# Patient Record
Sex: Female | Born: 1935 | Race: White | Hispanic: No | Marital: Married | Smoking: Former smoker
Health system: Southern US, Community
[De-identification: ages and names within clinical notes are randomized; demographics above are authoritative.]

## PROBLEM LIST (undated history)

## (undated) DIAGNOSIS — Z9989 Dependence on other enabling machines and devices: Secondary | ICD-10-CM

## (undated) DIAGNOSIS — G4733 Obstructive sleep apnea (adult) (pediatric): Secondary | ICD-10-CM

## (undated) DIAGNOSIS — F32A Depression, unspecified: Secondary | ICD-10-CM

## (undated) DIAGNOSIS — E039 Hypothyroidism, unspecified: Secondary | ICD-10-CM

## (undated) DIAGNOSIS — E871 Hypo-osmolality and hyponatremia: Secondary | ICD-10-CM

## (undated) DIAGNOSIS — S0230XD Fracture of orbital floor, unspecified side, subsequent encounter for fracture with routine healing: Secondary | ICD-10-CM

## (undated) DIAGNOSIS — M1990 Unspecified osteoarthritis, unspecified site: Secondary | ICD-10-CM

## (undated) DIAGNOSIS — K219 Gastro-esophageal reflux disease without esophagitis: Secondary | ICD-10-CM

## (undated) DIAGNOSIS — M75100 Unspecified rotator cuff tear or rupture of unspecified shoulder, not specified as traumatic: Secondary | ICD-10-CM

## (undated) DIAGNOSIS — I739 Peripheral vascular disease, unspecified: Secondary | ICD-10-CM

## (undated) DIAGNOSIS — H3530 Unspecified macular degeneration: Secondary | ICD-10-CM

## (undated) DIAGNOSIS — I499 Cardiac arrhythmia, unspecified: Secondary | ICD-10-CM

## (undated) DIAGNOSIS — I495 Sick sinus syndrome: Secondary | ICD-10-CM

## (undated) DIAGNOSIS — Z950 Presence of cardiac pacemaker: Secondary | ICD-10-CM

## (undated) DIAGNOSIS — I639 Cerebral infarction, unspecified: Secondary | ICD-10-CM

## (undated) DIAGNOSIS — E349 Endocrine disorder, unspecified: Secondary | ICD-10-CM

## (undated) DIAGNOSIS — R32 Unspecified urinary incontinence: Secondary | ICD-10-CM

## (undated) DIAGNOSIS — M810 Age-related osteoporosis without current pathological fracture: Secondary | ICD-10-CM

## (undated) DIAGNOSIS — Z860100 Personal history of colon polyps, unspecified: Secondary | ICD-10-CM

## (undated) DIAGNOSIS — F329 Major depressive disorder, single episode, unspecified: Secondary | ICD-10-CM

## (undated) DIAGNOSIS — I471 Supraventricular tachycardia, unspecified: Secondary | ICD-10-CM

## (undated) DIAGNOSIS — I4891 Unspecified atrial fibrillation: Secondary | ICD-10-CM

## (undated) DIAGNOSIS — S82899A Other fracture of unspecified lower leg, initial encounter for closed fracture: Secondary | ICD-10-CM

## (undated) DIAGNOSIS — R7989 Other specified abnormal findings of blood chemistry: Secondary | ICD-10-CM

## (undated) DIAGNOSIS — I10 Essential (primary) hypertension: Secondary | ICD-10-CM

## (undated) DIAGNOSIS — S62109A Fracture of unspecified carpal bone, unspecified wrist, initial encounter for closed fracture: Secondary | ICD-10-CM

## (undated) HISTORY — DX: Other fracture of unspecified lower leg, initial encounter for closed fracture: S82.899A

## (undated) HISTORY — DX: Depression, unspecified: F32.A

## (undated) HISTORY — DX: Essential (primary) hypertension: I10

## (undated) HISTORY — DX: Cerebral infarction, unspecified: I63.9

## (undated) HISTORY — DX: Sick sinus syndrome: I49.5

## (undated) HISTORY — PX: CATARACT EXTRACTION: SUR2

## (undated) HISTORY — DX: Supraventricular tachycardia, unspecified: I47.10

## (undated) HISTORY — DX: Other specified abnormal findings of blood chemistry: R79.89

## (undated) HISTORY — PX: BACK SURGERY: SHX140

## (undated) HISTORY — DX: Unspecified urinary incontinence: R32

## (undated) HISTORY — DX: Hypothyroidism, unspecified: E03.9

## (undated) HISTORY — PX: CATARACT EXTRACTION W/ INTRAOCULAR LENS  IMPLANT, BILATERAL: SHX1307

## (undated) HISTORY — DX: Endocrine disorder, unspecified: E34.9

## (undated) HISTORY — DX: Unspecified atrial fibrillation: I48.91

## (undated) HISTORY — DX: Supraventricular tachycardia: I47.1

## (undated) HISTORY — DX: Fracture of orbital floor, unspecified side, subsequent encounter for fracture with routine healing: S02.30XD

## (undated) HISTORY — DX: Unspecified osteoarthritis, unspecified site: M19.90

## (undated) HISTORY — DX: Gastro-esophageal reflux disease without esophagitis: K21.9

## (undated) HISTORY — PX: WRIST FRACTURE SURGERY: SHX121

## (undated) HISTORY — DX: Hypercalcemia: E83.52

## (undated) HISTORY — DX: Fracture of unspecified carpal bone, unspecified wrist, initial encounter for closed fracture: S62.109A

## (undated) HISTORY — PX: ESOPHAGOGASTRODUODENOSCOPY: SHX1529

## (undated) HISTORY — DX: Age-related osteoporosis without current pathological fracture: M81.0

## (undated) HISTORY — PX: COLONOSCOPY: SHX174

## (undated) HISTORY — DX: Unspecified rotator cuff tear or rupture of unspecified shoulder, not specified as traumatic: M75.100

## (undated) HISTORY — DX: Hypo-osmolality and hyponatremia: E87.1

## (undated) HISTORY — DX: Major depressive disorder, single episode, unspecified: F32.9

---

## 1968-07-22 HISTORY — PX: VAGINAL HYSTERECTOMY: SUR661

## 1999-08-11 HISTORY — PX: LAPAROSCOPIC CHOLECYSTECTOMY: SUR755

## 2010-05-23 LAB — HM MAMMOGRAPHY

## 2010-07-22 HISTORY — PX: PACEMAKER PLACEMENT: SHX43

## 2011-05-20 LAB — HM PAP SMEAR

## 2011-07-23 LAB — HM MAMMOGRAPHY

## 2011-07-23 LAB — HM PAP SMEAR: HM Pap smear: NORMAL

## 2011-07-23 LAB — HM COLONOSCOPY

## 2011-07-30 DIAGNOSIS — Z950 Presence of cardiac pacemaker: Secondary | ICD-10-CM | POA: Diagnosis not present

## 2011-07-30 DIAGNOSIS — I10 Essential (primary) hypertension: Secondary | ICD-10-CM | POA: Diagnosis not present

## 2011-07-30 DIAGNOSIS — I495 Sick sinus syndrome: Secondary | ICD-10-CM | POA: Diagnosis not present

## 2011-08-06 DIAGNOSIS — D1801 Hemangioma of skin and subcutaneous tissue: Secondary | ICD-10-CM | POA: Diagnosis not present

## 2011-08-06 DIAGNOSIS — D485 Neoplasm of uncertain behavior of skin: Secondary | ICD-10-CM | POA: Diagnosis not present

## 2011-08-06 DIAGNOSIS — L820 Inflamed seborrheic keratosis: Secondary | ICD-10-CM | POA: Diagnosis not present

## 2011-08-06 DIAGNOSIS — C44621 Squamous cell carcinoma of skin of unspecified upper limb, including shoulder: Secondary | ICD-10-CM | POA: Diagnosis not present

## 2011-08-08 DIAGNOSIS — I4891 Unspecified atrial fibrillation: Secondary | ICD-10-CM | POA: Diagnosis not present

## 2011-08-08 DIAGNOSIS — Z950 Presence of cardiac pacemaker: Secondary | ICD-10-CM | POA: Diagnosis not present

## 2011-08-13 DIAGNOSIS — C44621 Squamous cell carcinoma of skin of unspecified upper limb, including shoulder: Secondary | ICD-10-CM | POA: Diagnosis not present

## 2011-08-13 DIAGNOSIS — C4492 Squamous cell carcinoma of skin, unspecified: Secondary | ICD-10-CM | POA: Diagnosis not present

## 2011-08-15 DIAGNOSIS — N3941 Urge incontinence: Secondary | ICD-10-CM | POA: Diagnosis not present

## 2011-08-19 DIAGNOSIS — T8189XA Other complications of procedures, not elsewhere classified, initial encounter: Secondary | ICD-10-CM | POA: Diagnosis not present

## 2011-08-22 DIAGNOSIS — H35369 Drusen (degenerative) of macula, unspecified eye: Secondary | ICD-10-CM | POA: Diagnosis not present

## 2011-08-22 DIAGNOSIS — H359 Unspecified retinal disorder: Secondary | ICD-10-CM | POA: Diagnosis not present

## 2011-08-22 DIAGNOSIS — H353190 Nonexudative age-related macular degeneration, unspecified eye, stage unspecified: Secondary | ICD-10-CM | POA: Diagnosis not present

## 2011-08-27 DIAGNOSIS — I10 Essential (primary) hypertension: Secondary | ICD-10-CM | POA: Diagnosis not present

## 2011-08-27 DIAGNOSIS — I495 Sick sinus syndrome: Secondary | ICD-10-CM | POA: Diagnosis not present

## 2011-08-27 DIAGNOSIS — I4891 Unspecified atrial fibrillation: Secondary | ICD-10-CM | POA: Diagnosis not present

## 2011-09-12 DIAGNOSIS — H353290 Exudative age-related macular degeneration, unspecified eye, stage unspecified: Secondary | ICD-10-CM | POA: Diagnosis not present

## 2011-09-23 DIAGNOSIS — H353290 Exudative age-related macular degeneration, unspecified eye, stage unspecified: Secondary | ICD-10-CM | POA: Diagnosis not present

## 2011-10-24 DIAGNOSIS — H353290 Exudative age-related macular degeneration, unspecified eye, stage unspecified: Secondary | ICD-10-CM | POA: Diagnosis not present

## 2011-10-31 DIAGNOSIS — E785 Hyperlipidemia, unspecified: Secondary | ICD-10-CM | POA: Diagnosis not present

## 2011-10-31 DIAGNOSIS — Z0000 Encounter for general adult medical examination without abnormal findings: Secondary | ICD-10-CM | POA: Diagnosis not present

## 2011-10-31 DIAGNOSIS — E039 Hypothyroidism, unspecified: Secondary | ICD-10-CM | POA: Diagnosis not present

## 2011-10-31 DIAGNOSIS — I479 Paroxysmal tachycardia, unspecified: Secondary | ICD-10-CM | POA: Diagnosis not present

## 2011-10-31 DIAGNOSIS — IMO0002 Reserved for concepts with insufficient information to code with codable children: Secondary | ICD-10-CM | POA: Diagnosis not present

## 2011-10-31 DIAGNOSIS — I10 Essential (primary) hypertension: Secondary | ICD-10-CM | POA: Diagnosis not present

## 2011-10-31 DIAGNOSIS — Z79899 Other long term (current) drug therapy: Secondary | ICD-10-CM | POA: Diagnosis not present

## 2011-10-31 DIAGNOSIS — F411 Generalized anxiety disorder: Secondary | ICD-10-CM | POA: Diagnosis not present

## 2011-10-31 LAB — BASIC METABOLIC PANEL
BUN, Bld: 16
CO2, blood: 29.4
Calcium: 10.4 mg/dL
Chloride: 100 mmol/L
Creat: 0.52
GFR: 115
Glucose: 99
Potassium: 4.4 mmol/L
Sodium: 135 mmol/L — AB (ref 137–147)

## 2011-10-31 LAB — TSH: TSH: 1.72

## 2011-11-05 DIAGNOSIS — D1801 Hemangioma of skin and subcutaneous tissue: Secondary | ICD-10-CM | POA: Diagnosis not present

## 2011-11-05 DIAGNOSIS — L820 Inflamed seborrheic keratosis: Secondary | ICD-10-CM | POA: Diagnosis not present

## 2011-11-05 DIAGNOSIS — L570 Actinic keratosis: Secondary | ICD-10-CM | POA: Diagnosis not present

## 2011-11-07 DIAGNOSIS — Z950 Presence of cardiac pacemaker: Secondary | ICD-10-CM | POA: Diagnosis not present

## 2011-11-07 DIAGNOSIS — I4891 Unspecified atrial fibrillation: Secondary | ICD-10-CM | POA: Diagnosis not present

## 2011-11-08 DIAGNOSIS — I4891 Unspecified atrial fibrillation: Secondary | ICD-10-CM | POA: Diagnosis not present

## 2011-11-08 DIAGNOSIS — Z950 Presence of cardiac pacemaker: Secondary | ICD-10-CM | POA: Diagnosis not present

## 2011-12-03 DIAGNOSIS — H353290 Exudative age-related macular degeneration, unspecified eye, stage unspecified: Secondary | ICD-10-CM | POA: Diagnosis not present

## 2012-01-06 DIAGNOSIS — I4891 Unspecified atrial fibrillation: Secondary | ICD-10-CM | POA: Diagnosis not present

## 2012-01-06 DIAGNOSIS — Z950 Presence of cardiac pacemaker: Secondary | ICD-10-CM | POA: Diagnosis not present

## 2012-01-07 DIAGNOSIS — H353290 Exudative age-related macular degeneration, unspecified eye, stage unspecified: Secondary | ICD-10-CM | POA: Diagnosis not present

## 2012-01-07 DIAGNOSIS — I4891 Unspecified atrial fibrillation: Secondary | ICD-10-CM | POA: Diagnosis not present

## 2012-01-07 DIAGNOSIS — Z950 Presence of cardiac pacemaker: Secondary | ICD-10-CM | POA: Diagnosis not present

## 2012-03-09 DIAGNOSIS — M5137 Other intervertebral disc degeneration, lumbosacral region: Secondary | ICD-10-CM | POA: Diagnosis not present

## 2012-03-09 DIAGNOSIS — M999 Biomechanical lesion, unspecified: Secondary | ICD-10-CM | POA: Diagnosis not present

## 2012-03-09 DIAGNOSIS — M9981 Other biomechanical lesions of cervical region: Secondary | ICD-10-CM | POA: Diagnosis not present

## 2012-03-11 DIAGNOSIS — M5137 Other intervertebral disc degeneration, lumbosacral region: Secondary | ICD-10-CM | POA: Diagnosis not present

## 2012-03-11 DIAGNOSIS — M9981 Other biomechanical lesions of cervical region: Secondary | ICD-10-CM | POA: Diagnosis not present

## 2012-03-11 DIAGNOSIS — M999 Biomechanical lesion, unspecified: Secondary | ICD-10-CM | POA: Diagnosis not present

## 2012-03-13 DIAGNOSIS — M999 Biomechanical lesion, unspecified: Secondary | ICD-10-CM | POA: Diagnosis not present

## 2012-03-13 DIAGNOSIS — M9981 Other biomechanical lesions of cervical region: Secondary | ICD-10-CM | POA: Diagnosis not present

## 2012-03-13 DIAGNOSIS — M5137 Other intervertebral disc degeneration, lumbosacral region: Secondary | ICD-10-CM | POA: Diagnosis not present

## 2012-03-16 DIAGNOSIS — IMO0002 Reserved for concepts with insufficient information to code with codable children: Secondary | ICD-10-CM | POA: Diagnosis not present

## 2012-03-16 DIAGNOSIS — R1310 Dysphagia, unspecified: Secondary | ICD-10-CM | POA: Diagnosis not present

## 2012-03-16 DIAGNOSIS — E039 Hypothyroidism, unspecified: Secondary | ICD-10-CM | POA: Diagnosis not present

## 2012-03-16 DIAGNOSIS — M9981 Other biomechanical lesions of cervical region: Secondary | ICD-10-CM | POA: Diagnosis not present

## 2012-03-16 DIAGNOSIS — M999 Biomechanical lesion, unspecified: Secondary | ICD-10-CM | POA: Diagnosis not present

## 2012-03-16 DIAGNOSIS — M5137 Other intervertebral disc degeneration, lumbosacral region: Secondary | ICD-10-CM | POA: Diagnosis not present

## 2012-03-18 DIAGNOSIS — M999 Biomechanical lesion, unspecified: Secondary | ICD-10-CM | POA: Diagnosis not present

## 2012-03-18 DIAGNOSIS — M9981 Other biomechanical lesions of cervical region: Secondary | ICD-10-CM | POA: Diagnosis not present

## 2012-03-18 DIAGNOSIS — M5137 Other intervertebral disc degeneration, lumbosacral region: Secondary | ICD-10-CM | POA: Diagnosis not present

## 2012-03-19 DIAGNOSIS — R1310 Dysphagia, unspecified: Secondary | ICD-10-CM | POA: Diagnosis not present

## 2012-03-20 DIAGNOSIS — M9981 Other biomechanical lesions of cervical region: Secondary | ICD-10-CM | POA: Diagnosis not present

## 2012-03-20 DIAGNOSIS — M5137 Other intervertebral disc degeneration, lumbosacral region: Secondary | ICD-10-CM | POA: Diagnosis not present

## 2012-03-20 DIAGNOSIS — M999 Biomechanical lesion, unspecified: Secondary | ICD-10-CM | POA: Diagnosis not present

## 2012-03-24 DIAGNOSIS — I498 Other specified cardiac arrhythmias: Secondary | ICD-10-CM | POA: Diagnosis not present

## 2012-03-24 DIAGNOSIS — I4891 Unspecified atrial fibrillation: Secondary | ICD-10-CM | POA: Diagnosis not present

## 2012-03-24 DIAGNOSIS — I10 Essential (primary) hypertension: Secondary | ICD-10-CM | POA: Diagnosis not present

## 2012-03-26 DIAGNOSIS — M5137 Other intervertebral disc degeneration, lumbosacral region: Secondary | ICD-10-CM | POA: Diagnosis not present

## 2012-03-26 DIAGNOSIS — M48061 Spinal stenosis, lumbar region without neurogenic claudication: Secondary | ICD-10-CM | POA: Diagnosis not present

## 2012-03-26 DIAGNOSIS — M549 Dorsalgia, unspecified: Secondary | ICD-10-CM | POA: Diagnosis not present

## 2012-03-26 DIAGNOSIS — M5380 Other specified dorsopathies, site unspecified: Secondary | ICD-10-CM | POA: Diagnosis not present

## 2012-03-27 DIAGNOSIS — M9981 Other biomechanical lesions of cervical region: Secondary | ICD-10-CM | POA: Diagnosis not present

## 2012-03-27 DIAGNOSIS — M999 Biomechanical lesion, unspecified: Secondary | ICD-10-CM | POA: Diagnosis not present

## 2012-03-27 DIAGNOSIS — M5137 Other intervertebral disc degeneration, lumbosacral region: Secondary | ICD-10-CM | POA: Diagnosis not present

## 2012-03-28 DIAGNOSIS — Z23 Encounter for immunization: Secondary | ICD-10-CM | POA: Diagnosis not present

## 2012-03-31 DIAGNOSIS — H353290 Exudative age-related macular degeneration, unspecified eye, stage unspecified: Secondary | ICD-10-CM | POA: Diagnosis not present

## 2012-04-01 DIAGNOSIS — M47817 Spondylosis without myelopathy or radiculopathy, lumbosacral region: Secondary | ICD-10-CM | POA: Diagnosis not present

## 2012-04-02 DIAGNOSIS — Z86010 Personal history of colonic polyps: Secondary | ICD-10-CM | POA: Diagnosis not present

## 2012-04-02 DIAGNOSIS — R1310 Dysphagia, unspecified: Secondary | ICD-10-CM | POA: Diagnosis not present

## 2012-04-09 DIAGNOSIS — I495 Sick sinus syndrome: Secondary | ICD-10-CM | POA: Diagnosis not present

## 2012-04-09 DIAGNOSIS — Z950 Presence of cardiac pacemaker: Secondary | ICD-10-CM | POA: Diagnosis not present

## 2012-04-16 DIAGNOSIS — Z86010 Personal history of colonic polyps: Secondary | ICD-10-CM | POA: Diagnosis not present

## 2012-04-16 DIAGNOSIS — K5730 Diverticulosis of large intestine without perforation or abscess without bleeding: Secondary | ICD-10-CM | POA: Diagnosis not present

## 2012-04-16 DIAGNOSIS — I10 Essential (primary) hypertension: Secondary | ICD-10-CM | POA: Diagnosis not present

## 2012-04-16 DIAGNOSIS — D126 Benign neoplasm of colon, unspecified: Secondary | ICD-10-CM | POA: Diagnosis not present

## 2012-04-16 DIAGNOSIS — Z800 Family history of malignant neoplasm of digestive organs: Secondary | ICD-10-CM | POA: Diagnosis not present

## 2012-05-29 DIAGNOSIS — Z1231 Encounter for screening mammogram for malignant neoplasm of breast: Secondary | ICD-10-CM | POA: Diagnosis not present

## 2012-06-02 DIAGNOSIS — H353290 Exudative age-related macular degeneration, unspecified eye, stage unspecified: Secondary | ICD-10-CM | POA: Diagnosis not present

## 2012-06-09 DIAGNOSIS — I4891 Unspecified atrial fibrillation: Secondary | ICD-10-CM | POA: Diagnosis not present

## 2012-06-09 DIAGNOSIS — I10 Essential (primary) hypertension: Secondary | ICD-10-CM | POA: Diagnosis not present

## 2012-06-09 DIAGNOSIS — Z950 Presence of cardiac pacemaker: Secondary | ICD-10-CM | POA: Diagnosis not present

## 2012-06-23 DIAGNOSIS — R1310 Dysphagia, unspecified: Secondary | ICD-10-CM | POA: Diagnosis not present

## 2012-06-23 DIAGNOSIS — K222 Esophageal obstruction: Secondary | ICD-10-CM | POA: Diagnosis not present

## 2012-07-01 DIAGNOSIS — D239 Other benign neoplasm of skin, unspecified: Secondary | ICD-10-CM | POA: Diagnosis not present

## 2012-07-01 DIAGNOSIS — L570 Actinic keratosis: Secondary | ICD-10-CM | POA: Diagnosis not present

## 2012-07-01 DIAGNOSIS — D692 Other nonthrombocytopenic purpura: Secondary | ICD-10-CM | POA: Diagnosis not present

## 2012-07-01 DIAGNOSIS — L821 Other seborrheic keratosis: Secondary | ICD-10-CM | POA: Diagnosis not present

## 2012-07-01 DIAGNOSIS — D1801 Hemangioma of skin and subcutaneous tissue: Secondary | ICD-10-CM | POA: Diagnosis not present

## 2012-07-21 DIAGNOSIS — K222 Esophageal obstruction: Secondary | ICD-10-CM | POA: Diagnosis not present

## 2012-07-23 DIAGNOSIS — R5381 Other malaise: Secondary | ICD-10-CM | POA: Diagnosis not present

## 2012-07-23 DIAGNOSIS — K222 Esophageal obstruction: Secondary | ICD-10-CM | POA: Diagnosis not present

## 2012-07-23 DIAGNOSIS — Z950 Presence of cardiac pacemaker: Secondary | ICD-10-CM | POA: Diagnosis not present

## 2012-07-23 DIAGNOSIS — E039 Hypothyroidism, unspecified: Secondary | ICD-10-CM | POA: Diagnosis not present

## 2012-07-23 DIAGNOSIS — I4891 Unspecified atrial fibrillation: Secondary | ICD-10-CM | POA: Diagnosis not present

## 2012-07-23 DIAGNOSIS — R0609 Other forms of dyspnea: Secondary | ICD-10-CM | POA: Diagnosis not present

## 2012-07-23 DIAGNOSIS — Z7901 Long term (current) use of anticoagulants: Secondary | ICD-10-CM | POA: Diagnosis not present

## 2012-07-23 DIAGNOSIS — R0989 Other specified symptoms and signs involving the circulatory and respiratory systems: Secondary | ICD-10-CM | POA: Diagnosis not present

## 2012-07-23 DIAGNOSIS — IMO0002 Reserved for concepts with insufficient information to code with codable children: Secondary | ICD-10-CM | POA: Diagnosis not present

## 2012-07-23 DIAGNOSIS — I10 Essential (primary) hypertension: Secondary | ICD-10-CM | POA: Diagnosis not present

## 2012-07-23 DIAGNOSIS — I479 Paroxysmal tachycardia, unspecified: Secondary | ICD-10-CM | POA: Diagnosis not present

## 2012-07-24 DIAGNOSIS — Z13220 Encounter for screening for lipoid disorders: Secondary | ICD-10-CM | POA: Diagnosis not present

## 2012-07-24 DIAGNOSIS — Z79899 Other long term (current) drug therapy: Secondary | ICD-10-CM | POA: Diagnosis not present

## 2012-07-24 DIAGNOSIS — E039 Hypothyroidism, unspecified: Secondary | ICD-10-CM | POA: Diagnosis not present

## 2012-07-24 DIAGNOSIS — R5381 Other malaise: Secondary | ICD-10-CM | POA: Diagnosis not present

## 2012-07-24 DIAGNOSIS — I10 Essential (primary) hypertension: Secondary | ICD-10-CM | POA: Diagnosis not present

## 2012-07-24 DIAGNOSIS — Z0000 Encounter for general adult medical examination without abnormal findings: Secondary | ICD-10-CM | POA: Diagnosis not present

## 2012-07-24 LAB — CHG T CELL ABSOLUTE COUNT
Basophils Absolute: 0 /uL
Eosinophils Absolute: 1 /uL
Lymphocytes absolute: 1.1 10*3/uL (ref 0.1–1.8)
Monocyes absolute: 0.6 10*3/uL (ref 0.1–1)
Neutrophils absolute (GR#): 1.7 10*3/uL (ref 1.7–7.8)

## 2012-07-24 LAB — CBC
Hematocrit: 39.5 % (ref 34.8–46)
Hemoglobin POC: 13.4 g/dL (ref 12–15)
MCH: 33.1
MCHC: 33.9
MCV: 97.5 fL (ref 78–100)
MPV: 7.6 fL — AB (ref 7.8–11)
Platelets: 273 10*3/uL
RBC: 4.05
RDW: 12.8
WBC: 3.8

## 2012-07-24 LAB — COMPREHENSIVE METABOLIC PANEL
ALT: 16 U/L (ref 7–35)
AST: 19 U/L
Albumin: 3.8
Alkaline Phosphatase: 63 U/L
BUN, Bld: 13
Bilirubin, Total: 0.8
CO2, blood: 31.7
Calcium: 10.2 mg/dL
Chloride: 102 mmol/L
Creat: 0.47
GFR: 129
Glucose: 95
Potassium: 4.6 mmol/L
Protein S Total: 6.2
Sodium: 138 mmol/L (ref 137–147)

## 2012-07-24 LAB — CHG COMPLETE CBC & AUTO DIFF WBC
Basos: 1.3
Eos: 8.2
Lymphs: 28.8
Monocytes: 15.8
Neutrophils Relative %: 46 % (ref 46–78)
nRBC: 0 /100 (ref 0–2)

## 2012-07-24 LAB — LIPID PANEL
Chol/HDL Ratio: 3
Cholesterol: 242 mg/dL — AB (ref 0–200)
HDL: 96 mg/dL — AB (ref 35–70)
LDL Cholesterol: 135 mg/dL
Triglycerides: 59
VLDL: 12 mg/dL

## 2012-07-24 LAB — TSH: TSH: 2.5

## 2012-07-28 DIAGNOSIS — I495 Sick sinus syndrome: Secondary | ICD-10-CM | POA: Diagnosis not present

## 2012-07-28 DIAGNOSIS — Z950 Presence of cardiac pacemaker: Secondary | ICD-10-CM | POA: Diagnosis not present

## 2012-07-28 DIAGNOSIS — I10 Essential (primary) hypertension: Secondary | ICD-10-CM | POA: Diagnosis not present

## 2012-07-28 DIAGNOSIS — I4891 Unspecified atrial fibrillation: Secondary | ICD-10-CM | POA: Diagnosis not present

## 2012-07-30 DIAGNOSIS — R0609 Other forms of dyspnea: Secondary | ICD-10-CM | POA: Diagnosis not present

## 2012-07-30 DIAGNOSIS — R0989 Other specified symptoms and signs involving the circulatory and respiratory systems: Secondary | ICD-10-CM | POA: Diagnosis not present

## 2012-07-30 DIAGNOSIS — G4733 Obstructive sleep apnea (adult) (pediatric): Secondary | ICD-10-CM | POA: Diagnosis not present

## 2012-07-30 DIAGNOSIS — G4763 Sleep related bruxism: Secondary | ICD-10-CM | POA: Diagnosis not present

## 2012-07-30 DIAGNOSIS — G4730 Sleep apnea, unspecified: Secondary | ICD-10-CM | POA: Diagnosis not present

## 2012-07-30 DIAGNOSIS — R350 Frequency of micturition: Secondary | ICD-10-CM | POA: Diagnosis not present

## 2012-08-13 DIAGNOSIS — R0989 Other specified symptoms and signs involving the circulatory and respiratory systems: Secondary | ICD-10-CM | POA: Diagnosis not present

## 2012-08-13 DIAGNOSIS — G4763 Sleep related bruxism: Secondary | ICD-10-CM | POA: Diagnosis not present

## 2012-08-13 DIAGNOSIS — R0609 Other forms of dyspnea: Secondary | ICD-10-CM | POA: Diagnosis not present

## 2012-08-13 DIAGNOSIS — G4733 Obstructive sleep apnea (adult) (pediatric): Secondary | ICD-10-CM | POA: Diagnosis not present

## 2012-08-13 DIAGNOSIS — G4730 Sleep apnea, unspecified: Secondary | ICD-10-CM | POA: Diagnosis not present

## 2012-08-17 DIAGNOSIS — H353290 Exudative age-related macular degeneration, unspecified eye, stage unspecified: Secondary | ICD-10-CM | POA: Diagnosis not present

## 2012-08-19 DIAGNOSIS — G4733 Obstructive sleep apnea (adult) (pediatric): Secondary | ICD-10-CM | POA: Diagnosis not present

## 2012-10-01 DIAGNOSIS — H353290 Exudative age-related macular degeneration, unspecified eye, stage unspecified: Secondary | ICD-10-CM | POA: Diagnosis not present

## 2012-10-22 DIAGNOSIS — Z950 Presence of cardiac pacemaker: Secondary | ICD-10-CM | POA: Diagnosis not present

## 2012-10-22 DIAGNOSIS — I4891 Unspecified atrial fibrillation: Secondary | ICD-10-CM | POA: Diagnosis not present

## 2012-10-26 DIAGNOSIS — Z950 Presence of cardiac pacemaker: Secondary | ICD-10-CM | POA: Diagnosis not present

## 2012-10-26 DIAGNOSIS — I4891 Unspecified atrial fibrillation: Secondary | ICD-10-CM | POA: Diagnosis not present

## 2012-10-28 DIAGNOSIS — Z950 Presence of cardiac pacemaker: Secondary | ICD-10-CM | POA: Diagnosis not present

## 2012-10-28 DIAGNOSIS — G4730 Sleep apnea, unspecified: Secondary | ICD-10-CM | POA: Diagnosis not present

## 2012-10-28 DIAGNOSIS — I4891 Unspecified atrial fibrillation: Secondary | ICD-10-CM | POA: Diagnosis not present

## 2012-10-28 DIAGNOSIS — Z79899 Other long term (current) drug therapy: Secondary | ICD-10-CM | POA: Diagnosis not present

## 2012-10-28 DIAGNOSIS — E039 Hypothyroidism, unspecified: Secondary | ICD-10-CM | POA: Diagnosis not present

## 2012-10-28 DIAGNOSIS — IMO0002 Reserved for concepts with insufficient information to code with codable children: Secondary | ICD-10-CM | POA: Diagnosis not present

## 2012-10-28 DIAGNOSIS — E785 Hyperlipidemia, unspecified: Secondary | ICD-10-CM | POA: Diagnosis not present

## 2012-10-28 DIAGNOSIS — I10 Essential (primary) hypertension: Secondary | ICD-10-CM | POA: Diagnosis not present

## 2012-10-28 DIAGNOSIS — Z0000 Encounter for general adult medical examination without abnormal findings: Secondary | ICD-10-CM | POA: Diagnosis not present

## 2012-10-28 LAB — LIPID PANEL
Chol/HDL Ratio: 2
Chol/HDL Ratio: 2
Cholesterol: 241 mg/dL — AB (ref 0–200)
Cholesterol: 241 mg/dL — AB (ref 0–200)
HDL: 103 mg/dL — AB (ref 35–70)
HDL: 103 mg/dL — AB (ref 35–70)
LDL Cholesterol: 124 mg/dL
LDL Cholesterol: 124 mg/dL
Triglycerides: 69
Triglycerides: 69
VLDL: 14 mg/dL
VLDL: 14 mg/dL

## 2012-10-28 LAB — CHG T CELL ABSOLUTE COUNT
Basophils Absolute: 0 /uL
Basophils Absolute: 0 /uL
Eosinophils Absolute: 0 /uL
Eosinophils Absolute: 0 /uL
Lymphocytes Absolute: 1 /uL
Lymphocytes absolute: 1 10*3/uL (ref 0.1–1.8)
Monocyes absolute: 0.8 10*3/uL (ref 0.1–1)
Monocytes Absolute: 1 /uL
Neutrophils Absolute: 3 /uL
Neutrophils absolute (GR#): 2.6 10*3/uL (ref 1.7–7.8)

## 2012-10-28 LAB — COMPREHENSIVE METABOLIC PANEL
ALT: 18 U/L (ref 7–35)
ALT: 18 U/L (ref 7–35)
AST: 20 U/L
AST: 20 U/L
Albumin: 4.2
Albumin: 4.2
Alkaline Phosphatase: 79 U/L
Alkaline Phosphatase: 79 U/L
BUN, Bld: 9
BUN: 9 mg/dL (ref 4–21)
CO2, blood: 31.6
CO2, blood: 31.6
Calcium: 10.4 mg/dL
Calcium: 10.4 mg/dL
Chloride: 100 mmol/L
Chloride: 100 mmol/L (ref 99–108)
Creat: 0.46
Creat: 0.46
GFR: 132
Glucose: 105
Glucose: 105
Potassium: 3.9 mmol/L
Potassium: 3.9 mmol/L
Sodium: 137 mmol/L (ref 137–147)
Sodium: 137 mmol/L (ref 137–147)
Total Bilirubin: 1.1 mg/dL
Total Bilirubin: 1.1 mg/dL
Total Protein: 6.9 g/dL
Total Protein: 6.9 g/dL

## 2012-10-28 LAB — CBC
HCT, POC: 43.3 % (ref 37.7–47.9)
HGB: 14.5 g/dL
Hematocrit: 43.3 % (ref 34.8–46)
Hemoglobin: 14.5 g/dL (ref 11.8–15.5)
MCH: 31.7
MCH: 31.7
MCHC: 33.5
MCHC: 33.5
MCV: 94.7 fL (ref 78–100)
MCV: 94.7 fL (ref 78–100)
MPV: 7.3 fL (ref 0–99.8)
MPV: 7.3 fL — AB (ref 7.8–11)
Platelet count: 263 10*3/uL (ref 140–400)
Platelets: 263
RBC: 4.57
RBC: 4.57
RDW: 13.5
RDW: 13.5
WBC: 4.5
WBC: 4.5

## 2012-10-28 LAB — CHG GENERAL HEALTH PANEL
Basophils Absolute: 1 /uL
Eos: 3.7
GFR: 132
Lymphocytes, automated: 22
Monocytes: 16.8
Neutrophils Relative %: 57 % (ref 46–78)
nRBC: 0 /100 (ref 0–2)

## 2012-10-28 LAB — AMB REFERRAL TO INFERTILITY: TSH: 1.3

## 2012-10-28 LAB — TSH: TSH: 1.3

## 2012-11-02 DIAGNOSIS — R32 Unspecified urinary incontinence: Secondary | ICD-10-CM | POA: Diagnosis not present

## 2012-11-02 DIAGNOSIS — N952 Postmenopausal atrophic vaginitis: Secondary | ICD-10-CM | POA: Diagnosis not present

## 2012-11-03 DIAGNOSIS — M48061 Spinal stenosis, lumbar region without neurogenic claudication: Secondary | ICD-10-CM | POA: Diagnosis not present

## 2012-11-03 DIAGNOSIS — I10 Essential (primary) hypertension: Secondary | ICD-10-CM | POA: Diagnosis not present

## 2012-11-03 DIAGNOSIS — M549 Dorsalgia, unspecified: Secondary | ICD-10-CM | POA: Diagnosis not present

## 2012-11-03 DIAGNOSIS — R229 Localized swelling, mass and lump, unspecified: Secondary | ICD-10-CM | POA: Diagnosis not present

## 2012-11-03 DIAGNOSIS — M5380 Other specified dorsopathies, site unspecified: Secondary | ICD-10-CM | POA: Diagnosis not present

## 2012-11-03 DIAGNOSIS — D2120 Benign neoplasm of connective and other soft tissue of unspecified lower limb, including hip: Secondary | ICD-10-CM | POA: Diagnosis not present

## 2012-11-03 DIAGNOSIS — M799 Soft tissue disorder, unspecified: Secondary | ICD-10-CM | POA: Diagnosis not present

## 2012-11-03 DIAGNOSIS — I2789 Other specified pulmonary heart diseases: Secondary | ICD-10-CM | POA: Diagnosis not present

## 2012-11-03 DIAGNOSIS — Z01811 Encounter for preprocedural respiratory examination: Secondary | ICD-10-CM | POA: Diagnosis not present

## 2012-11-03 DIAGNOSIS — I4891 Unspecified atrial fibrillation: Secondary | ICD-10-CM | POA: Diagnosis not present

## 2012-11-03 DIAGNOSIS — M5137 Other intervertebral disc degeneration, lumbosacral region: Secondary | ICD-10-CM | POA: Diagnosis not present

## 2012-11-03 DIAGNOSIS — I2510 Atherosclerotic heart disease of native coronary artery without angina pectoris: Secondary | ICD-10-CM | POA: Diagnosis not present

## 2012-11-04 DIAGNOSIS — D2120 Benign neoplasm of connective and other soft tissue of unspecified lower limb, including hip: Secondary | ICD-10-CM | POA: Diagnosis not present

## 2012-11-04 DIAGNOSIS — I4891 Unspecified atrial fibrillation: Secondary | ICD-10-CM | POA: Diagnosis not present

## 2012-11-04 DIAGNOSIS — R229 Localized swelling, mass and lump, unspecified: Secondary | ICD-10-CM | POA: Diagnosis not present

## 2012-11-04 DIAGNOSIS — M799 Soft tissue disorder, unspecified: Secondary | ICD-10-CM | POA: Diagnosis not present

## 2012-11-04 DIAGNOSIS — I10 Essential (primary) hypertension: Secondary | ICD-10-CM | POA: Diagnosis not present

## 2012-11-10 DIAGNOSIS — Z950 Presence of cardiac pacemaker: Secondary | ICD-10-CM | POA: Diagnosis not present

## 2012-11-10 DIAGNOSIS — I495 Sick sinus syndrome: Secondary | ICD-10-CM | POA: Diagnosis not present

## 2012-11-10 DIAGNOSIS — I10 Essential (primary) hypertension: Secondary | ICD-10-CM | POA: Diagnosis not present

## 2012-11-25 DIAGNOSIS — M47817 Spondylosis without myelopathy or radiculopathy, lumbosacral region: Secondary | ICD-10-CM | POA: Diagnosis not present

## 2012-11-26 DIAGNOSIS — H353290 Exudative age-related macular degeneration, unspecified eye, stage unspecified: Secondary | ICD-10-CM | POA: Diagnosis not present

## 2012-11-27 DIAGNOSIS — K222 Esophageal obstruction: Secondary | ICD-10-CM | POA: Diagnosis not present

## 2012-11-27 DIAGNOSIS — I10 Essential (primary) hypertension: Secondary | ICD-10-CM | POA: Diagnosis not present

## 2012-11-27 DIAGNOSIS — K2940 Chronic atrophic gastritis without bleeding: Secondary | ICD-10-CM | POA: Diagnosis not present

## 2012-11-27 DIAGNOSIS — K319 Disease of stomach and duodenum, unspecified: Secondary | ICD-10-CM | POA: Diagnosis not present

## 2012-11-27 DIAGNOSIS — K209 Esophagitis, unspecified without bleeding: Secondary | ICD-10-CM | POA: Diagnosis not present

## 2012-11-27 DIAGNOSIS — R1310 Dysphagia, unspecified: Secondary | ICD-10-CM | POA: Diagnosis not present

## 2012-11-27 DIAGNOSIS — K228 Other specified diseases of esophagus: Secondary | ICD-10-CM | POA: Diagnosis not present

## 2012-11-27 DIAGNOSIS — K2289 Other specified disease of esophagus: Secondary | ICD-10-CM | POA: Diagnosis not present

## 2012-11-30 DIAGNOSIS — Z111 Encounter for screening for respiratory tuberculosis: Secondary | ICD-10-CM | POA: Diagnosis not present

## 2013-01-01 DIAGNOSIS — Y92009 Unspecified place in unspecified non-institutional (private) residence as the place of occurrence of the external cause: Secondary | ICD-10-CM | POA: Diagnosis not present

## 2013-01-01 DIAGNOSIS — W19XXXA Unspecified fall, initial encounter: Secondary | ICD-10-CM | POA: Diagnosis not present

## 2013-01-01 DIAGNOSIS — S8000XA Contusion of unspecified knee, initial encounter: Secondary | ICD-10-CM | POA: Diagnosis not present

## 2013-01-01 DIAGNOSIS — IMO0002 Reserved for concepts with insufficient information to code with codable children: Secondary | ICD-10-CM | POA: Diagnosis not present

## 2013-01-13 DIAGNOSIS — M654 Radial styloid tenosynovitis [de Quervain]: Secondary | ICD-10-CM | POA: Diagnosis not present

## 2013-01-21 DIAGNOSIS — S59909A Unspecified injury of unspecified elbow, initial encounter: Secondary | ICD-10-CM | POA: Diagnosis not present

## 2013-01-21 DIAGNOSIS — S6990XA Unspecified injury of unspecified wrist, hand and finger(s), initial encounter: Secondary | ICD-10-CM | POA: Diagnosis not present

## 2013-01-21 DIAGNOSIS — S63509A Unspecified sprain of unspecified wrist, initial encounter: Secondary | ICD-10-CM | POA: Diagnosis not present

## 2013-02-23 ENCOUNTER — Encounter: Payer: Self-pay | Admitting: Nurse Practitioner

## 2013-02-23 ENCOUNTER — Other Ambulatory Visit: Payer: Self-pay | Admitting: Nurse Practitioner

## 2013-02-23 ENCOUNTER — Ambulatory Visit (INDEPENDENT_AMBULATORY_CARE_PROVIDER_SITE_OTHER): Payer: Medicare Other | Admitting: Nurse Practitioner

## 2013-02-23 VITALS — BP 112/60 | HR 79 | Temp 98.0°F | Ht 66.17 in | Wt 157.8 lb

## 2013-02-23 DIAGNOSIS — E2839 Other primary ovarian failure: Secondary | ICD-10-CM

## 2013-02-23 DIAGNOSIS — I10 Essential (primary) hypertension: Secondary | ICD-10-CM

## 2013-02-23 DIAGNOSIS — F32A Depression, unspecified: Secondary | ICD-10-CM | POA: Insufficient documentation

## 2013-02-23 DIAGNOSIS — K219 Gastro-esophageal reflux disease without esophagitis: Secondary | ICD-10-CM

## 2013-02-23 DIAGNOSIS — F329 Major depressive disorder, single episode, unspecified: Secondary | ICD-10-CM

## 2013-02-23 DIAGNOSIS — F419 Anxiety disorder, unspecified: Secondary | ICD-10-CM | POA: Insufficient documentation

## 2013-02-23 DIAGNOSIS — Z9989 Dependence on other enabling machines and devices: Secondary | ICD-10-CM | POA: Insufficient documentation

## 2013-02-23 DIAGNOSIS — Z780 Asymptomatic menopausal state: Secondary | ICD-10-CM

## 2013-02-23 DIAGNOSIS — E039 Hypothyroidism, unspecified: Secondary | ICD-10-CM | POA: Insufficient documentation

## 2013-02-23 DIAGNOSIS — Z0000 Encounter for general adult medical examination without abnormal findings: Secondary | ICD-10-CM

## 2013-02-23 DIAGNOSIS — K2950 Unspecified chronic gastritis without bleeding: Secondary | ICD-10-CM | POA: Insufficient documentation

## 2013-02-23 DIAGNOSIS — R32 Unspecified urinary incontinence: Secondary | ICD-10-CM

## 2013-02-23 DIAGNOSIS — G4733 Obstructive sleep apnea (adult) (pediatric): Secondary | ICD-10-CM

## 2013-02-23 LAB — HEMOGLOBIN A1C: Hgb A1c MFr Bld: 5.1 % (ref 4.6–6.5)

## 2013-02-23 NOTE — Progress Notes (Signed)
Subjective:     Brittany [name] is a 79 y.o. female and is here for a comprehensive physical exam. She and her husband recently relocated to [facility] from [place]. They are retired and live at [facility] [name]. Currently she is treated for GERD, HTN, Depressive disorder, urinary incontinence, OSA, macular degenerative disease, and hypothyroidism. She had a pacemaker implanted for treatment of atrial fibrillation and plans to establish with Dr. Clide [name] . She is up to date on vaccines; she had a colon cancer screening in 2013 and no f/u was recommended. She had a mammogram 11/13 and bone density scan about 6 years ago, per pt report. She has no complaints today, but requests prescription for CPAP-will request records of last sleep study.  History   Social History  . Marital Status: Married    Spouse Name: N/A    Number of Children: 4  . Years of Education: 12   Occupational History  . Personell Manager      retired   Social History Main Topics  . Smoking status: Former Smoker    Quit date: 02/23/1993  . Smokeless tobacco: Never Used  . Alcohol Use: Yes  . Drug Use: No  . Sexually Active: Not on file   Other Topics Concern  . Not on file   Social History Narrative   [name] lives with her husband at [facility] [name]. They are retired and recently relocated to [facility] from [place]. She has 4 grown children.   No health maintenance topics applied.  The following portions of the patient's history were reviewed and updated as appropriate: allergies, current medications, past family history, past medical history, past social history, past surgical history and problem list.  Review of Systems Constitutional: negative for anorexia, chills, fatigue, fevers and night sweats Eyes: current treatment for macular degenerative disease-last eye exam 1 week ago  Ears, nose, mouth, throat, and face: negative for earaches, hearing loss, nasal congestion, sore throat and voice change Respiratory: negative for asthma,  cough, pleurisy/chest pain, pneumonia, sputum and wheezing Cardiovascular: negative for chest pain, fatigue, lower extremity edema, syncope and pt has pacemaker for atrial fibrillation Gastrointestinal: positive for constipation and takes senekot daily Genitourinary:positive for urinary incontinence Integument/breast: positive for multiple skin tears and bruising Musculoskeletal:negative for arthralgias, muscle weakness, myalgias and stiff joints Behavioral/Psych: positive for anxiety and states sometimes there is family stress, and citalopram helps her stay calm. Endocrine: negative for diabetic symptoms including polydipsia, polyphagia and polyuria and temperature intolerance Allergic/Immunologic: negative for hay fever and urticaria   Objective:    BP 112/60  Pulse 79  Temp(Src) 98 F (36.7 C) (Oral)  Ht 5' 6.17" (1.681 m)  Wt 157 lb 12 oz (71.555 kg)  BMI 25.32 kg/m2  SpO2 95%  LMP 07/22/1968 General appearance: alert, cooperative, appears stated age and no distress Head: Normocephalic, without obvious abnormality, atraumatic Eyes: negative findings: lids and lashes normal, conjunctivae and sclerae normal, corneas clear and pupils equal, round, reactive to light and accomodation Ears: normal TM's and external ear canals both ears Nose: Nares normal. Septum midline. Mucosa normal. No drainage or sinus tenderness. Throat: lips, mucosa, and tongue normal; teeth and gums normal Lungs: clear to auscultation bilaterally Heart: regular rate and rhythm, S1, S2 normal, no murmur, click, rub or gallop Abdomen: soft, non-tender; bowel sounds normal; no masses,  no organomegaly Extremities: extremities normal, atraumatic, no cyanosis or edema and she has several skin tears and ecchymosis consistent with thinned epidermis Pulses: 2+ and symmetric Skin: atrophy - generalized, ecchymoses -  scattered, arm(s) bilateral, lower leg(s) bilateral and skin tears. no signs of infection. Lymph nodes:  Cervical, supraclavicular, and axillary nodes normal. and thickened area R sternoclavicular notch, soft, puffy. Neurologic: Grossly normal    Assessment:   1 preventive care-vaccines UTD, MMG UTD, coloscopy-few polyps no f/u recommended, will schedule bone density scan, exercises regularly 2 Sleep apnea-historical Dx, uses CPAP 3 HTN well controlled. Meds:benazepril HCTZ 4 hypothyroid taking synthroid, no current symptoms. Last TSH 4/14 1.30 5 GERD taking omeprazole. No current symptoms 6 mood disorder taking citalopram. Enjoys hobbies, sleeping well. 7 atrial fib-pacemaker & sotalol, plans to establish w.Dr. Clide [name] this month. 8 urinary incontinence- taking desmopressin & solifenacin   Plan:  1 vit D, UA, HgA1C today. CBC, renal & liver in 9 mos. 2 ordered CPAP, requested last sleep study 3 Continue current meds 4 TSH today 5 Continue meds 6 Continue Meds 7 Monitor for Dr. Wilmon [name] notes 8 Continue meds    See After Visit Summary for Counseling Recommendations

## 2013-02-23 NOTE — Patient Instructions (Signed)
We will have flu shots in September. You look great! Pleasure to meet you!  Preventive Care for Adults, Female A healthy lifestyle and preventive care can promote health and wellness. Preventive health guidelines for women include the following key practices.  A routine yearly physical is a good way to check with your caregiver about your health and preventive screening. It is a chance to share any concerns and updates on your health, and to receive a thorough exam.  Visit your dentist for a routine exam and preventive care every 6 months. Brush your teeth twice a day and floss once a day. Good oral hygiene prevents tooth decay and gum disease.  The frequency of eye exams is based on your age, health, family medical history, use of contact lenses, and other factors. Follow your caregiver's recommendations for frequency of eye exams.  Eat a healthy diet. Foods like vegetables, fruits, whole grains, low-fat dairy products, and lean protein foods contain the nutrients you need without too many calories. Decrease your intake of foods high in solid fats, added sugars, and salt. Eat the right amount of calories for you.Get information about a proper diet from your caregiver, if necessary.  Regular physical exercise is one of the most important things you can do for your health. Most adults should get at least 150 minutes of moderate-intensity exercise (any activity that increases your heart rate and causes you to sweat) each week. In addition, most adults need muscle-strengthening exercises on 2 or more days a week.  Maintain a healthy weight. The body mass index (BMI) is a screening tool to identify possible weight problems. It provides an estimate of body fat based on height and weight. Your caregiver can help determine your BMI, and can help you achieve or maintain a healthy weight.For adults 20 years and older:  A BMI below 18.5 is considered underweight.  A BMI of 18.5 to 24.9 is normal.  A BMI  of 25 to 29.9 is considered overweight.  A BMI of 30 and above is considered obese.  Maintain normal blood lipids and cholesterol levels by exercising and minimizing your intake of saturated fat. Eat a balanced diet with plenty of fruit and vegetables. Blood tests for lipids and cholesterol should begin at age 68 and be repeated every 5 years. If your lipid or cholesterol levels are high, you are over 50, or you are at high risk for heart disease, you may need your cholesterol levels checked more frequently.Ongoing high lipid and cholesterol levels should be treated with medicines if diet and exercise are not effective.  If you smoke, find out from your caregiver how to quit. If you do not use tobacco, do not start.  If you are pregnant, do not drink alcohol. If you are breastfeeding, be very cautious about drinking alcohol. If you are not pregnant and choose to drink alcohol, do not exceed 1 drink per day. One drink is considered to be 12 ounces (355 mL) of beer, 5 ounces (148 mL) of wine, or 1.5 ounces (44 mL) of liquor.  Avoid use of street drugs. Do not share needles with anyone. Ask for help if you need support or instructions about stopping the use of drugs.  High blood pressure causes heart disease and increases the risk of stroke. Your blood pressure should be checked at least every 1 to 2 years. Ongoing high blood pressure should be treated with medicines if weight loss and exercise are not effective.  If you are 55 to 77  years old, ask your caregiver if you should take aspirin to prevent strokes.  Diabetes screening involves taking a blood sample to check your fasting blood sugar level. This should be done once every 3 years, after age 55, if you are within normal weight and without risk factors for diabetes. Testing should be considered at a younger age or be carried out more frequently if you are overweight and have at least 1 risk factor for diabetes.  Breast cancer screening is  essential preventive care for women. You should practice "breast self-awareness." This means understanding the normal appearance and feel of your breasts and may include breast self-examination. Any changes detected, no matter how small, should be reported to a caregiver. Women in their 76s and 30s should have a clinical breast exam (CBE) by a caregiver as part of a regular health exam every 1 to 3 years. After age 17, women should have a CBE every year. Starting at age 41, women should consider having a mammography (breast X-ray test) every year. Women who have a family history of breast cancer should talk to their caregiver about genetic screening. Women at a high risk of breast cancer should talk to their caregivers about having magnetic resonance imaging (MRI) and a mammography every year.  The Pap test is a screening test for cervical cancer. A Pap test can show cell changes on the cervix that might become cervical cancer if left untreated. A Pap test is a procedure in which cells are obtained and examined from the lower end of the uterus (cervix).  Women should have a Pap test starting at age 74.  Between ages 53 and 57, Pap tests should be repeated every 2 years.  Beginning at age 67, you should have a Pap test every 3 years as long as the past 3 Pap tests have been normal.  Some women have medical problems that increase the chance of getting cervical cancer. Talk to your caregiver about these problems. It is especially important to talk to your caregiver if a new problem develops soon after your last Pap test. In these cases, your caregiver may recommend more frequent screening and Pap tests.  The above recommendations are the same for women who have or have not gotten the vaccine for human papillomavirus (HPV).  If you had a hysterectomy for a problem that was not cancer or a condition that could lead to cancer, then you no longer need Pap tests. Even if you no longer need a Pap test, a  regular exam is a good idea to make sure no other problems are starting.  If you are between ages 107 and 38, and you have had normal Pap tests going back 10 years, you no longer need Pap tests. Even if you no longer need a Pap test, a regular exam is a good idea to make sure no other problems are starting.  If you have had past treatment for cervical cancer or a condition that could lead to cancer, you need Pap tests and screening for cancer for at least 20 years after your treatment.  If Pap tests have been discontinued, risk factors (such as a new sexual partner) need to be reassessed to determine if screening should be resumed.  The HPV test is an additional test that may be used for cervical cancer screening. The HPV test looks for the virus that can cause the cell changes on the cervix. The cells collected during the Pap test can be tested for HPV. The  HPV test could be used to screen women aged 51 years and older, and should be used in women of any age who have unclear Pap test results. After the age of 30, women should have HPV testing at the same frequency as a Pap test.  Colorectal cancer can be detected and often prevented. Most routine colorectal cancer screening begins at the age of 33 and continues through age 50. However, your caregiver may recommend screening at an earlier age if you have risk factors for colon cancer. On a yearly basis, your caregiver may provide home test kits to check for hidden blood in the stool. Use of a small camera at the end of a tube, to directly examine the colon (sigmoidoscopy or colonoscopy), can detect the earliest forms of colorectal cancer. Talk to your caregiver about this at age 55, when routine screening begins. Direct examination of the colon should be repeated every 5 to 10 years through age 30, unless early forms of pre-cancerous polyps or small growths are found.  Hepatitis C blood testing is recommended for all people born from [birth year] through 1965  and any individual with known risks for hepatitis C.  Practice safe sex. Use condoms and avoid high-risk sexual practices to reduce the spread of sexually transmitted infections (STIs). STIs include gonorrhea, chlamydia, syphilis, trichomonas, herpes, HPV, and human immunodeficiency virus (HIV). Herpes, HIV, and HPV are viral illnesses that have no cure. They can result in disability, cancer, and death. Sexually active women aged 9 and younger should be checked for chlamydia. Older women with new or multiple partners should also be tested for chlamydia. Testing for other STIs is recommended if you are sexually active and at increased risk.  Osteoporosis is a disease in which the bones lose minerals and strength with aging. This can result in serious bone fractures. The risk of osteoporosis can be identified using a bone density scan. Women ages 48 and over and women at risk for fractures or osteoporosis should discuss screening with their caregivers. Ask your caregiver whether you should take a calcium supplement or vitamin D to reduce the rate of osteoporosis.  Menopause can be associated with physical symptoms and risks. Hormone replacement therapy is available to decrease symptoms and risks. You should talk to your caregiver about whether hormone replacement therapy is right for you.  Use sunscreen with sun protection factor (SPF) of 30 or more. Apply sunscreen liberally and repeatedly throughout the day. You should seek shade when your shadow is shorter than you. Protect yourself by wearing long sleeves, pants, a wide-brimmed hat, and sunglasses year round, whenever you are outdoors.  Once a month, do a whole body skin exam, using a mirror to look at the skin on your back. Notify your caregiver of new moles, moles that have irregular borders, moles that are larger than a pencil eraser, or moles that have changed in shape or color.  Stay current with required immunizations.  Influenza. You need a  dose every fall (or winter). The composition of the flu vaccine changes each year, so being vaccinated once is not enough.  Pneumococcal polysaccharide. You need 1 to 2 doses if you smoke cigarettes or if you have certain chronic medical conditions. You need 1 dose at age 37 (or older) if you have never been vaccinated.  Tetanus, diphtheria, pertussis (Tdap, Td). Get 1 dose of Tdap vaccine if you are younger than age 43, are over 35 and have contact with an infant, are a Research scientist (physical sciences), are pregnant,  or simply want to be protected from whooping cough. After that, you need a Td booster dose every 10 years. Consult your caregiver if you have not had at least 3 tetanus and diphtheria-containing shots sometime in your life or have a deep or dirty wound.  HPV. You need this vaccine if you are a woman age 104 or younger. The vaccine is given in 3 doses over 6 months.  Measles, mumps, rubella (MMR). You need at least 1 dose of MMR if you were born in 1957 or later. You may also need a second dose.  Meningococcal. If you are age 85 to 50 and a first-year college student living in a residence hall, or have one of several medical conditions, you need to get vaccinated against meningococcal disease. You may also need additional booster doses.  Zoster (shingles). If you are age 16 or older, you should get this vaccine.  Varicella (chickenpox). If you have never had chickenpox or you were vaccinated but received only 1 dose, talk to your caregiver to find out if you need this vaccine.  Hepatitis A. You need this vaccine if you have a specific risk factor for hepatitis A virus infection or you simply wish to be protected from this disease. The vaccine is usually given as 2 doses, 6 to 18 months apart.  Hepatitis B. You need this vaccine if you have a specific risk factor for hepatitis B virus infection or you simply wish to be protected from this disease. The vaccine is given in 3 doses, usually over 6  months. Preventive Services / Frequency Ages 59 to 9  Blood pressure check.** / Every 1 to 2 years.  Lipid and cholesterol check.** / Every 5 years beginning at age 85.  Clinical breast exam.** / Every 3 years for women in their 62s and 72s.  Pap test.** / Every 2 years from ages 66 through 21. Every 3 years starting at age 1 through age 29 or 47 with a history of 3 consecutive normal Pap tests.  HPV screening.** / Every 3 years from ages 86 through ages 56 to 40 with a history of 3 consecutive normal Pap tests.  Hepatitis C blood test.** / For any individual with known risks for hepatitis C.  Skin self-exam. / Monthly.  Influenza immunization.** / Every year.  Pneumococcal polysaccharide immunization.** / 1 to 2 doses if you smoke cigarettes or if you have certain chronic medical conditions.  Tetanus, diphtheria, pertussis (Tdap, Td) immunization. / A one-time dose of Tdap vaccine. After that, you need a Td booster dose every 10 years.  HPV immunization. / 3 doses over 6 months, if you are 59 and younger.  Measles, mumps, rubella (MMR) immunization. / You need at least 1 dose of MMR if you were born in 1957 or later. You may also need a second dose.  Meningococcal immunization. / 1 dose if you are age 40 to 62 and a first-year college student living in a residence hall, or have one of several medical conditions, you need to get vaccinated against meningococcal disease. You may also need additional booster doses.  Varicella immunization.** / Consult your caregiver.  Hepatitis A immunization.** / Consult your caregiver. 2 doses, 6 to 18 months apart.  Hepatitis B immunization.** / Consult your caregiver. 3 doses usually over 6 months. Ages 51 to 57  Blood pressure check.** / Every 1 to 2 years.  Lipid and cholesterol check.** / Every 5 years beginning at age 55.  Clinical breast exam.** /  Every year after age 41.  Mammogram.** / Every year beginning at age 66 and  continuing for as long as you are in good health. Consult with your caregiver.  Pap test.** / Every 3 years starting at age 22 through age 57 or 24 with a history of 3 consecutive normal Pap tests.  HPV screening.** / Every 3 years from ages 32 through ages 52 to 31 with a history of 3 consecutive normal Pap tests.  Fecal occult blood test (FOBT) of stool. / Every year beginning at age 9 and continuing until age 12. You may not need to do this test if you get a colonoscopy every 10 years.  Flexible sigmoidoscopy or colonoscopy.** / Every 5 years for a flexible sigmoidoscopy or every 10 years for a colonoscopy beginning at age 1 and continuing until age 86.  Hepatitis C blood test.** / For all people born from [birth year] through 1963 and any individual with known risks for hepatitis C.  Skin self-exam. / Monthly.  Influenza immunization.** / Every year.  Pneumococcal polysaccharide immunization.** / 1 to 2 doses if you smoke cigarettes or if you have certain chronic medical conditions.  Tetanus, diphtheria, pertussis (Tdap, Td) immunization.** / A one-time dose of Tdap vaccine. After that, you need a Td booster dose every 10 years.  Measles, mumps, rubella (MMR) immunization. / You need at least 1 dose of MMR if you were born in 1957 or later. You may also need a second dose.  Varicella immunization.** / Consult your caregiver.  Meningococcal immunization.** / Consult your caregiver.  Hepatitis A immunization.** / Consult your caregiver. 2 doses, 6 to 18 months apart.  Hepatitis B immunization.** / Consult your caregiver. 3 doses, usually over 6 months. Ages 63 and over  Blood pressure check.** / Every 1 to 2 years.  Lipid and cholesterol check.** / Every 5 years beginning at age 29.  Clinical breast exam.** / Every year after age 1.  Mammogram.** / Every year beginning at age 64 and continuing for as long as you are in good health. Consult with your caregiver.  Pap test.** / Every  3 years starting at age 1 through age 38 or 25 with a 3 consecutive normal Pap tests. Testing can be stopped between 65 and 70 with 3 consecutive normal Pap tests and no abnormal Pap or HPV tests in the past 10 years.  HPV screening.** / Every 3 years from ages 34 through ages 20 or 61 with a history of 3 consecutive normal Pap tests. Testing can be stopped between 65 and 70 with 3 consecutive normal Pap tests and no abnormal Pap or HPV tests in the past 10 years.  Fecal occult blood test (FOBT) of stool. / Every year beginning at age 37 and continuing until age 28. You may not need to do this test if you get a colonoscopy every 10 years.  Flexible sigmoidoscopy or colonoscopy.** / Every 5 years for a flexible sigmoidoscopy or every 10 years for a colonoscopy beginning at age 1 and continuing until age 79.  Hepatitis C blood test.** / For all people born from [birth year] through 1963 and any individual with known risks for hepatitis C.  Osteoporosis screening.** / A one-time screening for women ages 89 and over and women at risk for fractures or osteoporosis.  Skin self-exam. / Monthly.  Influenza immunization.** / Every year.  Pneumococcal polysaccharide immunization.** / 1 dose at age 77 (or older) if you have never been vaccinated.  Tetanus, diphtheria, pertussis (  Tdap, Td) immunization. / A one-time dose of Tdap vaccine if you are over 65 and have contact with an infant, are a Dietitian, or simply want to be protected from whooping cough. After that, you need a Td booster dose every 10 years.  Varicella immunization.** / Consult your caregiver.  Meningococcal immunization.** / Consult your caregiver.  Hepatitis A immunization.** / Consult your caregiver. 2 doses, 6 to 18 months apart.  Hepatitis B immunization.** / Check with your caregiver. 3 doses, usually over 6 months. ** Family history and personal history of risk and conditions may change your caregiver's  recommendations. Document Released: 09/03/2001 Document Revised: 09/30/2011 Document Reviewed: 12/03/2010 [facility] Patient Information 2014 [place], [place].

## 2013-02-23 NOTE — Addendum Note (Signed)
Addended by: Alben Spittle, Konrad Felix COX on: 02/23/2013 05:06 PM   Modules accepted: Orders

## 2013-02-23 NOTE — Addendum Note (Signed)
Addended by: Baldemar Lenis R on: 02/23/2013 05:02 PM   Modules accepted: Orders

## 2013-02-24 ENCOUNTER — Encounter: Payer: Self-pay | Admitting: *Deleted

## 2013-02-24 ENCOUNTER — Telehealth: Payer: Self-pay | Admitting: Nurse Practitioner

## 2013-02-24 LAB — URINALYSIS, ROUTINE W REFLEX MICROSCOPIC
Bilirubin Urine: NEGATIVE
Glucose, UA: NEGATIVE mg/dL
Hgb urine dipstick: NEGATIVE
Ketones, ur: NEGATIVE mg/dL
Nitrite: NEGATIVE
Protein, ur: NEGATIVE mg/dL
Specific Gravity, Urine: 1.017 (ref 1.005–1.030)
Urobilinogen, UA: 0.2 mg/dL (ref 0.0–1.0)
pH: 6 (ref 5.0–8.0)

## 2013-02-24 LAB — VITAMIN D 25 HYDROXY (VIT D DEFICIENCY, FRACTURES): Vit D, 25-Hydroxy: 37 ng/mL (ref 30–89)

## 2013-02-24 LAB — TSH: TSH: 1.73 u[IU]/mL (ref 0.35–5.50)

## 2013-02-24 LAB — URINALYSIS, MICROSCOPIC ONLY
Casts: NONE SEEN
Squamous Epithelial / LPF: NONE SEEN

## 2013-02-24 NOTE — Telephone Encounter (Signed)
Per labs, no diabetes, TSH normal range-no change in synthroid. Urine shows calcium oxalate crystals-no Hx of kidney stones. Vit D level low, recommend 5000iu capsule daily. Discussed w/pt. We need records from historical provider for sleep study. (Her insurance carrier changed when she moved to [facility] & respiratory company does not take new ins.)

## 2013-02-25 ENCOUNTER — Telehealth: Payer: Self-pay | Admitting: *Deleted

## 2013-02-25 NOTE — Telephone Encounter (Signed)
Called patient to notify her that a referral for cpap supplies has been placed.

## 2013-03-02 ENCOUNTER — Telehealth: Payer: Self-pay | Admitting: *Deleted

## 2013-03-02 ENCOUNTER — Encounter: Payer: Self-pay | Admitting: *Deleted

## 2013-03-02 ENCOUNTER — Ambulatory Visit
Admission: RE | Admit: 2013-03-02 | Discharge: 2013-03-02 | Disposition: A | Discharge status: Home / Self Care | Payer: Medicare Other | Source: Ambulatory Visit | Attending: Nurse Practitioner | Admitting: Nurse Practitioner

## 2013-03-02 DIAGNOSIS — E2839 Other primary ovarian failure: Secondary | ICD-10-CM

## 2013-03-02 NOTE — Telephone Encounter (Signed)
Bone density scan shows T score -1.2, moderate risk for fracture using Frax. Attempted to call pt to discuss, no answer, no voice mail. Will try again. Will advise to limit alcohol to 1-2 drinks daily, limit caffeine to 1-2 cups. Continue calcium & vit D supplement.

## 2013-03-02 NOTE — Telephone Encounter (Signed)
Patient left vm concerning CPAP order. Returned patient's call, LMOVM.

## 2013-03-02 NOTE — Telephone Encounter (Signed)
Patient called to say she has not heard from [facility] about her CPAP machine.  I called [facility] and they stated that they called with no answer or answering machine.  I gave the patient [employer]'s telephone number so that she can call and set up a time for them to come out.-Nikki Attaway

## 2013-03-03 ENCOUNTER — Telehealth: Payer: Self-pay | Admitting: Nurse Practitioner

## 2013-03-03 NOTE — Telephone Encounter (Signed)
Patient notified of results.

## 2013-03-04 ENCOUNTER — Ambulatory Visit (INDEPENDENT_AMBULATORY_CARE_PROVIDER_SITE_OTHER): Payer: Medicare Other | Admitting: Internal Medicine

## 2013-03-04 ENCOUNTER — Encounter: Payer: Self-pay | Admitting: Internal Medicine

## 2013-03-04 VITALS — BP 129/74 | HR 65 | Ht 68.0 in | Wt 158.4 lb

## 2013-03-04 DIAGNOSIS — I495 Sick sinus syndrome: Secondary | ICD-10-CM

## 2013-03-04 DIAGNOSIS — Z950 Presence of cardiac pacemaker: Secondary | ICD-10-CM

## 2013-03-04 DIAGNOSIS — I4891 Unspecified atrial fibrillation: Secondary | ICD-10-CM

## 2013-03-04 DIAGNOSIS — I10 Essential (primary) hypertension: Secondary | ICD-10-CM

## 2013-03-04 LAB — BASIC METABOLIC PANEL
BUN: 15 mg/dL (ref 6–23)
CO2: 30 mEq/L (ref 19–32)
Calcium: 10.4 mg/dL (ref 8.4–10.5)
Chloride: 97 mEq/L (ref 96–112)
Creatinine, Ser: 0.5 mg/dL (ref 0.4–1.2)
GFR: 116.4 mL/min (ref >60.00)
Glucose, Bld: 99 mg/dL (ref 70–99)
Potassium: 4.8 mEq/L (ref 3.5–5.1)
Sodium: 131 mEq/L — ABNORMAL LOW (ref 135–145)

## 2013-03-04 LAB — PACEMAKER DEVICE OBSERVATION
AL IMPEDENCE PM: 483 Ohm
AL THRESHOLD: 1 V
ATRIAL PACING PM: 94
BAMS-0001: 175 {beats}/min
BATTERY VOLTAGE: 2.79 V
RV LEAD AMPLITUDE: 11.2 mv
RV LEAD IMPEDENCE PM: 488 Ohm
RV LEAD THRESHOLD: 0.75 V
VENTRICULAR PACING PM: 0

## 2013-03-04 LAB — MAGNESIUM: Magnesium: 1.7 mg/dL (ref 1.5–2.5)

## 2013-03-04 NOTE — Assessment & Plan Note (Signed)
100% atrially paced chronotropic response

## 2013-03-04 NOTE — Assessment & Plan Note (Signed)
The patient's device was interrogated.  The information was reviewed. No changes were made in the programming.    

## 2013-03-04 NOTE — Assessment & Plan Note (Signed)
Patient has paroxysmal atrial fibrillation for which he takes sotalol. Her dosing is appropriate with normal renal function. Her last potassium level was 3.9. We will reassess it today. We'll also check a magnesium level. We'll plan to see her in 6 months to reassess the above parameters. Her QTC was also normal.  We also did discuss anticoagulation. She is currently on aspirin. We discussed the risk of stroke. I have been in touch with her cardiologist in [place] who will try to get back to you next Tuesday. I will be back in touch  with the patient at that time.

## 2013-03-04 NOTE — Patient Instructions (Signed)
Remote monitoring is used to monitor your Pacemaker of ICD from home. This monitoring reduces the number of office visits required to check your device to one time per year. It allows [place] to keep an eye on the functioning of your device to ensure it is working properly. You are scheduled for a device check from home on 06/07/13. You may send your transmission at any time that day. If you have a wireless device, the transmission will be sent automatically. After your physician reviews your transmission, you will receive a postcard with your next transmission date.  Your physician wants you to follow-up in: 6 months with Rick Duff, PAC. You will receive a reminder letter in the mail two months in advance. If you don't receive a letter, please call our office to schedule the follow-up appointment.  Your physician recommends that you have lab work today: BMP, Mg  Your physician recommends that you continue on your current medications as directed. Please refer to the Current Medication list given to you today.

## 2013-03-04 NOTE — Progress Notes (Signed)
ELECTROPHYSIOLOGY CONSULT NOTE  Patient ID: Brittany [name], MRN: [id number], DOB/AGE: 03-26-1934 79 y.o. Admit date: (Not on file) Date of Consult: 03/04/2013  Primary Physician: Kelle Darting, NP Primary Cardiologist: ne   Chief Complaint:  New to establish    HPI Brittany [name] is a 79 y.o. female   With pacer implanted 12/12 for sinus node dysfunction.  She has atrial fibrillation for which she takes sotalol currently of 120 mg twice daily. Renal function and potassium levels are in the last few months are surprisingly consistent with that dosing  Cardiac evaluation has included a Myoview 8/12 which demonstrated no ischemia and normal left ventricular function  The patient denies chest pain, shortness of breath, nocturnal dyspnea, orthopnea or peripheral edema.  There have been no palpitations, lightheadedness or syncope.   Thromboembolic risk factors include age-78 hypertension-1 for a CHADS-  score of 2 and a CHADS-VASc score of 4  She takes ASA there has been no discussions apparently regarding anticoagulation      Past Medical History  Diagnosis Date  . Chicken pox   . GERD (gastroesophageal reflux disease)   . Hypertension   . Colon polyps   . Urine incontinence   . Depression   . Thyroid disease   . Urinary incontinence       Surgical History:  Past Surgical History  Procedure Laterality Date  . Abdominal hysterectomy  1970  . Cholecystectomy  08/11/1999  . Tonsillectomy and adenoidectomy    . Pacemaker placement Right   . Cataract extraction       Home Meds: Prior to Admission medications   Medication Sig Start Date End Date Taking? Authorizing Provider  amLODipine (NORVASC) 5 MG tablet Take 5 mg by mouth daily.   Yes Historical Provider, MD  aspirin 81 MG tablet Take 81 mg by mouth daily.   Yes Historical Provider, MD  benazepril-hydrochlorthiazide (LOTENSIN HCT) 20-12.5 MG per tablet Take 1 tablet by mouth daily.   Yes Historical Provider, MD    citalopram (CELEXA) 20 MG tablet Take 20 mg by mouth daily.   Yes Historical Provider, MD  Cyanocobalamin (B-12 PO) Take by mouth daily.   Yes Historical Provider, MD  desmopressin (DDAVP) 0.2 MG tablet Take 0.2 mg by mouth daily.   Yes Historical Provider, MD  levothyroxine (SYNTHROID, LEVOTHROID) 25 MCG tablet Take 25 mcg by mouth daily before breakfast.   Yes Historical Provider, MD  Lutein-Zeaxanthin 25-5 MG CAPS Take by mouth daily.   Yes Historical Provider, MD  Misc Natural Products (OSTEO BI-FLEX ADV DOUBLE ST PO) Take by mouth 2 (two) times daily.   Yes Historical Provider, MD  omeprazole (PRILOSEC) 40 MG capsule Take 40 mg by mouth daily.   Yes Historical Provider, MD  solifenacin (VESICARE) 10 MG tablet Take 10 mg by mouth daily.   Yes Historical Provider, MD  sotalol (BETAPACE) 80 MG tablet Take 1 and 1/2 tablet po 2 times daily   Yes Historical Provider, MD  VITAMIN D, CHOLECALCIFEROL, PO Take 5,000 Units by mouth daily.   Yes Historical Provider, MD      Allergies:  Allergies  Allergen Reactions  . Adhesive [Tape]   . Neosporin [Neomycin-Polymyxin-Gramicidin]     History   Social History  . Marital Status: Married    Spouse Name: N/A    Number of Children: 4  . Years of Education: 12   Occupational History  . Personell Manager      retired   Social History Main Topics  .  Smoking status: Former Smoker    Quit date: 02/23/1993  . Smokeless tobacco: Never Used  . Alcohol Use: Yes  . Drug Use: No  . Sexual Activity: Not on file   Other Topics Concern  . Not on file   Social History Narrative   Brittany [name] lives with her husband at [place]. They are retired and recently relocated to [facility] from [place]. She has 4 grown children.     Family History  Problem Relation Age of Onset  . Cancer Mother     colon  . Cancer Father   . Stroke Sister   . Cancer Brother   . Cancer Paternal Aunt     breast  . Heart disease Brother      ROS:  Please see the  history of present illness.     All other systems reviewed and negative.    Physical Exam:   Blood pressure 129/74, pulse 65, height 5\' 8"  (1.727 m), weight 158 lb 6.4 oz (71.85 kg), last menstrual period 07/22/1968. General: Well developed, well nourished female in no acute distress. Head: Normocephalic, atraumatic, sclera non-icteric, no xanthomas, nares are without discharge. EENT: normal Lymph Nodes:  none Back: without scoliosis/kyphosis , no CVA tendersness Neck: Negative for carotid bruits. JVD not elevated. Lungs: Clear bilaterally to auscultation without wheezes, rales, or rhonchi. Breathing is unlabored. Heart: RRR with S1 S2. No  murmur , rubs, or gallops appreciated. Abdomen: Soft, non-tender, non-distended with normoactive bowel sounds. No hepatomegaly. No rebound/guarding. No obvious abdominal masses. Msk:  Strength and tone appear normal for age. Extremities: No clubbing or cyanosis. No resistance further followup for question I raises morninga call about that  edema.  Distal pedal pulses are 2+ and equal bilaterally. Skin: Warm and Dry Neuro: Alert and oriented X 3. CN III-XII intact Grossly normal sensory and motor function . Psych:  Responds to questions appropriately with a normal affect.      Labs: Cardiac Enzymes No results found for this basename: CKTOTAL, CKMB, TROPONINI,  in the last 72 hours CBC Lab Results  Component Value Date   WBC 4.5 10/28/2012   HGB 14.5 10/28/2012   HCT 43.3 10/28/2012   PLT 263 10/28/2012   PROTIME: No results found for this basename: LABPROT, INR,  in the last 72 hours Chemistry No results found for this basename: NA, K, CL, CO2, BUN, CREATININE, CALCIUM, LABALBU, PROT, BILITOT, ALKPHOS, ALT, AST, GLUCOSE,  in the last 168 hours Lipids Lab Results  Component Value Date   CHOL 241* 10/28/2012   HDL 103* 10/28/2012   LDLCALC 124 10/28/2012   TRIG 69 10/28/2012   BNP No results found for this basename: probnp   Miscellaneous No results  found for this basename: DDIMER    Radiology/Studies:  Dg Bone Density  03/02/2013   *RADIOLOGY REPORT*  Clinical Data: 79 year old postmenopausal female with history of high blood pressure and atrial fibrillation.  The patient has taken omeprazole for 4 months.  She drinks 3 or more alcoholic beverages per day. She takes vitamin D.  She stopped taking hormones 15 years ago.  DUAL X-RAY ABSORPTIOMETRY (DXA) FOR BONE MINERAL DENSITY  AP LUMBAR SPINE (L1 - L4)  Bone Mineral Density (BMD):            1.146 g/cm2 Young Adult T Score:                          0.9 Z Score:  3.4  LEFT FEMUR (TOTAL)  Bone Mineral Density (BMD):             0.795 g/cm2 Young Adult T Score:                           -1.2 Z Score:                                                 0.7  ASSESSMENT:  Patient's diagnostic category is LOW BONE MASS by WHO Criteria.  FRACTURE RISK: MODERATE  FRAX: Based on the World Health Organization FRAX model, the 10 year probability of a major osteoporotic fracture is 12%.  The 10 year probability of a hip fracture is 2.3%.  Comparison: None.  RECOMMENDATIONS:  All patients should ensure an adequate intake of dietary calcium (1200mg  daily) and vitamin D (800 IU daily) unless contraindicated. The National Osteoporosis Foundation recommends that FDA-approved medical therapies be considered in postmenopausal women and mean age 105 or older with a:  1)    Hip or vertebral (clinical or morphometric) fracture.  2)   T-score of -2.5 or lower at the spine or hip.  3)   Ten-year fracture probability by FRAX of 3% or greater for hip fracture or 20% or greater for major osteoporotic fracture.  FOLLOW-UP:  People with diagnosed cases of osteoporosis or at high risk for fracture should have regular bone mineral density tests.  For patients eligible for Medicare, routine testing is allowed once every 2 years.  The testing frequency can be increased to one year for patients who  have rapidly progressing disease, those who are receiving or discontinuing medical therapy to restore bone mass, or have additional risk factors.   Original Report Authenticated By: Cain Saupe, M.D.    EKG: atrial pacing at 65 Intervals 16/10/44  Creatinine 4/14 was 0.46; potassium 3.9  Assessment and Plan:   Brittany [name]

## 2013-03-05 ENCOUNTER — Encounter: Payer: Self-pay | Admitting: Internal Medicine

## 2013-03-09 ENCOUNTER — Telehealth: Payer: Self-pay | Admitting: *Deleted

## 2013-03-09 NOTE — Telephone Encounter (Signed)
Rx for cpap supplies was faxed to [facility]. Called patient back to let know.

## 2013-03-09 NOTE — Telephone Encounter (Signed)
Patient called office requeting that we fax over rx for cpap supplies to [facility].

## 2013-03-17 ENCOUNTER — Telehealth: Payer: Self-pay | Admitting: Nurse Practitioner

## 2013-03-17 NOTE — Telephone Encounter (Signed)
Patient is still having trouble getting CPAP supplies. She states that [place] needs a Medical Necessity Form & her sleep study results. Patient is going to contact the office that did the sleep study to get them to send a copy of the sleep study to [facility]. Please follow up with patient.

## 2013-03-18 MED ORDER — AMLODIPINE BESYLATE 5 MG PO TABS: 5.0000 mg | ORAL_TABLET | Freq: Every day | ORAL | Status: DC

## 2013-03-18 NOTE — Telephone Encounter (Signed)
I spoke with [facility] and they delivered 6 cushions for CPAP machine on 03/11/13.  I then called the patient to see why she needed a medical necessity form and she states the company that she got the CPAP from originally is calling for her to return it because the insurance she has now will not pay for it.  I spoke with a [employer] representative and they need a prior approval for a new cpap.  There are two ways this can be done 1) Type a medical necessity form and fax to [facility] or 2) Call [employer] and get a prior approval over the phone [phone].

## 2013-03-18 NOTE — Telephone Encounter (Signed)
Called [employer] to get prior approval over the phone for CPAP. Patient ID # is [id number]. [employer] prior authorization direct # [phone].

## 2013-03-18 NOTE — Telephone Encounter (Signed)
Called patient to ask her about a supply shipment that was sent to her 03/12/13 from [facility].

## 2013-03-18 NOTE — Telephone Encounter (Signed)
Called [facility] to ask them what they need from [place] to resolve this issue. [facility] asked for new rx, encounter info with original ordering PCP, sleep study, and encounter info from patient's visit with Layne. Faxed new rx with requested information to [facility] for CPAP machine.

## 2013-04-26 ENCOUNTER — Encounter: Payer: Self-pay | Admitting: Nurse Practitioner

## 2013-04-26 DIAGNOSIS — R2241 Localized swelling, mass and lump, right lower limb: Secondary | ICD-10-CM | POA: Insufficient documentation

## 2013-04-26 DIAGNOSIS — Z8639 Personal history of other endocrine, nutritional and metabolic disease: Secondary | ICD-10-CM | POA: Insufficient documentation

## 2013-04-26 DIAGNOSIS — Z8679 Personal history of other diseases of the circulatory system: Secondary | ICD-10-CM | POA: Insufficient documentation

## 2013-04-27 ENCOUNTER — Encounter: Payer: Self-pay | Admitting: *Deleted

## 2013-04-27 LAB — CHG COMPLETE CBC & AUTO DIFF WBC
Basophils Absolute: 1 /uL
Eosinophils Absolute: 4 /uL
Lymphocytes Absolute: 22 /uL
Monocytes: 16.8
Neutrophils Manual: 58
nRBC: 0 /100 (ref 0–2)

## 2013-05-13 ENCOUNTER — Other Ambulatory Visit: Payer: Self-pay

## 2013-05-27 ENCOUNTER — Other Ambulatory Visit: Payer: Self-pay | Admitting: Nurse Practitioner

## 2013-05-27 ENCOUNTER — Other Ambulatory Visit: Payer: Self-pay

## 2013-05-27 MED ORDER — LEVOTHYROXINE SODIUM 25 MCG PO TABS: 25.0000 ug | ORAL_TABLET | Freq: Every day | ORAL | Status: DC

## 2013-05-27 NOTE — Telephone Encounter (Signed)
Is it ok to fill this? Please advise?

## 2013-05-27 NOTE — Telephone Encounter (Signed)
Patient ran out of her synthroid that was prescribed previously by her last physician, she is requesting 3 month supply be sent to [facility] on [address]

## 2013-06-07 ENCOUNTER — Ambulatory Visit (INDEPENDENT_AMBULATORY_CARE_PROVIDER_SITE_OTHER): Payer: Medicare Other | Admitting: *Deleted

## 2013-06-07 DIAGNOSIS — I495 Sick sinus syndrome: Secondary | ICD-10-CM

## 2013-06-07 DIAGNOSIS — Z950 Presence of cardiac pacemaker: Secondary | ICD-10-CM

## 2013-06-07 LAB — MDC_IDC_ENUM_SESS_TYPE_REMOTE
Battery Impedance: 177 Ohm
Battery Remaining Longevity: 122 mo
Battery Voltage: 2.79 V
Brady Statistic AP VP Percent: 0 %
Brady Statistic AP VS Percent: 100 %
Brady Statistic AS VP Percent: 0 %
Brady Statistic AS VS Percent: 0 %
Date Time Interrogation Session: 20141117143257
Lead Channel Impedance Value: 482 Ohm
Lead Channel Impedance Value: 524 Ohm
Lead Channel Pacing Threshold Amplitude: 0.625 V
Lead Channel Pacing Threshold Amplitude: 0.875 V
Lead Channel Pacing Threshold Pulse Width: 0.4 ms
Lead Channel Pacing Threshold Pulse Width: 0.4 ms
Lead Channel Sensing Intrinsic Amplitude: 22.4 mV
Lead Channel Setting Pacing Amplitude: 1.75 V
Lead Channel Setting Pacing Amplitude: 2 V
Lead Channel Setting Pacing Pulse Width: 0.4 ms
Lead Channel Setting Sensing Sensitivity: 5.6 mV

## 2013-06-29 ENCOUNTER — Encounter: Payer: Self-pay | Admitting: *Deleted

## 2013-07-01 ENCOUNTER — Ambulatory Visit (INDEPENDENT_AMBULATORY_CARE_PROVIDER_SITE_OTHER): Payer: Medicare Other | Admitting: Nurse Practitioner

## 2013-07-01 ENCOUNTER — Telehealth: Payer: Self-pay | Admitting: *Deleted

## 2013-07-01 ENCOUNTER — Encounter: Payer: Self-pay | Admitting: Nurse Practitioner

## 2013-07-01 VITALS — BP 140/70 | HR 78 | Temp 98.3°F | Ht 66.17 in | Wt 166.0 lb

## 2013-07-01 DIAGNOSIS — F329 Major depressive disorder, single episode, unspecified: Secondary | ICD-10-CM

## 2013-07-01 DIAGNOSIS — G4733 Obstructive sleep apnea (adult) (pediatric): Secondary | ICD-10-CM | POA: Diagnosis not present

## 2013-07-01 DIAGNOSIS — I10 Essential (primary) hypertension: Secondary | ICD-10-CM

## 2013-07-01 DIAGNOSIS — I499 Cardiac arrhythmia, unspecified: Secondary | ICD-10-CM

## 2013-07-01 DIAGNOSIS — F3289 Other specified depressive episodes: Secondary | ICD-10-CM

## 2013-07-01 DIAGNOSIS — K219 Gastro-esophageal reflux disease without esophagitis: Secondary | ICD-10-CM

## 2013-07-01 DIAGNOSIS — Z9989 Dependence on other enabling machines and devices: Secondary | ICD-10-CM

## 2013-07-01 MED ORDER — OMEPRAZOLE 40 MG PO CPDR: 40.0000 mg | DELAYED_RELEASE_CAPSULE | Freq: Every day | ORAL | Status: DC

## 2013-07-01 MED ORDER — SOLIFENACIN SUCCINATE 10 MG PO TABS: 10.0000 mg | ORAL_TABLET | Freq: Every day | ORAL | Status: DC

## 2013-07-01 MED ORDER — BENAZEPRIL-HYDROCHLOROTHIAZIDE 20-12.5 MG PO TABS
1.0000 | ORAL_TABLET | Freq: Every day | ORAL | Status: DC
Start: 2013-07-01 — End: 2013-12-22

## 2013-07-01 MED ORDER — DESMOPRESSIN ACETATE 0.2 MG PO TABS: 0.2000 mg | ORAL_TABLET | Freq: Every day | ORAL | Status: DC

## 2013-07-01 MED ORDER — AMLODIPINE BESYLATE 5 MG PO TABS: 5.0000 mg | ORAL_TABLET | Freq: Every day | ORAL | Status: DC

## 2013-07-01 MED ORDER — CITALOPRAM HYDROBROMIDE 20 MG PO TABS: 20.0000 mg | ORAL_TABLET | Freq: Every day | ORAL | Status: DC

## 2013-07-01 MED ORDER — SOTALOL HCL 80 MG PO TABS: 120.0000 mg | ORAL_TABLET | Freq: Two times a day (BID) | ORAL | Status: DC

## 2013-07-01 MED ORDER — OMEPRAZOLE 20 MG PO CPDR: 20.0000 mg | DELAYED_RELEASE_CAPSULE | Freq: Every day | ORAL | Status: DC

## 2013-07-01 NOTE — Patient Instructions (Signed)
Please continue to use cpap. We will fax the necessary reports to your supplier. If you need a referral I will provide it. See you in 2 months to review chronic conditions. Merry Christmas!  Sleep Apnea  Sleep apnea is a sleep disorder characterized by abnormal pauses in breathing while you sleep. When your breathing pauses, the level of oxygen in your blood decreases. This causes you to move out of deep sleep and into light sleep. As a result, your quality of sleep is poor, and the system that carries your blood throughout your body (cardiovascular system) experiences stress. If sleep apnea remains untreated, the following conditions can develop:  High blood pressure (hypertension).  Coronary artery disease.  Inability to achieve or maintain an erection (impotence).  Impairment of your thought process (cognitive dysfunction). There are three types of sleep apnea: 1. Obstructive sleep apnea Pauses in breathing during sleep because of a blocked airway. 2. Central sleep apnea Pauses in breathing during sleep because the area of the brain that controls your breathing does not send the correct signals to the muscles that control breathing. 3. Mixed sleep apnea A combination of both obstructive and central sleep apnea. RISK FACTORS The following risk factors can increase your risk of developing sleep apnea:  Being overweight.  Smoking.  Having narrow passages in your nose and throat.  Being of older age.  Being female.  Alcohol use.  Sedative and tranquilizer use.  Ethnicity. Among individuals younger than 35 years, African Americans are at increased risk of sleep apnea. SYMPTOMS   Difficulty staying asleep.  Daytime sleepiness and fatigue.  Loss of energy.  Irritability.  Loud, heavy snoring.  Morning headaches.  Trouble concentrating.  Forgetfulness.  Decreased interest in sex. DIAGNOSIS  In order to diagnose sleep apnea, your caregiver will perform a physical  examination. Your caregiver may suggest that you take a home sleep test. Your caregiver may also recommend that you spend the night in a sleep lab. In the sleep lab, several monitors record information about your heart, lungs, and brain while you sleep. Your leg and arm movements and blood oxygen level are also recorded. TREATMENT The following actions may help to resolve mild sleep apnea:  Sleeping on your side.   Using a decongestant if you have nasal congestion.   Avoiding the use of depressants, including alcohol, sedatives, and narcotics.   Losing weight and modifying your diet if you are overweight. There also are devices and treatments to help open your airway:  Oral appliances. These are custom-made mouthpieces that shift your lower jaw forward and slightly open your bite. This opens your airway.  Devices that create positive airway pressure. This positive pressure "splints" your airway open to help you breathe better during sleep. The following devices create positive airway pressure:  Continuous positive airway pressure (CPAP) device. The CPAP device creates a continuous level of air pressure with an air pump. The air is delivered to your airway through a mask while you sleep. This continuous pressure keeps your airway open.  Nasal expiratory positive airway pressure (EPAP) device. The EPAP device creates positive air pressure as you exhale. The device consists of single-use valves, which are inserted into each nostril and held in place by adhesive. The valves create very little resistance when you inhale but create much more resistance when you exhale. That increased resistance creates the positive airway pressure. This positive pressure while you exhale keeps your airway open, making it easier to breath when you inhale again.  Bilevel positive airway pressure (BPAP) device. The BPAP device is used mainly in patients with central sleep apnea. This device is similar to the CPAP  device because it also uses an air pump to deliver continuous air pressure through a mask. However, with the BPAP machine, the pressure is set at two different levels. The pressure when you exhale is lower than the pressure when you inhale.  Surgery. Typically, surgery is only done if you cannot comply with less invasive treatments or if the less invasive treatments do not improve your condition. Surgery involves removing excess tissue in your airway to create a wider passage way. Document Released: 06/28/2002 Document Revised: 11/02/2012 Document Reviewed: 11/14/2011 [facility] Patient Information 2014 [place], [place].

## 2013-07-01 NOTE — Progress Notes (Signed)
Subjective:     Brittany [name] is a 79 y.o. female here to discuss sleep apnea. She was seen by sleep specialist in [place] Prior to relocating. Sleep studies indicated she has OSA. She was prescribed CPAP. She is sleeping well, about 10 hours/night. She has no wakeful interruptions. Reports feeling rested during the day. She has been using CPAP for nearly 1 year. Prior to using Cpap she woke every 2 hours, thinking periods of apnea were waking her.  Previous Report(s) Reviewed: Sleep Study Report performed by [employer], Dr Sanjuana Letters.   The following portions of the patient's history were reviewed and updated as appropriate: allergies, current medications, past family history, past medical history, past social history, past surgical history and problem list.  Review of Systems Constitutional: negative for fatigue and weight loss Respiratory: negative for cough, sputum and wheezing Genitourinary:negative, taking DDAVP & vesicare for nocturia and incontinence Neurological: negative for dizziness, gait problems, headaches and memory problems Behavioral/Psych: negative for anxiety, excessive alcohol consumption, sleep disturbance and tobacco use    Objective:    BP 140/70  Pulse 78  Temp(Src) 98.3 F (36.8 C) (Oral)  Ht 5' 6.17" (1.681 m)  Wt 166 lb (75.297 kg)  BMI 26.65 kg/m2  SpO2 97%  LMP 07/22/1968 General appearance: alert, cooperative, appears stated age and no distress Head: Normocephalic, without obvious abnormality, atraumatic Eyes: negative findings: lids and lashes normal and conjunctivae and sclerae normal Lungs: clear to auscultation bilaterally Heart: regular rate and rhythm, S1, S2 normal, no murmur, click, rub or gallop    Assessment:    obstructive sleep apnea    Refilled meds: decreased omeprazole due to potential interaction with citalopram. Would like for cardiology to prescribe sotalol-will refill today, pt has f/u appt. In Feb w/cardio. I  will see pt in Feb to review chronic conditions. Plan:     Continue CPAP parameters as prescribed by historical sleep specialist.

## 2013-07-01 NOTE — Telephone Encounter (Signed)
Called patient to let her know that her Omeprazole was being changed by Layne from 40 mg to 20 MG once daily. Mailed new rx to patient's home address, per patient's request.

## 2013-07-01 NOTE — Progress Notes (Signed)
Pre-visit discussion using our clinic review tool. No additional management support is needed unless otherwise documented below in the visit note.  

## 2013-07-05 DIAGNOSIS — H353190 Nonexudative age-related macular degeneration, unspecified eye, stage unspecified: Secondary | ICD-10-CM | POA: Diagnosis not present

## 2013-07-05 DIAGNOSIS — H353290 Exudative age-related macular degeneration, unspecified eye, stage unspecified: Secondary | ICD-10-CM | POA: Diagnosis not present

## 2013-07-05 DIAGNOSIS — H26499 Other secondary cataract, unspecified eye: Secondary | ICD-10-CM | POA: Diagnosis not present

## 2013-07-05 DIAGNOSIS — H2640 Unspecified secondary cataract: Secondary | ICD-10-CM | POA: Diagnosis not present

## 2013-07-12 DIAGNOSIS — L905 Scar conditions and fibrosis of skin: Secondary | ICD-10-CM | POA: Diagnosis not present

## 2013-07-12 DIAGNOSIS — L820 Inflamed seborrheic keratosis: Secondary | ICD-10-CM | POA: Diagnosis not present

## 2013-07-12 DIAGNOSIS — D235 Other benign neoplasm of skin of trunk: Secondary | ICD-10-CM | POA: Diagnosis not present

## 2013-07-12 DIAGNOSIS — L570 Actinic keratosis: Secondary | ICD-10-CM | POA: Diagnosis not present

## 2013-07-19 ENCOUNTER — Encounter: Payer: Self-pay | Admitting: Internal Medicine

## 2013-08-17 ENCOUNTER — Emergency Department (HOSPITAL_BASED_OUTPATIENT_CLINIC_OR_DEPARTMENT_OTHER): Payer: Medicare Other

## 2013-08-17 ENCOUNTER — Encounter (HOSPITAL_BASED_OUTPATIENT_CLINIC_OR_DEPARTMENT_OTHER): Payer: Self-pay | Admitting: Emergency Medicine

## 2013-08-17 ENCOUNTER — Emergency Department (HOSPITAL_BASED_OUTPATIENT_CLINIC_OR_DEPARTMENT_OTHER)
Admission: EM | Admit: 2013-08-17 | Discharge: 2013-08-18 | Disposition: A | Discharge status: Home / Self Care | Payer: Medicare Other | Attending: Emergency Medicine | Admitting: Emergency Medicine

## 2013-08-17 DIAGNOSIS — S02400A Malar fracture unspecified, initial encounter for closed fracture: Secondary | ICD-10-CM | POA: Insufficient documentation

## 2013-08-17 DIAGNOSIS — K219 Gastro-esophageal reflux disease without esophagitis: Secondary | ICD-10-CM | POA: Insufficient documentation

## 2013-08-17 DIAGNOSIS — Y929 Unspecified place or not applicable: Secondary | ICD-10-CM | POA: Insufficient documentation

## 2013-08-17 DIAGNOSIS — F3289 Other specified depressive episodes: Secondary | ICD-10-CM | POA: Diagnosis not present

## 2013-08-17 DIAGNOSIS — H0589 Other disorders of orbit: Secondary | ICD-10-CM | POA: Diagnosis not present

## 2013-08-17 DIAGNOSIS — I10 Essential (primary) hypertension: Secondary | ICD-10-CM | POA: Insufficient documentation

## 2013-08-17 DIAGNOSIS — S0993XA Unspecified injury of face, initial encounter: Secondary | ICD-10-CM | POA: Diagnosis not present

## 2013-08-17 DIAGNOSIS — S0990XA Unspecified injury of head, initial encounter: Secondary | ICD-10-CM | POA: Diagnosis not present

## 2013-08-17 DIAGNOSIS — Y9389 Activity, other specified: Secondary | ICD-10-CM | POA: Insufficient documentation

## 2013-08-17 DIAGNOSIS — W19XXXA Unspecified fall, initial encounter: Secondary | ICD-10-CM

## 2013-08-17 DIAGNOSIS — S02401A Maxillary fracture, unspecified, initial encounter for closed fracture: Secondary | ICD-10-CM | POA: Insufficient documentation

## 2013-08-17 DIAGNOSIS — I495 Sick sinus syndrome: Secondary | ICD-10-CM | POA: Insufficient documentation

## 2013-08-17 DIAGNOSIS — E079 Disorder of thyroid, unspecified: Secondary | ICD-10-CM | POA: Insufficient documentation

## 2013-08-17 DIAGNOSIS — W010XXA Fall on same level from slipping, tripping and stumbling without subsequent striking against object, initial encounter: Secondary | ICD-10-CM | POA: Insufficient documentation

## 2013-08-17 DIAGNOSIS — F329 Major depressive disorder, single episode, unspecified: Secondary | ICD-10-CM | POA: Diagnosis not present

## 2013-08-17 DIAGNOSIS — S0230XA Fracture of orbital floor, unspecified side, initial encounter for closed fracture: Secondary | ICD-10-CM | POA: Diagnosis not present

## 2013-08-17 DIAGNOSIS — D126 Benign neoplasm of colon, unspecified: Secondary | ICD-10-CM | POA: Diagnosis not present

## 2013-08-17 DIAGNOSIS — H0520 Unspecified exophthalmos: Secondary | ICD-10-CM

## 2013-08-17 DIAGNOSIS — Z950 Presence of cardiac pacemaker: Secondary | ICD-10-CM | POA: Diagnosis not present

## 2013-08-17 DIAGNOSIS — I4891 Unspecified atrial fibrillation: Secondary | ICD-10-CM | POA: Diagnosis not present

## 2013-08-17 DIAGNOSIS — R32 Unspecified urinary incontinence: Secondary | ICD-10-CM | POA: Insufficient documentation

## 2013-08-17 DIAGNOSIS — S199XXA Unspecified injury of neck, initial encounter: Secondary | ICD-10-CM | POA: Diagnosis not present

## 2013-08-17 DIAGNOSIS — H02849 Edema of unspecified eye, unspecified eyelid: Secondary | ICD-10-CM | POA: Diagnosis not present

## 2013-08-17 LAB — BASIC METABOLIC PANEL
BUN: 8 mg/dL (ref 6–23)
CO2: 24 mEq/L (ref 19–32)
Calcium: 10.4 mg/dL (ref 8.4–10.5)
Chloride: 90 mEq/L — ABNORMAL LOW (ref 96–112)
Creatinine, Ser: 0.5 mg/dL (ref 0.50–1.10)
GFR calc Af Amer: >90 mL/min (ref >90)
GFR calc non Af Amer: >90 mL/min (ref >90)
Glucose, Bld: 109 mg/dL — ABNORMAL HIGH (ref 70–99)
Potassium: 4.8 mEq/L (ref 3.7–5.3)
Sodium: 130 mEq/L — ABNORMAL LOW (ref 137–147)

## 2013-08-17 LAB — URINALYSIS, ROUTINE W REFLEX MICROSCOPIC
Bilirubin Urine: NEGATIVE
Glucose, UA: NEGATIVE mg/dL
Hgb urine dipstick: NEGATIVE
Ketones, ur: NEGATIVE mg/dL
Nitrite: NEGATIVE
Protein, ur: NEGATIVE mg/dL
Specific Gravity, Urine: 1.008 (ref 1.005–1.030)
Urobilinogen, UA: 0.2 mg/dL (ref 0.0–1.0)
pH: 7 (ref 5.0–8.0)

## 2013-08-17 LAB — CBC WITH DIFFERENTIAL/PLATELET
Basophils Absolute: 0 10*3/uL (ref 0.0–0.1)
Basophils Relative: 0 % (ref 0–1)
Eosinophils Absolute: 0.3 10*3/uL (ref 0.0–0.7)
Eosinophils Relative: 4 % (ref 0–5)
HCT: 38 % (ref 36.0–46.0)
Hemoglobin: 13.4 g/dL (ref 12.0–15.0)
Lymphocytes Relative: 21 % (ref 12–46)
Lymphs Abs: 1.5 10*3/uL (ref 0.7–4.0)
MCH: 32.7 pg (ref 26.0–34.0)
MCHC: 35.3 g/dL (ref 30.0–36.0)
MCV: 92.7 fL (ref 78.0–100.0)
Monocytes Absolute: 1.1 10*3/uL — ABNORMAL HIGH (ref 0.1–1.0)
Monocytes Relative: 15 % — ABNORMAL HIGH (ref 3–12)
Neutro Abs: 4.4 10*3/uL (ref 1.7–7.7)
Neutrophils Relative %: 60 % (ref 43–77)
Platelets: 267 10*3/uL (ref 150–400)
RBC: 4.1 MIL/uL (ref 3.87–5.11)
RDW: 12.5 % (ref 11.5–15.5)
WBC: 7.3 10*3/uL (ref 4.0–10.5)

## 2013-08-17 LAB — URINE MICROSCOPIC-ADD ON

## 2013-08-17 MED ORDER — BRIMONIDINE TARTRATE 0.2 % OP SOLN
1.0000 [drp] | Freq: Once | OPHTHALMIC | Status: AC
Start: 2013-08-17 — End: 2013-08-18
  Administered 2013-08-18: 1 [drp] via OPHTHALMIC

## 2013-08-17 MED ORDER — BRIMONIDINE TARTRATE 0.2 % OP SOLN
1.0000 [drp] | Freq: Once | OPHTHALMIC | Status: DC
  Filled 2013-08-17: qty 5

## 2013-08-17 MED ORDER — ONDANSETRON HCL 4 MG/2ML IJ SOLN
INTRAMUSCULAR | Status: AC
  Filled 2013-08-17: qty 2

## 2013-08-17 MED ORDER — LIDOCAINE-EPINEPHRINE 2 %-1:100000 IJ SOLN
INTRAMUSCULAR | Status: AC
  Administered 2013-08-18
  Filled 2013-08-17: qty 1

## 2013-08-17 MED ORDER — TIMOLOL MALEATE 0.5 % OP SOLN
1.0000 [drp] | Freq: Once | OPHTHALMIC | Status: AC
  Administered 2013-08-18: 1 [drp] via OPHTHALMIC
  Filled 2013-08-17: qty 5

## 2013-08-17 MED ORDER — ERYTHROMYCIN 5 MG/GM OP OINT
TOPICAL_OINTMENT | Freq: Once | OPHTHALMIC | Status: AC
  Administered 2013-08-18: via OPHTHALMIC
  Filled 2013-08-17: qty 1

## 2013-08-17 MED ORDER — MORPHINE SULFATE 2 MG/ML IJ SOLN
2.0000 mg | Freq: Once | INTRAMUSCULAR | Status: AC
  Administered 2013-08-17: 2 mg via INTRAVENOUS
  Filled 2013-08-17: qty 1

## 2013-08-17 MED ORDER — ACETAZOLAMIDE 250 MG PO TABS
500.0000 mg | ORAL_TABLET | Freq: Once | ORAL | Status: AC
  Administered 2013-08-17: 500 mg via ORAL
  Filled 2013-08-17 (×2): qty 2

## 2013-08-17 MED ORDER — MORPHINE SULFATE 4 MG/ML IJ SOLN
4.0000 mg | Freq: Once | INTRAMUSCULAR | Status: DC
Start: 2013-08-17 — End: 2013-08-17

## 2013-08-17 MED ORDER — ONDANSETRON HCL 4 MG/2ML IJ SOLN
4.0000 mg | Freq: Once | INTRAMUSCULAR | Status: AC
  Administered 2013-08-17: 4 mg via INTRAVENOUS

## 2013-08-17 NOTE — ED Provider Notes (Addendum)
CSN: [id number]     Arrival date & time 08/17/13  1926 History   First MD Initiated Contact with Patient 08/17/13 1942     Chief Complaint  Patient presents with  . Head Injury   (Consider location/radiation/quality/duration/timing/severity/associated sxs/prior Treatment) HPI Comments: 80 yo female with gerd, htn, pacemaker, a fib, SVT hx presents from [facility] to see opthalmology emergently for lateral canthotomy.  Pt was beding over to get shoes and fell and hit right side of her face on the shoe rack.  Decreased vision right eye.  Pt had CTs done at [facility] ER, pt had lateral canthotomy attempted however IOP 80s and sent to [facility].  Pt has macular degeneration hx, no local optho.  Pain and swelling right eye, nothing improves.  Lacs to forehead repaired in ED.      Patient is a 80 y.o. female presenting with head injury. The history is provided by the patient and medical records.  Head Injury Associated symptoms: headache   Associated symptoms: no neck pain and no vomiting     Past Medical History  Diagnosis Date  . Chicken pox   . GERD (gastroesophageal reflux disease)   . Hypertension   . Colon polyps   . Urine incontinence   . Depression   . Thyroid disease   . Urinary incontinence   . Atrial fibrillation 03/04/2013  . Sinus node dysfunction 03/04/2013  . Pacemaker-[employer] 03/04/2013   Past Surgical History  Procedure Laterality Date  . Abdominal hysterectomy  1970  . Cholecystectomy  08/11/1999  . Tonsillectomy and adenoidectomy    . Pacemaker placement Right   . Cataract extraction     Family History  Problem Relation Age of Onset  . Cancer Mother     colon  . Cancer Father   . Stroke Sister   . Cancer Brother   . Cancer Paternal Aunt     breast  . Heart disease Brother    History  Substance Use Topics  . Smoking status: Former Smoker    Quit date: 02/23/1993  . Smokeless tobacco: Never Used  . Alcohol Use: Yes   OB History   Grav Para Term Preterm  Abortions TAB SAB Ect Mult Living                 Review of Systems  Constitutional: Negative for fever and chills.  HENT: Negative for congestion.   Eyes: Positive for pain and visual disturbance.  Respiratory: Negative for shortness of breath.   Cardiovascular: Negative for chest pain.  Gastrointestinal: Negative for vomiting and abdominal pain.  Genitourinary: Negative for flank pain.  Musculoskeletal: Negative for back pain, neck pain and neck stiffness.  Skin: Negative for rash.  Neurological: Positive for headaches. Negative for syncope and light-headedness.    Allergies  Adhesive and Neosporin  Home Medications   Current Outpatient Rx  Name  Route  Sig  Dispense  Refill  . amLODipine (NORVASC) 5 MG tablet   Oral   Take 1 tablet (5 mg total) by mouth daily.   90 tablet   1   . aspirin 81 MG tablet   Oral   Take 81 mg by mouth daily.         . benazepril-hydrochlorthiazide (LOTENSIN HCT) 20-12.5 MG per tablet   Oral   Take 1 tablet by mouth daily.   90 tablet   1   . citalopram (CELEXA) 20 MG tablet   Oral   Take 1 tablet (20 mg total)  by mouth daily.   90 tablet   1   . Cyanocobalamin (B-12 PO)   Oral   Take by mouth daily.         Marland Kitchen desmopressin (DDAVP) 0.2 MG tablet   Oral   Take 1 tablet (0.2 mg total) by mouth daily.   90 tablet   1   . levothyroxine (SYNTHROID, LEVOTHROID) 25 MCG tablet   Oral   Take 1 tablet (25 mcg total) by mouth daily before breakfast.   90 tablet   2   . Lutein-Zeaxanthin 25-5 MG CAPS   Oral   Take by mouth daily.         . Misc Natural Products (OSTEO BI-FLEX ADV DOUBLE ST PO)   Oral   Take by mouth 2 (two) times daily.         Marland Kitchen omeprazole (PRILOSEC) 20 MG capsule   Oral   Take 1 capsule (20 mg total) by mouth daily.   90 capsule   1   . solifenacin (VESICARE) 10 MG tablet   Oral   Take 1 tablet (10 mg total) by mouth daily.   90 tablet   0   . sotalol (BETAPACE) 80 MG tablet   Oral   Take  1.5 tablets (120 mg total) by mouth 2 (two) times daily. Take 1 and 1/2 tablet po 2 times daily   135 tablet   1   . VITAMIN D, CHOLECALCIFEROL, PO   Oral   Take 5,000 Units by mouth daily.          BP 121/64  Pulse 62  Temp(Src) 97.5 F (36.4 C) (Oral)  Resp 16  Ht 5\' 8"  (1.727 m)  Wt 166 lb (75.297 kg)  BMI 25.25 kg/m2  SpO2 99%  LMP 07/22/1968 Physical Exam  Nursing note and vitals reviewed. Constitutional: She is oriented to person, place, and time. She appears well-developed and well-nourished.  HENT:  Head: Normocephalic.  Right periorbital edema, difficult exam due to swelling, pt had blurry vision to fingers right eye, mild ecchymosis, small laceration right lower eyelid .5 cm.    Eyes:  Mild hazy right pupil, minimal reactive Left pupil reactive, horiz eye movements intact  Neck: Normal range of motion. Neck supple. No tracheal deviation present.  Cardiovascular: Normal rate and regular rhythm.   Pulmonary/Chest: Effort normal and breath sounds normal.  Abdominal: Soft. She exhibits no distension. There is no tenderness. There is no guarding.  Musculoskeletal: She exhibits edema and tenderness.  Neurological: She is alert and oriented to person, place, and time.  Skin: Skin is warm.    ED Course  Procedures (including critical care time) Labs Review Labs Reviewed  CBC WITH DIFFERENTIAL - Abnormal; Notable for the following:    Monocytes Relative 15 (*)    Monocytes Absolute 1.1 (*)    All other components within normal limits  BASIC METABOLIC PANEL - Abnormal; Notable for the following:    Sodium 130 (*)    Chloride 90 (*)    Glucose, Bld 109 (*)    All other components within normal limits  URINALYSIS, ROUTINE W REFLEX MICROSCOPIC - Abnormal; Notable for the following:    Leukocytes, UA TRACE (*)    All other components within normal limits  URINE MICROSCOPIC-ADD ON   Imaging Review Ct Head Wo Contrast  08/17/2013   CLINICAL DATA:  Fall onto shoe  rack  EXAM: CT HEAD WITHOUT CONTRAST  CT MAXILLOFACIAL WITHOUT CONTRAST  CT CERVICAL SPINE WITHOUT  CONTRAST  TECHNIQUE: Multidetector CT imaging of the head, cervical spine, and maxillofacial structures were performed using the standard protocol without intravenous contrast. Multiplanar CT image reconstructions of the cervical spine and maxillofacial structures were also generated.  COMPARISON:  None.  FINDINGS: CT HEAD FINDINGS  Negative for acute intracranial hemorrhage, acute infarction, mass, mass effect, hydrocephalus or midline shift. Gray-white differentiation is preserved throughout. Central and cerebral atrophy with compensatory ex vacuo dilatation of the lateral ventricles. Advanced confluent periventricular, subcortical and deep white matter hypoattenuation which is nonspecific but most consistent with the sequelae of longstanding microvascular ischemia. Right periorbital preseptal soft tissue swelling consistent with hematoma / contusion. Atherosclerotic calcifications in the bilateral carotid siphons.  CT MAXILLOFACIAL FINDINGS  Right orbital blowout fracture. There is a displaced segmental fracture of the inferior orbital wall extending into the medial orbital wall. Fracture then anterior and medial walls of the maxillary sinus. There is associated right maxillary and right paranasal hemo sinus. Hematoma extends into the inferior aspect of the right orbit. There is marked proptosis of the right global by approximately 7 mm compared to the left. There is some concern for tension on the optic nerve.  CT CERVICAL SPINE FINDINGS  No acute fracture, malalignment or prevertebral soft tissue swelling. Extensive multilevel cervical spondylosis and facet arthropathy. Bulky degenerative change around the atlantodental intervals suggests underlying CPPD or rheumatoid arthritis. 1 cm low-attenuation nodule on the right thyroid gland. Bilateral carotid bifurcation atherosclerotic calcifications. No acute soft  tissue abnormality. The lung apices are unremarkable.  IMPRESSION: CT HEAD:  1. No acute intracranial abnormality. 2. Central and cortical atrophy with compensatory ex vacuo dilatation of the lateral ventricles. 3. A advanced chronic microvascular ischemic white matter changes. 4. Intracranial atherosclerosis. CT MAXILLOFACIAL:  1. Right orbital blowout fracture with a displaced segmental fracture of the inferior orbital wall and fracture extending to the medial orbital wall. 2. 7 mm proptosis of the right globe relative to the left. The orbital nerve appears taut raising concern for tension on the nerve. This is at least partially due to hematoma in the inferior aspect of the orbit secondary to the underlying fracture site. 3. Nondisplaced fractures through the anterior and medial walls of the right maxillary sinus. 4. Associated right maxillary and nasal hemo sinus. CT C-SPINE:  1. No acute fracture or malalignment. 2. Extensive multilevel degenerative spondylosis. 3. Bulky degenerative change at the atlantodental interval suggests underlying CPPD or rheumatoid arthritis. 4. Nonspecific 1 cm right thyroid nodule. 5. Bilateral carotid bifurcation atherosclerotic calcifications.  Critical Value/emergent results were called by telephone at the time of interpretation on 08/17/2013 at 9:14 PM to Dr. Pryor Curia , who verbally acknowledged these results.   Electronically Signed   By: Jacqulynn Cadet M.D.   On: 08/17/2013 21:15   Ct Cervical Spine Wo Contrast  08/17/2013   CLINICAL DATA:  Fall onto shoe rack  EXAM: CT HEAD WITHOUT CONTRAST  CT MAXILLOFACIAL WITHOUT CONTRAST  CT CERVICAL SPINE WITHOUT CONTRAST  TECHNIQUE: Multidetector CT imaging of the head, cervical spine, and maxillofacial structures were performed using the standard protocol without intravenous contrast. Multiplanar CT image reconstructions of the cervical spine and maxillofacial structures were also generated.  COMPARISON:  None.  FINDINGS: CT  HEAD FINDINGS  Negative for acute intracranial hemorrhage, acute infarction, mass, mass effect, hydrocephalus or midline shift. Gray-white differentiation is preserved throughout. Central and cerebral atrophy with compensatory ex vacuo dilatation of the lateral ventricles. Advanced confluent periventricular, subcortical and deep white matter hypoattenuation which is  nonspecific but most consistent with the sequelae of longstanding microvascular ischemia. Right periorbital preseptal soft tissue swelling consistent with hematoma / contusion. Atherosclerotic calcifications in the bilateral carotid siphons.  CT MAXILLOFACIAL FINDINGS  Right orbital blowout fracture. There is a displaced segmental fracture of the inferior orbital wall extending into the medial orbital wall. Fracture then anterior and medial walls of the maxillary sinus. There is associated right maxillary and right paranasal hemo sinus. Hematoma extends into the inferior aspect of the right orbit. There is marked proptosis of the right global by approximately 7 mm compared to the left. There is some concern for tension on the optic nerve.  CT CERVICAL SPINE FINDINGS  No acute fracture, malalignment or prevertebral soft tissue swelling. Extensive multilevel cervical spondylosis and facet arthropathy. Bulky degenerative change around the atlantodental intervals suggests underlying CPPD or rheumatoid arthritis. 1 cm low-attenuation nodule on the right thyroid gland. Bilateral carotid bifurcation atherosclerotic calcifications. No acute soft tissue abnormality. The lung apices are unremarkable.  IMPRESSION: CT HEAD:  1. No acute intracranial abnormality. 2. Central and cortical atrophy with compensatory ex vacuo dilatation of the lateral ventricles. 3. A advanced chronic microvascular ischemic white matter changes. 4. Intracranial atherosclerosis. CT MAXILLOFACIAL:  1. Right orbital blowout fracture with a displaced segmental fracture of the inferior orbital  wall and fracture extending to the medial orbital wall. 2. 7 mm proptosis of the right globe relative to the left. The orbital nerve appears taut raising concern for tension on the nerve. This is at least partially due to hematoma in the inferior aspect of the orbit secondary to the underlying fracture site. 3. Nondisplaced fractures through the anterior and medial walls of the right maxillary sinus. 4. Associated right maxillary and nasal hemo sinus. CT C-SPINE:  1. No acute fracture or malalignment. 2. Extensive multilevel degenerative spondylosis. 3. Bulky degenerative change at the atlantodental interval suggests underlying CPPD or rheumatoid arthritis. 4. Nonspecific 1 cm right thyroid nodule. 5. Bilateral carotid bifurcation atherosclerotic calcifications.  Critical Value/emergent results were called by telephone at the time of interpretation on 08/17/2013 at 9:14 PM to Dr. Pryor Curia , who verbally acknowledged these results.   Electronically Signed   By: Jacqulynn Cadet M.D.   On: 08/17/2013 21:15   Ct Maxillofacial Wo Cm  08/17/2013   CLINICAL DATA:  Fall onto shoe rack  EXAM: CT HEAD WITHOUT CONTRAST  CT MAXILLOFACIAL WITHOUT CONTRAST  CT CERVICAL SPINE WITHOUT CONTRAST  TECHNIQUE: Multidetector CT imaging of the head, cervical spine, and maxillofacial structures were performed using the standard protocol without intravenous contrast. Multiplanar CT image reconstructions of the cervical spine and maxillofacial structures were also generated.  COMPARISON:  None.  FINDINGS: CT HEAD FINDINGS  Negative for acute intracranial hemorrhage, acute infarction, mass, mass effect, hydrocephalus or midline shift. Gray-white differentiation is preserved throughout. Central and cerebral atrophy with compensatory ex vacuo dilatation of the lateral ventricles. Advanced confluent periventricular, subcortical and deep white matter hypoattenuation which is nonspecific but most consistent with the sequelae of  longstanding microvascular ischemia. Right periorbital preseptal soft tissue swelling consistent with hematoma / contusion. Atherosclerotic calcifications in the bilateral carotid siphons.  CT MAXILLOFACIAL FINDINGS  Right orbital blowout fracture. There is a displaced segmental fracture of the inferior orbital wall extending into the medial orbital wall. Fracture then anterior and medial walls of the maxillary sinus. There is associated right maxillary and right paranasal hemo sinus. Hematoma extends into the inferior aspect of the right orbit. There is marked proptosis of the  right global by approximately 7 mm compared to the left. There is some concern for tension on the optic nerve.  CT CERVICAL SPINE FINDINGS  No acute fracture, malalignment or prevertebral soft tissue swelling. Extensive multilevel cervical spondylosis and facet arthropathy. Bulky degenerative change around the atlantodental intervals suggests underlying CPPD or rheumatoid arthritis. 1 cm low-attenuation nodule on the right thyroid gland. Bilateral carotid bifurcation atherosclerotic calcifications. No acute soft tissue abnormality. The lung apices are unremarkable.  IMPRESSION: CT HEAD:  1. No acute intracranial abnormality. 2. Central and cortical atrophy with compensatory ex vacuo dilatation of the lateral ventricles. 3. A advanced chronic microvascular ischemic white matter changes. 4. Intracranial atherosclerosis. CT MAXILLOFACIAL:  1. Right orbital blowout fracture with a displaced segmental fracture of the inferior orbital wall and fracture extending to the medial orbital wall. 2. 7 mm proptosis of the right globe relative to the left. The orbital nerve appears taut raising concern for tension on the nerve. This is at least partially due to hematoma in the inferior aspect of the orbit secondary to the underlying fracture site. 3. Nondisplaced fractures through the anterior and medial walls of the right maxillary sinus. 4. Associated  right maxillary and nasal hemo sinus. CT C-SPINE:  1. No acute fracture or malalignment. 2. Extensive multilevel degenerative spondylosis. 3. Bulky degenerative change at the atlantodental interval suggests underlying CPPD or rheumatoid arthritis. 4. Nonspecific 1 cm right thyroid nodule. 5. Bilateral carotid bifurcation atherosclerotic calcifications.  Critical Value/emergent results were called by telephone at the time of interpretation on 08/17/2013 at 9:14 PM to Dr. Pryor Curia , who verbally acknowledged these results.   Electronically Signed   By: Jacqulynn Cadet M.D.   On: 08/17/2013 21:15    EKG Interpretation   None       MDM   1. Head injury   2. Fall   3. Proptosis   4. Orbital floor (blow-out), closed fracture   5. Maxillary fracture   Orbital Hematoma  Ophtho arrived shortly after pt arrival. Lateral canthotomy done in trauma bay, vision improved.  CT results reviewed from [facility].   Pain controlled.  IOP improved per ophtho.   Strict fup discussed at bedside, pt understand. Abx ointment in ED. Eye drop and diamox scripts given by ophtho.  Results and differential diagnosis were discussed with the patient. Close follow up outpatient was discussed, patient comfortable with the plan.        Mariea Clonts, MD 08/18/13 [id number]  Mariea Clonts, MD 08/18/13 [phone]

## 2013-08-17 NOTE — ED Notes (Signed)
Pt fell while reaching into closet-struck head on shoe rack-lac above right eye-swelling to right eye-no LOC

## 2013-08-17 NOTE — ED Notes (Signed)
opthalmology at bedside.

## 2013-08-17 NOTE — ED Notes (Signed)
Patient transported to CT via stretcher per tech. 

## 2013-08-17 NOTE — ED Notes (Signed)
Suture cart at bedside 

## 2013-08-17 NOTE — ED Notes (Signed)
Pt received from [place]. No changes en route. Pt states she was bending over to get shoes when she fell and hit the right side of her face on the shoe rack. Pt is unable to see out of the right eye at this time. Large amount of swelling noted. Sutures noted [facility] right [facility]. Husband at bedside. Vitals wnl. IV intake. Dr Reather Converse at bedside. Pt denies any blurred vision to left eye.

## 2013-08-17 NOTE — ED Notes (Signed)
Opthalmology MD at bedside.

## 2013-08-17 NOTE — ED Provider Notes (Addendum)
TIME SEEN: 8:21 PM  CHIEF COMPLAINT: Fall, head injury  HPI: Patient is a 80 year old female with a history of atrial fibrillation status post pacemaker who is not on anticoagulation who presents the emergency department after she had a fall at approximately 7 PM tonight. Patient reports that she was going into her closet to change her shoes and she thinks she tripped and fell into a shoe rack hitting her right eye. There was no loss of consciousness. She did not fall to the ground. She denies any numbness, tingling or focal weakness. She denies any chest pain, shortness of breath, palpitations or dizziness that led to her fall. No recent fever, cough, vomiting or diarrhea. She has a history of macular degeneration in her right eye and wears glasses at baseline but is normally able to see out of that right eye. She was wearing her glasses when this happened. Her lens did not shatter but her glasses did bend.  Her ophthalmologist is Dr. Epimenio Foot in [place].  ROS: See HPI Constitutional: no fever  Eyes: no drainage  ENT: no runny nose   Cardiovascular:  no chest pain  Resp: no SOB  GI: no vomiting GU: no dysuria Integumentary: no rash  Allergy: no hives  Musculoskeletal: no leg swelling  Neurological: no slurred speech ROS otherwise negative  PAST MEDICAL HISTORY/PAST SURGICAL HISTORY:  Past Medical History  Diagnosis Date  . Chicken pox   . GERD (gastroesophageal reflux disease)   . Hypertension   . Colon polyps   . Urine incontinence   . Depression   . Thyroid disease   . Urinary incontinence   . Atrial fibrillation 03/04/2013  . Sinus node dysfunction 03/04/2013  . Pacemaker-[employer]tr[employer] 03/04/2013    MEDICATIONS:  Prior to Admission medications   Medication Sig Start Date End Date Taking? Authorizing Provider  amLODipine (NORVASC) 5 MG tablet Take 1 tablet (5 mg total) by mouth daily. 07/01/13   Irene Pap, NP  aspirin 81 MG tablet Take 81 mg by mouth daily.     Historical Provider, MD  benazepril-hydrochlorthiazide (LOTENSIN HCT) 20-12.5 MG per tablet Take 1 tablet by mouth daily. 07/01/13   Irene Pap, NP  citalopram (CELEXA) 20 MG tablet Take 1 tablet (20 mg total) by mouth daily. 07/01/13   Irene Pap, NP  Cyanocobalamin (B-12 PO) Take by mouth daily.    Historical Provider, MD  desmopressin (DDAVP) 0.2 MG tablet Take 1 tablet (0.2 mg total) by mouth daily. 07/01/13   Irene Pap, NP  levothyroxine (SYNTHROID, LEVOTHROID) 25 MCG tablet Take 1 tablet (25 mcg total) by mouth daily before breakfast. 05/27/13   Irene Pap, NP  Lutein-Zeaxanthin 25-5 MG CAPS Take by mouth daily.    Historical Provider, MD  Misc Natural Products (OSTEO BI-FLEX ADV DOUBLE ST PO) Take by mouth 2 (two) times daily.    Historical Provider, MD  omeprazole (PRILOSEC) 20 MG capsule Take 1 capsule (20 mg total) by mouth daily. 07/01/13   Irene Pap, NP  solifenacin (VESICARE) 10 MG tablet Take 1 tablet (10 mg total) by mouth daily. 07/01/13   Irene Pap, NP  sotalol (BETAPACE) 80 MG tablet Take 1.5 tablets (120 mg total) by mouth 2 (two) times daily. Take 1 and 1/2 tablet po 2 times daily 07/01/13   Irene Pap, NP  VITAMIN D, CHOLECALCIFEROL, PO Take 5,000 Units by mouth daily.    Historical Provider, MD    ALLERGIES:  Allergies  Allergen  Reactions  . Adhesive [Tape]   . Neosporin [Neomycin-Polymyxin-Gramicidin]     SOCIAL HISTORY:  History  Substance Use Topics  . Smoking status: Former Smoker    Quit date: 02/23/1993  . Smokeless tobacco: Never Used  . Alcohol Use: Yes    FAMILY HISTORY: Family History  Problem Relation Age of Onset  . Cancer Mother     colon  . Cancer Father   . Stroke Sister   . Cancer Brother   . Cancer Paternal Aunt     breast  . Heart disease Brother     EXAM: BP 152/79  Pulse 68  Temp(Src) 98.1 F (36.7 C) (Oral)  Resp 20  Ht 5\' 8"  (1.727 m)  Wt 166 lb (75.297 kg)  BMI 25.25 kg/m2  SpO2 98%  LMP  07/22/1968 CONSTITUTIONAL: Alert and oriented and responds appropriately to questions. Well-appearing; well-nourished; GCS 15 HEAD: Normocephalic; atraumatic EYES: Patient's left eye has normal extraocular movements, conjunctiva is not injected; patient has significant amount of periorbital ecchymosis and swelling to the right eye. There is also stenosis when the eyelids are opened. Her pupils are equal reactive bilaterally. Patient reports that she is only has light perception. Her extraocular movements of the right eye appear intact. There is no hyphema. No deformity of the pupil or obvious globe injury. ENT: normal nose; no rhinorrhea; moist mucous membranes; pharynx without lesions noted; no dental injury; no hemotypanum; no septal hematoma; minimum amount of epistaxis; midface is stable and nontender; patient is a 10 cm laceration above the right eyebrow that is oozing dark red blood; patient also has a 3 cm superficial laceration under the lower right eyelid that does not involve the lid margin NECK: Supple, no meningismus, no LAD; no midline spinal tenderness, step-off or deformity CARD: RRR; S1 and S2 appreciated; no murmurs, no clicks, no rubs, no gallops RESP: Normal chest excursion without splinting or tachypnea; breath sounds clear and equal bilaterally; no wheezes, no rhonchi, no rales; chest wall stable, nontender to palpation ABD/GI: Normal bowel sounds; non-distended; soft, non-tender, no rebound, no guarding PELVIS:  stable, nontender to palpation BACK:  The back appears normal and is non-tender to palpation, there is no CVA tenderness; no midline spinal tenderness, step-off or deformity EXT: Normal ROM in all joints; non-tender to palpation; no edema; normal capillary refill; no cyanosis    SKIN: Normal color for age and race; warm NEURO: Moves all extremities equally PSYCH: The patient's mood and manner are appropriate. Grooming and personal hygiene are appropriate.  MEDICAL  DECISION MAKING: Patient here with mechanical fall with head injury. She has significant periorbital ecchymosis and swelling, ecchymosis and has only light perception. I was called to bedside immediately for repair for patient's laceration above her right eyebrow given he was using a lot of blood.  When was repaired with good hemostasis. Will obtain labs, urine an emergent head CT, a CT and neck CT. No other sign of injury on exam.  ED PROGRESS: Chest labs are reassuring. Urine shows trace leukocytes but no other sign of infection. Head CT and cervical spine CT show no acute abnormality. Her CT of her face shows blowout fracture with displaced segmental fracture of the inferior orbital wall fracture extending into the medial orbital wall. There is also 7 mm of proptosis of the right globe compared to the left. The orbital nerve appears to be taught. There is nondisplaced fractures of the anterior medial walls of the right maxillary sinus.  Will emergently consult ophthalmology and  ENT.  Patient updated on plan, patient will likely need a lateral canthotomy. On reevaluation, patient's now has no vision in her left eye.   9:29 PM  Spoke with Dr. Janace Hoard with ENT. He does not feel there is any emergent condition that needs to be addressed from an ENT standpoint. We'll have her followup as an outpatient for her orbital blowout fracture. Have also discussed with Dr. Ellie Lunch with ophthalmology. She recommends attempting a lateral canthotomy in the emergency department. Have discussed the risks with the patient and she verbally agrees. Patient's eye pressure is between 85 and 89 mmHg in the left eye. Patient is complaining of mild pain now and nausea but no vomiting.  9:45 PM  Attempted lateral canthotomy that given the significant amount of swelling of patient's periorbital region, I am unable to access the lateral canthus without concerns for globe injury. Have discussed this with ophthalmology and they recommend  emergent transfer to [facility] emergency department. Pt given Diamox 500mg  po per ophtho recs prior to transfer.  Patient and husband are updated on this plan. Spoke with Dr. Reather Converse in the emergency department who agrees to accept the patient.  [facility] at bedside to transport pt emergently.   CRITICAL CARE Performed by: Nyra Jabs   Total critical care time: 30 minutes  Critical care time was exclusive of separately billable procedures and treating other patients.  Critical care was necessary to treat or prevent imminent or life-threatening deterioration.  Critical care was time spent personally by me on the following activities: development of treatment plan with patient and/or surrogate as well as nursing, discussions with consultants, evaluation of patient's response to treatment, examination of patient, obtaining history from patient or surrogate, ordering and performing treatments and interventions, ordering and review of laboratory studies, ordering and review of radiographic studies, pulse oximetry and re-evaluation of patient's condition.   LACERATION REPAIR Performed by: Nyra Jabs Authorized by: Nyra Jabs Consent: Verbal consent obtained. Risks and benefits: risks, benefits and alternatives were discussed Consent given by: patient Patient identity confirmed: provided demographic data Prepped and Draped in normal sterile fashion Wound explored  Laceration Location: right forehead  Laceration Length: 10 cm  No Foreign Bodies seen or palpated  Anesthesia: local infiltration  Local anesthetic: lidocaine 2 % with epinephrine  Anesthetic total: 10 ml  Irrigation method: syringe Amount of cleaning: standard  Skin closure: simple interrupted, superficial   Number of sutures: 11  Technique: simple interrupted, 4.0 prolene  Patient tolerance: Patient tolerated the procedure well with no immediate complications.  [place], DO 08/18/13  [place], DO 08/18/13 [place], DO 08/18/13 [place], DO 08/18/13 [id number]

## 2013-08-17 NOTE — Consult Note (Signed)
Reason for consult: traumatic retro-bulbar hemorrhage  HPI: Brittany [name] is an 80 y.o. female.  She fell tonight at 8:30 and hit right face on a shoe rack.  Presented to [facility] center [facility] ER.  She hit the right brow area and received sutures.  Her right eye was swollen shut with severe ecchymosis.  Per the ER doctor in [facility], her eye pressure was 56mm Hg and her vision was no light perception OD.  CT scan showed retro--bulbar hemorrhage and orbital fracture.  The ER doctor attempted canthotomy, but the swelling was so severe that the ER staff was afraid that the globe would be injured.  Thus, canthotomy was abandoned after opening the skin, and patient was emergently transferred to [facility] ER.  Prior to transfer, she received 500 mg of oral Diamox.     Past Ocular history: cataract surgery OU, she has macular degeneration and has been getting shots with Dr. Merlene [name] OD.  Past Medical History  Diagnosis Date  . Chicken pox   . GERD (gastroesophageal reflux disease)   . Hypertension   . Colon polyps   . Urine incontinence   . Depression   . Thyroid disease   . Urinary incontinence   . Atrial fibrillation 03/04/2013  . Sinus node dysfunction 03/04/2013  . Pacemaker-Medtronic 03/04/2013   Past Surgical History  Procedure Laterality Date  . Abdominal hysterectomy  1970  . Cholecystectomy  08/11/1999  . Tonsillectomy and adenoidectomy    . Pacemaker placement Right   . Cataract extraction     Family History  Problem Relation Age of Onset  . Cancer Mother     colon  . Cancer Father   . Stroke Sister   . Cancer Brother   . Cancer Paternal Aunt     breast  . Heart disease Brother    Current Facility-Administered Medications  Medication Dose Route Frequency Provider Last Rate Last Dose  . acetaZOLAMIDE (DIAMOX) tablet 500 mg  500 mg Oral Once Brittany N Ward, DO      . lidocaine-EPINEPHrine (XYLOCAINE W/EPI) 2 %-1:100000 (with pres) injection            Current  Outpatient Prescriptions  Medication Sig Dispense Refill  . amLODipine (NORVASC) 5 MG tablet Take 1 tablet (5 mg total) by mouth daily.  90 tablet  1  . aspirin 81 MG tablet Take 81 mg by mouth daily.      . benazepril-hydrochlorthiazide (LOTENSIN HCT) 20-12.5 MG per tablet Take 1 tablet by mouth daily.  90 tablet  1  . citalopram (CELEXA) 20 MG tablet Take 1 tablet (20 mg total) by mouth daily.  90 tablet  1  . Cyanocobalamin (B-12 PO) Take by mouth daily.      Marland Kitchen desmopressin (DDAVP) 0.2 MG tablet Take 1 tablet (0.2 mg total) by mouth daily.  90 tablet  1  . levothyroxine (SYNTHROID, LEVOTHROID) 25 MCG tablet Take 1 tablet (25 mcg total) by mouth daily before breakfast.  90 tablet  2  . Lutein-Zeaxanthin 25-5 MG CAPS Take by mouth daily.      . Misc Natural Products (OSTEO BI-FLEX ADV DOUBLE ST PO) Take by mouth 2 (two) times daily.      Marland Kitchen omeprazole (PRILOSEC) 20 MG capsule Take 1 capsule (20 mg total) by mouth daily.  90 capsule  1  . solifenacin (VESICARE) 10 MG tablet Take 1 tablet (10 mg total) by mouth daily.  90 tablet  0  . sotalol (BETAPACE) 80  MG tablet Take 1.5 tablets (120 mg total) by mouth 2 (two) times daily. Take 1 and 1/2 tablet po 2 times daily  135 tablet  1  . VITAMIN D, CHOLECALCIFEROL, PO Take 5,000 Units by mouth daily.       Allergies  Allergen Reactions  . Adhesive [Tape]   . Neosporin [Neomycin-Polymyxin-Gramicidin]    History   Social History  . Marital Status: Married    Spouse Name: N/A    Number of Children: 4  . Years of Education: 12   Occupational History  . Personell Manager      retired   Social History Main Topics  . Smoking status: Former Smoker    Quit date: 02/23/1993  . Smokeless tobacco: Never Used  . Alcohol Use: Yes  . Drug Use: No  . Sexual Activity: Not on file   Other Topics Concern  . Not on file   Social History Narrative   Brittany [name] lives with her husband at [employer]. They are retired and recently relocated to [facility] [place] [place] [facility]  from [place]. She has 4 grown children.    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    Review of systems: ROS:  General:  No fever or chills, GI:  + nausea.  (received Zofran in [facility]), CV: no chest pain, Pulm: no SOB, MS: no other parts of body hurt except face and eye.  Neuro:  +Headache,  Derm: no rash, Endo: no Diabetes, +hypothyroid  Physical Exam:   General:  Patient is lying on stretcher.  She appears mildly distressed, which she attributes to nausea Blood pressure 121/64, pulse 62, temperature 97.5 F (36.4 C), temperature source Oral, resp. rate 16, height $RemoveBe'5\' 8"'LVMZrPvKV$  (1.727 m), weight 75.297 kg (166 lb), last menstrual period 07/22/1968, SpO2 99.00%.   VA OD: No light perception    Pupils:   OD round, no reaction to light, + APD            OS round, reactive to light, no APD  IOP (T pen)  OD too high to read ("error" reading on tonopen)    CVF: unable OD.  NLP  Motility:  OD almost no movement.  She can only slightly adduct the right eye.  No upgaze or down gaze or abduction;  OS full ductions  Balance/alignment:  XT by Hirshberg   Bed side examination:                                 OD                                       External/adnexa: +Proptosis OD with tight, ecchymotic lids.  +sutured laceration above brow Lids/lashes:        Small superficial lower lid laceration not affecting lid margin, severe edema and ecchymosis of lids, linear opening in skin at lateral canthus where canthotomy was attempted Conjunctiva        Marked chemosis, temporal subconj hemorrhage Cornea:              Clear                  AC:                     Deep, no hyphema  Iris:                     Normal        Lens:                  PCIOL                                 OS                                       External/adnexa: Normal                                      Lids/lashes:        Normal                                       Conjunctiva        White, quiet        Cornea:              Clear                  AC:                     Deep,                                Iris:                     Normal        Lens:                  PCIOL   Emergent canthotomy and cantholysis were performed at the bedside with scissors under local anesthesia (2% lidocaine with epi) after betadine prep.  Blood did drain out of the canthotomy site.  The proptosis was dramatically reduced within minutes.  Lids much less tight.  Patient's nausea resolved.  She regained a small amount of ocular motility.  Vision rechecked OD:  Count Fingers at 1 foot.  IOP 43.  Lumigan OD x1, and Combigan OD x 2 given.    Erythromycin ophthalmic ointment applied to canthotomy and lower lid laceration  Dilated fundus exam: (Neo 2.5; Myd 1%)      OD Vitreous            Clear, quiet                                Optic Disc:       0.8 c:d, perfused            Macula:             Severe drusen and RPE atrophy.  No hemorrhage Vessels:           Normal caliber,distribution         Periphery:         Flat, attached  OS Vitreous            Clear, quiet                                Optic Disc:       Normal, perfused                      Macula:             Flat                                            Vessels:           Normal caliber,distribution         Periphery:         Flat, attached        Labs/studies: Results for orders placed during the hospital encounter of 08/17/13 (from the past 48 hour(s))  CBC WITH DIFFERENTIAL     Status: Abnormal   Collection Time    08/17/13  8:26 PM      Result Value Range   WBC 7.3  4.0 - 10.5 K/uL   RBC 4.10  3.87 - 5.11 MIL/uL   Hemoglobin 13.4  12.0 - 15.0 g/dL   HCT 38.0  36.0 - 46.0 %   MCV 92.7  78.0 - 100.0 fL   MCH 32.7  26.0 - 34.0 pg   MCHC 35.3  30.0 - 36.0 g/dL   RDW 12.5  11.5 - 15.5 %   Platelets 267  150 - 400 K/uL   Neutrophils Relative % 60  43 -  77 %   Neutro Abs 4.4  1.7 - 7.7 K/uL   Lymphocytes Relative 21  12 - 46 %   Lymphs Abs 1.5  0.7 - 4.0 K/uL   Monocytes Relative 15 (*) 3 - 12 %   Monocytes Absolute 1.1 (*) 0.1 - 1.0 K/uL   Eosinophils Relative 4  0 - 5 %   Eosinophils Absolute 0.3  0.0 - 0.7 K/uL   Basophils Relative 0  0 - 1 %   Basophils Absolute 0.0  0.0 - 0.1 K/uL  BASIC METABOLIC PANEL     Status: Abnormal   Collection Time    08/17/13  8:26 PM      Result Value Range   Sodium 130 (*) 137 - 147 mEq/L   Potassium 4.8  3.7 - 5.3 mEq/L   Chloride 90 (*) 96 - 112 mEq/L   CO2 24  19 - 32 mEq/L   Glucose, Bld 109 (*) 70 - 99 mg/dL   BUN 8  6 - 23 mg/dL   Creatinine, Ser 0.50  0.50 - 1.10 mg/dL   Calcium 10.4  8.4 - 10.5 mg/dL   GFR calc non Af Amer >90  >90 mL/min   GFR calc Af Amer >90  >90 mL/min   Comment: (NOTE)     The eGFR has been calculated using the CKD EPI equation.     This calculation has not been validated in all clinical situations.     eGFR's persistently <90 mL/min signify possible Chronic Kidney     Disease.  URINALYSIS, ROUTINE W REFLEX MICROSCOPIC     Status: Abnormal   Collection Time    08/17/13  9:16 PM  Result Value Range   Color, Urine YELLOW  YELLOW   APPearance CLEAR  CLEAR   Specific Gravity, Urine 1.008  1.005 - 1.030   pH 7.0  5.0 - 8.0   Glucose, UA NEGATIVE  NEGATIVE mg/dL   Hgb urine dipstick NEGATIVE  NEGATIVE   Bilirubin Urine NEGATIVE  NEGATIVE   Ketones, ur NEGATIVE  NEGATIVE mg/dL   Protein, ur NEGATIVE  NEGATIVE mg/dL   Urobilinogen, UA 0.2  0.0 - 1.0 mg/dL   Nitrite NEGATIVE  NEGATIVE   Leukocytes, UA TRACE (*) NEGATIVE  URINE MICROSCOPIC-ADD ON     Status: None   Collection Time    08/17/13  9:16 PM      Result Value Range   Squamous Epithelial / LPF RARE  RARE   WBC, UA 0-2  <3 WBC/hpf   RBC / HPF 0-2  <3 RBC/hpf   Ct Head Wo Contrast  08/17/2013   CLINICAL DATA:  Fall onto shoe rack  EXAM: CT HEAD WITHOUT CONTRAST  CT MAXILLOFACIAL WITHOUT CONTRAST   CT CERVICAL SPINE WITHOUT CONTRAST  TECHNIQUE: Multidetector CT imaging of the head, cervical spine, and maxillofacial structures were performed using the standard protocol without intravenous contrast. Multiplanar CT image reconstructions of the cervical spine and maxillofacial structures were also generated.  COMPARISON:  None.  FINDINGS: CT HEAD FINDINGS  Negative for acute intracranial hemorrhage, acute infarction, mass, mass effect, hydrocephalus or midline shift. Gray-white differentiation is preserved throughout. Central and cerebral atrophy with compensatory ex vacuo dilatation of the lateral ventricles. Advanced confluent periventricular, subcortical and deep white matter hypoattenuation which is nonspecific but most consistent with the sequelae of longstanding microvascular ischemia. Right periorbital preseptal soft tissue swelling consistent with hematoma / contusion. Atherosclerotic calcifications in the bilateral carotid siphons.  CT MAXILLOFACIAL FINDINGS  Right orbital blowout fracture. There is a displaced segmental fracture of the inferior orbital wall extending into the medial orbital wall. Fracture then anterior and medial walls of the maxillary sinus. There is associated right maxillary and right paranasal hemo sinus. Hematoma extends into the inferior aspect of the right orbit. There is marked proptosis of the right global by approximately 7 mm compared to the left. There is some concern for tension on the optic nerve.  CT CERVICAL SPINE FINDINGS  No acute fracture, malalignment or prevertebral soft tissue swelling. Extensive multilevel cervical spondylosis and facet arthropathy. Bulky degenerative change around the atlantodental intervals suggests underlying CPPD or rheumatoid arthritis. 1 cm low-attenuation nodule on the right thyroid gland. Bilateral carotid bifurcation atherosclerotic calcifications. No acute soft tissue abnormality. The lung apices are unremarkable.  IMPRESSION: CT HEAD:   1. No acute intracranial abnormality. 2. Central and cortical atrophy with compensatory ex vacuo dilatation of the lateral ventricles. 3. A advanced chronic microvascular ischemic white matter changes. 4. Intracranial atherosclerosis. CT MAXILLOFACIAL:  1. Right orbital blowout fracture with a displaced segmental fracture of the inferior orbital wall and fracture extending to the medial orbital wall. 2. 7 mm proptosis of the right globe relative to the left. The orbital nerve appears taut raising concern for tension on the nerve. This is at least partially due to hematoma in the inferior aspect of the orbit secondary to the underlying fracture site. 3. Nondisplaced fractures through the anterior and medial walls of the right maxillary sinus. 4. Associated right maxillary and nasal hemo sinus. CT C-SPINE:  1. No acute fracture or malalignment. 2. Extensive multilevel degenerative spondylosis. 3. Bulky degenerative change at the atlantodental interval suggests underlying CPPD  or rheumatoid arthritis. 4. Nonspecific 1 cm right thyroid nodule. 5. Bilateral carotid bifurcation atherosclerotic calcifications.  Critical Value/emergent results were called by telephone at the time of interpretation on 08/17/2013 at 9:14 PM to Dr. Pryor Curia , who verbally acknowledged these results.   Electronically Signed   By: Jacqulynn Cadet M.D.   On: 08/17/2013 21:15   Ct Cervical Spine Wo Contrast  08/17/2013   CLINICAL DATA:  Fall onto shoe rack  EXAM: CT HEAD WITHOUT CONTRAST  CT MAXILLOFACIAL WITHOUT CONTRAST  CT CERVICAL SPINE WITHOUT CONTRAST  TECHNIQUE: Multidetector CT imaging of the head, cervical spine, and maxillofacial structures were performed using the standard protocol without intravenous contrast. Multiplanar CT image reconstructions of the cervical spine and maxillofacial structures were also generated.  COMPARISON:  None.  FINDINGS: CT HEAD FINDINGS  Negative for acute intracranial hemorrhage, acute infarction,  mass, mass effect, hydrocephalus or midline shift. Gray-white differentiation is preserved throughout. Central and cerebral atrophy with compensatory ex vacuo dilatation of the lateral ventricles. Advanced confluent periventricular, subcortical and deep white matter hypoattenuation which is nonspecific but most consistent with the sequelae of longstanding microvascular ischemia. Right periorbital preseptal soft tissue swelling consistent with hematoma / contusion. Atherosclerotic calcifications in the bilateral carotid siphons.  CT MAXILLOFACIAL FINDINGS  Right orbital blowout fracture. There is a displaced segmental fracture of the inferior orbital wall extending into the medial orbital wall. Fracture then anterior and medial walls of the maxillary sinus. There is associated right maxillary and right paranasal hemo sinus. Hematoma extends into the inferior aspect of the right orbit. There is marked proptosis of the right global by approximately 7 mm compared to the left. There is some concern for tension on the optic nerve.  CT CERVICAL SPINE FINDINGS  No acute fracture, malalignment or prevertebral soft tissue swelling. Extensive multilevel cervical spondylosis and facet arthropathy. Bulky degenerative change around the atlantodental intervals suggests underlying CPPD or rheumatoid arthritis. 1 cm low-attenuation nodule on the right thyroid gland. Bilateral carotid bifurcation atherosclerotic calcifications. No acute soft tissue abnormality. The lung apices are unremarkable.  IMPRESSION: CT HEAD:  1. No acute intracranial abnormality. 2. Central and cortical atrophy with compensatory ex vacuo dilatation of the lateral ventricles. 3. A advanced chronic microvascular ischemic white matter changes. 4. Intracranial atherosclerosis. CT MAXILLOFACIAL:  1. Right orbital blowout fracture with a displaced segmental fracture of the inferior orbital wall and fracture extending to the medial orbital wall. 2. 7 mm proptosis of  the right globe relative to the left. The orbital nerve appears taut raising concern for tension on the nerve. This is at least partially due to hematoma in the inferior aspect of the orbit secondary to the underlying fracture site. 3. Nondisplaced fractures through the anterior and medial walls of the right maxillary sinus. 4. Associated right maxillary and nasal hemo sinus. CT C-SPINE:  1. No acute fracture or malalignment. 2. Extensive multilevel degenerative spondylosis. 3. Bulky degenerative change at the atlantodental interval suggests underlying CPPD or rheumatoid arthritis. 4. Nonspecific 1 cm right thyroid nodule. 5. Bilateral carotid bifurcation atherosclerotic calcifications.  Critical Value/emergent results were called by telephone at the time of interpretation on 08/17/2013 at 9:14 PM to Dr. Pryor Curia , who verbally acknowledged these results.   Electronically Signed   By: Jacqulynn Cadet M.D.   On: 08/17/2013 21:15   Ct Maxillofacial Wo Cm  08/17/2013   CLINICAL DATA:  Fall onto shoe rack  EXAM: CT HEAD WITHOUT CONTRAST  CT MAXILLOFACIAL WITHOUT CONTRAST  CT  CERVICAL SPINE WITHOUT CONTRAST  TECHNIQUE: Multidetector CT imaging of the head, cervical spine, and maxillofacial structures were performed using the standard protocol without intravenous contrast. Multiplanar CT image reconstructions of the cervical spine and maxillofacial structures were also generated.  COMPARISON:  None.  FINDINGS: CT HEAD FINDINGS  Negative for acute intracranial hemorrhage, acute infarction, mass, mass effect, hydrocephalus or midline shift. Gray-white differentiation is preserved throughout. Central and cerebral atrophy with compensatory ex vacuo dilatation of the lateral ventricles. Advanced confluent periventricular, subcortical and deep white matter hypoattenuation which is nonspecific but most consistent with the sequelae of longstanding microvascular ischemia. Right periorbital preseptal soft tissue swelling  consistent with hematoma / contusion. Atherosclerotic calcifications in the bilateral carotid siphons.  CT MAXILLOFACIAL FINDINGS  Right orbital blowout fracture. There is a displaced segmental fracture of the inferior orbital wall extending into the medial orbital wall. Fracture then anterior and medial walls of the maxillary sinus. There is associated right maxillary and right paranasal hemo sinus. Hematoma extends into the inferior aspect of the right orbit. There is marked proptosis of the right global by approximately 7 mm compared to the left. There is some concern for tension on the optic nerve.  CT CERVICAL SPINE FINDINGS  No acute fracture, malalignment or prevertebral soft tissue swelling. Extensive multilevel cervical spondylosis and facet arthropathy. Bulky degenerative change around the atlantodental intervals suggests underlying CPPD or rheumatoid arthritis. 1 cm low-attenuation nodule on the right thyroid gland. Bilateral carotid bifurcation atherosclerotic calcifications. No acute soft tissue abnormality. The lung apices are unremarkable.  IMPRESSION: CT HEAD:  1. No acute intracranial abnormality. 2. Central and cortical atrophy with compensatory ex vacuo dilatation of the lateral ventricles. 3. A advanced chronic microvascular ischemic white matter changes. 4. Intracranial atherosclerosis. CT MAXILLOFACIAL:  1. Right orbital blowout fracture with a displaced segmental fracture of the inferior orbital wall and fracture extending to the medial orbital wall. 2. 7 mm proptosis of the right globe relative to the left. The orbital nerve appears taut raising concern for tension on the nerve. This is at least partially due to hematoma in the inferior aspect of the orbit secondary to the underlying fracture site. 3. Nondisplaced fractures through the anterior and medial walls of the right maxillary sinus. 4. Associated right maxillary and nasal hemo sinus. CT C-SPINE:  1. No acute fracture or malalignment.  2. Extensive multilevel degenerative spondylosis. 3. Bulky degenerative change at the atlantodental interval suggests underlying CPPD or rheumatoid arthritis. 4. Nonspecific 1 cm right thyroid nodule. 5. Bilateral carotid bifurcation atherosclerotic calcifications.  Critical Value/emergent results were called by telephone at the time of interpretation on 08/17/2013 at 9:14 PM to Dr. Pryor Curia , who verbally acknowledged these results.   Electronically Signed   By: Jacqulynn Cadet M.D.   On: 08/17/2013 21:15                             Assessment: 1.  retrobulbar hemorrhage and orbital fracture OD.  See the above note for the description of the emergent canthotomy and cantholysis I performed.  Her eye pressure is improved, but still needs to be followed.  2. Macular degeneration  Plan: 1.  Erythromycin ophthalmic ointment to lacerations and canthotomy qid 2.  Brimonidine tid OD 3.  Dorzolamide tid OD 4.  Timolol 0.5% bid OD 5.  Latanoprost qd OD 6.  Diamox 500 mg sequel q 12 hours.  Start in am. 7.  Follow up with Dr  Emara Lichter tomorrow at her office at 10:00    All of the above information was relayed to the patient and/or patient family.  Ophthalmic warning signs and symptoms were reviewed, and clear instructions for immediate phone contact and/or immediate return to the ED or clinic were provided should any of these signs or symptoms occur.  Follow up contact information was provided.  All questions were answered.   Evelynn Hench L 08/17/2013, 11:13 PM  [facility] [phone]

## 2013-08-17 NOTE — ED Notes (Signed)
MD at bedside. 

## 2013-08-18 DIAGNOSIS — S0230XA Fracture of orbital floor, unspecified side, initial encounter for closed fracture: Secondary | ICD-10-CM | POA: Diagnosis not present

## 2013-08-18 DIAGNOSIS — H0589 Other disorders of orbit: Secondary | ICD-10-CM | POA: Diagnosis not present

## 2013-08-18 MED ORDER — ONDANSETRON 4 MG PO TBDP: ORAL_TABLET | ORAL | Status: DC

## 2013-08-18 NOTE — Discharge Instructions (Signed)
Follow up with the eye doctor tomorrow at 10 am and ENT doctor as directed. Use eye drops as directed. Apply antibiotics to wounds as directed.  If you were given medicines take as directed.  If you are on coumadin or contraceptives realize their levels and effectiveness is altered by many different medicines.  If you have any reaction (rash, tongues swelling, other) to the medicines stop taking and see a physician.   Please follow up as directed and return to the ER or see a physician for new or worsening symptoms.  Thank you.

## 2013-08-20 ENCOUNTER — Telehealth: Payer: Self-pay | Admitting: Nurse Practitioner

## 2013-08-20 DIAGNOSIS — H0589 Other disorders of orbit: Secondary | ICD-10-CM | POA: Diagnosis not present

## 2013-08-20 DIAGNOSIS — S0230XA Fracture of orbital floor, unspecified side, initial encounter for closed fracture: Secondary | ICD-10-CM | POA: Diagnosis not present

## 2013-08-20 DIAGNOSIS — H02849 Edema of unspecified eye, unspecified eyelid: Secondary | ICD-10-CM | POA: Diagnosis not present

## 2013-08-20 NOTE — Telephone Encounter (Signed)
If she is referring to stitches on forehead, she should have them removed within 5-7 days. She may come here for evaluation.

## 2013-08-20 NOTE — Telephone Encounter (Signed)
Patient returned call. Pt stated that she is having her stiches removed elsewhere next week.

## 2013-08-20 NOTE — Telephone Encounter (Signed)
Please advise 

## 2013-08-20 NOTE — Telephone Encounter (Signed)
LMOVM for patient to return call.

## 2013-08-20 NOTE — Telephone Encounter (Signed)
Patient fell Tues 08/17/13 & got stiches. She will need to get them removed. The hospital did not give her a date that they need to be removed. 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 Can she get the stiches out before her performance? She has a follow-up appt already scheduled for 09/02/13.

## 2013-08-24 DIAGNOSIS — S0230XA Fracture of orbital floor, unspecified side, initial encounter for closed fracture: Secondary | ICD-10-CM | POA: Diagnosis not present

## 2013-08-27 DIAGNOSIS — S0230XA Fracture of orbital floor, unspecified side, initial encounter for closed fracture: Secondary | ICD-10-CM | POA: Diagnosis not present

## 2013-08-31 DIAGNOSIS — H532 Diplopia: Secondary | ICD-10-CM | POA: Diagnosis not present

## 2013-08-31 DIAGNOSIS — S0230XB Fracture of orbital floor, unspecified side, initial encounter for open fracture: Secondary | ICD-10-CM | POA: Diagnosis not present

## 2013-08-31 DIAGNOSIS — H3530 Unspecified macular degeneration: Secondary | ICD-10-CM | POA: Diagnosis not present

## 2013-08-31 DIAGNOSIS — S0230XA Fracture of orbital floor, unspecified side, initial encounter for closed fracture: Secondary | ICD-10-CM | POA: Diagnosis not present

## 2013-09-02 ENCOUNTER — Ambulatory Visit (INDEPENDENT_AMBULATORY_CARE_PROVIDER_SITE_OTHER): Payer: Medicare Other | Admitting: Nurse Practitioner

## 2013-09-02 ENCOUNTER — Encounter: Payer: Self-pay | Admitting: Nurse Practitioner

## 2013-09-02 VITALS — BP 120/76 | HR 63 | Temp 98.6°F | Ht 66.17 in | Wt 169.2 lb

## 2013-09-02 DIAGNOSIS — I10 Essential (primary) hypertension: Secondary | ICD-10-CM

## 2013-09-02 DIAGNOSIS — K219 Gastro-esophageal reflux disease without esophagitis: Secondary | ICD-10-CM

## 2013-09-02 DIAGNOSIS — E871 Hypo-osmolality and hyponatremia: Secondary | ICD-10-CM | POA: Diagnosis not present

## 2013-09-02 DIAGNOSIS — E039 Hypothyroidism, unspecified: Secondary | ICD-10-CM | POA: Diagnosis not present

## 2013-09-02 DIAGNOSIS — R32 Unspecified urinary incontinence: Secondary | ICD-10-CM

## 2013-09-02 NOTE — Patient Instructions (Signed)
  I would like for you to stop DDAVP because it has made sodium too low, which can cause confusion and possibly falls. Do kegel exercises every day 10 times daily. For bladder training: go to bathroom every 1 1/2 hours to every 2 hours even if you do not feel you need to go. That will prevent over distension, which will help incontinence.  For blood pressure, take 1/2 tablet benazepril daily. Monitor blood pressure. Let us know if pressure gets higher than 150/90 on at least 2 readings. Continue amlodopine.  For sensation of food stuck in throat, please let me know if it gets worse or becomes more frequent & I will send you to GI doc.  Our office will call with lab results and any changes in thyroid medication needed. Kegel Exercises The goal of Kegel exercises is to isolate and exercise your pelvic floor muscles. These muscles act as a hammock that supports the rectum, vagina, small intestine, and uterus. As the muscles weaken, the hammock sags and these organs are displaced from their normal positions. Kegel exercises can strengthen your pelvic floor muscles and help you to improve bladder and bowel control, improve sexual response, and help reduce many problems and some discomfort during pregnancy. Kegel exercises can be done anywhere and at any time. HOW TO PERFORM KEGEL EXERCISES 1. Locate your pelvic floor muscles. To do this, squeeze (contract) the muscles that you use when you try to stop the flow of urine. You will feel a tightness in the vaginal area (women) and a tight lift in the rectal area (men and women). 2. When you begin, contract your pelvic muscles tight for 2 5 seconds, then relax them for 2 5 seconds. This is one set. Do 4 5 sets with a short pause in between. 3. Contract your pelvic muscles for 8 10 seconds, then relax them for 8 10 seconds. Do 4 5 sets. If you cannot contract your pelvic muscles for 8 10 seconds, try 5 7 seconds and work your way up to 8 10 seconds. Your goal is 4 5  sets of 10 contractions each day. Keep your stomach, buttocks, and legs relaxed during the exercises. Perform sets of both short and long contractions. Vary your positions. Perform these contractions 3 4 times per day. Perform sets while you are:   Lying in bed in the morning.  Standing at lunch.  Sitting in the late afternoon.  Lying in bed at night. You should do 40 50 contractions per day. Do not perform more Kegel exercises per day than recommended. Overexercising can cause muscle fatigue. Continue these exercises for for at least 15 20 weeks or as directed by your caregiver. Document Released: 06/24/2012 Document Reviewed: 06/24/2012 [facility] Patient Information 2014 [place], [place].

## 2013-09-02 NOTE — Progress Notes (Signed)
Pre-visit discussion using our clinic review tool. No additional management support is needed unless otherwise documented below in the visit note.  

## 2013-09-03 ENCOUNTER — Encounter: Payer: Self-pay | Admitting: Nurse Practitioner

## 2013-09-03 LAB — TSH: TSH: 2.01 u[IU]/mL (ref 0.35–5.50)

## 2013-09-03 LAB — BASIC METABOLIC PANEL
BUN: 9 mg/dL (ref 6–23)
CO2: 26 mEq/L (ref 19–32)
Calcium: 10.7 mg/dL — ABNORMAL HIGH (ref 8.4–10.5)
Chloride: 93 mEq/L — ABNORMAL LOW (ref 96–112)
Creatinine, Ser: 0.5 mg/dL (ref 0.4–1.2)
GFR: 133.17 mL/min (ref >60.00)
Glucose, Bld: 102 mg/dL — ABNORMAL HIGH (ref 70–99)
Potassium: 4.5 mEq/L (ref 3.5–5.1)
Sodium: 128 mEq/L — ABNORMAL LOW (ref 135–145)

## 2013-09-03 NOTE — Assessment & Plan Note (Signed)
Sodium 130 at hospital encounter for fall 3 weeks ago. Likely due to DDAVP.  Recommend bladder training: toilet every 1 1/2h to 2h. Kegels 10 times daily. Stop DDAVP except for trips, events. May continue vesicare, but prefer bladder training.

## 2013-09-03 NOTE — Assessment & Plan Note (Signed)
Current meds: 25 mcg synthroid. No s&S of hyper or hypo thyroid. TSH today.

## 2013-09-03 NOTE — Assessment & Plan Note (Signed)
Excellent control, but recent fall-pt states was not dizzy. Current meds: norvasc 5 mg qd, lotensin 20/12.5. Will decrease lotensin to 10/6 (1/2 T qd).  Goal: <150/90

## 2013-09-03 NOTE — Assessment & Plan Note (Signed)
Continue omeprazole 20 mg. Pt to let me know if food/pills are getting stuck in throat. I will refer to GI.

## 2013-09-03 NOTE — Progress Notes (Signed)
Subjective:     Brittany [name] is a 80 y.o. female. She returns for f/u of chronic conditions: HTN, GERD, hypothyroid, Incontinence. She fell at home 3 weeks ago, suffering orbital fracture & lac to forehead. She states she was leaning over to get a pair of shoes & lost balance, falling into shoe rack. See overview for these DX in prob list for HPI details.    The following portions of the patient's history were reviewed and updated as appropriate: allergies, current medications, past medical history, past social history, past surgical history and problem list.  Review of Systems Constitutional: negative for chills, fatigue, fevers and night sweats Eyes: f/u w/opthalmologist for orbital fracture Ears, nose, mouth, throat, and face: bruised face, healing forehead lac Respiratory: negative for cough and sputum Cardiovascular: negative for chest pain, chest pressure/discomfort, irregular heart beat, lower extremity edema and near-syncope Gastrointestinal: positive for feeling like food gets stuck in throat, negative for abdominal pain and change in bowel habits Genitourinary:taking vesicare & DDAVP for frequency & incontinence for several years. Endocrine: negative for temperature intolerance    Objective:    BP 120/76  Pulse 63  Temp(Src) 98.6 F (37 C) (Temporal)  Ht 5' 6.17" (1.681 m)  Wt 169 lb 4 oz (76.771 kg)  BMI 27.17 kg/m2  SpO2 98%  LMP 07/22/1968 BP 120/76  Pulse 63  Temp(Src) 98.6 F (37 C) (Temporal)  Ht 5' 6.17" (1.681 m)  Wt 169 lb 4 oz (76.771 kg)  BMI 27.17 kg/m2  SpO2 98%  LMP 07/22/1968 General appearance: alert, cooperative, appears stated age and no distress Head: Normocephalic, without obvious abnormality, Battle's sign, resolving bruises bilat cheeks, covered w/heavy make-up. Eyes: R conjunctive slightly injected, eye lid slight droop due to lateral lid release at time of injury Lungs: clear to auscultation bilaterally Heart: regular rate and rhythm, S1, S2  normal, no murmur, click, rub or gallop Extremities: extremities normal, atraumatic, no cyanosis or edema Pulses: 2+ and symmetric Skin: ecchymoses bilat cheeks. well approximated pink scar R forehead, healing stage    Assessment & plan:    see prob list for a&p  Stop DDAVP, recheck sodium.

## 2013-09-03 NOTE — Telephone Encounter (Signed)
error 

## 2013-09-06 ENCOUNTER — Telehealth: Payer: Self-pay | Admitting: Nurse Practitioner

## 2013-09-06 DIAGNOSIS — E871 Hypo-osmolality and hyponatremia: Secondary | ICD-10-CM

## 2013-09-06 NOTE — Telephone Encounter (Signed)
Spoke with pt advised to have labs rechecked in two weeks. Pt agreed and was sent to [place] for lab appointment.

## 2013-09-07 DIAGNOSIS — IMO0001 Reserved for inherently not codable concepts without codable children: Secondary | ICD-10-CM | POA: Diagnosis not present

## 2013-09-07 DIAGNOSIS — H3530 Unspecified macular degeneration: Secondary | ICD-10-CM | POA: Diagnosis not present

## 2013-09-09 DIAGNOSIS — H353290 Exudative age-related macular degeneration, unspecified eye, stage unspecified: Secondary | ICD-10-CM | POA: Diagnosis not present

## 2013-09-09 DIAGNOSIS — H26499 Other secondary cataract, unspecified eye: Secondary | ICD-10-CM | POA: Diagnosis not present

## 2013-09-09 DIAGNOSIS — H2640 Unspecified secondary cataract: Secondary | ICD-10-CM | POA: Diagnosis not present

## 2013-09-10 ENCOUNTER — Ambulatory Visit (INDEPENDENT_AMBULATORY_CARE_PROVIDER_SITE_OTHER): Payer: Medicare Other | Admitting: *Deleted

## 2013-09-10 ENCOUNTER — Encounter: Payer: Self-pay | Admitting: Nurse Practitioner

## 2013-09-10 ENCOUNTER — Encounter: Payer: Self-pay | Admitting: Internal Medicine

## 2013-09-10 DIAGNOSIS — Z8679 Personal history of other diseases of the circulatory system: Secondary | ICD-10-CM | POA: Diagnosis not present

## 2013-09-10 DIAGNOSIS — I4891 Unspecified atrial fibrillation: Secondary | ICD-10-CM | POA: Diagnosis not present

## 2013-09-10 DIAGNOSIS — I495 Sick sinus syndrome: Secondary | ICD-10-CM

## 2013-09-10 DIAGNOSIS — S0230XA Fracture of orbital floor, unspecified side, initial encounter for closed fracture: Secondary | ICD-10-CM | POA: Insufficient documentation

## 2013-09-10 LAB — MDC_IDC_ENUM_SESS_TYPE_REMOTE
Battery Impedance: 202 Ohm
Battery Remaining Longevity: 118 mo
Battery Voltage: 2.79 V
Brady Statistic AP VP Percent: 0 %
Brady Statistic AP VS Percent: 100 %
Brady Statistic AS VP Percent: 0 %
Brady Statistic AS VS Percent: 0 %
Date Time Interrogation Session: 20150220113610
Lead Channel Impedance Value: 463 Ohm
Lead Channel Impedance Value: 494 Ohm
Lead Channel Pacing Threshold Amplitude: 0.625 V
Lead Channel Pacing Threshold Amplitude: 0.875 V
Lead Channel Pacing Threshold Pulse Width: 0.4 ms
Lead Channel Pacing Threshold Pulse Width: 0.4 ms
Lead Channel Sensing Intrinsic Amplitude: 11.2 mV
Lead Channel Setting Pacing Amplitude: 1.75 V
Lead Channel Setting Pacing Amplitude: 2 V
Lead Channel Setting Pacing Pulse Width: 0.4 ms
Lead Channel Setting Sensing Sensitivity: 5.6 mV

## 2013-09-15 ENCOUNTER — Telehealth: Payer: Self-pay | Admitting: Nurse Practitioner

## 2013-09-15 NOTE — Telephone Encounter (Signed)
Please advise 

## 2013-09-15 NOTE — Telephone Encounter (Signed)
Patient switched insurance and needs to have a new script and OV notes (an OV that discusses her need for the CPap) faxed to [place], call patient with questions

## 2013-09-17 ENCOUNTER — Telehealth: Payer: Self-pay | Admitting: Internal Medicine

## 2013-09-17 NOTE — Telephone Encounter (Signed)
Looks like we need to do this process again? What do you need from me?

## 2013-09-17 NOTE — Telephone Encounter (Signed)
Need a written prescription.

## 2013-09-17 NOTE — Telephone Encounter (Signed)
Informed patient that her [facility] was received and that she will get a letter once [facility] has reviewed the transmission.

## 2013-09-17 NOTE — Telephone Encounter (Signed)
Follow up    Pt called to say she got an emil say the office did not get her remote check.  Pt says she did the transmition please give her a call back.

## 2013-09-17 NOTE — Telephone Encounter (Signed)
Follow Up  Pt received a letter// No-Show remote check// states she sent it in// requesting a call back to discuss why it wasn't received// please assist

## 2013-09-20 ENCOUNTER — Other Ambulatory Visit (INDEPENDENT_AMBULATORY_CARE_PROVIDER_SITE_OTHER): Payer: Medicare Other

## 2013-09-20 DIAGNOSIS — E871 Hypo-osmolality and hyponatremia: Secondary | ICD-10-CM

## 2013-09-20 DIAGNOSIS — Z0000 Encounter for general adult medical examination without abnormal findings: Secondary | ICD-10-CM

## 2013-09-20 LAB — BASIC METABOLIC PANEL
BUN: 12 mg/dL (ref 6–23)
CO2: 30 mEq/L (ref 19–32)
Calcium: 10.4 mg/dL (ref 8.4–10.5)
Chloride: 100 mEq/L (ref 96–112)
Creatinine, Ser: 0.7 mg/dL (ref 0.4–1.2)
GFR: 87.59 mL/min (ref >60.00)
Glucose, Bld: 94 mg/dL (ref 70–99)
Potassium: 4.6 mEq/L (ref 3.5–5.1)
Sodium: 135 mEq/L (ref 135–145)

## 2013-09-20 NOTE — Telephone Encounter (Signed)
Faxed script and ov notes to [place].

## 2013-09-20 NOTE — Telephone Encounter (Signed)
Script written

## 2013-09-21 LAB — URINALYSIS, ROUTINE W REFLEX MICROSCOPIC

## 2013-10-05 ENCOUNTER — Telehealth: Payer: Self-pay

## 2013-10-05 NOTE — Telephone Encounter (Signed)
Pt called and left message on voicemail wanting to see if Brittany [name] can recommend an orthopedic spinal surgeon for her back pain. She states she used to get back injections in her back for this in the city she lived in previously. Did she need to come in for eval/back pain or can we just refer her/ Please advise.

## 2013-10-05 NOTE — Telephone Encounter (Signed)
She needs appt. Pls adv pt.

## 2013-10-06 NOTE — Telephone Encounter (Signed)
Spoke with pt advised she would need an office visit first before we can refer her to orthopedic. I made her an appt for Friday 3/20 at 10:00 am

## 2013-10-08 ENCOUNTER — Encounter: Payer: Self-pay | Admitting: Nurse Practitioner

## 2013-10-08 ENCOUNTER — Ambulatory Visit (INDEPENDENT_AMBULATORY_CARE_PROVIDER_SITE_OTHER): Payer: Medicare Other | Admitting: Nurse Practitioner

## 2013-10-08 ENCOUNTER — Encounter: Payer: Self-pay | Admitting: Gastroenterology

## 2013-10-08 VITALS — BP 125/79 | HR 70 | Temp 98.4°F | Ht 66.17 in | Wt 172.0 lb

## 2013-10-08 DIAGNOSIS — K219 Gastro-esophageal reflux disease without esophagitis: Secondary | ICD-10-CM | POA: Diagnosis not present

## 2013-10-08 DIAGNOSIS — M545 Low back pain, unspecified: Secondary | ICD-10-CM | POA: Diagnosis not present

## 2013-10-08 DIAGNOSIS — M47819 Spondylosis without myelopathy or radiculopathy, site unspecified: Secondary | ICD-10-CM | POA: Insufficient documentation

## 2013-10-08 DIAGNOSIS — R0989 Other specified symptoms and signs involving the circulatory and respiratory systems: Secondary | ICD-10-CM

## 2013-10-08 DIAGNOSIS — R198 Other specified symptoms and signs involving the digestive system and abdomen: Secondary | ICD-10-CM

## 2013-10-08 DIAGNOSIS — F458 Other somatoform disorders: Secondary | ICD-10-CM

## 2013-10-08 DIAGNOSIS — R09A2 Foreign body sensation, throat: Secondary | ICD-10-CM

## 2013-10-08 DIAGNOSIS — L905 Scar conditions and fibrosis of skin: Secondary | ICD-10-CM | POA: Diagnosis not present

## 2013-10-08 NOTE — Progress Notes (Signed)
Pre visit review using our clinic review tool, if applicable. No additional management support is needed unless otherwise documented below in the visit note. 

## 2013-10-08 NOTE — Assessment & Plan Note (Signed)
L sided lumbar pain. No radiation. Neg straight leg raise. Recommend daily back stretches. Avoid torsion.  Ref to [facility] [place].

## 2013-10-08 NOTE — Assessment & Plan Note (Signed)
Pt reports feeling like food is getting stuck in throat again. She has had to purge recently because she could not swallow. She has had 2 esophageal stretchings in past in [place].  Will refer to GI. Continue omeprazole qd. Chew foods thoroughly. Do not rush when eating. Sip fluids. Avoid dry meats.

## 2013-10-08 NOTE — Progress Notes (Signed)
Subjective:     Patient ID: Brittany [name] is a 80 y.o. female. She presents w/recurrent back pain, & recurrent globus sensation. Additionally she inquires about scar care. Re back pain: she describes worsened pain with prolonged sitting & torsion. She has had chiropractor treatments in past as well as spinal injections X 3 with good results. She thinks last inj. was about 1 year ago. She is not stretching on daily basis. Re globus sensation: she describes food getting "stuck" in throat She had to purge recently because could not get food down. Has had 2 esophageal stretchings in past. She takes omeprazole daily. She has scar on R forehead from fall about 8 weeks ago & asks how she can protect it.   The following portions of the patient's history were reviewed and updated as appropriate: allergies, current medications, past medical history, past surgical history and problem list.  Review of Systems Pertinent items are noted in HPI.    Objective:    BP 125/79  Pulse 70  Temp(Src) 98.4 F (36.9 C) (Temporal)  Ht 5' 6.17" (1.681 m)  Wt 172 lb (78.019 kg)  BMI 27.61 kg/m2  SpO2 98%  LMP 07/22/1968 BP 125/79  Pulse 70  Temp(Src) 98.4 F (36.9 C) (Temporal)  Ht 5' 6.17" (1.681 m)  Wt 172 lb (78.019 kg)  BMI 27.61 kg/m2  SpO2 98%  LMP 07/22/1968 General appearance: alert, cooperative, appears stated age and no distress Head: Normocephalic, without obvious abnormality, atraumatic Back: no scoliosis present, range of motion normal, neg straight & crossed leg raise. tenderness at palpation to mid lumbar region. No L hip tenderness to palpation at bursa. Skin: R forehead scar. pink. well approximated. Still in healing stage.    Assessment:     1. Left lumbar pain   2. Globus sensation   3. Scar of face   4. GERD (gastroesophageal reflux disease)       Plan:     See prob list for complete A&P Next appt. In June for f/u of chronic disease.

## 2013-10-08 NOTE — Patient Instructions (Signed)
Please chew food thoroughly & drink plenty of fluids. Do not rush when eating. Continue omeprazole.  Perform back stretches daily. Avoid twisting torso-toe should lead you. Use golfer's lift when bending.  Keep scar on forehead covered w/suncreen SPF 30. You may use mederma also or break open vitamin E capsule & apply daily to scar. See you in June!  Back Exercises Back exercises help treat and prevent back injuries. The goal of back exercises is to increase the strength of your abdominal and back muscles and the flexibility of your back. These exercises should be started when you no longer have back pain. Back exercises include:  Pelvic Tilt. Lie on your back with your knees bent. Tilt your pelvis until the lower part of your back is against the floor. Hold this position 5 to 10 sec and repeat 5 to 10 times.  Knee to Chest. Pull first 1 knee up against your chest and hold for 20 to 30 seconds, repeat this with the other knee, and then both knees. This may be done with the other leg straight or bent, whichever feels better.  Sit-Ups or Curl-Ups. Bend your knees 90 degrees. Start with tilting your pelvis, and do a partial, slow sit-up, lifting your trunk only 30 to 45 degrees off the floor. Take at least 2 to 3 seconds for each sit-up. Do not do sit-ups with your knees out straight. If partial sit-ups are difficult, simply do the above but with only tightening your abdominal muscles and holding it as directed.  Hip-Lift. Lie on your back with your knees flexed 90 degrees. Push down with your feet and shoulders as you raise your hips a couple inches off the floor; hold for 10 seconds, repeat 5 to 10 times.  Shoulder-Lifts. Lie face down with arms beside your body. Keep hips and torso pressed to floor as you slowly lift your head and shoulders off the floor. Do not overdo your exercises, especially in the beginning. Exercises may cause you some mild back discomfort which lasts for a few minutes;  however, if the pain is more severe, or lasts for more than 15 minutes, do not continue exercises until you see your caregiver. Improvement with exercise therapy for back problems is slow.  See your caregivers for assistance with developing a proper back exercise program. Document Released: 08/15/2004 Document Revised: 09/30/2011 Document Reviewed: 05/09/2011 [facility] Patient Information 2014 [place].

## 2013-10-11 ENCOUNTER — Encounter: Payer: Self-pay | Admitting: *Deleted

## 2013-10-12 DIAGNOSIS — IMO0001 Reserved for inherently not codable concepts without codable children: Secondary | ICD-10-CM | POA: Diagnosis not present

## 2013-10-12 DIAGNOSIS — H3530 Unspecified macular degeneration: Secondary | ICD-10-CM | POA: Diagnosis not present

## 2013-10-15 DIAGNOSIS — M545 Low back pain, unspecified: Secondary | ICD-10-CM | POA: Diagnosis not present

## 2013-10-15 DIAGNOSIS — M999 Biomechanical lesion, unspecified: Secondary | ICD-10-CM | POA: Diagnosis not present

## 2013-10-15 DIAGNOSIS — M5430 Sciatica, unspecified side: Secondary | ICD-10-CM | POA: Diagnosis not present

## 2013-10-18 DIAGNOSIS — M999 Biomechanical lesion, unspecified: Secondary | ICD-10-CM | POA: Diagnosis not present

## 2013-10-18 DIAGNOSIS — M545 Low back pain, unspecified: Secondary | ICD-10-CM | POA: Diagnosis not present

## 2013-10-18 DIAGNOSIS — M5430 Sciatica, unspecified side: Secondary | ICD-10-CM | POA: Diagnosis not present

## 2013-10-25 DIAGNOSIS — M999 Biomechanical lesion, unspecified: Secondary | ICD-10-CM | POA: Diagnosis not present

## 2013-10-25 DIAGNOSIS — M5430 Sciatica, unspecified side: Secondary | ICD-10-CM | POA: Diagnosis not present

## 2013-10-25 DIAGNOSIS — M545 Low back pain, unspecified: Secondary | ICD-10-CM | POA: Diagnosis not present

## 2013-10-27 ENCOUNTER — Other Ambulatory Visit: Payer: Self-pay

## 2013-10-27 MED ORDER — SOTALOL HCL 80 MG PO TABS: 120.0000 mg | ORAL_TABLET | Freq: Three times a day (TID) | ORAL | Status: DC

## 2013-10-28 DIAGNOSIS — M545 Low back pain, unspecified: Secondary | ICD-10-CM | POA: Diagnosis not present

## 2013-10-28 DIAGNOSIS — M5430 Sciatica, unspecified side: Secondary | ICD-10-CM | POA: Diagnosis not present

## 2013-10-28 DIAGNOSIS — M999 Biomechanical lesion, unspecified: Secondary | ICD-10-CM | POA: Diagnosis not present

## 2013-10-28 DIAGNOSIS — M47817 Spondylosis without myelopathy or radiculopathy, lumbosacral region: Secondary | ICD-10-CM | POA: Diagnosis not present

## 2013-10-28 DIAGNOSIS — M5137 Other intervertebral disc degeneration, lumbosacral region: Secondary | ICD-10-CM | POA: Diagnosis not present

## 2013-10-29 ENCOUNTER — Encounter: Payer: Self-pay | Admitting: Nurse Practitioner

## 2013-11-02 DIAGNOSIS — M5430 Sciatica, unspecified side: Secondary | ICD-10-CM | POA: Diagnosis not present

## 2013-11-02 DIAGNOSIS — M999 Biomechanical lesion, unspecified: Secondary | ICD-10-CM | POA: Diagnosis not present

## 2013-11-02 DIAGNOSIS — M545 Low back pain, unspecified: Secondary | ICD-10-CM | POA: Diagnosis not present

## 2013-11-05 DIAGNOSIS — M999 Biomechanical lesion, unspecified: Secondary | ICD-10-CM | POA: Diagnosis not present

## 2013-11-05 DIAGNOSIS — M545 Low back pain, unspecified: Secondary | ICD-10-CM | POA: Diagnosis not present

## 2013-11-05 DIAGNOSIS — M5430 Sciatica, unspecified side: Secondary | ICD-10-CM | POA: Diagnosis not present

## 2013-11-08 DIAGNOSIS — M5430 Sciatica, unspecified side: Secondary | ICD-10-CM | POA: Diagnosis not present

## 2013-11-08 DIAGNOSIS — M545 Low back pain, unspecified: Secondary | ICD-10-CM | POA: Diagnosis not present

## 2013-11-08 DIAGNOSIS — M999 Biomechanical lesion, unspecified: Secondary | ICD-10-CM | POA: Diagnosis not present

## 2013-11-12 DIAGNOSIS — M545 Low back pain, unspecified: Secondary | ICD-10-CM | POA: Diagnosis not present

## 2013-11-12 DIAGNOSIS — M999 Biomechanical lesion, unspecified: Secondary | ICD-10-CM | POA: Diagnosis not present

## 2013-11-12 DIAGNOSIS — M5430 Sciatica, unspecified side: Secondary | ICD-10-CM | POA: Diagnosis not present

## 2013-11-15 DIAGNOSIS — M545 Low back pain, unspecified: Secondary | ICD-10-CM | POA: Diagnosis not present

## 2013-11-15 DIAGNOSIS — M47817 Spondylosis without myelopathy or radiculopathy, lumbosacral region: Secondary | ICD-10-CM | POA: Diagnosis not present

## 2013-11-15 DIAGNOSIS — M5137 Other intervertebral disc degeneration, lumbosacral region: Secondary | ICD-10-CM | POA: Diagnosis not present

## 2013-11-16 DIAGNOSIS — M999 Biomechanical lesion, unspecified: Secondary | ICD-10-CM | POA: Diagnosis not present

## 2013-11-16 DIAGNOSIS — M545 Low back pain, unspecified: Secondary | ICD-10-CM | POA: Diagnosis not present

## 2013-11-16 DIAGNOSIS — M5430 Sciatica, unspecified side: Secondary | ICD-10-CM | POA: Diagnosis not present

## 2013-11-17 DIAGNOSIS — L570 Actinic keratosis: Secondary | ICD-10-CM | POA: Diagnosis not present

## 2013-11-18 ENCOUNTER — Encounter: Payer: Self-pay | Admitting: Nurse Practitioner

## 2013-11-18 DIAGNOSIS — M5430 Sciatica, unspecified side: Secondary | ICD-10-CM | POA: Diagnosis not present

## 2013-11-18 DIAGNOSIS — M545 Low back pain, unspecified: Secondary | ICD-10-CM | POA: Diagnosis not present

## 2013-11-18 DIAGNOSIS — M999 Biomechanical lesion, unspecified: Secondary | ICD-10-CM | POA: Diagnosis not present

## 2013-11-23 ENCOUNTER — Encounter: Payer: Self-pay | Admitting: Gastroenterology

## 2013-11-23 ENCOUNTER — Ambulatory Visit (INDEPENDENT_AMBULATORY_CARE_PROVIDER_SITE_OTHER): Payer: Medicare Other | Admitting: Gastroenterology

## 2013-11-23 VITALS — BP 120/62 | HR 72 | Ht 66.0 in | Wt 174.6 lb

## 2013-11-23 DIAGNOSIS — R1314 Dysphagia, pharyngoesophageal phase: Secondary | ICD-10-CM

## 2013-11-23 DIAGNOSIS — M999 Biomechanical lesion, unspecified: Secondary | ICD-10-CM | POA: Diagnosis not present

## 2013-11-23 DIAGNOSIS — M545 Low back pain, unspecified: Secondary | ICD-10-CM | POA: Diagnosis not present

## 2013-11-23 DIAGNOSIS — M5430 Sciatica, unspecified side: Secondary | ICD-10-CM | POA: Diagnosis not present

## 2013-11-23 NOTE — Progress Notes (Signed)
HPI: This is a  very pleasant 80 year old woman whom I am meeting for the first time today. She is here with her husband. They both recently moved from [place] a year or 2 ago.   Was ahving swalllwing issues, dysphagia for 2 months, solids only.  No weight loss.  EGD three times in [place] (reports very incomplete), she tells me her esophagus was dilated. The dilations helped her dysphagia.  Dysphagia returned about 2 weeks ago.  Intermittent dysphagia (apples, dry food, some pills).  Not daily.  Will usually drink some sprite and it will help.  Not as bad a prior to stretchings (would have to regurge)  There was an upper endoscopy report from an outside provider dated May 2014. This showed to pictures of her upper GI tract and there was no associated text. It was a very incomplete report. Biopsies from I believe that same procedures were documented to have been "normal" by handwritten report.  She is on omeprazole, has been for 2 weeks, was on previously.  Review of systems: Pertinent positive and negative review of systems were noted in the above HPI section. Complete review of systems was performed and was otherwise normal.    Past Medical History  Diagnosis Date  . Chicken pox   . GERD (gastroesophageal reflux disease)   . Hypertension   . Colon polyps   . Urine incontinence   . Depression   . Thyroid disease   . Urinary incontinence   . Atrial fibrillation 03/04/2013  . Sinus node dysfunction 03/04/2013  . Pacemaker-Medtronic 03/04/2013    Past Surgical History  Procedure Laterality Date  . Abdominal hysterectomy  1970  . Cholecystectomy  08/11/1999  . Tonsillectomy and adenoidectomy    . Pacemaker placement Right   . Cataract extraction      Current Outpatient Prescriptions  Medication Sig Dispense Refill  . amLODipine (NORVASC) 5 MG tablet Take 1 tablet (5 mg total) by mouth daily.  90 tablet  1  . aspirin 81 MG tablet Take 81 mg by mouth daily.      . B Complex-C  (B-COMPLEX WITH VITAMIN C) tablet Take 1 tablet by mouth daily.      . benazepril-hydrochlorthiazide (LOTENSIN HCT) 20-12.5 MG per tablet Take 1 tablet by mouth daily.  90 tablet  1  . citalopram (CELEXA) 20 MG tablet Take 1 tablet (20 mg total) by mouth daily.  90 tablet  1  . desmopressin (DDAVP) 0.2 MG tablet Take 1 tablet (0.2 mg total) by mouth daily.  90 tablet  1  . levothyroxine (SYNTHROID, LEVOTHROID) 25 MCG tablet Take 1 tablet (25 mcg total) by mouth daily before breakfast.  90 tablet  2  . Lutein-Zeaxanthin 25-5 MG CAPS Take by mouth daily.      . Multiple Vitamin (MULTIVITAMIN WITH MINERALS) TABS tablet Take 1 tablet by mouth daily.      . Omega-3 Fatty Acids (FISH OIL) 1200 MG CAPS Take 1 capsule by mouth 2 (two) times daily.      Marland Kitchen omeprazole (PRILOSEC) 20 MG capsule Take 1 capsule (20 mg total) by mouth daily.  90 capsule  1  . solifenacin (VESICARE) 10 MG tablet Take 1 tablet (10 mg total) by mouth daily.  90 tablet  0  . sotalol (BETAPACE) 80 MG tablet Take 1.5 tablets (120 mg total) by mouth 3 (three) times daily.  120 tablet  0  . Misc Natural Products (OSTEO BI-FLEX ADV DOUBLE ST PO) Take by mouth 2 (two)  times daily.       No current facility-administered medications for this visit.    Allergies as of 11/23/2013 - Review Complete 11/23/2013  Allergen Reaction Noted  . Adhesive [tape] Swelling and Rash 02/23/2013  . Neosporin [neomycin-polymyxin-gramicidin] Swelling and Rash 02/23/2013    Family History  Problem Relation Age of Onset  . Cancer Mother     colon  . Cancer Father   . Stroke Sister   . Cancer Brother   . Cancer Paternal Aunt     breast  . Heart disease Brother     History   Social History  . Marital Status: Married    Spouse Name: N/A    Number of Children: 4  . Years of Education: 12   Occupational History  . [employer] Manager      retired   Social History Main Topics  . Smoking status: Former Smoker    Quit date: 02/23/1993  .  Smokeless tobacco: Never Used  . Alcohol Use: Yes  . Drug Use: No  . Sexual Activity: Not on file   Other Topics Concern  . Not on file   Social History Narrative   Ms. Balestrieri lives with her husband at [employer]. They are retired and recently relocated to [facility] from [place]. She has 4 grown children.    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       Physical Exam: BP 120/62  Pulse 72  Ht 5\' 6"  (1.676 m)  Wt 174 lb 9.6 oz (79.198 kg)  BMI 28.19 kg/m2  LMP 07/22/1968 Constitutional: generally well-appearing Psychiatric: alert and oriented x3 Eyes: extraocular movements intact Mouth: oral pharynx moist, no lesions Neck: supple no lymphadenopathy Cardiovascular: heart regular rate and rhythm Lungs: clear to auscultation bilaterally Abdomen: soft, nontender, nondistended, no obvious ascites, no peritoneal signs, normal bowel sounds Extremities: no lower extremity edema bilaterally Skin: no lesions on visible extremities    Assessment and plan: 80 y.o. female with  recurrent dysphagia that has responded to esophageal dilations in the past  We'll try to get more complete records from her [place] gastroenterologist for review. He performed 3 EGDs over about 5 months time span, the records are received are very incomplete. She is a good historian however she tells me her esophagus was dilated and she had a very good response to that unfortunately she started having dysphasia issues again. I will also plan to proceed with repeat EGD and balloon dilation at her soonest convenience. I see no reason for any further blood tests or imaging studies.

## 2013-11-23 NOTE — Patient Instructions (Signed)
We will get records sent from your previous gastroenterologist for review Dr. Erich Montane at [facility] [place].  This will include any endoscopic (colonoscopy or upper endoscopy) procedures and any associated pathology reports.   You will be set up for an upper endoscopy for dysphagia. Stay on omeprazole once daily.

## 2013-12-08 ENCOUNTER — Other Ambulatory Visit: Payer: Self-pay | Admitting: *Deleted

## 2013-12-08 MED ORDER — SOTALOL HCL 80 MG PO TABS: 120.0000 mg | ORAL_TABLET | Freq: Two times a day (BID) | ORAL | Status: DC

## 2013-12-14 ENCOUNTER — Ambulatory Visit (INDEPENDENT_AMBULATORY_CARE_PROVIDER_SITE_OTHER): Payer: Medicare Other | Admitting: *Deleted

## 2013-12-14 DIAGNOSIS — I495 Sick sinus syndrome: Secondary | ICD-10-CM

## 2013-12-14 DIAGNOSIS — I4891 Unspecified atrial fibrillation: Secondary | ICD-10-CM

## 2013-12-14 NOTE — Progress Notes (Signed)
Remote pacemaker transmission.   

## 2013-12-16 DIAGNOSIS — M5430 Sciatica, unspecified side: Secondary | ICD-10-CM | POA: Diagnosis not present

## 2013-12-16 DIAGNOSIS — M545 Low back pain, unspecified: Secondary | ICD-10-CM | POA: Diagnosis not present

## 2013-12-16 DIAGNOSIS — M999 Biomechanical lesion, unspecified: Secondary | ICD-10-CM | POA: Diagnosis not present

## 2013-12-17 DIAGNOSIS — M47817 Spondylosis without myelopathy or radiculopathy, lumbosacral region: Secondary | ICD-10-CM | POA: Diagnosis not present

## 2013-12-17 DIAGNOSIS — M5137 Other intervertebral disc degeneration, lumbosacral region: Secondary | ICD-10-CM | POA: Diagnosis not present

## 2013-12-17 DIAGNOSIS — M545 Low back pain, unspecified: Secondary | ICD-10-CM | POA: Diagnosis not present

## 2013-12-20 ENCOUNTER — Other Ambulatory Visit (INDEPENDENT_AMBULATORY_CARE_PROVIDER_SITE_OTHER): Payer: Medicare Other

## 2013-12-20 ENCOUNTER — Ambulatory Visit (AMBULATORY_SURGERY_CENTER): Payer: Medicare Other | Admitting: Gastroenterology

## 2013-12-20 ENCOUNTER — Encounter: Payer: Self-pay | Admitting: Gastroenterology

## 2013-12-20 ENCOUNTER — Other Ambulatory Visit: Payer: Self-pay | Admitting: Gastroenterology

## 2013-12-20 VITALS — BP 149/72 | HR 62 | Temp 96.6°F | Resp 24 | Ht 66.0 in | Wt 174.0 lb

## 2013-12-20 DIAGNOSIS — R1314 Dysphagia, pharyngoesophageal phase: Secondary | ICD-10-CM | POA: Diagnosis not present

## 2013-12-20 DIAGNOSIS — R1319 Other dysphagia: Secondary | ICD-10-CM

## 2013-12-20 DIAGNOSIS — R1310 Dysphagia, unspecified: Secondary | ICD-10-CM | POA: Diagnosis not present

## 2013-12-20 DIAGNOSIS — R933 Abnormal findings on diagnostic imaging of other parts of digestive tract: Secondary | ICD-10-CM

## 2013-12-20 DIAGNOSIS — E039 Hypothyroidism, unspecified: Secondary | ICD-10-CM | POA: Diagnosis not present

## 2013-12-20 LAB — CREATININE, SERUM: Creatinine, Ser: 0.5 mg/dL (ref 0.4–1.2)

## 2013-12-20 LAB — BUN: BUN: 9 mg/dL (ref 6–23)

## 2013-12-20 MED ORDER — SODIUM CHLORIDE 0.9 % IV SOLN: 500.0000 mL | INTRAVENOUS | Status: DC

## 2013-12-20 NOTE — Patient Instructions (Signed)
Our office will schedule CT scan of chest, contrast given to you today and instruction sheet. Patty, Dr.Jacobs assistance will call you with appointment date.  Blood work today after discharge for the CT scan. Resume current medications today. Gastritis seen today, handout given.  Call us with any questions or concerns. Thank you!!  YOU HAD AN ENDOSCOPIC PROCEDURE TODAY AT [facility]: Refer to the procedure report that was given to you for any specific questions about what was found during the examination.  If the procedure report does not answer your questions, please call your gastroenterologist to clarify.  If you requested that your care partner not be given the details of your procedure findings, then the procedure report has been included in a sealed envelope for you to review at your convenience later.  YOU SHOULD EXPECT: Some feelings of bloating in the abdomen. Passage of more gas than usual.  Walking can help get rid of the air that was put into your GI tract during the procedure and reduce the bloating. If you had a lower endoscopy (such as a colonoscopy or flexible sigmoidoscopy) you may notice spotting of blood in your stool or on the toilet paper. If you underwent a bowel prep for your procedure, then you may not have a normal bowel movement for a few days.  DIET: Your first meal following the procedure should be a light meal and then it is ok to progress to your normal diet.  A half-sandwich or bowl of soup is an example of a good first meal.  Heavy or fried foods are harder to digest and may make you feel nauseous or bloated.  Likewise meals heavy in dairy and vegetables can cause extra gas to form and this can also increase the bloating.  Drink plenty of fluids but you should avoid alcoholic beverages for 24 hours.  ACTIVITY: Your care partner should take you home directly after the procedure.  You should plan to take it easy, moving slowly for the rest of the day.  You  can resume normal activity the day after the procedure however you should NOT DRIVE or use heavy machinery for 24 hours (because of the sedation medicines used during the test).    SYMPTOMS TO REPORT IMMEDIATELY: A gastroenterologist can be reached at any hour.  During normal business hours, 8:30 AM to 5:00 PM Monday through Friday, call [phone].  After hours and on weekends, please call the GI answering service at [phone] who will take a message and have the physician on call contact you.   Following lower endoscopy (colonoscopy or flexible sigmoidoscopy):  Excessive amounts of blood in the stool  Significant tenderness or worsening of abdominal pains  Swelling of the abdomen that is new, acute  Fever of 100F or higher  Following upper endoscopy (EGD)  Vomiting of blood or coffee ground material  New chest pain or pain under the shoulder blades  Painful or persistently difficult swallowing  New shortness of breath  Fever of 100F or higher  Black, tarry-looking stools  FOLLOW UP: If any biopsies were taken you will be contacted by phone or by letter within the next 1-3 weeks.  Call your gastroenterologist if you have not heard about the biopsies in 3 weeks.  Our staff will call the home number listed on your records the next business day following your procedure to check on you and address any questions or concerns that you may have at that time regarding the information  given to you following your procedure. This is a courtesy call and so if there is no answer at the home number and we have not heard from you through the emergency physician on call, we will assume that you have returned to your regular daily activities without incident.  SIGNATURES/CONFIDENTIALITY: You and/or your care partner have signed paperwork which will be entered into your electronic medical record.  These signatures attest to the fact that that the information above on your After Visit Summary has  been reviewed and is understood.  Full responsibility of the confidentiality of this discharge information lies with you and/or your care-partner.

## 2013-12-20 NOTE — Op Note (Signed)
[place]  [address] [place], [id number]   ENDOSCOPY PROCEDURE REPORT  PATIENT: Brittany [name], Brittany [name]  MR#: [id number] BIRTHDATE: [birth year] , 39  yrs. old GENDER: Female ENDOSCOPIST: Milus Banister, MD REFERRED BY:  Nicky Pugh, MD PROCEDURE DATE:  12/20/2013 PROCEDURE:  EGD, diagnostic ASA CLASS:     Class III INDICATIONS:  recurrent dysphagia; 2-3 EGDs in [place], endoscopy reports incomplete but patient clear she benefited from dilation. MEDICATIONS: propofol (Diprivan) 200mg  IV and MAC sedation, administered by CRNA TOPICAL ANESTHETIC: none  DESCRIPTION OF PROCEDURE: After the risks benefits and alternatives of the procedure were thoroughly explained, informed consent was obtained.  The LB [id number] endoscope was introduced through the mouth and advanced to the second portion of the duodenum. Without limitations.  The instrument was slowly withdrawn as the mucosa was fully examined.      In the mid esophagus (30cm from incisors) there was a signficant bulging inward of the mucosa over about 2-3cm.  The GE junction was normal appearing.  There was moderate, non-specific gastritis.  The examination was otherwise normal.  Retroflexed views revealed no abnormalities.     The scope was then withdrawn from the patient and the procedure completed. COMPLICATIONS: There were no complications.  ENDOSCOPIC IMPRESSION: In the mid esophagus there was a signficant bulging inward of the mucosa over about 2-3cm.  The GE junction was normal appearing. There was moderate, non-specific gastritis.  The examination was otherwise normal.  RECOMMENDATIONS: There is a narrowing in your esophagus that appears to be coming from outside the esophagus.  My office will arrange for chest CT scan (with IV contrast) to evaluated this area better.   eSigned:  Milus Banister, MD 12/20/2013 2:18 PM

## 2013-12-20 NOTE — Progress Notes (Signed)
Procedure ends, to recovery, report given and VSS. 

## 2013-12-21 ENCOUNTER — Telehealth: Payer: Self-pay

## 2013-12-21 DIAGNOSIS — K222 Esophageal obstruction: Secondary | ICD-10-CM

## 2013-12-21 NOTE — Telephone Encounter (Signed)
  Follow up Call-  Call back number 12/20/2013  Post procedure Call Back phone  # [phone]  Permission to leave phone message Yes     Patient questions:  Do you have a fever, pain , or abdominal swelling? no Pain Score  0 *  Have you tolerated food without any problems? yes  Have you been able to return to your normal activities? yes  Do you have any questions about your discharge instructions: Diet   no Medications  no Follow up visit  no  Do you have questions or concerns about your Care? no  Actions: * If pain score is 4 or above: No action needed, pain <4.  No problems per the pt. Maw

## 2013-12-21 NOTE — Telephone Encounter (Signed)
My office will arrange for chest CT scan (with IV contrast) to evaluated this area better.

## 2013-12-22 ENCOUNTER — Encounter: Payer: Self-pay | Admitting: Nurse Practitioner

## 2013-12-22 ENCOUNTER — Ambulatory Visit (INDEPENDENT_AMBULATORY_CARE_PROVIDER_SITE_OTHER): Payer: Medicare Other | Admitting: Nurse Practitioner

## 2013-12-22 VITALS — BP 185/82 | HR 66 | Temp 98.2°F | Resp 18 | Ht 66.0 in | Wt 174.0 lb

## 2013-12-22 DIAGNOSIS — E559 Vitamin D deficiency, unspecified: Secondary | ICD-10-CM

## 2013-12-22 DIAGNOSIS — F329 Major depressive disorder, single episode, unspecified: Secondary | ICD-10-CM

## 2013-12-22 DIAGNOSIS — Z13228 Encounter for screening for other metabolic disorders: Secondary | ICD-10-CM

## 2013-12-22 DIAGNOSIS — E039 Hypothyroidism, unspecified: Secondary | ICD-10-CM

## 2013-12-22 DIAGNOSIS — Z1321 Encounter for screening for nutritional disorder: Secondary | ICD-10-CM

## 2013-12-22 DIAGNOSIS — Z130 Encounter for screening for diseases of the blood and blood-forming organs and certain disorders involving the immune mechanism: Secondary | ICD-10-CM

## 2013-12-22 DIAGNOSIS — Z1329 Encounter for screening for other suspected endocrine disorder: Secondary | ICD-10-CM

## 2013-12-22 DIAGNOSIS — K219 Gastro-esophageal reflux disease without esophagitis: Secondary | ICD-10-CM

## 2013-12-22 DIAGNOSIS — R32 Unspecified urinary incontinence: Secondary | ICD-10-CM

## 2013-12-22 DIAGNOSIS — I10 Essential (primary) hypertension: Secondary | ICD-10-CM

## 2013-12-22 DIAGNOSIS — M79609 Pain in unspecified limb: Secondary | ICD-10-CM

## 2013-12-22 DIAGNOSIS — M79605 Pain in left leg: Secondary | ICD-10-CM

## 2013-12-22 DIAGNOSIS — F3289 Other specified depressive episodes: Secondary | ICD-10-CM

## 2013-12-22 DIAGNOSIS — M48061 Spinal stenosis, lumbar region without neurogenic claudication: Secondary | ICD-10-CM | POA: Insufficient documentation

## 2013-12-22 LAB — MICROALBUMIN / CREATININE URINE RATIO
Creatinine,U: 35.2 mg/dL
Microalb Creat Ratio: 3.1 mg/g (ref 0.0–30.0)
Microalb, Ur: 1.1 mg/dL (ref 0.0–1.9)

## 2013-12-22 LAB — MDC_IDC_ENUM_SESS_TYPE_REMOTE
Battery Impedance: 202 Ohm
Battery Remaining Longevity: 118 mo
Battery Voltage: 2.79 V
Brady Statistic AP VP Percent: 0 %
Brady Statistic AP VS Percent: 100 %
Brady Statistic AS VP Percent: 0 %
Brady Statistic AS VS Percent: 0 %
Date Time Interrogation Session: 20150526120143
Lead Channel Impedance Value: 469 Ohm
Lead Channel Impedance Value: 518 Ohm
Lead Channel Pacing Threshold Amplitude: 0.625 V
Lead Channel Pacing Threshold Amplitude: 0.875 V
Lead Channel Pacing Threshold Pulse Width: 0.4 ms
Lead Channel Pacing Threshold Pulse Width: 0.4 ms
Lead Channel Sensing Intrinsic Amplitude: 22.4 mV
Lead Channel Setting Pacing Amplitude: 1.75 V
Lead Channel Setting Pacing Amplitude: 2 V
Lead Channel Setting Pacing Pulse Width: 0.4 ms
Lead Channel Setting Sensing Sensitivity: 5.6 mV

## 2013-12-22 LAB — TSH: TSH: 1.93 u[IU]/mL (ref 0.35–4.50)

## 2013-12-22 LAB — VITAMIN D 25 HYDROXY (VIT D DEFICIENCY, FRACTURES): VITD: 42.09 ng/mL

## 2013-12-22 MED ORDER — BENAZEPRIL-HYDROCHLOROTHIAZIDE 20-12.5 MG PO TABS: 0.5000 | ORAL_TABLET | Freq: Every day | ORAL | Status: DC

## 2013-12-22 MED ORDER — CITALOPRAM HYDROBROMIDE 20 MG PO TABS: 20.0000 mg | ORAL_TABLET | Freq: Every day | ORAL | Status: DC

## 2013-12-22 MED ORDER — SOLIFENACIN SUCCINATE 10 MG PO TABS: 10.0000 mg | ORAL_TABLET | Freq: Every day | ORAL | Status: DC

## 2013-12-22 MED ORDER — LEVOTHYROXINE SODIUM 25 MCG PO TABS: 25.0000 ug | ORAL_TABLET | Freq: Every day | ORAL | Status: DC

## 2013-12-22 MED ORDER — BENAZEPRIL-HYDROCHLOROTHIAZIDE 20-12.5 MG PO TABS
1.0000 | ORAL_TABLET | Freq: Every day | ORAL | Status: DC
Start: 2013-12-22 — End: 2013-12-22

## 2013-12-22 MED ORDER — DESMOPRESSIN ACETATE 0.2 MG PO TABS: 0.2000 mg | ORAL_TABLET | Freq: Every day | ORAL | Status: DC

## 2013-12-22 MED ORDER — OMEPRAZOLE 20 MG PO CPDR: 20.0000 mg | DELAYED_RELEASE_CAPSULE | Freq: Every day | ORAL | Status: DC

## 2013-12-22 NOTE — Telephone Encounter (Signed)
Chest CT for 12/27/13 [place] 3 pm NPO 2 Pt aware

## 2013-12-22 NOTE — Assessment & Plan Note (Signed)
No s&s of hyper or hypothyroid. Current med 25 mcg levothyroxine. TSH today.

## 2013-12-22 NOTE — Patient Instructions (Signed)
You have 3 meds that can affect heart rhythm : sotalol, celexa, & vesicare. Wean off celexa: take 1T every other day for 1 week, then stop. Go to PT for leg pain. I think it is pulled muscle or ligament in the groin. If meloxicam is not helping leg pain, stop it as it can upset your stomach. You may use tylenol 1000 mg 3 times daily, heat several times daily.  No long walks. It may take several weeks to heal. If pain gets worse with PT or is not improving over next few weeks, we will get some images. I think blood pressure is elevated due to leg pain.   My office will call with lab results.

## 2013-12-22 NOTE — Assessment & Plan Note (Signed)
Would like for pt to stop vesicare as it can cause QT prolongation. She is not willing to stop medicine. Currently taking ddavp & vesicare. 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

## 2013-12-22 NOTE — Assessment & Plan Note (Addendum)
Not well controlled today, possibly due to leg pain. Current meds: lotensin hct 20/12.5 1/2T qd, amlodopine 5 mg, sotalol 120 mg BMET, BUN Creat nml Urine micro today F/u 3 weeks

## 2013-12-22 NOTE — Assessment & Plan Note (Addendum)
Celexa interacts w/sotalol & omprazole to cause prolonged QT. Discussed weaning off. Pt thinks she will feel fine: she states when she started med, she was having family situation that is now resolved. Pt instructed to take every other day for 1 week, then stop.  F/u 3 weeks.

## 2013-12-22 NOTE — Progress Notes (Signed)
Pre visit review using our clinic review tool, if applicable. No additional management support is needed unless otherwise documented below in the visit note. 

## 2013-12-22 NOTE — Progress Notes (Signed)
Subjective:     Brittany [name] is a 80 y.o. female presents for follow up of chronic conditions and new complaint of L anterior/lateral hip/upper thigh pain. She began to experience pain after brisk walking. She recalls having fallen a few days prior to onset of pain. Leg hurts with weight bearing & rates pain at 10/10. When at rest pain is 2/10. She mentioned pain to a provider at [facility], who recommended meloxicam and CT of L-s if no improvement. Low risk for stress fracture based on DEXA 6/14.  Chief Complaint: Hypothyroidism Brittany [name] is a 80 y.o. female who presents for follow up of hypothyroidism. Current symptoms: none . Patient denies change in energy level. Symptoms have been well-controlled. Additional Complaints: GERD She has been on omeprazole for many years with good control of heartburn. This has been associated with choking on food. She was evaluated by Dr Ardis Hughs who performed upper endo & found gastritis and narrowing of esophagus as if something pressing in from outside [place]. Chest CT is scheduled for further follow up. Urinary Incontinence Patient complains of incontinence. She has had symptoms for many years. She has been using DDAVP & vesicare for many years. 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   The following portions of the patient's history were reviewed and updated as appropriate: allergies, current medications, past medical history, past social history, past surgical history and problem list.  Review of Systems Pertinent items are noted in HPI.    Objective:    BP 104/67  Pulse 66  Temp(Src) 98.2 F (36.8 C) (Oral)  Resp 18  Ht 5\' 6"  (1.676 m)  Wt 174 lb (78.926 kg)  BMI 28.10 kg/m2  SpO2 99%  LMP 07/22/1968 BP 185/82  Pulse 66  Temp(Src) 98.2 F (36.8 C) (Oral)  Resp 18  Ht 5\' 6"  (1.676 m)  Wt 174 lb (78.926 kg)  BMI 28.10 kg/m2  SpO2 99%  LMP 07/22/1968 General appearance: alert, cooperative, appears stated age  and no distress Head: Normocephalic, without obvious abnormality, atraumatic Eyes: negative findings: lids and lashes normal and conjunctivae and sclerae normal Back: no tenderness to percussion or palpation Lungs: clear to auscultation bilaterally Heart: regular rate and rhythm, S1, S2 normal, no murmur, click, rub or gallop Extremities: no tenderness over L greater trocanter, FROM in L hip. Pain w/ posterior flexion of hip.    Assessment & Plan:  1. Unspecified hypothyroidism - TSH - levothyroxine (SYNTHROID, LEVOTHROID) 25 MCG tablet; Take 1 tablet (25 mcg total) by mouth daily before breakfast.  Dispense: 90 tablet; Refill: 2  2. Essential hypertension, benign - Urine Microalbumin w/creat. ratio - benazepril-hydrochlorthiazide (LOTENSIN HCT) 20-12.5 MG per tablet; Take 0.5 tablets by mouth daily.  Dispense: 90 tablet; Refill: 1  3. Unspecified vitamin D deficiency - Vit D  25 hydroxy (rtn osteoporosis monitoring)  4. Encounter for vitamin deficiency screening B12  5. GERD (gastroesophageal reflux disease) - omeprazole (PRILOSEC) 20 MG capsule; Take 1 capsule (20 mg total) by mouth daily.  Dispense: 90 capsule; Refill: 1 CT of chest pending-ordered by Dr Ardis Hughs  6. Depressive disorder, not elsewhere classified Wean off celexa due to potential for prolongation of QT  7. Urinary incontinence - desmopressin (DDAVP) 0.2 MG tablet; Take 1 tablet (0.2 mg total) by mouth daily.  Dispense: 90 tablet; Refill: 1 - solifenacin (VESICARE) 10 MG tablet; Take 1 tablet (10 mg total) by mouth daily.  Dispense: 90 tablet; Refill: 0  8. Left leg pain Tylenol,  heat - Ambulatory referral to Physical Therapy F/u 3 weeks See problem list for complete A&P

## 2013-12-22 NOTE — Assessment & Plan Note (Signed)
New onset. Had fall few days before pain started. Pain stared after vigorous walking. Per exam, appears she may have tenosynovitis or pulled muscle at anterior superior iliac spine-perhaps pulled sartarius tensor fasciae lat, or rectus femoris, possibly iliopsoas.  Meloxicam not helping pain. Tyelenol. Heat. PT referal.  Will consider imaging if no improvement.

## 2013-12-23 ENCOUNTER — Telehealth: Payer: Self-pay | Admitting: Nurse Practitioner

## 2013-12-23 ENCOUNTER — Ambulatory Visit: Payer: Medicare Other | Admitting: Nurse Practitioner

## 2013-12-23 NOTE — Telephone Encounter (Signed)
LMOM to CB. 

## 2013-12-23 NOTE — Telephone Encounter (Signed)
Relevant patient education assigned to patient using Emmi. ° °

## 2013-12-23 NOTE — Telephone Encounter (Signed)
Spoke with pt advised lab results. Pt understood.

## 2013-12-23 NOTE — Telephone Encounter (Signed)
pls call pt: Advise All labs are good. Check again in 1 year.

## 2013-12-24 ENCOUNTER — Telehealth: Payer: Self-pay | Admitting: Nurse Practitioner

## 2013-12-24 MED ORDER — AMLODIPINE BESYLATE 5 MG PO TABS: 5.0000 mg | ORAL_TABLET | Freq: Every day | ORAL | Status: DC

## 2013-12-24 NOTE — Telephone Encounter (Signed)
Rx sent to pharmacy   

## 2013-12-27 ENCOUNTER — Ambulatory Visit (INDEPENDENT_AMBULATORY_CARE_PROVIDER_SITE_OTHER)
Admission: RE | Admit: 2013-12-27 | Discharge: 2013-12-27 | Disposition: A | Discharge status: Home / Self Care | Payer: Medicare Other | Source: Ambulatory Visit | Attending: Gastroenterology | Admitting: Gastroenterology

## 2013-12-27 DIAGNOSIS — M6281 Muscle weakness (generalized): Secondary | ICD-10-CM | POA: Diagnosis not present

## 2013-12-27 DIAGNOSIS — K222 Esophageal obstruction: Secondary | ICD-10-CM

## 2013-12-27 DIAGNOSIS — R262 Difficulty in walking, not elsewhere classified: Secondary | ICD-10-CM | POA: Diagnosis not present

## 2013-12-27 DIAGNOSIS — J984 Other disorders of lung: Secondary | ICD-10-CM | POA: Diagnosis not present

## 2013-12-27 DIAGNOSIS — M25569 Pain in unspecified knee: Secondary | ICD-10-CM | POA: Diagnosis not present

## 2013-12-27 MED ORDER — IOHEXOL 300 MG/ML  SOLN: 80.0000 mL | Freq: Once | INTRAMUSCULAR | Status: AC | PRN

## 2013-12-28 DIAGNOSIS — R262 Difficulty in walking, not elsewhere classified: Secondary | ICD-10-CM | POA: Diagnosis not present

## 2013-12-28 DIAGNOSIS — M6281 Muscle weakness (generalized): Secondary | ICD-10-CM | POA: Diagnosis not present

## 2013-12-28 DIAGNOSIS — M25569 Pain in unspecified knee: Secondary | ICD-10-CM | POA: Diagnosis not present

## 2013-12-29 ENCOUNTER — Telehealth: Payer: Self-pay | Admitting: Internal Medicine

## 2013-12-29 ENCOUNTER — Encounter: Payer: Self-pay | Admitting: Internal Medicine

## 2013-12-29 ENCOUNTER — Institutional Professional Consult (permissible substitution): Payer: Medicare Other | Admitting: Internal Medicine

## 2013-12-29 ENCOUNTER — Other Ambulatory Visit: Payer: Self-pay

## 2013-12-29 ENCOUNTER — Ambulatory Visit (INDEPENDENT_AMBULATORY_CARE_PROVIDER_SITE_OTHER)
Admission: RE | Admit: 2013-12-29 | Discharge: 2013-12-29 | Disposition: A | Discharge status: Home / Self Care | Payer: Medicare Other | Source: Ambulatory Visit | Attending: Internal Medicine | Admitting: Internal Medicine

## 2013-12-29 ENCOUNTER — Ambulatory Visit (INDEPENDENT_AMBULATORY_CARE_PROVIDER_SITE_OTHER): Payer: Medicare Other | Admitting: Internal Medicine

## 2013-12-29 VITALS — BP 124/80 | HR 61 | Temp 98.3°F | Ht 68.0 in | Wt 174.0 lb

## 2013-12-29 DIAGNOSIS — I10 Essential (primary) hypertension: Secondary | ICD-10-CM

## 2013-12-29 DIAGNOSIS — R918 Other nonspecific abnormal finding of lung field: Secondary | ICD-10-CM | POA: Diagnosis not present

## 2013-12-29 DIAGNOSIS — K219 Gastro-esophageal reflux disease without esophagitis: Secondary | ICD-10-CM | POA: Diagnosis not present

## 2013-12-29 DIAGNOSIS — R911 Solitary pulmonary nodule: Secondary | ICD-10-CM

## 2013-12-29 DIAGNOSIS — J984 Other disorders of lung: Secondary | ICD-10-CM | POA: Diagnosis not present

## 2013-12-29 NOTE — Progress Notes (Signed)
Quick Note:  LMTCB ______ 

## 2013-12-29 NOTE — Telephone Encounter (Signed)
Called and spoke with pts husband and he is aware of cxr results per MW.  He is aware of this and will pass this along to the pt and pt can call back for any questions or concerns.

## 2013-12-29 NOTE — Patient Instructions (Addendum)
Please remember to go to the  x-ray department downstairs for your tests - we will call you with the results when they are available.  If you start having more problems swallowing or with your throat of any kind you will need a trial off lotensin (ACE inhibitors = benazaril)   We will place you in recall file for CT chest in one year for comparison purposes - call sooner if new cough or breathing problems develop

## 2013-12-29 NOTE — Progress Notes (Signed)
Subjective:    Patient ID: Brittany [name], female    DOB: 1934-05-14   MRN: [id number]  HPI  79 yowf quit smoking 1992 no resp symptoms referred by Dr Ardis Hughs to pulmonary clinic 12/29/2013 for eval of abn CT c/w mpns after told her last cxr was nl in [place] around one year prior to first pulmonary ov.   12/29/2013 1st [facility] office visit/ Brittany [name]  Chief Complaint  Patient presents with  . Pulmonary Consult    Referred per Dr. Owens Loffler for eval of abnormal CT Chest.    new dysphagia onset x 09/2013 while on ACEi rx by GI with EGD 12/20/13 with ?  extrinsic compression with dysphagia relieved with ppi and ct chest 12/27/13 >>neg for med abn but mpns so referred for that.  Pt had nl cxr per report in 2014 in  [place] = wnl  No h/o ca/ RA  No obvious   assoc chronic cough or sob or cp or chest tightness, subjective wheeze overt sinus or hb symptoms. No unusual exp hx or h/o childhood pna/ asthma or knowledge of premature birth.  Sleeping ok without nocturnal  or early am exacerbation  of respiratory  c/o's or need for noct saba. Also denies any obvious fluctuation of symptoms with weather or environmental changes or other aggravating or alleviating factors except as outlined above   Current Medications, Allergies, Complete Past Medical History, Past Surgical History, Family History, and Social History were reviewed in [employer] record.           Review of Systems  Constitutional: Negative for fever, chills and unexpected weight change.  HENT: Negative for congestion, dental problem, ear pain, nosebleeds, postnasal drip, rhinorrhea, sinus pressure, sneezing, sore throat, trouble swallowing and voice change.   Eyes: Negative for visual disturbance.  Respiratory: Negative for cough, choking and shortness of breath.   Cardiovascular: Negative for chest pain and leg swelling.  Gastrointestinal: Negative for vomiting, abdominal pain and diarrhea.    Genitourinary: Negative for difficulty urinating.  Musculoskeletal: Negative for arthralgias.  Skin: Negative for rash.  Neurological: Negative for tremors, syncope and headaches.  Hematological: Does not bruise/bleed easily.       Objective:   Physical Exam  Wt Readings from Last 3 Encounters:  12/29/13 174 lb (78.926 kg)  12/22/13 174 lb (78.926 kg)  12/20/13 174 lb (78.926 kg)      HEENT: nl dentition, turbinates, and orophanx. Nl external ear canals without cough reflex   NECK :  without JVD/Nodes/TM/ nl carotid upstrokes bilaterally   LUNGS: no acc muscle use, clear to A and P bilaterally without cough on insp or exp maneuvers   CV:  RRR  no s3 or murmur or increase in P2, no edema   ABD:  soft and nontender with nl excursion in the supine position. No bruits or organomegaly, bowel sounds nl  MS:  warm without deformities, calf tenderness, cyanosis or clubbing  SKIN: warm and dry without lesions    NEURO:  alert, approp, no deficits    CT chest 12/27/13 1. The esophagus is grossly unremarkable in appearance on today's  examination. Specifically, no definite esophageal mass is noted.  2. Multiple pulmonary nodules, as above. The cluster of nodules in  the lateral segment of the right middle lobe presumably reflects  areas of mucoid impaction. The other nodule in the inferior aspect  of the right upper lobe is nonspecific and measures only 5 mm. If  the  patient is at high risk for bronchogenic carcinoma, follow-up  chest CT at 6-12 months is recommended.     CXR  12/29/2013 :   Dual lead pacemaker in good position. Negative for heart failure small left pleural effusion noted.  Right middle lobe nodules and right upper lobe nodule not seen on the current study due to small size. If followup of these is desired, chest CT would be necessary.  Negative for pneumonia. No mass or adenopathy.      Assessment & Plan:

## 2013-12-30 ENCOUNTER — Telehealth: Payer: Self-pay | Admitting: Nurse Practitioner

## 2013-12-30 DIAGNOSIS — M6281 Muscle weakness (generalized): Secondary | ICD-10-CM | POA: Diagnosis not present

## 2013-12-30 DIAGNOSIS — M25569 Pain in unspecified knee: Secondary | ICD-10-CM | POA: Diagnosis not present

## 2013-12-30 DIAGNOSIS — R262 Difficulty in walking, not elsewhere classified: Secondary | ICD-10-CM | POA: Diagnosis not present

## 2013-12-30 DIAGNOSIS — R918 Other nonspecific abnormal finding of lung field: Secondary | ICD-10-CM | POA: Insufficient documentation

## 2013-12-30 NOTE — Assessment & Plan Note (Signed)
-   see CT chest 12/27/13 with nl cxr 12/29/13 so old cxr's will do [place] no good.   Her risk of ca is low but not 0 so Discussed in detail all the  indications, usual  risks and alternatives  relative to the benefits with patient who agrees to proceed with conservative f/u with CT chest in one year, placed in tickle file for recall

## 2013-12-30 NOTE — Telephone Encounter (Signed)
Pt c/o same leg pain. No change in treatment. Continue w/PT. [facility] f/[facility] w/[facility]. Let them know if pain gets worse. They started mobic.

## 2013-12-30 NOTE — Assessment & Plan Note (Signed)
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

## 2013-12-30 NOTE — Assessment & Plan Note (Signed)
ACE inhibitors are problematic in  pts with upper airway complaints because  even experienced pulmonologists can't always distinguish ace effects from copd/asthma/pnds/ allergies etc.  By themselves they don't actually cause a problem, much like oxygen can't by itself start a fire, but they certainly serve as a powerful catalyst or enhancer for any "fire"  or inflammatory process in the upper airway, be it caused by an ET  tube or more commonly reflux (especially in the obese or pts with known GERD or who are on biphoshonates) or URI's, due to interference with bradykinin clearance.  The effects of acei on bradykinin levels occurs in 100% of pt's on acei (unless they surreptitiously stop the med!) but the classic cough is only reported in 5%.  This leaves 95% of pts on acei's  with a variety of syndromes including no identifiable symptom in most  vs non-specific symptoms that wax and wane depending on what other insult is occuring at the level of the upper airway like LPR in her case  For now ok to continue since it appears she's better back on PPI despite neg EGD findings to suggest much in the way of chronic gerd or stricture related to GERD

## 2014-01-03 DIAGNOSIS — M25569 Pain in unspecified knee: Secondary | ICD-10-CM | POA: Diagnosis not present

## 2014-01-03 DIAGNOSIS — M6281 Muscle weakness (generalized): Secondary | ICD-10-CM | POA: Diagnosis not present

## 2014-01-03 DIAGNOSIS — R262 Difficulty in walking, not elsewhere classified: Secondary | ICD-10-CM | POA: Diagnosis not present

## 2014-01-05 DIAGNOSIS — M25569 Pain in unspecified knee: Secondary | ICD-10-CM | POA: Diagnosis not present

## 2014-01-05 DIAGNOSIS — R262 Difficulty in walking, not elsewhere classified: Secondary | ICD-10-CM | POA: Diagnosis not present

## 2014-01-05 DIAGNOSIS — M6281 Muscle weakness (generalized): Secondary | ICD-10-CM | POA: Diagnosis not present

## 2014-01-06 ENCOUNTER — Other Ambulatory Visit: Payer: Self-pay | Admitting: Rehabilitation

## 2014-01-06 DIAGNOSIS — M5442 Lumbago with sciatica, left side: Secondary | ICD-10-CM

## 2014-01-07 ENCOUNTER — Ambulatory Visit: Payer: Medicare Other | Admitting: Nurse Practitioner

## 2014-01-07 DIAGNOSIS — M6281 Muscle weakness (generalized): Secondary | ICD-10-CM | POA: Diagnosis not present

## 2014-01-07 DIAGNOSIS — M25569 Pain in unspecified knee: Secondary | ICD-10-CM | POA: Diagnosis not present

## 2014-01-07 DIAGNOSIS — R262 Difficulty in walking, not elsewhere classified: Secondary | ICD-10-CM | POA: Diagnosis not present

## 2014-01-10 ENCOUNTER — Ambulatory Visit
Admission: RE | Admit: 2014-01-10 | Discharge: 2014-01-10 | Disposition: A | Discharge status: Home / Self Care | Payer: Medicare Other | Source: Ambulatory Visit | Attending: Rehabilitation | Admitting: Rehabilitation

## 2014-01-10 VITALS — BP 120/66 | HR 84

## 2014-01-10 DIAGNOSIS — M47817 Spondylosis without myelopathy or radiculopathy, lumbosacral region: Secondary | ICD-10-CM | POA: Diagnosis not present

## 2014-01-10 DIAGNOSIS — M5442 Lumbago with sciatica, left side: Secondary | ICD-10-CM

## 2014-01-10 DIAGNOSIS — M48061 Spinal stenosis, lumbar region without neurogenic claudication: Secondary | ICD-10-CM | POA: Diagnosis not present

## 2014-01-10 MED ORDER — DIAZEPAM 5 MG PO TABS
5.0000 mg | ORAL_TABLET | Freq: Once | ORAL | Status: AC
  Administered 2014-01-10: 5 mg via ORAL

## 2014-01-10 MED ORDER — ONDANSETRON HCL 4 MG/2ML IJ SOLN
4.0000 mg | Freq: Once | INTRAMUSCULAR | Status: AC
  Administered 2014-01-10: 4 mg via INTRAMUSCULAR

## 2014-01-10 MED ORDER — MEPERIDINE HCL 100 MG/ML IJ SOLN
75.0000 mg | Freq: Once | INTRAMUSCULAR | Status: AC
  Administered 2014-01-10: 75 mg via INTRAMUSCULAR

## 2014-01-10 MED ORDER — IOHEXOL 180 MG/ML  SOLN
20.0000 mL | Freq: Once | INTRAMUSCULAR | Status: AC | PRN
  Administered 2014-01-10: 20 mL via INTRATHECAL

## 2014-01-10 NOTE — Discharge Instructions (Signed)

## 2014-01-10 NOTE — Progress Notes (Signed)
Husband at bedside in nursing station after patient completed myelogram.  Brita Romp, RN

## 2014-01-12 DIAGNOSIS — M545 Low back pain, unspecified: Secondary | ICD-10-CM | POA: Diagnosis not present

## 2014-01-12 DIAGNOSIS — M47817 Spondylosis without myelopathy or radiculopathy, lumbosacral region: Secondary | ICD-10-CM | POA: Diagnosis not present

## 2014-01-12 DIAGNOSIS — M48061 Spinal stenosis, lumbar region without neurogenic claudication: Secondary | ICD-10-CM | POA: Diagnosis not present

## 2014-01-12 DIAGNOSIS — IMO0002 Reserved for concepts with insufficient information to code with codable children: Secondary | ICD-10-CM | POA: Diagnosis not present

## 2014-01-12 DIAGNOSIS — M5137 Other intervertebral disc degeneration, lumbosacral region: Secondary | ICD-10-CM | POA: Diagnosis not present

## 2014-01-13 DIAGNOSIS — R262 Difficulty in walking, not elsewhere classified: Secondary | ICD-10-CM | POA: Diagnosis not present

## 2014-01-13 DIAGNOSIS — M25569 Pain in unspecified knee: Secondary | ICD-10-CM | POA: Diagnosis not present

## 2014-01-13 DIAGNOSIS — M6281 Muscle weakness (generalized): Secondary | ICD-10-CM | POA: Diagnosis not present

## 2014-01-14 ENCOUNTER — Telehealth: Payer: Self-pay | Admitting: Internal Medicine

## 2014-01-14 DIAGNOSIS — M25569 Pain in unspecified knee: Secondary | ICD-10-CM | POA: Diagnosis not present

## 2014-01-14 DIAGNOSIS — R262 Difficulty in walking, not elsewhere classified: Secondary | ICD-10-CM | POA: Diagnosis not present

## 2014-01-14 DIAGNOSIS — M6281 Muscle weakness (generalized): Secondary | ICD-10-CM | POA: Diagnosis not present

## 2014-01-14 NOTE — Telephone Encounter (Signed)
Pt uses over the counter transcutaneous nerve stimulator for back pain/waist/hips. Pt is atrial dependent. Pt states she uses the machine at/above her waist line. I explained that depending on the intensity of the delivered impulses from her machine, her pacemaker could misinterpret the signals as heartbeats. If so, intended pacing would be inhibited and potentially cause syncope. I said I am okay if she uses the stimulator on her legs but nowhere on her torso. Pt expressed understanding.

## 2014-01-14 NOTE — Telephone Encounter (Signed)
Patients hips is hurting and she has a pacemaker. She wants to know if its okay for her to use a tens unit? Please call and advise.

## 2014-01-17 ENCOUNTER — Encounter: Payer: Self-pay | Admitting: Nurse Practitioner

## 2014-01-18 ENCOUNTER — Encounter: Payer: Self-pay | Admitting: Cardiology

## 2014-01-19 ENCOUNTER — Encounter: Payer: Self-pay | Admitting: Internal Medicine

## 2014-01-24 DIAGNOSIS — M25569 Pain in unspecified knee: Secondary | ICD-10-CM | POA: Diagnosis not present

## 2014-01-24 DIAGNOSIS — M6281 Muscle weakness (generalized): Secondary | ICD-10-CM | POA: Diagnosis not present

## 2014-01-24 DIAGNOSIS — R262 Difficulty in walking, not elsewhere classified: Secondary | ICD-10-CM | POA: Diagnosis not present

## 2014-01-26 DIAGNOSIS — M545 Low back pain, unspecified: Secondary | ICD-10-CM | POA: Diagnosis not present

## 2014-01-26 DIAGNOSIS — M6281 Muscle weakness (generalized): Secondary | ICD-10-CM | POA: Diagnosis not present

## 2014-01-26 DIAGNOSIS — M999 Biomechanical lesion, unspecified: Secondary | ICD-10-CM | POA: Diagnosis not present

## 2014-01-26 DIAGNOSIS — R262 Difficulty in walking, not elsewhere classified: Secondary | ICD-10-CM | POA: Diagnosis not present

## 2014-01-26 DIAGNOSIS — M25569 Pain in unspecified knee: Secondary | ICD-10-CM | POA: Diagnosis not present

## 2014-01-26 DIAGNOSIS — M48061 Spinal stenosis, lumbar region without neurogenic claudication: Secondary | ICD-10-CM | POA: Diagnosis not present

## 2014-01-28 DIAGNOSIS — M6281 Muscle weakness (generalized): Secondary | ICD-10-CM | POA: Diagnosis not present

## 2014-01-28 DIAGNOSIS — M25569 Pain in unspecified knee: Secondary | ICD-10-CM | POA: Diagnosis not present

## 2014-01-28 DIAGNOSIS — R262 Difficulty in walking, not elsewhere classified: Secondary | ICD-10-CM | POA: Diagnosis not present

## 2014-01-28 DIAGNOSIS — M999 Biomechanical lesion, unspecified: Secondary | ICD-10-CM | POA: Diagnosis not present

## 2014-01-28 DIAGNOSIS — M545 Low back pain, unspecified: Secondary | ICD-10-CM | POA: Diagnosis not present

## 2014-01-28 DIAGNOSIS — M48061 Spinal stenosis, lumbar region without neurogenic claudication: Secondary | ICD-10-CM | POA: Diagnosis not present

## 2014-01-31 DIAGNOSIS — M25569 Pain in unspecified knee: Secondary | ICD-10-CM | POA: Diagnosis not present

## 2014-01-31 DIAGNOSIS — M6281 Muscle weakness (generalized): Secondary | ICD-10-CM | POA: Diagnosis not present

## 2014-01-31 DIAGNOSIS — R262 Difficulty in walking, not elsewhere classified: Secondary | ICD-10-CM | POA: Diagnosis not present

## 2014-01-31 DIAGNOSIS — IMO0002 Reserved for concepts with insufficient information to code with codable children: Secondary | ICD-10-CM | POA: Diagnosis not present

## 2014-01-31 DIAGNOSIS — M48061 Spinal stenosis, lumbar region without neurogenic claudication: Secondary | ICD-10-CM | POA: Diagnosis not present

## 2014-01-31 DIAGNOSIS — M47817 Spondylosis without myelopathy or radiculopathy, lumbosacral region: Secondary | ICD-10-CM | POA: Diagnosis not present

## 2014-02-01 ENCOUNTER — Encounter: Payer: Self-pay | Admitting: Nurse Practitioner

## 2014-02-09 ENCOUNTER — Encounter: Payer: Self-pay | Admitting: Nurse Practitioner

## 2014-02-09 ENCOUNTER — Ambulatory Visit (INDEPENDENT_AMBULATORY_CARE_PROVIDER_SITE_OTHER): Payer: Medicare Other | Admitting: Nurse Practitioner

## 2014-02-09 VITALS — BP 174/82 | HR 64 | Temp 97.8°F | Ht 66.0 in | Wt 174.0 lb

## 2014-02-09 DIAGNOSIS — G4733 Obstructive sleep apnea (adult) (pediatric): Secondary | ICD-10-CM

## 2014-02-09 DIAGNOSIS — Z9989 Dependence on other enabling machines and devices: Secondary | ICD-10-CM

## 2014-02-09 NOTE — Progress Notes (Signed)
Pre visit review using our clinic review tool, if applicable. No additional management support is needed unless otherwise documented below in the visit note. 

## 2014-02-09 NOTE — Assessment & Plan Note (Signed)
Using CPAP nightly from 9:30pm to 6am.  Pt states CPAP prevents her from snoring. She feels rested when wakes. Current settings: 8 cm H2O Sleep study dated 08/13/2012 shows improvement with CPAP including fewer arousals & decreased periodic limb movements.

## 2014-02-09 NOTE — Progress Notes (Signed)
Subjective:     Brittany [name] is a 80 y.o. female who presents for evaluation of CPAP for OSA with periodic limb movement. She had sleep study eval in [place] January 2014. Records reviewed: Moderate OSA with AHI of 24.0/hr, REM AHI 61.7/hr, supine AHI 67.4/hr, LSAT 72%; Optimal CPAP titration of 8 cm H2O; PLMI 55.8/hr & PLMS Arousal Index 4.1/hr. Pt continues to use CPAP nightly for about 9 hours. She feels rested upon waking, states machine prevents snoring.   Previous Report(s) Reviewed: Somnogram report from [facility] in [place], [place].   The following portions of the patient's history were reviewed and updated as appropriate: allergies, current medications, past family history, past medical history, past social history, past surgical history and problem list.  Review of Systems Pertinent items are noted in HPI.    Objective:    BP 174/82  Pulse 64  Temp(Src) 97.8 F (36.6 C) (Temporal)  Ht 5\' 6"  (1.676 m)  Wt 174 lb (78.926 kg)  BMI 28.10 kg/m2  SpO2 99%  LMP 07/22/1968 General appearance: alert, cooperative, appears stated age and no distress Head: Normocephalic, without obvious abnormality, atraumatic Eyes: negative findings: lids and lashes normal and conjunctivae and sclerae normal    Assessment:    obstructive sleep apnea     Plan:     Continue Cpap nightly at current setting: 8 cm H2O.

## 2014-02-11 DIAGNOSIS — M545 Low back pain, unspecified: Secondary | ICD-10-CM | POA: Diagnosis not present

## 2014-02-11 DIAGNOSIS — M48061 Spinal stenosis, lumbar region without neurogenic claudication: Secondary | ICD-10-CM | POA: Diagnosis not present

## 2014-02-11 DIAGNOSIS — IMO0002 Reserved for concepts with insufficient information to code with codable children: Secondary | ICD-10-CM | POA: Diagnosis not present

## 2014-02-15 DIAGNOSIS — M48062 Spinal stenosis, lumbar region with neurogenic claudication: Secondary | ICD-10-CM | POA: Diagnosis not present

## 2014-02-15 DIAGNOSIS — M5430 Sciatica, unspecified side: Secondary | ICD-10-CM | POA: Diagnosis not present

## 2014-02-15 DIAGNOSIS — M4716 Other spondylosis with myelopathy, lumbar region: Secondary | ICD-10-CM | POA: Diagnosis not present

## 2014-02-18 ENCOUNTER — Encounter: Payer: Self-pay | Admitting: Nurse Practitioner

## 2014-02-19 HISTORY — PX: LUMBAR DISC SURGERY: SHX700

## 2014-02-21 DIAGNOSIS — N3946 Mixed incontinence: Secondary | ICD-10-CM | POA: Diagnosis not present

## 2014-02-21 DIAGNOSIS — R1310 Dysphagia, unspecified: Secondary | ICD-10-CM | POA: Diagnosis not present

## 2014-02-21 DIAGNOSIS — I4891 Unspecified atrial fibrillation: Secondary | ICD-10-CM | POA: Diagnosis not present

## 2014-02-21 DIAGNOSIS — R269 Unspecified abnormalities of gait and mobility: Secondary | ICD-10-CM | POA: Diagnosis not present

## 2014-02-21 DIAGNOSIS — Z950 Presence of cardiac pacemaker: Secondary | ICD-10-CM | POA: Diagnosis not present

## 2014-02-21 DIAGNOSIS — H3530 Unspecified macular degeneration: Secondary | ICD-10-CM | POA: Diagnosis not present

## 2014-02-21 DIAGNOSIS — M4716 Other spondylosis with myelopathy, lumbar region: Secondary | ICD-10-CM | POA: Diagnosis not present

## 2014-02-21 DIAGNOSIS — M545 Low back pain, unspecified: Secondary | ICD-10-CM | POA: Diagnosis not present

## 2014-02-21 DIAGNOSIS — M5430 Sciatica, unspecified side: Secondary | ICD-10-CM | POA: Diagnosis not present

## 2014-02-21 DIAGNOSIS — K219 Gastro-esophageal reflux disease without esophagitis: Secondary | ICD-10-CM | POA: Diagnosis not present

## 2014-02-21 DIAGNOSIS — M48062 Spinal stenosis, lumbar region with neurogenic claudication: Secondary | ICD-10-CM | POA: Diagnosis not present

## 2014-02-21 DIAGNOSIS — G4733 Obstructive sleep apnea (adult) (pediatric): Secondary | ICD-10-CM | POA: Diagnosis not present

## 2014-02-21 DIAGNOSIS — I10 Essential (primary) hypertension: Secondary | ICD-10-CM | POA: Diagnosis not present

## 2014-02-23 DIAGNOSIS — R269 Unspecified abnormalities of gait and mobility: Secondary | ICD-10-CM | POA: Diagnosis present

## 2014-02-23 DIAGNOSIS — H3530 Unspecified macular degeneration: Secondary | ICD-10-CM | POA: Diagnosis present

## 2014-02-23 DIAGNOSIS — I4891 Unspecified atrial fibrillation: Secondary | ICD-10-CM | POA: Diagnosis present

## 2014-02-23 DIAGNOSIS — M5430 Sciatica, unspecified side: Secondary | ICD-10-CM | POA: Diagnosis not present

## 2014-02-23 DIAGNOSIS — N3946 Mixed incontinence: Secondary | ICD-10-CM | POA: Diagnosis present

## 2014-02-23 DIAGNOSIS — M545 Low back pain, unspecified: Secondary | ICD-10-CM | POA: Diagnosis present

## 2014-02-23 DIAGNOSIS — I10 Essential (primary) hypertension: Secondary | ICD-10-CM | POA: Diagnosis present

## 2014-02-23 DIAGNOSIS — M4716 Other spondylosis with myelopathy, lumbar region: Secondary | ICD-10-CM | POA: Diagnosis not present

## 2014-02-23 DIAGNOSIS — G4733 Obstructive sleep apnea (adult) (pediatric): Secondary | ICD-10-CM | POA: Diagnosis present

## 2014-02-23 DIAGNOSIS — R1310 Dysphagia, unspecified: Secondary | ICD-10-CM | POA: Diagnosis present

## 2014-02-23 DIAGNOSIS — M48062 Spinal stenosis, lumbar region with neurogenic claudication: Secondary | ICD-10-CM | POA: Diagnosis not present

## 2014-02-23 DIAGNOSIS — Z950 Presence of cardiac pacemaker: Secondary | ICD-10-CM | POA: Diagnosis not present

## 2014-02-23 DIAGNOSIS — Z4789 Encounter for other orthopedic aftercare: Secondary | ICD-10-CM | POA: Diagnosis not present

## 2014-02-23 DIAGNOSIS — K219 Gastro-esophageal reflux disease without esophagitis: Secondary | ICD-10-CM | POA: Diagnosis present

## 2014-03-10 ENCOUNTER — Encounter: Payer: Self-pay | Admitting: Internal Medicine

## 2014-03-10 ENCOUNTER — Ambulatory Visit (INDEPENDENT_AMBULATORY_CARE_PROVIDER_SITE_OTHER): Payer: Medicare Other | Admitting: Internal Medicine

## 2014-03-10 ENCOUNTER — Other Ambulatory Visit: Payer: Self-pay | Admitting: Nurse Practitioner

## 2014-03-10 VITALS — BP 144/70 | HR 65 | Ht 68.0 in | Wt 174.0 lb

## 2014-03-10 DIAGNOSIS — G4733 Obstructive sleep apnea (adult) (pediatric): Secondary | ICD-10-CM

## 2014-03-10 DIAGNOSIS — Z950 Presence of cardiac pacemaker: Secondary | ICD-10-CM | POA: Diagnosis not present

## 2014-03-10 DIAGNOSIS — I480 Paroxysmal atrial fibrillation: Secondary | ICD-10-CM

## 2014-03-10 DIAGNOSIS — I495 Sick sinus syndrome: Secondary | ICD-10-CM

## 2014-03-10 DIAGNOSIS — I4891 Unspecified atrial fibrillation: Secondary | ICD-10-CM | POA: Diagnosis not present

## 2014-03-10 DIAGNOSIS — Z9989 Dependence on other enabling machines and devices: Secondary | ICD-10-CM

## 2014-03-10 LAB — MDC_IDC_ENUM_SESS_TYPE_INCLINIC
Battery Impedance: 227 Ohm
Battery Remaining Longevity: 116 mo
Battery Voltage: 2.79 V
Brady Statistic AP VP Percent: 0 %
Brady Statistic AP VS Percent: 99 %
Brady Statistic AS VP Percent: 0 %
Brady Statistic AS VS Percent: 0 %
Date Time Interrogation Session: 20150820114247
Lead Channel Impedance Value: 455 Ohm
Lead Channel Impedance Value: 496 Ohm
Lead Channel Pacing Threshold Amplitude: 0.5 V
Lead Channel Pacing Threshold Amplitude: 0.75 V
Lead Channel Pacing Threshold Pulse Width: 0.4 ms
Lead Channel Pacing Threshold Pulse Width: 0.4 ms
Lead Channel Sensing Intrinsic Amplitude: 15.67 mV
Lead Channel Sensing Intrinsic Amplitude: 2.8 mV
Lead Channel Setting Pacing Amplitude: 2 V
Lead Channel Setting Pacing Amplitude: 2.5 V
Lead Channel Setting Pacing Pulse Width: 0.4 ms
Lead Channel Setting Sensing Sensitivity: 5.6 mV

## 2014-03-10 LAB — BASIC METABOLIC PANEL
BUN: 10 mg/dL (ref 6–23)
CO2: 30 mEq/L (ref 19–32)
Calcium: 10.7 mg/dL — ABNORMAL HIGH (ref 8.4–10.5)
Chloride: 98 mEq/L (ref 96–112)
Creatinine, Ser: 0.6 mg/dL (ref 0.4–1.2)
GFR: 111.32 mL/min (ref >60.00)
Glucose, Bld: 82 mg/dL (ref 70–99)
Potassium: 4.2 mEq/L (ref 3.5–5.1)
Sodium: 135 mEq/L (ref 135–145)

## 2014-03-10 LAB — MAGNESIUM: Magnesium: 1.7 mg/dL (ref 1.5–2.5)

## 2014-03-10 MED ORDER — SOTALOL HCL 80 MG PO TABS: 120.0000 mg | ORAL_TABLET | Freq: Two times a day (BID) | ORAL | Status: DC

## 2014-03-10 NOTE — Patient Instructions (Addendum)
Your physician recommends that you continue on your current medications as directed. Please refer to the Current Medication list given to you today.  Labs today: BMET, Magnesium  Remote monitoring is used to monitor your pacemaker from home. This monitoring reduces the number of office visits required to check your device to one time per year. It allows [place] to keep an eye on the functioning of your device to ensure it is working properly. You are scheduled for a device check from home on 06-13-2014. You may send your transmission at any time that day. If you have a wireless device, the transmission will be sent automatically. After your physician reviews your transmission, you will receive a postcard with your next transmission date.  Your physician recommends that you schedule a follow-up appointment in: 6 months with Dr.Klein

## 2014-03-10 NOTE — Progress Notes (Signed)
Patient Care Team: Irene Pap, NP as PCP - General (Nurse Practitioner)   HPI  Brittany [name] is a 80 y.o. female Seen in followup for  pacer implanted 12/12 for sinus node dysfunction.  She has atrial fibrillation for which she takes sotalol currently of 120 mg twice daily.    Cardiac evaluation has included a Myoview 8/12 which demonstrated no ischemia and normal left ventricular function  The patient denies chest pain, shortness of breath, nocturnal dyspnea, orthopnea or peripheral edema.  There have been no palpitations, lightheadedness or syncope.   Thromboembolic risk factors include age-88 hypertension-1 for a CHADS-  score of 2 and a CHADS-VASc score of 4 we had discussions re anticoagulation;  she has been averse    Past Medical History  Diagnosis Date  . Chicken pox   . GERD (gastroesophageal reflux disease)   . Hypertension   . Colon polyps   . Urine incontinence   . Depression   . Thyroid disease   . Urinary incontinence   . Atrial fibrillation 03/04/2013  . Sinus node dysfunction 03/04/2013  . Pacemaker-Medtronic 03/04/2013  . Arthritis   . Cataract     removed  . Osteoporosis     Past Surgical History  Procedure Laterality Date  . Abdominal hysterectomy  1970  . Cholecystectomy  08/11/1999  . Tonsillectomy and adenoidectomy    . Pacemaker placement Right   . Cataract extraction    . Colonoscopy      Current Outpatient Prescriptions  Medication Sig Dispense Refill  . amLODipine (NORVASC) 5 MG tablet Take 1 tablet (5 mg total) by mouth daily.  90 tablet  1  . aspirin 81 MG tablet Take 81 mg by mouth daily.      . B Complex-C (B-COMPLEX WITH VITAMIN C) tablet Take 1 tablet by mouth daily.      . benazepril-hydrochlorthiazide (LOTENSIN HCT) 20-12.5 MG per tablet Take 0.5 tablets by mouth daily.  90 tablet  1  . desmopressin (DDAVP) 0.2 MG tablet Take 1 tablet (0.2 mg total) by mouth daily.  90 tablet  1  . levothyroxine (SYNTHROID, LEVOTHROID) 25  MCG tablet Take 1 tablet (25 mcg total) by mouth daily before breakfast.  90 tablet  2  . Lutein-Zeaxanthin 25-5 MG CAPS Take by mouth daily.      . Misc Natural Products (OSTEO BI-FLEX ADV DOUBLE ST PO) Take by mouth 2 (two) times daily.      . Multiple Vitamin (MULTIVITAMIN WITH MINERALS) TABS tablet Take 1 tablet by mouth daily.      . Omega-3 Fatty Acids (FISH OIL) 1200 MG CAPS Take 1 capsule by mouth 2 (two) times daily.      Marland Kitchen omeprazole (PRILOSEC) 20 MG capsule Take 1 capsule (20 mg total) by mouth daily.  90 capsule  1  . solifenacin (VESICARE) 10 MG tablet Take 1 tablet (10 mg total) by mouth daily.  90 tablet  0  . sotalol (BETAPACE) 80 MG tablet Take 1.5 tablets (120 mg total) by mouth 2 (two) times daily.  90 tablet  2   No current facility-administered medications for this visit.    Allergies  Allergen Reactions  . Adhesive [Tape] Swelling and Rash  . Neosporin [Neomycin-Polymyxin-Gramicidin] Swelling and Rash    Review of Systems negative except from HPI and PMH  Physical Exam BP 144/70  Pulse 65  Ht 5\' 8"  (1.727 m)  Wt 174 lb (78.926 kg)  BMI 26.46 kg/m2  LMP  07/22/1968 Well developed and well nourished in no acute distress HENT normal E scleral and icterus clear Neck Supple JVP flat; carotids brisk and full Clear to ausculation  Regular rate and rhythm, no murmurs gallops or rub Soft with active bowel sounds No clubbing cyanosis  Edema Alert and oriented, grossly normal motor and sensory function Skin Warm and Dry  ECG demonstrated atrial pacing at 65 Intervals 18/10/44  Assessment and  Plan  Atrial fibrillation-paroxysmal  Sinus bradycardia  Pacemaker-Medtronic  The patient's device was interrogated.  The information was reviewed. No changes were made in the programming.    Hypertension  Pressure is well-controlled  No intercurrent atrial fibrillation;   We  had a lengthy discussion regarding the relative risks and benefits of aspirin versus  anticoagulation. She is a averse anticoagulation  but but will think about it  100% atrial paced with good heart rate excursion  We will check potassium and magnesium levels on sotalol. QT interval is within range

## 2014-03-11 ENCOUNTER — Telehealth: Payer: Self-pay | Admitting: *Deleted

## 2014-03-11 NOTE — Telephone Encounter (Signed)
-----   Message from Irene Pap, NP sent at 03/10/2014 5:04 PM ----- pls call pt: Advise I have referred her to pulmonology for follow up of Obstructive sleep apnea, per her ins company requests.

## 2014-03-11 NOTE — Progress Notes (Signed)
Patient notified

## 2014-03-11 NOTE — Telephone Encounter (Signed)
Patient notified

## 2014-03-14 ENCOUNTER — Encounter: Payer: Self-pay | Admitting: Internal Medicine

## 2014-03-15 ENCOUNTER — Encounter: Payer: Self-pay | Admitting: Internal Medicine

## 2014-03-15 DIAGNOSIS — H353290 Exudative age-related macular degeneration, unspecified eye, stage unspecified: Secondary | ICD-10-CM | POA: Diagnosis not present

## 2014-03-15 DIAGNOSIS — H353190 Nonexudative age-related macular degeneration, unspecified eye, stage unspecified: Secondary | ICD-10-CM | POA: Diagnosis not present

## 2014-03-15 NOTE — Telephone Encounter (Signed)
This encounter was created in error - please disregard.

## 2014-03-15 NOTE — Telephone Encounter (Signed)
New message ° ° ° ° °Want lab results °

## 2014-03-16 ENCOUNTER — Telehealth: Payer: Self-pay | Admitting: Nurse Practitioner

## 2014-03-16 NOTE — Telephone Encounter (Signed)
Patient is requesting CB about [employer] request for CPAP paperwork.

## 2014-03-21 ENCOUNTER — Telehealth: Payer: Self-pay | Admitting: Internal Medicine

## 2014-03-21 MED ORDER — MAGNESIUM OXIDE 400 MG PO TABS: 400.0000 mg | ORAL_TABLET | Freq: Every day | ORAL | Status: DC

## 2014-03-21 NOTE — Telephone Encounter (Signed)
Reviewed lab results and Dr. Olin Pia recommendations to start OTC magnesium oxide 400 mg daily with pt.

## 2014-03-21 NOTE — Telephone Encounter (Signed)
New message ° ° ° ° °Want lab results °

## 2014-03-22 NOTE — Telephone Encounter (Signed)
pls call pt: Advise I have referred her to pulmonology for treatment of OSA. [facility] is not accepting the "face-toface" encounter she had with me, they want more documentation like sleep studies before & after CPAP. We faxed the studies she had in [place], but they are still requesting same info.  Sharyn Lull spoke with her on 8/21 about referral.  Please make sure her referral is being processed-she should have gotten call for appt. Already. Thanks.

## 2014-03-29 ENCOUNTER — Ambulatory Visit: Payer: Medicare Other | Admitting: Cardiology

## 2014-03-30 ENCOUNTER — Ambulatory Visit: Payer: Medicare Other | Admitting: Cardiology

## 2014-03-31 ENCOUNTER — Ambulatory Visit: Payer: Medicare Other | Admitting: Cardiology

## 2014-03-31 ENCOUNTER — Institutional Professional Consult (permissible substitution): Payer: Medicare Other | Admitting: Pulmonary Disease

## 2014-04-01 ENCOUNTER — Emergency Department (HOSPITAL_BASED_OUTPATIENT_CLINIC_OR_DEPARTMENT_OTHER)
Admission: EM | Admit: 2014-04-01 | Discharge: 2014-04-01 | Disposition: A | Discharge status: Home / Self Care | Payer: Medicare Other | Attending: Emergency Medicine | Admitting: Emergency Medicine

## 2014-04-01 ENCOUNTER — Encounter (HOSPITAL_BASED_OUTPATIENT_CLINIC_OR_DEPARTMENT_OTHER): Payer: Self-pay | Admitting: Emergency Medicine

## 2014-04-01 DIAGNOSIS — Z87891 Personal history of nicotine dependence: Secondary | ICD-10-CM | POA: Insufficient documentation

## 2014-04-01 DIAGNOSIS — Z8619 Personal history of other infectious and parasitic diseases: Secondary | ICD-10-CM | POA: Insufficient documentation

## 2014-04-01 DIAGNOSIS — Z860100 Personal history of colon polyps, unspecified: Secondary | ICD-10-CM | POA: Insufficient documentation

## 2014-04-01 DIAGNOSIS — IMO0002 Reserved for concepts with insufficient information to code with codable children: Secondary | ICD-10-CM | POA: Insufficient documentation

## 2014-04-01 DIAGNOSIS — T4995XA Adverse effect of unspecified topical agent, initial encounter: Secondary | ICD-10-CM | POA: Insufficient documentation

## 2014-04-01 DIAGNOSIS — E079 Disorder of thyroid, unspecified: Secondary | ICD-10-CM | POA: Insufficient documentation

## 2014-04-01 DIAGNOSIS — Z8669 Personal history of other diseases of the nervous system and sense organs: Secondary | ICD-10-CM | POA: Insufficient documentation

## 2014-04-01 DIAGNOSIS — F3289 Other specified depressive episodes: Secondary | ICD-10-CM | POA: Diagnosis not present

## 2014-04-01 DIAGNOSIS — Z79899 Other long term (current) drug therapy: Secondary | ICD-10-CM | POA: Diagnosis not present

## 2014-04-01 DIAGNOSIS — Z7982 Long term (current) use of aspirin: Secondary | ICD-10-CM | POA: Diagnosis not present

## 2014-04-01 DIAGNOSIS — M129 Arthropathy, unspecified: Secondary | ICD-10-CM | POA: Insufficient documentation

## 2014-04-01 DIAGNOSIS — M48062 Spinal stenosis, lumbar region with neurogenic claudication: Secondary | ICD-10-CM | POA: Diagnosis not present

## 2014-04-01 DIAGNOSIS — F329 Major depressive disorder, single episode, unspecified: Secondary | ICD-10-CM | POA: Diagnosis not present

## 2014-04-01 DIAGNOSIS — T782XXA Anaphylactic shock, unspecified, initial encounter: Secondary | ICD-10-CM | POA: Diagnosis not present

## 2014-04-01 DIAGNOSIS — R21 Rash and other nonspecific skin eruption: Secondary | ICD-10-CM | POA: Insufficient documentation

## 2014-04-01 DIAGNOSIS — K219 Gastro-esophageal reflux disease without esophagitis: Secondary | ICD-10-CM | POA: Diagnosis not present

## 2014-04-01 DIAGNOSIS — I10 Essential (primary) hypertension: Secondary | ICD-10-CM | POA: Diagnosis not present

## 2014-04-01 MED ORDER — DIPHENHYDRAMINE HCL 50 MG/ML IJ SOLN
25.0000 mg | Freq: Once | INTRAMUSCULAR | Status: AC
  Administered 2014-04-01: 25 mg via INTRAVENOUS

## 2014-04-01 MED ORDER — PREDNISONE 10 MG PO TABS: 20.0000 mg | ORAL_TABLET | Freq: Every day | ORAL | Status: DC

## 2014-04-01 MED ORDER — EPINEPHRINE 0.3 MG/0.3ML IJ SOAJ: 0.3000 mg | Freq: Once | INTRAMUSCULAR | Status: DC

## 2014-04-01 MED ORDER — EPINEPHRINE HCL 1 MG/ML IJ SOLN
0.1500 mg | Freq: Once | INTRAMUSCULAR | Status: AC
  Administered 2014-04-01: 0.15 mg via INTRAMUSCULAR

## 2014-04-01 MED ORDER — FAMOTIDINE 20 MG PO TABS: 20.0000 mg | ORAL_TABLET | Freq: Two times a day (BID) | ORAL | Status: DC

## 2014-04-01 MED ORDER — FAMOTIDINE IN NACL 20-0.9 MG/50ML-% IV SOLN
20.0000 mg | Freq: Once | INTRAVENOUS | Status: AC
  Administered 2014-04-01: 20 mg via INTRAVENOUS

## 2014-04-01 MED ORDER — METHYLPREDNISOLONE SODIUM SUCC 125 MG IJ SOLR
125.0000 mg | Freq: Once | INTRAMUSCULAR | Status: AC
  Administered 2014-04-01: 125 mg via INTRAVENOUS

## 2014-04-01 MED ORDER — DIPHENHYDRAMINE HCL 25 MG PO TABS: 25.0000 mg | ORAL_TABLET | Freq: Four times a day (QID) | ORAL | Status: DC

## 2014-04-01 NOTE — ED Provider Notes (Signed)
CSN: [id number]     Arrival date & time 04/01/14  1342 History   First MD Initiated Contact with Patient 04/01/14 1402     Chief Complaint  Patient presents with  . Allergic Reaction     (Consider location/radiation/quality/duration/timing/severity/associated sxs/prior Treatment) Patient is a 80 y.o. female presenting with allergic reaction. The history is provided by the patient and the spouse.  Allergic Reaction Presenting symptoms: difficulty breathing, itching, rash and swelling   Difficulty breathing:    Severity:  Moderate   Onset quality:  Sudden   Timing:  Constant   Progression:  Unchanged Itching:    Location:  Full body Rash:    Location:  Full body   Quality: itchiness and redness     Onset quality:  Sudden   Duration:  1 hour   Timing:  Constant   Progression:  Unchanged Severity:  Moderate Prior allergic episodes:  No prior episodes Context: food (protein bar sample from [employer])     Past Medical History  Diagnosis Date  . Chicken pox   . GERD (gastroesophageal reflux disease)   . Hypertension   . Colon polyps   . Urine incontinence   . Depression   . Thyroid disease   . Urinary incontinence   . Atrial fibrillation 03/04/2013  . Sinus node dysfunction 03/04/2013  . Pacemaker-Medtronic 03/04/2013  . Arthritis   . Cataract     removed  . Osteoporosis    Past Surgical History  Procedure Laterality Date  . Abdominal hysterectomy  1970  . Cholecystectomy  08/11/1999  . Tonsillectomy and adenoidectomy    . Pacemaker placement Right   . Cataract extraction    . Colonoscopy     Family History  Problem Relation Age of Onset  . Cancer Mother     colon  . Cancer Father     unknown type  . Stroke Sister   . Skin cancer Brother   . Cancer Paternal Aunt     breast  . Heart disease Brother   . Asthma Brother    History  Substance Use Topics  . Smoking status: Former Smoker -- 1.00 packs/day for 45 years    Types: Cigarettes    Quit date:  02/24/1991  . Smokeless tobacco: Never Used  . Alcohol Use: Yes     Comment: Wine nightly   OB History   Grav Para Term Preterm Abortions TAB SAB Ect Mult Living                 Review of Systems  Constitutional: Negative for fever, chills, diaphoresis, activity change, appetite change and fatigue.  HENT: Positive for facial swelling. Negative for congestion, rhinorrhea and sore throat.   Eyes: Negative for photophobia and discharge.  Respiratory: Positive for shortness of breath. Negative for cough and chest tightness.   Cardiovascular: Negative for chest pain, palpitations and leg swelling.  Gastrointestinal: Positive for nausea. Negative for vomiting, abdominal pain and diarrhea.  Endocrine: Negative for polydipsia and polyuria.  Genitourinary: Negative for dysuria, frequency, difficulty urinating and pelvic pain.  Musculoskeletal: Negative for arthralgias, back pain, neck pain and neck stiffness.  Skin: Positive for itching and rash. Negative for color change and wound.  Allergic/Immunologic: Negative for immunocompromised state.  Neurological: Negative for facial asymmetry, weakness, numbness and headaches.  Hematological: Does not bruise/bleed easily.  Psychiatric/Behavioral: Negative for confusion and agitation.      Allergies  Adhesive and Neosporin  Home Medications   Prior to Admission medications  Medication Sig Start Date End Date Taking? Authorizing Provider  amLODipine (NORVASC) 5 MG tablet Take 1 tablet (5 mg total) by mouth daily. 12/24/13   Irene Pap, NP  aspirin 81 MG tablet Take 81 mg by mouth daily.    Historical Provider, MD  B Complex-C (B-COMPLEX WITH VITAMIN C) tablet Take 1 tablet by mouth daily.    Historical Provider, MD  benazepril-hydrochlorthiazide (LOTENSIN HCT) 20-12.5 MG per tablet Take 0.5 tablets by mouth daily. 12/22/13   Irene Pap, NP  desmopressin (DDAVP) 0.2 MG tablet Take 1 tablet (0.2 mg total) by mouth daily. 12/22/13   Irene Pap, NP  diphenhydrAMINE (BENADRYL) 25 MG tablet Take 1 tablet (25 mg total) by mouth every 6 (six) hours. 04/01/14   Ernestina Patches, MD  famotidine (PEPCID) 20 MG tablet Take 1 tablet (20 mg total) by mouth 2 (two) times daily. 04/01/14   Ernestina Patches, MD  levothyroxine (SYNTHROID, LEVOTHROID) 25 MCG tablet Take 1 tablet (25 mcg total) by mouth daily before breakfast. 12/22/13   Irene Pap, NP  Lutein-Zeaxanthin 25-5 MG CAPS Take by mouth daily.    Historical Provider, MD  magnesium oxide (MAG-OX) 400 MG tablet Take 1 tablet (400 mg total) by mouth daily. 03/21/14   Deboraha Sprang, MD  Misc Natural Products (OSTEO BI-FLEX ADV DOUBLE ST PO) Take by mouth 2 (two) times daily.    Historical Provider, MD  Multiple Vitamin (MULTIVITAMIN WITH MINERALS) TABS tablet Take 1 tablet by mouth daily.    Historical Provider, MD  Omega-3 Fatty Acids (FISH OIL) 1200 MG CAPS Take 1 capsule by mouth 2 (two) times daily.    Historical Provider, MD  omeprazole (PRILOSEC) 20 MG capsule Take 1 capsule (20 mg total) by mouth daily. 12/22/13   Irene Pap, NP  predniSONE (DELTASONE) 10 MG tablet Take 2 tablets (20 mg total) by mouth daily. 04/01/14   Ernestina Patches, MD  solifenacin (VESICARE) 10 MG tablet Take 1 tablet (10 mg total) by mouth daily. 12/22/13   Irene Pap, NP  sotalol (BETAPACE) 80 MG tablet Take 1.5 tablets (120 mg total) by mouth 2 (two) times daily. 03/10/14   Deboraha Sprang, MD   BP 128/53  Pulse 60  Resp 21  Ht 5\' 9"  (1.753 m)  Wt 169 lb (76.658 kg)  BMI 24.95 kg/m2  SpO2 99%  LMP 07/22/1968 Physical Exam  Constitutional: She is oriented to person, place, and time. She appears well-developed and well-nourished. No distress.  HENT:  Head: Normocephalic and atraumatic.  Mouth/Throat: No oropharyngeal exudate.    Eyes: Pupils are equal, round, and reactive to light.  Neck: Normal range of motion. Neck supple.  Cardiovascular: Normal rate, regular rhythm and normal heart sounds.  Exam  reveals no gallop and no friction rub.   No murmur heard. Pulmonary/Chest: Effort normal and breath sounds normal. No respiratory distress. She has no wheezes. She has no rales.  Abdominal: Soft. Bowel sounds are normal. She exhibits no distension and no mass. There is no tenderness. There is no rebound and no guarding.  Musculoskeletal: Normal range of motion. She exhibits no edema and no tenderness.  Neurological: She is alert and oriented to person, place, and time.  Skin: Skin is warm and dry. Rash noted. Rash is urticarial.  Psychiatric: She has a normal mood and affect.    ED Course  Procedures (including critical care time) Labs Review Labs Reviewed - No data to display  Imaging Review  No results found.   EKG Interpretation None      MDM   Final diagnoses:  Anaphylaxis, initial encounter    Pt is a 80 y.o. female with Pmhx as above who presents with anaphylaxis. Patient states she ate a small protein bar sample at [employer] and had sudden onset of burning in her mouth followed by generalized urticaria, swelling of her lips and tongue as well as shortness of breath, nausea and upset stomach. On arrival to the ED she is 90% on room air. Blood pressure and heart rate are stable. Lungs are clear. She has edema of both lips but posterior oropharynx is clear. Abdominal exam is benign. We'll treat for anaphylaxis with half dose of epi given age and cardiac history of A. fib and sinus node dysfunction, as well as Benadryl, Solu-Medrol, Pepcid. We'll observe in the ED for at least 4 hours (around 6pm)  4:27 PM Pt feeling improved. Will continue to observe. Assuming she does not worsen, will d/c home with 3 days of pepcid, prednisone, and benadryl.         Ernestina Patches, MD 04/01/14 [phone]

## 2014-04-01 NOTE — ED Notes (Signed)
Pt. Reports she ate something at [employer] today with peanut butter in it and has no history of allergies to peanut butter that she reports.

## 2014-04-01 NOTE — Discharge Instructions (Signed)
Anaphylactic Reaction °An anaphylactic reaction is a sudden, severe allergic reaction. It affects the whole body. It can be life threatening. You may need to stay in the hospital.  °HOME CARE °· Wear a medical bracelet or necklace that lists your allergy. °· Carry your allergy kit or medicine shot to treat severe allergic reactions with you. These can save your life. °· Do not drive until medicine from your shot has worn off, unless your doctor says it is okay. °· If you have hives or a rash: °¨ Take medicine as told by your doctor. °¨ You may take over-the-counter antihistamine medicine. °¨ Place cold cloths on your skin. Take baths in cool water. Avoid hot baths and hot showers. °GET HELP RIGHT AWAY IF:  °· Your mouth is puffy (swollen), or you have trouble breathing. °· You start making whistling sounds when you breathe (wheezing). °· You have a tight feeling in your chest or throat. °· You have a rash, hives, puffiness, or itching on your body. °· You throw up (vomit) or have watery poop (diarrhea). °· You feel dizzy or pass out (faint). °· You think you are having an allergic reaction. °· You have new symptoms. °This is an emergency. Use your medicine shot or allergy kit as told. Call your local emergency services (911 in [place] Even if you feel better after the shot, you need to go to the hospital emergency department. °MAKE SURE YOU:  °· Understand these instructions. °· Will watch your condition. °· Will get help right away if you are not doing well or get worse. °Document Released: 12/25/2007 Document Revised: 01/07/2012 Document Reviewed: 10/09/2011 °ExitCare® Patient Information ©2015 [employer]. This information is not intended to replace advice given to you by your health care provider. Make sure you discuss any questions you have with your health care provider. ° °

## 2014-04-01 NOTE — ED Provider Notes (Signed)
Pt is asymptomatic .  Ready to go home.  Will give her an epi pen rx.  She was given epi in the ED for her reaction.  Discussed the use of this with the patient and her husband.  Dorie Rank, MD 04/01/14 [phone]

## 2014-04-04 ENCOUNTER — Other Ambulatory Visit: Payer: Self-pay | Admitting: Nurse Practitioner

## 2014-04-04 ENCOUNTER — Telehealth: Payer: Self-pay

## 2014-04-04 DIAGNOSIS — T7800XA Anaphylactic reaction due to unspecified food, initial encounter: Secondary | ICD-10-CM

## 2014-04-04 NOTE — Telephone Encounter (Signed)
Spoke with pt, advised referral sent in for allergist.

## 2014-04-04 NOTE — Telephone Encounter (Signed)
Order placed for allergist. pls adv pt.

## 2014-04-04 NOTE — Progress Notes (Signed)
Per ED notes, pt had anaphylactic reaction to nutrition bar at [employer]. Treated & released in ER. Ref to allergist for food testing.

## 2014-04-04 NOTE — Telephone Encounter (Signed)
Pt was in the ED for an allergic reaction and the ED physician suggested she see an allergist. Does she need to see [facility]ne first for referral or can we just set this up for pt? Please advise.

## 2014-04-05 ENCOUNTER — Encounter: Payer: Self-pay | Admitting: Nurse Practitioner

## 2014-04-18 DIAGNOSIS — T783XXA Angioneurotic edema, initial encounter: Secondary | ICD-10-CM | POA: Diagnosis not present

## 2014-04-18 DIAGNOSIS — T7800XA Anaphylactic reaction due to unspecified food, initial encounter: Secondary | ICD-10-CM | POA: Diagnosis not present

## 2014-04-18 NOTE — Progress Notes (Signed)
LM for patient to CB °

## 2014-04-18 NOTE — Telephone Encounter (Signed)
Patient aware. She said she will definitely keep her appt with [facility].

## 2014-04-19 DIAGNOSIS — T783XXA Angioneurotic edema, initial encounter: Secondary | ICD-10-CM | POA: Diagnosis not present

## 2014-04-19 DIAGNOSIS — T7800XA Anaphylactic reaction due to unspecified food, initial encounter: Secondary | ICD-10-CM | POA: Diagnosis not present

## 2014-04-20 ENCOUNTER — Encounter: Payer: Self-pay | Admitting: Pulmonary Disease

## 2014-04-20 ENCOUNTER — Ambulatory Visit (INDEPENDENT_AMBULATORY_CARE_PROVIDER_SITE_OTHER): Payer: Medicare Other | Admitting: Pulmonary Disease

## 2014-04-20 VITALS — BP 120/70 | HR 61 | Temp 98.5°F | Ht 68.0 in | Wt 176.4 lb

## 2014-04-20 DIAGNOSIS — G4733 Obstructive sleep apnea (adult) (pediatric): Secondary | ICD-10-CM | POA: Diagnosis not present

## 2014-04-20 DIAGNOSIS — Z9989 Dependence on other enabling machines and devices: Secondary | ICD-10-CM

## 2014-04-20 NOTE — Patient Instructions (Signed)
Will arrange for sleep study Will call to arrange for follow up after sleep study reviewed 

## 2014-04-20 NOTE — Progress Notes (Deleted)
   Subjective:    Patient ID: Brittany [name], female    DOB: 1934-03-19, 80 y.o.   MRN: [id number]  HPI    Review of Systems  Constitutional: Negative for fever and unexpected weight change.  HENT: Negative for congestion, dental problem, ear pain, nosebleeds, postnasal drip, rhinorrhea, sinus pressure, sneezing, sore throat and trouble swallowing.   Eyes: Negative for redness and itching.  Respiratory: Negative for cough, chest tightness, shortness of breath and wheezing.   Cardiovascular: Negative for palpitations and leg swelling.       Patient has a pacemaker  Gastrointestinal: Negative for nausea and vomiting.  Genitourinary: Negative for dysuria.  Musculoskeletal: Negative for joint swelling.  Skin: Negative for rash.  Neurological: Negative for headaches.  Hematological: Does not bruise/bleed easily.  Psychiatric/Behavioral: Negative for dysphoric mood. The patient is nervous/anxious ( treated with medication).        Objective:   Physical Exam        Assessment & Plan:

## 2014-04-20 NOTE — Assessment & Plan Note (Signed)
She has snoring, sleep disruption, witnessed apnea, and daytime sleepiness.  She has hx of HTN on several medications and depression.  She has prior dx of OSA and had good response to CPAP therapy.  She changed insurance provider, and unfortunately now needs to requalify for CPAP therapy due to insurance purposes.  As a result she will need a repeat sleep study.  We discussed how sleep apnea can affect various health problems including risks for hypertension, cardiovascular disease, and diabetes.  We also discussed how sleep disruption can increase risks for accident, such as while driving.  Weight loss as a means of improving sleep apnea was also reviewed.  Additional treatment options discussed were CPAP therapy, oral appliance, and surgical intervention.  Will schedule follow up after review of her in lab sleep study.

## 2014-04-20 NOTE — Progress Notes (Signed)
Chief Complaint  Patient presents with  . SLEEP CONSULT    Referred by Dr Kathlen Mody. Sleep study 06/2012 in [place].  Current CPAP pressure 8. Epworth Score: 5    History of Present Illness: Brittany [name] is a 80 y.o. female for evaluation of sleep problems.  She had sleep study in 2014 while living in [place].  She was having trouble with snoring, and was told by her husband that she would stop breathing while asleep.  She would wake up with a snort, and was a restless sleeper. She would be tired and sleepy all the time.  She was found to have moderate sleep apnea and started on CPAP.  Her sleep pattern and daytime alertness improved with CPAP.  She moved to [facility], and changed DME's.  She also changed insurance providers.  Her DME has informed her that she does not have adequate documentation for medicare to qualify for continued CPAP supplies.  She was therefore referred to pulmonary/sleep medicine to further assess.  She goes to sleep at 10 pm.  She falls asleep after 20 minutes.  She wakes up 2 to 3 times to use the bathroom.  She gets out of bed at 730 am.  She feels okay in the morning if she can use her CPAP, but was miserable in the morning w/o CPAP.  She denies morning headache since using CPAP.  She does not use anything to help her fall sleep or stay awake.  She denies sleep walking, sleep talking, bruxism, or nightmares.  There is no history of restless legs.  She denies sleep hallucinations, sleep paralysis, or cataplexy.  The Epworth score is 5 out of 24 since using CPAP; prior to using CPAP her Epworth score is 13 out of 24.  Tests: PSG 08/13/12 ([place]) >> AHI 24, SaO2 low 72%, CPAP 8  Brittany [name]  has a past medical history of Chicken pox; GERD (gastroesophageal reflux disease); Hypertension; Colon polyps; Urine incontinence; Depression; Thyroid disease; Urinary incontinence; Atrial fibrillation (03/04/2013); Sinus node dysfunction (03/04/2013); Pacemaker-Medtronic  (03/04/2013); Arthritis; Cataract; and Osteoporosis.  Brittany [name]  has past surgical history that includes Abdominal hysterectomy (1970); Cholecystectomy (08/11/1999); Tonsillectomy and adenoidectomy; pacemaker placement (Right); Cataract extraction; and Colonoscopy.  Prior to Admission medications   Medication Sig Start Date End Date Taking? Authorizing Provider  amLODipine (NORVASC) 5 MG tablet Take 1 tablet (5 mg total) by mouth daily. 12/24/13  Yes Irene Pap, NP  aspirin 81 MG tablet Take 81 mg by mouth daily.   Yes Historical Provider, MD  B Complex-C (B-COMPLEX WITH VITAMIN C) tablet Take 1 tablet by mouth daily.   Yes Historical Provider, MD  benazepril-hydrochlorthiazide (LOTENSIN HCT) 20-12.5 MG per tablet Take 0.5 tablets by mouth daily. 12/22/13  Yes Irene Pap, NP  citalopram (CELEXA) 20 MG tablet Take 20 mg by mouth daily.   Yes Historical Provider, MD  desmopressin (DDAVP) 0.2 MG tablet Take 1 tablet (0.2 mg total) by mouth daily. 12/22/13  Yes Irene Pap, NP  EPINEPHrine 0.3 mg/0.3 mL IJ SOAJ injection Inject 0.3 mLs (0.3 mg total) into the muscle once. 04/01/14  Yes Dorie Rank, MD  levothyroxine (SYNTHROID, LEVOTHROID) 25 MCG tablet Take 1 tablet (25 mcg total) by mouth daily before breakfast. 12/22/13  Yes Irene Pap, NP  Lutein-Zeaxanthin 25-5 MG CAPS Take by mouth daily.   Yes Historical Provider, MD  magnesium oxide (MAG-OX) 400 MG tablet Take 1 tablet (400 mg total) by mouth daily. 03/21/14  Yes Remo Lipps  Peterson Lombard, MD  Misc Natural Products (GLUCOSAMINE CHONDROITIN MSM PO) Take 1,500 mg by mouth 2 (two) times daily.   Yes Historical Provider, MD  Multiple Vitamin (MULTIVITAMIN WITH MINERALS) TABS tablet Take 1 tablet by mouth daily.   Yes Historical Provider, MD  Omega-3 Fatty Acids (FISH OIL) 1200 MG CAPS Take 1 capsule by mouth 2 (two) times daily.   Yes Historical Provider, MD  omeprazole (PRILOSEC) 20 MG capsule Take 1 capsule (20 mg total) by mouth daily. 12/22/13  Yes  Irene Pap, NP  solifenacin (VESICARE) 10 MG tablet Take 1 tablet (10 mg total) by mouth daily. 12/22/13  Yes Irene Pap, NP  sotalol (BETAPACE) 80 MG tablet Take 1.5 tablets (120 mg total) by mouth 2 (two) times daily. 03/10/14  Yes Deboraha Sprang, MD    Allergies  Allergen Reactions  . Adhesive [Tape] Swelling and Rash  . Neosporin [Neomycin-Polymyxin-Gramicidin] Swelling and Rash    Her family history includes Asthma in her brother; Cancer in her father, mother, and paternal aunt; Heart disease in her brother; Skin cancer in her brother; Stroke in her sister.  She  reports that she quit smoking about 23 years ago. Her smoking use included Cigarettes. She has a 45 pack-year smoking history. She has never used smokeless tobacco. She reports that she drinks alcohol. She reports that she does not use illicit drugs.  Review of Systems  Constitutional: Negative for fever and unexpected weight change.  HENT: Negative for congestion, dental problem, ear pain, nosebleeds, postnasal drip, rhinorrhea, sinus pressure, sneezing, sore throat and trouble swallowing.   Eyes: Negative for redness and itching.  Respiratory: Negative for cough, chest tightness, shortness of breath and wheezing.   Cardiovascular: Negative for palpitations and leg swelling.       Patient has a pacemaker  Gastrointestinal: Negative for nausea and vomiting.  Genitourinary: Negative for dysuria.  Musculoskeletal: Negative for joint swelling.  Skin: Negative for rash.  Neurological: Negative for headaches.  Hematological: Does not bruise/bleed easily.  Psychiatric/Behavioral: Negative for dysphoric mood. The patient is nervous/anxious ( treated with medication).    Physical Exam:  General - No distress ENT - No sinus tenderness, no oral exudate, no LAN, no thyromegaly, TM clear, pupils equal/reactive, MP 3, scalloped tongue Cardiac - s1s2 regular, no murmur, pulses symmetric Chest - No wheeze/rales/dullness, good  air entry, normal respiratory excursion Back - No focal tenderness Abd - Soft, non-tender, no organomegaly, + bowel sounds Ext - No edema Neuro - Normal strength, cranial nerves intact Skin - No rashes Psych - Normal mood, and behavior  Assessment/plan:  Chesley Mires, M.D. Pager [phone]

## 2014-04-29 ENCOUNTER — Encounter: Payer: Self-pay | Admitting: Nurse Practitioner

## 2014-04-29 ENCOUNTER — Telehealth: Payer: Self-pay | Admitting: Nurse Practitioner

## 2014-04-29 ENCOUNTER — Ambulatory Visit (HOSPITAL_BASED_OUTPATIENT_CLINIC_OR_DEPARTMENT_OTHER)
Admission: RE | Admit: 2014-04-29 | Discharge: 2014-04-29 | Disposition: A | Discharge status: Home / Self Care | Payer: Medicare Other | Source: Ambulatory Visit | Attending: Nurse Practitioner | Admitting: Nurse Practitioner

## 2014-04-29 ENCOUNTER — Other Ambulatory Visit: Payer: Self-pay | Admitting: Nurse Practitioner

## 2014-04-29 ENCOUNTER — Ambulatory Visit (INDEPENDENT_AMBULATORY_CARE_PROVIDER_SITE_OTHER): Payer: Medicare Other | Admitting: Nurse Practitioner

## 2014-04-29 VITALS — BP 96/59 | HR 66 | Temp 98.6°F | Ht 66.0 in

## 2014-04-29 DIAGNOSIS — S50311A Abrasion of right elbow, initial encounter: Secondary | ICD-10-CM | POA: Diagnosis not present

## 2014-04-29 DIAGNOSIS — S6981XA Other specified injuries of right wrist, hand and finger(s), initial encounter: Secondary | ICD-10-CM | POA: Diagnosis not present

## 2014-04-29 DIAGNOSIS — W19XXXA Unspecified fall, initial encounter: Secondary | ICD-10-CM | POA: Insufficient documentation

## 2014-04-29 DIAGNOSIS — Z780 Asymptomatic menopausal state: Secondary | ICD-10-CM

## 2014-04-29 DIAGNOSIS — M25531 Pain in right wrist: Secondary | ICD-10-CM | POA: Diagnosis present

## 2014-04-29 DIAGNOSIS — S4991XA Unspecified injury of right shoulder and upper arm, initial encounter: Secondary | ICD-10-CM

## 2014-04-29 DIAGNOSIS — S80211A Abrasion, right knee, initial encounter: Secondary | ICD-10-CM | POA: Diagnosis not present

## 2014-04-29 DIAGNOSIS — Z1239 Encounter for other screening for malignant neoplasm of breast: Secondary | ICD-10-CM | POA: Diagnosis not present

## 2014-04-29 DIAGNOSIS — M25421 Effusion, right elbow: Secondary | ICD-10-CM | POA: Diagnosis not present

## 2014-04-29 DIAGNOSIS — M25511 Pain in right shoulder: Secondary | ICD-10-CM | POA: Diagnosis not present

## 2014-04-29 DIAGNOSIS — Z1231 Encounter for screening mammogram for malignant neoplasm of breast: Secondary | ICD-10-CM

## 2014-04-29 DIAGNOSIS — M25521 Pain in right elbow: Secondary | ICD-10-CM | POA: Insufficient documentation

## 2014-04-29 DIAGNOSIS — S59901A Unspecified injury of right elbow, initial encounter: Secondary | ICD-10-CM | POA: Diagnosis not present

## 2014-04-29 NOTE — Patient Instructions (Signed)
Please get xrays.  Daily wound care: wash twice daily with mild soap-Dove bar soap. Pat dry. Apply thin layer vaseline. Cover with non-adherent dressing.  I will call with xray results.  OK to take tylenol for pain. 1000 mg 3 times daily.  Apply ice to elbow for 10 minutes 3 times daily for few days.  Please get mammogram done.

## 2014-04-29 NOTE — Telephone Encounter (Signed)
Patient notified of results and scheduled appt for 05/18/2014 at 10:00 am.

## 2014-04-29 NOTE — Progress Notes (Signed)
Pre visit review using our clinic review tool, if applicable. No additional management support is needed unless otherwise documented below in the visit note. 

## 2014-04-29 NOTE — Telephone Encounter (Signed)
pls call pt: Advise No fractures, she does have some joint swelling in the elbow. She should continue with instructions given: ice & tylenol. Wear sling for comfort, but take arm out daily to move shoulder & elbow. She should schedule f/u appointment in 2 to 3 weeks.

## 2014-05-02 NOTE — Progress Notes (Signed)
History of Present Illness   Chief Complaint  Fall Brittany [name] is a 80 y.o. female WHO C/O r WRIST, ELBOW, & SHOULDER PAIN SINCE SHE FELL YESTERDAY IN THE [facility] PARKING LOT. She is accompanied by her husband today. She has a superficial abrasion on R elbow. She denies knee, hip, rib, ankle pain. States she has no other scrapes or bruises. She denies hitting head. Brittany [name] is not sure what caused her fall. She thinks she caught self on another car with R hand. She does not think she made contact with pavement. This is her second fall in 1 year. She is recovering from back surgery.   Past Medical History  Diagnosis Date  . Chicken pox   . GERD (gastroesophageal reflux disease)   . Hypertension   . Colon polyps   . Urine incontinence   . Depression   . Thyroid disease   . Urinary incontinence   . Atrial fibrillation 03/04/2013  . Sinus node dysfunction 03/04/2013  . Pacemaker-Medtronic 03/04/2013  . Arthritis   . Cataract     removed  . Osteoporosis    Family History  Problem Relation Age of Onset  . Cancer Mother     colon  . Cancer Father     unknown type  . Stroke Sister   . Skin cancer Brother   . Cancer Paternal Aunt     breast  . Heart disease Brother   . Asthma Brother    Current Outpatient Prescriptions  Medication Sig Dispense Refill  . amLODipine (NORVASC) 5 MG tablet Take 1 tablet (5 mg total) by mouth daily.  90 tablet  1  . aspirin 81 MG tablet Take 81 mg by mouth daily.      . B Complex-C (B-COMPLEX WITH VITAMIN C) tablet Take 1 tablet by mouth daily.      . benazepril-hydrochlorthiazide (LOTENSIN HCT) 20-12.5 MG per tablet Take 0.5 tablets by mouth daily.  90 tablet  1  . citalopram (CELEXA) 20 MG tablet Take 20 mg by mouth daily.      Marland Kitchen desmopressin (DDAVP) 0.2 MG tablet Take 1 tablet (0.2 mg total) by mouth daily.  90 tablet  1  . EPINEPHrine 0.3 mg/0.3 mL IJ SOAJ injection Inject 0.3 mLs (0.3 mg total) into the muscle once.  1 Device  1  .  levothyroxine (SYNTHROID, LEVOTHROID) 25 MCG tablet Take 1 tablet (25 mcg total) by mouth daily before breakfast.  90 tablet  2  . Lutein-Zeaxanthin 25-5 MG CAPS Take by mouth daily.      . magnesium oxide (MAG-OX) 400 MG tablet Take 1 tablet (400 mg total) by mouth daily.  30 tablet  0  . Misc Natural Products (GLUCOSAMINE CHONDROITIN MSM PO) Take 1,500 mg by mouth 2 (two) times daily.      . Multiple Vitamin (MULTIVITAMIN WITH MINERALS) TABS tablet Take 1 tablet by mouth daily.      . Omega-3 Fatty Acids (FISH OIL) 1200 MG CAPS Take 1 capsule by mouth 2 (two) times daily.      Marland Kitchen omeprazole (PRILOSEC) 20 MG capsule Take 1 capsule (20 mg total) by mouth daily.  90 capsule  1  . solifenacin (VESICARE) 10 MG tablet Take 1 tablet (10 mg total) by mouth daily.  90 tablet  0  . sotalol (BETAPACE) 80 MG tablet Take 1.5 tablets (120 mg total) by mouth 2 (two) times daily.  90 tablet  2   No current facility-administered medications for this visit.  Allergies  Allergen Reactions  . Adhesive [Tape] Swelling and Rash  . Neosporin [Neomycin-Polymyxin-Gramicidin] Swelling and Rash   History   Social History  . Marital Status: Married    Spouse Name: N/A    Number of Children: 4  . Years of Education: 12   Occupational History  . Personell Manager      retired   Social History Main Topics  . Smoking status: Former Smoker -- 1.00 packs/day for 45 years    Types: Cigarettes    Quit date: 02/24/1991  . Smokeless tobacco: Never Used  . Alcohol Use: Yes     Comment: Wine nightly 16oz  . Drug Use: No  . Sexual Activity: Not on file   Other Topics Concern  . Not on file   Social History Narrative   Brittany [name] lives with her husband at [employer]. They are retired and recently relocated to [place] from [place]. She has 4 grown children.    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   Review of Systems Pertinent items are noted in HPI.   Physical Exam    BP 96/59  Pulse 66  Temp(Src) 98.6 F (37 C) (Temporal)  Ht 5\' 6"  (1.676 m)  SpO2 100%  LMP 07/22/1968 BP 96/59  Pulse 66  Temp(Src) 98.6 F (37 C) (Temporal)  Ht 5\' 6"  (1.676 m)  SpO2 100%  LMP 07/22/1968 General appearance: alert, cooperative, appears stated age and mild distress Head: Normocephalic, without obvious abnormality, atraumatic Eyes: negative findings: lids and lashes normal and conjunctivae and sclerae normal Ears: normal TM's and external ear canals both ears Back: symmetric, no curvature. ROM normal. No CVA tenderness. Lungs: clear to auscultation bilaterally Heart: regular rate and rhythm, S1, S2 normal, no murmur, click, rub or gallop Abdomen: soft, non-tender; bowel sounds normal; no masses,  no organomegaly Extremities: superficial abrasion R knee size of quarter, no bruise. superficial abrasion R lateral elbow covers entire elbow-about 3cm across. Incomplete extension & flexion-lacks about 15 degress both ways., No malleolar tenderness. Not able to raise shoulder past 90 degrees without pain. FROM in wrist.radial pulse +2. fingers warm. Entire arm & hand slightly erythematous & mildly swollen.  Pulses: 2+ and symmetric Skin: see extremities  Neurologic: Grossly normal  1. Fall, initial encounter - DG Wrist Complete Right; Future - DG Elbow Complete Right; Future - DG Shoulder Right; Future R Elbow injury R Shoulder injury R wrist injury Abrasion R knee  2. Breast cancer screening  3. Postmenopausal  F/u 2 weeks.

## 2014-05-03 DIAGNOSIS — R220 Localized swelling, mass and lump, head: Secondary | ICD-10-CM | POA: Diagnosis not present

## 2014-05-09 ENCOUNTER — Ambulatory Visit (HOSPITAL_BASED_OUTPATIENT_CLINIC_OR_DEPARTMENT_OTHER)
Admission: RE | Admit: 2014-05-09 | Discharge: 2014-05-09 | Disposition: A | Discharge status: Home / Self Care | Payer: Medicare Other | Source: Ambulatory Visit | Attending: Nurse Practitioner | Admitting: Nurse Practitioner

## 2014-05-09 ENCOUNTER — Ambulatory Visit (HOSPITAL_BASED_OUTPATIENT_CLINIC_OR_DEPARTMENT_OTHER): Payer: Medicare Other

## 2014-05-09 DIAGNOSIS — Z1231 Encounter for screening mammogram for malignant neoplasm of breast: Secondary | ICD-10-CM | POA: Diagnosis not present

## 2014-05-18 ENCOUNTER — Telehealth: Payer: Self-pay | Admitting: *Deleted

## 2014-05-18 ENCOUNTER — Encounter: Payer: Self-pay | Admitting: Nurse Practitioner

## 2014-05-18 ENCOUNTER — Ambulatory Visit (INDEPENDENT_AMBULATORY_CARE_PROVIDER_SITE_OTHER): Payer: Medicare Other | Admitting: Nurse Practitioner

## 2014-05-18 VITALS — BP 110/60 | HR 66 | Temp 98.0°F | Ht 66.0 in | Wt 177.0 lb

## 2014-05-18 DIAGNOSIS — M25511 Pain in right shoulder: Secondary | ICD-10-CM

## 2014-05-18 DIAGNOSIS — Z23 Encounter for immunization: Secondary | ICD-10-CM | POA: Diagnosis not present

## 2014-05-18 DIAGNOSIS — M25519 Pain in unspecified shoulder: Secondary | ICD-10-CM | POA: Insufficient documentation

## 2014-05-18 NOTE — Progress Notes (Signed)
Subjective:     Brittany [name] is a 80 y.o. female presents for follow up of R arm injury secondary to fall. She had elbow effusion & w/superficial abrasion, Shoulder & wrist pain. Wrist, elbow, & shoulder xrays nml.  Today she c/o some pain in shoulder w/ROM. No pain if not ,moving arm. No elbow or wrist pain. Abrasion has healed nicely-pink post-inflammatory changes. I reviewed fall prevention, DEXA scan-nml bone density although radiologist commented on diffuse osteopenia of arm. Pt is wearing L ankle brace-she states she thinks ankle "gave out" contributing to fall. States ankle has been "weak" for a long time-she doesn't always wear brace-wasn't when she fell. Reviewed MMg results & discussed w/pt.-nml & vaccine status-needs pna 13. The following portions of the patient's history were reviewed and updated as appropriate: allergies, current medications, past medical history, past social history, past surgical history and problem list.  Review of Systems Pertinent items are noted in HPI.    Objective:    BP 110/60  Pulse 66  Temp(Src) 98 F (36.7 C) (Oral)  Ht 5\' 6"  (1.676 m)  Wt 177 lb (80.287 kg)  BMI 28.58 kg/m2  SpO2 96%  LMP 07/22/1968 BP 110/60  Pulse 66  Temp(Src) 98 F (36.7 C) (Oral)  Ht 5\' 6"  (1.676 m)  Wt 177 lb (80.287 kg)  BMI 28.58 kg/m2  SpO2 96%  LMP 07/22/1968 General appearance: alert, cooperative, appears stated age and no distress Head: Normocephalic, without obvious abnormality, atraumatic Eyes: negative findings: lids and lashes normal, conjunctivae and sclerae normal and wearing glasses Extremities: Decreased ROM R shoulder-about 100 degrees overhead, about 45 degrees adduction. Skin: healing pink scar at R elbow.    Assessment:   1. Pain in joint, shoulder region, right - Ambulatory referral to Physical Therapy  2. Need for pneumococcal vaccine - Pneumococcal conjugate vaccine 13-valent IM  See pt instructions.

## 2014-05-18 NOTE — Progress Notes (Signed)
Pre visit review using our clinic review tool, if applicable. No additional management support is needed unless otherwise documented below in the visit note. 

## 2014-05-18 NOTE — Telephone Encounter (Signed)
Ariel from Dr. Prudy Feeler office left vm concerning pt's Lisinopril dose. Dr. Clarene Essex advises pt's lisinopril dose be changed due pt's angioedema and based on lab results. Lab results given to Layne.

## 2014-05-18 NOTE — Patient Instructions (Signed)
Do shoulder exercises until you get to physical therapy.  See physical therapist.  Brittany [name] to see you!

## 2014-05-18 NOTE — Assessment & Plan Note (Signed)
Refer to PT

## 2014-05-19 DIAGNOSIS — M6281 Muscle weakness (generalized): Secondary | ICD-10-CM | POA: Diagnosis not present

## 2014-05-19 DIAGNOSIS — M25511 Pain in right shoulder: Secondary | ICD-10-CM | POA: Diagnosis not present

## 2014-05-22 DIAGNOSIS — Z23 Encounter for immunization: Secondary | ICD-10-CM | POA: Diagnosis not present

## 2014-05-23 ENCOUNTER — Telehealth: Payer: Self-pay | Admitting: Nurse Practitioner

## 2014-05-23 DIAGNOSIS — M6281 Muscle weakness (generalized): Secondary | ICD-10-CM | POA: Diagnosis not present

## 2014-05-23 DIAGNOSIS — M25511 Pain in right shoulder: Secondary | ICD-10-CM | POA: Diagnosis not present

## 2014-05-23 NOTE — Telephone Encounter (Signed)
Left message with pt's husband for pt to return call.

## 2014-05-23 NOTE — Telephone Encounter (Signed)
Patient notified and appt scheduled for 05/24/14 at 1:00.

## 2014-05-23 NOTE — Telephone Encounter (Signed)
pls call pt: Advise I would like to schedule appt to discuss allergy symptoms that she saw allergist for. Allergist thinks her bp med should be changed. Would like to speak w/pt before making changes.

## 2014-05-24 ENCOUNTER — Ambulatory Visit (INDEPENDENT_AMBULATORY_CARE_PROVIDER_SITE_OTHER): Payer: Medicare Other | Admitting: Nurse Practitioner

## 2014-05-24 VITALS — BP 125/73 | HR 70 | Temp 97.6°F | Resp 18 | Ht 66.0 in | Wt 179.0 lb

## 2014-05-24 DIAGNOSIS — Z91018 Allergy to other foods: Secondary | ICD-10-CM | POA: Insufficient documentation

## 2014-05-24 NOTE — Progress Notes (Signed)
Pre visit review using our clinic review tool, if applicable. No additional management support is needed unless otherwise documented below in the visit note. 

## 2014-05-24 NOTE — Progress Notes (Signed)
Subjective:     Brittany [name] is a 80 y.o. female presents to discuss allergic symptoms including swelling of lips, & strange sensation with swallowing. Event occurred 20 minutes after she a cliff bar sample at [facility]. Pt was evaluated at ER at time of episode & treated for anaphylactic shock. She was subsequently evaluated by allergist who performed multiple food allergy tests with no conclusive findings. The allergist recommended that lisinopril be stopped as possible source for symptoms. Pt states she has been taking lisinopril for years and has not had return of symptoms. She has epi-pen, but does not carry it in purse.   The following portions of the patient's history were reviewed and updated as appropriate: allergies, current medications, past family history, past medical history, past social history, past surgical history and problem list.  Review of Systems Pertinent items are noted in HPI.    Objective:    LMP 07/22/1968 LMP 07/22/1968 General appearance: alert, cooperative, appears stated age and no distress Head: Normocephalic, without obvious abnormality, atraumatic Eyes: negative findings: lids and lashes normal and conjunctivae and sclerae normal   Assessment:   1. History of food anaphylaxis Encouraged to keep epi-pen in purse. Only has 1 pen-didn't want to pay for script, but allergist gave sample from drug rep. Seems unlikely that 1 episode is r/t to daily med she has been taking for years. No changes. Will continue w/current meds.  Keep f/u appt scheduled in few mos -will check TSH, PTH.

## 2014-05-24 NOTE — Patient Instructions (Signed)
Continue with current meds.

## 2014-05-25 DIAGNOSIS — M25511 Pain in right shoulder: Secondary | ICD-10-CM | POA: Diagnosis not present

## 2014-05-25 DIAGNOSIS — M6281 Muscle weakness (generalized): Secondary | ICD-10-CM | POA: Diagnosis not present

## 2014-05-27 DIAGNOSIS — M6281 Muscle weakness (generalized): Secondary | ICD-10-CM | POA: Diagnosis not present

## 2014-05-27 DIAGNOSIS — M25511 Pain in right shoulder: Secondary | ICD-10-CM | POA: Diagnosis not present

## 2014-05-30 DIAGNOSIS — M25511 Pain in right shoulder: Secondary | ICD-10-CM | POA: Diagnosis not present

## 2014-05-30 DIAGNOSIS — M6281 Muscle weakness (generalized): Secondary | ICD-10-CM | POA: Diagnosis not present

## 2014-06-01 DIAGNOSIS — M6281 Muscle weakness (generalized): Secondary | ICD-10-CM | POA: Diagnosis not present

## 2014-06-01 DIAGNOSIS — M25511 Pain in right shoulder: Secondary | ICD-10-CM | POA: Diagnosis not present

## 2014-06-03 DIAGNOSIS — M6281 Muscle weakness (generalized): Secondary | ICD-10-CM | POA: Diagnosis not present

## 2014-06-03 DIAGNOSIS — M4716 Other spondylosis with myelopathy, lumbar region: Secondary | ICD-10-CM | POA: Diagnosis not present

## 2014-06-03 DIAGNOSIS — M5442 Lumbago with sciatica, left side: Secondary | ICD-10-CM | POA: Diagnosis not present

## 2014-06-03 DIAGNOSIS — M25511 Pain in right shoulder: Secondary | ICD-10-CM | POA: Diagnosis not present

## 2014-06-06 DIAGNOSIS — M25561 Pain in right knee: Secondary | ICD-10-CM | POA: Diagnosis not present

## 2014-06-06 DIAGNOSIS — M25511 Pain in right shoulder: Secondary | ICD-10-CM | POA: Diagnosis not present

## 2014-06-06 DIAGNOSIS — M6281 Muscle weakness (generalized): Secondary | ICD-10-CM | POA: Diagnosis not present

## 2014-06-07 DIAGNOSIS — M25511 Pain in right shoulder: Secondary | ICD-10-CM | POA: Diagnosis not present

## 2014-06-07 DIAGNOSIS — M6281 Muscle weakness (generalized): Secondary | ICD-10-CM | POA: Diagnosis not present

## 2014-06-10 ENCOUNTER — Other Ambulatory Visit: Payer: Self-pay | Admitting: Internal Medicine

## 2014-06-10 DIAGNOSIS — M25511 Pain in right shoulder: Secondary | ICD-10-CM | POA: Diagnosis not present

## 2014-06-10 DIAGNOSIS — M6281 Muscle weakness (generalized): Secondary | ICD-10-CM | POA: Diagnosis not present

## 2014-06-13 ENCOUNTER — Ambulatory Visit (INDEPENDENT_AMBULATORY_CARE_PROVIDER_SITE_OTHER): Payer: Medicare Other | Admitting: *Deleted

## 2014-06-13 DIAGNOSIS — M6281 Muscle weakness (generalized): Secondary | ICD-10-CM | POA: Diagnosis not present

## 2014-06-13 DIAGNOSIS — I495 Sick sinus syndrome: Secondary | ICD-10-CM | POA: Diagnosis not present

## 2014-06-13 DIAGNOSIS — M25511 Pain in right shoulder: Secondary | ICD-10-CM | POA: Diagnosis not present

## 2014-06-13 LAB — MDC_IDC_ENUM_SESS_TYPE_REMOTE
Battery Impedance: 277 Ohm
Battery Remaining Longevity: 100 mo
Battery Voltage: 2.79 V
Brady Statistic AP VP Percent: 0 %
Brady Statistic AP VS Percent: 98 %
Brady Statistic AS VP Percent: 0 %
Brady Statistic AS VS Percent: 2 %
Date Time Interrogation Session: 20151123140432
Lead Channel Impedance Value: 469 Ohm
Lead Channel Impedance Value: 530 Ohm
Lead Channel Pacing Threshold Amplitude: 0.625 V
Lead Channel Pacing Threshold Amplitude: 0.75 V
Lead Channel Pacing Threshold Pulse Width: 0.4 ms
Lead Channel Pacing Threshold Pulse Width: 0.4 ms
Lead Channel Sensing Intrinsic Amplitude: 11.2 mV
Lead Channel Setting Pacing Amplitude: 2 V
Lead Channel Setting Pacing Amplitude: 2.5 V
Lead Channel Setting Pacing Pulse Width: 0.4 ms
Lead Channel Setting Sensing Sensitivity: 5.6 mV

## 2014-06-13 NOTE — Progress Notes (Signed)
Remote pacemaker transmission.   

## 2014-06-15 DIAGNOSIS — M6281 Muscle weakness (generalized): Secondary | ICD-10-CM | POA: Diagnosis not present

## 2014-06-15 DIAGNOSIS — M25511 Pain in right shoulder: Secondary | ICD-10-CM | POA: Diagnosis not present

## 2014-06-22 DIAGNOSIS — M4716 Other spondylosis with myelopathy, lumbar region: Secondary | ICD-10-CM | POA: Diagnosis not present

## 2014-06-22 DIAGNOSIS — M545 Low back pain: Secondary | ICD-10-CM | POA: Diagnosis not present

## 2014-06-22 DIAGNOSIS — M6281 Muscle weakness (generalized): Secondary | ICD-10-CM | POA: Diagnosis not present

## 2014-06-27 ENCOUNTER — Encounter: Payer: Self-pay | Admitting: Cardiology

## 2014-06-30 DIAGNOSIS — H3531 Nonexudative age-related macular degeneration: Secondary | ICD-10-CM | POA: Diagnosis not present

## 2014-07-01 ENCOUNTER — Encounter (HOSPITAL_BASED_OUTPATIENT_CLINIC_OR_DEPARTMENT_OTHER): Payer: Medicare Other

## 2014-07-03 ENCOUNTER — Ambulatory Visit (HOSPITAL_BASED_OUTPATIENT_CLINIC_OR_DEPARTMENT_OTHER): Payer: Medicare Other | Attending: Pulmonary Disease | Admitting: Radiology

## 2014-07-03 VITALS — Ht 68.0 in | Wt 170.0 lb

## 2014-07-03 DIAGNOSIS — Z7982 Long term (current) use of aspirin: Secondary | ICD-10-CM | POA: Diagnosis not present

## 2014-07-03 DIAGNOSIS — G4710 Hypersomnia, unspecified: Secondary | ICD-10-CM | POA: Insufficient documentation

## 2014-07-03 DIAGNOSIS — Z6825 Body mass index (BMI) 25.0-25.9, adult: Secondary | ICD-10-CM | POA: Insufficient documentation

## 2014-07-03 DIAGNOSIS — Z79899 Other long term (current) drug therapy: Secondary | ICD-10-CM | POA: Insufficient documentation

## 2014-07-03 DIAGNOSIS — G4733 Obstructive sleep apnea (adult) (pediatric): Secondary | ICD-10-CM | POA: Insufficient documentation

## 2014-07-03 DIAGNOSIS — R0683 Snoring: Secondary | ICD-10-CM | POA: Insufficient documentation

## 2014-07-04 ENCOUNTER — Encounter: Payer: Self-pay | Admitting: Internal Medicine

## 2014-07-06 ENCOUNTER — Telehealth: Payer: Self-pay | Admitting: Pulmonary Disease

## 2014-07-06 DIAGNOSIS — G4733 Obstructive sleep apnea (adult) (pediatric): Secondary | ICD-10-CM

## 2014-07-06 NOTE — Sleep Study (Signed)
[place]  NAME: Brittany [name] DATE OF BIRTH:  March 21, 1934 MEDICAL RECORD NUMBER [id number]  LOCATION: [facility]  PHYSICIAN: Chesley Mires, M.D. DATE OF STUDY: 07/03/2014  SLEEP STUDY TYPE: Split night protocol               REFERRING PHYSICIAN: Chesley Mires, MD  INDICATION FOR STUDY:  Brittany [name] is a 80 y.o. female who presents to the sleep lab for evaluation of hypersomnia with obstructive sleep apnea.  She reports snoring, sleep disruption, apnea, and daytime sleepiness.  She has prior history of sleep apnea and had done well with CPAP therapy.  She is required to have repeat sleep study for insurance coverage of therapy for Obstructive Sleep Apnea.  EPWORTH SLEEPINESS SCORE: 1. HEIGHT: 5\' 8"  (172.7 cm)  WEIGHT: 170 lb (77.111 kg)    Body mass index is 25.85 kg/(m^2).  NECK SIZE: 15 in.  MEDICATIONS:  Current Outpatient Prescriptions on File Prior to Visit  Medication Sig Dispense Refill  . amLODipine (NORVASC) 5 MG tablet Take 1 tablet (5 mg total) by mouth daily. 90 tablet 1  . aspirin 81 MG tablet Take 81 mg by mouth daily.    . B Complex-C (B-COMPLEX WITH VITAMIN C) tablet Take 1 tablet by mouth daily.    . benazepril-hydrochlorthiazide (LOTENSIN HCT) 20-12.5 MG per tablet Take 0.5 tablets by mouth daily. 90 tablet 1  . citalopram (CELEXA) 20 MG tablet Take 20 mg by mouth daily.    Marland Kitchen desmopressin (DDAVP) 0.2 MG tablet Take 1 tablet (0.2 mg total) by mouth daily. 90 tablet 1  . EPINEPHrine 0.3 mg/0.3 mL IJ SOAJ injection Inject 0.3 mLs (0.3 mg total) into the muscle once. 1 Device 1  . levothyroxine (SYNTHROID, LEVOTHROID) 25 MCG tablet Take 1 tablet (25 mcg total) by mouth daily before breakfast. 90 tablet 2  . Lutein-Zeaxanthin 25-5 MG CAPS Take by mouth daily.    . magnesium oxide (MAG-OX) 400 MG tablet Take 1 tablet (400 mg total) by mouth daily. 30 tablet 0  . Misc Natural Products (GLUCOSAMINE CHONDROITIN MSM PO) Take 1,500 mg  by mouth 2 (two) times daily.    . Multiple Vitamin (MULTIVITAMIN WITH MINERALS) TABS tablet Take 1 tablet by mouth daily.    . Omega-3 Fatty Acids (FISH OIL) 1200 MG CAPS Take 1 capsule by mouth 2 (two) times daily.    Marland Kitchen omeprazole (PRILOSEC) 20 MG capsule Take 1 capsule (20 mg total) by mouth daily. 90 capsule 1  . solifenacin (VESICARE) 10 MG tablet Take 1 tablet (10 mg total) by mouth daily. 90 tablet 0  . sotalol (BETAPACE) 80 MG tablet TAKE ONE AND ONE-HALF TABLETS (120 MG TOTAL) BY MOUTH TWICE DAILY 90 tablet 6   No current facility-administered medications on file prior to visit.    SLEEP ARCHITECTURE:  Diagnostic portion: Total recording time: 161.5 minutes.  Total sleep time was: 125.5 minutes.  Sleep efficiency: 77.7%.  Sleep latency: 5.5 minutes.  REM latency: N/A minutes.  Stage N1: 9.6%.  Stage N2: 74.9%.  Stage N3: 15.5%.  Stage R:  N/A%.  Supine sleep: 31.5 minutes.  Non-supine sleep: 94 minutes.  Titration portion: Total recording time: 240.5 minutes.  Total sleep time was: 165.5 minutes.  Sleep efficiency: 68.8%.  Sleep latency: 0 minutes.  REM latency: N/A minutes.  Stage N1: 4.5%.  Stage N2: 53.2%.  Stage N3: 42.3%.  Stage R:  N/A%.  Supine sleep: 121.5 minutes.  Non-supine sleep: 44 minutes.  CARDIAC DATA:  Average heart rate: 64 beats per minute. Rhythm strip: paced rhythm.  RESPIRATORY DATA: Average respiratory rate: 18. Snoring: mild.  Diagnostic portion: Average AHI: 24.4.   Apnea index: 15.8.  Hypopnea index: 8.6. Obstructive apnea index: 15.8.  Central apnea index: 0.  Mixed apnea index: 0. REM AHI: N/A.  NREM AHI: 15.8. Supine AHI: 38.1. Non-supine AHI: 19.8.  Titration portion: She was started on CPAP 5 and increased to CPAP 8 cm H2O.  With CPAP at 8 cm H2O her AHI was reduced to 0, and she was observed in supine sleep at this pressure setting.  MOVEMENT/PARASOMNIA:  Diagnostic portion: Periodic limb movement: 81.8.  Period limb movements with  arousals: 8.1.  Titration portion: Periodic limb movement: 122.5.  Period limb movements with arousals: 14.1.  Restroom trips: 5.  OXYGEN DATA:  Baseline oxygenation: 94%. Lowest SaO2: 76%. Time spent below SaO2 90%: 11.7 minutes. Supplemental oxygen used: none.  IMPRESSION/ RECOMMENDATION:   This study shows moderate obstructive sleep apnea with an AHI of 24.4 and SaO2 low of 76%.  She did well with CPAP 8 cm H2O.  She was fitted with a large Respironics wisp mask.  She had an increase in her periodic limb movement index.  Correlate clinically whether additional therapy is needed for this.   Chesley Mires, M.D. Diplomate, Tax adviser of Sleep Medicine  ELECTRONICALLY SIGNED ON:  07/06/2014, 8:05 AM [place] PH: [phone]   FX: [phone] [place]

## 2014-07-06 NOTE — Telephone Encounter (Signed)
PSG 07/03/14 >> AHI 24.4, SaO2 low 76%, PLMI 81.8.  CPAP 8 cm H2O >> AHI 0, +S.   Will have my nurse inform pt that sleep study shows moderate sleep apnea.  She did well with CPAP 8 cm H2O.    Please arrange for pt to get CPAP 8 cm H2O with heated humidity and mask of choice.  Please have her get scheduled for ROV 2 months after CPAP set up.

## 2014-07-08 NOTE — Telephone Encounter (Signed)
Results have been explained to patient, pt expressed understanding. Pt states that she already has a CPAP and it is set on pressure 8. Nothing needs to be done.  Pt states that she needs documentation sent to her insurance company stating that she is tolerating CPAP and needs CPAP. Pt scheduled for OV with VS 09/12/13 @ 2pm. Nothing further needed.

## 2014-07-08 NOTE — Telephone Encounter (Signed)
LMTCB x 1 Pt needs results and 2 month appt with VS(f/u CPAP)

## 2014-07-08 NOTE — Telephone Encounter (Signed)
Pt returning call.Brittany [name] ° °

## 2014-07-11 ENCOUNTER — Encounter: Payer: Self-pay | Admitting: Nurse Practitioner

## 2014-07-11 ENCOUNTER — Ambulatory Visit (INDEPENDENT_AMBULATORY_CARE_PROVIDER_SITE_OTHER): Payer: Medicare Other | Admitting: Nurse Practitioner

## 2014-07-11 ENCOUNTER — Telehealth: Payer: Self-pay | Admitting: Nurse Practitioner

## 2014-07-11 ENCOUNTER — Other Ambulatory Visit: Payer: Self-pay | Admitting: *Deleted

## 2014-07-11 VITALS — BP 172/89 | HR 69 | Temp 97.8°F | Ht 66.0 in | Wt 179.0 lb

## 2014-07-11 DIAGNOSIS — K2970 Gastritis, unspecified, without bleeding: Secondary | ICD-10-CM

## 2014-07-11 DIAGNOSIS — M25511 Pain in right shoulder: Secondary | ICD-10-CM | POA: Diagnosis not present

## 2014-07-11 MED ORDER — OMEPRAZOLE 20 MG PO CPDR: 20.0000 mg | DELAYED_RELEASE_CAPSULE | Freq: Every day | ORAL | Status: DC

## 2014-07-11 MED ORDER — OMEPRAZOLE 40 MG PO CPDR: 40.0000 mg | DELAYED_RELEASE_CAPSULE | Freq: Every day | ORAL | Status: DC

## 2014-07-11 NOTE — Progress Notes (Signed)
Pre visit review using our clinic review tool, if applicable. No additional management support is needed unless otherwise documented below in the visit note. 

## 2014-07-11 NOTE — Patient Instructions (Signed)
Please see orthopedist regarding R shoulder pain.  Continue with omeprazole 20 mg daily. Take magnesium supplement daily: 250 mg.  See you in February.  Merry Christmas!

## 2014-07-11 NOTE — Telephone Encounter (Signed)
Please advise referral. Rx printed.

## 2014-07-11 NOTE — Telephone Encounter (Signed)
Please mail Rx to patient's house for Omeprazol 90 day supply with refills. Patient is also requesting an ortho referral for her right shoulder, the PT has not helped it.

## 2014-07-11 NOTE — Telephone Encounter (Signed)
pls call pt:Pls schedule OV for referral to ortho.

## 2014-07-11 NOTE — Telephone Encounter (Signed)
Patient returned call and scheduled ov.

## 2014-07-11 NOTE — Telephone Encounter (Signed)
Left message with pt's husband for patient to return call.

## 2014-07-12 NOTE — Progress Notes (Signed)
Subjective:    Brittany [name] is a 80 y.o. female who presents with right shoulder pain. The symptoms began 11 weeks ago. Pain started after she fell in parking lot. Shoulder, elbow, & wrist xrays were performed revealing no fracture or displacement. Pt suffered significant elbow contusion & abrasion that resolved w/conservative measures. She has completed several weeks of PT for persistent shoulder pain & decreased ROM. She has incomplete relief: still c/o occasional mild pain w/posterior extension & incomplete overhead ROM. Discomfort is described as aching.  The following portions of the patient's history were reviewed and updated as appropriate: allergies, current medications, past medical history, past social history, past surgical history and problem list. Pt wants to discuss med for reflux: saw a commercial that indicated she needs to be on high dose omeprazole, given her hx of esophageal strictures. She denies symptoms of reflux. Never has to take OTC meds for "breakthrough" symptoms. I reviewed upper endoscopy report: "mild nonspecific gastritis" and narrowed area in esoph-GI doc thought this was external to esoph although CT scan did not correlate endo findings. Advised pt to remain on low dose as she is asymptomatic. Discussed potential complications of high dose: compromised ability to extract nutrients from food leading to nutritional deficiiencies.   Review of Systems Constitutional: negative for weight loss Gastrointestinal: negative for abdominal pain, change in bowel habits, dysphagia, nausea, odynophagia, reflux symptoms and vomiting Musculoskeletal:negative for muscle weakness  Throat: denies sore throat, hoarseness, voice change Objective:    BP 172/89 mmHg  Pulse 69  Temp(Src) 97.8 F (36.6 C) (Temporal)  Ht 5\' 6"  (1.676 m)  Wt 179 lb (81.194 kg)  BMI 28.91 kg/m2  SpO2 98%  LMP 07/22/1968 Right shoulder: pain with palpation at posterior glenohumeral joint. pos scarf sign,  decreased overhead extension-only lacks about 30 degrees, painful active posterior extension   Left shoulder: normal active ROM, no tenderness, no impingement sign     Assessment:Plan    1. Pain in joint, shoulder region, right Chronic, persistent, incomplete resolve of symptoms - Ambulatory referral to Orthopedics  2. Gastritis Stable, chronic, continue current meds - omeprazole (PRILOSEC) 20 MG capsule; Take 1 capsule (20 mg total) by mouth daily.  Dispense: 90 capsule; Refill: 1  Keep Feb appt. To review chronic conditions

## 2014-07-12 NOTE — Assessment & Plan Note (Signed)
Incomplete resolve of R shoulder pain secondary to fall Several weeks PT Ref to ortho for further eval of ongoing pain Not candidate for MRI-pacemaker

## 2014-07-13 ENCOUNTER — Ambulatory Visit: Payer: Medicare Other | Admitting: Nurse Practitioner

## 2014-07-21 ENCOUNTER — Telehealth: Payer: Self-pay | Admitting: Nurse Practitioner

## 2014-07-21 NOTE — Telephone Encounter (Signed)
Patient wants to know if she can take a different bp med, the Benazepril has gone up to $200 for a 90 day supply. She has a week & half of her medication left. Patient uses [facility] in [place].

## 2014-07-21 NOTE — Telephone Encounter (Signed)
Please advise 

## 2014-07-22 DIAGNOSIS — S82899A Other fracture of unspecified lower leg, initial encounter for closed fracture: Secondary | ICD-10-CM

## 2014-07-22 HISTORY — DX: Other fracture of unspecified lower leg, initial encounter for closed fracture: S82.899A

## 2014-07-25 ENCOUNTER — Other Ambulatory Visit: Payer: Self-pay | Admitting: Nurse Practitioner

## 2014-07-25 DIAGNOSIS — I10 Essential (primary) hypertension: Secondary | ICD-10-CM

## 2014-07-25 MED ORDER — LISINOPRIL-HYDROCHLOROTHIAZIDE 20-12.5 MG PO TABS: 1.0000 | ORAL_TABLET | Freq: Every day | ORAL | Status: DC

## 2014-07-25 NOTE — Telephone Encounter (Signed)
Done. Patient notified.

## 2014-07-25 NOTE — Progress Notes (Signed)
Patient notified

## 2014-07-27 DIAGNOSIS — M75121 Complete rotator cuff tear or rupture of right shoulder, not specified as traumatic: Secondary | ICD-10-CM | POA: Diagnosis not present

## 2014-07-27 DIAGNOSIS — M25511 Pain in right shoulder: Secondary | ICD-10-CM | POA: Diagnosis not present

## 2014-07-28 ENCOUNTER — Other Ambulatory Visit: Payer: Self-pay

## 2014-07-28 ENCOUNTER — Other Ambulatory Visit: Payer: Self-pay | Admitting: Orthopedic Surgery

## 2014-07-28 DIAGNOSIS — M25511 Pain in right shoulder: Secondary | ICD-10-CM

## 2014-07-28 MED ORDER — CITALOPRAM HYDROBROMIDE 20 MG PO TABS: 20.0000 mg | ORAL_TABLET | Freq: Every day | ORAL | Status: DC

## 2014-07-28 NOTE — Telephone Encounter (Signed)
[facility] requesting refill on citalopram.

## 2014-08-03 ENCOUNTER — Ambulatory Visit
Admission: RE | Admit: 2014-08-03 | Discharge: 2014-08-03 | Disposition: A | Discharge status: Home / Self Care | Payer: Medicare Other | Source: Ambulatory Visit | Attending: Orthopedic Surgery | Admitting: Orthopedic Surgery

## 2014-08-03 DIAGNOSIS — M25511 Pain in right shoulder: Secondary | ICD-10-CM

## 2014-08-03 DIAGNOSIS — M75101 Unspecified rotator cuff tear or rupture of right shoulder, not specified as traumatic: Secondary | ICD-10-CM | POA: Diagnosis not present

## 2014-08-03 MED ORDER — IOHEXOL 180 MG/ML  SOLN
15.0000 mL | Freq: Once | INTRAMUSCULAR | Status: AC | PRN
  Administered 2014-08-03: 12 mL via INTRA_ARTICULAR

## 2014-08-04 ENCOUNTER — Telehealth: Payer: Self-pay | Admitting: Pulmonary Disease

## 2014-08-04 DIAGNOSIS — G4733 Obstructive sleep apnea (adult) (pediatric): Secondary | ICD-10-CM

## 2014-08-04 NOTE — Telephone Encounter (Signed)
Per 07/07/15 phone note: Brittany Mires, MD at 07/06/2014 8:13 AM     Status: Signed       Expand All Collapse All   PSG 07/03/14 >> AHI 24.4, SaO2 low 76%, PLMI 81.8. CPAP 8 cm H2O >> AHI 0, +S.   Will have my nurse inform pt that sleep study shows moderate sleep apnea.  She did well with CPAP 8 cm H2O.   Please arrange for pt to get CPAP 8 cm H2O with heated humidity and mask of choice. Please have her get scheduled for ROV 2 months after CPAP set up.    --  Called spoke with pt. She is wanting to switch DME's. She is currently with [facility] and says they are terrible. Pt is not sure who she should switch to.  She wants order sent to DME Dr. Halford Chessman recommends. Please advise Dr. Halford Chessman thanks

## 2014-08-05 NOTE — Telephone Encounter (Signed)
Brittany [name]

## 2014-08-05 NOTE — Telephone Encounter (Signed)
ATC pt x 3 line would ring then d/c. wcb

## 2014-08-08 NOTE — Telephone Encounter (Signed)
Spoke with pt this morning and she is agreeable to send the Order to [facility].  Pt states she will be out of town from 08/11/14 to 08/22/14 & I made note of this on the Order.  She is aware that it normally takes 3-5 business days for DME to get paperwork done.  Order has been faxed to [facility] with records.  Kara Mead

## 2014-08-08 NOTE — Telephone Encounter (Signed)
Pt states she currently uses CPAP through [facility] and wants to switch DME as she is not happy with services from [facility]. Pt is aware that I will send message and order to [facility] to work on this and seek another company for her based on her insurance is able.   [facility] please advise if patient can switch DME and which one she will go through now. Thanks.

## 2014-08-10 DIAGNOSIS — M75121 Complete rotator cuff tear or rupture of right shoulder, not specified as traumatic: Secondary | ICD-10-CM | POA: Diagnosis not present

## 2014-08-10 DIAGNOSIS — M25511 Pain in right shoulder: Secondary | ICD-10-CM | POA: Diagnosis not present

## 2014-08-16 ENCOUNTER — Telehealth: Payer: Self-pay | Admitting: Internal Medicine

## 2014-08-16 NOTE — Telephone Encounter (Signed)
New Msg      Request for surgical clearance:  1. What type of surgery is being performed? Outpatient rotator cuff tear repair  2. When is this surgery scheduled? 08/30/13  3. Are there any medications that need to be held prior to surgery and how long? The phys. Doesn't know but would like to know if any meds should be held.   4. Name of physician performing surgery? Dr. Meredith Pel  5. What is your office phone and fax number  Office [phone] fax [facility] calling, pt has upcoming surgery and fax hasn't been responded.  Kim requesting to be contacted today.

## 2014-08-16 NOTE — Telephone Encounter (Signed)
Informed Kim at [facility] that I received fax today and will have Dr. Caryl Comes review by end of the week. She verbalized understanding.

## 2014-08-19 ENCOUNTER — Other Ambulatory Visit (HOSPITAL_COMMUNITY): Payer: Self-pay | Admitting: Orthopedic Surgery

## 2014-08-19 ENCOUNTER — Ambulatory Visit: Payer: Medicare Other | Admitting: Nurse Practitioner

## 2014-08-19 NOTE — Telephone Encounter (Signed)
Faxed clearance.

## 2014-08-22 ENCOUNTER — Encounter (HOSPITAL_COMMUNITY): Payer: Self-pay

## 2014-08-22 ENCOUNTER — Encounter (HOSPITAL_COMMUNITY)
Admission: RE | Admit: 2014-08-22 | Discharge: 2014-08-22 | Disposition: A | Discharge status: Home / Self Care | Payer: Medicare Other | Source: Ambulatory Visit | Attending: Orthopedic Surgery | Admitting: Orthopedic Surgery

## 2014-08-22 DIAGNOSIS — I10 Essential (primary) hypertension: Secondary | ICD-10-CM | POA: Insufficient documentation

## 2014-08-22 DIAGNOSIS — G4733 Obstructive sleep apnea (adult) (pediatric): Secondary | ICD-10-CM | POA: Diagnosis not present

## 2014-08-22 DIAGNOSIS — Z01812 Encounter for preprocedural laboratory examination: Secondary | ICD-10-CM | POA: Diagnosis not present

## 2014-08-22 DIAGNOSIS — Z79899 Other long term (current) drug therapy: Secondary | ICD-10-CM | POA: Diagnosis not present

## 2014-08-22 DIAGNOSIS — Z950 Presence of cardiac pacemaker: Secondary | ICD-10-CM | POA: Diagnosis not present

## 2014-08-22 DIAGNOSIS — E039 Hypothyroidism, unspecified: Secondary | ICD-10-CM | POA: Diagnosis not present

## 2014-08-22 DIAGNOSIS — Z87891 Personal history of nicotine dependence: Secondary | ICD-10-CM | POA: Insufficient documentation

## 2014-08-22 DIAGNOSIS — M75101 Unspecified rotator cuff tear or rupture of right shoulder, not specified as traumatic: Secondary | ICD-10-CM | POA: Insufficient documentation

## 2014-08-22 DIAGNOSIS — I4891 Unspecified atrial fibrillation: Secondary | ICD-10-CM | POA: Diagnosis not present

## 2014-08-22 HISTORY — DX: Unspecified macular degeneration: H35.30

## 2014-08-22 HISTORY — DX: Personal history of colon polyps, unspecified: Z86.0100

## 2014-08-22 HISTORY — DX: Personal history of colonic polyps: Z86.010

## 2014-08-22 LAB — BASIC METABOLIC PANEL
Anion gap: 8 (ref 5–15)
BUN: 11 mg/dL (ref 6–23)
CO2: 29 mmol/L (ref 19–32)
Calcium: 10.5 mg/dL (ref 8.4–10.5)
Chloride: 99 mmol/L (ref 96–112)
Creatinine, Ser: 0.56 mg/dL (ref 0.50–1.10)
GFR calc Af Amer: >90 mL/min (ref >90)
GFR calc non Af Amer: 87 mL/min — ABNORMAL LOW (ref >90)
Glucose, Bld: 102 mg/dL — ABNORMAL HIGH (ref 70–99)
Potassium: 4 mmol/L (ref 3.5–5.1)
Sodium: 136 mmol/L (ref 135–145)

## 2014-08-22 LAB — CBC
HCT: 38.7 % (ref 36.0–46.0)
Hemoglobin: 13 g/dL (ref 12.0–15.0)
MCH: 30.7 pg (ref 26.0–34.0)
MCHC: 33.6 g/dL (ref 30.0–36.0)
MCV: 91.5 fL (ref 78.0–100.0)
Platelets: 300 10*3/uL (ref 150–400)
RBC: 4.23 MIL/uL (ref 3.87–5.11)
RDW: 14.6 % (ref 11.5–15.5)
WBC: 5.9 10*3/uL (ref 4.0–10.5)

## 2014-08-22 MED ORDER — CHLORHEXIDINE GLUCONATE 4 % EX LIQD: 60.0000 mL | Freq: Once | CUTANEOUS | Status: DC

## 2014-08-22 NOTE — Progress Notes (Addendum)
Stress test in epic from 2012  EKG in epic from 03-09-14  CXR in epic from 12-29-13  Sleep study in epic from 2014/2015  Cardiologist is Dr.Klein and last visit was about 45months ago  Medical Md is with   [facility] in [facility]               But sees.Nicky Pugh NP

## 2014-08-22 NOTE — Progress Notes (Signed)
Spoke with Tomi Bamberger and notified her of what the form came back saying from [facility]. Surgery scheduled for 08/30/14 @ 12.

## 2014-08-22 NOTE — Pre-Procedure Instructions (Signed)
Brittany [name]  08/22/2014   Your procedure is scheduled on:  Tues, Feb 9 @ 12:00 PM  Report to [facility]  at 10:00 AM.  Call this number if you have problems the morning of surgery: [phone]   Remember:   Do not eat food or drink liquids after midnight.   Take these medicines the morning of surgery with A SIP OF WATER: Amlodipine(Norvasc),Celexa(Citalopram),Synthroid(Levothyroxine),Vesicare(Solifenacin),Betapace(Sotalol),and Omeprazole(Prilosec)               Stop taking your Fish Oil,Aspirin,Vitamins,and Herbal Medications. No Goody's,BC's,Aleve,or Ibuprofen.   Do not wear jewelry, make-up or nail polish.  Do not wear lotions, powders, or perfumes.  Do not shave 48 hours prior to surgery.   Do not bring valuables to the hospital.  [facility] is not responsible                  for any belongings or valuables.               Contacts, dentures or bridgework may not be worn into surgery.  Leave suitcase in the car. After surgery it may be brought to your room.  For patients admitted to the hospital, discharge time is determined by your                treatment team.               Patients discharged the day of surgery will not be allowed to drive  home.    Special Instructions:  [place] - Preparing for Surgery  Before surgery, you can play an important role.  Because skin is not sterile, your skin needs to be as free of germs as possible.  You can reduce the number of germs on you skin by washing with CHG (chlorahexidine gluconate) soap before surgery.  CHG is an antiseptic cleaner which kills germs and bonds with the skin to continue killing germs even after washing.  Please DO NOT use if you have an allergy to CHG or antibacterial soaps.  If your skin becomes reddened/irritated stop using the CHG and inform your nurse when you arrive at Short Stay.  Do not shave (including legs and underarms) for at least 48 hours prior to the first CHG shower.  You may shave your  face.  Please follow these instructions carefully:   1.  Shower with CHG Soap the night before surgery and the                                morning of Surgery.  2.  If you choose to wash your hair, wash your hair first as usual with your       normal shampoo.  3.  After you shampoo, rinse your hair and body thoroughly to remove the                      Shampoo.  4.  Use CHG as you would any other liquid soap.  You can apply chg directly       to the skin and wash gently with scrungie or a clean washcloth.  5.  Apply the CHG Soap to your body ONLY FROM THE NECK DOWN.        Do not use on open wounds or open sores.  Avoid contact with your eyes,       ears, mouth and genitals (private parts).  Wash genitals (private parts)       with your normal soap.  6.  Wash thoroughly, paying special attention to the area where your surgery        will be performed.  7.  Thoroughly rinse your body with warm water from the neck down.  8.  DO NOT shower/wash with your normal soap after using and rinsing off       the CHG Soap.  9.  Pat yourself dry with a clean towel.            10.  Wear clean pajamas.            11.  Place clean sheets on your bed the night of your first shower and do not        sleep with pets.  Day of Surgery  Do not apply any lotions/deoderants the morning of surgery.  Please wear clean clothes to the hospital/surgery center.     Please read over the following fact sheets that you were given: Pain Booklet, Coughing and Deep Breathing and Surgical Site Infection Prevention

## 2014-08-23 NOTE — Progress Notes (Signed)
Anesthesia Chart Review:  Pt is 81 year old female scheduled for R shoulder arthroscopy with mini-open rotator cuff repair and subacromial decompression on 08/30/2014 with Dr. Marlou Sa.   Cardiologist is Dr. Caryl Comes, last visit 02/2014.   PMH includes: HTN, atrial fibrillation, sinus node dysfunction, pacemaker (Medtronic), OSA, hypothyroidism. Former smoker.   Medications include: ASA, DDAVP, lisinopril-hctz, amlodipine, sotalol  Preoperative labs reviewed.    EKG 03/09/2014: Electronic atrial pacemaker. Cannot rule out anterior infarct, age undetermined.   Nuclear stress test 03/22/2011: 1. No scintigraphic evidence of reversible ischemia.  2. Estimated EF is 74%  Perioperative pacemaker prescription received from Dr. Caryl Comes. Date of last device check 06/13/14. Procedure may interfere with device function. Magnet should be placed over device during procedure.   Pt has cardiac clearance from Dr. Caryl Comes for shoulder surgery.   If no changes, I anticipate pt can proceed with surgery as scheduled.   Willeen Cass, FNP-BC [facility] Short Stay Surgical Center/Anesthesiology Phone: [phone] 08/23/2014 1:32 PM

## 2014-08-25 DIAGNOSIS — M4716 Other spondylosis with myelopathy, lumbar region: Secondary | ICD-10-CM | POA: Diagnosis not present

## 2014-08-25 DIAGNOSIS — M5442 Lumbago with sciatica, left side: Secondary | ICD-10-CM | POA: Diagnosis not present

## 2014-08-26 ENCOUNTER — Encounter: Payer: Self-pay | Admitting: Nurse Practitioner

## 2014-08-26 ENCOUNTER — Ambulatory Visit (INDEPENDENT_AMBULATORY_CARE_PROVIDER_SITE_OTHER): Payer: Medicare Other | Admitting: Nurse Practitioner

## 2014-08-26 VITALS — BP 120/72 | HR 68 | Temp 98.0°F | Ht 66.0 in | Wt 184.0 lb

## 2014-08-26 DIAGNOSIS — F329 Major depressive disorder, single episode, unspecified: Secondary | ICD-10-CM

## 2014-08-26 DIAGNOSIS — E039 Hypothyroidism, unspecified: Secondary | ICD-10-CM | POA: Diagnosis not present

## 2014-08-26 DIAGNOSIS — I10 Essential (primary) hypertension: Secondary | ICD-10-CM | POA: Diagnosis not present

## 2014-08-26 DIAGNOSIS — M25511 Pain in right shoulder: Secondary | ICD-10-CM

## 2014-08-26 DIAGNOSIS — E559 Vitamin D deficiency, unspecified: Secondary | ICD-10-CM

## 2014-08-26 DIAGNOSIS — N3941 Urge incontinence: Secondary | ICD-10-CM | POA: Diagnosis not present

## 2014-08-26 DIAGNOSIS — F32A Depression, unspecified: Secondary | ICD-10-CM

## 2014-08-26 LAB — MAGNESIUM: Magnesium: 1.7 mg/dL (ref 1.5–2.5)

## 2014-08-26 LAB — TSH: TSH: 2.16 u[IU]/mL (ref 0.35–4.50)

## 2014-08-26 LAB — VITAMIN D 25 HYDROXY (VIT D DEFICIENCY, FRACTURES): VITD: 26.4 ng/mL — ABNORMAL LOW (ref 30.00–100.00)

## 2014-08-26 MED ORDER — SOLIFENACIN SUCCINATE 10 MG PO TABS: 10.0000 mg | ORAL_TABLET | Freq: Every day | ORAL | Status: DC

## 2014-08-26 MED ORDER — LISINOPRIL-HYDROCHLOROTHIAZIDE 20-12.5 MG PO TABS: 1.0000 | ORAL_TABLET | Freq: Every day | ORAL | Status: DC

## 2014-08-26 MED ORDER — DESMOPRESSIN ACETATE 0.2 MG PO TABS: 0.2000 mg | ORAL_TABLET | Freq: Every day | ORAL | Status: DC

## 2014-08-26 NOTE — Assessment & Plan Note (Signed)
Saw ortho. Rotator cuff tear. Surgery planned next week.

## 2014-08-26 NOTE — Patient Instructions (Signed)
You look great!  I will be thinking about you next week.  Continue all meds.  Consider getting shingles vaccine.  I will see you in 6 months unless you need [place] sooner.

## 2014-08-26 NOTE — Assessment & Plan Note (Signed)
Pt states meds well. She takes together. Uses when going on trip or has outing. Continue DDAVP & vesicare.

## 2014-08-26 NOTE — Progress Notes (Signed)
Pre visit review using our clinic review tool, if applicable. No additional management support is needed unless otherwise documented below in the visit note. 

## 2014-08-26 NOTE — Assessment & Plan Note (Signed)
No s&s of hyper or hypothyroid TSH labs stable in past  No recent dose changes TSH today

## 2014-08-27 ENCOUNTER — Telehealth: Payer: Self-pay | Admitting: Nurse Practitioner

## 2014-08-27 NOTE — Telephone Encounter (Signed)
Vit d def Mg low nml TSH stable

## 2014-08-27 NOTE — Telephone Encounter (Signed)
pls call pt: Advise Vit d low. Looks like she is forgetting to take it. Needs 5000 iu qd with meal Mg & TSH are fine-keep taking magnesium

## 2014-08-29 MED ORDER — CEFAZOLIN SODIUM-DEXTROSE 2-3 GM-% IV SOLR
2.0000 g | INTRAVENOUS | Status: AC
  Administered 2014-08-30: 2 g via INTRAVENOUS
  Filled 2014-08-29: qty 50

## 2014-08-29 NOTE — Telephone Encounter (Signed)
Phone busy, will try again later.

## 2014-08-29 NOTE — Telephone Encounter (Signed)
Patient notified of results. Patient expressed understanding.  

## 2014-08-29 NOTE — Progress Notes (Signed)
Pt. Aware of time change for surgery , agrees to arrive tomorrow a.m. At 0830.

## 2014-08-30 ENCOUNTER — Observation Stay (HOSPITAL_COMMUNITY)
Admission: RE | Admit: 2014-08-30 | Discharge: 2014-08-31 | Disposition: A | Discharge status: Home / Self Care | Payer: Medicare Other | Source: Ambulatory Visit | Attending: Orthopedic Surgery | Admitting: Orthopedic Surgery

## 2014-08-30 ENCOUNTER — Encounter (HOSPITAL_COMMUNITY)
Admission: RE | Disposition: A | Discharge status: Home / Self Care | Payer: Self-pay | Source: Ambulatory Visit | Attending: Orthopedic Surgery

## 2014-08-30 ENCOUNTER — Encounter (HOSPITAL_COMMUNITY): Payer: Self-pay | Admitting: Anesthesiology

## 2014-08-30 ENCOUNTER — Ambulatory Visit (HOSPITAL_COMMUNITY): Payer: Medicare Other | Admitting: Emergency Medicine

## 2014-08-30 ENCOUNTER — Ambulatory Visit (HOSPITAL_COMMUNITY): Payer: Medicare Other | Admitting: Anesthesiology

## 2014-08-30 DIAGNOSIS — G4730 Sleep apnea, unspecified: Secondary | ICD-10-CM | POA: Insufficient documentation

## 2014-08-30 DIAGNOSIS — F329 Major depressive disorder, single episode, unspecified: Secondary | ICD-10-CM | POA: Insufficient documentation

## 2014-08-30 DIAGNOSIS — E039 Hypothyroidism, unspecified: Secondary | ICD-10-CM | POA: Insufficient documentation

## 2014-08-30 DIAGNOSIS — Z7982 Long term (current) use of aspirin: Secondary | ICD-10-CM | POA: Diagnosis not present

## 2014-08-30 DIAGNOSIS — M75100 Unspecified rotator cuff tear or rupture of unspecified shoulder, not specified as traumatic: Secondary | ICD-10-CM | POA: Diagnosis present

## 2014-08-30 DIAGNOSIS — K219 Gastro-esophageal reflux disease without esophagitis: Secondary | ICD-10-CM | POA: Diagnosis not present

## 2014-08-30 DIAGNOSIS — I4891 Unspecified atrial fibrillation: Secondary | ICD-10-CM | POA: Diagnosis not present

## 2014-08-30 DIAGNOSIS — M75121 Complete rotator cuff tear or rupture of right shoulder, not specified as traumatic: Secondary | ICD-10-CM | POA: Diagnosis not present

## 2014-08-30 DIAGNOSIS — Z950 Presence of cardiac pacemaker: Secondary | ICD-10-CM | POA: Diagnosis not present

## 2014-08-30 DIAGNOSIS — I10 Essential (primary) hypertension: Secondary | ICD-10-CM | POA: Diagnosis not present

## 2014-08-30 DIAGNOSIS — M1990 Unspecified osteoarthritis, unspecified site: Secondary | ICD-10-CM | POA: Insufficient documentation

## 2014-08-30 DIAGNOSIS — Z87891 Personal history of nicotine dependence: Secondary | ICD-10-CM | POA: Insufficient documentation

## 2014-08-30 DIAGNOSIS — M19011 Primary osteoarthritis, right shoulder: Secondary | ICD-10-CM | POA: Diagnosis not present

## 2014-08-30 DIAGNOSIS — M810 Age-related osteoporosis without current pathological fracture: Secondary | ICD-10-CM | POA: Diagnosis not present

## 2014-08-30 DIAGNOSIS — M75101 Unspecified rotator cuff tear or rupture of right shoulder, not specified as traumatic: Secondary | ICD-10-CM | POA: Diagnosis not present

## 2014-08-30 DIAGNOSIS — H3530 Unspecified macular degeneration: Secondary | ICD-10-CM | POA: Insufficient documentation

## 2014-08-30 DIAGNOSIS — Z79899 Other long term (current) drug therapy: Secondary | ICD-10-CM | POA: Diagnosis not present

## 2014-08-30 DIAGNOSIS — G8918 Other acute postprocedural pain: Secondary | ICD-10-CM | POA: Diagnosis not present

## 2014-08-30 HISTORY — PX: SHOULDER ARTHROSCOPY WITH ROTATOR CUFF REPAIR AND SUBACROMIAL DECOMPRESSION: SHX5686

## 2014-08-30 HISTORY — PX: SHOULDER ARTHROSCOPY W/ ROTATOR CUFF REPAIR: SHX2400

## 2014-08-30 HISTORY — DX: Dependence on other enabling machines and devices: Z99.89

## 2014-08-30 HISTORY — DX: Obstructive sleep apnea (adult) (pediatric): G47.33

## 2014-08-30 SURGERY — SHOULDER ARTHROSCOPY WITH ROTATOR CUFF REPAIR AND SUBACROMIAL DECOMPRESSION
Anesthesia: General | Site: Shoulder | Laterality: Right

## 2014-08-30 MED ORDER — ASPIRIN 325 MG PO TABS
325.0000 mg | ORAL_TABLET | Freq: Every day | ORAL | Status: DC
  Administered 2014-08-30: 325 mg via ORAL
  Filled 2014-08-30 (×2): qty 1

## 2014-08-30 MED ORDER — NEOSTIGMINE METHYLSULFATE 10 MG/10ML IV SOLN
INTRAVENOUS | Status: DC | PRN
  Administered 2014-08-30: 3 mg via INTRAVENOUS

## 2014-08-30 MED ORDER — ACETAMINOPHEN 325 MG PO TABS: 650.0000 mg | ORAL_TABLET | Freq: Four times a day (QID) | ORAL | Status: DC | PRN

## 2014-08-30 MED ORDER — MAGNESIUM OXIDE 400 MG PO TABS: 400.0000 mg | ORAL_TABLET | Freq: Every day | ORAL | Status: DC

## 2014-08-30 MED ORDER — METOCLOPRAMIDE HCL 10 MG PO TABS: 5.0000 mg | ORAL_TABLET | Freq: Three times a day (TID) | ORAL | Status: DC | PRN

## 2014-08-30 MED ORDER — GLYCOPYRROLATE 0.2 MG/ML IJ SOLN
INTRAMUSCULAR | Status: AC
  Filled 2014-08-30: qty 2

## 2014-08-30 MED ORDER — DARIFENACIN HYDROBROMIDE ER 7.5 MG PO TB24
7.5000 mg | ORAL_TABLET | Freq: Every day | ORAL | Status: DC
  Filled 2014-08-30: qty 1

## 2014-08-30 MED ORDER — PROMETHAZINE HCL 25 MG/ML IJ SOLN
6.2500 mg | INTRAMUSCULAR | Status: DC | PRN
Start: 2014-08-30 — End: 2014-08-30

## 2014-08-30 MED ORDER — ROPIVACAINE HCL 5 MG/ML IJ SOLN
INTRAMUSCULAR | Status: DC | PRN
  Administered 2014-08-30: 20 mL via PERINEURAL

## 2014-08-30 MED ORDER — ACETAMINOPHEN 650 MG RE SUPP: 650.0000 mg | Freq: Four times a day (QID) | RECTAL | Status: DC | PRN

## 2014-08-30 MED ORDER — PROPOFOL 10 MG/ML IV BOLUS
INTRAVENOUS | Status: AC
  Filled 2014-08-30: qty 20

## 2014-08-30 MED ORDER — LEVOTHYROXINE SODIUM 25 MCG PO TABS
25.0000 ug | ORAL_TABLET | Freq: Every day | ORAL | Status: DC
  Administered 2014-08-31: 25 ug via ORAL
  Filled 2014-08-30 (×2): qty 1

## 2014-08-30 MED ORDER — EPINEPHRINE 0.3 MG/0.3ML IJ SOAJ: 0.3000 mg | Freq: Once | INTRAMUSCULAR | Status: AC | PRN

## 2014-08-30 MED ORDER — ONDANSETRON HCL 4 MG/2ML IJ SOLN
4.0000 mg | Freq: Four times a day (QID) | INTRAMUSCULAR | Status: DC | PRN
  Administered 2014-08-30: 4 mg via INTRAVENOUS
  Filled 2014-08-30: qty 2

## 2014-08-30 MED ORDER — ONDANSETRON HCL 4 MG PO TABS: 4.0000 mg | ORAL_TABLET | Freq: Four times a day (QID) | ORAL | Status: DC | PRN

## 2014-08-30 MED ORDER — OXYCODONE HCL 5 MG PO TABS
5.0000 mg | ORAL_TABLET | Freq: Once | ORAL | Status: AC | PRN
  Administered 2014-08-30: 5 mg via ORAL

## 2014-08-30 MED ORDER — LISINOPRIL 20 MG PO TABS
20.0000 mg | ORAL_TABLET | Freq: Every day | ORAL | Status: DC
  Filled 2014-08-30: qty 1

## 2014-08-30 MED ORDER — PHENOL 1.4 % MT LIQD
1.0000 | OROMUCOSAL | Status: DC | PRN
  Filled 2014-08-30: qty 177

## 2014-08-30 MED ORDER — FENTANYL CITRATE 0.05 MG/ML IJ SOLN
INTRAMUSCULAR | Status: AC
  Filled 2014-08-30: qty 5

## 2014-08-30 MED ORDER — LISINOPRIL-HYDROCHLOROTHIAZIDE 20-12.5 MG PO TABS: 1.0000 | ORAL_TABLET | Freq: Every day | ORAL | Status: DC

## 2014-08-30 MED ORDER — SODIUM CHLORIDE 0.9 % IJ SOLN
INTRAMUSCULAR | Status: DC | PRN
  Administered 2014-08-30: 30 mL via INTRAVENOUS

## 2014-08-30 MED ORDER — SODIUM CHLORIDE 0.9 % IR SOLN
Status: DC | PRN
  Administered 2014-08-30: 6000 mL

## 2014-08-30 MED ORDER — MENTHOL 3 MG MT LOZG
1.0000 | LOZENGE | OROMUCOSAL | Status: DC | PRN
  Filled 2014-08-30: qty 9

## 2014-08-30 MED ORDER — CEFAZOLIN SODIUM 1-5 GM-% IV SOLN
1.0000 g | Freq: Three times a day (TID) | INTRAVENOUS | Status: AC
  Administered 2014-08-30 – 2014-08-31 (×2): 1 g via INTRAVENOUS
  Filled 2014-08-30 (×2): qty 50

## 2014-08-30 MED ORDER — KETOROLAC TROMETHAMINE 30 MG/ML IJ SOLN
30.0000 mg | Freq: Once | INTRAMUSCULAR | Status: AC | PRN
  Administered 2014-08-30: 30 mg via INTRAVENOUS

## 2014-08-30 MED ORDER — ROCURONIUM BROMIDE 50 MG/5ML IV SOLN
INTRAVENOUS | Status: AC
Start: 2014-08-30 — End: 2014-08-30
  Filled 2014-08-30: qty 1

## 2014-08-30 MED ORDER — LACTATED RINGERS IV SOLN
INTRAVENOUS | Status: DC
  Administered 2014-08-30: 50 mL/h via INTRAVENOUS

## 2014-08-30 MED ORDER — METHOCARBAMOL 500 MG PO TABS
ORAL_TABLET | ORAL | Status: AC
  Filled 2014-08-30: qty 1

## 2014-08-30 MED ORDER — HYDROCHLOROTHIAZIDE 12.5 MG PO CAPS
12.5000 mg | ORAL_CAPSULE | Freq: Every day | ORAL | Status: DC
  Filled 2014-08-30: qty 1

## 2014-08-30 MED ORDER — PROPOFOL 10 MG/ML IV BOLUS
INTRAVENOUS | Status: DC | PRN
  Administered 2014-08-30: 150 mg via INTRAVENOUS
  Administered 2014-08-30: 20 mg via INTRAVENOUS

## 2014-08-30 MED ORDER — ADULT MULTIVITAMIN W/MINERALS CH
1.0000 | ORAL_TABLET | Freq: Every day | ORAL | Status: DC
  Administered 2014-08-30: 1 via ORAL
  Filled 2014-08-30 (×2): qty 1

## 2014-08-30 MED ORDER — PANTOPRAZOLE SODIUM 40 MG PO TBEC: 40.0000 mg | DELAYED_RELEASE_TABLET | Freq: Every day | ORAL | Status: DC

## 2014-08-30 MED ORDER — HYDROCODONE-ACETAMINOPHEN 5-325 MG PO TABS
1.0000 | ORAL_TABLET | ORAL | Status: DC | PRN
  Administered 2014-08-30 – 2014-08-31 (×4): 2 via ORAL
  Filled 2014-08-30 (×3): qty 2

## 2014-08-30 MED ORDER — ETOMIDATE 2 MG/ML IV SOLN
INTRAVENOUS | Status: AC
  Filled 2014-08-30: qty 10

## 2014-08-30 MED ORDER — LIDOCAINE HCL (CARDIAC) 20 MG/ML IV SOLN
INTRAVENOUS | Status: AC
  Filled 2014-08-30: qty 5

## 2014-08-30 MED ORDER — FENTANYL CITRATE 0.05 MG/ML IJ SOLN
INTRAMUSCULAR | Status: AC
  Administered 2014-08-30: 100 ug
  Filled 2014-08-30: qty 2

## 2014-08-30 MED ORDER — METOCLOPRAMIDE HCL 5 MG/ML IJ SOLN
5.0000 mg | Freq: Three times a day (TID) | INTRAMUSCULAR | Status: DC | PRN
  Filled 2014-08-30: qty 2

## 2014-08-30 MED ORDER — MEPERIDINE HCL 25 MG/ML IJ SOLN: 6.2500 mg | INTRAMUSCULAR | Status: DC | PRN

## 2014-08-30 MED ORDER — ROCURONIUM BROMIDE 100 MG/10ML IV SOLN
INTRAVENOUS | Status: DC | PRN
  Administered 2014-08-30: 40 mg via INTRAVENOUS

## 2014-08-30 MED ORDER — KETOROLAC TROMETHAMINE 30 MG/ML IJ SOLN
INTRAMUSCULAR | Status: AC
  Filled 2014-08-30: qty 1

## 2014-08-30 MED ORDER — ONDANSETRON HCL 4 MG/2ML IJ SOLN
INTRAMUSCULAR | Status: DC | PRN
  Administered 2014-08-30: 4 mg via INTRAVENOUS

## 2014-08-30 MED ORDER — METHOCARBAMOL 500 MG PO TABS
500.0000 mg | ORAL_TABLET | Freq: Four times a day (QID) | ORAL | Status: DC | PRN
  Administered 2014-08-30: 500 mg via ORAL
  Filled 2014-08-30: qty 1

## 2014-08-30 MED ORDER — CITALOPRAM HYDROBROMIDE 20 MG PO TABS
20.0000 mg | ORAL_TABLET | Freq: Every day | ORAL | Status: DC
  Filled 2014-08-30: qty 1

## 2014-08-30 MED ORDER — PROPOFOL 10 MG/ML IV BOLUS: INTRAVENOUS | Status: DC | PRN

## 2014-08-30 MED ORDER — DESMOPRESSIN ACETATE 0.2 MG PO TABS
0.2000 mg | ORAL_TABLET | Freq: Every day | ORAL | Status: DC
  Filled 2014-08-30: qty 1

## 2014-08-30 MED ORDER — LIDOCAINE HCL (CARDIAC) 20 MG/ML IV SOLN
INTRAVENOUS | Status: DC | PRN
  Administered 2014-08-30: 50 mg via INTRAVENOUS

## 2014-08-30 MED ORDER — ROCURONIUM BROMIDE 100 MG/10ML IV SOLN
INTRAVENOUS | Status: DC | PRN
Start: 2014-08-30 — End: 2014-08-30

## 2014-08-30 MED ORDER — EPINEPHRINE HCL 1 MG/ML IJ SOLN
INTRAMUSCULAR | Status: AC
  Filled 2014-08-30: qty 1

## 2014-08-30 MED ORDER — LACTATED RINGERS IV SOLN
INTRAVENOUS | Status: DC | PRN
  Administered 2014-08-30 (×2): via INTRAVENOUS

## 2014-08-30 MED ORDER — EPINEPHRINE HCL 1 MG/ML IJ SOLN
INTRAMUSCULAR | Status: DC | PRN
  Administered 2014-08-30: 1 mg

## 2014-08-30 MED ORDER — PHENYLEPHRINE HCL 10 MG/ML IJ SOLN
10.0000 mg | INTRAVENOUS | Status: DC | PRN
  Administered 2014-08-30: 20 ug/min via INTRAVENOUS

## 2014-08-30 MED ORDER — OXYCODONE HCL 5 MG PO TABS
ORAL_TABLET | ORAL | Status: AC
  Filled 2014-08-30: qty 1

## 2014-08-30 MED ORDER — GLYCOPYRROLATE 0.2 MG/ML IJ SOLN
INTRAMUSCULAR | Status: DC | PRN
  Administered 2014-08-30: 0.4 mg via INTRAVENOUS

## 2014-08-30 MED ORDER — LIDOCAINE HCL (CARDIAC) 20 MG/ML IV SOLN: INTRAVENOUS | Status: DC | PRN

## 2014-08-30 MED ORDER — SOTALOL HCL 80 MG PO TABS
80.0000 mg | ORAL_TABLET | Freq: Every day | ORAL | Status: DC
  Filled 2014-08-30: qty 1

## 2014-08-30 MED ORDER — LUTEIN-ZEAXANTHIN 25-5 MG PO CAPS: ORAL_CAPSULE | Freq: Every day | ORAL | Status: DC

## 2014-08-30 MED ORDER — OXYCODONE HCL 5 MG/5ML PO SOLN: 5.0000 mg | Freq: Once | ORAL | Status: AC | PRN

## 2014-08-30 MED ORDER — MAGNESIUM OXIDE 400 (241.3 MG) MG PO TABS
400.0000 mg | ORAL_TABLET | Freq: Every day | ORAL | Status: DC
  Filled 2014-08-30: qty 1

## 2014-08-30 MED ORDER — METHOCARBAMOL 1000 MG/10ML IJ SOLN
500.0000 mg | Freq: Four times a day (QID) | INTRAVENOUS | Status: DC | PRN
  Filled 2014-08-30: qty 5

## 2014-08-30 MED ORDER — HYDROMORPHONE HCL 1 MG/ML IJ SOLN: 0.2500 mg | INTRAMUSCULAR | Status: DC | PRN

## 2014-08-30 MED ORDER — ONDANSETRON HCL 4 MG/2ML IJ SOLN
INTRAMUSCULAR | Status: AC
  Filled 2014-08-30: qty 2

## 2014-08-30 MED ORDER — NEOSTIGMINE METHYLSULFATE 10 MG/10ML IV SOLN
INTRAVENOUS | Status: AC
  Filled 2014-08-30: qty 1

## 2014-08-30 SURGICAL SUPPLY — 75 items
ANCHOR CORKSCREW BIO 5.5 FT (Anchor) ×4 IMPLANT
ANCHOR CORKSCREW BIO 6.5 W/SUT (Anchor) ×2 IMPLANT
BENZOIN TINCTURE PRP APPL 2/3 (GAUZE/BANDAGES/DRESSINGS) ×2 IMPLANT
BIT DRILL TAK (DRILL) IMPLANT
BLADE CUDA 5.5 (BLADE) IMPLANT
BLADE CUTTER GATOR 3.5 (BLADE) ×2 IMPLANT
BLADE GREAT WHITE 4.2 (BLADE) ×2 IMPLANT
BLADE SURG 11 STRL SS (BLADE) ×2 IMPLANT
BUR GATOR 2.9 (BURR) IMPLANT
BUR OVAL 6.0 (BURR) ×2 IMPLANT
CANNULA SHOULDER 7CM (CANNULA) IMPLANT
CARTRIDGE CURVETEK MED (MISCELLANEOUS) IMPLANT
CARTRIDGE CURVETEK XLRG (MISCELLANEOUS) IMPLANT
CLSR STERI-STRIP ANTIMIC 1/2X4 (GAUZE/BANDAGES/DRESSINGS) ×2 IMPLANT
COVER SURGICAL LIGHT HANDLE (MISCELLANEOUS) ×2 IMPLANT
DRAPE INCISE IOBAN 66X45 STRL (DRAPES) ×4 IMPLANT
DRAPE STERI 35X30 U-POUCH (DRAPES) ×2 IMPLANT
DRAPE U-SHAPE 47X51 STRL (DRAPES) ×4 IMPLANT
DRILL TAK (DRILL)
DRSG PAD ABDOMINAL 8X10 ST (GAUZE/BANDAGES/DRESSINGS) ×2 IMPLANT
DURAPREP 26ML APPLICATOR (WOUND CARE) ×2 IMPLANT
ELECT MENISCUS 165MM 90D (ELECTRODE) IMPLANT
ELECT REM PT RETURN 9FT ADLT (ELECTROSURGICAL) ×2
ELECTRODE REM PT RTRN 9FT ADLT (ELECTROSURGICAL) ×1 IMPLANT
FILTER STRAW FLUID ASPIR (MISCELLANEOUS) ×2 IMPLANT
GAUZE SPONGE 4X4 12PLY STRL (GAUZE/BANDAGES/DRESSINGS) ×2 IMPLANT
GAUZE XEROFORM 1X8 LF (GAUZE/BANDAGES/DRESSINGS) ×2 IMPLANT
GLOVE BIOGEL PI IND STRL 7.5 (GLOVE) ×1 IMPLANT
GLOVE BIOGEL PI IND STRL 8 (GLOVE) ×1 IMPLANT
GLOVE BIOGEL PI INDICATOR 7.5 (GLOVE) ×1
GLOVE BIOGEL PI INDICATOR 8 (GLOVE) ×1
GLOVE ECLIPSE 7.0 STRL STRAW (GLOVE) ×2 IMPLANT
GLOVE SURG ORTHO 8.0 STRL STRW (GLOVE) ×2 IMPLANT
GOWN STRL REUS W/ TWL LRG LVL3 (GOWN DISPOSABLE) IMPLANT
GOWN STRL REUS W/ TWL XL LVL3 (GOWN DISPOSABLE) ×2 IMPLANT
GOWN STRL REUS W/TWL LRG LVL3 (GOWN DISPOSABLE)
GOWN STRL REUS W/TWL XL LVL3 (GOWN DISPOSABLE) ×2
KIT BASIN OR (CUSTOM PROCEDURE TRAY) ×2 IMPLANT
KIT ROOM TURNOVER OR (KITS) ×2 IMPLANT
MANIFOLD NEPTUNE II (INSTRUMENTS) ×2 IMPLANT
NDL SUT 6 .5 CRC .975X.05 MAYO (NEEDLE) ×1 IMPLANT
NEEDLE HYPO 25X1 1.5 SAFETY (NEEDLE) ×2 IMPLANT
NEEDLE MAYO TAPER (NEEDLE) ×1
NEEDLE SCORPION MULTI FIRE (NEEDLE) ×2 IMPLANT
NEEDLE SPNL 18GX3.5 QUINCKE PK (NEEDLE) ×2 IMPLANT
NS IRRIG 1000ML POUR BTL (IV SOLUTION) ×2 IMPLANT
PACK SHOULDER (CUSTOM PROCEDURE TRAY) ×2 IMPLANT
PAD ARMBOARD 7.5X6 YLW CONV (MISCELLANEOUS) ×4 IMPLANT
PUSHLOCK PEEK 4.5X24 (Orthopedic Implant) ×4 IMPLANT
SET ARTHROSCOPY TUBING (MISCELLANEOUS) ×1
SET ARTHROSCOPY TUBING LN (MISCELLANEOUS) ×1 IMPLANT
SLING ARM IMMOBILIZER MED (SOFTGOODS) IMPLANT
SPEAR FASTAKII (SLEEVE) IMPLANT
SPONGE LAP 4X18 X RAY DECT (DISPOSABLE) ×4 IMPLANT
STRIP CLOSURE SKIN 1/2X4 (GAUZE/BANDAGES/DRESSINGS) ×2 IMPLANT
SUCTION FRAZIER TIP 10 FR DISP (SUCTIONS) ×2 IMPLANT
SUT ETHILON 3 0 PS 1 (SUTURE) ×2 IMPLANT
SUT FIBERWIRE 2-0 18 17.9 3/8 (SUTURE) ×6
SUT PROLENE 3 0 PS 2 (SUTURE) ×2 IMPLANT
SUT VIC AB 0 CT1 27 (SUTURE) ×2
SUT VIC AB 0 CT1 27XBRD ANBCTR (SUTURE) ×2 IMPLANT
SUT VIC AB 1 CT1 27 (SUTURE) ×1
SUT VIC AB 1 CT1 27XBRD ANBCTR (SUTURE) ×1 IMPLANT
SUT VIC AB 2-0 CT1 27 (SUTURE) ×1
SUT VIC AB 2-0 CT1 TAPERPNT 27 (SUTURE) ×1 IMPLANT
SUT VICRYL 0 UR6 27IN ABS (SUTURE) ×2 IMPLANT
SUTURE FIBERWR 2-0 18 17.9 3/8 (SUTURE) ×3 IMPLANT
SYR 20CC LL (SYRINGE) ×4 IMPLANT
SYR 3ML LL SCALE MARK (SYRINGE) ×2 IMPLANT
SYR TB 1ML LUER SLIP (SYRINGE) ×2 IMPLANT
TAPE CLOTH SURG 4X10 WHT LF (GAUZE/BANDAGES/DRESSINGS) ×2 IMPLANT
TOWEL OR 17X24 6PK STRL BLUE (TOWEL DISPOSABLE) ×2 IMPLANT
TOWEL OR 17X26 10 PK STRL BLUE (TOWEL DISPOSABLE) ×2 IMPLANT
WAND HAND CNTRL MULTIVAC 90 (MISCELLANEOUS) ×2 IMPLANT
WATER STERILE IRR 1000ML POUR (IV SOLUTION) ×2 IMPLANT

## 2014-08-30 NOTE — Assessment & Plan Note (Signed)
D/c benazepril due to cost 1 mo ago Started lisinopril/Hctz Pt stopped norvasc Well controlled Continue light exercise No med changes today

## 2014-08-30 NOTE — Anesthesia Procedure Notes (Addendum)
Anesthesia Regional Block:  Interscalene brachial plexus block  Pre-Anesthetic Checklist: ,, timeout performed, Correct Patient, Correct Site, Correct Laterality, Correct Procedure, Correct Position, site marked, Risks and benefits discussed,  Surgical consent,  Pre-op evaluation,  At surgeon's request and post-op pain management  Laterality: Right  Prep: chloraprep       Needles:  Injection technique: Single-shot  Needle Type: Stimulator Needle - 40      Needle Gauge: 22 and 22 G    Additional Needles:  Procedures: ultrasound guided (picture in chart) Interscalene brachial plexus block Narrative:  Injection made incrementally with aspirations every 5 mL.  Performed by: Personally  Anesthesiologist: Nolon Nations R  Additional Notes: Good perineural spread   Procedure Name: Intubation Date/Time: 08/30/2014 10:11 AM Performed by: Jenne Campus Pre-anesthesia Checklist: Patient identified, Emergency Drugs available, Suction available, Patient being monitored and Timeout performed Patient Re-evaluated:Patient Re-evaluated prior to inductionOxygen Delivery Method: Circle system utilized Preoxygenation: Pre-oxygenation with 100% oxygen Intubation Type: IV induction Ventilation: Mask ventilation without difficulty and Oral airway inserted - appropriate to patient size Laryngoscope Size: Miller and 2 Grade View: Grade III Tube type: Oral Tube size: 7.0 mm Number of attempts: 1 Airway Equipment and Method: Stylet Placement Confirmation: ETT inserted through vocal cords under direct vision,  positive ETCO2,  CO2 detector and breath sounds checked- equal and bilateral Secured at: 20 cm Tube secured with: Tape Dental Injury: Teeth and Oropharynx as per pre-operative assessment

## 2014-08-30 NOTE — Brief Op Note (Signed)
08/30/2014  1:07 PM  PATIENT:  Brittany [name]  81 y.o. female  PRE-OPERATIVE DIAGNOSIS:  RIGHT SHOULDER ROTATOR CUFF TEAR  POST-OPERATIVE DIAGNOSIS:  RIGHT SHOULDER ROTATOR CUFF TEAR  PROCEDURE:  Procedure(s): SHOULDER ARTHROSCOPY WITH MINI-OPEN ROTATOR CUFF REPAIR AND SUBACROMIAL DECOMPRESSION  SURGEON:  Surgeon(s): Meredith Pel, MD  ASSISTANT: carla bethune rnfa  ANESTHESIA:   general  EBL: 100 ml    Total I/O In: 1000 [I.V.:1000] Out: 50 [Blood:50]  BLOOD ADMINISTERED: none  DRAINS: none   LOCAL MEDICATIONS USED:  none  SPECIMEN:  No Specimen  COUNTS:  YES  TOURNIQUET:  * No tourniquets in log *  DICTATION: .Other Dictation: Dictation Number [id number]  PLAN OF CARE: Admit for overnight observation  PATIENT DISPOSITION:  PACU - hemodynamically stable

## 2014-08-30 NOTE — Anesthesia Postprocedure Evaluation (Signed)
Anesthesia Post Note  Patient: Brittany [name]  Procedure(s) Performed: Procedure(s) (LRB): SHOULDER ARTHROSCOPY WITH MINI-OPEN ROTATOR CUFF REPAIR AND SUBACROMIAL DECOMPRESSION (Right)  Anesthesia type: General  Patient location: PACU  Post pain: Pain level controlled  Post assessment: Post-op Vital signs reviewed  Last Vitals: BP 162/85 mmHg  Pulse 60  Temp(Src) 36.7 C (Oral)  Resp 16  Wt 184 lb (83.462 kg)  SpO2 100%  LMP 07/22/1968  Post vital signs: Reviewed  Level of consciousness: sedated  Complications: No apparent anesthesia complications

## 2014-08-30 NOTE — Transfer of Care (Signed)
Immediate Anesthesia Transfer of Care Note  Patient: Brittany [name]  Procedure(s) Performed: Procedure(s) with comments: SHOULDER ARTHROSCOPY WITH MINI-OPEN ROTATOR CUFF REPAIR AND SUBACROMIAL DECOMPRESSION (Right) - RIGHT SHOULDER DIAGNOSTIC OPERATIVE ARTHROSCOPY, SUBACROMIAL DECOMPRESSION, MINI-OPEN ROTATOR CUFF REPAIR.  Patient Location: PACU  Anesthesia Type:General  Level of Consciousness: awake, alert , oriented and patient cooperative  Airway & Oxygen Therapy: Patient Spontanous Breathing and Patient connected to nasal cannula oxygen  Post-op Assessment: Report given to RN and Post -op Vital signs reviewed and stable  Post vital signs: Reviewed  Last Vitals:  Filed Vitals:   08/30/14 0950  BP:   Pulse: 60  Temp:   Resp: 17    Complications: No apparent anesthesia complications

## 2014-08-30 NOTE — Anesthesia Preprocedure Evaluation (Signed)
Anesthesia Evaluation  Patient identified by MRN, date of birth, ID band Patient awake    Reviewed: Allergy & Precautions, NPO status , Patient's Chart, lab work & pertinent test results  Airway Mallampati: II  TM Distance: >3 FB Neck ROM: Full    Dental no notable dental hx.    Pulmonary sleep apnea and Continuous Positive Airway Pressure Ventilation , former smoker,  breath sounds clear to auscultation  Pulmonary exam normal       Cardiovascular hypertension, negative cardio ROS  + pacemaker Rhythm:Regular Rate:Normal     Neuro/Psych PSYCHIATRIC DISORDERS Depression negative neurological ROS     GI/Hepatic Neg liver ROS, GERD-  ,  Endo/Other  Hypothyroidism   Renal/GU negative Renal ROS     Musculoskeletal  (+) Arthritis -,   Abdominal   Peds  Hematology negative hematology ROS (+)   Anesthesia Other Findings   Reproductive/Obstetrics negative OB ROS                             Anesthesia Physical Anesthesia Plan  ASA: III  Anesthesia Plan: General   Post-op Pain Management:    Induction: Intravenous  Airway Management Planned: Oral ETT  Additional Equipment: None  Intra-op Plan:   Post-operative Plan: Extubation in OR  Informed Consent: I have reviewed the patients History and Physical, chart, labs and discussed the procedure including the risks, benefits and alternatives for the proposed anesthesia with the patient or authorized representative who has indicated his/her understanding and acceptance.   Dental advisory given  Plan Discussed with: CRNA  Anesthesia Plan Comments:         Anesthesia Quick Evaluation

## 2014-08-30 NOTE — Progress Notes (Signed)
Subjective:     Brittany [name] is a 81 y.o. female presents for follow up hypothyroidism, HTN, depression, & urinary incontinence. Also, she was referred to ortho for R shoulder pain at last OV after conservative measures did not resolve pain. She will have rotator cuff surgery in 1 week. Thyroid: long standing. Takes low dose thyroid med-25 mcg. Stable, no dose changes in years. No s&s of thyroid disease: constipation, diarrhea, palpitations, jitteriness, fatigue, skin changes. HTN: well controlled. Current meds: lisinopril/HCTZ 20/12.5-this med was added 1 mo ago due to cost of benazepril (d/c'd). Also takes sotalol 80 mg for a-fib. She stopped norvasc about 1 mo ago. No intolerable SE meds. Light exercise. RF: age, over weight. Last (7/'15) microalb was nml. Depression: long-standing, started with family conflict. Current med: celexa 20 mg. She has tried to stop med, but cries easily when not taking. No intolerable SE. Urinary incontinence: long-standing. Urologist in [place] started her on DDAVP 0.2 mg am & vesicare 10 mg pm. She found the meds work best when she takes them together. She has been doing this for years. Uses meds when she is traveling or has outing. She denies anti-cholinergic SE: dry eyes, constipation, nausea. Note: vesicare can contribute to depression, arrythmia, & hyperkalemia. She is aware & wishes to continue using meds PRN.  The following portions of the patient's history were reviewed and updated as appropriate: allergies, current medications, past medical history, past social history, past surgical history and problem list.  Review of Systems Constitutional: negative for fevers and weight loss Eyes: negative for visual disturbance Respiratory: negative for cough Cardiovascular: negative for lower extremity edema    Objective:    BP 120/72 mmHg  Pulse 68  Temp(Src) 98 F (36.7 C) (Oral)  Ht 5\' 6"  (1.676 m)  Wt 184 lb (83.462 kg)  BMI 29.71 kg/m2  SpO2 96%  LMP  07/22/1968 BP 120/72 mmHg  Pulse 68  Temp(Src) 98 F (36.7 C) (Oral)  Ht 5\' 6"  (1.676 m)  Wt 184 lb (83.462 kg)  BMI 29.71 kg/m2  SpO2 96%  LMP 07/22/1968 General appearance: alert, cooperative, appears stated age and no distress Head: Normocephalic, without obvious abnormality, atraumatic Eyes: negative findings: lids and lashes normal, conjunctivae and sclerae normal and wearing glasses Neck: no adenopathy, no carotid bruit, supple, symmetrical, trachea midline and thyroid not enlarged, symmetric, no tenderness/mass/nodules Lungs: clear to auscultation bilaterally Heart: regular rate and rhythm, S1, S2 normal, no murmur, click, rub or gallop Extremities: extremities normal, atraumatic, no cyanosis or edema Pulses: 2+ and symmetric    Assessment:Plan     1. Essential hypertension, benign Chronic, stable - lisinopril-hydrochlorothiazide (ZESTORETIC) 20-12.5 MG per tablet; Take 1 tablet by mouth daily.  Dispense: 30 tablet; Refill: 1 - Magnesium  2. Urge incontinence of urine - desmopressin (DDAVP) 0.2 MG tablet; Take 1 tablet (0.2 mg total) by mouth daily.  Dispense: 90 tablet; Refill: 1 - solifenacin (VESICARE) 10 MG tablet; Take 1 tablet (10 mg total) by mouth daily.  Dispense: 90 tablet; Refill: 0 Potential SE: depression & arrhythmia. Pt aware. Wishes to continue w/meds as needed. 3. Hypothyroidism, unspecified hypothyroidism type Chronic, stable - TSH  4. Vitamin D deficiency Taking 5000 iu daily Check level to monitor for D toxicity. - Vit D  25 hydroxy (rtn osteoporosis monitoring)  5. Pain in joint, shoulder region, right Rotator cuff surgery pending  See problem list for complete A&P See pt instructions. F/u 6 mos

## 2014-08-30 NOTE — H&P (Signed)
Brittany [name] is an 81 y.o. female.   Chief Complaint: Right shoulder pain HPI: Brittany [name] is an active 81 year old female with right shoulder pain. She had an injury several months ago. MRI scanning showed retracted supraspinous tear but without muscle atrophy. She reports mild weakness with overhead activities and desires to have correction of the problem in her shoulder. This decision is made after significant amount of preoperative counseling regarding risk and benefits of surgical intervention versus of continuing with her functional level.  Past Medical History  Diagnosis Date  . Urine incontinence     takes Vesicare daily  . Urinary incontinence   . Atrial fibrillation 03/04/2013    takes Betapace daily  . Sinus node dysfunction 03/04/2013  . Pacemaker-Medtronic 03/04/2013  . Arthritis   . Osteoporosis   . Depression     takes Celexa daily  . Hypothyroidism     takes Synthroid daily  . Hypertension     takes Amlodipine and Lisinopril-HCTZ  daily  . GERD (gastroesophageal reflux disease)     takes OMeprazole daily  . Joint swelling   . History of colon polyps     benign  . Urinary frequency   . Urinary urgency   . Nocturia   . Macular degeneration     wet in the right and dry in the left  . Sleep apnea     uses CPAP and sleep study in epic    Past Surgical History  Procedure Laterality Date  . Cholecystectomy  08/11/1999  . Tonsillectomy and adenoidectomy    . Pacemaker placement Right 2012  . Cataract extraction    . Colonoscopy    . Abdominal hysterectomy  1970  . Back surgery  2015  . Esophagogastroduodenoscopy      Family History  Problem Relation Age of Onset  . Cancer Mother     colon  . Cancer Father     unknown type  . Stroke Sister   . Skin cancer Brother   . Cancer Paternal Aunt     breast  . Heart disease Brother   . Asthma Brother    Social History:  reports that she has quit smoking. Her smoking use included Cigarettes. She has a 45 pack-year  smoking history. She has never used smokeless tobacco. She reports that she drinks alcohol. She reports that she does not use illicit drugs.  Allergies:  Allergies  Allergen Reactions  . Adhesive [Tape] Swelling and Rash  . Neosporin [Neomycin-Polymyxin-Gramicidin] Swelling and Rash    Medications Prior to Admission  Medication Sig Dispense Refill  . aspirin 81 MG tablet Take 81 mg by mouth daily.    . B Complex-C (B-COMPLEX WITH VITAMIN C) tablet Take 1 tablet by mouth daily.    . Cholecalciferol (VITAMIN D3) 5000 UNITS CAPS Take 1 Can by mouth daily.    . citalopram (CELEXA) 20 MG tablet Take 1 tablet (20 mg total) by mouth daily. 90 tablet 3  . desmopressin (DDAVP) 0.2 MG tablet Take 1 tablet (0.2 mg total) by mouth daily. 90 tablet 1  . EPINEPHrine 0.3 mg/0.3 mL IJ SOAJ injection Inject 0.3 mLs (0.3 mg total) into the muscle once. 1 Device 1  . levothyroxine (SYNTHROID, LEVOTHROID) 25 MCG tablet Take 1 tablet (25 mcg total) by mouth daily before breakfast. 90 tablet 2  . lisinopril-hydrochlorothiazide (ZESTORETIC) 20-12.5 MG per tablet Take 1 tablet by mouth daily. 30 tablet 1  . Lutein-Zeaxanthin 25-5 MG CAPS Take by mouth daily.    Marland Kitchen  magnesium oxide (MAG-OX) 400 MG tablet Take 1 tablet (400 mg total) by mouth daily. 30 tablet 0  . Misc Natural Products (GLUCOSAMINE CHONDROITIN MSM PO) Take 1,500 mg by mouth 2 (two) times daily.    . Multiple Vitamin (MULTIVITAMIN WITH MINERALS) TABS tablet Take 1 tablet by mouth daily.    . Multiple Vitamins-Minerals (ICAPS AREDS FORMULA PO) Take 1 capsule by mouth 2 (two) times daily.    . Omega-3 Fatty Acids (FISH OIL) 1200 MG CAPS Take 1 capsule by mouth 2 (two) times daily.    Marland Kitchen omeprazole (PRILOSEC) 20 MG capsule Take 1 capsule (20 mg total) by mouth daily. 90 capsule 1  . solifenacin (VESICARE) 10 MG tablet Take 1 tablet (10 mg total) by mouth daily. 90 tablet 0  . sotalol (BETAPACE) 80 MG tablet TAKE ONE AND ONE-HALF TABLETS (120 MG TOTAL) BY  MOUTH TWICE DAILY 90 tablet 6    No results found for this or any previous visit (from the past 48 hour(s)). No results found.  Review of Systems  Constitutional: Negative.   HENT: Negative.   Eyes: Negative.   Respiratory: Negative.   Cardiovascular: Negative.   Gastrointestinal: Negative.   Genitourinary: Negative.   Musculoskeletal: Positive for joint pain.  Skin: Negative.   Neurological: Negative.   Endo/Heme/Allergies: Negative.   Psychiatric/Behavioral: Negative.     Blood pressure 178/79, pulse 74, temperature 97.7 F (36.5 C), temperature source Oral, resp. rate 18, weight 83.462 kg (184 lb), last menstrual period 07/22/1968, SpO2 97 %. Physical Exam  Constitutional: She appears well-developed.  HENT:  Head: Normocephalic.  Eyes: Pupils are equal, round, and reactive to light.  Neck: Normal range of motion.  Cardiovascular: Normal rate.   Respiratory: Effort normal.  Neurological: She is alert.  Skin: Skin is warm.  Psychiatric: She has a normal mood and affect.   examination of the right upper shoulder redemonstrate some coarse grinding and crepitus with active and passive range of motion of the shoulder by assessing his negative impingement signs positive Chlamydia weakness is interspace testing on the right compared to the left passive range of motion is maintained subscap infraspinatus strength is intact   Assessment/Plan Impression is right shoulder rotator cuff tear which is acute there is some retraction of the tendon but no muscle atrophy. Plan arthroscopy examination biceps tendon subacromial decompression and open rotator cuff repair risk and benefits discussed with the patient including but not limited to infection nerve vessel damage prolonged rehabilitative process potential for inability to repair the rotator cuff as well as more retraction of the tendon as well as bone quality which would potentially interfere with the ability to repair the tendon. Patient  extends risk and benefits all questions answered Safa Derner SCOTT 08/30/2014, 9:39 AM

## 2014-08-30 NOTE — Assessment & Plan Note (Signed)
Cries easily when tries to wean off celexa. Continue No dose change

## 2014-08-30 NOTE — Progress Notes (Signed)
Report given to maryann rn as caregiver 

## 2014-08-31 ENCOUNTER — Encounter (HOSPITAL_COMMUNITY): Payer: Self-pay | Admitting: Orthopedic Surgery

## 2014-08-31 DIAGNOSIS — K219 Gastro-esophageal reflux disease without esophagitis: Secondary | ICD-10-CM | POA: Diagnosis not present

## 2014-08-31 DIAGNOSIS — F329 Major depressive disorder, single episode, unspecified: Secondary | ICD-10-CM | POA: Diagnosis not present

## 2014-08-31 DIAGNOSIS — Z87891 Personal history of nicotine dependence: Secondary | ICD-10-CM | POA: Diagnosis not present

## 2014-08-31 DIAGNOSIS — E039 Hypothyroidism, unspecified: Secondary | ICD-10-CM | POA: Diagnosis not present

## 2014-08-31 DIAGNOSIS — M75101 Unspecified rotator cuff tear or rupture of right shoulder, not specified as traumatic: Secondary | ICD-10-CM | POA: Diagnosis not present

## 2014-08-31 DIAGNOSIS — I10 Essential (primary) hypertension: Secondary | ICD-10-CM | POA: Diagnosis not present

## 2014-08-31 MED ORDER — METHOCARBAMOL 500 MG PO TABS: 500.0000 mg | ORAL_TABLET | Freq: Four times a day (QID) | ORAL | Status: DC | PRN

## 2014-08-31 MED ORDER — HYDROCODONE-ACETAMINOPHEN 5-325 MG PO TABS
ORAL_TABLET | ORAL | Status: AC
  Administered 2014-08-31: 2 via ORAL
  Filled 2014-08-31: qty 2

## 2014-08-31 NOTE — Progress Notes (Signed)
Subjective: Pt stable - pain ok   Objective: Vital signs in last 24 hours: Temp:  [97.7 F (36.5 C)-98.9 F (37.2 C)] 98.4 F (36.9 C) (02/10 0400) Pulse Rate:  [60-105] 66 (02/10 0400) Resp:  [13-22] 15 (02/10 0400) BP: (142-179)/(59-85) 149/61 mmHg (02/10 0400) SpO2:  [97 %-100 %] 97 % (02/10 0400) Weight:  [83 kg (182 lb 15.7 oz)-83.462 kg (184 lb)] 83 kg (182 lb 15.7 oz) (02/09 2054)  Intake/Output from previous day: 02/09 0701 - 02/10 0700 In: [place] [P.O.:360; I.V.:1480] Out: 300 [Urine:250; Blood:50] Intake/Output this shift:    Exam:  Sensation intact distally Intact pulses distally  Labs: No results for input(s): HGB in the last 72 hours. No results for input(s): WBC, RBC, HCT, PLT in the last 72 hours. No results for input(s): NA, K, CL, CO2, BUN, CREATININE, GLUCOSE, CALCIUM in the last 72 hours. No results for input(s): LABPT, INR in the last 72 hours.  Assessment/Plan: Plan dc today - needs dressing change   DEAN,GREGORY SCOTT 08/31/2014, 8:10 AM

## 2014-08-31 NOTE — Op Note (Signed)
NAMEJACINTA, PENALVER               ACCOUNT NO.:  [id number]  MEDICAL RECORD NO.:  [id number]  LOCATION:  5W29C                        FACILITY:  [facility]  PHYSICIAN:  Anderson Malta, M.D.    DATE OF BIRTH:  Dec 03, 1933  DATE OF PROCEDURE: DATE OF DISCHARGE:                              OPERATIVE REPORT   PREOPERATIVE DIAGNOSIS:  Right shoulder rotator cuff tear.  POSTOPERATIVE DIAGNOSIS:  Right shoulder rotator cuff tear.  PROCEDURES:  Right shoulder arthroscopy with subacromial decompression, extensive debridement, and mini open rotator cuff repair of large rotator cuff tear.  SURGEON:  Anderson Malta, M.D.  ASSISTANT:  Laure Kidney, RNFA.  INDICATIONS:  Galileah is a patient with right shoulder rotator cuff tear following an injury several months ago.  She has a large-to-massive rotator cuff tear and desires an attempt at surgical correction.  DESCRIPTION OF PROCEDURE:  The patient was brought to the operating room where general endotracheal anesthesia was induced.  Preoperative antibiotics were administered.  Time-out was called.  The patient was placed in a beach-chair position with the head in neutral position, elevated to approximately 50 to 55 degrees of elevation.  Right arm was prescrubbed with alcohol and Betadine, allowed to air dry, prepped with DuraPrep solution and draped in sterile manner.  Charlie Pitter was used to cover the operative field.  Solution of saline with epinephrine was injected into the subacromial space.  Saline then injected into the shoulder joint.  Posterior portal created 2 cm medial and inferior to the posterolateral margin of the acromion.  Diagnostic arthroscopy was performed.  Anterior portal created under direct visualization.  The patient had significant synovitis within the glenohumeral joint.  This was debrided with an ArthroCare wand.  Rotator cuff tear was large and retracted in a V-shaped pattern, retracted back to the glenoid rim. Biceps tendon  was intact.  Anchor was intact.  At this time, scope was placed in subacromial space.  Lateral portal created.  Bursectomy performed.  Subacromial decompression performed with preservation of the CA ligament.  At this time, the instruments were removed.  Portals closed using 3-0 nylon.  Ioban used to cover the operative field. Incision made off the anterolateral margin of the acromion.  Deltoid split to a measured distance of 4 cm off the anterolateral margin of the acromion, marked with #1 Vicryl suture.  The patient had extensive bursitis and extensive bursa.  This was removed.  Following completion of the bursectomy, subacromial decompression was found to be adequate. The patient had significant rotator cuff tear.  Tissue quality was marginal.  Bone quality also marginal.  Using Vicryl sutures to mobilize the rotator cuff, the cuff was then mobilized and closed in a V shaped pattern.  This was done with the scorpion and 2-0 FiberWire x3.  At this time, 3 PushLocks were placed and the most medial one was at the junction of the articular surface and the footprint.  This allowed to achieve near watertight repair.  This did mobilize the rotator cuff a little bit more.  The remaining portion of the supraspinatus a little bit more posteriorly.  Rotator interval was maintained distally.  The sutures were then crossed and secured on  the lateral aspect of the humeral head using PushLock.  The bone quality on the anterior PushLock was marginal, but decent fixation was achieved.  Following repair, thorough irrigation was performed of the joint.  Deltoid split was closed using #1 Vicryl suture followed by interrupted inverted 2-0 Vicryl suture and 3-0 Prolene.  Incision was closed.  Dressing applied.  Shoulder immobilizer applied.  The patient tolerated the procedure well without immediate complications. Transferred to the recovery room in stable condition.     Anderson Malta,  M.D.     GSD/MEDQ  D:  08/30/2014  T:  08/31/2014  Job:  [id number]

## 2014-08-31 NOTE — Evaluation (Addendum)
Occupational Therapy Evaluation Patient Details Name: Brittany [name] MRN: [id number] DOB: 02-Apr-1934 Today's Date: 08/31/2014    History of Present Illness 81 y.o. s/p SHOULDER ARTHROSCOPY WITH MINI-OPEN ROTATOR CUFF REPAIR AND SUBACROMIAL DECOMPRESSION (Right).   Clinical Impression   Pt s/p above. Education provided to pt and spouse. Feel pt is safe to d/c home from OT standpoint.    Follow Up Recommendations  No OT follow up;Supervision/Assistance - 24 hour    Equipment Recommendations  None recommended by OT    Recommendations for Other Services       Precautions / Restrictions Precautions Precautions: Shoulder Type of Shoulder Precautions: no pushing, pulling, lifting with Rt UE (can use to hold light objects), no shoulder movement (MD said he is getting pt a CPM for PROM) Shoulder Interventions: Shoulder sling/immobilizer;Off for dressing/bathing/exercises Precaution Booklet Issued: Yes (comment) Precaution Comments: educated on precautions Required Braces or Orthoses: Sling Restrictions Weight Bearing Restrictions: Yes RUE Weight Bearing: Non weight bearing      Mobility Bed Mobility Overal bed mobility: Needs Assistance Bed Mobility: Supine to Sit;Sit to Supine     Supine to sit: Supervision Sit to supine: Supervision   General bed mobility comments: cues for easier technique. Recommended pt get in/out of bed on left side at home to avoid putting weight on Rt UE.  Transfers Overall transfer level: Needs assistance   Transfers: Sit to/from Stand Sit to Stand: Supervision              Balance                                            ADL Overall ADL's : Needs assistance/impaired                 Upper Body Dressing : Maximal assistance;Sitting       Toilet Transfer: Supervision/safety;Ambulation (bed)           Functional mobility during ADLs: Supervision/safety General ADL Comments: Educated on information on  shoulder handout. Educated on safety such as rugs/items on floor and recommended spouse be with her for shower transfer. Recommended sitting for LB ADLs.      Vision  Pt wears glasses all the time   Perception     Praxis      Pertinent Vitals/Pain Pain Assessment: 0-10 Pain Score:  (8-9) Pain Location: Rt shoulder Pain Intervention(s): Monitored during session     Hand Dominance Right   Extremity/Trunk Assessment Upper Extremity Assessment Upper Extremity Assessment: RUE deficits/detail RUE Deficits / Details: s/p SHOULDER ARTHROSCOPY WITH MINI-OPEN ROTATOR CUFF REPAIR AND SUBACROMIAL DECOMPRESSION (Right).   Lower Extremity Assessment Lower Extremity Assessment: Overall WFL for tasks assessed       Communication Communication Communication: No difficulties   Cognition Arousal/Alertness: Awake/alert Behavior During Therapy: WFL for tasks assessed/performed Overall Cognitive Status: Within Functional Limits for tasks assessed                     General Comments       Exercises Exercises: Other exercises;Shoulder Other Exercises Other Exercises: Pt performed right elbow flexion/extension, wrist flexion/extension and digit composite flexion/extension (OT assisted in trying to prevent shoulder from moving); pt also performed neck ROM   Shoulder Instructions Shoulder Instructions Donning/doffing shirt without moving shoulder: Maximal assistance (OT assisted pt; discussed shirts that would easier to manage) Method for sponge bathing under operated UE:  (  pt verbalized understanding) Donning/doffing sling/immobilizer:  (educated and demonstrated; spouse verbalized understanding) Correct positioning of sling/immobilizer:  (educated; verbalized understanding) ROM for elbow, wrist and digits of operated UE: Minimal assistance Sling wearing schedule (on at all times/off for ADL's): Supervision/safety (educated) Proper positioning of operated UE when showering:  (pt  able to verbalize) Positioning of UE while sleeping: Patient able to independently direct caregiver    Home Living Family/patient expects to be discharged to:: Private residence Living Arrangements: Spouse/significant other Available Help at Discharge: Family;Available 24 hours/day Type of Home: Apartment Home Access: Level entry     Home Layout: One level (elevator to 3rd floor apartment)     Bathroom Shower/Tub: Occupational psychologist: Standard     Home Equipment: Bedside commode;Walker - 4 wheels;Shower seat          Prior Functioning/Environment Level of Independence: Independent             OT Diagnosis: Acute pain   OT Problem List:     OT Treatment/Interventions:      OT Goals(Current goals can be found in the care plan section)    OT Frequency:     Barriers to D/C:            Co-evaluation              End of Session Equipment Utilized During Treatment: Gait belt;Other (comment) (sling)  Activity Tolerance: Patient tolerated treatment well Patient left: in bed;with call bell/phone within reach;with nursing/sitter in room;with family/visitor present   Time: 1029-1101 OT Time Calculation (min): 32 min Charges:  OT General Charges $OT Visit: 1 Procedure OT Evaluation $Initial OT Evaluation Tier I: 1 Procedure OT Treatments $Self Care/Home Management : 8-22 mins G-Codes: OT G-codes **NOT FOR INPATIENT CLASS** Functional Assessment Tool Used: clinical judgment Functional Limitation: Self care Self Care Current Status (Y6503): At least 40 percent but less than 60 percent impaired, limited or restricted Self Care Goal Status (T4656): At least 40 percent but less than 60 percent impaired, limited or restricted Self Care Discharge Status [phone]): At least 40 percent but less than 60 percent impaired, limited or restricted  Benito Mccreedy OTR/L [phone] 08/31/2014, 2:15 PM

## 2014-08-31 NOTE — Progress Notes (Signed)
Patient discharge teaching given, including activity, diet, follow-up appoints, and medications. Patient verbalized understanding of all discharge instructions. IV access was d/c'd. Vitals are stable. Skin is intact except as charted in most recent assessments. Pt to be escorted out by NT, to be driven home by family. 

## 2014-09-09 NOTE — Discharge Summary (Signed)
Physician Discharge Summary  Patient ID: Brittany [name] MRN: [id number] DOB/AGE: 79-Apr-1935 79 y.o.  Admit date: 08/30/2014 Discharge date: 08/31/2014 Admission Diagnoses:  Active Problems:   Rotator cuff tear   Discharge Diagnoses:  Same  Surgeries: Procedure(s): SHOULDER ARTHROSCOPY WITH MINI-OPEN ROTATOR CUFF REPAIR AND SUBACROMIAL DECOMPRESSION on 08/30/2014   Consultants:    Discharged Condition: Stable  Hospital Course: Brittany [name] is an 81 y.o. female who was admitted 08/30/2014 with a chief complaint of right shoulder pain, and found to have a diagnosis of massive right shoulder rotator cuff tear.  They were brought to the operating room on 08/30/2014 and underwent the above named procedures.  Patient tolerated the procedure well without immediate complications seen by occupational therapy the following day to begin pendulum range of motion exercises in general because of the size of rotator cuff tear we will be rehabilitating her slowly. She'll follow-up with me in 7 days for clinical recheck start CPM at that time  Antibiotics given:  Anti-infectives    Start     Dose/Rate Route Frequency Ordered Stop   08/30/14 1900  ceFAZolin (ANCEF) IVPB 1 g/50 mL premix     1 g 100 mL/hr over 30 Minutes Intravenous Every 8 hours 08/30/14 1835 08/31/14 0428   08/30/14 0600  ceFAZolin (ANCEF) IVPB 2 g/50 mL premix     2 g 100 mL/hr over 30 Minutes Intravenous On call to O.R. 08/29/14 1416 08/30/14 1012    .  Recent vital signs:  Filed Vitals:   08/31/14 1103  BP: 133/52  Pulse: 62  Temp: 97.3 F (36.3 C)  Resp: 16    Recent laboratory studies:  Results for orders placed or performed in visit on 08/26/14  TSH  Result Value Ref Range   TSH 2.16 0.35 - 4.50 uIU/mL  Vit D  25 hydroxy (rtn osteoporosis monitoring)  Result Value Ref Range   VITD 26.40 (L) 30.00 - 100.00 ng/mL  Magnesium  Result Value Ref Range   Magnesium 1.7 1.5 - 2.5 mg/dL    Discharge Medications:      Medication List    TAKE these medications        aspirin 81 MG tablet  Take 81 mg by mouth daily.     B-complex with vitamin C tablet  Take 1 tablet by mouth daily.     citalopram 20 MG tablet  Commonly known as:  CELEXA  Take 1 tablet (20 mg total) by mouth daily.     desmopressin 0.2 MG tablet  Commonly known as:  DDAVP  Take 1 tablet (0.2 mg total) by mouth daily.     EPINEPHrine 0.3 mg/0.3 mL Soaj injection  Commonly known as:  EPI-PEN  Inject 0.3 mLs (0.3 mg total) into the muscle once.     Fish Oil 1200 MG Caps  Take 1 capsule by mouth 2 (two) times daily.     GLUCOSAMINE CHONDROITIN MSM PO  Take 1,500 mg by mouth 2 (two) times daily.     ICAPS AREDS FORMULA PO  Take 1 capsule by mouth 2 (two) times daily.     levothyroxine 25 MCG tablet  Commonly known as:  SYNTHROID, LEVOTHROID  Take 1 tablet (25 mcg total) by mouth daily before breakfast.     lisinopril-hydrochlorothiazide 20-12.5 MG per tablet  Commonly known as:  ZESTORETIC  Take 1 tablet by mouth daily.     Lutein-Zeaxanthin 25-5 MG Caps  Take by mouth daily.     magnesium oxide 400 MG tablet  Commonly known as:  MAG-OX  Take 1 tablet (400 mg total) by mouth daily.     methocarbamol 500 MG tablet  Commonly known as:  ROBAXIN  Take 1 tablet (500 mg total) by mouth every 6 (six) hours as needed for muscle spasms.     multivitamin with minerals Tabs tablet  Take 1 tablet by mouth daily.     omeprazole 20 MG capsule  Commonly known as:  PRILOSEC  Take 1 capsule (20 mg total) by mouth daily.     solifenacin 10 MG tablet  Commonly known as:  VESICARE  Take 1 tablet (10 mg total) by mouth daily.     sotalol 80 MG tablet  Commonly known as:  BETAPACE  TAKE ONE AND ONE-HALF TABLETS (120 MG TOTAL) BY MOUTH TWICE DAILY     Vitamin D3 5000 UNITS Caps  Take 1 Can by mouth daily.        Diagnostic Studies: No results found.  Disposition: 01-Home or Self Care      Discharge Instructions     Call MD / Call 911    Complete by:  As directed   If you experience chest pain or shortness of breath, CALL 911 and be transported to the hospital emergency room.  If you develope a fever above 101 F, pus (white drainage) or increased drainage or redness at the wound, or calf pain, call your surgeon's office.     Change dressing    Complete by:  As directed      Constipation Prevention    Complete by:  As directed   Drink plenty of fluids.  Prune juice may be helpful.  You may use a stool softener, such as Colace (over the counter) 100 mg twice a day.  Use MiraLax (over the counter) for constipation as needed.     Diet - low sodium heart healthy    Complete by:  As directed      Discharge instructions    Complete by:  As directed   Keep incision dry CPM 1 hour 2 times a day Keep arm in sling     Increase activity slowly as tolerated    Complete by:  As directed               Signed: DEAN,GREGORY SCOTT 09/09/2014, 12:37 PM

## 2014-09-12 ENCOUNTER — Ambulatory Visit (INDEPENDENT_AMBULATORY_CARE_PROVIDER_SITE_OTHER): Payer: Medicare Other | Admitting: Pulmonary Disease

## 2014-09-12 ENCOUNTER — Encounter: Payer: Self-pay | Admitting: Pulmonary Disease

## 2014-09-12 VITALS — BP 142/82 | HR 68 | Temp 98.6°F | Ht 68.0 in | Wt 186.6 lb

## 2014-09-12 DIAGNOSIS — G4733 Obstructive sleep apnea (adult) (pediatric): Secondary | ICD-10-CM | POA: Diagnosis not present

## 2014-09-12 DIAGNOSIS — Z9989 Dependence on other enabling machines and devices: Secondary | ICD-10-CM

## 2014-09-12 NOTE — Patient Instructions (Signed)
Will get copy of CPAP report  Follow up in 1 year 

## 2014-09-12 NOTE — Progress Notes (Signed)
Chief Complaint  Patient presents with  . Follow-up    Using CPAP nightly x 8 hrs. Denies problems with mask or pressure.     History of Present Illness: Brittany [name] is a 81 y.o. female with OSA.  She has been using CPAP 8 hrs per night.  No issues with mask fit.  Her right arm is in a sling from rotator cuff injury.  This has not caused problem with placing CPAP mask.  TESTS: PSG 08/13/12 ([place]) >> AHI 24, SaO2 low 72%, CPAP 8 PSG 07/03/14 >> AHI 24.4, SaO2 low 76%, PLMI 81.8.  CPAP 8 cm H2O >> AHI 0, +S.   Past medical hx >> A fib, HTN, Depression, Hypothyroidism, GERD  Past surgical hx, Medications, Allergies, Family hx, Social hx all reviewed.   Physical Exam: Blood pressure 142/82, pulse 68, temperature 98.6 F (37 C), temperature source Oral, height 5\' 8"  (1.727 m), weight 186 lb 9.6 oz (84.641 kg), last menstrual period 07/22/1968, SpO2 98 %. Body mass index is 28.38 kg/(m^2).  General - No distress ENT - No sinus tenderness, no oral exudate, no LAN Cardiac - s1s2 regular, no murmur Chest - No wheeze/rales/dullness Back - No focal tenderness Abd - Soft, non-tender Ext - No edema, Rt arm in sling Neuro - Normal strength Skin - No rashes Psych - normal mood, and behavior   Assessment/Plan:  Obstructive sleep apnea. I have reviewed the recent sleep study results with the patient.  We discussed how sleep apnea can affect various health problems including risks for hypertension, cardiovascular disease, and diabetes.  We also discussed how sleep disruption can increase risks for accident, such as while driving.  Weight loss as a means of improving sleep apnea was also reviewed.  Additional treatment options discussed were CPAP therapy, oral appliance, and surgical intervention.  She is compliant with CPAP and reports benefit. Plan: - will continue CPAP 8 cm H2O - will get her download and call her with results   Chesley Mires, MD [facility] Pager:  [phone]

## 2014-09-14 ENCOUNTER — Ambulatory Visit (INDEPENDENT_AMBULATORY_CARE_PROVIDER_SITE_OTHER): Payer: Medicare Other | Admitting: *Deleted

## 2014-09-14 ENCOUNTER — Ambulatory Visit: Payer: Medicare Other | Admitting: Nurse Practitioner

## 2014-09-14 DIAGNOSIS — I495 Sick sinus syndrome: Secondary | ICD-10-CM | POA: Diagnosis not present

## 2014-09-14 LAB — MDC_IDC_ENUM_SESS_TYPE_REMOTE
Battery Impedance: 327 Ohm
Battery Remaining Longevity: 95 mo
Battery Voltage: 2.78 V
Brady Statistic AP VP Percent: 0 %
Brady Statistic AP VS Percent: 95 %
Brady Statistic AS VP Percent: 0 %
Brady Statistic AS VS Percent: 5 %
Date Time Interrogation Session: 20160224124705
Lead Channel Impedance Value: 425 Ohm
Lead Channel Impedance Value: 496 Ohm
Lead Channel Pacing Threshold Amplitude: 0.625 V
Lead Channel Pacing Threshold Amplitude: 0.75 V
Lead Channel Pacing Threshold Pulse Width: 0.4 ms
Lead Channel Pacing Threshold Pulse Width: 0.4 ms
Lead Channel Sensing Intrinsic Amplitude: 11.2 mV
Lead Channel Setting Pacing Amplitude: 2 V
Lead Channel Setting Pacing Amplitude: 2.5 V
Lead Channel Setting Pacing Pulse Width: 0.4 ms
Lead Channel Setting Sensing Sensitivity: 4 mV

## 2014-09-14 NOTE — Progress Notes (Signed)
Remote pacemaker transmission.   

## 2014-09-19 DIAGNOSIS — M6281 Muscle weakness (generalized): Secondary | ICD-10-CM | POA: Diagnosis not present

## 2014-09-19 DIAGNOSIS — M25511 Pain in right shoulder: Secondary | ICD-10-CM | POA: Diagnosis not present

## 2014-09-19 DIAGNOSIS — M75121 Complete rotator cuff tear or rupture of right shoulder, not specified as traumatic: Secondary | ICD-10-CM | POA: Diagnosis not present

## 2014-09-21 DIAGNOSIS — M6281 Muscle weakness (generalized): Secondary | ICD-10-CM | POA: Diagnosis not present

## 2014-09-21 DIAGNOSIS — M75121 Complete rotator cuff tear or rupture of right shoulder, not specified as traumatic: Secondary | ICD-10-CM | POA: Diagnosis not present

## 2014-09-21 DIAGNOSIS — M25511 Pain in right shoulder: Secondary | ICD-10-CM | POA: Diagnosis not present

## 2014-09-22 ENCOUNTER — Telehealth: Payer: Self-pay | Admitting: Pulmonary Disease

## 2014-09-22 NOTE — Telephone Encounter (Signed)
CPAP 08/13/14 to 09/11/14 >> used on 29 of 30 nights with average 8 hrs and 37 min.  Average AHI is 4.3 with CPAP 8 cm H2O.   Will have my nurse inform pt that CPAP report looks good.  No change to current set up.

## 2014-09-22 NOTE — Telephone Encounter (Signed)
Results have been explained to patient, pt expressed understanding. Nothing further needed.  

## 2014-09-23 DIAGNOSIS — M25511 Pain in right shoulder: Secondary | ICD-10-CM | POA: Diagnosis not present

## 2014-09-23 DIAGNOSIS — M75121 Complete rotator cuff tear or rupture of right shoulder, not specified as traumatic: Secondary | ICD-10-CM | POA: Diagnosis not present

## 2014-09-23 DIAGNOSIS — M6281 Muscle weakness (generalized): Secondary | ICD-10-CM | POA: Diagnosis not present

## 2014-09-26 DIAGNOSIS — M75121 Complete rotator cuff tear or rupture of right shoulder, not specified as traumatic: Secondary | ICD-10-CM | POA: Diagnosis not present

## 2014-09-26 DIAGNOSIS — M6281 Muscle weakness (generalized): Secondary | ICD-10-CM | POA: Diagnosis not present

## 2014-09-26 DIAGNOSIS — M25511 Pain in right shoulder: Secondary | ICD-10-CM | POA: Diagnosis not present

## 2014-09-27 DIAGNOSIS — M25511 Pain in right shoulder: Secondary | ICD-10-CM | POA: Diagnosis not present

## 2014-09-27 DIAGNOSIS — M75121 Complete rotator cuff tear or rupture of right shoulder, not specified as traumatic: Secondary | ICD-10-CM | POA: Diagnosis not present

## 2014-09-27 DIAGNOSIS — M6281 Muscle weakness (generalized): Secondary | ICD-10-CM | POA: Diagnosis not present

## 2014-09-28 ENCOUNTER — Encounter: Payer: Self-pay | Admitting: *Deleted

## 2014-09-30 DIAGNOSIS — M6281 Muscle weakness (generalized): Secondary | ICD-10-CM | POA: Diagnosis not present

## 2014-09-30 DIAGNOSIS — M25511 Pain in right shoulder: Secondary | ICD-10-CM | POA: Diagnosis not present

## 2014-09-30 DIAGNOSIS — M75121 Complete rotator cuff tear or rupture of right shoulder, not specified as traumatic: Secondary | ICD-10-CM | POA: Diagnosis not present

## 2014-10-03 DIAGNOSIS — M6281 Muscle weakness (generalized): Secondary | ICD-10-CM | POA: Diagnosis not present

## 2014-10-03 DIAGNOSIS — M25511 Pain in right shoulder: Secondary | ICD-10-CM | POA: Diagnosis not present

## 2014-10-03 DIAGNOSIS — M75121 Complete rotator cuff tear or rupture of right shoulder, not specified as traumatic: Secondary | ICD-10-CM | POA: Diagnosis not present

## 2014-10-05 DIAGNOSIS — M75121 Complete rotator cuff tear or rupture of right shoulder, not specified as traumatic: Secondary | ICD-10-CM | POA: Diagnosis not present

## 2014-10-05 DIAGNOSIS — M25511 Pain in right shoulder: Secondary | ICD-10-CM | POA: Diagnosis not present

## 2014-10-05 DIAGNOSIS — M6281 Muscle weakness (generalized): Secondary | ICD-10-CM | POA: Diagnosis not present

## 2014-10-06 ENCOUNTER — Encounter: Payer: Self-pay | Admitting: Internal Medicine

## 2014-10-07 DIAGNOSIS — M6281 Muscle weakness (generalized): Secondary | ICD-10-CM | POA: Diagnosis not present

## 2014-10-07 DIAGNOSIS — M75121 Complete rotator cuff tear or rupture of right shoulder, not specified as traumatic: Secondary | ICD-10-CM | POA: Diagnosis not present

## 2014-10-07 DIAGNOSIS — M25511 Pain in right shoulder: Secondary | ICD-10-CM | POA: Diagnosis not present

## 2014-10-10 DIAGNOSIS — M25511 Pain in right shoulder: Secondary | ICD-10-CM | POA: Diagnosis not present

## 2014-10-10 DIAGNOSIS — M75121 Complete rotator cuff tear or rupture of right shoulder, not specified as traumatic: Secondary | ICD-10-CM | POA: Diagnosis not present

## 2014-10-10 DIAGNOSIS — M6281 Muscle weakness (generalized): Secondary | ICD-10-CM | POA: Diagnosis not present

## 2014-10-12 DIAGNOSIS — M75121 Complete rotator cuff tear or rupture of right shoulder, not specified as traumatic: Secondary | ICD-10-CM | POA: Diagnosis not present

## 2014-10-12 DIAGNOSIS — M6281 Muscle weakness (generalized): Secondary | ICD-10-CM | POA: Diagnosis not present

## 2014-10-12 DIAGNOSIS — M25511 Pain in right shoulder: Secondary | ICD-10-CM | POA: Diagnosis not present

## 2014-10-14 DIAGNOSIS — M75121 Complete rotator cuff tear or rupture of right shoulder, not specified as traumatic: Secondary | ICD-10-CM | POA: Diagnosis not present

## 2014-10-14 DIAGNOSIS — M6281 Muscle weakness (generalized): Secondary | ICD-10-CM | POA: Diagnosis not present

## 2014-10-14 DIAGNOSIS — M25511 Pain in right shoulder: Secondary | ICD-10-CM | POA: Diagnosis not present

## 2014-10-17 DIAGNOSIS — M6281 Muscle weakness (generalized): Secondary | ICD-10-CM | POA: Diagnosis not present

## 2014-10-17 DIAGNOSIS — M75121 Complete rotator cuff tear or rupture of right shoulder, not specified as traumatic: Secondary | ICD-10-CM | POA: Diagnosis not present

## 2014-10-17 DIAGNOSIS — M25511 Pain in right shoulder: Secondary | ICD-10-CM | POA: Diagnosis not present

## 2014-10-19 DIAGNOSIS — M25511 Pain in right shoulder: Secondary | ICD-10-CM | POA: Diagnosis not present

## 2014-10-19 DIAGNOSIS — M6281 Muscle weakness (generalized): Secondary | ICD-10-CM | POA: Diagnosis not present

## 2014-10-19 DIAGNOSIS — M75121 Complete rotator cuff tear or rupture of right shoulder, not specified as traumatic: Secondary | ICD-10-CM | POA: Diagnosis not present

## 2014-10-21 DIAGNOSIS — M75121 Complete rotator cuff tear or rupture of right shoulder, not specified as traumatic: Secondary | ICD-10-CM | POA: Diagnosis not present

## 2014-10-21 DIAGNOSIS — M25511 Pain in right shoulder: Secondary | ICD-10-CM | POA: Diagnosis not present

## 2014-10-21 DIAGNOSIS — L570 Actinic keratosis: Secondary | ICD-10-CM | POA: Diagnosis not present

## 2014-10-21 DIAGNOSIS — M6281 Muscle weakness (generalized): Secondary | ICD-10-CM | POA: Diagnosis not present

## 2014-10-21 DIAGNOSIS — L814 Other melanin hyperpigmentation: Secondary | ICD-10-CM | POA: Diagnosis not present

## 2014-10-21 DIAGNOSIS — L578 Other skin changes due to chronic exposure to nonionizing radiation: Secondary | ICD-10-CM | POA: Diagnosis not present

## 2014-10-24 ENCOUNTER — Encounter: Payer: Self-pay | Admitting: Internal Medicine

## 2014-10-24 ENCOUNTER — Ambulatory Visit (INDEPENDENT_AMBULATORY_CARE_PROVIDER_SITE_OTHER): Payer: Medicare Other | Admitting: Internal Medicine

## 2014-10-24 VITALS — BP 152/64 | HR 66 | Ht 68.0 in | Wt 183.8 lb

## 2014-10-24 DIAGNOSIS — I480 Paroxysmal atrial fibrillation: Secondary | ICD-10-CM

## 2014-10-24 DIAGNOSIS — M6281 Muscle weakness (generalized): Secondary | ICD-10-CM | POA: Diagnosis not present

## 2014-10-24 DIAGNOSIS — M75121 Complete rotator cuff tear or rupture of right shoulder, not specified as traumatic: Secondary | ICD-10-CM | POA: Diagnosis not present

## 2014-10-24 DIAGNOSIS — Z45018 Encounter for adjustment and management of other part of cardiac pacemaker: Secondary | ICD-10-CM | POA: Diagnosis not present

## 2014-10-24 DIAGNOSIS — M25511 Pain in right shoulder: Secondary | ICD-10-CM | POA: Diagnosis not present

## 2014-10-24 LAB — MDC_IDC_ENUM_SESS_TYPE_INCLINIC
Battery Impedance: 353 Ohm
Battery Remaining Longevity: 93 mo
Battery Voltage: 2.78 V
Brady Statistic AP VP Percent: 0 %
Brady Statistic AP VS Percent: 99 %
Brady Statistic AS VP Percent: 0 %
Brady Statistic AS VS Percent: 1 %
Date Time Interrogation Session: 20160404135844
Lead Channel Impedance Value: 475 Ohm
Lead Channel Impedance Value: 522 Ohm
Lead Channel Pacing Threshold Amplitude: 0.75 V
Lead Channel Pacing Threshold Amplitude: 0.75 V
Lead Channel Pacing Threshold Pulse Width: 0.4 ms
Lead Channel Pacing Threshold Pulse Width: 0.4 ms
Lead Channel Sensing Intrinsic Amplitude: 15.67 mV
Lead Channel Setting Pacing Amplitude: 2 V
Lead Channel Setting Pacing Amplitude: 2.5 V
Lead Channel Setting Pacing Pulse Width: 0.4 ms
Lead Channel Setting Sensing Sensitivity: 5.6 mV

## 2014-10-24 MED ORDER — SOTALOL HCL 80 MG PO TABS: ORAL_TABLET | ORAL | Status: DC

## 2014-10-24 NOTE — Progress Notes (Signed)
Patient Care Team: Irene Pap, NP as PCP - General (Nurse Practitioner)   HPI  Brittany [name] is a 81 y.o. female Seen in followup for  pacer implanted 12/12 for sinus node dysfunction.  She has atrial fibrillation for which she takes sotalol currently of 120 mg twice daily.    Cardiac evaluation has included a Myoview 8/12 which demonstrated no ischemia and normal left ventricular function  The patient denies chest pain, shortness of breath, nocturnal dyspnea, orthopnea or peripheral edema.  There have been no palpitations, lightheadedness or syncope.   Thromboembolic risk factors include age-47 hypertension-1 for a CHADS-  score of 2 and a CHADS-VASc score of 4 we had discussions re anticoagulation;  she has been averse to  Anticoagulation     Past Medical History  Diagnosis Date  . Urine incontinence     takes Vesicare daily  . Urinary incontinence   . Atrial fibrillation 03/04/2013    takes Betapace daily  . Sinus node dysfunction 03/04/2013  . Pacemaker-Medtronic 03/04/2013  . Osteoporosis   . Depression     takes Celexa daily  . Hypothyroidism     takes Synthroid daily  . Hypertension     takes Amlodipine and Lisinopril-HCTZ  daily  . GERD (gastroesophageal reflux disease)     takes OMeprazole daily  . Joint swelling   . History of colon polyps     benign  . Urinary frequency   . Urinary urgency   . Nocturia   . Macular degeneration     wet in the right and dry in the left  . OSA on CPAP     sleep study in epic  . Arthritis     "back" (08/30/2014)    Past Surgical History  Procedure Laterality Date  . Tonsillectomy and adenoidectomy    . Pacemaker placement Right 2012  . Cataract extraction    . Colonoscopy    . Esophagogastroduodenoscopy    . Shoulder arthroscopy w/ rotator cuff repair Right 08/30/2014    WITH MINI-OPEN ROTATOR CUFF REPAIR AND SUBACROMIAL DECOMPRESSION  . Laparoscopic cholecystectomy  08/11/1999  . Insert / replace / remove  pacemaker    . Lumbar disc surgery  02/2014  . Back surgery    . Vaginal hysterectomy  1970  . Cataract extraction w/ intraocular lens  implant, bilateral Bilateral   . Shoulder arthroscopy with rotator cuff repair and subacromial decompression Right 08/30/2014    Procedure: SHOULDER ARTHROSCOPY WITH MINI-OPEN ROTATOR CUFF REPAIR AND SUBACROMIAL DECOMPRESSION;  Surgeon: Meredith Pel, MD;  Location: [facility];  Service: Orthopedics;  Laterality: Right;  RIGHT SHOULDER DIAGNOSTIC OPERATIVE ARTHROSCOPY, SUBACROMIAL DECOMPRESSION, MINI-OPEN ROTATOR CUFF REPAIR.    Current Outpatient Prescriptions  Medication Sig Dispense Refill  . aspirin 81 MG tablet Take 81 mg by mouth daily.    . B Complex-C (B-COMPLEX WITH VITAMIN C) tablet Take 1 tablet by mouth daily.    . Cholecalciferol (VITAMIN D3) 5000 UNITS CAPS Take 1 Can by mouth daily.    . citalopram (CELEXA) 20 MG tablet Take 1 tablet (20 mg total) by mouth daily. 90 tablet 3  . desmopressin (DDAVP) 0.2 MG tablet Take 1 tablet (0.2 mg total) by mouth daily. 90 tablet 1  . EPINEPHrine 0.3 mg/0.3 mL IJ SOAJ injection Inject 0.3 mLs (0.3 mg total) into the muscle once. 1 Device 1  . levothyroxine (SYNTHROID, LEVOTHROID) 25 MCG tablet Take 1 tablet (25 mcg total) by mouth daily before breakfast. 90  tablet 2  . lisinopril-hydrochlorothiazide (ZESTORETIC) 20-12.5 MG per tablet Take 1 tablet by mouth daily. 30 tablet 1  . Lutein-Zeaxanthin 25-5 MG CAPS Take by mouth daily.    . magnesium oxide (MAG-OX) 400 MG tablet Take 1 tablet (400 mg total) by mouth daily. 30 tablet 0  . methocarbamol (ROBAXIN) 500 MG tablet Take 1 tablet (500 mg total) by mouth every 6 (six) hours as needed for muscle spasms. 30 tablet 0  . Misc Natural Products (GLUCOSAMINE CHONDROITIN MSM PO) Take 1,500 mg by mouth 2 (two) times daily.    . Multiple Vitamin (MULTIVITAMIN WITH MINERALS) TABS tablet Take 1 tablet by mouth daily.    . Multiple Vitamins-Minerals (ICAPS AREDS FORMULA  PO) Take 1 capsule by mouth 2 (two) times daily.    . Omega-3 Fatty Acids (FISH OIL) 1200 MG CAPS Take 1 capsule by mouth 2 (two) times daily.    Marland Kitchen omeprazole (PRILOSEC) 20 MG capsule Take 1 capsule (20 mg total) by mouth daily. 90 capsule 1  . solifenacin (VESICARE) 10 MG tablet Take 1 tablet (10 mg total) by mouth daily. 90 tablet 0  . sotalol (BETAPACE) 80 MG tablet TAKE ONE AND ONE-HALF TABLETS (120 MG TOTAL) BY MOUTH TWICE DAILY 90 tablet 6   No current facility-administered medications for this visit.    Allergies  Allergen Reactions  . Adhesive [Tape] Swelling and Rash  . Neosporin [Neomycin-Polymyxin-Gramicidin] Swelling and Rash    Review of Systems negative except from HPI and PMH  Physical Exam BP 152/64 mmHg  Pulse 66  Ht 5\' 8"  (1.727 m)  Wt 183 lb 12.8 oz (83.371 kg)  BMI 27.95 kg/m2  LMP 07/22/1968 Well developed and well nourished in no acute distress HENT normal E scleral and icterus clear Neck Supple JVP flat; carotids brisk and full Clear to ausculation  Regular rate and rhythm, no murmurs gallops or rub Soft with active bowel sounds No clubbing cyanosis  Edema Alert and oriented, grossly normal motor and sensory function Skin Warm and Dry  ECG  Was ordered today demonstrated atrial paced rhythm at 66 Intervals 28/11/44 Otherwise normal   Assessment and  Plan  Atrial fibrillation-paroxysmal     Sinus bradycardia   Pacemaker-Medtronic  The patient's device was interrogated.  The information was reviewed. No changes were made in the programming.    Hypertension  Pressure is well-controlled  No intercurrent atrial fibrillation;   We  had a lengthy discussion regarding the relative risks and benefits of aspirin versus anticoagulation. She is a averse anticoagulation  but but will think about it  100% atrial paced with good heart rate excursion  Potassium and magnesium levels on sotalol. QT interval is within range  Blood pressure was mildly  elevated. Prior assessments over the preceding 2 months ago have been frequently in the normal range. We will not make any interventions at this time

## 2014-10-24 NOTE — Patient Instructions (Addendum)
Your physician recommends that you continue on your current medications as directed. Please refer to the Current Medication list given to you today.  Remote monitoring is used to monitor your Pacemaker of ICD from home. This monitoring reduces the number of office visits required to check your device to one time per year. It allows [place] to keep an eye on the functioning of your device to ensure it is working properly. You are scheduled for a device check from home on 01/31/15. You may send your transmission at any time that day. If you have a wireless device, the transmission will be sent automatically. After your physician reviews your transmission, you will receive a postcard with your next transmission date.  Your physician wants you to follow-up in: 1 year with Chanetta Marshall, NP.  You will receive a reminder letter in the mail two months in advance. If you don't receive a letter, please call our office to schedule the follow-up appointment.

## 2014-10-26 DIAGNOSIS — M6281 Muscle weakness (generalized): Secondary | ICD-10-CM | POA: Diagnosis not present

## 2014-10-26 DIAGNOSIS — M25511 Pain in right shoulder: Secondary | ICD-10-CM | POA: Diagnosis not present

## 2014-10-26 DIAGNOSIS — M75121 Complete rotator cuff tear or rupture of right shoulder, not specified as traumatic: Secondary | ICD-10-CM | POA: Diagnosis not present

## 2014-10-27 ENCOUNTER — Telehealth: Payer: Self-pay | Admitting: Cardiovascular Disease

## 2014-10-28 DIAGNOSIS — M75121 Complete rotator cuff tear or rupture of right shoulder, not specified as traumatic: Secondary | ICD-10-CM | POA: Diagnosis not present

## 2014-10-28 DIAGNOSIS — M6281 Muscle weakness (generalized): Secondary | ICD-10-CM | POA: Diagnosis not present

## 2014-10-28 DIAGNOSIS — M25511 Pain in right shoulder: Secondary | ICD-10-CM | POA: Diagnosis not present

## 2014-10-31 DIAGNOSIS — M75121 Complete rotator cuff tear or rupture of right shoulder, not specified as traumatic: Secondary | ICD-10-CM | POA: Diagnosis not present

## 2014-10-31 DIAGNOSIS — M25511 Pain in right shoulder: Secondary | ICD-10-CM | POA: Diagnosis not present

## 2014-10-31 DIAGNOSIS — M6281 Muscle weakness (generalized): Secondary | ICD-10-CM | POA: Diagnosis not present

## 2014-11-01 DIAGNOSIS — M25511 Pain in right shoulder: Secondary | ICD-10-CM | POA: Diagnosis not present

## 2014-11-01 DIAGNOSIS — M6281 Muscle weakness (generalized): Secondary | ICD-10-CM | POA: Diagnosis not present

## 2014-11-01 DIAGNOSIS — M75121 Complete rotator cuff tear or rupture of right shoulder, not specified as traumatic: Secondary | ICD-10-CM | POA: Diagnosis not present

## 2014-11-03 DIAGNOSIS — M6281 Muscle weakness (generalized): Secondary | ICD-10-CM | POA: Diagnosis not present

## 2014-11-03 DIAGNOSIS — M75121 Complete rotator cuff tear or rupture of right shoulder, not specified as traumatic: Secondary | ICD-10-CM | POA: Diagnosis not present

## 2014-11-03 DIAGNOSIS — M25511 Pain in right shoulder: Secondary | ICD-10-CM | POA: Diagnosis not present

## 2014-11-07 ENCOUNTER — Other Ambulatory Visit: Payer: Self-pay

## 2014-11-07 DIAGNOSIS — I10 Essential (primary) hypertension: Secondary | ICD-10-CM

## 2014-11-07 DIAGNOSIS — M6281 Muscle weakness (generalized): Secondary | ICD-10-CM | POA: Diagnosis not present

## 2014-11-07 DIAGNOSIS — M25511 Pain in right shoulder: Secondary | ICD-10-CM | POA: Diagnosis not present

## 2014-11-07 DIAGNOSIS — M75121 Complete rotator cuff tear or rupture of right shoulder, not specified as traumatic: Secondary | ICD-10-CM | POA: Diagnosis not present

## 2014-11-07 DIAGNOSIS — E039 Hypothyroidism, unspecified: Secondary | ICD-10-CM

## 2014-11-07 MED ORDER — LEVOTHYROXINE SODIUM 25 MCG PO TABS: 25.0000 ug | ORAL_TABLET | Freq: Every day | ORAL | Status: DC

## 2014-11-07 MED ORDER — LISINOPRIL-HYDROCHLOROTHIAZIDE 20-12.5 MG PO TABS: 1.0000 | ORAL_TABLET | Freq: Every day | ORAL | Status: DC

## 2014-11-07 NOTE — Telephone Encounter (Signed)
Please Advise Refill Request? Refill request for- Lisinopril/HCTZ 20-12.5mg  Last filled by MD on - 08/26/14 Last Appt - 08/26/14        Next Appt - 02/24/15 Pharmacy- [facility]

## 2014-11-07 NOTE — Telephone Encounter (Signed)
Please Advise Refill Request? Refill request for- Levothyroxine 25MCG Last filled by MD on - 12/22/13 Last Appt - 08/26/14        Next Appt - 02/24/15 [facility]- [facility]

## 2014-11-09 DIAGNOSIS — M6281 Muscle weakness (generalized): Secondary | ICD-10-CM | POA: Diagnosis not present

## 2014-11-09 DIAGNOSIS — M9903 Segmental and somatic dysfunction of lumbar region: Secondary | ICD-10-CM | POA: Diagnosis not present

## 2014-11-09 DIAGNOSIS — M545 Low back pain: Secondary | ICD-10-CM | POA: Diagnosis not present

## 2014-11-09 DIAGNOSIS — S339XXA Sprain of unspecified parts of lumbar spine and pelvis, initial encounter: Secondary | ICD-10-CM | POA: Diagnosis not present

## 2014-11-09 DIAGNOSIS — M75121 Complete rotator cuff tear or rupture of right shoulder, not specified as traumatic: Secondary | ICD-10-CM | POA: Diagnosis not present

## 2014-11-09 DIAGNOSIS — M25511 Pain in right shoulder: Secondary | ICD-10-CM | POA: Diagnosis not present

## 2014-11-11 DIAGNOSIS — M6281 Muscle weakness (generalized): Secondary | ICD-10-CM | POA: Diagnosis not present

## 2014-11-11 DIAGNOSIS — M25511 Pain in right shoulder: Secondary | ICD-10-CM | POA: Diagnosis not present

## 2014-11-11 DIAGNOSIS — M75121 Complete rotator cuff tear or rupture of right shoulder, not specified as traumatic: Secondary | ICD-10-CM | POA: Diagnosis not present

## 2014-11-11 DIAGNOSIS — M545 Low back pain: Secondary | ICD-10-CM | POA: Diagnosis not present

## 2014-11-11 DIAGNOSIS — S339XXA Sprain of unspecified parts of lumbar spine and pelvis, initial encounter: Secondary | ICD-10-CM | POA: Diagnosis not present

## 2014-11-11 DIAGNOSIS — M9903 Segmental and somatic dysfunction of lumbar region: Secondary | ICD-10-CM | POA: Diagnosis not present

## 2014-11-15 DIAGNOSIS — M545 Low back pain: Secondary | ICD-10-CM | POA: Diagnosis not present

## 2014-11-15 DIAGNOSIS — S339XXA Sprain of unspecified parts of lumbar spine and pelvis, initial encounter: Secondary | ICD-10-CM | POA: Diagnosis not present

## 2014-11-15 DIAGNOSIS — M9903 Segmental and somatic dysfunction of lumbar region: Secondary | ICD-10-CM | POA: Diagnosis not present

## 2014-11-15 DIAGNOSIS — H3531 Nonexudative age-related macular degeneration: Secondary | ICD-10-CM | POA: Diagnosis not present

## 2014-11-25 DIAGNOSIS — L570 Actinic keratosis: Secondary | ICD-10-CM | POA: Diagnosis not present

## 2014-11-25 DIAGNOSIS — L814 Other melanin hyperpigmentation: Secondary | ICD-10-CM | POA: Diagnosis not present

## 2014-11-25 DIAGNOSIS — L578 Other skin changes due to chronic exposure to nonionizing radiation: Secondary | ICD-10-CM | POA: Diagnosis not present

## 2014-12-06 DIAGNOSIS — H43813 Vitreous degeneration, bilateral: Secondary | ICD-10-CM | POA: Diagnosis not present

## 2014-12-06 DIAGNOSIS — H3532 Exudative age-related macular degeneration: Secondary | ICD-10-CM | POA: Diagnosis not present

## 2014-12-06 DIAGNOSIS — H3531 Nonexudative age-related macular degeneration: Secondary | ICD-10-CM | POA: Diagnosis not present

## 2014-12-12 ENCOUNTER — Other Ambulatory Visit: Payer: Self-pay | Admitting: Internal Medicine

## 2014-12-12 DIAGNOSIS — R918 Other nonspecific abnormal finding of lung field: Secondary | ICD-10-CM

## 2014-12-30 ENCOUNTER — Ambulatory Visit (INDEPENDENT_AMBULATORY_CARE_PROVIDER_SITE_OTHER)
Admission: RE | Admit: 2014-12-30 | Discharge: 2014-12-30 | Disposition: A | Discharge status: Home / Self Care | Payer: Medicare Other | Source: Ambulatory Visit | Attending: Internal Medicine | Admitting: Internal Medicine

## 2014-12-30 DIAGNOSIS — R918 Other nonspecific abnormal finding of lung field: Secondary | ICD-10-CM

## 2014-12-30 DIAGNOSIS — I700 Atherosclerosis of aorta: Secondary | ICD-10-CM | POA: Diagnosis not present

## 2014-12-30 NOTE — Progress Notes (Signed)
Quick Note:  Spoke with pt and notified of results per Dr. Wert. Pt verbalized understanding and denied any questions.  ______ 

## 2015-01-04 DIAGNOSIS — M75121 Complete rotator cuff tear or rupture of right shoulder, not specified as traumatic: Secondary | ICD-10-CM | POA: Diagnosis not present

## 2015-01-05 ENCOUNTER — Other Ambulatory Visit: Payer: Self-pay | Admitting: Nurse Practitioner

## 2015-01-05 DIAGNOSIS — N3941 Urge incontinence: Secondary | ICD-10-CM

## 2015-01-05 MED ORDER — DESMOPRESSIN ACETATE 0.2 MG PO TABS: 0.2000 mg | ORAL_TABLET | Freq: Every day | ORAL | Status: DC

## 2015-01-05 MED ORDER — SOLIFENACIN SUCCINATE 10 MG PO TABS: 10.0000 mg | ORAL_TABLET | Freq: Every day | ORAL | Status: DC

## 2015-01-05 NOTE — Telephone Encounter (Signed)
Rx put in mail

## 2015-01-05 NOTE — Telephone Encounter (Signed)
Pt is requesting that we mail her refill rx's for her DDvap 0.2 mg and Vesicare 10mg . She would like [place] to mail them to her as she needs to mail them to [place].

## 2015-01-05 NOTE — Telephone Encounter (Signed)
Please advise 

## 2015-01-31 ENCOUNTER — Ambulatory Visit (INDEPENDENT_AMBULATORY_CARE_PROVIDER_SITE_OTHER): Payer: Medicare Other | Admitting: *Deleted

## 2015-01-31 DIAGNOSIS — I495 Sick sinus syndrome: Secondary | ICD-10-CM

## 2015-01-31 NOTE — Progress Notes (Signed)
Remote pacemaker transmission.   

## 2015-02-02 LAB — CUP PACEART REMOTE DEVICE CHECK
Battery Impedance: 378 Ohm
Battery Remaining Longevity: 91 mo
Battery Voltage: 2.79 V
Brady Statistic AP VP Percent: 0 %
Brady Statistic AP VS Percent: 100 %
Brady Statistic AS VP Percent: 0 %
Brady Statistic AS VS Percent: 0 %
Date Time Interrogation Session: 20160712123619
Lead Channel Impedance Value: 483 Ohm
Lead Channel Impedance Value: 576 Ohm
Lead Channel Pacing Threshold Amplitude: 0.625 V
Lead Channel Pacing Threshold Amplitude: 0.75 V
Lead Channel Pacing Threshold Pulse Width: 0.4 ms
Lead Channel Pacing Threshold Pulse Width: 0.4 ms
Lead Channel Sensing Intrinsic Amplitude: 22.4 mV
Lead Channel Setting Pacing Amplitude: 2 V
Lead Channel Setting Pacing Amplitude: 2.5 V
Lead Channel Setting Pacing Pulse Width: 0.4 ms
Lead Channel Setting Sensing Sensitivity: 5.6 mV

## 2015-02-06 ENCOUNTER — Other Ambulatory Visit: Payer: Self-pay

## 2015-02-06 MED ORDER — SOTALOL HCL 80 MG PO TABS: ORAL_TABLET | ORAL | Status: DC

## 2015-02-23 DIAGNOSIS — M4716 Other spondylosis with myelopathy, lumbar region: Secondary | ICD-10-CM | POA: Diagnosis not present

## 2015-02-23 DIAGNOSIS — M5442 Lumbago with sciatica, left side: Secondary | ICD-10-CM | POA: Diagnosis not present

## 2015-02-24 ENCOUNTER — Encounter: Payer: Self-pay | Admitting: Cardiology

## 2015-02-24 ENCOUNTER — Ambulatory Visit (INDEPENDENT_AMBULATORY_CARE_PROVIDER_SITE_OTHER): Payer: Medicare Other | Admitting: Family Medicine

## 2015-02-24 ENCOUNTER — Encounter: Payer: Self-pay | Admitting: Family Medicine

## 2015-02-24 ENCOUNTER — Ambulatory Visit: Payer: Medicare Other | Admitting: Nurse Practitioner

## 2015-02-24 VITALS — BP 120/74 | HR 62 | Temp 98.0°F | Resp 16 | Ht 66.0 in | Wt 188.0 lb

## 2015-02-24 DIAGNOSIS — I10 Essential (primary) hypertension: Secondary | ICD-10-CM

## 2015-02-24 DIAGNOSIS — R0789 Other chest pain: Secondary | ICD-10-CM | POA: Diagnosis not present

## 2015-02-24 DIAGNOSIS — E039 Hypothyroidism, unspecified: Secondary | ICD-10-CM | POA: Diagnosis not present

## 2015-02-24 LAB — COMPLETE METABOLIC PANEL WITH GFR
ALT: 18 U/L (ref 6–29)
AST: 20 U/L (ref 10–35)
Albumin: 4 g/dL (ref 3.6–5.1)
Alkaline Phosphatase: 73 U/L (ref 33–130)
BUN: 11 mg/dL (ref 7–25)
CO2: 30 mmol/L (ref 20–31)
Calcium: 10.3 mg/dL (ref 8.6–10.4)
Chloride: 92 mmol/L — ABNORMAL LOW (ref 98–110)
Creat: 0.58 mg/dL — ABNORMAL LOW (ref 0.60–0.93)
GFR, Est African American: >89 mL/min (ref >=60)
GFR, Est Non African American: 89 mL/min (ref >=60)
Glucose, Bld: 101 mg/dL — ABNORMAL HIGH (ref 65–99)
Potassium: 4.6 mmol/L (ref 3.5–5.3)
Sodium: 130 mmol/L — ABNORMAL LOW (ref 135–146)
Total Bilirubin: 0.6 mg/dL (ref 0.2–1.2)
Total Protein: 6.8 g/dL (ref 6.1–8.1)

## 2015-02-24 NOTE — Progress Notes (Signed)
Pre visit review using our clinic review tool, if applicable. No additional management support is needed unless otherwise documented below in the visit note. 

## 2015-02-24 NOTE — Progress Notes (Signed)
Subjective:    Patient ID: Brittany [name], female    DOB: [birth year], 81 y.o.   MRN: [id number]  HPI  Hypertension: Vision presented for scheduled office visit for follow-up on her blood pressure. She reports that they recently had to change her medications on her last appointment secondary to a formulary change within her insurance. Patient does not take her blood pressures at home. She denies any chest pain, shortness of breath, visual changes or dizziness. She denies any lower extremity edema. He is tolerating the new medication without any side effects. He now takes lisinopril-HCTZ 20-12 0.5. Patient takes a baby aspirin, she does have A. fib but does not want to take a blood thinner. She follows up with Dr. Caryl Comes for her pacemaker.  Hypothyroid: Patient states she does not want to take her thyroid medication any longer. She currently is on 25 g of levothyroxine. She states she has been on for years, and she only took it to make her other doctor happy. Her last TSH February 2016 was 2.16. Patient states she's never felt like medication to do anything to help her.  Of note, patient recently underwent rotator cuff surgery, she is doing well has full range of motion back into her arm.  Former smoker Past Medical History  Diagnosis Date  . Urine incontinence     takes Vesicare daily  . Urinary incontinence   . Atrial fibrillation 03/04/2013    takes Betapace daily  . Sinus node dysfunction 03/04/2013  . Pacemaker-Medtronic 03/04/2013  . Osteoporosis   . Depression     takes Celexa daily  . Hypothyroidism     takes Synthroid daily  . Hypertension     takes Amlodipine and Lisinopril-HCTZ  daily  . GERD (gastroesophageal reflux disease)     takes OMeprazole daily  . Joint swelling   . History of colon polyps     benign  . Urinary frequency   . Urinary urgency   . Nocturia   . Macular degeneration     wet in the right and dry in the left  . OSA on CPAP     sleep study in epic  .  Arthritis     "back" (08/30/2014)   Allergies  Allergen Reactions  . Adhesive [Tape] Swelling and Rash  . Neosporin [Neomycin-Polymyxin-Gramicidin] Swelling and Rash   Past Surgical History  Procedure Laterality Date  . Tonsillectomy and adenoidectomy    . Pacemaker placement Right 2012  . Cataract extraction    . Colonoscopy    . Esophagogastroduodenoscopy    . Shoulder arthroscopy w/ rotator cuff repair Right 08/30/2014    WITH MINI-OPEN ROTATOR CUFF REPAIR AND SUBACROMIAL DECOMPRESSION  . Laparoscopic cholecystectomy  08/11/1999  . Insert / replace / remove pacemaker    . Lumbar disc surgery  02/2014  . Back surgery    . Vaginal hysterectomy  1970  . Cataract extraction w/ intraocular lens  implant, bilateral Bilateral   . Shoulder arthroscopy with rotator cuff repair and subacromial decompression Right 08/30/2014    Procedure: SHOULDER ARTHROSCOPY WITH MINI-OPEN ROTATOR CUFF REPAIR AND SUBACROMIAL DECOMPRESSION;  Surgeon: Meredith Pel, MD;  Location: [facility];  Service: Orthopedics;  Laterality: Right;  RIGHT SHOULDER DIAGNOSTIC OPERATIVE ARTHROSCOPY, SUBACROMIAL DECOMPRESSION, MINI-OPEN ROTATOR CUFF REPAIR.   Family History  Problem Relation Age of Onset  . Cancer Mother     colon  . Cancer Father     unknown type  . Stroke Sister   . Skin cancer Brother   .  Cancer Paternal Aunt     breast  . Heart disease Brother   . Asthma Brother    History   Social History  . Marital Status: Married    Spouse Name: N/A  . Number of Children: 4  . Years of Education: 12   Occupational History  . [employer] Manager      retired   Social History Main Topics  . Smoking status: Former Smoker -- 1.00 packs/day for 45 years    Types: Cigarettes  . Smokeless tobacco: Never Used     Comment: quit smoking in 1992  . Alcohol Use: 16.8 oz/week    28 Glasses of wine per week     Comment: 08/30/2014 "16oz wine q night"  . Drug Use: No  . Sexual Activity: Yes    Birth Control/  Protection: Surgical   Other Topics Concern  . Not on file   Social History Narrative   Ms. Litaker lives with her husband at [employer]. They are retired and recently relocated to [facility] from [place]. She has 4 grown children.    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    Review of Systems  Constitutional: Negative for activity change, appetite change, fatigue and unexpected weight change.  HENT: Negative.   Eyes: Negative.   Respiratory: Negative for cough, chest tightness, shortness of breath and wheezing.   Cardiovascular: Negative for chest pain, palpitations and leg swelling.  Gastrointestinal: Positive for constipation.      Objective:   Physical Exam BP 120/74 mmHg  Pulse 62  Temp(Src) 98 F (36.7 C) (Temporal)  Resp 16  Ht 5\' 6"  (1.676 m)  Wt 188 lb (85.276 kg)  BMI 30.36 kg/m2  SpO2 98%  LMP 07/22/1968 Gen: NAD. Nontoxic in appearance, well-developed, well-nourished, Caucasian female. HEENT: AT. [place]. Bilateral TM visualized and normal in appearance. Bilateral eyes without injections or icterus. MMM. Bilateral nares without erythema or swelling. Throat without erythema or exudates.  Neck: Supple, no lymphadenopathy, no thyromegaly. CV: RRR no murmur Chest: CTAB, no wheeze or crackles  Ext: No erythema. No edema.+ 2/4 PT     Assessment & Plan:  Brittany [name] is a 81 y.o. female presents for follow-up on hypertension - Hypertension: Patient is stable, blood pressure is good and well controlled on current regimen. Continue lisinopril/HCTZ 20-12 0.5. Continue to eat low-salt diet. Get plenty of exercise. - Hypothyroid: Discuss signs and symptoms of hypothyroid today with patient. Including palpitations, irregular heart rate. Patient still desired to have a trial off of her thyroid medication. We discussed today and decided we would give her a trial off of the 25 g at his is an extremely low dose. Red flags discussed in detail, AVS  given to patient today on signs and symptoms of hypothyroid. Patient warned if she feels any different, or experiences any of the signs or symptoms she is to restart her medication and call immediately. We'll recheck TSH/T10free in 8 weeks.

## 2015-02-24 NOTE — Assessment & Plan Note (Signed)
Hypothyroid: Discuss signs and symptoms of hypothyroid today with patient. Including palpitations, irregular heart rate. Patient still desired to have a trial off of her thyroid medication. We discussed today and decided we would give her a trial off of the 25 g at his is an extremely low dose. Red flags discussed in detail, AVS given to patient today on signs and symptoms of hypothyroid. Patient warned if she feels any different, or experiences any of the signs or symptoms she is to restart her medication and call immediately. We'll recheck TSH/T33free in 8 weeks. Future order placed

## 2015-02-24 NOTE — Patient Instructions (Signed)
You look great, I will see you in 6 months unless you need me sooner.  Mid October have TSH re-drawn for thyroid. You will just need lab appt only. Unless you notice signs of low thyroid. We will give you trial off levothyroxine.  I will call you with your lab once available.     Hypothyroidism The thyroid is a large gland located in the lower front of your neck. The thyroid gland helps control metabolism. Metabolism is how your body handles food. It controls metabolism with the hormone thyroxine. When this gland is underactive (hypothyroid), it produces too little hormone.  CAUSES These include:   Absence or destruction of thyroid tissue.  Goiter due to iodine deficiency.  Goiter due to medications.  Congenital defects (since birth).  Problems with the pituitary. This causes a lack of TSH (thyroid stimulating hormone). This hormone tells the thyroid to turn out more hormone. SYMPTOMS  Lethargy (feeling as though you have no energy)  Cold intolerance  Weight gain (in spite of normal food intake)  Dry skin  Coarse hair  Menstrual irregularity (if severe, may lead to infertility)  Slowing of thought processes  Palpitations Cardiac problems are also caused by insufficient amounts of thyroid hormone. Hypothyroidism in the newborn is cretinism, and is an extreme form. It is important that this form be treated adequately and immediately or it will lead rapidly to retarded physical and mental development. DIAGNOSIS  To prove hypothyroidism, your caregiver may do blood tests and ultrasound tests. Sometimes the signs are hidden. It may be necessary for your caregiver to watch this illness with blood tests either before or after diagnosis and treatment. TREATMENT  Low levels of thyroid hormone are increased by using synthetic thyroid hormone. This is a safe, effective treatment. It usually takes about four weeks to gain the full effects of the medication. After you have the full effect  of the medication, it will generally take another four weeks for problems to leave. Your caregiver may start you on low doses. If you have had heart problems the dose may be gradually increased. It is generally not an emergency to get rapidly to normal. HOME CARE INSTRUCTIONS   Take your medications as your caregiver suggests. Let your caregiver know of any medications you are taking or start taking. Your caregiver will help you with dosage schedules.  As your condition improves, your dosage needs may increase. It will be necessary to have continuing blood tests as suggested by your caregiver.  Report all suspected medication side effects to your caregiver. SEEK MEDICAL CARE IF: Seek medical care if you develop:  Sweating.  Tremulousness (tremors).  Anxiety.  Rapid weight loss.  Heat intolerance.  Emotional swings.  Diarrhea.  Weakness. SEEK IMMEDIATE MEDICAL CARE IF:  You develop chest pain, an irregular heart beat (palpitations), or a rapid heart beat. MAKE SURE YOU:   Understand these instructions.  Will watch your condition.  Will get help right away if you are not doing well or get worse. Document Released: 07/08/2005 Document Revised: 09/30/2011 Document Reviewed: 02/26/2008 [facility] Patient Information 2015 [place], [place]. This information is not intended to replace advice given to you by your health care provider. Make sure you discuss any questions you have with your health care provider.

## 2015-02-24 NOTE — Assessment & Plan Note (Addendum)
Brittany [name] is a 81 y.o. female presents for follow-up on hypertension - Hypertension: Patient is stable, blood pressure is good and well controlled on current regimen. Continue lisinopril/HCTZ 20-12 0.5. Continue to eat low-salt diet. Get plenty of exercise. 4-6 month f/u.

## 2015-02-27 ENCOUNTER — Telehealth: Payer: Self-pay | Admitting: Family Medicine

## 2015-02-27 ENCOUNTER — Other Ambulatory Visit: Payer: Self-pay | Admitting: Family Medicine

## 2015-02-27 DIAGNOSIS — I10 Essential (primary) hypertension: Secondary | ICD-10-CM

## 2015-02-27 MED ORDER — LISINOPRIL 20 MG PO TABS: 20.0000 mg | ORAL_TABLET | Freq: Every day | ORAL | Status: DC

## 2015-02-27 NOTE — Telephone Encounter (Signed)
Attempted to call patient today concerning her lab results. Her sodium and chloride were low, this could be side effects from some of the medications that she is taking and I need to speak with her in more detail concerning this matter. Patient is on desmopressin and HCTZ, both which could be causes of hyponatremia. Patient did not complain of symptoms during exam, but we will need to discuss her medication use, and possible symptoms in more detail. Will attempt to call patient back at another time, was unable to leave a message secondary to no voicemail.

## 2015-02-27 NOTE — Telephone Encounter (Signed)
Pt LMOM requesting rf of lisinopril/hctz but she wants enough rf's for one year.  Please advise.

## 2015-02-27 NOTE — Telephone Encounter (Signed)
Was able to reach patient today to speak with her about her medications and hyponatremia. At this time she is desiring to remove the HCTZ portion of her blood pressure, and use an ace inhibitor only. She will need to have repeat labs and blood pressure check 1 week after starting the medication. Also discussed the desmopressin use may be causing hyponatremia as well. Patient takes 0.2 mg of desmopressin a day, and basilar care 10 mg a day for her overactive bladder. She states this is than her regimen for many years, and has not been reevaluated by urologist. Discussed with her that in her follow-up appointment in 2 months, we will place a urology referral for her to speak to a specialist about her overactive bladder and treatment. Discussed anticholinergic and desmopressin may not be ideal for her at her age, long-term. Patient opened to urology appointment, in 2-3 months, and she despite a new vial of desmopressin and does not want wasted. Explained to her as long as we can control her hyponatremia, and it doesn't worsen or she becomes symptomatic we can wait until 2 months to send her to urology.

## 2015-02-27 NOTE — Addendum Note (Signed)
Addended by: Howard Pouch A on: 02/27/2015 04:51 PM   Modules accepted: Orders, Medications

## 2015-02-28 DIAGNOSIS — M5440 Lumbago with sciatica, unspecified side: Secondary | ICD-10-CM | POA: Diagnosis not present

## 2015-02-28 DIAGNOSIS — M9903 Segmental and somatic dysfunction of lumbar region: Secondary | ICD-10-CM | POA: Diagnosis not present

## 2015-02-28 DIAGNOSIS — M9913 Subluxation complex (vertebral) of lumbar region: Secondary | ICD-10-CM | POA: Diagnosis not present

## 2015-02-28 DIAGNOSIS — M9904 Segmental and somatic dysfunction of sacral region: Secondary | ICD-10-CM | POA: Diagnosis not present

## 2015-02-28 DIAGNOSIS — M545 Low back pain: Secondary | ICD-10-CM | POA: Diagnosis not present

## 2015-02-28 DIAGNOSIS — M9914 Subluxation complex (vertebral) of sacral region: Secondary | ICD-10-CM | POA: Diagnosis not present

## 2015-03-03 ENCOUNTER — Encounter: Payer: Self-pay | Admitting: Internal Medicine

## 2015-03-03 DIAGNOSIS — M545 Low back pain: Secondary | ICD-10-CM | POA: Diagnosis not present

## 2015-03-03 DIAGNOSIS — M9903 Segmental and somatic dysfunction of lumbar region: Secondary | ICD-10-CM | POA: Diagnosis not present

## 2015-03-03 DIAGNOSIS — M9914 Subluxation complex (vertebral) of sacral region: Secondary | ICD-10-CM | POA: Diagnosis not present

## 2015-03-03 DIAGNOSIS — M9904 Segmental and somatic dysfunction of sacral region: Secondary | ICD-10-CM | POA: Diagnosis not present

## 2015-03-03 DIAGNOSIS — M9913 Subluxation complex (vertebral) of lumbar region: Secondary | ICD-10-CM | POA: Diagnosis not present

## 2015-03-03 DIAGNOSIS — M5440 Lumbago with sciatica, unspecified side: Secondary | ICD-10-CM | POA: Diagnosis not present

## 2015-03-06 DIAGNOSIS — M5440 Lumbago with sciatica, unspecified side: Secondary | ICD-10-CM | POA: Diagnosis not present

## 2015-03-06 DIAGNOSIS — M9903 Segmental and somatic dysfunction of lumbar region: Secondary | ICD-10-CM | POA: Diagnosis not present

## 2015-03-06 DIAGNOSIS — M545 Low back pain: Secondary | ICD-10-CM | POA: Diagnosis not present

## 2015-03-06 DIAGNOSIS — M9914 Subluxation complex (vertebral) of sacral region: Secondary | ICD-10-CM | POA: Diagnosis not present

## 2015-03-06 DIAGNOSIS — M9913 Subluxation complex (vertebral) of lumbar region: Secondary | ICD-10-CM | POA: Diagnosis not present

## 2015-03-06 DIAGNOSIS — M9904 Segmental and somatic dysfunction of sacral region: Secondary | ICD-10-CM | POA: Diagnosis not present

## 2015-03-13 DIAGNOSIS — M545 Low back pain: Secondary | ICD-10-CM | POA: Diagnosis not present

## 2015-03-13 DIAGNOSIS — M47816 Spondylosis without myelopathy or radiculopathy, lumbar region: Secondary | ICD-10-CM | POA: Diagnosis not present

## 2015-03-14 ENCOUNTER — Telehealth: Payer: Self-pay | Admitting: Family Medicine

## 2015-03-14 NOTE — Telephone Encounter (Signed)
Patient aware.  She reports no symptoms.  She will continue to monitor BP daily.

## 2015-03-14 NOTE — Telephone Encounter (Signed)
Please advise 

## 2015-03-14 NOTE — Telephone Encounter (Signed)
Pt called with her B/P's from yesterday and today. 8/22 9:15am 142/80  10:45  142/85  4pm  155/80  8/23 8a  140/64  Please call and advise if she needs to come in.

## 2015-03-14 NOTE — Telephone Encounter (Signed)
If patient is experiencing symptoms (headache, visual changes, dizziness, nose bleeds) then she should come in to be seen as soon as possible. If she is not having symptoms, she should check her BP once a day only, take medications as directed and if still elevated above 140/80 Monday she should make an appointment to discuss. - The high end of "ok" without symptoms for someone her age without kidney disease or diabetes is 150/90 (which she has neither). -  Ideally I would like her to maintain below 140/80 though, so if she is consistently seeing above this value we need to make changes.  Thanks.

## 2015-03-20 ENCOUNTER — Telehealth: Payer: Self-pay | Admitting: Internal Medicine

## 2015-03-20 DIAGNOSIS — M545 Low back pain: Secondary | ICD-10-CM | POA: Diagnosis not present

## 2015-03-20 DIAGNOSIS — M47816 Spondylosis without myelopathy or radiculopathy, lumbar region: Secondary | ICD-10-CM | POA: Diagnosis not present

## 2015-03-20 NOTE — Telephone Encounter (Signed)
Instructed pt to send transmission for review, aware that we will call back this afternoon. Pt confirmed that she feels fine but notices when her HR is elevated.

## 2015-03-20 NOTE — Telephone Encounter (Signed)
New message     Pt has a rapid heart rate but not constant. Pt states it will go up into the 100's and then go back down to the 70's Pt states no other symptoms Pt wanting to know if she should also send a transmission to see if her device has detected anything

## 2015-03-20 NOTE — Telephone Encounter (Signed)
LMOM that no episodes were detected on pacer. Gave return # to call back if elevated HR continues or if she develops symptoms.

## 2015-03-28 ENCOUNTER — Other Ambulatory Visit: Payer: Self-pay | Admitting: Family Medicine

## 2015-03-28 ENCOUNTER — Other Ambulatory Visit (INDEPENDENT_AMBULATORY_CARE_PROVIDER_SITE_OTHER): Payer: Medicare Other

## 2015-03-28 ENCOUNTER — Ambulatory Visit (INDEPENDENT_AMBULATORY_CARE_PROVIDER_SITE_OTHER): Payer: Medicare Other | Admitting: Family Medicine

## 2015-03-28 ENCOUNTER — Encounter: Payer: Self-pay | Admitting: Family Medicine

## 2015-03-28 ENCOUNTER — Telehealth: Payer: Self-pay | Admitting: Family Medicine

## 2015-03-28 VITALS — BP 170/82 | HR 71 | Temp 97.9°F | Resp 16 | Ht 66.0 in | Wt 187.0 lb

## 2015-03-28 DIAGNOSIS — I10 Essential (primary) hypertension: Secondary | ICD-10-CM | POA: Diagnosis not present

## 2015-03-28 DIAGNOSIS — E871 Hypo-osmolality and hyponatremia: Secondary | ICD-10-CM | POA: Insufficient documentation

## 2015-03-28 LAB — BASIC METABOLIC PANEL
BUN: 12 mg/dL (ref 6–23)
CO2: 31 mEq/L (ref 19–32)
Calcium: 10.6 mg/dL — ABNORMAL HIGH (ref 8.4–10.5)
Chloride: 97 mEq/L (ref 96–112)
Creatinine, Ser: 0.59 mg/dL (ref 0.40–1.20)
GFR: 104.53 mL/min (ref >60.00)
Glucose, Bld: 86 mg/dL (ref 70–99)
Potassium: 4.9 mEq/L (ref 3.5–5.1)
Sodium: 133 mEq/L — ABNORMAL LOW (ref 135–145)

## 2015-03-28 MED ORDER — LISINOPRIL 30 MG PO TABS: 30.0000 mg | ORAL_TABLET | Freq: Every day | ORAL | Status: DC

## 2015-03-28 NOTE — Telephone Encounter (Signed)
Please call pt, I have received a refill request for her BP medication. I had requested she make a follow up a week or so after starting new new medication for repeat BMP and BP re-check. I do not see a visit scheduled for her. There is a phone note, askin gid he needed to come in, at which I said not urgently but ay her regular scheduled follow up (which she may have assumed meant a few months from now) but I meant within the few weeks of starting the medication as requested. Please have her schedule an appt to have a follow up on the changes we made and labs.  Thanks.

## 2015-03-28 NOTE — Progress Notes (Signed)
Pre visit review using our clinic review tool, if applicable. No additional management support is needed unless otherwise documented below in the visit note. 

## 2015-03-28 NOTE — Patient Instructions (Addendum)
Hyponatremia  Hyponatremia is when the amount of salt (sodium) in your blood is too low. When sodium levels are low, your cells will absorb extra water and swell. The swelling happens throughout the body, but it mostly affects the brain. Severe brain swelling (cerebral edema), seizures, or coma can happen.  CAUSES   Heart, kidney, or liver problems.  Thyroid problems.  Adrenal gland problems.  Severe vomiting and diarrhea.  Certain medicines or illegal drugs.  Dehydration.  Drinking too much water.  Low-sodium diet. SYMPTOMS   Nausea and vomiting.  Confusion.  Lethargy.  Agitation.  Headache.  Twitching or shaking (seizures).  Unconsciousness.  Appetite loss.  Muscle weakness and cramping. DIAGNOSIS  Hyponatremia is identified by a simple blood test. Your caregiver will perform a history and physical exam to try to find the cause and type of hyponatremia. Other tests may be needed to measure the amount of sodium in your blood and urine. TREATMENT  Treatment will depend on the cause.   Fluids may be given through the vein (IV).  Medicines may be used to correct the sodium imbalance. If medicines are causing the problem, they will need to be adjusted.  Water or fluid intake may be restricted to restore proper balance. The speed of correcting the sodium problem is very important. If the problem is corrected too fast, nerve damage (sometimes unchangeable) can happen. HOME CARE INSTRUCTIONS   Only take medicines as directed by your caregiver. Many medicines can make hyponatremia worse. Discuss all your medicines with your caregiver.  Carefully follow any recommended diet, including any fluid restrictions.  You may be asked to repeat lab tests. Follow these directions.  Avoid alcohol and recreational drugs. SEEK MEDICAL CARE IF:   You develop worsening nausea, fatigue, headache, confusion, or weakness.  Your original hyponatremia symptoms return.  You have  problems following the recommended diet. SEEK IMMEDIATE MEDICAL CARE IF:   You have a seizure.  You faint.  You have ongoing diarrhea or vomiting. MAKE SURE YOU:   Understand these instructions.  Will watch your condition.  Will get help right away if you are not doing well or get worse. Document Released: 06/28/2002 Document Revised: 09/30/2011 Document Reviewed: 12/23/2010 [facility] Patient Information 2015 [place], [place]. This information is not intended to replace advice given to you by your health care provider. Make sure you discuss any questions you have with your health care provider.     Goal below 140/90 with BP, will need to follow up if not seeing BP below this at home. Otherwise 3 month Have FUN at the beach!!!

## 2015-03-28 NOTE — Telephone Encounter (Signed)
Patient aware.  She scheduled appt 03/29/15 @ 2pm.

## 2015-03-28 NOTE — Progress Notes (Signed)
Subjective:    Patient ID: Brittany [name], female    DOB: 05/08/1934, 81 y.o.   MRN: [id number]  HPI  Hypertension: Patient presents for follow-up on her hypertension. She has been seeing blood pressures at home mildly above goal since change of medication. Discontinued HCTZ portion of her lisinopril, secondary to hyponatremia. Patient's blood pressure in office today is above goal. Patient denies any headache, visual changes, chest pain, orthopnea, lower extremity edema or dizziness. Admit patient to call and her cardiology office with feelings of racing heart, her pacemaker transmission was reviewed in did not see any events. Patient denies any current racing heart/palpitations.  Hyponatremia: Incidentally found on BMP. Patient denies any symptoms of nausea, vomiting, confusion, headache or increase in muscle weakness or cramping. Discussion with patient surrounding hyponatremia and causes. Patient was on HCTZ and desmopressin, both of which can cause some hyponatremia. HCTZ has been discontinued over 3 weeks ago. Patient desires to sound desmopressin if able.  Past Medical History  Diagnosis Date  . Urine incontinence     takes Vesicare daily  . Urinary incontinence   . Atrial fibrillation 03/04/2013    takes Betapace daily  . Sinus node dysfunction 03/04/2013  . Pacemaker-Medtronic 03/04/2013  . Osteoporosis   . Depression     takes Celexa daily  . Hypothyroidism     takes Synthroid daily  . Hypertension     takes Amlodipine and Lisinopril-HCTZ  daily  . GERD (gastroesophageal reflux disease)     takes OMeprazole daily  . Joint swelling   . History of colon polyps     benign  . Urinary frequency   . Urinary urgency   . Nocturia   . Macular degeneration     wet in the right and dry in the left  . OSA on CPAP     sleep study in epic  . Arthritis     "back" (08/30/2014)   Allergies  Allergen Reactions  . Adhesive [Tape] Swelling and Rash  . Neosporin  [Neomycin-Polymyxin-Gramicidin] Swelling and Rash   Social History   Social History  . Marital Status: Married    Spouse Name: N/A  . Number of Children: 4  . Years of Education: 12   Occupational History  . [employer] Manager      retired   Social History Main Topics  . Smoking status: Former Smoker -- 1.00 packs/day for 45 years    Types: Cigarettes  . Smokeless tobacco: Never Used     Comment: quit smoking in 1992  . Alcohol Use: 16.8 oz/week    28 Glasses of wine per week     Comment: 08/30/2014 "16oz wine q night"  . Drug Use: No  . Sexual Activity: Yes    Birth Control/ Protection: Surgical   Other Topics Concern  . Not on file   Social History Narrative   Ms. Snavely lives with her husband at [employer]. They are retired and recently relocated to [facility] from [place]. She has 4 grown children.    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     Review of Systems Negative, with the exception of above mentioned in HPI     Objective:   Physical Exam BP 170/90 mmHg  Pulse 71  Temp(Src) 97.9 F (36.6 C) (Oral)  Resp 16  Ht 5\' 6"  (1.676 m)  Wt 187 lb (84.823 kg)  BMI 30.20 kg/m2  SpO2 97%  LMP 07/22/1968 Gen: Afebrile. No acute distress.  Nontoxic in appearance. Well-developed, well-nourished, Caucasian female. HENT: AT. [place]. MMM.  Eyes:Pupils Equal Round Reactive to light, Extraocular movements intact, Conjunctiva without redness, discharge or icterus. CV: RRR no murmur, no edema, +2/4 P posterior tibialis pulses Chest: CTAB, no wheeze or crackles     Assessment & Plan:  1. Hyponatremia - Discontinued HCTZ, desmopressin may also be a factor that will need to be addressed later date. Patient is asymptomatic. - BMP collected.  2. Essential hypertension, benign - Above goal, even on repeat. Patient was encouraged to take blood pressure at home every day, if blood pressure is above 140/80 after starting increased dose of  lisinopril, she will need to come in to be seen sooner. - If her blood pressure is below 140/80 and she is asymptomatic, then we'll follow-up in 3 months. - Lisinopril increased to 30 mg daily, this has been called into her pharmacy. - Follow-up 3 months, or sooner if not at goal at home checks.

## 2015-03-29 ENCOUNTER — Telehealth: Payer: Self-pay | Admitting: Family Medicine

## 2015-03-29 ENCOUNTER — Ambulatory Visit: Payer: Medicare Other | Admitting: Family Medicine

## 2015-03-29 NOTE — Telephone Encounter (Signed)
Please call pt, her sodium level has improved with stopping HCTZ. Her calcium level is mildly elevated by her lab (this could be her normal, about a year ago it was mildly elevated as well). I will get a repeat calcium level when we do her TSH level in a few months, to make certain it does not need further work up. Remind her to continue to monitor her BP levels at home and report back to me if they are above goal (above goal is >140/90).  - Please advise her to have her CMP and TSH completed about 1 week prior to her 3 month follow up visit, and we can cover all problems at that office visit and go over lab results. Thanks.

## 2015-03-29 NOTE — Telephone Encounter (Signed)
LMOM for pt to CB.  

## 2015-03-29 NOTE — Telephone Encounter (Signed)
Patient advised of lab results.  She states her calcium has always been elevated.  She scheduled lab appt one week prior to next OV.  Patient will contact us if BP gets above goal.

## 2015-04-12 ENCOUNTER — Emergency Department (HOSPITAL_BASED_OUTPATIENT_CLINIC_OR_DEPARTMENT_OTHER)
Admission: EM | Admit: 2015-04-12 | Discharge: 2015-04-12 | Disposition: A | Discharge status: Home / Self Care | Payer: Medicare Other | Source: Home / Self Care | Attending: Emergency Medicine | Admitting: Emergency Medicine

## 2015-04-12 ENCOUNTER — Encounter (HOSPITAL_BASED_OUTPATIENT_CLINIC_OR_DEPARTMENT_OTHER): Payer: Self-pay

## 2015-04-12 ENCOUNTER — Emergency Department (HOSPITAL_BASED_OUTPATIENT_CLINIC_OR_DEPARTMENT_OTHER): Payer: Medicare Other

## 2015-04-12 DIAGNOSIS — S82842A Displaced bimalleolar fracture of left lower leg, initial encounter for closed fracture: Secondary | ICD-10-CM | POA: Diagnosis not present

## 2015-04-12 DIAGNOSIS — S82892A Other fracture of left lower leg, initial encounter for closed fracture: Secondary | ICD-10-CM

## 2015-04-12 DIAGNOSIS — S82832A Other fracture of upper and lower end of left fibula, initial encounter for closed fracture: Secondary | ICD-10-CM | POA: Diagnosis not present

## 2015-04-12 DIAGNOSIS — S82392A Other fracture of lower end of left tibia, initial encounter for closed fracture: Secondary | ICD-10-CM | POA: Diagnosis not present

## 2015-04-12 MED ORDER — HYDROCODONE-ACETAMINOPHEN 5-325 MG PO TABS: 1.0000 | ORAL_TABLET | Freq: Four times a day (QID) | ORAL | Status: DC | PRN

## 2015-04-12 MED ORDER — HYDROCODONE-ACETAMINOPHEN 5-325 MG PO TABS
1.0000 | ORAL_TABLET | Freq: Once | ORAL | Status: AC
  Administered 2015-04-12: 1 via ORAL
  Filled 2015-04-12: qty 1

## 2015-04-12 NOTE — ED Notes (Signed)
Pt fell on wet ramp-pain to left ankle-pt assisted from car to w/c to triage

## 2015-04-12 NOTE — ED Notes (Signed)
Patient transported to X-ray 

## 2015-04-12 NOTE — ED Provider Notes (Signed)
CSN: [id number]     Arrival date & time 04/12/15  1330 History   First MD Initiated Contact with Patient 04/12/15 1343     Chief Complaint  Patient presents with  . Fall     (Consider location/radiation/quality/duration/timing/severity/associated sxs/prior Treatment) Patient is a 81 y.o. female presenting with ankle pain.  Ankle Pain Location:  Ankle Ankle location:  R ankle Pain details:    Quality:  Aching and sharp   Severity:  Mild   Onset quality:  Sudden   Timing:  Constant   Past Medical History  Diagnosis Date  . Urine incontinence     takes Vesicare daily  . Urinary incontinence   . Atrial fibrillation 03/04/2013    takes Betapace daily  . Sinus node dysfunction 03/04/2013  . Pacemaker-Medtronic 03/04/2013  . Osteoporosis   . Depression     takes Celexa daily  . Hypothyroidism     takes Synthroid daily  . Hypertension     takes Amlodipine and Lisinopril-HCTZ  daily  . GERD (gastroesophageal reflux disease)     takes OMeprazole daily  . Joint swelling   . History of colon polyps     benign  . Urinary frequency   . Urinary urgency   . Nocturia   . Macular degeneration     wet in the right and dry in the left  . OSA on CPAP     sleep study in epic  . Arthritis     "back" (08/30/2014)   Past Surgical History  Procedure Laterality Date  . Tonsillectomy and adenoidectomy    . Pacemaker placement Right 2012  . Cataract extraction    . Colonoscopy    . Esophagogastroduodenoscopy    . Shoulder arthroscopy w/ rotator cuff repair Right 08/30/2014    WITH MINI-OPEN ROTATOR CUFF REPAIR AND SUBACROMIAL DECOMPRESSION  . Laparoscopic cholecystectomy  08/11/1999  . Insert / replace / remove pacemaker    . Lumbar disc surgery  02/2014  . Back surgery    . Vaginal hysterectomy  1970  . Cataract extraction w/ intraocular lens  implant, bilateral Bilateral   . Shoulder arthroscopy with rotator cuff repair and subacromial decompression Right 08/30/2014    Procedure:  SHOULDER ARTHROSCOPY WITH MINI-OPEN ROTATOR CUFF REPAIR AND SUBACROMIAL DECOMPRESSION;  Surgeon: Meredith Pel, MD;  Location: [facility];  Service: Orthopedics;  Laterality: Right;  RIGHT SHOULDER DIAGNOSTIC OPERATIVE ARTHROSCOPY, SUBACROMIAL DECOMPRESSION, MINI-OPEN ROTATOR CUFF REPAIR.   Family History  Problem Relation Age of Onset  . Cancer Mother     colon  . Cancer Father     unknown type  . Stroke Sister   . Skin cancer Brother   . Cancer Paternal Aunt     breast  . Heart disease Brother   . Asthma Brother    Social History  Substance Use Topics  . Smoking status: Former Smoker -- 1.00 packs/day for 45 years    Types: Cigarettes  . Smokeless tobacco: Never Used     Comment: quit smoking in 1992  . Alcohol Use: 16.8 oz/week    28 Glasses of wine per week     Comment: 08/30/2014 "16oz wine q night"   OB History    No data available     Review of Systems  Musculoskeletal: Positive for joint swelling (left ankle).  All other systems reviewed and are negative.     Allergies  Adhesive and Neosporin  Home Medications   Prior to Admission medications   Medication Sig Start  Date End Date Taking? Authorizing Provider  aspirin 81 MG tablet Take 81 mg by mouth daily.    Historical Provider, MD  B Complex-C (B-COMPLEX WITH VITAMIN C) tablet Take 1 tablet by mouth daily.    Historical Provider, MD  Cholecalciferol (VITAMIN D3) 5000 UNITS CAPS Take 1 Can by mouth daily.    Historical Provider, MD  citalopram (CELEXA) 20 MG tablet Take 1 tablet (20 mg total) by mouth daily. 07/28/14   Irene Pap, NP  desmopressin (DDAVP) 0.2 MG tablet Take 1 tablet (0.2 mg total) by mouth daily. 01/05/15   Irene Pap, NP  EPINEPHrine 0.3 mg/0.3 mL IJ SOAJ injection Inject 0.3 mLs (0.3 mg total) into the muscle once. 04/01/14   Dorie Rank, MD  levothyroxine (SYNTHROID, LEVOTHROID) 25 MCG tablet Take 1 tablet (25 mcg total) by mouth daily before breakfast. Patient not taking: Reported on  03/28/2015 11/07/14   Irene Pap, NP  lisinopril (PRINIVIL,ZESTRIL) 30 MG tablet Take 1 tablet (30 mg total) by mouth daily. 03/28/15   Renee A Kuneff, DO  magnesium oxide (MAG-OX) 400 MG tablet Take 1 tablet (400 mg total) by mouth daily. 03/21/14   Deboraha Sprang, MD  Misc Natural Products (GLUCOSAMINE CHONDROITIN MSM PO) Take 1,500 mg by mouth 2 (two) times daily.    Historical Provider, MD  Multiple Vitamin (MULTIVITAMIN WITH MINERALS) TABS tablet Take 1 tablet by mouth daily.    Historical Provider, MD  Multiple Vitamins-Minerals (ICAPS AREDS FORMULA PO) Take 1 capsule by mouth 2 (two) times daily.    Historical Provider, MD  Omega-3 Fatty Acids (FISH OIL) 1200 MG CAPS Take 1 capsule by mouth 2 (two) times daily.    Historical Provider, MD  omeprazole (PRILOSEC) 20 MG capsule Take 1 capsule (20 mg total) by mouth daily. 07/11/14   Irene Pap, NP  solifenacin (VESICARE) 10 MG tablet Take 1 tablet (10 mg total) by mouth daily. 01/05/15   Irene Pap, NP  sotalol (BETAPACE) 80 MG tablet TAKE ONE AND ONE-HALF TABLETS (120 MG TOTAL) BY MOUTH TWICE DAILY 02/06/15   Deboraha Sprang, MD   BP 201/82 mmHg  Pulse 66  Temp(Src) 98.2 F (36.8 C) (Oral)  Resp 16  Ht 5\' 8"  (1.727 m)  Wt 187 lb (84.823 kg)  BMI 28.44 kg/m2  SpO2 100%  LMP 07/22/1968 Physical Exam  Constitutional: She is oriented to person, place, and time. She appears well-developed and well-nourished.  HENT:  Head: Normocephalic and atraumatic.  Eyes: Conjunctivae and EOM are normal. Right eye exhibits no discharge. Left eye exhibits no discharge.  Cardiovascular: Normal rate and regular rhythm.   Pulmonary/Chest: Effort normal and breath sounds normal. No respiratory distress.  Abdominal: Soft. She exhibits no distension. There is no tenderness. There is no rebound.  Musculoskeletal: Normal range of motion. She exhibits edema (left ankle) and tenderness.  Neurological: She is alert and oriented to person, place, and time.   Skin: Skin is warm and dry.  Nursing note and vitals reviewed.   ED Course  Reduction of fracture Date/Time: 04/12/2015 4:08 PM Performed by: Merrily Pew Authorized by: Merrily Pew Consent: Verbal consent obtained. Risks and benefits: risks, benefits and alternatives were discussed Consent given by: patient Local anesthesia used: no Patient sedated: no Patient tolerance: Patient tolerated the procedure well with no immediate complications Comments: Slight subluxation of left ankle, reduced with improved alignment.    (including critical care time) Labs Review Labs Reviewed - No data to display  Imaging Review Dg Ankle Complete Left  04/12/2015   CLINICAL DATA:  Medial ankle pain after falling yesterday. Initial encounter.  EXAM: LEFT ANKLE COMPLETE - 3+ VIEW  COMPARISON:  None.  FINDINGS: The bones are demineralized. There are comminuted and laterally displaced fractures of both malleoli. There is lateral subluxation of the talus with respect to the tibial plafond. No frank dislocation or tarsal bone fracture identified. There is moderate soft tissue swelling around the ankle, greatest laterally.  IMPRESSION: Displaced intra-articular fractures of the distal tibia and fibula as described with resulting lateral subluxation of the talus.   Electronically Signed   By: Richardean Sale M.D.   On: 04/12/2015 14:01   I have personally reviewed and evaluated these images and lab results as part of my medical decision-making.   EKG Interpretation None      MDM   Final diagnoses:  Closed left ankle fracture, initial encounter    81 year old female with a slip and fall resulting left tibia fibula fracture involving the articular surfaces. Slight subluxation. Attempted reduction however would not stay. Not grossly dislocated. Discussed with Dr. Randel Pigg office, her orthopedic surgeon, and he can see her in the office tomorrow morning. Patient splinted and given crutches and pain medicine  will follow-up in the morning. No other obvious traumatic injuries on exam.  I have personally and contemperaneously reviewed labs and imaging and used in my decision making as above.   A medical screening exam was performed and I feel the patient has had an appropriate workup for their chief complaint at this time and likelihood of emergent condition existing is low. They have been counseled on decision, discharge, follow up and which symptoms necessitate immediate return to the emergency department. They or their family verbally stated understanding and agreement with plan and discharged in stable condition.      Merrily Pew, MD 04/12/15 [phone]

## 2015-04-12 NOTE — ED Notes (Signed)
Patient transported to x-ray. ?

## 2015-04-13 ENCOUNTER — Inpatient Hospital Stay (HOSPITAL_COMMUNITY)
Admission: AD | Admit: 2015-04-13 | Discharge: 2015-04-15 | DRG: 494 | Disposition: A | Discharge status: Home Health | Payer: Medicare Other | Source: Ambulatory Visit | Attending: Orthopedic Surgery | Admitting: Orthopedic Surgery

## 2015-04-13 ENCOUNTER — Ambulatory Visit (HOSPITAL_COMMUNITY): Payer: Medicare Other | Admitting: Anesthesiology

## 2015-04-13 ENCOUNTER — Other Ambulatory Visit (HOSPITAL_COMMUNITY): Payer: Self-pay | Admitting: Orthopedic Surgery

## 2015-04-13 ENCOUNTER — Ambulatory Visit (HOSPITAL_COMMUNITY): Payer: Medicare Other

## 2015-04-13 ENCOUNTER — Encounter (HOSPITAL_COMMUNITY)
Admission: AD | Disposition: A | Discharge status: Home Health | Payer: Self-pay | Source: Ambulatory Visit | Attending: Orthopedic Surgery

## 2015-04-13 ENCOUNTER — Encounter (HOSPITAL_COMMUNITY): Payer: Self-pay | Admitting: General Practice

## 2015-04-13 DIAGNOSIS — Z808 Family history of malignant neoplasm of other organs or systems: Secondary | ICD-10-CM | POA: Diagnosis not present

## 2015-04-13 DIAGNOSIS — G4733 Obstructive sleep apnea (adult) (pediatric): Secondary | ICD-10-CM | POA: Diagnosis present

## 2015-04-13 DIAGNOSIS — Z825 Family history of asthma and other chronic lower respiratory diseases: Secondary | ICD-10-CM

## 2015-04-13 DIAGNOSIS — Z8249 Family history of ischemic heart disease and other diseases of the circulatory system: Secondary | ICD-10-CM | POA: Diagnosis not present

## 2015-04-13 DIAGNOSIS — S82842B Displaced bimalleolar fracture of left lower leg, initial encounter for open fracture type I or II: Secondary | ICD-10-CM | POA: Diagnosis not present

## 2015-04-13 DIAGNOSIS — I10 Essential (primary) hypertension: Secondary | ICD-10-CM | POA: Diagnosis not present

## 2015-04-13 DIAGNOSIS — G8918 Other acute postprocedural pain: Secondary | ICD-10-CM | POA: Diagnosis not present

## 2015-04-13 DIAGNOSIS — M1990 Unspecified osteoarthritis, unspecified site: Secondary | ICD-10-CM | POA: Diagnosis present

## 2015-04-13 DIAGNOSIS — I4891 Unspecified atrial fibrillation: Secondary | ICD-10-CM | POA: Diagnosis present

## 2015-04-13 DIAGNOSIS — S82899A Other fracture of unspecified lower leg, initial encounter for closed fracture: Secondary | ICD-10-CM | POA: Diagnosis present

## 2015-04-13 DIAGNOSIS — Z950 Presence of cardiac pacemaker: Secondary | ICD-10-CM | POA: Diagnosis not present

## 2015-04-13 DIAGNOSIS — Z823 Family history of stroke: Secondary | ICD-10-CM | POA: Diagnosis not present

## 2015-04-13 DIAGNOSIS — H3530 Unspecified macular degeneration: Secondary | ICD-10-CM | POA: Diagnosis present

## 2015-04-13 DIAGNOSIS — K219 Gastro-esophageal reflux disease without esophagitis: Secondary | ICD-10-CM | POA: Diagnosis present

## 2015-04-13 DIAGNOSIS — Z87891 Personal history of nicotine dependence: Secondary | ICD-10-CM

## 2015-04-13 DIAGNOSIS — W19XXXA Unspecified fall, initial encounter: Secondary | ICD-10-CM | POA: Diagnosis not present

## 2015-04-13 DIAGNOSIS — S82842A Displaced bimalleolar fracture of left lower leg, initial encounter for closed fracture: Secondary | ICD-10-CM | POA: Diagnosis not present

## 2015-04-13 DIAGNOSIS — I739 Peripheral vascular disease, unspecified: Secondary | ICD-10-CM | POA: Diagnosis present

## 2015-04-13 DIAGNOSIS — W010XXA Fall on same level from slipping, tripping and stumbling without subsequent striking against object, initial encounter: Secondary | ICD-10-CM | POA: Diagnosis present

## 2015-04-13 DIAGNOSIS — S82842D Displaced bimalleolar fracture of left lower leg, subsequent encounter for closed fracture with routine healing: Secondary | ICD-10-CM | POA: Diagnosis not present

## 2015-04-13 DIAGNOSIS — M810 Age-related osteoporosis without current pathological fracture: Secondary | ICD-10-CM | POA: Diagnosis present

## 2015-04-13 DIAGNOSIS — M25572 Pain in left ankle and joints of left foot: Secondary | ICD-10-CM | POA: Diagnosis not present

## 2015-04-13 DIAGNOSIS — S82892A Other fracture of left lower leg, initial encounter for closed fracture: Secondary | ICD-10-CM

## 2015-04-13 HISTORY — DX: Peripheral vascular disease, unspecified: I73.9

## 2015-04-13 HISTORY — PX: ORIF ANKLE FRACTURE: SHX5408

## 2015-04-13 LAB — BASIC METABOLIC PANEL
Anion gap: 8 (ref 5–15)
Anion gap: 6 (ref 5–15)
BUN: 7 mg/dL (ref 6–20)
BUN: <5 mg/dL — ABNORMAL LOW (ref 6–20)
CO2: 25 mmol/L (ref 22–32)
CO2: 29 mmol/L (ref 22–32)
Calcium: 9.8 mg/dL (ref 8.9–10.3)
Calcium: 9.8 mg/dL (ref 8.9–10.3)
Chloride: 93 mmol/L — ABNORMAL LOW (ref 101–111)
Chloride: 95 mmol/L — ABNORMAL LOW (ref 101–111)
Creatinine, Ser: 0.54 mg/dL (ref 0.44–1.00)
Creatinine, Ser: 0.56 mg/dL (ref 0.44–1.00)
GFR calc Af Amer: >60 mL/min (ref >60)
GFR calc Af Amer: >60 mL/min (ref >60)
GFR calc non Af Amer: >60 mL/min (ref >60)
GFR calc non Af Amer: >60 mL/min (ref >60)
Glucose, Bld: 101 mg/dL — ABNORMAL HIGH (ref 65–99)
Glucose, Bld: 107 mg/dL — ABNORMAL HIGH (ref 65–99)
Potassium: >7.5 mmol/L (ref 3.5–5.1)
Potassium: 4.2 mmol/L (ref 3.5–5.1)
Sodium: 126 mmol/L — ABNORMAL LOW (ref 135–145)
Sodium: 130 mmol/L — ABNORMAL LOW (ref 135–145)

## 2015-04-13 LAB — SURGICAL PCR SCREEN
MRSA, PCR: POSITIVE — AB
Staphylococcus aureus: POSITIVE — AB

## 2015-04-13 LAB — CBC
HCT: 42.9 % (ref 36.0–46.0)
Hemoglobin: 14.5 g/dL (ref 12.0–15.0)
MCH: 31.6 pg (ref 26.0–34.0)
MCHC: 33.8 g/dL (ref 30.0–36.0)
MCV: 93.5 fL (ref 78.0–100.0)
Platelets: 227 10*3/uL (ref 150–400)
RBC: 4.59 MIL/uL (ref 3.87–5.11)
RDW: 13.3 % (ref 11.5–15.5)
WBC: 7.3 10*3/uL (ref 4.0–10.5)

## 2015-04-13 SURGERY — OPEN REDUCTION INTERNAL FIXATION (ORIF) ANKLE FRACTURE
Anesthesia: Regional | Site: Ankle | Laterality: Left

## 2015-04-13 MED ORDER — BUPIVACAINE-EPINEPHRINE (PF) 0.5% -1:200000 IJ SOLN
INTRAMUSCULAR | Status: DC | PRN
  Administered 2015-04-13 (×2): 10 mL via PERINEURAL

## 2015-04-13 MED ORDER — CHLORHEXIDINE GLUCONATE 4 % EX LIQD: 60.0000 mL | Freq: Once | CUTANEOUS | Status: DC

## 2015-04-13 MED ORDER — ACETAMINOPHEN 325 MG PO TABS
650.0000 mg | ORAL_TABLET | Freq: Four times a day (QID) | ORAL | Status: DC | PRN
Start: 2015-04-13 — End: 2015-04-15
  Administered 2015-04-15: 650 mg via ORAL
  Filled 2015-04-13: qty 2

## 2015-04-13 MED ORDER — LACTATED RINGERS IV SOLN: INTRAVENOUS | Status: DC | PRN

## 2015-04-13 MED ORDER — SOTALOL HCL 80 MG PO TABS
80.0000 mg | ORAL_TABLET | Freq: Every day | ORAL | Status: DC
  Administered 2015-04-14 – 2015-04-15 (×2): 80 mg via ORAL
  Filled 2015-04-13 (×3): qty 1

## 2015-04-13 MED ORDER — MEPERIDINE HCL 25 MG/ML IJ SOLN: 6.2500 mg | INTRAMUSCULAR | Status: DC | PRN

## 2015-04-13 MED ORDER — VITAMIN D 1000 UNITS PO TABS
5000.0000 [IU] | ORAL_TABLET | Freq: Every day | ORAL | Status: DC
  Administered 2015-04-14 – 2015-04-15 (×2): 5000 [IU] via ORAL
  Filled 2015-04-13 (×2): qty 5

## 2015-04-13 MED ORDER — PROMETHAZINE HCL 25 MG/ML IJ SOLN: 6.2500 mg | INTRAMUSCULAR | Status: DC | PRN

## 2015-04-13 MED ORDER — PHENYLEPHRINE HCL 10 MG/ML IJ SOLN
INTRAMUSCULAR | Status: DC | PRN
  Administered 2015-04-13 (×2): 40 ug via INTRAVENOUS

## 2015-04-13 MED ORDER — DEXTROSE 5 % IV SOLN
10.0000 mg | INTRAVENOUS | Status: DC | PRN
  Administered 2015-04-13: 25 ug/min via INTRAVENOUS

## 2015-04-13 MED ORDER — WARFARIN - PHARMACIST DOSING INPATIENT
Freq: Every day | Status: DC
Start: 2015-04-14 — End: 2015-04-15
  Administered 2015-04-14: 18:00:00

## 2015-04-13 MED ORDER — MAGNESIUM OXIDE 400 (241.3 MG) MG PO TABS
400.0000 mg | ORAL_TABLET | Freq: Every day | ORAL | Status: DC
  Administered 2015-04-14 – 2015-04-15 (×2): 400 mg via ORAL
  Filled 2015-04-13 (×2): qty 1

## 2015-04-13 MED ORDER — OXYCODONE HCL 5 MG PO TABS
5.0000 mg | ORAL_TABLET | ORAL | Status: DC | PRN
  Administered 2015-04-14 – 2015-04-15 (×7): 10 mg via ORAL
  Filled 2015-04-13 (×4): qty 2
  Filled 2015-04-13: qty 1
  Filled 2015-04-13 (×4): qty 2

## 2015-04-13 MED ORDER — CITALOPRAM HYDROBROMIDE 20 MG PO TABS
20.0000 mg | ORAL_TABLET | Freq: Every day | ORAL | Status: DC
  Administered 2015-04-14 – 2015-04-15 (×2): 20 mg via ORAL
  Filled 2015-04-13 (×2): qty 1

## 2015-04-13 MED ORDER — FENTANYL CITRATE (PF) 250 MCG/5ML IJ SOLN
INTRAMUSCULAR | Status: DC | PRN
  Administered 2015-04-13 (×2): 50 ug via INTRAVENOUS

## 2015-04-13 MED ORDER — COUMADIN BOOK
Freq: Once | Status: AC
  Administered 2015-04-14: 12:00:00
  Filled 2015-04-13 (×2): qty 1

## 2015-04-13 MED ORDER — CHLORHEXIDINE GLUCONATE CLOTH 2 % EX PADS: 6.0000 | MEDICATED_PAD | Freq: Every day | CUTANEOUS | Status: DC

## 2015-04-13 MED ORDER — WARFARIN VIDEO
Freq: Once | Status: AC
Start: 2015-04-14 — End: 2015-04-14
  Administered 2015-04-14: 13:00:00

## 2015-04-13 MED ORDER — VANCOMYCIN HCL IN DEXTROSE 1-5 GM/200ML-% IV SOLN
1000.0000 mg | Freq: Two times a day (BID) | INTRAVENOUS | Status: AC
  Administered 2015-04-14: 1000 mg via INTRAVENOUS
  Filled 2015-04-13: qty 200

## 2015-04-13 MED ORDER — OCUVITE-LUTEIN PO CAPS
1.0000 | ORAL_CAPSULE | Freq: Every day | ORAL | Status: DC
  Administered 2015-04-14 – 2015-04-15 (×2): 1 via ORAL
  Filled 2015-04-13 (×3): qty 1

## 2015-04-13 MED ORDER — ONDANSETRON HCL 4 MG/2ML IJ SOLN: 4.0000 mg | Freq: Four times a day (QID) | INTRAMUSCULAR | Status: DC | PRN

## 2015-04-13 MED ORDER — WARFARIN SODIUM 5 MG PO TABS
5.0000 mg | ORAL_TABLET | Freq: Once | ORAL | Status: AC
  Administered 2015-04-14: 5 mg via ORAL
  Filled 2015-04-13: qty 1

## 2015-04-13 MED ORDER — MUPIROCIN 2 % EX OINT
1.0000 "application " | TOPICAL_OINTMENT | Freq: Once | CUTANEOUS | Status: AC
  Administered 2015-04-13: 1 via TOPICAL

## 2015-04-13 MED ORDER — LACTATED RINGERS IV SOLN: INTRAVENOUS | Status: DC

## 2015-04-13 MED ORDER — B COMPLEX-C PO TABS
1.0000 | ORAL_TABLET | Freq: Every day | ORAL | Status: DC
  Administered 2015-04-14 – 2015-04-15 (×2): 1 via ORAL
  Filled 2015-04-13 (×3): qty 1

## 2015-04-13 MED ORDER — MUPIROCIN CALCIUM 2 % EX CREA
TOPICAL_CREAM | CUTANEOUS | Status: AC
  Filled 2015-04-13: qty 15

## 2015-04-13 MED ORDER — MAGNESIUM OXIDE 400 MG PO TABS: 400.0000 mg | ORAL_TABLET | Freq: Every day | ORAL | Status: DC

## 2015-04-13 MED ORDER — PROPOFOL 10 MG/ML IV BOLUS
INTRAVENOUS | Status: DC | PRN
  Administered 2015-04-13: 150 mg via INTRAVENOUS

## 2015-04-13 MED ORDER — SODIUM CHLORIDE 0.9 % IJ SOLN
INTRAMUSCULAR | Status: AC
  Filled 2015-04-13: qty 10

## 2015-04-13 MED ORDER — 0.9 % SODIUM CHLORIDE (POUR BTL) OPTIME
TOPICAL | Status: DC | PRN
  Administered 2015-04-13 (×3): 1000 mL

## 2015-04-13 MED ORDER — LACTATED RINGERS IV SOLN
INTRAVENOUS | Status: DC | PRN
  Administered 2015-04-13: 20:00:00 via INTRAVENOUS

## 2015-04-13 MED ORDER — ROPIVACAINE HCL 5 MG/ML IJ SOLN
INTRAMUSCULAR | Status: DC | PRN
  Administered 2015-04-13 (×2): 10 mL via PERINEURAL

## 2015-04-13 MED ORDER — ONDANSETRON HCL 4 MG/2ML IJ SOLN
INTRAMUSCULAR | Status: DC | PRN
  Administered 2015-04-13: 4 mg via INTRAVENOUS

## 2015-04-13 MED ORDER — LIDOCAINE HCL (CARDIAC) 20 MG/ML IV SOLN
INTRAVENOUS | Status: AC
  Filled 2015-04-13: qty 5

## 2015-04-13 MED ORDER — CEFAZOLIN SODIUM-DEXTROSE 2-3 GM-% IV SOLR
INTRAVENOUS | Status: AC
  Administered 2015-04-13: 2 g via INTRAVENOUS
  Filled 2015-04-13: qty 50

## 2015-04-13 MED ORDER — LISINOPRIL 20 MG PO TABS
30.0000 mg | ORAL_TABLET | Freq: Every day | ORAL | Status: DC
  Administered 2015-04-14 – 2015-04-15 (×2): 30 mg via ORAL
  Filled 2015-04-13 (×4): qty 1

## 2015-04-13 MED ORDER — VITAMIN D3 125 MCG (5000 UT) PO CAPS: ORAL_CAPSULE | Freq: Every day | ORAL | Status: DC

## 2015-04-13 MED ORDER — MUPIROCIN 2 % EX OINT
TOPICAL_OINTMENT | CUTANEOUS | Status: AC
  Filled 2015-04-13: qty 22

## 2015-04-13 MED ORDER — ADULT MULTIVITAMIN W/MINERALS CH
1.0000 | ORAL_TABLET | Freq: Every day | ORAL | Status: DC
  Administered 2015-04-14: 1 via ORAL
  Filled 2015-04-13: qty 1

## 2015-04-13 MED ORDER — METOCLOPRAMIDE HCL 5 MG PO TABS: 5.0000 mg | ORAL_TABLET | Freq: Three times a day (TID) | ORAL | Status: DC | PRN

## 2015-04-13 MED ORDER — EPHEDRINE SULFATE 50 MG/ML IJ SOLN
INTRAMUSCULAR | Status: AC
  Filled 2015-04-13: qty 1

## 2015-04-13 MED ORDER — DESMOPRESSIN ACETATE 0.2 MG PO TABS
0.2000 mg | ORAL_TABLET | Freq: Every day | ORAL | Status: DC
  Administered 2015-04-15: 0.2 mg via ORAL
  Filled 2015-04-13 (×3): qty 1

## 2015-04-13 MED ORDER — FENTANYL CITRATE (PF) 100 MCG/2ML IJ SOLN: 25.0000 ug | INTRAMUSCULAR | Status: DC | PRN

## 2015-04-13 MED ORDER — ONDANSETRON HCL 4 MG PO TABS: 4.0000 mg | ORAL_TABLET | Freq: Four times a day (QID) | ORAL | Status: DC | PRN

## 2015-04-13 MED ORDER — ACETAMINOPHEN 650 MG RE SUPP: 650.0000 mg | Freq: Four times a day (QID) | RECTAL | Status: DC | PRN

## 2015-04-13 MED ORDER — PANTOPRAZOLE SODIUM 40 MG PO TBEC
40.0000 mg | DELAYED_RELEASE_TABLET | Freq: Every day | ORAL | Status: DC
  Administered 2015-04-14 – 2015-04-15 (×2): 40 mg via ORAL
  Filled 2015-04-13 (×2): qty 1

## 2015-04-13 MED ORDER — METOCLOPRAMIDE HCL 5 MG/ML IJ SOLN: 5.0000 mg | Freq: Three times a day (TID) | INTRAMUSCULAR | Status: DC | PRN

## 2015-04-13 MED ORDER — LIDOCAINE HCL (CARDIAC) 20 MG/ML IV SOLN
INTRAVENOUS | Status: DC | PRN
  Administered 2015-04-13: 70 mg via INTRAVENOUS

## 2015-04-13 MED ORDER — ICAPS AREDS FORMULA PO TABS: ORAL_TABLET | Freq: Two times a day (BID) | ORAL | Status: DC

## 2015-04-13 MED ORDER — LACTATED RINGERS IV SOLN
INTRAVENOUS | Status: DC
  Administered 2015-04-13: 16:00:00 via INTRAVENOUS

## 2015-04-13 MED ORDER — PROPOFOL 10 MG/ML IV BOLUS
INTRAVENOUS | Status: AC
  Filled 2015-04-13: qty 20

## 2015-04-13 MED ORDER — FENTANYL CITRATE (PF) 250 MCG/5ML IJ SOLN
INTRAMUSCULAR | Status: AC
  Filled 2015-04-13: qty 5

## 2015-04-13 MED ORDER — CEFAZOLIN SODIUM-DEXTROSE 2-3 GM-% IV SOLR: 2.0000 g | INTRAVENOUS | Status: DC

## 2015-04-13 MED ORDER — MUPIROCIN 2 % EX OINT
1.0000 "application " | TOPICAL_OINTMENT | Freq: Two times a day (BID) | CUTANEOUS | Status: DC
  Administered 2015-04-14 – 2015-04-15 (×3): 1 via NASAL

## 2015-04-13 MED ORDER — DARIFENACIN HYDROBROMIDE ER 7.5 MG PO TB24
7.5000 mg | ORAL_TABLET | Freq: Every day | ORAL | Status: DC
  Administered 2015-04-14 – 2015-04-15 (×2): 7.5 mg via ORAL
  Filled 2015-04-13 (×4): qty 1

## 2015-04-13 SURGICAL SUPPLY — 74 items
BANDAGE ELASTIC 4 VELCRO ST LF (GAUZE/BANDAGES/DRESSINGS) ×2 IMPLANT
BANDAGE ELASTIC 6 VELCRO ST LF (GAUZE/BANDAGES/DRESSINGS) ×2 IMPLANT
BENZOIN TINCTURE PRP APPL 2/3 (GAUZE/BANDAGES/DRESSINGS) ×2 IMPLANT
BIT DRILL 2.7 QC CANN 155 (BIT) ×2 IMPLANT
BIT DRILL 3.5 QC 155 (BIT) ×2 IMPLANT
BIT DRILL QC 2.7 6.3IN  SHORT (BIT) ×1
BIT DRILL QC 2.7 6.3IN SHORT (BIT) ×1 IMPLANT
BLADE SURG 10 STRL SS (BLADE) IMPLANT
BNDG COHESIVE 6X5 TAN STRL LF (GAUZE/BANDAGES/DRESSINGS) ×4 IMPLANT
BNDG ESMARK 4X9 LF (GAUZE/BANDAGES/DRESSINGS) ×2 IMPLANT
BNDG GAUZE ELAST 4 BULKY (GAUZE/BANDAGES/DRESSINGS) ×2 IMPLANT
COVER MAYO STAND STRL (DRAPES) ×2 IMPLANT
COVER SURGICAL LIGHT HANDLE (MISCELLANEOUS) ×2 IMPLANT
CUFF TOURNIQUET SINGLE 34IN LL (TOURNIQUET CUFF) ×2 IMPLANT
CUFF TOURNIQUET SINGLE 44IN (TOURNIQUET CUFF) IMPLANT
DRAPE C-ARM 42X72 X-RAY (DRAPES) ×2 IMPLANT
DRAPE INCISE IOBAN 66X45 STRL (DRAPES) ×2 IMPLANT
DRAPE SURG 17X23 STRL (DRAPES) ×2 IMPLANT
DRAPE U-SHAPE 47X51 STRL (DRAPES) ×2 IMPLANT
DRSG PAD ABDOMINAL 8X10 ST (GAUZE/BANDAGES/DRESSINGS) ×2 IMPLANT
DRSG TELFA 3X8 NADH (GAUZE/BANDAGES/DRESSINGS) ×2 IMPLANT
DURAPREP 26ML APPLICATOR (WOUND CARE) ×4 IMPLANT
ELECT REM PT RETURN 9FT ADLT (ELECTROSURGICAL) ×2
ELECTRODE REM PT RTRN 9FT ADLT (ELECTROSURGICAL) ×1 IMPLANT
FACESHIELD WRAPAROUND (MASK) ×2 IMPLANT
GAUZE SPONGE 4X4 12PLY STRL (GAUZE/BANDAGES/DRESSINGS) ×2 IMPLANT
GAUZE XEROFORM 5X9 LF (GAUZE/BANDAGES/DRESSINGS) ×2 IMPLANT
GLOVE BIOGEL PI IND STRL 8 (GLOVE) ×1 IMPLANT
GLOVE BIOGEL PI INDICATOR 8 (GLOVE) ×1
GLOVE SURG ORTHO 8.0 STRL STRW (GLOVE) ×2 IMPLANT
GOWN STRL REUS W/ TWL LRG LVL3 (GOWN DISPOSABLE) ×3 IMPLANT
GOWN STRL REUS W/TWL LRG LVL3 (GOWN DISPOSABLE) ×3
GUIDE PIN 1.3 (Pin) ×4 IMPLANT
HANDPIECE INTERPULSE COAX TIP (DISPOSABLE)
KIT BASIN OR (CUSTOM PROCEDURE TRAY) ×2 IMPLANT
KIT ROOM TURNOVER OR (KITS) ×2 IMPLANT
MANIFOLD NEPTUNE II (INSTRUMENTS) ×2 IMPLANT
NEEDLE HYPO 25GX1X1/2 BEV (NEEDLE) ×2 IMPLANT
NS IRRIG 1000ML POUR BTL (IV SOLUTION) ×2 IMPLANT
PACK ORTHO EXTREMITY (CUSTOM PROCEDURE TRAY) ×2 IMPLANT
PAD ARMBOARD 7.5X6 YLW CONV (MISCELLANEOUS) ×4 IMPLANT
PAD CAST 4YDX4 CTTN HI CHSV (CAST SUPPLIES) ×2 IMPLANT
PADDING CAST COTTON 4X4 STRL (CAST SUPPLIES) ×2
PADDING CAST COTTON 6X4 STRL (CAST SUPPLIES) ×2 IMPLANT
PIN GUIDE 1.3 (Pin) ×2 IMPLANT
PLATE FIBULA DISTAL 5 HOLE (Plate) ×2 IMPLANT
SCREW CANC 5.0X14 (Screw) ×2 IMPLANT
SCREW CANNULATED 4.1X40 (Screw) ×4 IMPLANT
SCREW LOCK 10X3.5XST NS (Screw) ×1 IMPLANT
SCREW LOCK 3.5X10 (Screw) ×1 IMPLANT
SCREW LOCK 3.5X14 (Screw) ×4 IMPLANT
SCREW NL 3.5X14 (Screw) ×4 IMPLANT
SCREW NL 3.5X28 (Screw) ×2 IMPLANT
SCREW NLCK 16X3.5XST CORT PRLC (Screw) ×2 IMPLANT
SCREW NON LOCK 3.5X12 (Screw) ×2 IMPLANT
SCREW NONLOCK 3.5X10 (Screw) ×2 IMPLANT
SCREW NONLOCK 3.5X16 (Screw) ×2 IMPLANT
SET HNDPC FAN SPRY TIP SCT (DISPOSABLE) IMPLANT
SPONGE LAP 18X18 X RAY DECT (DISPOSABLE) ×2 IMPLANT
STOCKINETTE IMPERVIOUS 9X36 MD (GAUZE/BANDAGES/DRESSINGS) ×2 IMPLANT
SUCTION FRAZIER TIP 10 FR DISP (SUCTIONS) ×2 IMPLANT
SUT ETHILON 3 0 PS 1 (SUTURE) ×10 IMPLANT
SUT VIC AB 2-0 CT1 27 (SUTURE) ×2
SUT VIC AB 2-0 CT1 TAPERPNT 27 (SUTURE) ×2 IMPLANT
SUT VIC AB 2-0 CTB1 (SUTURE) ×6 IMPLANT
SUT VIC AB 3-0 SH 27 (SUTURE) ×2
SUT VIC AB 3-0 SH 27X BRD (SUTURE) ×2 IMPLANT
SYR CONTROL 10ML LL (SYRINGE) ×2 IMPLANT
TOWEL OR 17X24 6PK STRL BLUE (TOWEL DISPOSABLE) ×2 IMPLANT
TOWEL OR 17X26 10 PK STRL BLUE (TOWEL DISPOSABLE) ×2 IMPLANT
TUBE CONNECTING 12X1/4 (SUCTIONS) ×2 IMPLANT
WASHER 7.0MM OD (Orthopedic Implant) ×2 IMPLANT
WATER STERILE IRR 1000ML POUR (IV SOLUTION) ×2 IMPLANT
YANKAUER SUCT BULB TIP NO VENT (SUCTIONS) IMPLANT

## 2015-04-13 NOTE — Anesthesia Preprocedure Evaluation (Addendum)
Anesthesia Evaluation  Patient identified by MRN, date of birth, ID band Patient awake    Reviewed: Allergy & Precautions, NPO status , Patient's Chart, lab work & pertinent test results  Airway Mallampati: II  TM Distance: >3 FB Neck ROM: Full    Dental  (+) Teeth Intact, Dental Advisory Given   Pulmonary sleep apnea and Continuous Positive Airway Pressure Ventilation , former smoker,    breath sounds clear to auscultation       Cardiovascular hypertension, Pt. on medications + Peripheral Vascular Disease  + pacemaker  Rhythm:Regular Rate:Normal     Neuro/Psych PSYCHIATRIC DISORDERS Depression    GI/Hepatic GERD  Medicated,  Endo/Other  Hypothyroidism   Renal/GU   negative genitourinary   Musculoskeletal  (+) Arthritis , Osteoarthritis,    Abdominal   Peds negative pediatric ROS (+)  Hematology negative hematology ROS (+)   Anesthesia Other Findings   Reproductive/Obstetrics negative OB ROS                           Lab Results  Component Value Date   CREATININE 0.59 03/28/2015   BUN 12 03/28/2015   NA 133* 03/28/2015   K 4.9 03/28/2015   CL 97 03/28/2015   CO2 31 03/28/2015   Lab Results  Component Value Date   WBC 5.9 08/22/2014   HGB 13.0 08/22/2014   HCT 38.7 08/22/2014   MCV 91.5 08/22/2014   PLT 300 08/22/2014   No results found for: INR, PROTIME  EKG: paced, prolonged AV conduction.  Anesthesia Physical Anesthesia Plan  ASA: III  Anesthesia Plan: General and Regional   Post-op Pain Management:    Induction: Intravenous  Airway Management Planned: Oral ETT  Additional Equipment:   Intra-op Plan:   Post-operative Plan:   Informed Consent: I have reviewed the patients History and Physical, chart, labs and discussed the procedure including the risks, benefits and alternatives for the proposed anesthesia with the patient or authorized representative who has  indicated his/her understanding and acceptance.   Dental advisory given  Plan Discussed with: CRNA, Anesthesiologist and Surgeon  Anesthesia Plan Comments:        Anesthesia Quick Evaluation

## 2015-04-13 NOTE — Progress Notes (Signed)
ANTICOAGULATION CONSULT NOTE - Initial Consult  Pharmacy Consult for Coumadin Indication: VTE prophylaxis  Allergies  Allergen Reactions  . Adhesive [Tape] Swelling and Rash  . Neosporin [Neomycin-Polymyxin-Gramicidin] Swelling and Rash    Patient Measurements: Height: 5\' 8"  (172.7 cm) Weight: 186 lb 4.6 oz (84.5 kg) IBW/kg (Calculated) : 63.9  Vital Signs: Temp: 98.2 F (36.8 C) (09/22 2256) Temp Source: Oral (09/22 1710) BP: 155/59 mmHg (09/22 2251) Pulse Rate: 60 (09/22 2251)  Labs:  Recent Labs  04/13/15 1559 04/13/15 1830  HGB 14.5  --   HCT 42.9  --   PLT 227  --   CREATININE 0.54 0.56    Estimated Creatinine Clearance: 66 mL/min (by C-G formula based on Cr of 0.56).   Medical History: Past Medical History  Diagnosis Date  . Urine incontinence     takes Vesicare daily  . Urinary incontinence   . Atrial fibrillation 03/04/2013    takes Betapace daily  . Sinus node dysfunction 03/04/2013  . Pacemaker-Medtronic 03/04/2013  . Osteoporosis   . Depression     takes Celexa daily  . Hypertension     takes Amlodipine and Lisinopril-HCTZ  daily  . GERD (gastroesophageal reflux disease)     takes OMeprazole daily  . Joint swelling   . History of colon polyps     benign  . Urinary frequency   . Urinary urgency   . Nocturia   . Macular degeneration     wet in the right and dry in the left  . OSA on CPAP     sleep study in epic  . Arthritis     "back" (08/30/2014)  . Peripheral vascular disease     Medications:  Prescriptions prior to admission  Medication Sig Dispense Refill Last Dose  . aspirin 81 MG tablet Take 81 mg by mouth daily.   04/12/2015 at Unknown time  . B Complex-C (B-COMPLEX WITH VITAMIN C) tablet Take 1 tablet by mouth daily.   04/12/2015 at Unknown time  . Cholecalciferol (VITAMIN D3) 5000 UNITS CAPS Take 1 Can by mouth daily.   04/12/2015 at Unknown time  . citalopram (CELEXA) 20 MG tablet Take 1 tablet (20 mg total) by mouth daily. 90  tablet 3 04/13/2015 at 0830  . desmopressin (DDAVP) 0.2 MG tablet Take 1 tablet (0.2 mg total) by mouth daily. 90 tablet 1 04/12/2015 at Unknown time  . HYDROcodone-acetaminophen (NORCO/VICODIN) 5-325 MG per tablet Take 1 tablet by mouth every 6 (six) hours as needed for severe pain. 30 tablet 0 04/12/2015 at Unknown time  . lisinopril (PRINIVIL,ZESTRIL) 30 MG tablet Take 1 tablet (30 mg total) by mouth daily. 30 tablet 0 04/13/2015 at 0830  . magnesium oxide (MAG-OX) 400 MG tablet Take 1 tablet (400 mg total) by mouth daily. 30 tablet 0 04/13/2015 at 0830  . Misc Natural Products (GLUCOSAMINE CHONDROITIN MSM PO) Take 1,500 mg by mouth 2 (two) times daily.   04/13/2015 at 0830  . Multiple Vitamin (MULTIVITAMIN WITH MINERALS) TABS tablet Take 1 tablet by mouth daily.   04/12/2015 at Unknown time  . Multiple Vitamins-Minerals (ICAPS AREDS FORMULA PO) Take 1 capsule by mouth 2 (two) times daily.   04/13/2015 at 0830  . Omega-3 Fatty Acids (FISH OIL) 1200 MG CAPS Take 1 capsule by mouth 2 (two) times daily.   04/13/2015 at 0830  . omeprazole (PRILOSEC) 20 MG capsule Take 1 capsule (20 mg total) by mouth daily. 90 capsule 1 Past Week at Unknown time  .  solifenacin (VESICARE) 10 MG tablet Take 1 tablet (10 mg total) by mouth daily. 90 tablet 1 04/12/2015 at Unknown time  . sotalol (BETAPACE) 80 MG tablet TAKE ONE AND ONE-HALF TABLETS (120 MG TOTAL) BY MOUTH TWICE DAILY 90 tablet 1 04/13/2015 at 0830  . EPINEPHrine 0.3 mg/0.3 mL IJ SOAJ injection Inject 0.3 mLs (0.3 mg total) into the muscle once. 1 Device 1 Unknown at Unknown time  . levothyroxine (SYNTHROID, LEVOTHROID) 25 MCG tablet Take 1 tablet (25 mcg total) by mouth daily before breakfast. (Patient not taking: Reported on 03/28/2015) 90 tablet 1 Unknown at Unknown time   Scheduled:  . [START ON 04/14/2015] B-complex with vitamin C  1 tablet Oral Daily  . [START ON 04/14/2015] citalopram  20 mg Oral Daily  . [START ON 04/14/2015] darifenacin  7.5 mg Oral Daily  .  [START ON 04/14/2015] desmopressin  0.2 mg Oral Daily  . ICAPS   Oral BID  . [START ON 04/14/2015] lisinopril  30 mg Oral Daily  . [START ON 04/14/2015] magnesium oxide  400 mg Oral Daily  . [START ON 04/14/2015] multivitamin with minerals  1 tablet Oral Daily  . mupirocin ointment  1 application Nasal BID  . mupirocin ointment      . [START ON 04/14/2015] pantoprazole  40 mg Oral Daily  . [START ON 04/14/2015] sotalol  80 mg Oral Daily  . vancomycin  1,000 mg Intravenous Q12H  . [START ON 04/14/2015] Vitamin D3   Oral Daily    Assessment: 81yo female sustained injury to ankle yesterday, presented to clinic w/ bimalleolar ankle fracture w/ displacement, now s/p ORIF, to begin Coumadin for VTE Px.  Goal of Therapy:  INR 2-3   Plan:  Will get baseline INR in am and give Coumadin 5mg  po x1 on 9/23 and monitor INR for dose adjustments; will begin Coumadin education.  Wynona Neat, PharmD, BCPS  04/13/2015,11:22 PM

## 2015-04-13 NOTE — Anesthesia Postprocedure Evaluation (Signed)
  Anesthesia Post-op Note  Patient: Brittany [name]  Procedure(s) Performed: Procedure(s): OPEN REDUCTION INTERNAL FIXATION (ORIF) LEFT ANKLE FRACTURE (Left)  Patient Location: PACU  Anesthesia Type: General, Regional   Level of Consciousness: awake, alert  and oriented  Airway and Oxygen Therapy: Patient Spontanous Breathing  Post-op Pain: none  Post-op Assessment: Post-op Vital signs reviewed  Post-op Vital Signs: Reviewed  Last Vitals:  Filed Vitals:   04/13/15 2237  BP:   Pulse:   Temp: 36.4 C  Resp:     Complications: No apparent anesthesia complications

## 2015-04-13 NOTE — Progress Notes (Signed)
CRITICAL VALUE ALERT  Critical value received:  Potassium >7.5  Date of notification:  04/13/15  Time of notification:  9476  Critical value read back:Yes.    Nurse who received alert:  Kathrine Cords, RN  MD notified (1st page):  Dr. Marlou Sa  Time of first page:  1753  Responding MD:  Dr. Marlou Sa  Time MD responded:  5465  Received orders for re-draw of BMET.

## 2015-04-13 NOTE — Brief Op Note (Signed)
04/13/2015  10:31 PM  PATIENT:  Brittany [name]  81 y.o. female  PRE-OPERATIVE DIAGNOSIS:  LEFT ANKLE FRACTURE, DISLOCATION  POST-OPERATIVE DIAGNOSIS:  LEFT ANKLE FRACTURE, DISLOCATION  PROCEDURE:  Procedure(s): OPEN REDUCTION INTERNAL FIXATION (ORIF) LEFT ANKLE FRACTURE  SURGEON:  Surgeon(s): Meredith Pel, MD  ASSISTANT: none  ANESTHESIA:   general  EBL: 15 ml    Total I/O In: 800 [I.V.:800] Out: 615 [Urine:600; Blood:15]  BLOOD ADMINISTERED: none  DRAINS: none   LOCAL MEDICATIONS USED:  none  SPECIMEN:  No Specimen  COUNTS:  YES  TOURNIQUET:  * No tourniquets in log *  DICTATION: .Other Dictation: Dictation Number [id number]  PLAN OF CARE: Admit to inpatient   PATIENT DISPOSITION:  PACU - hemodynamically stable

## 2015-04-13 NOTE — Anesthesia Procedure Notes (Addendum)
Procedure Name: LMA Insertion Date/Time: 04/13/2015 8:30 PM Performed by: Valetta Fuller Pre-anesthesia Checklist: Patient identified, Emergency Drugs available, Suction available and Patient being monitored Patient Re-evaluated:Patient Re-evaluated prior to inductionOxygen Delivery Method: Circle system utilized Preoxygenation: Pre-oxygenation with 100% oxygen Intubation Type: IV induction Ventilation: Mask ventilation without difficulty LMA: LMA inserted LMA Size: 4.0 Number of attempts: 1 Tube secured with: Tape Dental Injury: Teeth and Oropharynx as per pre-operative assessment     Anesthesia Regional Block:  Popliteal block  Pre-Anesthetic Checklist: ,, timeout performed, Correct Patient, Correct Site, Correct Laterality, Correct Procedure, Correct Position, site marked, Risks and benefits discussed,  Surgical consent,  Pre-op evaluation,  At surgeon's request and post-op pain management  Laterality: Left and Lower  Prep: chloraprep       Needles:  Injection technique: Single-shot  Needle Type: Echogenic Needle     Needle Length: 9cm 9 cm Needle Gauge: 21 and 21 G    Additional Needles:  Procedures: ultrasound guided (picture in chart) Popliteal block Narrative:  Start time: 04/13/2015 8:06 PM End time: 04/13/2015 8:10 PM Injection made incrementally with aspirations every 5 mL.  Performed by: Personally  Anesthesiologist: Xaine Sansom   Anesthesia Regional Block:  Adductor canal block  Pre-Anesthetic Checklist: ,, timeout performed, Correct Patient, Correct Site, Correct Laterality, Correct Procedure, Correct Position, site marked, Risks and benefits discussed,  Surgical consent,  Pre-op evaluation,  At surgeon's request and post-op pain management  Laterality: Left and Lower  Prep: chloraprep       Needles:  Injection technique: Single-shot  Needle Type: Echogenic Needle     Needle Length: 9cm 9 cm Needle Gauge: 21 and 21 G    Additional  Needles:  Procedures: ultrasound guided (picture in chart) Adductor canal block Narrative:  Start time: 04/13/2015 8:11 PM End time: 04/13/2015 8:14 PM Injection made incrementally with aspirations every 5 mL.  Performed by: Personally  Anesthesiologist: Aiken Withem

## 2015-04-13 NOTE — H&P (Signed)
Brittany [name] is an 81 y.o. female.   Chief Complaint: Left ankle pain HPI: Brittany [name] is a 81 year old female with left ankle pain she sustained an injury to the ankle yesterday. She presents to the clinic today with bimalleolar ankle fracture with displacement on plain radiographs. She was itself for operative management after expiration risk benefits the family history of DVT or pulmonary embolism  Past Medical History  Diagnosis Date  . Urine incontinence     takes Vesicare daily  . Urinary incontinence   . Atrial fibrillation 03/04/2013    takes Betapace daily  . Sinus node dysfunction 03/04/2013  . Pacemaker-Medtronic 03/04/2013  . Osteoporosis   . Depression     takes Celexa daily  . Hypertension     takes Amlodipine and Lisinopril-HCTZ  daily  . GERD (gastroesophageal reflux disease)     takes OMeprazole daily  . Joint swelling   . History of colon polyps     benign  . Urinary frequency   . Urinary urgency   . Nocturia   . Macular degeneration     wet in the right and dry in the left  . OSA on CPAP     sleep study in epic  . Arthritis     "back" (08/30/2014)  . Peripheral vascular disease     Past Surgical History  Procedure Laterality Date  . Pacemaker placement Right 2012  . Cataract extraction    . Colonoscopy    . Esophagogastroduodenoscopy    . Shoulder arthroscopy w/ rotator cuff repair Right 08/30/2014    WITH MINI-OPEN ROTATOR CUFF REPAIR AND SUBACROMIAL DECOMPRESSION  . Laparoscopic cholecystectomy  08/11/1999  . Insert / replace / remove pacemaker    . Lumbar disc surgery  02/2014  . Back surgery    . Vaginal hysterectomy  1970  . Cataract extraction w/ intraocular lens  implant, bilateral Bilateral   . Shoulder arthroscopy with rotator cuff repair and subacromial decompression Right 08/30/2014    Procedure: SHOULDER ARTHROSCOPY WITH MINI-OPEN ROTATOR CUFF REPAIR AND SUBACROMIAL DECOMPRESSION;  Surgeon: Meredith Pel, MD;  Location: [facility];  Service:  Orthopedics;  Laterality: Right;  RIGHT SHOULDER DIAGNOSTIC OPERATIVE ARTHROSCOPY, SUBACROMIAL DECOMPRESSION, MINI-OPEN ROTATOR CUFF REPAIR.    Family History  Problem Relation Age of Onset  . Cancer Mother     colon  . Cancer Father     unknown type  . Stroke Sister   . Skin cancer Brother   . Cancer Paternal Aunt     breast  . Heart disease Brother   . Asthma Brother    Social History:  reports that she has quit smoking. Her smoking use included Cigarettes. She has a 45 pack-year smoking history. She has never used smokeless tobacco. She reports that she drinks about 16.8 oz of alcohol per week. She reports that she does not use illicit drugs.  Allergies:  Allergies  Allergen Reactions  . Adhesive [Tape] Swelling and Rash  . Neosporin [Neomycin-Polymyxin-Gramicidin] Swelling and Rash    Medications Prior to Admission  Medication Sig Dispense Refill  . aspirin 81 MG tablet Take 81 mg by mouth daily.    . B Complex-C (B-COMPLEX WITH VITAMIN C) tablet Take 1 tablet by mouth daily.    . Cholecalciferol (VITAMIN D3) 5000 UNITS CAPS Take 1 Can by mouth daily.    . citalopram (CELEXA) 20 MG tablet Take 1 tablet (20 mg total) by mouth daily. 90 tablet 3  . desmopressin (DDAVP) 0.2 MG tablet Take 1  tablet (0.2 mg total) by mouth daily. 90 tablet 1  . HYDROcodone-acetaminophen (NORCO/VICODIN) 5-325 MG per tablet Take 1 tablet by mouth every 6 (six) hours as needed for severe pain. 30 tablet 0  . lisinopril (PRINIVIL,ZESTRIL) 30 MG tablet Take 1 tablet (30 mg total) by mouth daily. 30 tablet 0  . magnesium oxide (MAG-OX) 400 MG tablet Take 1 tablet (400 mg total) by mouth daily. 30 tablet 0  . Misc Natural Products (GLUCOSAMINE CHONDROITIN MSM PO) Take 1,500 mg by mouth 2 (two) times daily.    . Multiple Vitamin (MULTIVITAMIN WITH MINERALS) TABS tablet Take 1 tablet by mouth daily.    . Multiple Vitamins-Minerals (ICAPS AREDS FORMULA PO) Take 1 capsule by mouth 2 (two) times daily.    .  Omega-3 Fatty Acids (FISH OIL) 1200 MG CAPS Take 1 capsule by mouth 2 (two) times daily.    Marland Kitchen omeprazole (PRILOSEC) 20 MG capsule Take 1 capsule (20 mg total) by mouth daily. 90 capsule 1  . solifenacin (VESICARE) 10 MG tablet Take 1 tablet (10 mg total) by mouth daily. 90 tablet 1  . sotalol (BETAPACE) 80 MG tablet TAKE ONE AND ONE-HALF TABLETS (120 MG TOTAL) BY MOUTH TWICE DAILY 90 tablet 1  . EPINEPHrine 0.3 mg/0.3 mL IJ SOAJ injection Inject 0.3 mLs (0.3 mg total) into the muscle once. 1 Device 1  . levothyroxine (SYNTHROID, LEVOTHROID) 25 MCG tablet Take 1 tablet (25 mcg total) by mouth daily before breakfast. (Patient not taking: Reported on 03/28/2015) 90 tablet 1    Results for orders placed or performed during the hospital encounter of 04/13/15 (from the past 48 hour(s))  Surgical pcr screen     Status: Abnormal   Collection Time: 04/13/15  3:59 PM  Result Value Ref Range   MRSA, PCR POSITIVE (A) NEGATIVE    Comment: RESULT CALLED TO, READ BACK BY AND VERIFIED WITH: Rocky Morel RN [phone] 04/13/15 A BROWNING    Staphylococcus aureus POSITIVE (A) NEGATIVE    Comment:        The Xpert SA Assay (FDA approved for NASAL specimens in patients over 40 years of age), is one component of a comprehensive surveillance program.  Test performance has been validated by [facility] for patients greater than or equal to 35 year old. It is not intended to diagnose infection nor to guide or monitor treatment.   CBC     Status: None   Collection Time: 04/13/15  3:59 PM  Result Value Ref Range   WBC 7.3 4.0 - 10.5 K/uL   RBC 4.59 3.87 - 5.11 MIL/uL   Hemoglobin 14.5 12.0 - 15.0 g/dL   HCT 42.9 36.0 - 46.0 %   MCV 93.5 78.0 - 100.0 fL   MCH 31.6 26.0 - 34.0 pg   MCHC 33.8 30.0 - 36.0 g/dL   RDW 13.3 11.5 - 15.5 %   Platelets 227 150 - 400 K/uL  Basic metabolic panel     Status: Abnormal   Collection Time: 04/13/15  3:59 PM  Result Value Ref Range   Sodium 126 (L) 135 - 145 mmol/L   Potassium  >7.5 (HH) 3.5 - 5.1 mmol/L    Comment: SPECIMEN HEMOLYZED. HEMOLYSIS MAY AFFECT INTEGRITY OF RESULTS. CRITICAL RESULT CALLED TO, READ BACK BY AND VERIFIED WITH: T COOK,RN 1748 04/13/15 D BRADLEY    Chloride 93 (L) 101 - 111 mmol/L   CO2 25 22 - 32 mmol/L   Glucose, Bld 101 (H) 65 - 99 mg/dL  BUN 7 6 - 20 mg/dL   Creatinine, Ser 0.54 0.44 - 1.00 mg/dL   Calcium 9.8 8.9 - 10.3 mg/dL   GFR calc non Af Amer >60 >60 mL/min   GFR calc Af Amer >60 >60 mL/min    Comment: (NOTE) The eGFR has been calculated using the CKD EPI equation. This calculation has not been validated in all clinical situations. eGFR's persistently <60 mL/min signify possible Chronic Kidney Disease.    Anion gap 8 5 - 15  Basic metabolic panel     Status: Abnormal   Collection Time: 04/13/15  6:30 PM  Result Value Ref Range   Sodium 130 (L) 135 - 145 mmol/L   Potassium 4.2 3.5 - 5.1 mmol/L    Comment: DELTA CHECK NOTED NO VISIBLE HEMOLYSIS    Chloride 95 (L) 101 - 111 mmol/L   CO2 29 22 - 32 mmol/L   Glucose, Bld 107 (H) 65 - 99 mg/dL   BUN <5 (L) 6 - 20 mg/dL   Creatinine, Ser 0.56 0.44 - 1.00 mg/dL   Calcium 9.8 8.9 - 10.3 mg/dL   GFR calc non Af Amer >60 >60 mL/min   GFR calc Af Amer >60 >60 mL/min    Comment: (NOTE) The eGFR has been calculated using the CKD EPI equation. This calculation has not been validated in all clinical situations. eGFR's persistently <60 mL/min signify possible Chronic Kidney Disease.    Anion gap 6 5 - 15   Dg Ankle Complete Left  04/12/2015   CLINICAL DATA:  Medial ankle pain after falling yesterday. Initial encounter.  EXAM: LEFT ANKLE COMPLETE - 3+ VIEW  COMPARISON:  None.  FINDINGS: The bones are demineralized. There are comminuted and laterally displaced fractures of both malleoli. There is lateral subluxation of the talus with respect to the tibial plafond. No frank dislocation or tarsal bone fracture identified. There is moderate soft tissue swelling around the ankle,  greatest laterally.  IMPRESSION: Displaced intra-articular fractures of the distal tibia and fibula as described with resulting lateral subluxation of the talus.   Electronically Signed   By: Richardean Sale M.D.   On: 04/12/2015 14:01    Review of Systems  Constitutional: Negative.   HENT: Negative.   Eyes: Negative.   Respiratory: Negative.   Cardiovascular: Negative.   Gastrointestinal: Negative.   Genitourinary: Negative.   Musculoskeletal: Positive for joint pain.  Skin: Negative.   Neurological: Negative.   Endo/Heme/Allergies: Negative.   Psychiatric/Behavioral: Negative.     Blood pressure 181/71, pulse 64, temperature 99.3 F (37.4 C), temperature source Oral, resp. rate 16, height $RemoveBe'5\' 8"'CFDXUnEZr$  (1.727 m), weight 84.5 kg (186 lb 4.6 oz), last menstrual period 07/22/1968, SpO2 98 %. Physical Exam  Constitutional: She appears well-developed.  HENT:  Head: Normocephalic.  Eyes: Pupils are equal, round, and reactive to light.  Neck: Normal range of motion.  Cardiovascular: Normal rate.   Respiratory: Effort normal.  Neurological: She is alert.  Skin: Skin is warm.  Psychiatric: She has a normal mood and affect.   examination of the left foot demonstrates mobile toes some swelling is present compartment are soft knee range of motion is intact but there is slight effusion   Assessment/Plan Impression is bimalleolar ankle fracture with displacement plan open reduction internal fixation risk benefits discussed with the patient including but limited to infection or vessel damage nonhealing malunion nonunion ankle stiffness arthritis DVT pulmonary embolism diminished functional ability -patient understands risk and benefits all questions answered  DEAN,GREGORY SCOTT 04/13/2015,  8:06 PM

## 2015-04-13 NOTE — Transfer of Care (Signed)
Immediate Anesthesia Transfer of Care Note  Patient: Brittany [name]  Procedure(s) Performed: Procedure(s): OPEN REDUCTION INTERNAL FIXATION (ORIF) LEFT ANKLE FRACTURE (Left)  Patient Location: PACU  Anesthesia Type:GA combined with regional for post-op pain  Level of Consciousness: awake, alert  and patient cooperative  Airway & Oxygen Therapy: Patient connected to nasal cannula oxygen  Post-op Assessment: Report given to RN and Post -op Vital signs reviewed and stable  Post vital signs: Reviewed and stable  Last Vitals:  Filed Vitals:   04/13/15 1710  BP: 181/71  Pulse: 64  Temp: 37.4 C  Resp: 16    Complications: No apparent anesthesia complications

## 2015-04-14 ENCOUNTER — Encounter (HOSPITAL_COMMUNITY): Payer: Self-pay | Admitting: Orthopedic Surgery

## 2015-04-14 LAB — PROTIME-INR
INR: 1.2 (ref 0.00–1.49)
Prothrombin Time: 15.3 seconds — ABNORMAL HIGH (ref 11.6–15.2)

## 2015-04-14 MED ORDER — COUMADIN BOOK
Freq: Once | Status: DC
  Filled 2015-04-14: qty 1

## 2015-04-14 MED ORDER — OXYCODONE HCL 5 MG PO TABS: 5.0000 mg | ORAL_TABLET | ORAL | Status: DC | PRN

## 2015-04-14 MED ORDER — WARFARIN SODIUM 5 MG PO TABS: 5.0000 mg | ORAL_TABLET | Freq: Every day | ORAL | Status: DC

## 2015-04-14 MED ORDER — MAGNESIUM HYDROXIDE 400 MG/5ML PO SUSP
15.0000 mL | Freq: Every day | ORAL | Status: DC
  Administered 2015-04-14 – 2015-04-15 (×2): 15 mL via ORAL
  Filled 2015-04-14 (×2): qty 30

## 2015-04-14 NOTE — Progress Notes (Signed)
Utilization review completed. Herve Haug, RN, BSN. 

## 2015-04-14 NOTE — Evaluation (Signed)
Physical Therapy Evaluation Patient Details Name: Brittany [name] MRN: [id number] DOB: January 31, 1934 Today's Date: 04/14/2015   History of Present Illness  Wilbur is a 82 year old female with left ankle pain she sustained an injury to the ankle yesterday. She presents to the clinic today with bimalleolar ankle fracture with displacement on plain radiographs. Pt s/p ankle ORIF  Clinical Impression  Pt presents with dependencies in mobility secondary to L ankle ORIF and resulting NWB. Pt reports she lives with her husband in an ALF. They reside in the independent living section. Pt sates her husband can provide some assistance with mobility. Pt is able to ambulate short distances while maintaining NWB. Recommend continued skilled PT to maximize mobility and independence. Recommend HHPT at ALF if pt's spouse is able to provide some assistance. Pt reports she can use a w/c at the facility and her husband is buying a scooter walker.     Follow Up Recommendations Home health PT;Supervision for mobility/OOB    Equipment Recommendations  Other (comment) (spouse buying a scooter walker, ALF has a w/c)    Recommendations for Other Services OT consult     Precautions / Restrictions Precautions Precautions: Fall Restrictions Weight Bearing Restrictions: Yes LLE Weight Bearing: Non weight bearing      Mobility  Bed Mobility Overal bed mobility: Needs Assistance Bed Mobility: Supine to Sit     Supine to sit: Supervision     General bed mobility comments: verballt discussed technique and pt was able to perform with supervision.  Transfers Overall transfer level: Needs assistance Equipment used: Rolling walker (2 wheeled) Transfers: Sit to/from [facility] Sit to Stand: Mod assist Stand pivot transfers: Min assist       General transfer comment: mod assist from the recliner, min assist to stand from the bed. Pt was able to maintain L NWB during pivot  transfer  Ambulation/Gait Ambulation/Gait assistance: Min assist Ambulation Distance (Feet): 5 Feet (2 trials) Assistive device: Rolling walker (2 wheeled)       General Gait Details: pt able to maintain L NWB  Stairs            Wheelchair Mobility    Modified Rankin (Stroke Patients Only)       Balance Overall balance assessment: Needs assistance Sitting-balance support: No upper extremity supported;Feet supported Sitting balance-Leahy Scale: Good     Standing balance support: Bilateral upper extremity supported Standing balance-Leahy Scale: Poor                               Pertinent Vitals/Pain Pain Assessment: No/denies pain (had a nerve block)    Home Living Family/patient expects to be discharged to:: Assisted living               Home Equipment: Walker - 4 wheels;Bedside commode Additional Comments: husband is going to buy a scooter walker today per patient.    Prior Function Level of Independence: Independent               Hand Dominance   Dominant Hand: Right    Extremity/Trunk Assessment   Upper Extremity Assessment: Defer to OT evaluation           Lower Extremity Assessment: LLE deficits/detail   LLE Deficits / Details: hip at least 3/5     Communication   Communication: No difficulties  Cognition Arousal/Alertness: Awake/alert Behavior During Therapy: WFL for tasks assessed/performed Overall Cognitive Status: Within Functional Limits for tasks assessed  General Comments      Exercises        Assessment/Plan    PT Assessment Patient needs continued PT services  PT Diagnosis Difficulty walking   PT Problem List Decreased strength;Decreased activity tolerance;Decreased balance;Decreased mobility;Decreased safety awareness  PT Treatment Interventions DME instruction;Gait training;Functional mobility training;Therapeutic activities;Therapeutic exercise;Balance  training;Patient/family education;Wheelchair mobility training   PT Goals (Current goals can be found in the Care Plan section) Acute Rehab PT Goals Patient Stated Goal: To be able to walk. PT Goal Formulation: With patient Time For Goal Achievement: 04/21/15 Potential to Achieve Goals: Good    Frequency Min 6X/week   Barriers to discharge        Co-evaluation               End of Session Equipment Utilized During Treatment: Gait belt Activity Tolerance: Patient tolerated treatment well Patient left: in chair;with call bell/phone within reach           Time: 0852-0935 PT Time Calculation (min) (ACUTE ONLY): 43 min   Charges:   PT Evaluation $Initial PT Evaluation Tier I: 1 Procedure PT Treatments $Gait Training: 8-22 mins $Therapeutic Activity: 8-22 mins   PT G Codes:        Lelon Mast 04/14/2015, 9:51 AM

## 2015-04-14 NOTE — Care Management Note (Signed)
Case Management Note  Patient Details  Name: Brittany [name] MRN: [id number] Date of Birth: May 29, 1934  Subjective/Objective:         Admitted with left ankle fracture, s/p ORIF           Action/Plan: Spoke with patient and her husband about home health for therapy, they stated that their independent living facility [facility] [phone] provides therapy.  Contacted [facility] and spoke with Candace who is head of the therapy there, she stated that they would be bale to provide the physical therapy. Faxed HHPT order to [phone] as requested. Received confirmation. Updated patient. Patient states that she has a knee walker, BSC, shower chair and wheelchair. No equipment needs identified by therapy or patient.     Expected Discharge Date:                  Expected Discharge Plan:  [place]  In-House Referral:     Discharge planning Services  CM Consult  Post Acute Care Choice:  Home Health Choice offered to:  Patient  DME Arranged:    DME Agency:     HH Arranged:    [place] Agency:     Status of Service:  Completed, signed off  Medicare Important Message Given:    Date Medicare IM Given:    Medicare IM give by:    Date Additional Medicare IM Given:    Additional Medicare Important Message give by:     If discussed at [place] of Stay Meetings, dates discussed:    Additional Comments:  [name], RN 04/14/2015, 3:35 PM

## 2015-04-14 NOTE — Op Note (Signed)
Brittany [name], Brittany [name]               ACCOUNT NO.:  [id number]  MEDICAL RECORD NO.:  [id number]  LOCATION:  [facility]                         FACILITY:  [facility]  PHYSICIAN:  Anderson Malta, M.D.    DATE OF BIRTH:  Oct 12, 1933  DATE OF PROCEDURE: DATE OF DISCHARGE:                              OPERATIVE REPORT   PREOPERATIVE DIAGNOSIS:  Left bimalleolar ankle fracture with lateral talar displacement.  POSTOPERATIVE DIAGNOSIS:  Left bimalleolar ankle fracture with lateral talar displacement.  PROCEDURE:  Open reduction and internal fixation, bimalleolar ankle fracture Smith and Nephew lateral plate and two 4-0 cancellous screws.  SURGEON:  Anderson Malta, M.D.  ASSISTANT:  None.  ANESTHESIA:  General.  INDICATIONS:  [name] is a 81 year old patient who fell yesterday and presents to the clinic this morning with displaced ankle fracture.  She presents now for operative management after explanation of risks and benefits.  PROCEDURE IN DETAIL:  The patient was brought to the operating room where general endotracheal anesthesia was induced.  Preoperative antibiotics were administered.  Time-out was called.  Left leg was prescrubbed with alcohol and Betadine, allowed to air dry, prepped with DuraPrep solution and draped in sterile manner.  There were some fracture blisters out of the operative field on the medial side.  These were protected and draped out of the field after sterile prepping and draping.  The Ioban was adherent and no fracture blister serous fluid was present within the operative field and was maintained that way by the [place].  Ankle Esmarch was utilized.  Incision was made over the lateral malleolus.  Skin and subcutaneous tissues were sharply divided after time-out was called.  Fracture was identified.  Care taken to avoid injury to superficial peroneal nerve.  The fracture was reduced. Lag screw placed anterior to posterior.  Plate was then applied with reasonable purchase  obtained on the screws.  Her bone quality was marginal.  Stable fracture reduction was achieved.  Thorough irrigation performed on the lateral side.  Attention was then directed medially. About 2.5 cm incision was made.  Incision was away from the fracture blisters.  Fracture was reduced and then pinned in with two 4-0 cancellous screws.  Good reduction was achieved.  Syndesmosis was stable. Thorough irrigation again was performed 1.5 liters medially and 1.5 liters laterally.  Ankle Esmarch was released.  Lateral incision was closed first using 2-0 Vicryl suture and 3-0 nylon.  Bactroban and Xeroform and 4x4s were used on the lateral side.  On the medial side, the incision was closed using 2-0 Vicryl and 3-0 nylon followed by Bactroban and Xeroform and Ioban.  This was sealed way from the fracture blisters.  Bactroban was then used over the fracture blisters along with a Telfa pad.  Well-padded bulky splint was then placed.  The patient tolerated the procedure well without immediate complication, transferred to recovery room in stable condition.     Anderson Malta, M.D.     GSD/MEDQ  D:  04/13/2015  T:  04/13/2015  Job:  [id number]

## 2015-04-14 NOTE — Evaluation (Signed)
Occupational Therapy Evaluation Patient Details Name: Brittany [name] MRN: [id number] DOB: 28-Jun-1934 Today's Date: 04/14/2015    History of Present Illness Brittany [name] is a 81 year old female with left ankle pain she sustained an injury to the ankle yesterday. She presents to the clinic today with bimalleolar ankle fracture with displacement on plain radiographs. Pt s/p ankle ORIF   Clinical Impression   Pt reports she was independent in ADLs PTA. Pt reports that she lives with her husband at an assisted living facility in the independent living section. Pt and her husband report that he will be able to provide assist with ADLs upon d/c home. Educated pt and husband on compensatory strategies for LB ADLs and reviewed NWB status. Pt currently min A for sit <> stand. Pt would benefit from continued skilled OT to increase independence and safety with toilet transfers using scooter walker prior to d/c home.     Follow Up Recommendations  No OT follow up;Supervision - Intermittent    Equipment Recommendations  None recommended by OT (pt has BSC and shower seat at home )    Recommendations for Other Services       Precautions / Restrictions Precautions Precautions: Fall Restrictions Weight Bearing Restrictions: Yes LLE Weight Bearing: Non weight bearing      Mobility Bed Mobility Overal bed mobility: Needs Assistance Bed Mobility: Supine to Sit;Sit to Supine     Supine to sit: Supervision Sit to supine: Supervision   General bed mobility comments: verbal cues for NWB when scooting up in bed  Transfers Overall transfer level: Needs assistance Equipment used: Rolling walker (2 wheeled) Transfers: Sit to/from Stand Sit to Stand: Min assist         General transfer comment: Min A to boost up from EOB    Balance Overall balance assessment: Needs assistance         Standing balance support: Bilateral upper extremity supported Standing balance-Leahy Scale: Poor Standing  balance comment: RW for support                            ADL Overall ADL's : Needs assistance/impaired Eating/Feeding: Set up;Sitting   Grooming: Set up;Sitting   Upper Body Bathing: Supervision/ safety;Sitting   Lower Body Bathing: Minimal assistance;Sit to/from stand   Upper Body Dressing : Supervision/safety;Sitting   Lower Body Dressing: Moderate assistance;Sit to/from stand   Toilet Transfer: Minimal assistance   Toileting- Clothing Manipulation and Hygiene: Minimal assistance;Sit to/from stand   Tub/ Shower Transfer: Shower seat;Grab bars;Minimal assistance;Walk-in shower   Functional mobility during ADLs: Minimal assistance General ADL Comments: Husband present during OT eval. Educated and demonstrated compensatory strategies for ADLs, pt reports husband will help with LB ADLs if needed. Pt reports history of falls within the last few years       Vision     Perception     Praxis      Pertinent Vitals/Pain Pain Assessment: No/denies pain (had a nerve block)     Hand Dominance Right   Extremity/Trunk Assessment Upper Extremity Assessment Upper Extremity Assessment: Overall WFL for tasks assessed   Lower Extremity Assessment Lower Extremity Assessment: Defer to PT evaluation       Communication Communication Communication: No difficulties   Cognition Arousal/Alertness: Awake/alert Behavior During Therapy: WFL for tasks assessed/performed Overall Cognitive Status: Within Functional Limits for tasks assessed                     General Comments  Exercises       Shoulder Instructions      Home Living Family/patient expects to be discharged to:: Assisted living                             Home Equipment: Walker - 4 wheels;Bedside commode;Shower seat;Grab bars - toilet;Grab bars - tub/shower;Other (comment) (scooter walker)          Prior Functioning/Environment Level of Independence: Independent              OT Diagnosis: Generalized weakness;Acute pain   OT Problem List: Decreased strength;Decreased activity tolerance;Impaired balance (sitting and/or standing);Decreased safety awareness;Decreased knowledge of use of DME or AE;Decreased knowledge of precautions;Pain   OT Treatment/Interventions: Self-care/ADL training;DME and/or AE instruction;Patient/family education    OT Goals(Current goals can be found in the care plan section) Acute Rehab OT Goals Patient Stated Goal: return to PLOF OT Goal Formulation: With patient Time For Goal Achievement: 04/28/15 Potential to Achieve Goals: Good ADL Goals Pt Will Perform Grooming: with modified independence;standing (using scooter walker) Pt Will Perform Lower Body Bathing: with supervision;sitting/lateral leans Pt Will Perform Lower Body Dressing: sit to/from stand;with min guard assist Pt Will Transfer to Toilet: with modified independence;ambulating;bedside commode (BSC over toilet, using scooter walker) Pt Will Perform Tub/Shower Transfer: with supervision;ambulating;shower seat (using scooter walker)  OT Frequency: Min 2X/week   Barriers to D/C:            Co-evaluation              End of Session Equipment Utilized During Treatment: Gait belt;Rolling walker;Oxygen  Activity Tolerance: Patient tolerated treatment well Patient left: in bed;with call bell/phone within reach;with family/visitor present   Time: 8466-5993 OT Time Calculation (min): 18 min Charges:  OT General Charges $OT Visit: 1 Procedure OT Evaluation $Initial OT Evaluation Tier I: 1 Procedure G-Codes:     Binnie Kand M.S., OTR/L Pager: [phone]  04/14/2015, 1:53 PM

## 2015-04-14 NOTE — Progress Notes (Signed)
Pt stable - pain ok block still in effect Toes perfused Plan dc am with knee scooter and coumadin

## 2015-04-14 NOTE — Discharge Instructions (Signed)

## 2015-04-15 LAB — PROTIME-INR
INR: 1.15 (ref 0.00–1.49)
Prothrombin Time: 14.9 seconds (ref 11.6–15.2)

## 2015-04-15 MED ORDER — WARFARIN SODIUM 5 MG PO TABS: 5.0000 mg | ORAL_TABLET | Freq: Once | ORAL | Status: DC

## 2015-04-15 NOTE — Progress Notes (Signed)
Physical Therapy Treatment Patient Details Name: Brittany [name] MRN: [id number] DOB: July 01, 1934 Today's Date: 04/15/2015    History of Present Illness Brittany [name] is a 81 year old female with left ankle pain she sustained an injury to the ankle yesterday. She presents to the clinic today with bimalleolar ankle fracture with displacement on plain radiographs. Pt s/p ankle ORIF    PT Comments    Pt and husband want to return home today. Pt will need to be at a WC level only with husband to assist with all transfers.  Both state understanding of this. Husband is very engaged as a caregiver and very supportive. Answered all questions related to mobility.  Anticipate DC home today.     Follow Up Recommendations        Equipment Recommendations       Recommendations for Other Services       Precautions / Restrictions Precautions Precautions: Fall Restrictions Weight Bearing Restrictions: Yes LLE Weight Bearing: Non weight bearing    Mobility  Bed Mobility Overal bed mobility:  (NT, up in chair)                Transfers Overall transfer level: Needs assistance Equipment used: None Transfers: Squat Pivot Transfers Sit to Stand: Mod assist   Squat pivot transfers: Min assist     General transfer comment: performed  twice. Lots of dicsussion/education with pt and wife on performing safe transfes at home.    Ambulation/Gait Ambulation/Gait assistance:  (unable)               Stairs            Wheelchair Mobility    Modified Rankin (Stroke Patients Only)       Balance                                    Cognition Arousal/Alertness: Awake/alert Behavior During Therapy: WFL for tasks assessed/performed Overall Cognitive Status: Within Functional Limits for tasks assessed                      Exercises      General Comments General comments (skin integrity, edema, etc.): husband present throughout session. Discussed pt is not  safe to use knee walker at this time and needs to use WC for all mobility. Extensive education on transfers completed.  Pt and husband feel safe for DC home with [facility] PT. They understand the risk of falls, NWB status. They want to borrow a WC at [facility] for now and if they have issues will discuss a rental [place] with MD on Monday.          Pertinent Vitals/Pain Pain Assessment: No/denies pain    Home Living                      Prior Function            PT Goals (current goals can now be found in the care plan section) Progress towards PT goals: Progressing toward goals    Frequency       PT Plan Current plan remains appropriate    Co-evaluation             End of Session Equipment Utilized During Treatment: Gait belt Activity Tolerance: Patient tolerated treatment well Patient left: in chair;with family/visitor present     Time: 1355-1437 PT Time Calculation (min) (ACUTE ONLY): 42  min  Charges:  $Therapeutic Activity: 38-52 mins                    G Codes:      Melvern Banker April 29, 2015, 2:45 PM  Lavonia Dana, PT  [phone] 04-29-2015

## 2015-04-15 NOTE — Progress Notes (Signed)
CSW received referral for New SNF.  CSW reviewed chart and noted that PT/OT recommending home health.  Inappropriate CSW referral.  CSW signing off.  Alison Murray, MSW, LCSW Clinical Social Work Weekend coverage [phone]

## 2015-04-15 NOTE — Progress Notes (Signed)
Subjective: 2 Days Post-Op Procedure(s) (LRB): OPEN REDUCTION INTERNAL FIXATION (ORIF) LEFT ANKLE FRACTURE (Left) Patient reports pain as moderate. Plans to discharge to home with husband later today.  Objective: Vital signs in last 24 hours: Temp:  [98 F (36.7 C)-99.1 F (37.3 C)] 99.1 F (37.3 C) (09/24 0448) Pulse Rate:  [64-74] 74 (09/24 0448) Resp:  [16-17] 16 (09/24 0448) BP: (127-146)/(54-58) 146/54 mmHg (09/24 0448) SpO2:  [94 %-98 %] 94 % (09/24 0448)  Intake/Output from previous day: 09/23 0701 - 09/24 0700 In: -  Out: 1900 [Urine:1900] Intake/Output this shift:     Recent Labs  04/13/15 1559  HGB 14.5    Recent Labs  04/13/15 1559  WBC 7.3  RBC 4.59  HCT 42.9  PLT 227    Recent Labs  04/13/15 1559 04/13/15 1830  NA 126* 130*  K >7.5* 4.2  CL 93* 95*  CO2 25 29  BUN 7 <5*  CREATININE 0.54 0.56  GLUCOSE 101* 107*  CALCIUM 9.8 9.8    Recent Labs  04/14/15 0435 04/15/15 0528  INR 1.20 1.15   Left lower leg Sensation intact distally at toes  Splint fits well Slight area of bleed through heel region of splint.  Assessment/Plan: 2 Days Post-Op Procedure(s) (LRB): OPEN REDUCTION INTERNAL FIXATION (ORIF) LEFT ANKLE FRACTURE (Left) Up with therapy Discharge home with home health    Erskine Emery 04/15/2015, 9:59 AM

## 2015-04-15 NOTE — Discharge Summary (Signed)
Patient ID: Brittany [name] MRN: [id number] DOB/AGE: [birth year] 81 y.o.  Admit date: 04/13/2015 Discharge date: 04/15/2015  Admission Diagnoses:  Active Problems:   Ankle fracture   Discharge Diagnoses:  Same  Past Medical History  Diagnosis Date  . Urine incontinence     takes Vesicare daily  . Urinary incontinence   . Atrial fibrillation 03/04/2013    takes Betapace daily  . Sinus node dysfunction 03/04/2013  . Pacemaker-Medtronic 03/04/2013  . Osteoporosis   . Depression     takes Celexa daily  . Hypertension     takes Amlodipine and Lisinopril-HCTZ  daily  . GERD (gastroesophageal reflux disease)     takes OMeprazole daily  . Joint swelling   . History of colon polyps     benign  . Urinary frequency   . Urinary urgency   . Nocturia   . Macular degeneration     wet in the right and dry in the left  . OSA on CPAP     sleep study in epic  . Arthritis     "back" (08/30/2014)  . Peripheral vascular disease     Surgeries: Procedure(s): OPEN REDUCTION INTERNAL FIXATION (ORIF) LEFT ANKLE FRACTURE on 04/13/2015   Consultants:  PT  Discharged Condition: Improved  Hospital Course: Brittany [name] is an 81 y.o. female who was admitted 04/13/2015 for operative treatment of<principal problem not specified>. Patient has severe unremitting pain that affects sleep, daily activities, and work/hobbies. After pre-op clearance the patient was taken to the operating room on 04/13/2015 and underwent  Procedure(s): OPEN REDUCTION INTERNAL FIXATION (ORIF) LEFT ANKLE FRACTURE.    Patient was given perioperative antibiotics: Anti-infectives    Start     Dose/Rate Route Frequency Ordered Stop   04/14/15 0800  vancomycin (VANCOCIN) IVPB 1000 mg/200 mL premix     1,000 mg 200 mL/hr over 60 Minutes Intravenous Every 12 hours 04/13/15 2317 04/14/15 0949   04/13/15 1630  ceFAZolin (ANCEF) IVPB 2 g/50 mL premix  Status:  Discontinued     2 g 100 mL/hr over 30 Minutes Intravenous On call to O.R.  04/13/15 1525 04/13/15 1654   04/13/15 1527  ceFAZolin (ANCEF) 2-3 GM-% IVPB SOLR    Comments:  Sammuel Cooper   : cabinet override      04/13/15 1527 04/13/15 2015       Patient was given sequential compression devices, early ambulation, and chemoprophylaxis to prevent DVT.  Patient benefited maximally from hospital stay and there were no complications.    Recent vital signs: Patient Vitals for the past 24 hrs:  BP Temp Temp src Pulse Resp SpO2  04/15/15 0448 (!) 146/54 mmHg 99.1 F (37.3 C) Oral 74 16 94 %  04/14/15 2042 (!) 127/58 mmHg 98 F (36.7 C) Oral 64 17 98 %     Recent laboratory studies:  Recent Labs  04/13/15 1559 04/13/15 1830 04/14/15 0435 04/15/15 0528  WBC 7.3  --   --   --   HGB 14.5  --   --   --   HCT 42.9  --   --   --   PLT 227  --   --   --   NA 126* 130*  --   --   K >7.5* 4.2  --   --   CL 93* 95*  --   --   CO2 25 29  --   --   BUN 7 <5*  --   --   CREATININE 0.54 0.56  --   --  GLUCOSE 101* 107*  --   --   INR  --   --  1.20 1.15  CALCIUM 9.8 9.8  --   --      Discharge Medications:     Medication List    STOP taking these medications        aspirin 81 MG tablet     Fish Oil 1200 MG Caps     GLUCOSAMINE CHONDROITIN MSM PO     HYDROcodone-acetaminophen 5-325 MG per tablet  Commonly known as:  NORCO/VICODIN      TAKE these medications        B-complex with vitamin C tablet  Take 1 tablet by mouth daily.     citalopram 20 MG tablet  Commonly known as:  CELEXA  Take 1 tablet (20 mg total) by mouth daily.     desmopressin 0.2 MG tablet  Commonly known as:  DDAVP  Take 1 tablet (0.2 mg total) by mouth daily.     EPINEPHrine 0.3 mg/0.3 mL Soaj injection  Commonly known as:  EPI-PEN  Inject 0.3 mLs (0.3 mg total) into the muscle once.     ICAPS AREDS FORMULA PO  Take 1 capsule by mouth 2 (two) times daily.     lisinopril 30 MG tablet  Commonly known as:  PRINIVIL,ZESTRIL  Take 1 tablet (30 mg total) by mouth daily.      magnesium oxide 400 MG tablet  Commonly known as:  MAG-OX  Take 1 tablet (400 mg total) by mouth daily.     multivitamin with minerals Tabs tablet  Take 1 tablet by mouth daily.     omeprazole 20 MG capsule  Commonly known as:  PRILOSEC  Take 1 capsule (20 mg total) by mouth daily.     oxyCODONE 5 MG immediate release tablet  Commonly known as:  Oxy IR/ROXICODONE  Take 1 tablet (5 mg total) by mouth every 3 (three) hours as needed for breakthrough pain.     solifenacin 10 MG tablet  Commonly known as:  VESICARE  Take 1 tablet (10 mg total) by mouth daily.     sotalol 80 MG tablet  Commonly known as:  BETAPACE  TAKE ONE AND ONE-HALF TABLETS (120 MG TOTAL) BY MOUTH TWICE DAILY     Vitamin D3 5000 UNITS Caps  Take 1 Can by mouth daily.     warfarin 5 MG tablet  Commonly known as:  COUMADIN  Take 1 tablet (5 mg total) by mouth daily.        Diagnostic Studies: Dg Ankle 2 Views Left  04/14/2015   CLINICAL DATA:  Ankle fracture, ORIF  EXAM: DG C-ARM 61-120 MIN; LEFT ANKLE - 2 VIEW  COMPARISON:  04/12/2015  FLUOROSCOPY TIME:  0 min 30 seconds  Images obtained: 2  FINDINGS: Marked osseous demineralization.  Two cannulated screws present across reduced medial malleolar fracture.  Lateral plate and multiple screws present across reduced lateral malleolar fracture.  Ankle mortise appears intact.  No additional focal bony abnormality seen.  IMPRESSION: Post bimalleolar ORIF   Electronically Signed   By: Lavonia Dana M.D.   On: 04/14/2015 00:55   Dg Ankle Complete Left  04/12/2015   CLINICAL DATA:  Medial ankle pain after falling yesterday. Initial encounter.  EXAM: LEFT ANKLE COMPLETE - 3+ VIEW  COMPARISON:  None.  FINDINGS: The bones are demineralized. There are comminuted and laterally displaced fractures of both malleoli. There is lateral subluxation of the talus with respect to the tibial plafond.  No frank dislocation or tarsal bone fracture identified. There is moderate soft tissue  swelling around the ankle, greatest laterally.  IMPRESSION: Displaced intra-articular fractures of the distal tibia and fibula as described with resulting lateral subluxation of the talus.   Electronically Signed   By: Richardean Sale M.D.   On: 04/12/2015 14:01   Dg C-arm 1-60 Min  04/14/2015   CLINICAL DATA:  Ankle fracture, ORIF  EXAM: DG C-ARM 61-120 MIN; LEFT ANKLE - 2 VIEW  COMPARISON:  04/12/2015  FLUOROSCOPY TIME:  0 min 30 seconds  Images obtained: 2  FINDINGS: Marked osseous demineralization.  Two cannulated screws present across reduced medial malleolar fracture.  Lateral plate and multiple screws present across reduced lateral malleolar fracture.  Ankle mortise appears intact.  No additional focal bony abnormality seen.  IMPRESSION: Post bimalleolar ORIF   Electronically Signed   By: Lavonia Dana M.D.   On: 04/14/2015 00:55    Disposition: 01-Home or Self Care      Discharge Instructions    Call MD / Call 911    Complete by:  As directed   If you experience chest pain or shortness of breath, CALL 911 and be transported to the hospital emergency room.  If you develope a fever above 101 F, pus (white drainage) or increased drainage or redness at the wound, or calf pain, call your surgeon's office.     Constipation Prevention    Complete by:  As directed   Drink plenty of fluids.  Prune juice may be helpful.  You may use a stool softener, such as Colace (over the counter) 100 mg twice a day.  Use MiraLax (over the counter) for constipation as needed.     Diet - low sodium heart healthy    Complete by:  As directed      Discharge instructions    Complete by:  As directed   Elevate leg Non weight bearing     Increase activity slowly as tolerated    Complete by:  As directed      Non weight bearing    Complete by:  As directed               Signed: Amanda, [place] 04/15/2015, 10:06 AM

## 2015-04-15 NOTE — Progress Notes (Signed)
ANTICOAGULATION CONSULT NOTE - Follow-up Consult  Pharmacy Consult for Coumadin Indication: VTE prophylaxis  Allergies  Allergen Reactions  . Adhesive [Tape] Swelling and Rash  . Neosporin [Neomycin-Polymyxin-Gramicidin] Swelling and Rash    Patient Measurements: Height: 5\' 8"  (172.7 cm) Weight: 186 lb 4.6 oz (84.5 kg) IBW/kg (Calculated) : 63.9  Vital Signs: Temp: 99.1 F (37.3 C) (09/24 0448) Temp Source: Oral (09/24 0448) BP: 146/54 mmHg (09/24 0448) Pulse Rate: 74 (09/24 0448)  Labs:  Recent Labs  04/13/15 1559 04/13/15 1830 04/14/15 0435 04/15/15 0528  HGB 14.5  --   --   --   HCT 42.9  --   --   --   PLT 227  --   --   --   LABPROT  --   --  15.3* 14.9  INR  --   --  1.20 1.15  CREATININE 0.54 0.56  --   --     Estimated Creatinine Clearance: 66 mL/min (by C-G formula based on Cr of 0.56).   Medical History: Past Medical History  Diagnosis Date  . Urine incontinence     takes Vesicare daily  . Urinary incontinence   . Atrial fibrillation 03/04/2013    takes Betapace daily  . Sinus node dysfunction 03/04/2013  . Pacemaker-Medtronic 03/04/2013  . Osteoporosis   . Depression     takes Celexa daily  . Hypertension     takes Amlodipine and Lisinopril-HCTZ  daily  . GERD (gastroesophageal reflux disease)     takes OMeprazole daily  . Joint swelling   . History of colon polyps     benign  . Urinary frequency   . Urinary urgency   . Nocturia   . Macular degeneration     wet in the right and dry in the left  . OSA on CPAP     sleep study in epic  . Arthritis     "back" (08/30/2014)  . Peripheral vascular disease     Medications:  Prescriptions prior to admission  Medication Sig Dispense Refill Last Dose  . aspirin 81 MG tablet Take 81 mg by mouth daily.   Past Week at Unknown time  . B Complex-C (B-COMPLEX WITH VITAMIN C) tablet Take 1 tablet by mouth daily.   Past Week at Unknown time  . Cholecalciferol (VITAMIN D3) 5000 UNITS CAPS Take 1 Can by  mouth daily.   Past Week at Unknown time  . citalopram (CELEXA) 20 MG tablet Take 1 tablet (20 mg total) by mouth daily. 90 tablet 3 Past Week at Unknown time  . desmopressin (DDAVP) 0.2 MG tablet Take 1 tablet (0.2 mg total) by mouth daily. 90 tablet 1 Past Week at Unknown time  . EPINEPHrine 0.3 mg/0.3 mL IJ SOAJ injection Inject 0.3 mLs (0.3 mg total) into the muscle once. 1 Device 1 unknown  . HYDROcodone-acetaminophen (NORCO/VICODIN) 5-325 MG per tablet Take 1 tablet by mouth every 6 (six) hours as needed for severe pain. 30 tablet 0 Past Week at Unknown time  . lisinopril (PRINIVIL,ZESTRIL) 30 MG tablet Take 1 tablet (30 mg total) by mouth daily. 30 tablet 0 Past Week at Unknown time  . magnesium oxide (MAG-OX) 400 MG tablet Take 1 tablet (400 mg total) by mouth daily. 30 tablet 0 Past Week at Unknown time  . Misc Natural Products (GLUCOSAMINE CHONDROITIN MSM PO) Take 1,500 mg by mouth 2 (two) times daily.   Past Week at Unknown time  . Multiple Vitamin (MULTIVITAMIN WITH MINERALS) TABS tablet  Take 1 tablet by mouth daily.   Past Week at Unknown time  . Multiple Vitamins-Minerals (ICAPS AREDS FORMULA PO) Take 1 capsule by mouth 2 (two) times daily.   Past Week at Unknown time  . Omega-3 Fatty Acids (FISH OIL) 1200 MG CAPS Take 1 capsule by mouth 2 (two) times daily.   Past Week at Unknown time  . omeprazole (PRILOSEC) 20 MG capsule Take 1 capsule (20 mg total) by mouth daily. 90 capsule 1 Past Week at Unknown time  . solifenacin (VESICARE) 10 MG tablet Take 1 tablet (10 mg total) by mouth daily. 90 tablet 1 Past Week at Unknown time  . sotalol (BETAPACE) 80 MG tablet TAKE ONE AND ONE-HALF TABLETS (120 MG TOTAL) BY MOUTH TWICE DAILY 90 tablet 1 Past Week at Unknown time   Scheduled:  . B-complex with vitamin C  1 tablet Oral Daily  . cholecalciferol  5,000 Units Oral Daily  . citalopram  20 mg Oral Daily  . darifenacin  7.5 mg Oral Daily  . desmopressin  0.2 mg Oral Daily  . lisinopril  30  mg Oral Daily  . magnesium hydroxide  15 mL Oral Daily  . magnesium oxide  400 mg Oral Daily  . multivitamin with minerals  1 tablet Oral Q supper  . multivitamin-lutein  1 capsule Oral Daily  . mupirocin ointment  1 application Nasal BID  . pantoprazole  40 mg Oral Daily  . sotalol  80 mg Oral Daily  . warfarin  5 mg Oral ONCE-1800  . Warfarin - Pharmacist Dosing Inpatient   Does not apply q1800    Assessment: 81yo female sustained injury to ankle (9/22), presented to clinic w/ bimalleolar ankle fracture w/ displacement, now s/p ORIF, on Coumadin for VTE Px.  Baseline INR 1.2. INR today 1.15 after warfarin 5mg  PO x1 last night (9/23). Coumadin edu completed on 9/23 H/H stable  Goal of Therapy:  INR 2-3   Plan:   -Coumadin 5mg  po X 1 tonight  -Daily INR -AM CBC -Monitor for s/s of bleeding   Jakell Trusty C. Lennox Grumbles, PharmD Pharmacy Resident  Pager: [phone] 04/15/2015 8:23 AM

## 2015-04-15 NOTE — Progress Notes (Signed)
Occupational Therapy Treatment Patient Details Name: Brittany [name] MRN: [id number] DOB: [birth year] Today's Date: 04/15/2015    History of present illness Brittany [name] is a 81 year old female with left ankle pain she sustained an injury to the ankle yesterday. She presents to the clinic today with bimalleolar ankle fracture with displacement on plain radiographs. Pt s/p ankle ORIF   OT comments  This 81 yo female admitted with above presents to acute OT with continued decreased balance and decreased mobility affecting her ability to safely manage her BADLs and decreased ability to help others with her care. Feel she would be better off if she went to an inpatient rehab setting first then home with husband with follow up (I mentioned this to the pt and asked PT to see how what they thought after they saw her).   Follow Up Recommendations  Home health OT;Other (comment) (SNF depending on how pt does with PT)    Equipment Recommendations  None recommended by OT       Precautions / Restrictions Precautions Precautions: Fall Restrictions Weight Bearing Restrictions: Yes LLE Weight Bearing: Non weight bearing       Mobility Bed Mobility Overal bed mobility: Needs Assistance Bed Mobility: Supine to Sit;Sit to Supine     Supine to sit: Supervision (without rail, HOB flat, pt using momentum to come up on left side of bed) Sit to supine: Min assist (for LLE to flat bed without rails)      Transfers Overall transfer level: Needs assistance               General transfer comment: Multiple transfers performed with pt. Sit>stand from higher surface (bed raised) min A, sit>partial stand (for squat pivot transfers) from lower surfaces min A with min A-Mod A for squat pivot transfers (depending on whether going from surfaces with or without arm rests)    Balance Overall balance assessment: Needs assistance Sitting-balance support: No upper extremity supported;Feet supported Sitting  balance-Leahy Scale: Good     Standing balance support: Bilateral upper extremity supported Standing balance-Leahy Scale: Poor                     ADL Overall ADL's : Needs assistance/impaired                           Toilet Transfer Details (indicate cue type and reason): Pt min-mod A for transfers depending on what surface transferring to/from (higher, lower, arms rests, no arm rests) Toileting- Clothing Manipulation and Hygiene: Modified independent;Sitting/lateral lean                Vision                 Additional Comments: wears glasses all the time                           Pertinent Vitals/ Pain       Pain Assessment: No/denies pain  Home Living Family/patient expects to be discharged to:: Assisted living                             Home Equipment: Walker - 4 wheels;Bedside commode;Shower seat;Grab bars - toilet;Grab bars - tub/shower;Other (comment) (knee walker)              Frequency Min 2X/week     Progress Toward Goals  OT Goals(current goals  can now be found in the care plan section)  Progress towards OT goals: Not progressing toward goals - comment (same as yesterday)            End of Session Equipment Utilized During Treatment: Gait belt;Rolling walker (attempted knee walker, but pt not ready/safe)   Activity Tolerance Patient tolerated treatment well   Patient Left in bed;with call bell/phone within reach;with bed alarm set   Nurse Communication  (put O2 back on pt due to pt's sats high 80's on RA)        Time: 5784-6962 OT Time Calculation (min): 64 min  Charges: OT General Charges $OT Visit: 1 Procedure OT Treatments $Self Care/Home Management : 53-67 mins  Almon Register [phone] 04/15/2015, 12:33 PM

## 2015-04-17 DIAGNOSIS — S82842D Displaced bimalleolar fracture of left lower leg, subsequent encounter for closed fracture with routine healing: Secondary | ICD-10-CM | POA: Diagnosis not present

## 2015-04-17 DIAGNOSIS — Z7901 Long term (current) use of anticoagulants: Secondary | ICD-10-CM | POA: Diagnosis not present

## 2015-04-25 ENCOUNTER — Other Ambulatory Visit: Payer: Medicare Other

## 2015-04-27 ENCOUNTER — Ambulatory Visit (HOSPITAL_COMMUNITY)
Admission: RE | Admit: 2015-04-27 | Discharge: 2015-04-27 | Disposition: A | Discharge status: Home / Self Care | Payer: Medicare Other | Source: Ambulatory Visit | Attending: Family Medicine | Admitting: Family Medicine

## 2015-04-27 ENCOUNTER — Other Ambulatory Visit (HOSPITAL_COMMUNITY): Payer: Self-pay | Admitting: Orthopedic Surgery

## 2015-04-27 DIAGNOSIS — M79662 Pain in left lower leg: Secondary | ICD-10-CM

## 2015-04-27 DIAGNOSIS — M7989 Other specified soft tissue disorders: Secondary | ICD-10-CM | POA: Insufficient documentation

## 2015-04-27 DIAGNOSIS — M79605 Pain in left leg: Secondary | ICD-10-CM

## 2015-04-27 NOTE — Progress Notes (Signed)
Preliminary results by tech - Left Lower Ext. Venous Duplex Completed. Negative for deep and superficial vein thrombosis in the left lower extremity. Cordon Gassett, BS, RDMS, RVT  

## 2015-04-29 ENCOUNTER — Other Ambulatory Visit: Payer: Self-pay | Admitting: Family Medicine

## 2015-05-01 DIAGNOSIS — Z23 Encounter for immunization: Secondary | ICD-10-CM | POA: Diagnosis not present

## 2015-05-01 NOTE — Telephone Encounter (Signed)
Lisinopril [facility]

## 2015-05-04 ENCOUNTER — Other Ambulatory Visit: Payer: Self-pay | Admitting: Family Medicine

## 2015-05-04 ENCOUNTER — Ambulatory Visit (INDEPENDENT_AMBULATORY_CARE_PROVIDER_SITE_OTHER): Payer: Medicare Other | Admitting: *Deleted

## 2015-05-04 DIAGNOSIS — I495 Sick sinus syndrome: Secondary | ICD-10-CM | POA: Diagnosis not present

## 2015-05-04 DIAGNOSIS — S82842B Displaced bimalleolar fracture of left lower leg, initial encounter for open fracture type I or II: Secondary | ICD-10-CM | POA: Diagnosis not present

## 2015-05-04 DIAGNOSIS — Z1231 Encounter for screening mammogram for malignant neoplasm of breast: Secondary | ICD-10-CM

## 2015-05-04 DIAGNOSIS — R2689 Other abnormalities of gait and mobility: Secondary | ICD-10-CM | POA: Diagnosis not present

## 2015-05-04 DIAGNOSIS — Z9181 History of falling: Secondary | ICD-10-CM | POA: Diagnosis not present

## 2015-05-04 DIAGNOSIS — R2681 Unsteadiness on feet: Secondary | ICD-10-CM | POA: Diagnosis not present

## 2015-05-04 NOTE — Progress Notes (Signed)
Remote pacemaker transmission.   

## 2015-05-05 ENCOUNTER — Other Ambulatory Visit: Payer: Self-pay | Admitting: Internal Medicine

## 2015-05-05 DIAGNOSIS — R2681 Unsteadiness on feet: Secondary | ICD-10-CM | POA: Diagnosis not present

## 2015-05-05 DIAGNOSIS — S82842B Displaced bimalleolar fracture of left lower leg, initial encounter for open fracture type I or II: Secondary | ICD-10-CM | POA: Diagnosis not present

## 2015-05-05 DIAGNOSIS — R2689 Other abnormalities of gait and mobility: Secondary | ICD-10-CM | POA: Diagnosis not present

## 2015-05-05 DIAGNOSIS — Z9181 History of falling: Secondary | ICD-10-CM | POA: Diagnosis not present

## 2015-05-05 LAB — CUP PACEART REMOTE DEVICE CHECK
Battery Impedance: 428 Ohm
Battery Remaining Longevity: 84 mo
Battery Voltage: 2.78 V
Brady Statistic AP VP Percent: 0 %
Brady Statistic AP VS Percent: 100 %
Brady Statistic AS VP Percent: 0 %
Brady Statistic AS VS Percent: 0 %
Date Time Interrogation Session: 20161013141835
Implantable Lead Implant Date: 20121213
Implantable Lead Implant Date: 20121213
Implantable Lead Location: 753859
Implantable Lead Location: 753860
Implantable Lead Model: 4076
Implantable Lead Model: 4076
Implantable Lead Serial Number: 835025
Implantable Lead Serial Number: 869269
Lead Channel Impedance Value: 383 Ohm
Lead Channel Impedance Value: 493 Ohm
Lead Channel Pacing Threshold Amplitude: 0.625 V
Lead Channel Pacing Threshold Amplitude: 0.625 V
Lead Channel Pacing Threshold Pulse Width: 0.4 ms
Lead Channel Pacing Threshold Pulse Width: 0.4 ms
Lead Channel Sensing Intrinsic Amplitude: 16 mV
Lead Channel Setting Pacing Amplitude: 2 V
Lead Channel Setting Pacing Amplitude: 2.5 V
Lead Channel Setting Pacing Pulse Width: 0.4 ms
Lead Channel Setting Sensing Sensitivity: 5.6 mV

## 2015-05-08 DIAGNOSIS — Z9181 History of falling: Secondary | ICD-10-CM | POA: Diagnosis not present

## 2015-05-08 DIAGNOSIS — R2689 Other abnormalities of gait and mobility: Secondary | ICD-10-CM | POA: Diagnosis not present

## 2015-05-08 DIAGNOSIS — S82842B Displaced bimalleolar fracture of left lower leg, initial encounter for open fracture type I or II: Secondary | ICD-10-CM | POA: Diagnosis not present

## 2015-05-08 DIAGNOSIS — R2681 Unsteadiness on feet: Secondary | ICD-10-CM | POA: Diagnosis not present

## 2015-05-09 ENCOUNTER — Encounter: Payer: Self-pay | Admitting: Cardiology

## 2015-05-10 DIAGNOSIS — M47816 Spondylosis without myelopathy or radiculopathy, lumbar region: Secondary | ICD-10-CM | POA: Diagnosis not present

## 2015-05-11 DIAGNOSIS — S82842D Displaced bimalleolar fracture of left lower leg, subsequent encounter for closed fracture with routine healing: Secondary | ICD-10-CM | POA: Diagnosis not present

## 2015-05-12 ENCOUNTER — Ambulatory Visit (HOSPITAL_BASED_OUTPATIENT_CLINIC_OR_DEPARTMENT_OTHER)
Admission: RE | Admit: 2015-05-12 | Discharge: 2015-05-12 | Disposition: A | Discharge status: Home / Self Care | Payer: Medicare Other | Source: Ambulatory Visit | Attending: Family Medicine | Admitting: Family Medicine

## 2015-05-12 DIAGNOSIS — Z1231 Encounter for screening mammogram for malignant neoplasm of breast: Secondary | ICD-10-CM | POA: Insufficient documentation

## 2015-05-12 DIAGNOSIS — R2689 Other abnormalities of gait and mobility: Secondary | ICD-10-CM | POA: Diagnosis not present

## 2015-05-12 DIAGNOSIS — R2681 Unsteadiness on feet: Secondary | ICD-10-CM | POA: Diagnosis not present

## 2015-05-12 DIAGNOSIS — Z9181 History of falling: Secondary | ICD-10-CM | POA: Diagnosis not present

## 2015-05-12 DIAGNOSIS — S82842B Displaced bimalleolar fracture of left lower leg, initial encounter for open fracture type I or II: Secondary | ICD-10-CM | POA: Diagnosis not present

## 2015-05-15 ENCOUNTER — Telehealth: Payer: Self-pay | Admitting: Family Medicine

## 2015-05-15 DIAGNOSIS — R32 Unspecified urinary incontinence: Secondary | ICD-10-CM

## 2015-05-15 NOTE — Telephone Encounter (Signed)
Patient is inquiring about an urology referral

## 2015-05-15 NOTE — Telephone Encounter (Signed)
Please advise 

## 2015-05-15 NOTE — Telephone Encounter (Signed)
I have placed a  Urology referral for her.

## 2015-05-16 DIAGNOSIS — R2681 Unsteadiness on feet: Secondary | ICD-10-CM | POA: Diagnosis not present

## 2015-05-16 DIAGNOSIS — R2689 Other abnormalities of gait and mobility: Secondary | ICD-10-CM | POA: Diagnosis not present

## 2015-05-16 DIAGNOSIS — Z9181 History of falling: Secondary | ICD-10-CM | POA: Diagnosis not present

## 2015-05-16 DIAGNOSIS — S82842B Displaced bimalleolar fracture of left lower leg, initial encounter for open fracture type I or II: Secondary | ICD-10-CM | POA: Diagnosis not present

## 2015-05-16 NOTE — Telephone Encounter (Signed)
Referral has been faxed to [facility]

## 2015-05-19 ENCOUNTER — Other Ambulatory Visit: Payer: Self-pay | Admitting: Family Medicine

## 2015-05-19 DIAGNOSIS — S82842B Displaced bimalleolar fracture of left lower leg, initial encounter for open fracture type I or II: Secondary | ICD-10-CM | POA: Diagnosis not present

## 2015-05-19 DIAGNOSIS — R2681 Unsteadiness on feet: Secondary | ICD-10-CM | POA: Diagnosis not present

## 2015-05-19 DIAGNOSIS — R2689 Other abnormalities of gait and mobility: Secondary | ICD-10-CM | POA: Diagnosis not present

## 2015-05-19 DIAGNOSIS — Z9181 History of falling: Secondary | ICD-10-CM | POA: Diagnosis not present

## 2015-05-23 DIAGNOSIS — R2689 Other abnormalities of gait and mobility: Secondary | ICD-10-CM | POA: Diagnosis not present

## 2015-05-23 DIAGNOSIS — Z9181 History of falling: Secondary | ICD-10-CM | POA: Diagnosis not present

## 2015-05-23 DIAGNOSIS — R2681 Unsteadiness on feet: Secondary | ICD-10-CM | POA: Diagnosis not present

## 2015-05-23 DIAGNOSIS — S82842B Displaced bimalleolar fracture of left lower leg, initial encounter for open fracture type I or II: Secondary | ICD-10-CM | POA: Diagnosis not present

## 2015-05-26 DIAGNOSIS — S82842B Displaced bimalleolar fracture of left lower leg, initial encounter for open fracture type I or II: Secondary | ICD-10-CM | POA: Diagnosis not present

## 2015-05-26 DIAGNOSIS — R2689 Other abnormalities of gait and mobility: Secondary | ICD-10-CM | POA: Diagnosis not present

## 2015-05-26 DIAGNOSIS — R2681 Unsteadiness on feet: Secondary | ICD-10-CM | POA: Diagnosis not present

## 2015-05-26 DIAGNOSIS — Z9181 History of falling: Secondary | ICD-10-CM | POA: Diagnosis not present

## 2015-05-29 DIAGNOSIS — R2689 Other abnormalities of gait and mobility: Secondary | ICD-10-CM | POA: Diagnosis not present

## 2015-05-29 DIAGNOSIS — S82842B Displaced bimalleolar fracture of left lower leg, initial encounter for open fracture type I or II: Secondary | ICD-10-CM | POA: Diagnosis not present

## 2015-05-29 DIAGNOSIS — Z9181 History of falling: Secondary | ICD-10-CM | POA: Diagnosis not present

## 2015-05-29 DIAGNOSIS — R2681 Unsteadiness on feet: Secondary | ICD-10-CM | POA: Diagnosis not present

## 2015-06-01 DIAGNOSIS — S82842D Displaced bimalleolar fracture of left lower leg, subsequent encounter for closed fracture with routine healing: Secondary | ICD-10-CM | POA: Diagnosis not present

## 2015-06-02 DIAGNOSIS — S82842B Displaced bimalleolar fracture of left lower leg, initial encounter for open fracture type I or II: Secondary | ICD-10-CM | POA: Diagnosis not present

## 2015-06-02 DIAGNOSIS — R2689 Other abnormalities of gait and mobility: Secondary | ICD-10-CM | POA: Diagnosis not present

## 2015-06-02 DIAGNOSIS — H353212 Exudative age-related macular degeneration, right eye, with inactive choroidal neovascularization: Secondary | ICD-10-CM | POA: Diagnosis not present

## 2015-06-02 DIAGNOSIS — Z9181 History of falling: Secondary | ICD-10-CM | POA: Diagnosis not present

## 2015-06-02 DIAGNOSIS — R2681 Unsteadiness on feet: Secondary | ICD-10-CM | POA: Diagnosis not present

## 2015-06-06 DIAGNOSIS — S82842B Displaced bimalleolar fracture of left lower leg, initial encounter for open fracture type I or II: Secondary | ICD-10-CM | POA: Diagnosis not present

## 2015-06-06 DIAGNOSIS — R2681 Unsteadiness on feet: Secondary | ICD-10-CM | POA: Diagnosis not present

## 2015-06-06 DIAGNOSIS — Z9181 History of falling: Secondary | ICD-10-CM | POA: Diagnosis not present

## 2015-06-06 DIAGNOSIS — R2689 Other abnormalities of gait and mobility: Secondary | ICD-10-CM | POA: Diagnosis not present

## 2015-06-09 DIAGNOSIS — R2689 Other abnormalities of gait and mobility: Secondary | ICD-10-CM | POA: Diagnosis not present

## 2015-06-09 DIAGNOSIS — S82842B Displaced bimalleolar fracture of left lower leg, initial encounter for open fracture type I or II: Secondary | ICD-10-CM | POA: Diagnosis not present

## 2015-06-09 DIAGNOSIS — Z9181 History of falling: Secondary | ICD-10-CM | POA: Diagnosis not present

## 2015-06-09 DIAGNOSIS — R2681 Unsteadiness on feet: Secondary | ICD-10-CM | POA: Diagnosis not present

## 2015-06-11 ENCOUNTER — Encounter (HOSPITAL_BASED_OUTPATIENT_CLINIC_OR_DEPARTMENT_OTHER): Payer: Self-pay | Admitting: Emergency Medicine

## 2015-06-11 ENCOUNTER — Emergency Department (HOSPITAL_BASED_OUTPATIENT_CLINIC_OR_DEPARTMENT_OTHER)
Admission: EM | Admit: 2015-06-11 | Discharge: 2015-06-12 | Disposition: A | Discharge status: Home / Self Care | Payer: Medicare Other | Attending: Emergency Medicine | Admitting: Emergency Medicine

## 2015-06-11 DIAGNOSIS — Z87891 Personal history of nicotine dependence: Secondary | ICD-10-CM | POA: Insufficient documentation

## 2015-06-11 DIAGNOSIS — I10 Essential (primary) hypertension: Secondary | ICD-10-CM | POA: Diagnosis not present

## 2015-06-11 DIAGNOSIS — Z79899 Other long term (current) drug therapy: Secondary | ICD-10-CM | POA: Insufficient documentation

## 2015-06-11 DIAGNOSIS — F329 Major depressive disorder, single episode, unspecified: Secondary | ICD-10-CM | POA: Diagnosis not present

## 2015-06-11 DIAGNOSIS — Z7901 Long term (current) use of anticoagulants: Secondary | ICD-10-CM | POA: Insufficient documentation

## 2015-06-11 DIAGNOSIS — Z950 Presence of cardiac pacemaker: Secondary | ICD-10-CM | POA: Diagnosis not present

## 2015-06-11 DIAGNOSIS — M1990 Unspecified osteoarthritis, unspecified site: Secondary | ICD-10-CM | POA: Insufficient documentation

## 2015-06-11 DIAGNOSIS — K219 Gastro-esophageal reflux disease without esophagitis: Secondary | ICD-10-CM | POA: Diagnosis not present

## 2015-06-11 DIAGNOSIS — I4891 Unspecified atrial fibrillation: Secondary | ICD-10-CM | POA: Insufficient documentation

## 2015-06-11 DIAGNOSIS — Z86010 Personal history of colonic polyps: Secondary | ICD-10-CM | POA: Insufficient documentation

## 2015-06-11 DIAGNOSIS — G4733 Obstructive sleep apnea (adult) (pediatric): Secondary | ICD-10-CM | POA: Diagnosis not present

## 2015-06-11 LAB — BASIC METABOLIC PANEL
Anion gap: 8 (ref 5–15)
BUN: 10 mg/dL (ref 6–20)
CO2: 25 mmol/L (ref 22–32)
Calcium: 10.3 mg/dL (ref 8.9–10.3)
Chloride: 101 mmol/L (ref 101–111)
Creatinine, Ser: 0.49 mg/dL (ref 0.44–1.00)
GFR calc Af Amer: >60 mL/min (ref >60)
GFR calc non Af Amer: >60 mL/min (ref >60)
Glucose, Bld: 112 mg/dL — ABNORMAL HIGH (ref 65–99)
Potassium: 4.6 mmol/L (ref 3.5–5.1)
Sodium: 134 mmol/L — ABNORMAL LOW (ref 135–145)

## 2015-06-11 NOTE — ED Notes (Signed)
Patient reports that her PMD changed her BP meds about 2 months ago. She saw a few ambulances tonight and thought she would check her BP. Patient reports that it was high at home. Denies any S/S or any changes.

## 2015-06-12 ENCOUNTER — Ambulatory Visit (INDEPENDENT_AMBULATORY_CARE_PROVIDER_SITE_OTHER): Payer: Medicare Other | Admitting: Family Medicine

## 2015-06-12 ENCOUNTER — Encounter: Payer: Self-pay | Admitting: Family Medicine

## 2015-06-12 VITALS — BP 168/78 | HR 68 | Temp 97.9°F | Resp 20 | Wt 179.8 lb

## 2015-06-12 DIAGNOSIS — I10 Essential (primary) hypertension: Secondary | ICD-10-CM | POA: Diagnosis not present

## 2015-06-12 DIAGNOSIS — E039 Hypothyroidism, unspecified: Secondary | ICD-10-CM

## 2015-06-12 MED ORDER — LISINOPRIL 40 MG PO TABS: ORAL_TABLET | ORAL | Status: DC

## 2015-06-12 NOTE — ED Provider Notes (Signed)
CSN: [id number]     Arrival date & time 06/11/15  2207 History  By signing my name below, I, Jolayne Panther, attest that this documentation has been prepared under the direction and in the presence of Shanon Rosser, MD. Electronically Signed: Jolayne Panther, Scribe. 06/12/2015. 12:42 AM.    Chief Complaint  Patient presents with  . Hypertension      The history is provided by the patient. No language interpreter was used.    HPI Comments: Brittany [name] is a 81 y.o. female who presents to the Emergency Department complaining of HTN onset earlier this evening. She took saw to ambulance is go by yesterday evening and this caused her to check her blood pressure. She reports a systolic blood pressure over 200 on 2 occasions. She had her antihypertensive medications adjusted about 2 weeks ago. Her most recent blood pressure in the department is 159/69. She denies having any associated symptoms, specifically chest pain, SOB, HA and blurred vision.   Pt reports she broke her left ankle 7 weeks ago which is still in an orthotic device.  Past Medical History  Diagnosis Date  . Urine incontinence     takes Vesicare daily  . Urinary incontinence   . Atrial fibrillation ([place]) 03/04/2013    takes Betapace daily  . Sinus node dysfunction ([place]) 03/04/2013  . Pacemaker-Medtronic 03/04/2013  . Osteoporosis   . Depression     takes Celexa daily  . Hypertension     takes Amlodipine and Lisinopril-HCTZ  daily  . GERD (gastroesophageal reflux disease)     takes OMeprazole daily  . Joint swelling   . History of colon polyps     benign  . Urinary frequency   . Urinary urgency   . Nocturia   . Macular degeneration     wet in the right and dry in the left  . OSA on CPAP     sleep study in epic  . Arthritis     "back" (08/30/2014)  . Peripheral vascular disease [facility])    Past Surgical History  Procedure Laterality Date  . Pacemaker placement Right 2012  . Cataract extraction    .  Colonoscopy    . Esophagogastroduodenoscopy    . Shoulder arthroscopy w/ rotator cuff repair Right 08/30/2014    WITH MINI-OPEN ROTATOR CUFF REPAIR AND SUBACROMIAL DECOMPRESSION  . Laparoscopic cholecystectomy  08/11/1999  . Insert / replace / remove pacemaker    . Lumbar disc surgery  02/2014  . Back surgery    . Vaginal hysterectomy  1970  . Cataract extraction w/ intraocular lens  implant, bilateral Bilateral   . Shoulder arthroscopy with rotator cuff repair and subacromial decompression Right 08/30/2014    Procedure: SHOULDER ARTHROSCOPY WITH MINI-OPEN ROTATOR CUFF REPAIR AND SUBACROMIAL DECOMPRESSION;  Surgeon: Meredith Pel, MD;  Location: [place];  Service: Orthopedics;  Laterality: Right;  RIGHT SHOULDER DIAGNOSTIC OPERATIVE ARTHROSCOPY, SUBACROMIAL DECOMPRESSION, MINI-OPEN ROTATOR CUFF REPAIR.  Marland Kitchen Orif ankle fracture Left 04/13/2015    Procedure: OPEN REDUCTION INTERNAL FIXATION (ORIF) LEFT ANKLE FRACTURE;  Surgeon: Meredith Pel, MD;  Location: [place];  Service: Orthopedics;  Laterality: Left;   Family History  Problem Relation Age of Onset  . Cancer Mother     colon  . Cancer Father     unknown type  . Stroke Sister   . Skin cancer Brother   . Cancer Paternal Aunt     breast  . Heart disease Brother   . Asthma Brother  Social History  Substance Use Topics  . Smoking status: Former Smoker -- 1.00 packs/day for 45 years    Types: Cigarettes  . Smokeless tobacco: Never Used     Comment: quit smoking in 1992  . Alcohol Use: 16.8 oz/week    28 Glasses of wine per week     Comment: 08/30/2014 "16oz wine q night"   OB History    No data available     Review of Systems A complete 10 system review of systems was obtained and all systems are negative except as noted in the HPI and PMH.    Allergies  Adhesive and Neosporin  Home Medications   Prior to Admission medications   Medication Sig Start Date End Date Taking? Authorizing Provider  B Complex-C (B-COMPLEX  WITH VITAMIN C) tablet Take 1 tablet by mouth daily.    Historical Provider, MD  Cholecalciferol (VITAMIN D3) 5000 UNITS CAPS Take 1 Can by mouth daily.    Historical Provider, MD  citalopram (CELEXA) 20 MG tablet Take 1 tablet (20 mg total) by mouth daily. 07/28/14   Irene Pap, NP  desmopressin (DDAVP) 0.2 MG tablet Take 1 tablet (0.2 mg total) by mouth daily. 01/05/15   Irene Pap, NP  EPINEPHrine 0.3 mg/0.3 mL IJ SOAJ injection Inject 0.3 mLs (0.3 mg total) into the muscle once. 04/01/14   Dorie Rank, MD  lisinopril (PRINIVIL,ZESTRIL) 30 MG tablet TAKE 1 TABLET (30 MG TOTAL) BY MOUTH DAILY. 05/19/15   Renee A Kuneff, DO  magnesium oxide (MAG-OX) 400 MG tablet Take 1 tablet (400 mg total) by mouth daily. 03/21/14   Deboraha Sprang, MD  Multiple Vitamin (MULTIVITAMIN WITH MINERALS) TABS tablet Take 1 tablet by mouth daily.    Historical Provider, MD  Multiple Vitamins-Minerals (ICAPS AREDS FORMULA PO) Take 1 capsule by mouth 2 (two) times daily.    Historical Provider, MD  omeprazole (PRILOSEC) 20 MG capsule Take 1 capsule (20 mg total) by mouth daily. 07/11/14   Irene Pap, NP  oxyCODONE (OXY IR/ROXICODONE) 5 MG immediate release tablet Take 1 tablet (5 mg total) by mouth every 3 (three) hours as needed for breakthrough pain. 04/14/15   Meredith Pel, MD  solifenacin (VESICARE) 10 MG tablet Take 1 tablet (10 mg total) by mouth daily. 01/05/15   Irene Pap, NP  sotalol (BETAPACE) 80 MG tablet TAKE ONE AND ONE-HALF TABLETS (120 MG TOTAL) BYMOUTH TWICE DAILY 05/05/15   Deboraha Sprang, MD  warfarin (COUMADIN) 5 MG tablet Take 1 tablet (5 mg total) by mouth daily. 04/14/15   Meredith Pel, MD   BP 159/69 mmHg  Pulse 60  Temp(Src) 98.2 F (36.8 C) (Oral)  Resp 18  Ht 5\' 8"  (1.727 m)  Wt 180 lb (81.647 kg)  BMI 27.38 kg/m2  SpO2 98%  LMP 07/22/1968   Physical Exam General: Well-developed, well-nourished female in no acute distress; appearance consistent with age of  record HENT: normocephalic; atraumatic Eyes: pupils equal, round and reactive to light; extraocular muscles intact; lens implants  Neck: supple Heart: regular rate and rhythm Lungs: clear to auscultation bilaterally Abdomen: soft; nondistended; nontender; bowel sounds present Extremities: No deformity; full range of motion; pulses normal; edema of left lower leg with ankle in orthotic device  Neurologic: Awake, alert and oriented; motor function intact in all extremities and symmetric; no facial droop Skin: Warm and dry Psychiatric: Normal mood and affect  ED Course  Procedures  DIAGNOSTIC STUDIES:    Oxygen  Saturation is 99% on RA, normal by my interpretation.    MDM   Nursing notes and vitals signs, including pulse oximetry, reviewed.  Summary of this visit's results, reviewed by myself:  Labs:  Results for orders placed or performed during the hospital encounter of 06/11/15 (from the past 24 hour(s))  Basic metabolic panel     Status: Abnormal   Collection Time: 06/11/15 11:00 PM  Result Value Ref Range   Sodium 134 (L) 135 - 145 mmol/L   Potassium 4.6 3.5 - 5.1 mmol/L   Chloride 101 101 - 111 mmol/L   CO2 25 22 - 32 mmol/L   Glucose, Bld 112 (H) 65 - 99 mg/dL   BUN 10 6 - 20 mg/dL   Creatinine, Ser 0.49 0.44 - 1.00 mg/dL   Calcium 10.3 8.9 - 10.3 mg/dL   GFR calc non Af Amer >60 >60 mL/min   GFR calc Af Amer >60 >60 mL/min   Anion gap 8 5 - 15     Final diagnoses:  Essential hypertension   I personally performed the services described in this documentation, which was scribed in my presence. The recorded information has been reviewed and is accurate.    Shanon Rosser, MD 06/12/15 [phone]

## 2015-06-12 NOTE — Patient Instructions (Addendum)
Hypertension Hypertension, commonly called high blood pressure, is when the force of blood pumping through your arteries is too strong. Your arteries are the blood vessels that carry blood from your heart throughout your body. A blood pressure reading consists of a higher number over a lower number, such as 110/72. The higher number (systolic) is the pressure inside your arteries when your heart pumps. The lower number (diastolic) is the pressure inside your arteries when your heart relaxes. Ideally you want your blood pressure below 140/90. Hypertension forces your heart to work harder to pump blood. Your arteries may become narrow or stiff. Having untreated or uncontrolled hypertension can cause heart attack, stroke, kidney disease, and other problems. RISK FACTORS Some risk factors for high blood pressure are controllable. Others are not.  Risk factors you cannot control include:   Race. You may be at higher risk if you are African American.  Age. Risk increases with age.  Gender. Men are at higher risk than women before age 64 years. After age 75, women are at higher risk than men. Risk factors you can control include:  Not getting enough exercise or physical activity.  Being overweight.  Getting too much fat, sugar, calories, or salt in your diet.  Drinking too much alcohol. SIGNS AND SYMPTOMS Hypertension does not usually cause signs or symptoms. Extremely high blood pressure (hypertensive crisis) may cause headache, anxiety, shortness of breath, and nosebleed. DIAGNOSIS To check if you have hypertension, your health care provider will measure your blood pressure while you are seated, with your arm held at the level of your heart. It should be measured at least twice using the same arm. Certain conditions can cause a difference in blood pressure between your right and left arms. A blood pressure reading that is higher than normal on one occasion does not mean that you need treatment. If  it is not clear whether you have high blood pressure, you may be asked to return on a different day to have your blood pressure checked again. Or, you may be asked to monitor your blood pressure at home for 1 or more weeks. TREATMENT Treating high blood pressure includes making lifestyle changes and possibly taking medicine. Living a healthy lifestyle can help lower high blood pressure. You may need to change some of your habits. Lifestyle changes may include:  Following the DASH diet. This diet is high in fruits, vegetables, and whole grains. It is low in salt, red meat, and added sugars.  Keep your sodium intake below 2,300 mg per day.  Getting at least 30-45 minutes of aerobic exercise at least 4 times per week.  Losing weight if necessary.  Not smoking.  Limiting alcoholic beverages.  Learning ways to reduce stress. Your health care provider may prescribe medicine if lifestyle changes are not enough to get your blood pressure under control, and if one of the following is true:  You are 56-74 years of age and your systolic blood pressure is above 140.  You are 61 years of age or older, and your systolic blood pressure is above 150.  Your diastolic blood pressure is above 90.  You have diabetes, and your systolic blood pressure is over [id number] or your diastolic blood pressure is over 90.  You have kidney disease and your blood pressure is above 140/90.  You have heart disease and your blood pressure is above 140/90. Your personal target blood pressure may vary depending on your medical conditions, your age, and other factors. HOME CARE INSTRUCTIONS  Have your blood pressure rechecked as directed by your health care provider.   Take medicines only as directed by your health care provider. Follow the directions carefully. Blood pressure medicines must be taken as prescribed. The medicine does not work as well when you skip doses. Skipping doses also puts you at risk for  problems.  Do not smoke.   Monitor your blood pressure at home as directed by your health care provider. SEEK MEDICAL CARE IF:   You think you are having a reaction to medicines taken.  You have recurrent headaches or feel dizzy.  You have swelling in your ankles.  You have trouble with your vision. SEEK IMMEDIATE MEDICAL CARE IF:  You develop a severe headache or confusion.  You have unusual weakness, numbness, or feel faint.  You have severe chest or abdominal pain.  You vomit repeatedly.  You have trouble breathing. MAKE SURE YOU:   Understand these instructions.  Will watch your condition.  Will get help right away if you are not doing well or get worse.   This information is not intended to replace advice given to you by your health care provider. Make sure you discuss any questions you have with your health care provider.   Document Released: 07/08/2005 Document Revised: 11/22/2014 Document Reviewed: 04/30/2013 Elsevier Interactive Patient Education 2016 [employer].    I have increased your BP medication to 40 mg daily. I will need to see you in 1 week to check BP and blood in 1 week by appt.

## 2015-06-12 NOTE — Progress Notes (Signed)
Subjective:    Patient ID: Brittany [name], female    DOB: Sep 15, 1933, 81 y.o.   MRN: [id number]  HPI  Hypertension: Patient presents for acute office visit today to discuss blood pressure medications. She was seen in the emergency room for elevated blood pressures that she was having at home. Patient states she was asymptomatic, no headaches, no visual changes, chest pain. She had elevated BO readings above [id number] systolic at home. According to patient, her blood pressure readings in the emergency room were also consistent with her home cuff reading above [id number] systolic. She states they monitored her blood pressure every few minutes, and had lab work. Her lab work was found to be normal, her blood pressure decreased to 159/69 and patient was discharged home. Patient states this morning when she took her blood pressure was again 180/75. Patient reports compliance with her lisinopril 30 mg daily. She is concerned she needs the hydrochlorothiazide portion back into her blood pressure medicine. HCTZ was discontinued secondary to hyponatremia, which self resolved after discontinuation of HCTZ.   Past Medical History  Diagnosis Date  . Urine incontinence     takes Vesicare daily  . Urinary incontinence   . Atrial fibrillation ([place]) 03/04/2013    takes Betapace daily  . Sinus node dysfunction ([place]) 03/04/2013  . Pacemaker-Medtronic 03/04/2013  . Osteoporosis   . Depression     takes Celexa daily  . Hypertension     takes Amlodipine and Lisinopril-HCTZ  daily  . GERD (gastroesophageal reflux disease)     takes OMeprazole daily  . Joint swelling   . History of colon polyps     benign  . Urinary frequency   . Urinary urgency   . Nocturia   . Macular degeneration     wet in the right and dry in the left  . OSA on CPAP     sleep study in epic  . Arthritis     "back" (08/30/2014)  . Peripheral vascular disease ([facility])    Allergies  Allergen Reactions  . Adhesive [Tape] Swelling and Rash  .  Neosporin [Neomycin-Polymyxin-Gramicidin] Swelling and Rash   Social History   Social History  . Marital Status: Married    Spouse Name: N/A  . Number of Children: 4  . Years of Education: 12   Occupational History  . [employer] Manager      retired   Social History Main Topics  . Smoking status: Former Smoker -- 1.00 packs/day for 45 years    Types: Cigarettes  . Smokeless tobacco: Never Used     Comment: quit smoking in 1992  . Alcohol Use: 16.8 oz/week    28 Glasses of wine per week     Comment: 08/30/2014 "16oz wine q night"  . Drug Use: No  . Sexual Activity: Yes    Birth Control/ Protection: Surgical   Other Topics Concern  . Not on file   Social History Narrative   Brittany [name] lives with her husband at [employer]. They are retired and recently relocated to [place] from [place]. She has 4 grown children.    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    Review of Systems Negative, with the exception of above mentioned in HPI     Objective:   Physical Exam BP 168/78 mmHg  Pulse 68  Temp(Src) 97.9 F (36.6 C) (Oral)  Resp 20  Wt 179 lb 12 oz (81.534 kg)  SpO2 97%  LMP 07/22/1968  Gen: Afebrile. No acute distress. Nontoxic in appearance, well-developed, well-nourished, Caucasian female. Pleasant. HENT: AT. [place].MMM.  Eyes:Pupils Equal Round Reactive to light, Extraocular movements intact,  Conjunctiva without redness, discharge or icterus. CV: RRR no murmur, no edema, +2/4 P posterior tibialis pulses Chest: CTAB, no wheeze or crackles Neuro: Using rocker roller with seat, secondary to left ankle fracture.  PERLA. EOMi. Alert. Oriented x3     Assessment & Plan:  1. Essential hypertension, benign-  - BP elevated on manual re-check. Increased lisinopril to 40 mg daily. Pt to monitor BP at home daily, and call if above 140/90 or becoming dizzy. Pts husband will bring cuff in this afternoon to check accuracy against manual cuff.  -  BP recheck in 1 week, with BMP - will recheck TSH at that time as well, she is overdue to check since stopped thyroid med. F/U 1 week

## 2015-06-14 DIAGNOSIS — R2689 Other abnormalities of gait and mobility: Secondary | ICD-10-CM | POA: Diagnosis not present

## 2015-06-14 DIAGNOSIS — Z9181 History of falling: Secondary | ICD-10-CM | POA: Diagnosis not present

## 2015-06-14 DIAGNOSIS — S82842B Displaced bimalleolar fracture of left lower leg, initial encounter for open fracture type I or II: Secondary | ICD-10-CM | POA: Diagnosis not present

## 2015-06-14 DIAGNOSIS — R2681 Unsteadiness on feet: Secondary | ICD-10-CM | POA: Diagnosis not present

## 2015-06-20 DIAGNOSIS — R2689 Other abnormalities of gait and mobility: Secondary | ICD-10-CM | POA: Diagnosis not present

## 2015-06-20 DIAGNOSIS — Z9181 History of falling: Secondary | ICD-10-CM | POA: Diagnosis not present

## 2015-06-20 DIAGNOSIS — R2681 Unsteadiness on feet: Secondary | ICD-10-CM | POA: Diagnosis not present

## 2015-06-20 DIAGNOSIS — S82842B Displaced bimalleolar fracture of left lower leg, initial encounter for open fracture type I or II: Secondary | ICD-10-CM | POA: Diagnosis not present

## 2015-06-21 ENCOUNTER — Ambulatory Visit (INDEPENDENT_AMBULATORY_CARE_PROVIDER_SITE_OTHER): Payer: Medicare Other | Admitting: Family Medicine

## 2015-06-21 ENCOUNTER — Telehealth: Payer: Self-pay | Admitting: Family Medicine

## 2015-06-21 ENCOUNTER — Encounter: Payer: Self-pay | Admitting: Family Medicine

## 2015-06-21 VITALS — BP 161/78 | HR 71 | Temp 98.2°F | Resp 18 | Ht 66.0 in | Wt 188.0 lb

## 2015-06-21 DIAGNOSIS — Z9181 History of falling: Secondary | ICD-10-CM | POA: Diagnosis not present

## 2015-06-21 DIAGNOSIS — R2681 Unsteadiness on feet: Secondary | ICD-10-CM | POA: Diagnosis not present

## 2015-06-21 DIAGNOSIS — I10 Essential (primary) hypertension: Secondary | ICD-10-CM | POA: Diagnosis not present

## 2015-06-21 DIAGNOSIS — E039 Hypothyroidism, unspecified: Secondary | ICD-10-CM

## 2015-06-21 DIAGNOSIS — S82842B Displaced bimalleolar fracture of left lower leg, initial encounter for open fracture type I or II: Secondary | ICD-10-CM | POA: Diagnosis not present

## 2015-06-21 DIAGNOSIS — R2689 Other abnormalities of gait and mobility: Secondary | ICD-10-CM | POA: Diagnosis not present

## 2015-06-21 LAB — BASIC METABOLIC PANEL
BUN: 10 mg/dL (ref 6–23)
CO2: 29 mEq/L (ref 19–32)
Calcium: 10.5 mg/dL (ref 8.4–10.5)
Chloride: 99 mEq/L (ref 96–112)
Creatinine, Ser: 0.56 mg/dL (ref 0.40–1.20)
GFR: 110.95 mL/min (ref >60.00)
Glucose, Bld: 89 mg/dL (ref 70–99)
Potassium: 4.6 mEq/L (ref 3.5–5.1)
Sodium: 135 mEq/L (ref 135–145)

## 2015-06-21 LAB — TSH: TSH: 2.32 u[IU]/mL (ref 0.35–4.50)

## 2015-06-21 MED ORDER — AMLODIPINE BESYLATE 2.5 MG PO TABS: 2.5000 mg | ORAL_TABLET | Freq: Every day | ORAL | Status: DC

## 2015-06-21 NOTE — Progress Notes (Signed)
Pre visit review using our clinic review tool, if applicable. No additional management support is needed unless otherwise documented below in the visit note. 

## 2015-06-21 NOTE — Progress Notes (Signed)
   Subjective:    Patient ID: Brittany [name], female    DOB: [birth year], 81 y.o.   MRN: [id number]  HPI  Hypertension: Patient presents to follow-up on her hypertension today. She does bring in blood pressure readings from home, with no readings below 99991111 systolic and diastolics within normal range. Patient states normally the systolic number is always in the 160s. Patient reports compliance with lisinopril 40 mg, which is an increase from her prior dose last week. She denies any chest pain, lower extremity edema, visual changes or headaches. BMP last week was within normal range, mildly low sodium at 134, which is an improvement for her.  Past Medical History  Diagnosis Date  . Urine incontinence     takes Vesicare daily  . Urinary incontinence   . Atrial fibrillation ([place]) 03/04/2013    takes Betapace daily  . Sinus node dysfunction ([place]) 03/04/2013  . Pacemaker-Medtronic 03/04/2013  . Osteoporosis   . Depression     takes Celexa daily  . Hypertension     takes Amlodipine and Lisinopril-HCTZ  daily  . GERD (gastroesophageal reflux disease)     takes OMeprazole daily  . Joint swelling   . History of colon polyps     benign  . Urinary frequency   . Urinary urgency   . Nocturia   . Macular degeneration     wet in the right and dry in the left  . OSA on CPAP     sleep study in epic  . Arthritis     "back" (08/30/2014)  . Peripheral vascular disease (HCC)    Allergies  Allergen Reactions  . Adhesive [Tape] Swelling and Rash  . Neosporin [Neomycin-Polymyxin-Gramicidin] Swelling and Rash    Review of Systems Negative, with the exception of above mentioned in HPI     Objective:   Physical Exam BP 161/78 mmHg  Pulse 71  Temp(Src) 98.2 F (36.8 C) (Temporal)  Resp 18  Ht 5\' 6"  (1.676 m)  Wt 188 lb (85.276 kg)  BMI 30.36 kg/m2  SpO2 98%  LMP 07/22/1968 Gen: Afebrile. No acute distress. Nontoxic in appearance, well-developed, well-nourished, Caucasian female. Mildly  overweight. HENT: AT. [place].  MMM.  Eyes:Pupils Equal Round Reactive to light, Extraocular movements intact,  Conjunctiva without redness, discharge or icterus. Neck/lymp/endocrine: Supple, no thyromegaly CV: RRR, no edema, +2/4 P posterior tibialis pulses Chest: CTAB, no wheeze or crackles     Assessment & Plan:  1. Hypothyroidism, unspecified hypothyroidism type - Pt has been prescribed 25 mcg Synthroid in the past, patient wanted a trial off the medication refill she is truly hypothyroid. She's been without symptoms since discontinuing the medication. Will recheck TSH today, if needed will add back 25 g of Synthroid. - TSH  2. Essential hypertension, benign - Patient now taking lisinopril 40 mg daily with blood pressures remaining elevated. Added Norvasc 2.5 mg daily to her regimen. Patient is to monitor her blood pressures, with parameters given. She is to call in on Monday if her blood pressures are above 140/90. They are above 140/90 we'll increase Norvasc to 5 mg daily and patient will need to follow-up within 1-2 weeks. - Basic Metabolic Panel (BMET) - amLODipine (NORVASC) 2.5 MG tablet; Take 1 tablet (2.5 mg total) by mouth daily.  Dispense: 90 tablet; Refill: 3   Three-month follow-up, sooner if the pressures are higher than parameters.

## 2015-06-21 NOTE — Telephone Encounter (Signed)
Please call patient: Her lab work is normal, including her thyroid and her sodium level.

## 2015-06-21 NOTE — Patient Instructions (Signed)
I have added Amlodipine to your Blood pressure medications. Take the 2.5 mg dose daily, and take your Blood pressure daily. If after the weekend you are still seeing above 140 on the top number, then call in and let us know. If it stays above 140 on the top I will want you to take 5 mg amlodipine daily, and continue to monitor until we get your BP below 140/90.  I will call you with the results of your labs once they are available.

## 2015-06-22 NOTE — Telephone Encounter (Signed)
Pt's husband aware of results.  Okay per DPR.

## 2015-06-27 DIAGNOSIS — R2689 Other abnormalities of gait and mobility: Secondary | ICD-10-CM | POA: Diagnosis not present

## 2015-06-27 DIAGNOSIS — S82842B Displaced bimalleolar fracture of left lower leg, initial encounter for open fracture type I or II: Secondary | ICD-10-CM | POA: Diagnosis not present

## 2015-06-27 DIAGNOSIS — R2681 Unsteadiness on feet: Secondary | ICD-10-CM | POA: Diagnosis not present

## 2015-06-27 DIAGNOSIS — Z9181 History of falling: Secondary | ICD-10-CM | POA: Diagnosis not present

## 2015-06-28 DIAGNOSIS — S82842D Displaced bimalleolar fracture of left lower leg, subsequent encounter for closed fracture with routine healing: Secondary | ICD-10-CM | POA: Diagnosis not present

## 2015-06-28 DIAGNOSIS — R2681 Unsteadiness on feet: Secondary | ICD-10-CM | POA: Diagnosis not present

## 2015-06-28 DIAGNOSIS — Z9181 History of falling: Secondary | ICD-10-CM | POA: Diagnosis not present

## 2015-06-28 DIAGNOSIS — S82842B Displaced bimalleolar fracture of left lower leg, initial encounter for open fracture type I or II: Secondary | ICD-10-CM | POA: Diagnosis not present

## 2015-06-28 DIAGNOSIS — R2689 Other abnormalities of gait and mobility: Secondary | ICD-10-CM | POA: Diagnosis not present

## 2015-06-30 DIAGNOSIS — M47816 Spondylosis without myelopathy or radiculopathy, lumbar region: Secondary | ICD-10-CM | POA: Diagnosis not present

## 2015-06-30 DIAGNOSIS — N3941 Urge incontinence: Secondary | ICD-10-CM | POA: Diagnosis not present

## 2015-06-30 DIAGNOSIS — R351 Nocturia: Secondary | ICD-10-CM | POA: Diagnosis not present

## 2015-06-30 DIAGNOSIS — M4807 Spinal stenosis, lumbosacral region: Secondary | ICD-10-CM | POA: Diagnosis not present

## 2015-06-30 DIAGNOSIS — M545 Low back pain: Secondary | ICD-10-CM | POA: Diagnosis not present

## 2015-07-03 ENCOUNTER — Other Ambulatory Visit: Payer: Self-pay | Admitting: Family Medicine

## 2015-07-03 DIAGNOSIS — R2681 Unsteadiness on feet: Secondary | ICD-10-CM | POA: Diagnosis not present

## 2015-07-03 DIAGNOSIS — R2689 Other abnormalities of gait and mobility: Secondary | ICD-10-CM | POA: Diagnosis not present

## 2015-07-03 DIAGNOSIS — Z9181 History of falling: Secondary | ICD-10-CM | POA: Diagnosis not present

## 2015-07-03 DIAGNOSIS — S82842B Displaced bimalleolar fracture of left lower leg, initial encounter for open fracture type I or II: Secondary | ICD-10-CM | POA: Diagnosis not present

## 2015-07-05 DIAGNOSIS — R2681 Unsteadiness on feet: Secondary | ICD-10-CM | POA: Diagnosis not present

## 2015-07-05 DIAGNOSIS — Z9181 History of falling: Secondary | ICD-10-CM | POA: Diagnosis not present

## 2015-07-05 DIAGNOSIS — R2689 Other abnormalities of gait and mobility: Secondary | ICD-10-CM | POA: Diagnosis not present

## 2015-07-05 DIAGNOSIS — S82842B Displaced bimalleolar fracture of left lower leg, initial encounter for open fracture type I or II: Secondary | ICD-10-CM | POA: Diagnosis not present

## 2015-07-06 ENCOUNTER — Telehealth: Payer: Self-pay | Admitting: *Deleted

## 2015-07-06 DIAGNOSIS — I10 Essential (primary) hypertension: Secondary | ICD-10-CM

## 2015-07-06 MED ORDER — AMLODIPINE BESYLATE 5 MG PO TABS: 5.0000 mg | ORAL_TABLET | Freq: Every day | ORAL | Status: DC

## 2015-07-06 NOTE — Telephone Encounter (Signed)
Spoke with patient gave her information.

## 2015-07-06 NOTE — Telephone Encounter (Signed)
Patient called and left message stating Dr Raoul Pitch told her to keep track of her blood pressure and if it remained elevated to increase her amlodipine from 2.5mg  to 5 mg daily . Patient states she had to increase it  to 5 mg on December 10,2016 and it is keeping her blood pressure down she just wanted [place] to know. Her next appt is not scheduled until February 2017. She states she may need a new Rx sent in for 5 mg dose.

## 2015-07-06 NOTE — Telephone Encounter (Signed)
Thank you. Please call pt and inform I have called in the Amlodipine 5 mg dose to her pharmacy (90d script)

## 2015-07-07 DIAGNOSIS — R2689 Other abnormalities of gait and mobility: Secondary | ICD-10-CM | POA: Diagnosis not present

## 2015-07-07 DIAGNOSIS — R2681 Unsteadiness on feet: Secondary | ICD-10-CM | POA: Diagnosis not present

## 2015-07-07 DIAGNOSIS — Z9181 History of falling: Secondary | ICD-10-CM | POA: Diagnosis not present

## 2015-07-07 DIAGNOSIS — S82842B Displaced bimalleolar fracture of left lower leg, initial encounter for open fracture type I or II: Secondary | ICD-10-CM | POA: Diagnosis not present

## 2015-07-10 DIAGNOSIS — S82842B Displaced bimalleolar fracture of left lower leg, initial encounter for open fracture type I or II: Secondary | ICD-10-CM | POA: Diagnosis not present

## 2015-07-10 DIAGNOSIS — R2689 Other abnormalities of gait and mobility: Secondary | ICD-10-CM | POA: Diagnosis not present

## 2015-07-10 DIAGNOSIS — R2681 Unsteadiness on feet: Secondary | ICD-10-CM | POA: Diagnosis not present

## 2015-07-10 DIAGNOSIS — Z9181 History of falling: Secondary | ICD-10-CM | POA: Diagnosis not present

## 2015-08-02 DIAGNOSIS — N3941 Urge incontinence: Secondary | ICD-10-CM | POA: Diagnosis not present

## 2015-08-02 DIAGNOSIS — R351 Nocturia: Secondary | ICD-10-CM | POA: Diagnosis not present

## 2015-08-03 ENCOUNTER — Telehealth: Payer: Self-pay | Admitting: Cardiology

## 2015-08-03 ENCOUNTER — Ambulatory Visit (INDEPENDENT_AMBULATORY_CARE_PROVIDER_SITE_OTHER): Payer: Medicare Other | Admitting: *Deleted

## 2015-08-03 DIAGNOSIS — I495 Sick sinus syndrome: Secondary | ICD-10-CM

## 2015-08-03 NOTE — Telephone Encounter (Signed)
LMOVM reminding pt to send remote transmission.   

## 2015-08-03 NOTE — Progress Notes (Signed)
Remote pacemaker transmission.   

## 2015-08-07 ENCOUNTER — Other Ambulatory Visit: Payer: Self-pay | Admitting: Family Medicine

## 2015-08-07 ENCOUNTER — Telehealth: Payer: Self-pay | Admitting: *Deleted

## 2015-08-07 MED ORDER — CITALOPRAM HYDROBROMIDE 20 MG PO TABS: 20.0000 mg | ORAL_TABLET | Freq: Every day | ORAL | Status: DC

## 2015-08-07 NOTE — Telephone Encounter (Signed)
Pharmacy called concerned about patients script for citalopram . They states there is a drug interaction with her Betapace and they want to know if they should still fill this for her . Please advise.

## 2015-08-11 LAB — CUP PACEART REMOTE DEVICE CHECK
Battery Impedance: 479 Ohm
Battery Remaining Longevity: 81 mo
Battery Voltage: 2.78 V
Brady Statistic AP VP Percent: 0 %
Brady Statistic AP VS Percent: 100 %
Brady Statistic AS VP Percent: 0 %
Brady Statistic AS VS Percent: 0 %
Date Time Interrogation Session: 20170112151122
Implantable Lead Implant Date: 20121213
Implantable Lead Implant Date: 20121213
Implantable Lead Location: 753859
Implantable Lead Location: 753860
Implantable Lead Model: 4076
Implantable Lead Model: 4076
Implantable Lead Serial Number: 835025
Implantable Lead Serial Number: 869269
Lead Channel Impedance Value: 425 Ohm
Lead Channel Impedance Value: 510 Ohm
Lead Channel Setting Pacing Amplitude: 2 V
Lead Channel Setting Pacing Amplitude: 2.5 V
Lead Channel Setting Pacing Pulse Width: 0.4 ms
Lead Channel Setting Sensing Sensitivity: 5.6 mV

## 2015-08-18 ENCOUNTER — Encounter: Payer: Self-pay | Admitting: Cardiology

## 2015-08-18 ENCOUNTER — Other Ambulatory Visit (INDEPENDENT_AMBULATORY_CARE_PROVIDER_SITE_OTHER): Payer: Medicare Other

## 2015-08-18 DIAGNOSIS — E039 Hypothyroidism, unspecified: Secondary | ICD-10-CM

## 2015-08-18 DIAGNOSIS — I10 Essential (primary) hypertension: Secondary | ICD-10-CM

## 2015-08-18 LAB — COMPLETE METABOLIC PANEL WITH GFR
ALT: 15 U/L (ref 6–29)
AST: 17 U/L (ref 10–35)
Albumin: 3.7 g/dL (ref 3.6–5.1)
Alkaline Phosphatase: 95 U/L (ref 33–130)
BUN: 15 mg/dL (ref 7–25)
CO2: 28 mmol/L (ref 20–31)
Calcium: 10 mg/dL (ref 8.6–10.4)
Chloride: 101 mmol/L (ref 98–110)
Creat: 0.62 mg/dL (ref 0.60–0.93)
GFR, Est African American: >89 mL/min (ref >=60)
GFR, Est Non African American: 86 mL/min (ref >=60)
Glucose, Bld: 97 mg/dL (ref 65–99)
Potassium: 4.9 mmol/L (ref 3.5–5.3)
Sodium: 135 mmol/L (ref 135–146)
Total Bilirubin: 0.5 mg/dL (ref 0.2–1.2)
Total Protein: 6.7 g/dL (ref 6.1–8.1)

## 2015-08-18 LAB — BASIC METABOLIC PANEL
BUN: 16 mg/dL (ref 6–23)
CO2: 30 mEq/L (ref 19–32)
Calcium: 10.2 mg/dL (ref 8.4–10.5)
Chloride: 99 mEq/L (ref 96–112)
Creatinine, Ser: 0.64 mg/dL (ref 0.40–1.20)
GFR: 95.07 mL/min (ref >60.00)
Glucose, Bld: 102 mg/dL — ABNORMAL HIGH (ref 70–99)
Potassium: 4.3 mEq/L (ref 3.5–5.1)
Sodium: 135 mEq/L (ref 135–145)

## 2015-08-18 LAB — T4, FREE: Free T4: 0.77 ng/dL (ref 0.60–1.60)

## 2015-08-18 LAB — TSH: TSH: 2.55 u[IU]/mL (ref 0.35–4.50)

## 2015-08-19 ENCOUNTER — Other Ambulatory Visit: Payer: Self-pay | Admitting: Family Medicine

## 2015-08-21 ENCOUNTER — Other Ambulatory Visit: Payer: Self-pay | Admitting: Family Medicine

## 2015-08-21 ENCOUNTER — Other Ambulatory Visit: Payer: Medicare Other

## 2015-08-25 ENCOUNTER — Ambulatory Visit (INDEPENDENT_AMBULATORY_CARE_PROVIDER_SITE_OTHER): Payer: Medicare Other | Admitting: Family Medicine

## 2015-08-25 ENCOUNTER — Encounter: Payer: Self-pay | Admitting: Family Medicine

## 2015-08-25 VITALS — BP 155/77 | HR 65 | Temp 97.5°F | Resp 20 | Ht 68.0 in | Wt 192.8 lb

## 2015-08-25 DIAGNOSIS — I10 Essential (primary) hypertension: Secondary | ICD-10-CM

## 2015-08-25 NOTE — Patient Instructions (Signed)
Continue to montior BP at home, if above 140/90 then please call in and inform us, we will increase to 1.5 tabs of amlodipine if that is occuring and see you in 2 weeks after increase for nurse check of BP.

## 2015-08-25 NOTE — Progress Notes (Signed)
Patient ID: Brittany [name], female   DOB: 10-24-1933, 82 y.o.   MRN: [id number]   Subjective:    Patient ID: Brittany [name], female    DOB: November 09, 1933, 82 y.o.   MRN: [id number]  HPI  Hypertension: Patient presents to follow-up on her hypertension today. Patient has stopped monitoring her blood pressures at home after she had been below goal. She was tapered to amlodipine 5 mg after her last visit secondary to elevated blood pressures. This was added to her lisinopril 40 mg daily.  She denies any chest pain, lower extremity edema, visual changes or headaches. BMP last week was within normal range, with now normal sodium.  Past Medical History  Diagnosis Date  . Urine incontinence     takes Vesicare daily  . Urinary incontinence   . Atrial fibrillation ([place]) 03/04/2013    takes Betapace daily  . Sinus node dysfunction ([place]) 03/04/2013  . Pacemaker-Medtronic 03/04/2013  . Osteoporosis   . Depression     takes Celexa daily  . Hypertension     takes Amlodipine and Lisinopril-HCTZ  daily  . GERD (gastroesophageal reflux disease)     takes OMeprazole daily  . Joint swelling   . History of colon polyps     benign  . Urinary frequency   . Urinary urgency   . Nocturia   . Macular degeneration     wet in the right and dry in the left  . OSA on CPAP     sleep study in epic  . Arthritis     "back" (08/30/2014)  . Peripheral vascular disease (HCC)    Allergies  Allergen Reactions  . Adhesive [Tape] Swelling and Rash  . Neosporin [Neomycin-Polymyxin-Gramicidin] Swelling and Rash    Review of Systems Negative, with the exception of above mentioned in HPI     Objective:   Physical Exam BP 155/77 mmHg  Pulse 65  Temp(Src) 97.5 F (36.4 C)  Resp 20  Ht 5\' 8"  (1.727 m)  Wt 192 lb 12 oz (87.431 kg)  BMI 29.31 kg/m2  SpO2 99%  LMP 07/22/1968 Gen: Afebrile. No acute distress. Nontoxic in appearance, well-developed, well-nourished, Caucasian female. Mildly overweight. HENT: AT. [place].   MMM.  Eyes:Pupils Equal Round Reactive to light, Extraocular movements intact,  Conjunctiva without redness, discharge or icterus. Neck/lymp/endocrine: Supple, no thyromegaly CV: RRR, no edema, +2/4 P posterior tibialis pulses Chest: CTAB, no wheeze or crackles      Assessment & Plan:  1. Hypothyroidism, unspecified hypothyroidism type - We discussed continuation of therapy, with TSH normal 2.  - No further TSH recheck, patient is not to be placed back on thyroid and become symptomatic.  2. Essential hypertension, benign - Patient now taking lisinopril 40 mg and Norvasc 5 mg daily. Patient is to monitor her blood pressures, with parameters given. She is to call in on Monday if her blood pressures are above 140/90. Therapy of parameters, patient is to be encouraged to take 7.5 mg of amlodipine daily, and continue monitoring for another week. She will need to make a nurse visit at that time to have her blood pressures checked here. If they still are not at goal increased to amlodipine 10 mg daily.  Three-month follow-up, sooner if the pressures are higher than parameters.

## 2015-08-30 DIAGNOSIS — S82842D Displaced bimalleolar fracture of left lower leg, subsequent encounter for closed fracture with routine healing: Secondary | ICD-10-CM | POA: Diagnosis not present

## 2015-09-02 ENCOUNTER — Other Ambulatory Visit: Payer: Self-pay | Admitting: Family Medicine

## 2015-09-05 DIAGNOSIS — H353124 Nonexudative age-related macular degeneration, left eye, advanced atrophic with subfoveal involvement: Secondary | ICD-10-CM | POA: Diagnosis not present

## 2015-09-05 DIAGNOSIS — H35372 Puckering of macula, left eye: Secondary | ICD-10-CM | POA: Diagnosis not present

## 2015-09-05 DIAGNOSIS — H353212 Exudative age-related macular degeneration, right eye, with inactive choroidal neovascularization: Secondary | ICD-10-CM | POA: Diagnosis not present

## 2015-09-06 DIAGNOSIS — N3941 Urge incontinence: Secondary | ICD-10-CM | POA: Diagnosis not present

## 2015-09-06 DIAGNOSIS — R351 Nocturia: Secondary | ICD-10-CM | POA: Diagnosis not present

## 2015-09-14 ENCOUNTER — Ambulatory Visit (INDEPENDENT_AMBULATORY_CARE_PROVIDER_SITE_OTHER): Payer: Medicare Other | Admitting: Pulmonary Disease

## 2015-09-14 ENCOUNTER — Encounter: Payer: Self-pay | Admitting: Pulmonary Disease

## 2015-09-14 VITALS — BP 128/80 | HR 66 | Ht 68.0 in | Wt 191.0 lb

## 2015-09-14 DIAGNOSIS — G4733 Obstructive sleep apnea (adult) (pediatric): Secondary | ICD-10-CM | POA: Diagnosis not present

## 2015-09-14 DIAGNOSIS — Z9989 Dependence on other enabling machines and devices: Secondary | ICD-10-CM

## 2015-09-14 NOTE — Patient Instructions (Signed)
Follow up in 1 year.

## 2015-09-14 NOTE — Progress Notes (Signed)
Current Outpatient Prescriptions on File Prior to Visit  Medication Sig  . amLODipine (NORVASC) 5 MG tablet Take 1 tablet (5 mg total) by mouth daily.  . B Complex-C (B-COMPLEX WITH VITAMIN C) tablet Take 1 tablet by mouth daily.  . Cholecalciferol (VITAMIN D3) 5000 UNITS CAPS Take 1 Can by mouth daily.  . citalopram (CELEXA) 20 MG tablet Take 1 tablet (20 mg total) by mouth daily.  Marland Kitchen desmopressin (DDAVP) 0.2 MG tablet Take 1 tablet (0.2 mg total) by mouth daily.  Marland Kitchen EPINEPHrine 0.3 mg/0.3 mL IJ SOAJ injection Inject 0.3 mLs (0.3 mg total) into the muscle once.  Marland Kitchen lisinopril (PRINIVIL,ZESTRIL) 40 MG tablet TAKE 1 TABLET BY MOUTH DAILY  . magnesium oxide (MAG-OX) 400 MG tablet Take 1 tablet (400 mg total) by mouth daily.  . Multiple Vitamin (MULTIVITAMIN WITH MINERALS) TABS tablet Take 1 tablet by mouth daily.  . Multiple Vitamins-Minerals (ICAPS AREDS FORMULA PO) Take 1 capsule by mouth 2 (two) times daily.  Marland Kitchen omeprazole (PRILOSEC) 20 MG capsule Take 1 capsule (20 mg total) by mouth daily.  . solifenacin (VESICARE) 10 MG tablet Take 1 tablet (10 mg total) by mouth daily.  . sotalol (BETAPACE) 80 MG tablet TAKE ONE AND ONE-HALF TABLETS (120 MG TOTAL) BYMOUTH TWICE DAILY   No current facility-administered medications on file prior to visit.     Chief Complaint  Patient presents with  . Follow-up    Using CPAP nightly x 9-10 hrs. Denies problems with mask or pressure. Pt states that her DME changed recently.      Tests PSG 08/13/12 ([place]) >> AHI 24, SaO2 low 72%, CPAP 8 PSG 07/03/14 >> AHI 24.4, SaO2 low 76%, PLMI 81.8. CPAP 8 cm H2O >> AHI 0, +S.  CPAP 08/15/15 to 09/13/15 >> used on 30 of 30 nights with average 8 hrs and 56 min.  Average AHI is 1 with CPAP 8 cm H2O.   Past medical hx A fib, HTN, Depression, Hypothyroidism, GERD  Past surgical hx, Allergies, Family hx, Social hx all reviewed.  Vital Signs BP 128/80 mmHg  Pulse 66  Ht 5\' 8"  (1.727 m)  Wt 191 lb (86.637 kg)   BMI 29.05 kg/m2  SpO2 96%  LMP 07/22/1968  History of Present Illness Romey Sobieraj is a 82 y.o. female with OSA.  She has been doing well with CPAP.  No issues with mask fit.  She feels rested during the day.    She slipped on a ramp and broke her Lt leg >> this has made it difficult to keep up with exercise.  Physical Exam  General - No distress ENT - No sinus tenderness, no oral exudate, no LAN, scalloped tongue Cardiac - s1s2 regular, no murmur Chest - No wheeze/rales/dullness Back - No focal tenderness Abd - Soft, non-tender Ext - No edema, Lt ankle in brace Neuro - Normal strength Skin - No rashes Psych - normal mood, and behavior   Assessment/Plan  Obstructive sleep apnea. She is compliant with therapy and reports benefit. Plan: - continue CPAP 8 cm H2O   Patient Instructions  Follow up in 1 year     Chesley Mires, MD [facility] Pager:  [phone]

## 2015-10-07 ENCOUNTER — Other Ambulatory Visit: Payer: Self-pay | Admitting: Family Medicine

## 2015-10-09 ENCOUNTER — Telehealth: Payer: Self-pay | Admitting: Pulmonary Disease

## 2015-10-09 NOTE — Telephone Encounter (Signed)
[facility]  She states that they faxed order for CPAP supplies to be signed today  I checked and nothing on the fax or VS's lookat, so I asked her to refax  Will await fax

## 2015-10-10 NOTE — Telephone Encounter (Signed)
Nothing received yet. Will call 10/11/15 if not received.

## 2015-10-10 NOTE — Telephone Encounter (Signed)
Ashtyn, have you seen this yet?? Please advise thanks

## 2015-10-11 NOTE — Telephone Encounter (Signed)
[place], spoke with Gwyndolyn Saxon, aware that we have not received fax.  This is being faxed as URGENT to the main fax # Will await fax

## 2015-10-12 NOTE — Telephone Encounter (Signed)
Form has been received. Brittany [name] has this and is in VS's folder to sign upon his return. Will send to Brittany [name] to follow up

## 2015-10-16 NOTE — Telephone Encounter (Signed)
Awaiting VS return to office for signature.

## 2015-10-17 NOTE — Telephone Encounter (Signed)
This was faxed on 10/16/2015 about 4:30pm to fax # [phone] received confirmation that the fax was received

## 2015-10-27 DIAGNOSIS — L814 Other melanin hyperpigmentation: Secondary | ICD-10-CM | POA: Diagnosis not present

## 2015-10-27 DIAGNOSIS — L578 Other skin changes due to chronic exposure to nonionizing radiation: Secondary | ICD-10-CM | POA: Diagnosis not present

## 2015-10-27 DIAGNOSIS — L570 Actinic keratosis: Secondary | ICD-10-CM | POA: Diagnosis not present

## 2015-10-27 DIAGNOSIS — L820 Inflamed seborrheic keratosis: Secondary | ICD-10-CM | POA: Diagnosis not present

## 2015-10-27 DIAGNOSIS — D1801 Hemangioma of skin and subcutaneous tissue: Secondary | ICD-10-CM | POA: Diagnosis not present

## 2015-10-28 ENCOUNTER — Other Ambulatory Visit: Payer: Self-pay | Admitting: Internal Medicine

## 2015-10-28 ENCOUNTER — Other Ambulatory Visit: Payer: Self-pay | Admitting: Family Medicine

## 2015-11-21 ENCOUNTER — Encounter: Payer: Self-pay | Admitting: Nurse Practitioner

## 2015-11-21 ENCOUNTER — Other Ambulatory Visit: Payer: Self-pay | Admitting: Internal Medicine

## 2015-11-21 NOTE — Progress Notes (Signed)
Electrophysiology Office Note Date: 11/22/2015  ID:  Brittany [name], DOB 21-Oct-1933, MRN [id number]  PCP: Brittany Pouch, DO Electrophysiologist: Brittany [name]  CC: Pacemaker follow-up  Brittany [name] is a 82 y.o. female seen today for Dr Brittany [name].  She presents today for routine electrophysiology followup.  Since last being seen in our clinic, the patient reports doing very well.  She denies chest pain, palpitations, dyspnea, PND, orthopnea, nausea, vomiting, dizziness, syncope, edema, weight gain, or early satiety.  Device History: MDT dual chamber PPM implanted 2012 for sinus node dysfunction   Past Medical History  Diagnosis Date  . Urinary incontinence   . Atrial fibrillation ([place])   . Sinus node dysfunction ([facility])     a. s/p MDT pacemaker  . Osteoporosis   . Depression   . Hypertension   . GERD (gastroesophageal reflux disease)   . History of colon polyps     benign  . Macular degeneration     wet in the right and dry in the left  . OSA on CPAP   . Peripheral vascular disease [facility])    Past Surgical History  Procedure Laterality Date  . Pacemaker placement Right 2012    a. MDT dual chamber PPM implanted by Dr Brittany [name]   . Cataract extraction    . Colonoscopy    . Esophagogastroduodenoscopy    . Shoulder arthroscopy w/ rotator cuff repair Right 08/30/2014    WITH MINI-OPEN ROTATOR CUFF REPAIR AND SUBACROMIAL DECOMPRESSION  . Laparoscopic cholecystectomy  08/11/1999  . Lumbar disc surgery  02/2014  . Back surgery    . Vaginal hysterectomy  1970  . Cataract extraction w/ intraocular lens  implant, bilateral Bilateral   . Shoulder arthroscopy with rotator cuff repair and subacromial decompression Right 08/30/2014    Procedure: SHOULDER ARTHROSCOPY WITH MINI-OPEN ROTATOR CUFF REPAIR AND SUBACROMIAL DECOMPRESSION;  Surgeon: Brittany Pel, MD;  Location: [facility];  Service: Orthopedics;  Laterality: Right;  RIGHT SHOULDER DIAGNOSTIC OPERATIVE ARTHROSCOPY, SUBACROMIAL DECOMPRESSION, MINI-OPEN  ROTATOR CUFF REPAIR.  Marland Kitchen Orif ankle fracture Left 04/13/2015    Procedure: OPEN REDUCTION INTERNAL FIXATION (ORIF) LEFT ANKLE FRACTURE;  Surgeon: Brittany Pel, MD;  Location: [facility];  Service: Orthopedics;  Laterality: Left;    Current Outpatient Prescriptions  Medication Sig Dispense Refill  . amLODipine (NORVASC) 10 MG tablet Take 10 mg by mouth daily.    . B Complex-C (B-COMPLEX WITH VITAMIN C) tablet Take 1 tablet by mouth daily.    . Cholecalciferol (VITAMIN D3) 5000 UNITS CAPS Take 1 Can by mouth daily.    . citalopram (CELEXA) 20 MG tablet TAKE 1 TABLET (20 MG TOTAL) BY MOUTH DAILY. 90 tablet 1  . desmopressin (DDAVP) 0.2 MG tablet Take 1 tablet (0.2 mg total) by mouth daily. 90 tablet 1  . lisinopril (PRINIVIL,ZESTRIL) 40 MG tablet TAKE 1 TABLET BY MOUTH DAILY 30 tablet 5  . magnesium oxide (MAG-OX) 400 MG tablet Take 1 tablet (400 mg total) by mouth daily. 30 tablet 0  . Multiple Vitamin (MULTIVITAMIN WITH MINERALS) TABS tablet Take 1 tablet by mouth daily.    . Multiple Vitamins-Minerals (ICAPS AREDS FORMULA PO) Take 1 capsule by mouth 2 (two) times daily.    Marland Kitchen omeprazole (PRILOSEC) 20 MG capsule Take 1 capsule (20 mg total) by mouth daily. 90 capsule 1  . solifenacin (VESICARE) 10 MG tablet Take 1 tablet (10 mg total) by mouth daily. 90 tablet 1  . sotalol (BETAPACE) 80 MG tablet TAKE 1 AND 1/2 TABLETS BYMOUTH  TWICE A DAY 90 tablet 0   No current facility-administered medications for this visit.    Allergies:   Adhesive; Benzalkonium chloride; and Neosporin   Social History: Social History   Social History  . Marital Status: Married    Spouse Name: N/A  . Number of Children: 4  . Years of Education: 12   Occupational History  . [employer] Manager      retired   Social History Main Topics  . Smoking status: Former Smoker -- 1.00 packs/day for 45 years    Types: Cigarettes  . Smokeless tobacco: Never Used     Comment: quit smoking in 1992  . Alcohol Use: 16.8  oz/week    28 Glasses of wine per week     Comment: 08/30/2014 "16oz wine q night"  . Drug Use: No  . Sexual Activity: Yes    Birth Control/ Protection: Surgical   Other Topics Concern  . Not on file   Social History Narrative   [name] lives with her husband at [employer]. They are retired and recently relocated to [facility] from [place]. She has 4 grown children.    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    Family History: Family History  Problem Relation Age of Onset  . Cancer Mother     colon  . Cancer Father     unknown type  . Stroke Sister   . Skin cancer Brother   . Cancer Paternal Aunt     breast  . Heart disease Brother   . Asthma Brother      Review of Systems: All other systems reviewed and are otherwise negative except as noted above.   Physical Exam: VS:  BP 132/72 mmHg  Pulse 65  Ht 5\' 8"  (1.727 m)  Wt 189 lb 6.4 oz (85.911 kg)  BMI 28.80 kg/m2  LMP 07/22/1968 , BMI Body mass index is 28.8 kg/(m^2).  GEN- The patient is well appearing, alert and oriented x 3 today.   HEENT: normocephalic, atraumatic; sclera clear, conjunctiva pink; hearing intact; oropharynx clear; neck supple  Lungs- Clear to ausculation bilaterally, normal work of breathing.  No wheezes, rales, rhonchi Heart- Regular rate and rhythm  GI- soft, non-tender, non-distended, bowel sounds present Extremities- no clubbing, cyanosis, or edema; L ankle brace MS- no significant deformity or atrophy Skin- warm and dry, no rash or lesion; PPM pocket well healed Psych- euthymic mood, full affect Neuro- strength and sensation are intact  PPM Interrogation- reviewed in detail today,  See PACEART report  EKG:  EKG is ordered today. The ekg ordered today shows atrial pacing with intrinsic ventricular conduction, QTc 457msec  Recent Labs: 04/13/2015: Hemoglobin 14.5; Platelets 227 08/18/2015: ALT 15; BUN 16; BUN 15; Creat 0.62; Creatinine, Ser 0.64;  Potassium 4.3; Potassium 4.9; Sodium 135; Sodium 135; TSH 2.55   Wt Readings from Last 3 Encounters:  11/22/15 189 lb 6.4 oz (85.911 kg)  09/14/15 191 lb (86.637 kg)  08/25/15 192 lb 12 oz (87.431 kg)     Other studies Reviewed: Additional studies/ records that were reviewed today include: Dr Olin Pia office notes  Assessment and Plan:  1.  Sinus node dysfunction Normal PPM function See Pace Art report No changes today  2.  Paroxysmal atrial fibrillation Burden by device interrogation today 0% Continue Sotalol for rhythm control (QTc stable by EKG today, will check BMET, Mg) CHADS2VASC is 4. She has previously declined anticoagulation. Discussed again with patient today who  continues to decline.   3.  OSA Compliance with CPAP encouraged  4.  HTN Stable No change required today   Current medicines are reviewed at length with the patient today.   The patient does not have concerns regarding her medicines.  The following changes were made today:  none  Labs/ tests ordered today include: BMET, Mg   Disposition:   Follow up with [facility] transmissions, Dr Brittany [name] in 1 year   Signed, Chanetta Marshall, NP 11/22/2015 12:05 PM  [facility] [address] [place] [address] [phone] (office) [phone] (fax)

## 2015-11-22 ENCOUNTER — Encounter: Payer: Self-pay | Admitting: Nurse Practitioner

## 2015-11-22 ENCOUNTER — Encounter: Payer: Self-pay | Admitting: Internal Medicine

## 2015-11-22 ENCOUNTER — Ambulatory Visit (INDEPENDENT_AMBULATORY_CARE_PROVIDER_SITE_OTHER): Payer: Medicare Other | Admitting: Nurse Practitioner

## 2015-11-22 VITALS — BP 132/72 | HR 65 | Ht 68.0 in | Wt 189.4 lb

## 2015-11-22 DIAGNOSIS — I10 Essential (primary) hypertension: Secondary | ICD-10-CM

## 2015-11-22 DIAGNOSIS — Z9989 Dependence on other enabling machines and devices: Secondary | ICD-10-CM

## 2015-11-22 DIAGNOSIS — I480 Paroxysmal atrial fibrillation: Secondary | ICD-10-CM

## 2015-11-22 DIAGNOSIS — I495 Sick sinus syndrome: Secondary | ICD-10-CM

## 2015-11-22 DIAGNOSIS — G4733 Obstructive sleep apnea (adult) (pediatric): Secondary | ICD-10-CM

## 2015-11-22 DIAGNOSIS — I4891 Unspecified atrial fibrillation: Secondary | ICD-10-CM | POA: Diagnosis not present

## 2015-11-22 LAB — CUP PACEART INCLINIC DEVICE CHECK
Date Time Interrogation Session: 20170503120512
Implantable Lead Implant Date: 20121213
Implantable Lead Implant Date: 20121213
Implantable Lead Location: 753859
Implantable Lead Location: 753860
Implantable Lead Model: 4076
Implantable Lead Model: 4076
Implantable Lead Serial Number: 835025
Implantable Lead Serial Number: 869269

## 2015-11-22 LAB — BASIC METABOLIC PANEL
BUN: 10 mg/dL (ref 7–25)
CO2: 27 mmol/L (ref 20–31)
Calcium: 10.2 mg/dL (ref 8.6–10.4)
Chloride: 102 mmol/L (ref 98–110)
Creat: 0.63 mg/dL (ref 0.60–0.93)
Glucose, Bld: 99 mg/dL (ref 65–99)
Potassium: 4.7 mmol/L (ref 3.5–5.3)
Sodium: 139 mmol/L (ref 135–146)

## 2015-11-22 LAB — MAGNESIUM: Magnesium: 1.9 mg/dL (ref 1.5–2.5)

## 2015-11-22 NOTE — Patient Instructions (Signed)
Medication Instructions:   Your physician recommends that you continue on your current medications as directed. Please refer to the Current Medication list given to you today.    If you need a refill on your cardiac medications before your next appointment, please call your pharmacy.  Labwork:  BMET AND MAG    Testing/Procedures:  NONE ORDER TODAY    Follow-Up:  Your physician wants you to follow-up in: [place] will receive a reminder letter in the mail two months in advance. If you don't receive a letter, please call our office to schedule the follow-up appointment.   Remote monitoring is used to monitor your Pacemaker of ICD from home. This monitoring reduces the number of office visits required to check your device to one time per year. It allows [place] to keep an eye on the functioning of your device to ensure it is working properly. You are scheduled for a device check from home on . 02/23/16..You may send your transmission at any time that day. If you have a wireless device, the transmission will be sent automatically. After your physician reviews your transmission, you will receive a postcard with your next transmission date.    Any Other Special Instructions Will Be Listed Below (If Applicable).

## 2015-11-27 ENCOUNTER — Telehealth: Payer: Self-pay | Admitting: *Deleted

## 2015-11-27 NOTE — Telephone Encounter (Signed)
-----   Message from Patsey Berthold, NP sent at 11/22/2015  6:36 PM EDT ----- Please notify patient of stable labs

## 2015-11-28 ENCOUNTER — Ambulatory Visit (INDEPENDENT_AMBULATORY_CARE_PROVIDER_SITE_OTHER): Payer: Medicare Other | Admitting: Family Medicine

## 2015-11-28 ENCOUNTER — Encounter: Payer: Self-pay | Admitting: Family Medicine

## 2015-11-28 VITALS — BP 137/79 | HR 70 | Temp 98.3°F | Resp 20 | Ht 68.0 in | Wt 186.2 lb

## 2015-11-28 DIAGNOSIS — I480 Paroxysmal atrial fibrillation: Secondary | ICD-10-CM

## 2015-11-28 DIAGNOSIS — I10 Essential (primary) hypertension: Secondary | ICD-10-CM

## 2015-11-28 DIAGNOSIS — I495 Sick sinus syndrome: Secondary | ICD-10-CM | POA: Diagnosis not present

## 2015-11-28 MED ORDER — LISINOPRIL 40 MG PO TABS: 40.0000 mg | ORAL_TABLET | Freq: Every day | ORAL | Status: DC

## 2015-11-28 MED ORDER — AMLODIPINE BESYLATE 10 MG PO TABS: 10.0000 mg | ORAL_TABLET | Freq: Every day | ORAL | Status: DC

## 2015-11-28 NOTE — Progress Notes (Signed)
Patient ID: Brittany [name], female   DOB: 07/27/1933, 82 y.o.   MRN: [id number]   Subjective:    Patient ID: Brittany PolesCarol Bunyard, female    DOB: 03/18/1934, 82 y.o.   MRN: [id number]  HPI  Hypertension: Patient presents for follow-up on hypertension. She currently is taking lisinopril 40 mg daily and Norvasc 10 mg daily. She is on Betapace for her atrial fibrillation. After her last appointment she was found to still be hypertensive in medication was increased at that time. Patient states she is tolerating the medication well. Her blood pressures have below low 140/90, and she is very pleased about this today. She states even at her other doctor's office appointment since the changes in her regimen her blood pressure has been "beautiful". She continues to deny any chest pain, lower extremity edema, visual changes or headaches.  Past Medical History  Diagnosis Date  . Urinary incontinence   . Atrial fibrillation (HCC)   . Sinus node dysfunction (HCC)     a. s/p MDT pacemaker  . Osteoporosis   . Depression   . Hypertension   . GERD (gastroesophageal reflux disease)   . History of colon polyps     benign  . Macular degeneration     wet in the right and dry in the left  . OSA on CPAP   . Peripheral vascular disease (HCC)   . Elevated parathyroid hormone   . PSVT (paroxysmal supraventricular tachycardia) (HCC)   . Fracture of orbital floor with routine healing   . Rotator cuff tear   . Hyponatremia   . Hypercalcemia   . Hypothyroid    Allergies  Allergen Reactions  . Adhesive [Tape] Swelling and Rash  . Benzalkonium Chloride Rash  . Neosporin [Neomycin-Polymyxin-Gramicidin] Swelling and Rash     Medication List       This list is accurate as of: 11/28/15  6:29 PM.  Always use your most recent med list.               amLODipine 10 MG tablet  Commonly known as:  NORVASC  Take 1 tablet (10 mg total) by mouth daily.     B-complex with vitamin C tablet  Take 1 tablet by mouth daily.       citalopram 20 MG tablet  Commonly known as:  CELEXA  TAKE 1 TABLET (20 MG TOTAL) BY MOUTH DAILY.     desmopressin 0.2 MG tablet  Commonly known as:  DDAVP  Take 1 tablet (0.2 mg total) by mouth daily.     ICAPS AREDS FORMULA PO  Take 1 capsule by mouth 2 (two) times daily.     lisinopril 40 MG tablet  Commonly known as:  PRINIVIL,ZESTRIL  Take 1 tablet (40 mg total) by mouth daily.     magnesium oxide 400 MG tablet  Commonly known as:  MAG-OX  Take 1 tablet (400 mg total) by mouth daily.     multivitamin with minerals Tabs tablet  Take 1 tablet by mouth daily.     omeprazole 20 MG capsule  Commonly known as:  PRILOSEC  Take 1 capsule (20 mg total) by mouth daily.     solifenacin 10 MG tablet  Commonly known as:  VESICARE  Take 1 tablet (10 mg total) by mouth daily.     sotalol 80 MG tablet  Commonly known as:  BETAPACE  TAKE 1 AND 1/2 TABLETS BYMOUTH TWICE A DAY     Vitamin D3 5000 units Caps  Take 1  Can by mouth daily.         Review of Systems Negative, with the exception of above mentioned in HPI     Objective:   Physical Exam BP 137/79 mmHg  Pulse 70  Temp(Src) 98.3 F (36.8 C) (Oral)  Resp 20  Ht 5\' 8"  (1.727 m)  Wt 186 lb 4 oz (84.482 kg)  BMI 28.33 kg/m2  SpO2 98%  LMP 07/22/1968 Gen: Afebrile. No acute distress. Nontoxic in appearance, well-developed, well-nourished, Caucasian female. Mildly overweight. HENT: AT. [place].  MMM.  Eyes:Pupils Equal Round Reactive to light, Extraocular movements intact,  Conjunctiva without redness, discharge or icterus. Neck/lymp/endocrine: Supple, no thyromegaly CV: RRR, no edema, +2/4 P posterior tibialis pulses Chest: CTAB, no wheeze or crackles      Assessment & Plan:   Essential hypertension, benign/Sinus node dysfunction/atrial fibrillation - Patient now taking lisinopril 40 mg and Norvasc 10 mg daily.  - Continue Betapace for atrial fibrillation/sinus node dysfunction - Blood pressure is  well-controlled today . - Continue lisinopril 40 mg and Norvasc 10 mg, refills placed today if needed.  - Follow-up in 6 months for hypertension, or Medicare wellness (whichever is due at that time).   Electronically Signed by: Howard Pouch, DO [facility] [place]

## 2015-11-28 NOTE — Patient Instructions (Signed)
Schedule medicare wellness after October 9th. We will update all health  Maintenance and get labs at that time.   Your BP looks great!!!

## 2015-11-29 ENCOUNTER — Other Ambulatory Visit: Payer: Self-pay | Admitting: Internal Medicine

## 2015-11-29 DIAGNOSIS — R918 Other nonspecific abnormal finding of lung field: Secondary | ICD-10-CM

## 2016-01-01 ENCOUNTER — Ambulatory Visit (INDEPENDENT_AMBULATORY_CARE_PROVIDER_SITE_OTHER)
Admission: RE | Admit: 2016-01-01 | Discharge: 2016-01-01 | Disposition: A | Discharge status: Home / Self Care | Payer: Medicare Other | Source: Ambulatory Visit | Attending: Internal Medicine | Admitting: Internal Medicine

## 2016-01-01 DIAGNOSIS — R918 Other nonspecific abnormal finding of lung field: Secondary | ICD-10-CM

## 2016-01-02 ENCOUNTER — Encounter: Payer: Self-pay | Admitting: Family Medicine

## 2016-01-02 DIAGNOSIS — I2510 Atherosclerotic heart disease of native coronary artery without angina pectoris: Secondary | ICD-10-CM | POA: Insufficient documentation

## 2016-01-02 NOTE — Progress Notes (Signed)
Quick Note:  Spoke with pt and notified of results per Dr. Wert. Pt verbalized understanding and denied any questions.  ______ 

## 2016-01-31 DIAGNOSIS — S82842S Displaced bimalleolar fracture of left lower leg, sequela: Secondary | ICD-10-CM | POA: Diagnosis not present

## 2016-02-23 DIAGNOSIS — L570 Actinic keratosis: Secondary | ICD-10-CM | POA: Diagnosis not present

## 2016-02-23 DIAGNOSIS — L814 Other melanin hyperpigmentation: Secondary | ICD-10-CM | POA: Diagnosis not present

## 2016-03-07 DIAGNOSIS — H353212 Exudative age-related macular degeneration, right eye, with inactive choroidal neovascularization: Secondary | ICD-10-CM | POA: Diagnosis not present

## 2016-03-07 DIAGNOSIS — H353124 Nonexudative age-related macular degeneration, left eye, advanced atrophic with subfoveal involvement: Secondary | ICD-10-CM | POA: Diagnosis not present

## 2016-03-07 DIAGNOSIS — H43813 Vitreous degeneration, bilateral: Secondary | ICD-10-CM | POA: Diagnosis not present

## 2016-03-07 DIAGNOSIS — H35372 Puckering of macula, left eye: Secondary | ICD-10-CM | POA: Diagnosis not present

## 2016-03-28 IMAGING — MG MM DIGITAL SCREENING BILAT W/ TOMO W/ CAD
12 series · 12 of 28 positions shown · non-contrast
Comparison: Previous exam(s).

CLINICAL DATA: Screening.

EXAM:
DIGITAL SCREENING BILATERAL MAMMOGRAM WITH 3D TOMO WITH CAD

[R MLO]
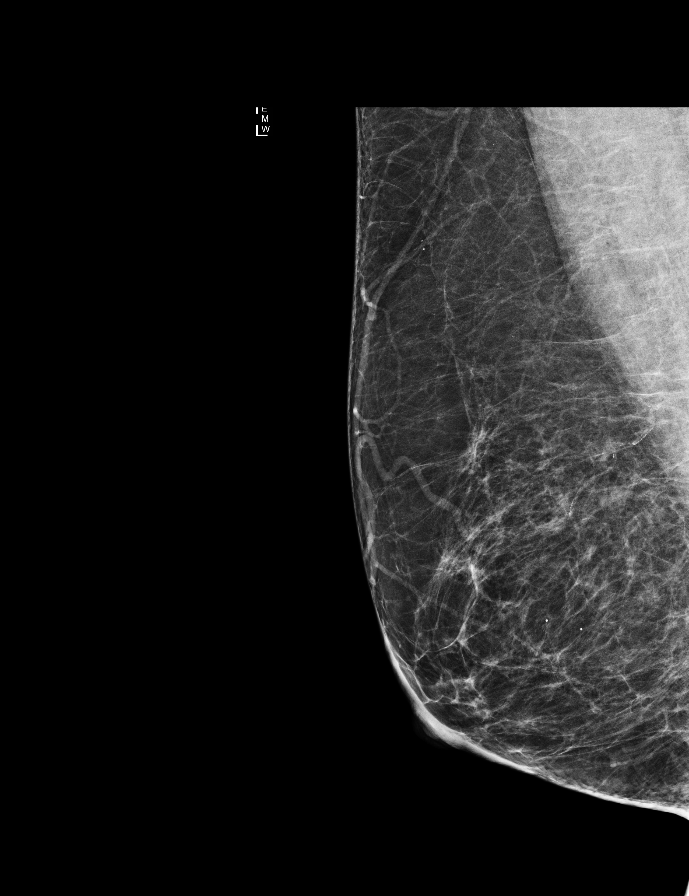

[R CC]
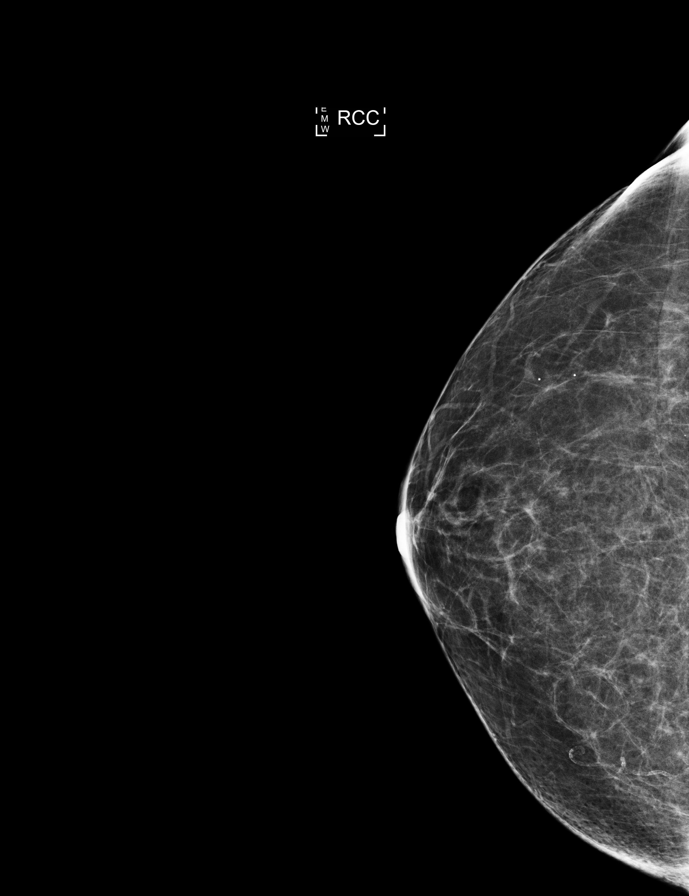

[L MLO]
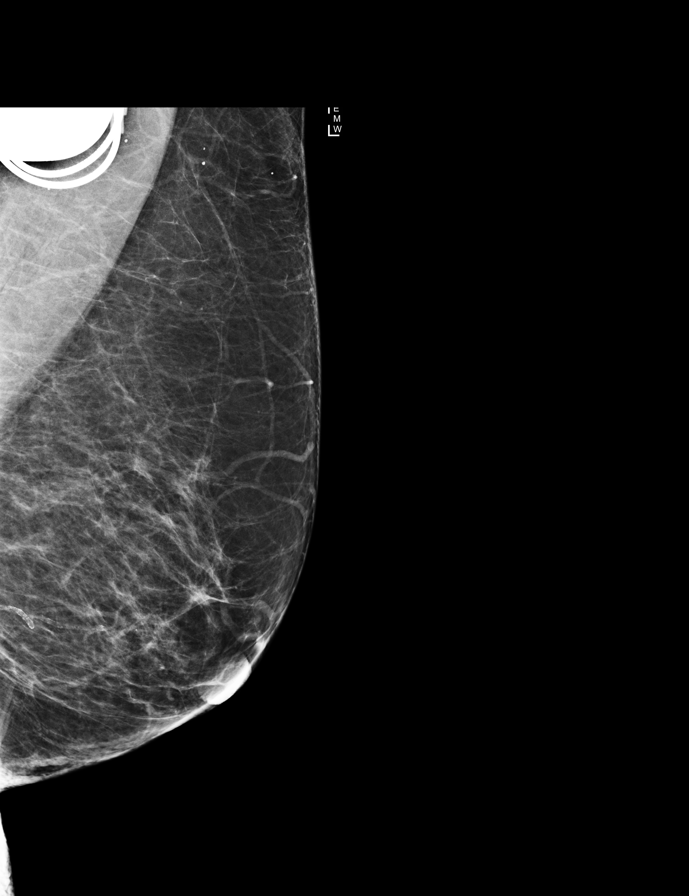

[L CC]
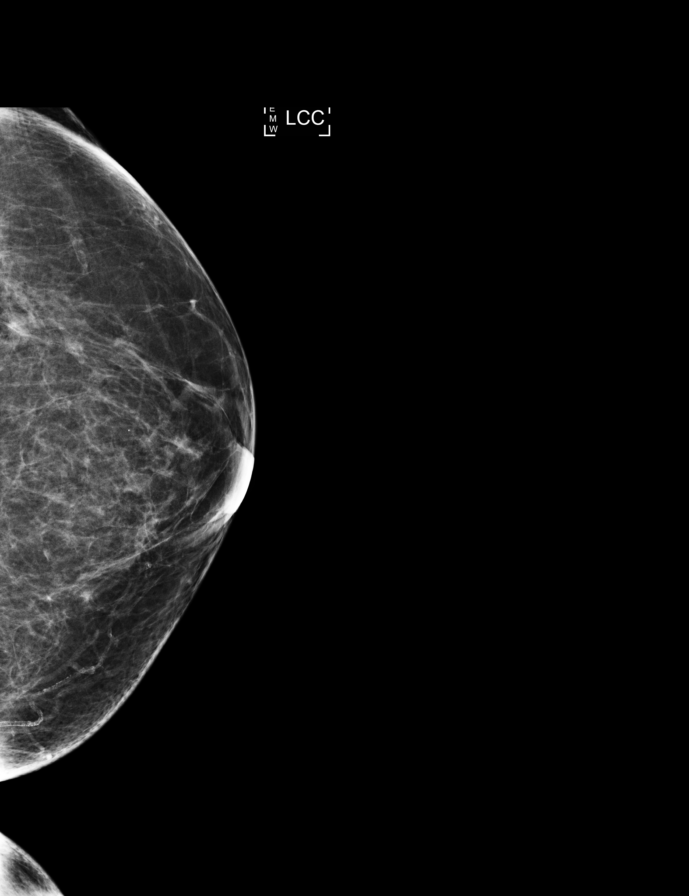

[L CC tomo (1 of 2)]
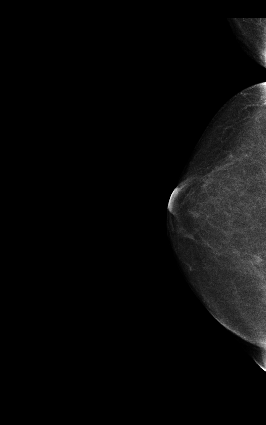

[L MLO tomo (1 of 2)]
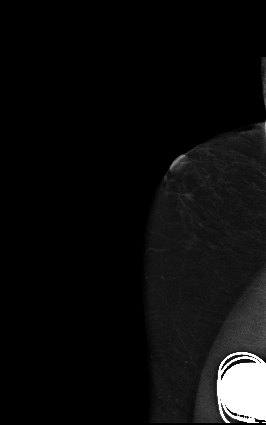

[R MLO tomo (1 of 2)]
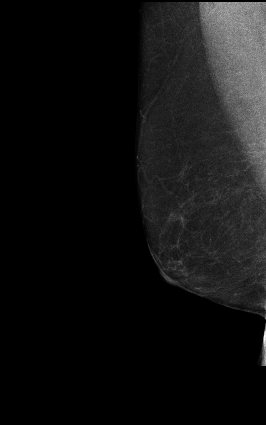

[R CC tomo (1 of 2)]
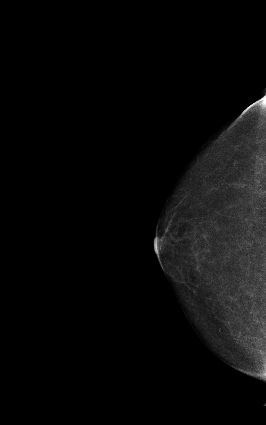

[L MLO tomo (2 of 2) · tomo slice 37/73.0]
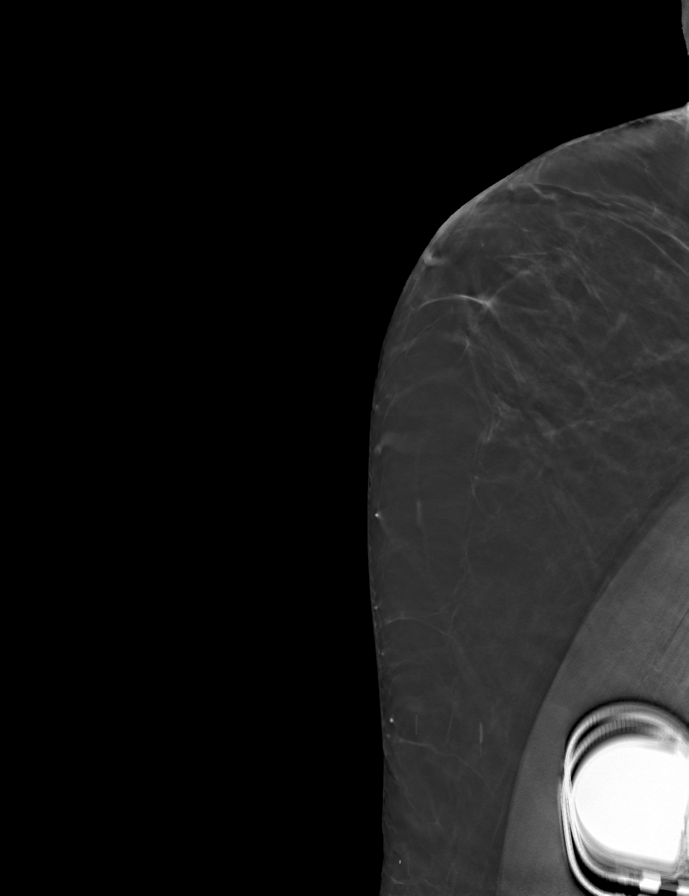

[R CC tomo (2 of 2) · tomo slice 31/62.0]
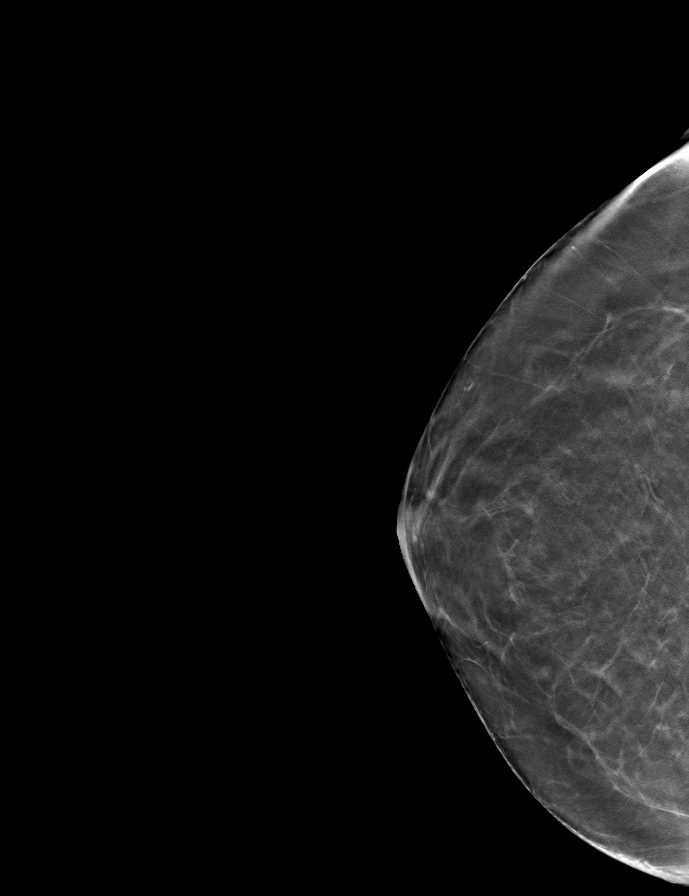

[L CC tomo (2 of 2) · tomo slice 33/66.0]
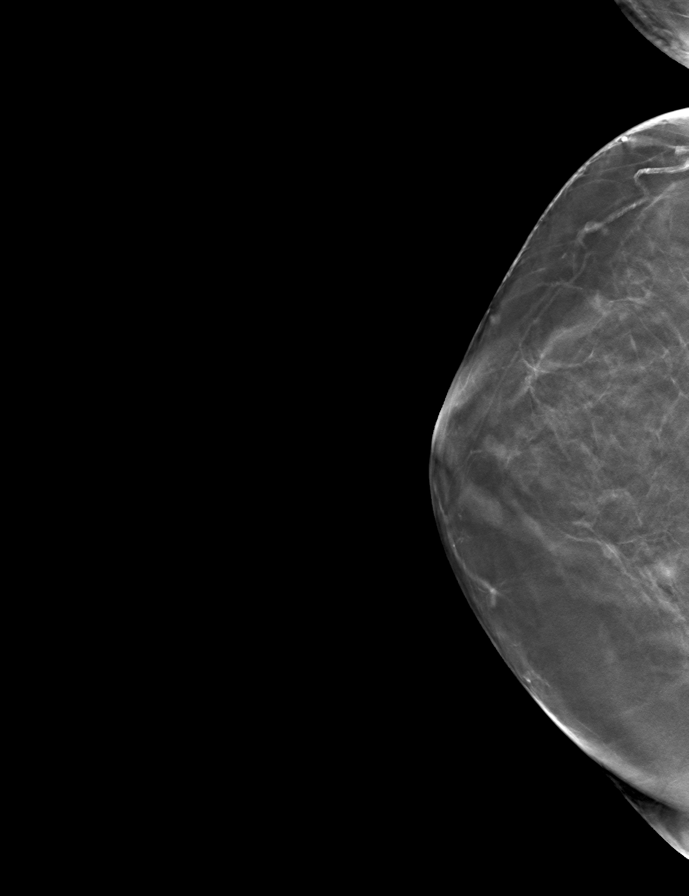

[R MLO tomo (2 of 2) · tomo slice 33/65.0]
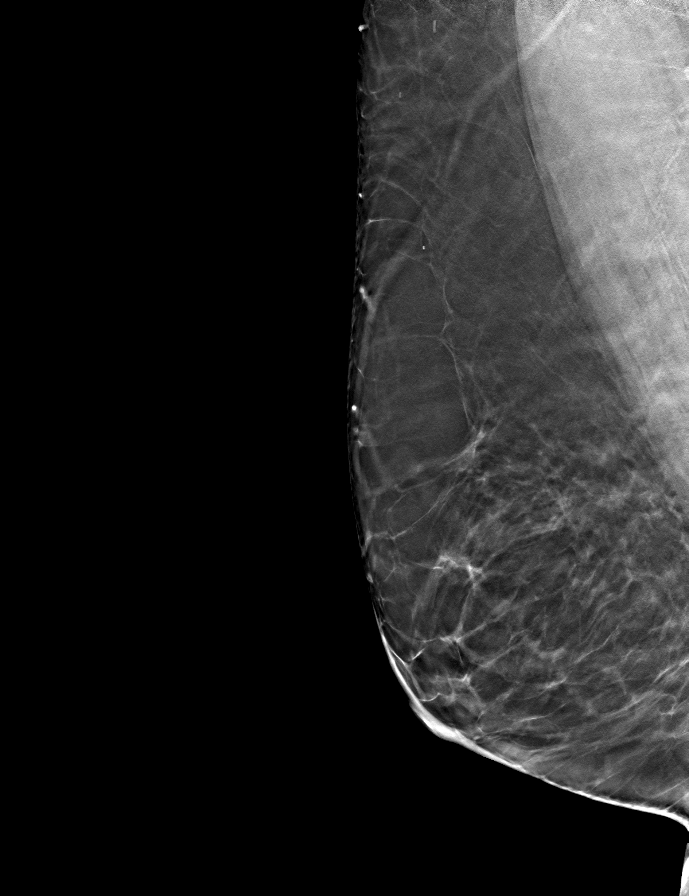

[12 of 28 positions shown; findings below may reference images not displayed]

ACR Breast Density Category b: There are scattered areas of
fibroglandular density.
FINDINGS: There are no findings suspicious for malignancy. Images were
processed with CAD.
IMPRESSION: No mammographic evidence of malignancy. A result letter of this
screening mammogram will be mailed directly to the patient.

RECOMMENDATION:
Screening mammogram in one year. (Code:55-L-23V)

BI-RADS CATEGORY  1: Negative.

## 2016-04-08 ENCOUNTER — Other Ambulatory Visit: Payer: Self-pay | Admitting: Family Medicine

## 2016-04-08 DIAGNOSIS — Z1231 Encounter for screening mammogram for malignant neoplasm of breast: Secondary | ICD-10-CM

## 2016-04-10 DIAGNOSIS — R351 Nocturia: Secondary | ICD-10-CM | POA: Diagnosis not present

## 2016-04-10 DIAGNOSIS — N3941 Urge incontinence: Secondary | ICD-10-CM | POA: Diagnosis not present

## 2016-04-12 ENCOUNTER — Ambulatory Visit (INDEPENDENT_AMBULATORY_CARE_PROVIDER_SITE_OTHER): Payer: Medicare Other | Admitting: Family Medicine

## 2016-04-12 ENCOUNTER — Encounter: Payer: Self-pay | Admitting: Family Medicine

## 2016-04-12 VITALS — Temp 97.9°F | Resp 20 | Ht 68.0 in | Wt 194.5 lb

## 2016-04-12 DIAGNOSIS — Z23 Encounter for immunization: Secondary | ICD-10-CM

## 2016-04-12 DIAGNOSIS — R42 Dizziness and giddiness: Secondary | ICD-10-CM | POA: Diagnosis not present

## 2016-04-12 LAB — BASIC METABOLIC PANEL
BUN: 11 mg/dL (ref 6–23)
CO2: 28 mEq/L (ref 19–32)
Calcium: 10 mg/dL (ref 8.4–10.5)
Chloride: 102 mEq/L (ref 96–112)
Creatinine, Ser: 0.57 mg/dL (ref 0.40–1.20)
GFR: 108.48 mL/min (ref >60.00)
Glucose, Bld: 101 mg/dL — ABNORMAL HIGH (ref 70–99)
Potassium: 4.6 mEq/L (ref 3.5–5.1)
Sodium: 136 mEq/L (ref 135–145)

## 2016-04-12 MED ORDER — ASPIRIN EC 81 MG PO TBEC: 81.0000 mg | DELAYED_RELEASE_TABLET | Freq: Every day | ORAL | Status: DC

## 2016-04-12 NOTE — Progress Notes (Signed)
Subjective:    Patient ID: Brittany [name], female    DOB: [birth year], 82 y.o.   MRN: [id number]  HPI CC: dizziness  Dizziness/lightheadess: pt resents to OV for complaints of dizziness for the past few weeks. The dizziness occurs if standing up too quickly and leaning forward.  She had similar symptoms last year related to hyponatremia. HCTZ was discontinued at that time and issue resolved. She reports he dizziness is lightheadedness, not a spinning feature. She has had no recent illnesses or URI. She has had no changes in medications. She has a h/o HTN, A.Fib, CAD, sinus node dysfunction, depression (SSRI) and she has a pacemaker. She is on desmopressin and vesicare through urology for urinary incontenence. She has no bladder or bowel changes. She does not drink water, her fluid intake consist of ice tea, coffee, one sprite and one glass of wine in a day. She is prescribed lisinopril, norvasc and sotalol for her blood pressure/A.fib.   Past Medical History:  Diagnosis Date  . Atrial fibrillation ([place])   . Depression   . Elevated parathyroid hormone   . Fracture of orbital floor with routine healing   . GERD (gastroesophageal reflux disease)   . History of colon polyps    benign  . Hypercalcemia   . Hypertension   . Hyponatremia   . Hypothyroid   . Macular degeneration    wet in the right and dry in the left  . OSA on CPAP   . Osteoporosis   . Peripheral vascular disease ([place])   . PSVT (paroxysmal supraventricular tachycardia) ([place])   . Rotator cuff tear   . Sinus node dysfunction (HCC)    a. s/p MDT pacemaker  . Urinary incontinence    Allergies  Allergen Reactions  . Adhesive [Tape] Swelling and Rash  . Benzalkonium Chloride Rash  . Neosporin [Neomycin-Polymyxin-Gramicidin] Swelling and Rash   Social History   Social History  . Marital status: Married    Spouse name: N/A  . Number of children: 4  . Years of education: 12   Occupational History  . [employer] Manager       retired   Social History Main Topics  . Smoking status: Former Smoker    Packs/day: 1.00    Years: 45.00    Types: Cigarettes  . Smokeless tobacco: Never Used     Comment: quit smoking in 1992  . Alcohol use 16.8 oz/week    28 Glasses of wine per week     Comment: 08/30/2014 "16oz wine q night"  . Drug use: No  . Sexual activity: Yes    Birth control/ protection: Surgical   Other Topics Concern  . Not on file   Social History Narrative   [name] lives with her husband at [employer]. They are retired and recently relocated to [facility] from [place]. She has 4 grown children.    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     Review of Systems  Neurological: Positive for dizziness.   Negative, with the exception of above mentioned in HPI     Objective:   Physical Exam Temp 97.9 F (36.6 C)   Resp 20   Ht 5\' 8"  (1.727 m)   Wt 194 lb 8 oz (88.2 kg)   LMP 07/22/1968   SpO2 96%   BMI 29.57 kg/m   Orthostatic VS: 125-129-119/75-80-76  HR: [id number] Gen: Afebrile. No acute distress. Nontoxic in appearance. Well-developed, well-nourished, Caucasian female. HENT: AT.  [place]. MMM.  Eyes:Pupils Equal Round Reactive to light, Extraocular movements intact, Conjunctiva without redness, discharge or icterus. CV: RRR no murmur, trace edema Chest: CTAB, no wheeze or crackles Neuro: Normal gait. PERLA. EOMi. Alert. Orientedx3. Mild dizziness upon  Standing. CN 2-12 intact. Neg [place].  Psych: Normal affect, dress and demeanor. Normal speech. Normal thought content and judgment..     Assessment & Plan:  Brittany [name] is a 82 y.o. female present for dizziness.  Dizziness:  - similar symptoms last year were thought to be secondary to hyponatremia.  - BMP collected today - pt advised to hydrate, drink more water, at least 2 bottles of water a day.  - Discussed orthostatic hypotension and prevention, safety.  - F/U dependent on lab results and if  condition worsening.    Electronically Signed by: Howard Pouch, DO [facility] [place]

## 2016-04-12 NOTE — Patient Instructions (Signed)
We will collect some labs to make sure it is not your electrolytes off causing the dizziness.  Make certain to drink at least 2 - 20 ounce bottle of water daily to what you are already drinking.  When standing or leaning forward, take your time and wait until you have your footing before walking.    Orthostatic Hypotension Orthostatic hypotension is a sudden drop in blood pressure. It happens when you quickly stand up from a seated or lying position. You may feel dizzy or light-headed. This can last for just a few seconds or for up to a few minutes. It is usually not a serious problem. However, if this happens frequently or gets worse, it can be a sign of something more serious. CAUSES  Different things can cause orthostatic hypotension, including:   Loss of body fluids (dehydration).  Medicines that lower blood pressure.  Sudden changes in posture, such as standing up quickly after you have been sitting or lying down.  Taking too much of your medicine. SIGNS AND SYMPTOMS   Light-headedness or dizziness.   Fainting or near-fainting.   A fast heart rate.   Weakness.   Feeling tired (fatigue).  DIAGNOSIS  Your health care provider may do several things to help diagnose your condition and identify the cause. These may include:   Taking a medical history and doing a physical exam.  Checking your blood pressure. Your health care provider will check your blood pressure when you are:  Lying down.  Sitting.  Standing.  Using tilt table testing. In this test, you lie down on a table that moves from a lying position to a standing position. You will be strapped onto the table. This test monitors your blood pressure and heart rate when you are in different positions. TREATMENT  Treatment will vary depending on the cause. Possible treatments include:   Changing the dosage of your medicines.  Wearing compression stockings on your lower legs.  Standing up slowly after sitting  or lying down.  Eating more salt.  Eating frequent, small meals.  In some cases, getting IV fluids.  Taking medicine to enhance fluid retention. HOME CARE INSTRUCTIONS  Only take over-the-counter or prescription medicines as directed by your health care provider.  Follow your health care provider's instructions for changing the dosage of your current medicines.  Do not stop or adjust your medicine on your own.  Stand up slowly after sitting or lying down. This allows your body to adjust to the different position.  Wear compression stockings as directed.  Eat extra salt as directed.  Do not add extra salt to your diet unless directed to by your health care provider.  Eat frequent, small meals.  Avoid standing suddenly after eating.  Avoid hot showers or excessive heat as directed by your health care provider.  Keep all follow-up appointments. SEEK MEDICAL CARE IF:  You continue to feel dizzy or light-headed after standing.  You feel groggy or confused.  You feel cold, clammy, or sick to your stomach (nauseous).  You have blurred vision.  You feel short of breath. SEEK IMMEDIATE MEDICAL CARE IF:   You faint after standing.  You have chest pain.  You have difficulty breathing.   You lose feeling or movement in your arms or legs.   You have slurred speech or difficulty talking, or you are unable to talk.  MAKE SURE YOU:   Understand these instructions.  Will watch your condition.  Will get help right away if you  are not doing well or get worse.   This information is not intended to replace advice given to you by your health care provider. Make sure you discuss any questions you have with your health care provider.   Document Released: 06/28/2002 Document Revised: 07/13/2013 Document Reviewed: 04/30/2013 Elsevier Interactive Patient Education [employer].

## 2016-04-15 ENCOUNTER — Telehealth: Payer: Self-pay | Admitting: Family Medicine

## 2016-04-15 NOTE — Telephone Encounter (Signed)
Spoke with patient reviewed lab results and instructions. Patient verbalized understanding. She will call back if no improvement after hydration.

## 2016-04-15 NOTE — Telephone Encounter (Signed)
Please call Brittany [name]: - her sodium is normal. Blood work normal.  - Her dizziness may be secondary to decreased fluid intake (encourage hydration) or her one of her medications as discussed prior.  - If worsening could send to neurology for further evaluation, would want her to try hydration first.

## 2016-04-26 ENCOUNTER — Ambulatory Visit (HOSPITAL_BASED_OUTPATIENT_CLINIC_OR_DEPARTMENT_OTHER)
Admission: RE | Admit: 2016-04-26 | Discharge: 2016-04-26 | Disposition: A | Discharge status: Home / Self Care | Payer: Medicare Other | Source: Ambulatory Visit | Attending: Family Medicine | Admitting: Family Medicine

## 2016-04-26 DIAGNOSIS — Z1231 Encounter for screening mammogram for malignant neoplasm of breast: Secondary | ICD-10-CM | POA: Diagnosis not present

## 2016-04-29 ENCOUNTER — Ambulatory Visit: Payer: Medicare Other | Admitting: Family Medicine

## 2016-04-30 ENCOUNTER — Ambulatory Visit: Payer: Medicare Other | Admitting: Family Medicine

## 2016-05-14 ENCOUNTER — Telehealth: Payer: Self-pay

## 2016-05-14 NOTE — Telephone Encounter (Signed)
LM requesting return call regarding appointment time change (from 1p to 2p) on same day (05/24/16).

## 2016-05-20 ENCOUNTER — Ambulatory Visit: Payer: Medicare Other

## 2016-05-20 DIAGNOSIS — M9904 Segmental and somatic dysfunction of sacral region: Secondary | ICD-10-CM | POA: Diagnosis not present

## 2016-05-20 DIAGNOSIS — M9903 Segmental and somatic dysfunction of lumbar region: Secondary | ICD-10-CM | POA: Diagnosis not present

## 2016-05-20 DIAGNOSIS — M5440 Lumbago with sciatica, unspecified side: Secondary | ICD-10-CM | POA: Diagnosis not present

## 2016-05-20 DIAGNOSIS — M545 Low back pain: Secondary | ICD-10-CM | POA: Diagnosis not present

## 2016-05-23 DIAGNOSIS — M9904 Segmental and somatic dysfunction of sacral region: Secondary | ICD-10-CM | POA: Diagnosis not present

## 2016-05-23 DIAGNOSIS — M9903 Segmental and somatic dysfunction of lumbar region: Secondary | ICD-10-CM | POA: Diagnosis not present

## 2016-05-23 DIAGNOSIS — M5440 Lumbago with sciatica, unspecified side: Secondary | ICD-10-CM | POA: Diagnosis not present

## 2016-05-23 DIAGNOSIS — M545 Low back pain: Secondary | ICD-10-CM | POA: Diagnosis not present

## 2016-05-24 ENCOUNTER — Ambulatory Visit (INDEPENDENT_AMBULATORY_CARE_PROVIDER_SITE_OTHER): Payer: Medicare Other

## 2016-05-24 VITALS — BP 142/70 | HR 73 | Ht 68.0 in | Wt 196.8 lb

## 2016-05-24 DIAGNOSIS — Z0000 Encounter for general adult medical examination without abnormal findings: Secondary | ICD-10-CM

## 2016-05-24 NOTE — Progress Notes (Signed)
Pre visit review using our clinic review tool, if applicable. No additional management support is needed unless otherwise documented below in the visit note. 

## 2016-05-24 NOTE — Progress Notes (Addendum)
Subjective:   Brittany [name] is a 82 y.o. female who presents for an Initial Medicare Annual Wellness Visit.  The Patient was informed that the wellness visit is to identify future health risk and educate and initiate measures that can reduce risk for increased disease through the lifespan.   Describes health as fair, good or great? "good"  Review of Systems    No ROS.  Medicare Wellness Visit.     Home Safety/Smoke Alarms: smoke detector and security in place.  Living environment; residence and Firearm Safety: Lives in apartment with husband at [facility] ([facility]) on third floor, uses Media planner. Feel safe in community. No firearms.  Seat Belt Safety/Bike Helmet: Wears seatbelt.  Sleep patterns: Uses CPAP, sleeps 8-9 hours, up 3 times to void.   Screening Exams:   Eye Exam-Last exam within the last year, followed every 6 months. Unable to recall providers name.  Dental-Last exam >1 year, only sees dentist with problems due to no coverage.   Female:   Pap-N/A, last 2013. Hysterectomy      Mammo-04/26/16, negative. Recall 1 year.        Dexa scan-03/02/13, osteopenia. Recall 2 years. Taking Vitamin D supplement. Declines further testing.  [facility]S-colonoscopy 2013, normal per patient.      Objective:    Today's Vitals   05/24/16 1415  BP: (!) 142/70  Pulse: 73  SpO2: 97%  Weight: 196 lb 12.8 oz (89.3 kg)  Height: 5\' 8"  (1.727 m)   Body mass index is 29.92 kg/m.   Current Medications (verified) Outpatient Encounter Prescriptions as of 05/24/2016  Medication Sig  . amLODipine (NORVASC) 10 MG tablet Take 1 tablet (10 mg total) by mouth daily.  Marland Kitchen aspirin EC 81 MG tablet Take 1 tablet (81 mg total) by mouth daily.  . B Complex-C (B-COMPLEX WITH VITAMIN C) tablet Take 1 tablet by mouth daily.  . Cholecalciferol (VITAMIN D3) 5000 UNITS CAPS Take 1 Can by mouth daily.  . citalopram (CELEXA) 20 MG tablet TAKE 1 TABLET (20 MG TOTAL) BY MOUTH DAILY.    Marland Kitchen desmopressin (DDAVP) 0.2 MG tablet Take 1 tablet (0.2 mg total) by mouth daily.  Marland Kitchen lisinopril (PRINIVIL,ZESTRIL) 40 MG tablet Take 1 tablet (40 mg total) by mouth daily.  . magnesium oxide (MAG-OX) 400 MG tablet Take 1 tablet (400 mg total) by mouth daily.  . Multiple Vitamin (MULTIVITAMIN WITH MINERALS) TABS tablet Take 1 tablet by mouth daily.  . Multiple Vitamins-Minerals (PRESERVISION AREDS PO) Take 1 tablet by mouth 2 (two) times daily.  . solifenacin (VESICARE) 10 MG tablet Take 1 tablet (10 mg total) by mouth daily.  . sotalol (BETAPACE) 80 MG tablet TAKE 1 AND 1/2 TABLETS BYMOUTH TWICE A DAY  . omeprazole (PRILOSEC) 20 MG capsule Take 1 capsule (20 mg total) by mouth daily. (Patient not taking: Reported on 05/24/2016)   No facility-administered encounter medications on file as of 05/24/2016.     Allergies (verified) Adhesive [tape]; Benzalkonium chloride; and Neosporin [neomycin-polymyxin-gramicidin]   History: Past Medical History:  Diagnosis Date  . Atrial fibrillation ([place])   . Depression   . Elevated parathyroid hormone   . Fracture of orbital floor with routine healing   . GERD (gastroesophageal reflux disease)   . History of colon polyps    benign  . Hypercalcemia   . Hypertension   . Hyponatremia   . Hypothyroid   . Macular degeneration    wet in the right and dry in the left  .  OSA on CPAP   . Osteoporosis   . Peripheral vascular disease ([place])   . PSVT (paroxysmal supraventricular tachycardia) ([place])   . Rotator cuff tear   . Sinus node dysfunction ([facility])    a. s/p MDT pacemaker  . Urinary incontinence    Past Surgical History:  Procedure Laterality Date  . BACK SURGERY    . CATARACT EXTRACTION    . CATARACT EXTRACTION W/ INTRAOCULAR LENS  IMPLANT, BILATERAL Bilateral   . COLONOSCOPY    . ESOPHAGOGASTRODUODENOSCOPY    . LAPAROSCOPIC CHOLECYSTECTOMY  08/11/1999  . LUMBAR DISC SURGERY  02/2014  . ORIF ANKLE FRACTURE Left 04/13/2015   Procedure: OPEN  REDUCTION INTERNAL FIXATION (ORIF) LEFT ANKLE FRACTURE;  Surgeon: Meredith Pel, MD;  Location: [facility];  Service: Orthopedics;  Laterality: Left;  . PACEMAKER PLACEMENT Right 2012   a. MDT dual chamber PPM implanted by Dr Caryl Comes   . SHOULDER ARTHROSCOPY W/ ROTATOR CUFF REPAIR Right 08/30/2014   WITH MINI-OPEN ROTATOR CUFF REPAIR AND SUBACROMIAL DECOMPRESSION  . SHOULDER ARTHROSCOPY WITH ROTATOR CUFF REPAIR AND SUBACROMIAL DECOMPRESSION Right 08/30/2014   Procedure: SHOULDER ARTHROSCOPY WITH MINI-OPEN ROTATOR CUFF REPAIR AND SUBACROMIAL DECOMPRESSION;  Surgeon: Meredith Pel, MD;  Location: [facility];  Service: Orthopedics;  Laterality: Right;  RIGHT SHOULDER DIAGNOSTIC OPERATIVE ARTHROSCOPY, SUBACROMIAL DECOMPRESSION, MINI-OPEN ROTATOR CUFF REPAIR.  Marland Kitchen VAGINAL HYSTERECTOMY  1970   Family History  Problem Relation Age of Onset  . Cancer Mother     colon  . Cancer Father     unknown type  . Stroke Sister   . Skin cancer Brother   . Heart disease Brother   . Cancer Paternal Aunt     breast  . Asthma Brother    Social History   Occupational History  . Personell Manager      retired   Social History Main Topics  . Smoking status: Former Smoker    Packs/day: 1.00    Years: 45.00    Types: Cigarettes  . Smokeless tobacco: Never Used     Comment: quit smoking in 1992  . Alcohol use 16.8 oz/week    28 Glasses of wine per week     Comment: 08/30/2014 "16oz wine q night"  . Drug use: No  . Sexual activity: Yes    Birth control/ protection: Surgical    Tobacco Counseling Counseling given: Not Answered   Activities of Daily Living In your present state of health, do you have any difficulty performing the following activities: 05/24/2016 05/24/2016  Hearing? N N  Vision? N N  Difficulty concentrating or making decisions? N -  Walking or climbing stairs? Y -  Dressing or bathing? N -  Doing errands, shopping? N -  Preparing Food and eating ? N -  Using the Toilet? N -  In the  past six months, have you accidently leaked urine? N -  Do you have problems with loss of bowel control? N -  Managing your Medications? N -  Managing your Finances? N -  Housekeeping or managing your Housekeeping? N -  Some recent data might be hidden    Immunizations and Health Maintenance Immunization History  Administered Date(s) Administered  . Influenza Split 07/01/2013, 04/21/2014  . Influenza, High Dose Seasonal PF 04/12/2016  . Influenza-Unspecified 05/06/2015  . Pneumococcal Conjugate-13 05/18/2014  . Pneumococcal Polysaccharide-23 07/23/2011  . Tdap 07/22/2012   Health Maintenance Due  Topic Date Due  . ZOSTAVAX  04/28/1996    Patient Care Team: Joseph Art A  Raoul Pitch, DO as PCP - General (Family Medicine) Bjorn Loser, MD as Consulting Physician (Urology) Deboraha Sprang, MD as Consulting Physician (Cardiology) Chesley Mires, MD as Consulting Physician (Pulmonary Disease)  Indicate any recent Medical Services you may have received from other than [facility] providers in the past year (date may be approximate).     Assessment:   This is a routine wellness examination for Brittany [name]. Physical assessment deferred to PCP.   Hearing/Vision screen Hearing Screening Comments: Last tested >5 years. Denies problems with conversational tones.  Vision Screening Comments: Vision 20/20 with corrected lenses. Macular degeneration. H/O cataract surgery.   Dietary issues and exercise activities discussed: Current Exercise Habits: Home exercise routine, Type of exercise: strength training/weights;stretching;walking, Time (Minutes): 30, Frequency (Times/Week): 3, Weekly Exercise (Minutes/Week): 90, Intensity: Mild, Exercise limited by: orthopedic condition(s);cardiac condition(s)  Patient states she participates in the community based activities offered.  Diet (meal preparation, eat out, water intake, caffeinated beverages, dairy products, fruits and vegetables): Drinks 2 cups coffee (decaf),  water, Decaf unsweet tea with artificial sweetener, wine (16 oz/day). Patient prepares breakfast and lunch, dinner provided at residence (retirement community).   Breakfast: Eggs, toast, milk Lunch: apple, peanut pretzels Dinner: salads, meat/protein, vegetables, fruit  Encouraged patient to increase water and decrease wine intake.     Goals    . Weight (lb) < 178 lb (80.7 kg)          Patient would like to lose 10% of body weight by increasing group exercise with community and increasing water intake.       Depression Screen PHQ 2/9 Scores 05/24/2016 08/25/2015  PHQ - 2 Score 0 0  Exception Documentation Patient refusal -    Fall Risk Fall Risk  05/24/2016 08/25/2015  Falls in the past year? No Yes  Number falls in past yr: - 1  Injury with Fall? - Yes  Risk Factor Category  - High Fall Risk  Risk for fall due to : History of fall(s) Impaired mobility  Follow up - Education provided;Falls prevention discussed;Falls evaluation completed    Cognitive Function: MMSE - Mini Mental State Exam 05/24/2016  Orientation to time 5  Orientation to Place 5  Registration 3  Attention/ Calculation 5  Recall 3  Language- name 2 objects 2  Language- repeat 1  Language- follow 3 step command 3  Language- read & follow direction 1  Write a sentence 1  Copy design 1  Total score 30        Screening Tests Health Maintenance  Topic Date Due  . ZOSTAVAX  04/28/1996  . MAMMOGRAM  04/26/2017  . TETANUS/TDAP  07/22/2022  . INFLUENZA VACCINE  Completed  . DEXA SCAN  Completed  . PNA vac Low Risk Adult  Completed      Plan:     Eat heart healthy diet (full of fruits, vegetables, whole grains, lean protein, water--limit salt, fat, and sugar intake) and increase physical activity as tolerated.  Continue doing brain stimulating activities (puzzles, reading, adult coloring books, staying active) to keep memory sharp.   Bring a copy of your advanced directives to your next office  visit.  Patient scheduled for follow up/CPE with PCP in January 2018.   During the course of the visit, Brittany [name] was educated and counseled about the following appropriate screening and preventive services:   Vaccines to include Pneumoccal, Influenza, Hepatitis B, Td, Zostavax, HCV  Cardiovascular disease screening  Colorectal cancer screening  Bone density screening  Diabetes screening  Glaucoma screening  Mammography/PAP  Nutrition counseling   Patient Instructions (the written plan) were given to the patient.    Gerilyn Nestle, RN   05/24/2016    Reviewed  and agree with above visit and plan.  Electronically Signed by: Howard Pouch, DO [facility] [place]

## 2016-05-24 NOTE — Patient Instructions (Addendum)
Eat heart healthy diet (full of fruits, vegetables, whole grains, lean protein, water--limit salt, fat, and sugar intake) and increase physical activity as tolerated.  Continue doing brain stimulating activities (puzzles, reading, adult coloring books, staying active) to keep memory sharp.   Bring a copy of your advanced directives to your next office visit.  Fall Prevention in the Home  Falls can cause injuries. They can happen to people of all ages. There are many things you can do to make your home safe and to help prevent falls.  WHAT CAN I DO ON THE OUTSIDE OF MY HOME?  Regularly fix the edges of walkways and driveways and fix any cracks.  Remove anything that might make you trip as you walk through a door, such as a raised step or threshold.  Trim any bushes or trees on the path to your home.  Use bright outdoor lighting.  Clear any walking paths of anything that might make someone trip, such as rocks or tools.  Regularly check to see if handrails are loose or broken. Make sure that both sides of any steps have handrails.  Any raised decks and porches should have guardrails on the edges.  Have any leaves, snow, or ice cleared regularly.  Use sand or salt on walking paths during winter.  Clean up any spills in your garage right away. This includes oil or grease spills. WHAT CAN I DO IN THE BATHROOM?   Use night lights.  Install grab bars by the toilet and in the tub and shower. Do not use towel bars as grab bars.  Use non-skid mats or decals in the tub or shower.  If you need to sit down in the shower, use a plastic, non-slip stool.  Keep the floor dry. Clean up any water that spills on the floor as soon as it happens.  Remove soap buildup in the tub or shower regularly.  Attach bath mats securely with double-sided non-slip rug tape.  Do not have throw rugs and other things on the floor that can make you trip. WHAT CAN I DO IN THE BEDROOM?  Use night  lights.  Make sure that you have a light by your bed that is easy to reach.  Do not use any sheets or blankets that are too big for your bed. They should not hang down onto the floor.  Have a firm chair that has side arms. You can use this for support while you get dressed.  Do not have throw rugs and other things on the floor that can make you trip. WHAT CAN I DO IN THE KITCHEN?  Clean up any spills right away.  Avoid walking on wet floors.  Keep items that you use a lot in easy-to-reach places.  If you need to reach something above you, use a strong step stool that has a grab bar.  Keep electrical cords out of the way.  Do not use floor polish or wax that makes floors slippery. If you must use wax, use non-skid floor wax.  Do not have throw rugs and other things on the floor that can make you trip. WHAT CAN I DO WITH MY STAIRS?  Do not leave any items on the stairs.  Make sure that there are handrails on both sides of the stairs and use them. Fix handrails that are broken or loose. Make sure that handrails are as long as the stairways.  Check any carpeting to make sure that it is firmly attached to the stairs.  Fix any carpet that is loose or worn.  Avoid having throw rugs at the top or bottom of the stairs. If you do have throw rugs, attach them to the floor with carpet tape.  Make sure that you have a light switch at the top of the stairs and the bottom of the stairs. If you do not have them, ask someone to add them for you. WHAT ELSE CAN I DO TO HELP PREVENT FALLS?  Wear shoes that:  Do not have high heels.  Have rubber bottoms.  Are comfortable and fit you well.  Are closed at the toe. Do not wear sandals.  If you use a stepladder:  Make sure that it is fully opened. Do not climb a closed stepladder.  Make sure that both sides of the stepladder are locked into place.  Ask someone to hold it for you, if possible.  Clearly mark and make sure that you can  see:  Any grab bars or handrails.  First and last steps.  Where the edge of each step is.  Use tools that help you move around (mobility aids) if they are needed. These include:  Canes.  Walkers.  Scooters.  Crutches.  Turn on the lights when you go into a dark area. Replace any light bulbs as soon as they burn out.  Set up your furniture so you have a clear path. Avoid moving your furniture around.  If any of your floors are uneven, fix them.  If there are any pets around you, be aware of where they are.  Review your medicines with your doctor. Some medicines can make you feel dizzy. This can increase your chance of falling. Ask your doctor what other things that you can do to help prevent falls.   This information is not intended to replace advice given to you by your health care provider. Make sure you discuss any questions you have with your health care provider.   Document Released: 05/04/2009 Document Revised: 11/22/2014 Document Reviewed: 08/12/2014 Elsevier Interactive Patient Education 2016 [employer].  Health Maintenance, Female Adopting a healthy lifestyle and getting preventive care can go a long way to promote health and wellness. Talk with your health care provider about what schedule of regular examinations is right for you. This is a good chance for you to check in with your provider about disease prevention and staying healthy. In between checkups, there are plenty of things you can do on your own. Experts have done a lot of research about which lifestyle changes and preventive measures are most likely to keep you healthy. Ask your health care provider for more information. WEIGHT AND DIET  Eat a healthy diet  Be sure to include plenty of vegetables, fruits, low-fat dairy products, and lean protein.  Do not eat a lot of foods high in solid fats, added sugars, or salt.  Get regular exercise. This is one of the most important things you can do for your  health.  Most adults should exercise for at least 150 minutes each week. The exercise should increase your heart rate and make you sweat (moderate-intensity exercise).  Most adults should also do strengthening exercises at least twice a week. This is in addition to the moderate-intensity exercise.  Maintain a healthy weight  Body mass index (BMI) is a measurement that can be used to identify possible weight problems. It estimates body fat based on height and weight. Your health care provider can help determine your BMI and help you achieve  or maintain a healthy weight.  For females 41 years of age and older:   A BMI below 18.5 is considered underweight.  A BMI of 18.5 to 24.9 is normal.  A BMI of 25 to 29.9 is considered overweight.  A BMI of 30 and above is considered obese.  Watch levels of cholesterol and blood lipids  You should start having your blood tested for lipids and cholesterol at 80 years of age, then have this test every 5 years.  You may need to have your cholesterol levels checked more often if:  Your lipid or cholesterol levels are high.  You are older than 80 years of age.  You are at high risk for heart disease.  CANCER SCREENING   Lung Cancer  Lung cancer screening is recommended for adults 38-2 years old who are at high risk for lung cancer because of a history of smoking.  A yearly low-dose CT scan of the lungs is recommended for people who:  Currently smoke.  Have quit within the past 15 years.  Have at least a 30-pack-year history of smoking. A pack year is smoking an average of one pack of cigarettes a day for 1 year.  Yearly screening should continue until it has been 15 years since you quit.  Yearly screening should stop if you develop a health problem that would prevent you from having lung cancer treatment.  Breast Cancer  Practice breast self-awareness. This means understanding how your breasts normally appear and feel.  It also  means doing regular breast self-exams. Let your health care provider know about any changes, no matter how small.  If you are in your 20s or 30s, you should have a clinical breast exam (CBE) by a health care provider every 1-3 years as part of a regular health exam.  If you are 92 or older, have a CBE every year. Also consider having a breast X-ray (mammogram) every year.  If you have a family history of breast cancer, talk to your health care provider about genetic screening.  If you are at high risk for breast cancer, talk to your health care provider about having an MRI and a mammogram every year.  Breast cancer gene (BRCA) assessment is recommended for women who have family members with BRCA-related cancers. BRCA-related cancers include:  Breast.  Ovarian.  Tubal.  Peritoneal cancers.  Results of the assessment will determine the need for genetic counseling and BRCA1 and BRCA2 testing. Cervical Cancer Your health care provider may recommend that you be screened regularly for cancer of the pelvic organs (ovaries, uterus, and vagina). This screening involves a pelvic examination, including checking for microscopic changes to the surface of your cervix (Pap test). You may be encouraged to have this screening done every 3 years, beginning at age 29.  For women ages 66-65, health care providers may recommend pelvic exams and Pap testing every 3 years, or they may recommend the Pap and pelvic exam, combined with testing for human papilloma virus (HPV), every 5 years. Some types of HPV increase your risk of cervical cancer. Testing for HPV may also be done on women of any age with unclear Pap test results.  Other health care providers may not recommend any screening for nonpregnant women who are considered low risk for pelvic cancer and who do not have symptoms. Ask your health care provider if a screening pelvic exam is right for you.  If you have had past treatment for cervical cancer or a  condition  that could lead to cancer, you need Pap tests and screening for cancer for at least 20 years after your treatment. If Pap tests have been discontinued, your risk factors (such as having a new sexual partner) need to be reassessed to determine if screening should resume. Some women have medical problems that increase the chance of getting cervical cancer. In these cases, your health care provider may recommend more frequent screening and Pap tests. Colorectal Cancer  This type of cancer can be detected and often prevented.  Routine colorectal cancer screening usually begins at 80 years of age and continues through 80 years of age.  Your health care provider may recommend screening at an earlier age if you have risk factors for colon cancer.  Your health care provider may also recommend using home test kits to check for hidden blood in the stool.  A small camera at the end of a tube can be used to examine your colon directly (sigmoidoscopy or colonoscopy). This is done to check for the earliest forms of colorectal cancer.  Routine screening usually begins at age 79.  Direct examination of the colon should be repeated every 5-10 years through 80 years of age. However, you may need to be screened more often if early forms of precancerous polyps or small growths are found. Skin Cancer  Check your skin from head to toe regularly.  Tell your health care provider about any new moles or changes in moles, especially if there is a change in a mole's shape or color.  Also tell your health care provider if you have a mole that is larger than the size of a pencil eraser.  Always use sunscreen. Apply sunscreen liberally and repeatedly throughout the day.  Protect yourself by wearing long sleeves, pants, a wide-brimmed hat, and sunglasses whenever you are outside. HEART DISEASE, DIABETES, AND HIGH BLOOD PRESSURE   High blood pressure causes heart disease and increases the risk of stroke. High  blood pressure is more likely to develop in:  People who have blood pressure in the high end of the normal range (130-139/85-89 mm Hg).  People who are overweight or obese.  People who are African American.  If you are 38-53 years of age, have your blood pressure checked every 3-5 years. If you are 45 years of age or older, have your blood pressure checked every year. You should have your blood pressure measured twice--once when you are at a hospital or clinic, and once when you are not at a hospital or clinic. Record the average of the two measurements. To check your blood pressure when you are not at a hospital or clinic, you can use:  An automated blood pressure machine at a pharmacy.  A home blood pressure monitor.  If you are between 38 years and 78 years old, ask your health care provider if you should take aspirin to prevent strokes.  Have regular diabetes screenings. This involves taking a blood sample to check your fasting blood sugar level.  If you are at a normal weight and have a low risk for diabetes, have this test once every three years after 80 years of age.  If you are overweight and have a high risk for diabetes, consider being tested at a younger age or more often. PREVENTING INFECTION  Hepatitis B  If you have a higher risk for hepatitis B, you should be screened for this virus. You are considered at high risk for hepatitis B if:  You were born in  a country where hepatitis B is common. Ask your health care provider which countries are considered high risk.  Your parents were born in a high-risk country, and you have not been immunized against hepatitis B (hepatitis B vaccine).  You have HIV or AIDS.  You use needles to inject street drugs.  You live with someone who has hepatitis B.  You have had sex with someone who has hepatitis B.  You get hemodialysis treatment.  You take certain medicines for conditions, including cancer, organ transplantation, and  autoimmune conditions. Hepatitis C  Blood testing is recommended for:  Everyone born from [birth year] through 1963.  Anyone with known risk factors for hepatitis C. Sexually transmitted infections (STIs)  You should be screened for sexually transmitted infections (STIs) including gonorrhea and chlamydia if:  You are sexually active and are younger than 80 years of age.  You are older than 80 years of age and your health care provider tells you that you are at risk for this type of infection.  Your sexual activity has changed since you were last screened and you are at an increased risk for chlamydia or gonorrhea. Ask your health care provider if you are at risk.  If you do not have HIV, but are at risk, it may be recommended that you take a prescription medicine daily to prevent HIV infection. This is called pre-exposure prophylaxis (PrEP). You are considered at risk if:  You are sexually active and do not regularly use condoms or know the HIV status of your partner(s).  You take drugs by injection.  You are sexually active with a partner who has HIV. Talk with your health care provider about whether you are at high risk of being infected with HIV. If you choose to begin PrEP, you should first be tested for HIV. You should then be tested every 3 months for as long as you are taking PrEP.  PREGNANCY   If you are premenopausal and you may become pregnant, ask your health care provider about preconception counseling.  If you may become pregnant, take 400 to 800 micrograms (mcg) of folic acid every day.  If you want to prevent pregnancy, talk to your health care provider about birth control (contraception). OSTEOPOROSIS AND MENOPAUSE   Osteoporosis is a disease in which the bones lose minerals and strength with aging. This can result in serious bone fractures. Your risk for osteoporosis can be identified using a bone density scan.  If you are 76 years of age or older, or if you are at risk  for osteoporosis and fractures, ask your health care provider if you should be screened.  Ask your health care provider whether you should take a calcium or vitamin D supplement to lower your risk for osteoporosis.  Menopause may have certain physical symptoms and risks.  Hormone replacement therapy may reduce some of these symptoms and risks. Talk to your health care provider about whether hormone replacement therapy is right for you.  HOME CARE INSTRUCTIONS   Schedule regular health, dental, and eye exams.  Stay current with your immunizations.   Do not use any tobacco products including cigarettes, chewing tobacco, or electronic cigarettes.  If you are pregnant, do not drink alcohol.  If you are breastfeeding, limit how much and how often you drink alcohol.  Limit alcohol intake to no more than 1 drink per day for nonpregnant women. One drink equals 12 ounces of beer, 5 ounces of wine, or 1 ounces of hard liquor.  Do not use street drugs.  Do not share needles.  Ask your health care provider for help if you need support or information about quitting drugs.  Tell your health care provider if you often feel depressed.  Tell your health care provider if you have ever been abused or do not feel safe at home.   This information is not intended to replace advice given to you by your health care provider. Make sure you discuss any questions you have with your health care provider.   Document Released: 01/21/2011 Document Revised: 07/29/2014 Document Reviewed: 06/09/2013 Elsevier Interactive Patient Education [employer].

## 2016-06-05 DIAGNOSIS — H1033 Unspecified acute conjunctivitis, bilateral: Secondary | ICD-10-CM | POA: Diagnosis not present

## 2016-06-17 ENCOUNTER — Other Ambulatory Visit: Payer: Self-pay | Admitting: Family Medicine

## 2016-06-27 DIAGNOSIS — Z950 Presence of cardiac pacemaker: Secondary | ICD-10-CM | POA: Diagnosis not present

## 2016-06-27 DIAGNOSIS — Z83518 Family history of other specified eye disorder: Secondary | ICD-10-CM | POA: Diagnosis not present

## 2016-06-27 DIAGNOSIS — H04123 Dry eye syndrome of bilateral lacrimal glands: Secondary | ICD-10-CM | POA: Diagnosis not present

## 2016-06-27 DIAGNOSIS — H353133 Nonexudative age-related macular degeneration, bilateral, advanced atrophic without subfoveal involvement: Secondary | ICD-10-CM | POA: Diagnosis not present

## 2016-06-27 DIAGNOSIS — Z961 Presence of intraocular lens: Secondary | ICD-10-CM | POA: Diagnosis not present

## 2016-06-27 DIAGNOSIS — Z809 Family history of malignant neoplasm, unspecified: Secondary | ICD-10-CM | POA: Diagnosis not present

## 2016-06-27 DIAGNOSIS — Z8679 Personal history of other diseases of the circulatory system: Secondary | ICD-10-CM | POA: Diagnosis not present

## 2016-06-27 DIAGNOSIS — Z83511 Family history of glaucoma: Secondary | ICD-10-CM | POA: Diagnosis not present

## 2016-07-05 DIAGNOSIS — M25572 Pain in left ankle and joints of left foot: Secondary | ICD-10-CM | POA: Diagnosis not present

## 2016-07-05 DIAGNOSIS — M19172 Post-traumatic osteoarthritis, left ankle and foot: Secondary | ICD-10-CM | POA: Diagnosis not present

## 2016-07-19 ENCOUNTER — Other Ambulatory Visit: Payer: Self-pay | Admitting: Family Medicine

## 2016-07-19 DIAGNOSIS — I495 Sick sinus syndrome: Secondary | ICD-10-CM

## 2016-07-22 DIAGNOSIS — S62109A Fracture of unspecified carpal bone, unspecified wrist, initial encounter for closed fracture: Secondary | ICD-10-CM

## 2016-07-22 HISTORY — DX: Fracture of unspecified carpal bone, unspecified wrist, initial encounter for closed fracture: S62.109A

## 2016-08-06 ENCOUNTER — Encounter: Payer: Medicare Other | Admitting: Family Medicine

## 2016-08-14 ENCOUNTER — Encounter: Payer: Self-pay | Admitting: Family Medicine

## 2016-08-14 ENCOUNTER — Ambulatory Visit (INDEPENDENT_AMBULATORY_CARE_PROVIDER_SITE_OTHER): Payer: Medicare Other | Admitting: Family Medicine

## 2016-08-14 VITALS — BP 130/81 | HR 69 | Temp 98.0°F | Resp 20 | Ht 68.0 in | Wt 197.8 lb

## 2016-08-14 DIAGNOSIS — G4733 Obstructive sleep apnea (adult) (pediatric): Secondary | ICD-10-CM

## 2016-08-14 DIAGNOSIS — I10 Essential (primary) hypertension: Secondary | ICD-10-CM | POA: Diagnosis not present

## 2016-08-14 DIAGNOSIS — I495 Sick sinus syndrome: Secondary | ICD-10-CM | POA: Diagnosis not present

## 2016-08-14 DIAGNOSIS — F329 Major depressive disorder, single episode, unspecified: Secondary | ICD-10-CM

## 2016-08-14 DIAGNOSIS — M8580 Other specified disorders of bone density and structure, unspecified site: Secondary | ICD-10-CM

## 2016-08-14 DIAGNOSIS — F418 Other specified anxiety disorders: Secondary | ICD-10-CM

## 2016-08-14 DIAGNOSIS — E785 Hyperlipidemia, unspecified: Secondary | ICD-10-CM

## 2016-08-14 DIAGNOSIS — Z950 Presence of cardiac pacemaker: Secondary | ICD-10-CM

## 2016-08-14 DIAGNOSIS — I2510 Atherosclerotic heart disease of native coronary artery without angina pectoris: Secondary | ICD-10-CM | POA: Diagnosis not present

## 2016-08-14 DIAGNOSIS — F419 Anxiety disorder, unspecified: Secondary | ICD-10-CM

## 2016-08-14 DIAGNOSIS — E559 Vitamin D deficiency, unspecified: Secondary | ICD-10-CM | POA: Insufficient documentation

## 2016-08-14 DIAGNOSIS — Z9989 Dependence on other enabling machines and devices: Secondary | ICD-10-CM

## 2016-08-14 DIAGNOSIS — M810 Age-related osteoporosis without current pathological fracture: Secondary | ICD-10-CM | POA: Insufficient documentation

## 2016-08-14 DIAGNOSIS — F32A Depression, unspecified: Secondary | ICD-10-CM

## 2016-08-14 DIAGNOSIS — I480 Paroxysmal atrial fibrillation: Secondary | ICD-10-CM | POA: Diagnosis not present

## 2016-08-14 LAB — COMPLETE METABOLIC PANEL WITH GFR
ALT: 14 U/L (ref 6–29)
AST: 19 U/L (ref 10–35)
Albumin: 3.9 g/dL (ref 3.6–5.1)
Alkaline Phosphatase: 96 U/L (ref 33–130)
BUN: 11 mg/dL (ref 7–25)
CO2: 28 mmol/L (ref 20–31)
Calcium: 10.5 mg/dL — ABNORMAL HIGH (ref 8.6–10.4)
Chloride: 101 mmol/L (ref 98–110)
Creat: 0.59 mg/dL — ABNORMAL LOW (ref 0.60–0.88)
GFR, Est African American: >89 mL/min (ref >=60)
GFR, Est Non African American: 87 mL/min (ref >=60)
Glucose, Bld: 103 mg/dL — ABNORMAL HIGH (ref 65–99)
Potassium: 4.5 mmol/L (ref 3.5–5.3)
Sodium: 136 mmol/L (ref 135–146)
Total Bilirubin: 0.6 mg/dL (ref 0.2–1.2)
Total Protein: 7.2 g/dL (ref 6.1–8.1)

## 2016-08-14 LAB — CBC WITH DIFFERENTIAL/PLATELET
Basophils Absolute: 0 10*3/uL (ref 0.0–0.1)
Basophils Relative: 0.3 % (ref 0.0–3.0)
Eosinophils Absolute: 0.2 10*3/uL (ref 0.0–0.7)
Eosinophils Relative: 3.1 % (ref 0.0–5.0)
HCT: 43.2 % (ref 36.0–46.0)
Hemoglobin: 14.7 g/dL (ref 12.0–15.0)
Lymphocytes Relative: 17.1 % (ref 12.0–46.0)
Lymphs Abs: 1.2 10*3/uL (ref 0.7–4.0)
MCHC: 34.1 g/dL (ref 30.0–36.0)
MCV: 93 fl (ref 78.0–100.0)
Monocytes Absolute: 0.7 10*3/uL (ref 0.1–1.0)
Monocytes Relative: 10.8 % (ref 3.0–12.0)
Neutro Abs: 4.7 10*3/uL (ref 1.4–7.7)
Neutrophils Relative %: 68.7 % (ref 43.0–77.0)
Platelets: 354 10*3/uL (ref 150.0–400.0)
RBC: 4.65 Mil/uL (ref 3.87–5.11)
RDW: 14.1 % (ref 11.5–15.5)
WBC: 6.9 10*3/uL (ref 4.0–10.5)

## 2016-08-14 LAB — TSH: TSH: 2.59 u[IU]/mL (ref 0.35–4.50)

## 2016-08-14 LAB — LIPID PANEL
Cholesterol: 252 mg/dL — ABNORMAL HIGH (ref 0–200)
HDL: 72.6 mg/dL (ref >39.00)
LDL Cholesterol: 158 mg/dL — ABNORMAL HIGH (ref 0–99)
NonHDL: 179.12
Total CHOL/HDL Ratio: 3
Triglycerides: 107 mg/dL (ref 0.0–149.0)
VLDL: 21.4 mg/dL (ref 0.0–40.0)

## 2016-08-14 MED ORDER — CITALOPRAM HYDROBROMIDE 20 MG PO TABS: ORAL_TABLET | ORAL | 1 refills | Status: DC

## 2016-08-14 MED ORDER — LISINOPRIL 40 MG PO TABS: 40.0000 mg | ORAL_TABLET | Freq: Every day | ORAL | 1 refills | Status: DC

## 2016-08-14 MED ORDER — AMLODIPINE BESYLATE 10 MG PO TABS: 10.0000 mg | ORAL_TABLET | Freq: Every day | ORAL | 1 refills | Status: DC

## 2016-08-14 NOTE — Progress Notes (Signed)
Patient ID: Brittany [name], female   DOB: 04-Oct-1933, 83 y.o.   MRN: [id number]   Subjective:    Patient ID: Brittany [name], female    DOB: [birth year], 83 y.o.   MRN: [id number]  HPI  Hypertension/CAD/A.FIB/Sinus node dysfunctionPacemaker-Medtronic/Hyperlipidemia: Patient reports compliance with current dictation regimen sotalol 120 mg twice a day, lisinopril 40 mg daily, amlodipine 10 mg daily. He denies any chest pain, shortness of breath, dizziness or lower extremity edema. She continues to follow with cardiology for her arrhythmia. She monitors her diet closely, and exercises.  Vit d deficiency/osteopenia: Patient has been vitamin D deficient, she continues to take supplemental vitamin D 5000 units daily. Last checked February 2016 vitamin D was 26.4. She has a history of osteopenia, will be due for repeat DEXA next year.  OSA on CPAP: Patient reports compliance with Z-Pak use and follow-up with pulmonology. She denies any daytime somnolence.  Anxiety and depression: She reports compliance with citalopram 20 mg daily. She states she feels good on this medication. She denies any negative side effects of this medication, and ask for refills today.    Past Medical History:  Diagnosis Date  . Atrial fibrillation ([place])   . Depression   . Elevated parathyroid hormone   . Fracture of orbital floor with routine healing   . GERD (gastroesophageal reflux disease)   . History of colon polyps    benign  . Hypercalcemia   . Hypertension   . Hyponatremia   . Hypothyroid   . Macular degeneration    wet in the right and dry in the left  . OSA on CPAP   . Osteoporosis   . Peripheral vascular disease ([place])   . PSVT (paroxysmal supraventricular tachycardia) ([place])   . Rotator cuff tear   . Sinus node dysfunction ([facility])    a. s/p MDT pacemaker  . Urinary incontinence    Allergies  Allergen Reactions  . Adhesive [Tape] Swelling and Rash  . Benzalkonium Chloride Rash  . Neosporin  [Neomycin-Polymyxin-Gramicidin] Swelling and Rash   Allergies as of 08/14/2016      Reactions   Adhesive [tape] Swelling, Rash   Benzalkonium Chloride Rash   Neosporin [neomycin-polymyxin-gramicidin] Swelling, Rash      Medication List       Accurate as of 08/14/16  9:05 AM. Always use your most recent med list.          amLODipine 10 MG tablet Commonly known as:  NORVASC TAKE 1 TABLET (10 MG TOTAL) BY MOUTH DAILY.   aspirin EC 81 MG tablet Take 1 tablet (81 mg total) by mouth daily.   B-complex with vitamin C tablet Take 1 tablet by mouth daily.   citalopram 20 MG tablet Commonly known as:  CELEXA TAKE 1 TABLET (20 MG TOTAL) BY MOUTH DAILY.   desmopressin 0.2 MG tablet Commonly known as:  DDAVP Take 1 tablet (0.2 mg total) by mouth daily.   lisinopril 40 MG tablet Commonly known as:  PRINIVIL,ZESTRIL TAKE 1 TABLET (40 MG TOTAL) BY MOUTH DAILY.   magnesium oxide 400 MG tablet Commonly known as:  MAG-OX Take 1 tablet (400 mg total) by mouth daily.   multivitamin with minerals Tabs tablet Take 1 tablet by mouth daily.   omeprazole 20 MG capsule Commonly known as:  PRILOSEC Take 1 capsule (20 mg total) by mouth daily.   PRESERVISION AREDS PO Take 1 tablet by mouth 2 (two) times daily.   solifenacin 10 MG tablet Commonly known as:  VESICARE Take 1  tablet (10 mg total) by mouth daily.   sotalol 80 MG tablet Commonly known as:  BETAPACE TAKE 1 AND 1/2 TABLETS BYMOUTH TWICE A DAY   Vitamin D3 5000 units Caps Take 1 Can by mouth daily.       Review of Systems Negative, with the exception of above mentioned in HPI     Objective:   Physical Exam BP 130/81 (BP Location: Right Arm, Patient Position: Sitting, Cuff Size: Normal)   Pulse 69   Temp 98 F (36.7 C)   Resp 20   Ht 5\' 8"  (1.727 m)   Wt 197 lb 12 oz (89.7 kg)   LMP 07/22/1968   SpO2 97%   BMI 30.07 kg/m  Gen: Afebrile. No acute distress. Pleasant female.  HENT: AT. [place].  MMM.  Eyes:Pupils  Equal Round Reactive to light, Extraocular movements intact,  Conjunctiva without redness, discharge or icterus. Neck/lymp/endocrine: Supple,no lymphadenopathy, no thyromegaly CV: RRR no murmur, noedema, +2/4 P posterior tibialis pulses Chest: CTAB, no wheeze or crackles Abd: Soft. NTND. BS present. no Masses palpated.  Skin: no  rashes, purpura or petechiae. WWW. Neuro:  Normal gait. PERLA. EOMi. Alert. Oriented x3 Psych: Normal affect, dress and demeanor. Normal speech. Normal thought content and judgment.    Assessment & Plan:  Brittany [name] is a 83 y.o. female present for follow up chronic medical conditions.   Essential hypertension, benign/Sinus node dysfunction/atrial fibrillation/hyperlipidemia - Refill on Lisinopril, Norvasc - Patient now taking lisinopril 40 mg and Norvasc 10 mg daily.  - Continue Betapace for atrial fibrillation/sinus node dysfunction - Blood pressure is well-controlled today . - Continue lisinopril 40 mg and Norvasc 10 mg, refills placed today if needed.  - Follow-up in 6 months for hypertension, or Medicare wellness (whichever is due at that time).  - Continue baby ASA - Lipid panel, elevated in the past, not on medications.  - COMPLETE METABOLIC PANEL WITH GFR - TSH - CBC w/Diff  OSA on CPAP - follows routinely with Dr. Halford [facility]mon[facility] - compliant with CPAP  Vitamin D deficiency/Osteopenia - recheck level today, last level low. She is supplementing.  - Osteopenia, last DEXA 2014, will repeat on next [place] - VITAMIN D 25 Hydroxy (Vit-D Deficiency, Fractures); Future  Anxiety and depression Doing well on Celexa. Refill provided today for 6 months.   F/U 6 months chronic conditions F/U yearly for [place]  Electronically Signed by: Howard Pouch, DO [facility] [place]

## 2016-08-14 NOTE — Patient Instructions (Signed)
Your BP looks great.  We will call when your labs return.  Follow up for medicare wellness when due (yearly) And every 6 months for chronic medical issues (hypertension, anxiety etc)  It was great to see you Today.  Stay warm    Please help [place] help you:  It is a privilege to be able to take care of great patients such as yourself. We are honored you have chosen [facility] for your Primary Care home. Below you will find basic instructions that you may need to access in the future. Please help [place] help you by reading the instructions, which cover many of the frequent questions we experience.   Prescription refills and request:  -In order to allow more efficient response time, please call your pharmacy for all refills. They will forward the request electronically to [place]. This allows for the quickest possible response. Request left on a nurse line can take longer to refill, since these are checked as time allows between office patients and other phone calls.  - refill request can take up to 3-5 working days to complete.  - If request is sent electronically and request is appropiate, it is usually completed in 1-2 business days.  - all patients will need to be seen routinely for all chronic medical conditions requiring prescription medications (see follow-up below). If you are overdue for follow up on your condition, you will be asked to make an appointment and we will call in enough medication to cover you until your appointment (up to 30 days).  - all controlled substances will require a face to face visit to request/refill.  - if you desire your prescriptions to go through a new pharmacy, and have an active script at original pharmacy, you will need to call your pharmacy and have scripts transferred to new pharmacy. This is completed between the pharmacy locations and not by your provider.    Results: If any images or labs were ordered, it can take up to 1 week to get results depending on the  test ordered and the lab/facility running and resulting the test. - Normal or stable results, which do not need further discussion, will be released to your mychart immediately with attached note to you. A call will not be generated for normal results. Please make certain to sign up for mychart. If you have questions on how to activate your mychart you can call the front office.  - If your results need further discussion, our office will attempt to contact you via phone, and if unable to reach you after 2 attempts, we will release your abnormal result to your mychart with instructions.  - All results will be automatically released in mychart after 1 week.  - Your provider will provide you with explanation and instruction on all relevant material in your results. Please keep in mind, results and labs may appear confusing or abnormal to the untrained eye, but it does not mean they are actually abnormal for you personally. If you have any questions about your results that are not covered, or you desire more detailed explanation than what was provided, you should make an appointment with your provider to do so.   Our office handles many outgoing and incoming calls daily. If we have not contacted you within 1 week about your results, please check your mychart to see if there is a message first and if not, then contact our office.  In helping with this matter, you help decrease call volume, and  therefore allow [place] to be able to respond to patients needs more efficiently.   Acute office visits (sick visit):  An acute visit is intended for a new problem and are scheduled in shorter time slots to allow schedule openings for patients with new problems. This is the appropriate visit to discuss a new problem. In order to provide you with excellent quality medical care with proper time for you to explain your problem, have an exam and receive treatment with instructions, these appointments should be limited to one new  problem per visit. If you experience a new problem, in which you desire to be addressed, please make an acute office visit, we save openings on the schedule to accommodate you. Please do not save your new problem for any other type of visit, let us take care of it properly and quickly for you.   Follow up visits:  Depending on your condition(s) your provider will need to see you routinely in order to provide you with quality care and prescribe medication(s). Most chronic conditions (Example: hypertension, Diabetes, depression/anxiety... etc), require visits a couple times a year. Your provider will instruct you on proper follow up for your personal medical conditions and history. Please make certain to make follow up appointments for your condition as instructed. Failing to do so could result in lapse in your medication treatment/refills. If you request a refill, and are overdue to be seen on a condition, we will always provide you with a 30 day script (once) to allow you time to schedule.    Medicare wellness (well visit): - we have a wonderful Nurse Maudie Mercury), that will meet with you and provide you will yearly medicare wellness visits. These visits should occur yearly (can not be scheduled less than 1 calendar year apart) and cover preventive health, immunizations, advance directives and screenings you are entitled to yearly through your medicare benefits. Do not miss out on your entitled benefits, this is when medicare will pay for these benefits to be ordered for you.  These are strongly encouraged by your provider and is the appropriate type of visit to make certain you are up to date with all preventive health benefits. If you have not had your medicare wellness exam in the last 12 months, please make certain to schedule one by calling the office and schedule your medicare wellness with Maudie Mercury as soon as possible.   Yearly physical (well visit):  - Adults are recommended to be seen yearly for physicals.  Check with your insurance and date of your last physical, most insurances require one calendar year between physicals. Physicals include all preventive health topics, screenings, medical exam and labs that are appropriate for gender/age and history. You may have fasting labs needed at this visit. This is a well visit (not a sick visit), acute topics should not be covered during this visit.  - Pediatric patients are seen more frequently when they are younger. Your provider will advise you on well child visit timing that is appropriate for your their age. - This is not a medicare wellness visit. Medicare wellness exams do not have an exam portion to the visit. Some medicare companies allow for a physical, some do not allow a yearly physical. If your medicare allows a yearly physical you can schedule the medicare wellness with our nurse Maudie Mercury and have your physical with your provider after, on the same day. Please check with insurance for your full benefits.   Late Policy/No Shows:  - all new patients should  arrive 15-30 minutes earlier than appointment to allow [place] time  to  obtain all personal demographics,  insurance information and for you to complete office paperwork. - All established patients should arrive 10-15 minutes earlier than appointment time to update all information and be checked in .  - In our best efforts to run on time, if you are late for your appointment you will be asked to either reschedule or if able, we will work you back into the schedule. There will be a wait time to work you back in the schedule,  depending on availability.  - If you are unable to make it to your appointment as scheduled, please call 24 hours ahead of time to allow [place] to fill the time slot with someone else who needs to be seen. If you do not cancel your appointment ahead of time, you may be charged a no show fee.

## 2016-08-16 ENCOUNTER — Encounter: Payer: Self-pay | Admitting: Family Medicine

## 2016-08-16 ENCOUNTER — Other Ambulatory Visit (INDEPENDENT_AMBULATORY_CARE_PROVIDER_SITE_OTHER): Payer: Medicare Other

## 2016-08-16 ENCOUNTER — Telehealth: Payer: Self-pay | Admitting: Family Medicine

## 2016-08-16 DIAGNOSIS — I495 Sick sinus syndrome: Secondary | ICD-10-CM | POA: Diagnosis not present

## 2016-08-16 DIAGNOSIS — I480 Paroxysmal atrial fibrillation: Secondary | ICD-10-CM

## 2016-08-16 DIAGNOSIS — I10 Essential (primary) hypertension: Secondary | ICD-10-CM

## 2016-08-16 DIAGNOSIS — E559 Vitamin D deficiency, unspecified: Secondary | ICD-10-CM

## 2016-08-16 DIAGNOSIS — G4733 Obstructive sleep apnea (adult) (pediatric): Secondary | ICD-10-CM

## 2016-08-16 DIAGNOSIS — I2510 Atherosclerotic heart disease of native coronary artery without angina pectoris: Secondary | ICD-10-CM | POA: Diagnosis not present

## 2016-08-16 DIAGNOSIS — Z9989 Dependence on other enabling machines and devices: Secondary | ICD-10-CM

## 2016-08-16 LAB — VITAMIN D 25 HYDROXY (VIT D DEFICIENCY, FRACTURES): VITD: 26.27 ng/mL — ABNORMAL LOW (ref 30.00–100.00)

## 2016-08-16 MED ORDER — VITAMIN D (ERGOCALCIFEROL) 1.25 MG (50000 UNIT) PO CAPS: 50000.0000 [IU] | ORAL_CAPSULE | ORAL | 0 refills | Status: DC

## 2016-08-16 NOTE — Telephone Encounter (Signed)
Her vit d was mildly low at 6 Will call in the extra 50000u dosing for just 8 weeks to boost her, she is to continue her daily dose.

## 2016-08-16 NOTE — Telephone Encounter (Addendum)
Please call pt:  her labs look good except her cholesterol is higher than prior. I would encourage her to eat a higher fiber diet, try to increase her exercise and consider starting 1-2 g of fish oil supplement. We will check her cholesterol again in 6-12 months.  - FYI: her vit d is still pending and we will call her if it is abnormal only.  Lipid Panel     Component Value Date/Time   CHOL 252 (H) 08/14/2016 0930   TRIG 107.0 08/14/2016 0930   TRIG 69 10/28/2012 1246   HDL 72.60 08/14/2016 0930   CHOLHDL 3 08/14/2016 0930   VLDL 21.4 08/14/2016 0930   LDLCALC 158 (H) 08/14/2016 0930

## 2016-08-16 NOTE — Telephone Encounter (Signed)
Spoke with patient reviewed lab results and instructions. Patient verbalized understanding. 

## 2016-08-19 NOTE — Telephone Encounter (Signed)
Patient notified and verbalized understanding. 

## 2016-09-10 ENCOUNTER — Ambulatory Visit (INDEPENDENT_AMBULATORY_CARE_PROVIDER_SITE_OTHER): Payer: Medicare Other | Admitting: Pulmonary Disease

## 2016-09-10 ENCOUNTER — Encounter: Payer: Self-pay | Admitting: Pulmonary Disease

## 2016-09-10 VITALS — BP 134/80 | HR 66 | Ht 68.0 in | Wt 200.2 lb

## 2016-09-10 DIAGNOSIS — I2510 Atherosclerotic heart disease of native coronary artery without angina pectoris: Secondary | ICD-10-CM

## 2016-09-10 DIAGNOSIS — G4733 Obstructive sleep apnea (adult) (pediatric): Secondary | ICD-10-CM | POA: Diagnosis not present

## 2016-09-10 DIAGNOSIS — Z9989 Dependence on other enabling machines and devices: Secondary | ICD-10-CM | POA: Diagnosis not present

## 2016-09-10 NOTE — Patient Instructions (Signed)
Follow up in 1 year.

## 2016-09-10 NOTE — Progress Notes (Signed)
Current Outpatient Prescriptions on File Prior to Visit  Medication Sig  . amLODipine (NORVASC) 10 MG tablet Take 1 tablet (10 mg total) by mouth daily.  Marland Kitchen aspirin EC 81 MG tablet Take 1 tablet (81 mg total) by mouth daily.  . B Complex-C (B-COMPLEX WITH VITAMIN C) tablet Take 1 tablet by mouth daily.  . Cholecalciferol (VITAMIN D3) 5000 UNITS CAPS Take 1 Can by mouth daily.  . citalopram (CELEXA) 20 MG tablet TAKE 1 TABLET (20 MG TOTAL) BY MOUTH DAILY.  Marland Kitchen desmopressin (DDAVP) 0.2 MG tablet Take 1 tablet (0.2 mg total) by mouth daily.  Marland Kitchen lisinopril (PRINIVIL,ZESTRIL) 40 MG tablet Take 1 tablet (40 mg total) by mouth daily.  . magnesium oxide (MAG-OX) 400 MG tablet Take 1 tablet (400 mg total) by mouth daily.  . Multiple Vitamin (MULTIVITAMIN WITH MINERALS) TABS tablet Take 1 tablet by mouth daily.  . Multiple Vitamins-Minerals (PRESERVISION AREDS PO) Take 1 tablet by mouth 2 (two) times daily.  Marland Kitchen omeprazole (PRILOSEC) 20 MG capsule Take 1 capsule (20 mg total) by mouth daily.  . sotalol (BETAPACE) 80 MG tablet TAKE 1 AND 1/2 TABLETS BYMOUTH TWICE A DAY  . Vitamin D, Ergocalciferol, (DRISDOL) 50000 units CAPS capsule Take 1 capsule (50,000 Units total) by mouth every 7 (seven) days.   No current facility-administered medications on file prior to visit.      Chief Complaint  Patient presents with  . Follow-up    Wears CPAP nightly. Denies any issues with mask/pressure. Pt has tried different masks since last seen but has gone back to the nasal mask as this was better tolerated. DME: [employer]     Sleep tests PSG 08/13/12 ([place]) >> AHI 24, SaO2 low 72%, CPAP 8 PSG 07/03/14 >> AHI 24.4, SaO2 low 76%, PLMI 81.8. CPAP 8 cm H2O >> AHI 0, +S.  CPAP 08/11/16 to 09/09/16 >> used on 30 of 30 nights with average 9 hrs 2 min.  Average AHI 1.8 with CPAP 8 cm H2O  Past medical history A fib, HTN, Depression, Hypothyroidism, GERD  Past surgical history, Family history, Social history, Allergies  reviewed  Vital Signs BP 134/80 (BP Location: Left Arm, Cuff Size: Normal)   Pulse 66   Ht 5\' 8"  (1.727 m)   Wt 200 lb 3.2 oz (90.8 kg)   LMP 07/22/1968   SpO2 96%   BMI 30.44 kg/m   History of Present Illness Adna Warhurst is a 83 y.o. female with OSA.  She is doing well with CPAP.  No issues with mask fit.  Uses nasal pillows.  Goes to bed at 930 pm.  Wakes up at 630 am.  Wakes up every few hours to use bathroom.  Not napping during the day.  Physical Exam  General - pleasant Eyes - wears glasses ENT - no sinus tenderness, no oral exudate, no LAN Cardiac - regular, no murmur Chest - no wheeze, rales Back - no tenderness Abd - soft, non tender Ext - no edema Neuro - normal strength Skin - no rashes Psych - normal mood   Assessment/Plan  Obstructive sleep apnea. - she is compliant with CPAP and reports benefit from therapy - continue CPAP 8 cm H2O   Patient Instructions  Follow up in 1 year    Chesley Mires, MD [facility] Pager:  [phone]

## 2016-09-20 DIAGNOSIS — M25572 Pain in left ankle and joints of left foot: Secondary | ICD-10-CM | POA: Diagnosis not present

## 2016-10-07 DIAGNOSIS — M1712 Unilateral primary osteoarthritis, left knee: Secondary | ICD-10-CM | POA: Diagnosis not present

## 2016-10-07 DIAGNOSIS — M1711 Unilateral primary osteoarthritis, right knee: Secondary | ICD-10-CM | POA: Diagnosis not present

## 2016-10-28 DIAGNOSIS — M1711 Unilateral primary osteoarthritis, right knee: Secondary | ICD-10-CM | POA: Diagnosis not present

## 2016-10-28 DIAGNOSIS — M1712 Unilateral primary osteoarthritis, left knee: Secondary | ICD-10-CM | POA: Diagnosis not present

## 2016-10-31 DIAGNOSIS — M791 Myalgia: Secondary | ICD-10-CM | POA: Diagnosis not present

## 2016-10-31 DIAGNOSIS — M545 Low back pain: Secondary | ICD-10-CM | POA: Diagnosis not present

## 2016-10-31 DIAGNOSIS — M47816 Spondylosis without myelopathy or radiculopathy, lumbar region: Secondary | ICD-10-CM | POA: Diagnosis not present

## 2016-10-31 DIAGNOSIS — M4807 Spinal stenosis, lumbosacral region: Secondary | ICD-10-CM | POA: Diagnosis not present

## 2016-11-04 DIAGNOSIS — M1712 Unilateral primary osteoarthritis, left knee: Secondary | ICD-10-CM | POA: Diagnosis not present

## 2016-11-04 DIAGNOSIS — M1711 Unilateral primary osteoarthritis, right knee: Secondary | ICD-10-CM | POA: Diagnosis not present

## 2016-11-05 DIAGNOSIS — N3941 Urge incontinence: Secondary | ICD-10-CM | POA: Diagnosis not present

## 2016-11-05 DIAGNOSIS — R350 Frequency of micturition: Secondary | ICD-10-CM | POA: Diagnosis not present

## 2016-11-11 DIAGNOSIS — M1712 Unilateral primary osteoarthritis, left knee: Secondary | ICD-10-CM | POA: Diagnosis not present

## 2016-11-11 DIAGNOSIS — M1711 Unilateral primary osteoarthritis, right knee: Secondary | ICD-10-CM | POA: Diagnosis not present

## 2016-11-12 ENCOUNTER — Encounter: Payer: Self-pay | Admitting: Internal Medicine

## 2016-11-18 DIAGNOSIS — M1711 Unilateral primary osteoarthritis, right knee: Secondary | ICD-10-CM | POA: Diagnosis not present

## 2016-11-18 DIAGNOSIS — M1712 Unilateral primary osteoarthritis, left knee: Secondary | ICD-10-CM | POA: Diagnosis not present

## 2016-11-29 DIAGNOSIS — M1711 Unilateral primary osteoarthritis, right knee: Secondary | ICD-10-CM | POA: Diagnosis not present

## 2016-11-29 DIAGNOSIS — M1712 Unilateral primary osteoarthritis, left knee: Secondary | ICD-10-CM | POA: Diagnosis not present

## 2016-12-02 DIAGNOSIS — M545 Low back pain: Secondary | ICD-10-CM | POA: Diagnosis not present

## 2016-12-02 DIAGNOSIS — M9903 Segmental and somatic dysfunction of lumbar region: Secondary | ICD-10-CM | POA: Diagnosis not present

## 2016-12-02 DIAGNOSIS — M5440 Lumbago with sciatica, unspecified side: Secondary | ICD-10-CM | POA: Diagnosis not present

## 2016-12-02 DIAGNOSIS — M9904 Segmental and somatic dysfunction of sacral region: Secondary | ICD-10-CM | POA: Diagnosis not present

## 2016-12-03 ENCOUNTER — Ambulatory Visit (INDEPENDENT_AMBULATORY_CARE_PROVIDER_SITE_OTHER): Payer: Medicare Other | Admitting: Internal Medicine

## 2016-12-03 ENCOUNTER — Encounter: Payer: Self-pay | Admitting: Internal Medicine

## 2016-12-03 VITALS — BP 146/80 | HR 76 | Ht 68.0 in | Wt 190.0 lb

## 2016-12-03 DIAGNOSIS — Z950 Presence of cardiac pacemaker: Secondary | ICD-10-CM

## 2016-12-03 DIAGNOSIS — I480 Paroxysmal atrial fibrillation: Secondary | ICD-10-CM | POA: Diagnosis not present

## 2016-12-03 DIAGNOSIS — M25551 Pain in right hip: Secondary | ICD-10-CM | POA: Diagnosis not present

## 2016-12-03 DIAGNOSIS — Z79899 Other long term (current) drug therapy: Secondary | ICD-10-CM | POA: Diagnosis not present

## 2016-12-03 DIAGNOSIS — I2510 Atherosclerotic heart disease of native coronary artery without angina pectoris: Secondary | ICD-10-CM

## 2016-12-03 DIAGNOSIS — R001 Bradycardia, unspecified: Secondary | ICD-10-CM | POA: Diagnosis not present

## 2016-12-03 MED ORDER — SOTALOL HCL 80 MG PO TABS: ORAL_TABLET | ORAL | 3 refills | Status: DC

## 2016-12-03 NOTE — Patient Instructions (Addendum)
Medication Instructions: - Your physician recommends that you continue on your current medications as directed. Please refer to the Current Medication list given to you today.  Labwork: - Your physician recommends that you have lab work today: BMP/Magnesium  Procedures/Testing: - none ordered  Follow-Up: - Remote monitoring is used to monitor your Pacemaker of ICD from home. This monitoring reduces the number of office visits required to check your device to one time per year. It allows [place] to keep an eye on the functioning of your device to ensure it is working properly. You are scheduled for a device check from home on 03/04/17. You may send your transmission at any time that day. If you have a wireless device, the transmission will be sent automatically. After your physician reviews your transmission, you will receive a postcard with your next transmission date.  - Your physician wants you to follow-up in: 6 months with Roderic Palau, NP in the [place] 1 year with Chanetta Marshall, NP for Dr. Caryl Comes. You will receive a reminder letter in the mail two months in advance. If you don't receive a letter, please call our office to schedule the follow-up appointment.   Any Additional Special Instructions Will Be Listed Below (If Applicable).     If you need a refill on your cardiac medications before your next appointment, please call your pharmacy.

## 2016-12-03 NOTE — Progress Notes (Signed)
Patient Care Team: Ma Hillock, DO as PCP - General (Family Medicine) Bjorn Loser, MD as Consulting Physician (Urology) Deboraha Sprang, MD as Consulting Physician (Cardiology) Chesley Mires, MD as Consulting Physician (Pulmonary Disease)   HPI  Brittany [name] is a 83 y.o. female Seen in followup for  pacer implanted 12/12 for sinus node dysfunction.  She has atrial fibrillation for which she takes sotalol currently of 120 mg twice daily.   Cardiac evaluation has included a Myoview 8/12 which demonstrated no ischemia and normal left ventricular function  The patient denies chest pain, shortness of breath, nocturnal dyspnea, orthopnea or peripheral edema.  There have been no  lightheadedness or syncope.   she has had some palpitations most notably at night   Thr primarily nightomboembolic risk factors include age-33 hypertension-1 for a CHADS-  score of 2 and a CHADS-VASc score of 4 we had discussions re anticoagulation;  she has been averse to  Anticoagulation  Date Cr K Mg Hgb   5/17   1.9   1/18  0.59 4.5   14.7     Past Medical History:  Diagnosis Date  . Atrial fibrillation ([place])   . Depression   . Elevated parathyroid hormone   . Fracture of orbital floor with routine healing   . GERD (gastroesophageal reflux disease)   . History of colon polyps    benign  . Hypercalcemia   . Hypertension   . Hyponatremia   . Hypothyroid   . Macular degeneration    wet in the right and dry in the left  . OSA on CPAP   . Osteoporosis   . Peripheral vascular disease ([place])   . PSVT (paroxysmal supraventricular tachycardia) ([place])   . Rotator cuff tear   . Sinus node dysfunction ([facility])    a. s/p MDT pacemaker  . Urinary incontinence     Past Surgical History:  Procedure Laterality Date  . BACK SURGERY    . CATARACT EXTRACTION    . CATARACT EXTRACTION W/ INTRAOCULAR LENS  IMPLANT, BILATERAL Bilateral   . COLONOSCOPY    . ESOPHAGOGASTRODUODENOSCOPY    .  LAPAROSCOPIC CHOLECYSTECTOMY  08/11/1999  . LUMBAR DISC SURGERY  02/2014  . ORIF ANKLE FRACTURE Left 04/13/2015   Procedure: OPEN REDUCTION INTERNAL FIXATION (ORIF) LEFT ANKLE FRACTURE;  Surgeon: Meredith Pel, MD;  Location: [facility];  Service: Orthopedics;  Laterality: Left;  . PACEMAKER PLACEMENT Right 2012   a. MDT dual chamber PPM implanted by Dr Caryl Comes   . SHOULDER ARTHROSCOPY W/ ROTATOR CUFF REPAIR Right 08/30/2014   WITH MINI-OPEN ROTATOR CUFF REPAIR AND SUBACROMIAL DECOMPRESSION  . SHOULDER ARTHROSCOPY WITH ROTATOR CUFF REPAIR AND SUBACROMIAL DECOMPRESSION Right 08/30/2014   Procedure: SHOULDER ARTHROSCOPY WITH MINI-OPEN ROTATOR CUFF REPAIR AND SUBACROMIAL DECOMPRESSION;  Surgeon: Meredith Pel, MD;  Location: [facility];  Service: Orthopedics;  Laterality: Right;  RIGHT SHOULDER DIAGNOSTIC OPERATIVE ARTHROSCOPY, SUBACROMIAL DECOMPRESSION, MINI-OPEN ROTATOR CUFF REPAIR.  Marland Kitchen VAGINAL HYSTERECTOMY  1970    Current Outpatient Prescriptions  Medication Sig Dispense Refill  . amLODipine (NORVASC) 10 MG tablet Take 1 tablet (10 mg total) by mouth daily. 90 tablet 1  . aspirin EC 81 MG tablet Take 1 tablet (81 mg total) by mouth daily.    . B Complex-C (B-COMPLEX WITH VITAMIN C) tablet Take 1 tablet by mouth daily.    . Cholecalciferol (VITAMIN D3) 5000 UNITS CAPS Take 1 Can by mouth daily.    . citalopram (CELEXA) 20 MG  tablet TAKE 1 TABLET (20 MG TOTAL) BY MOUTH DAILY. 90 tablet 1  . desmopressin (DDAVP) 0.2 MG tablet Take 1 tablet (0.2 mg total) by mouth daily. 90 tablet 1  . lisinopril (PRINIVIL,ZESTRIL) 40 MG tablet Take 1 tablet (40 mg total) by mouth daily. 90 tablet 1  . magnesium oxide (MAG-OX) 400 MG tablet Take 1 tablet (400 mg total) by mouth daily. 30 tablet 0  . Multiple Vitamin (MULTIVITAMIN WITH MINERALS) TABS tablet Take 1 tablet by mouth daily.    . Multiple Vitamins-Minerals (PRESERVISION AREDS PO) Take 1 tablet by mouth 2 (two) times daily.    Marland Kitchen omeprazole (PRILOSEC) 20 MG  capsule Take 1 capsule (20 mg total) by mouth daily. 90 capsule 1  . sotalol (BETAPACE) 80 MG tablet TAKE 1 AND 1/2 TABLETS BYMOUTH TWICE A DAY 90 tablet 11  . Vitamin D, Ergocalciferol, (DRISDOL) 50000 units CAPS capsule Take 1 capsule (50,000 Units total) by mouth every 7 (seven) days. 8 capsule 0   No current facility-administered medications for this visit.     Allergies  Allergen Reactions  . Adhesive [Tape] Swelling and Rash  . Benzalkonium Chloride Rash  . Neosporin [Neomycin-Polymyxin-Gramicidin] Swelling and Rash      Review of Systems negative except from HPI and PMH  Physical Exam BP (!) 146/80   Pulse 76   Ht 5\' 8"  (1.727 m)   Wt 190 lb (86.2 kg)   LMP 07/22/1968   SpO2 95%   BMI 28.89 kg/m  Well developed and well nourished in no acute distress HENT normal E scleral and icterus clear Neck Supple JVP flat; carotids brisk and full Clear to ausculation  Regular rate and rhythm, no murmurs gallops or rub Soft with active bowel sounds No clubbing cyanosis  Edema Alert and oriented, grossly normal motor and sensory function Skin Warm and Dry  ECG demonstrates atrial pacing at 62 Intervals 25/10/44  Assessment and  Plan   Atrial fibrillation-paroxysmal     Sinus bradycardia   Pacemaker-Medtronic  The patient's device was interrogated.  The information was reviewed. No changes were made in the programming.    Hypertension  Infrequent interval atrial fibrillation longest episode 4 hours   She has refused anticoagulation  we have reviewed this again. Unfortunately, there is another person where she lives to had a stroke which was thought to be related to a brain bleed. For now she would like to continue taking aspirin.  .  palpitations may or may not correlate with episodes of atrial fibrillation that were identified.  We spent more than 50% of our >25 min visit in face to face counseling regarding the above  Current medicines are reviewed at  length with the patient today .  The patient does not  have concerns regarding medicines.

## 2016-12-04 DIAGNOSIS — M47817 Spondylosis without myelopathy or radiculopathy, lumbosacral region: Secondary | ICD-10-CM | POA: Diagnosis not present

## 2016-12-04 DIAGNOSIS — M47816 Spondylosis without myelopathy or radiculopathy, lumbar region: Secondary | ICD-10-CM | POA: Diagnosis not present

## 2016-12-04 LAB — BASIC METABOLIC PANEL
BUN/Creatinine Ratio: 19 (ref 12–28)
BUN: 10 mg/dL (ref 8–27)
CO2: 28 mmol/L (ref 18–29)
Calcium: 11 mg/dL — ABNORMAL HIGH (ref 8.7–10.3)
Chloride: 98 mmol/L (ref 96–106)
Creatinine, Ser: 0.54 mg/dL — ABNORMAL LOW (ref 0.57–1.00)
GFR calc Af Amer: 103 mL/min/{1.73_m2} (ref >59)
GFR calc non Af Amer: 89 mL/min/{1.73_m2} (ref >59)
Glucose: 93 mg/dL (ref 65–99)
Potassium: 5.2 mmol/L (ref 3.5–5.2)
Sodium: 140 mmol/L (ref 134–144)

## 2016-12-04 LAB — MAGNESIUM: Magnesium: 2.1 mg/dL (ref 1.6–2.3)

## 2016-12-06 DIAGNOSIS — S76011D Strain of muscle, fascia and tendon of right hip, subsequent encounter: Secondary | ICD-10-CM | POA: Diagnosis not present

## 2016-12-06 DIAGNOSIS — R262 Difficulty in walking, not elsewhere classified: Secondary | ICD-10-CM | POA: Diagnosis not present

## 2016-12-06 DIAGNOSIS — M25551 Pain in right hip: Secondary | ICD-10-CM | POA: Diagnosis not present

## 2016-12-06 LAB — CUP PACEART INCLINIC DEVICE CHECK
Battery Impedance: 947 Ohm
Battery Remaining Longevity: 57 mo
Battery Voltage: 2.78 V
Brady Statistic AP VP Percent: 0 %
Brady Statistic AP VS Percent: 96 %
Brady Statistic AS VP Percent: 0 %
Brady Statistic AS VS Percent: 3 %
Date Time Interrogation Session: 20180515195312
Implantable Lead Implant Date: 20121213
Implantable Lead Implant Date: 20121213
Implantable Lead Location: 753859
Implantable Lead Location: 753860
Implantable Lead Model: 4076
Implantable Lead Model: 4076
Implantable Lead Serial Number: 835025
Implantable Lead Serial Number: 869269
Implantable Pulse Generator Implant Date: 20121213
Lead Channel Impedance Value: 420 Ohm
Lead Channel Impedance Value: 541 Ohm
Lead Channel Pacing Threshold Amplitude: 0.5 V
Lead Channel Pacing Threshold Amplitude: 0.75 V
Lead Channel Pacing Threshold Pulse Width: 0.4 ms
Lead Channel Pacing Threshold Pulse Width: 0.4 ms
Lead Channel Sensing Intrinsic Amplitude: 15.67 mV
Lead Channel Setting Pacing Amplitude: 2 V
Lead Channel Setting Pacing Amplitude: 2.5 V
Lead Channel Setting Pacing Pulse Width: 0.4 ms
Lead Channel Setting Sensing Sensitivity: 5.6 mV

## 2016-12-09 DIAGNOSIS — S76011D Strain of muscle, fascia and tendon of right hip, subsequent encounter: Secondary | ICD-10-CM | POA: Diagnosis not present

## 2016-12-09 DIAGNOSIS — R262 Difficulty in walking, not elsewhere classified: Secondary | ICD-10-CM | POA: Diagnosis not present

## 2016-12-09 DIAGNOSIS — M25551 Pain in right hip: Secondary | ICD-10-CM | POA: Diagnosis not present

## 2016-12-10 DIAGNOSIS — S76011D Strain of muscle, fascia and tendon of right hip, subsequent encounter: Secondary | ICD-10-CM | POA: Diagnosis not present

## 2016-12-10 DIAGNOSIS — R262 Difficulty in walking, not elsewhere classified: Secondary | ICD-10-CM | POA: Diagnosis not present

## 2016-12-10 DIAGNOSIS — M25551 Pain in right hip: Secondary | ICD-10-CM | POA: Diagnosis not present

## 2016-12-12 DIAGNOSIS — M25551 Pain in right hip: Secondary | ICD-10-CM | POA: Diagnosis not present

## 2016-12-12 DIAGNOSIS — R262 Difficulty in walking, not elsewhere classified: Secondary | ICD-10-CM | POA: Diagnosis not present

## 2016-12-12 DIAGNOSIS — S76011D Strain of muscle, fascia and tendon of right hip, subsequent encounter: Secondary | ICD-10-CM | POA: Diagnosis not present

## 2017-01-06 ENCOUNTER — Telehealth: Payer: Self-pay | Admitting: Internal Medicine

## 2017-01-06 DIAGNOSIS — E875 Hyperkalemia: Secondary | ICD-10-CM

## 2017-01-06 NOTE — Telephone Encounter (Signed)
Patient calling, states that at her last visit she was told to come work and have blood work completed. Patient would like to verify if this is correct?  I did inform patient that you were out of the office and patient stated that she would wait for you.Thanks.

## 2017-01-07 NOTE — Telephone Encounter (Signed)
I spoke with the patient- she is aware she needs to come back and have her BMP rechecked- she is agreeable to coming this Friday 6/22. BMP order placed.

## 2017-01-09 DIAGNOSIS — H40013 Open angle with borderline findings, low risk, bilateral: Secondary | ICD-10-CM | POA: Diagnosis not present

## 2017-01-09 DIAGNOSIS — Z961 Presence of intraocular lens: Secondary | ICD-10-CM | POA: Diagnosis not present

## 2017-01-09 DIAGNOSIS — H04123 Dry eye syndrome of bilateral lacrimal glands: Secondary | ICD-10-CM | POA: Diagnosis not present

## 2017-01-09 DIAGNOSIS — H353133 Nonexudative age-related macular degeneration, bilateral, advanced atrophic without subfoveal involvement: Secondary | ICD-10-CM | POA: Diagnosis not present

## 2017-01-09 DIAGNOSIS — G4733 Obstructive sleep apnea (adult) (pediatric): Secondary | ICD-10-CM | POA: Diagnosis not present

## 2017-01-10 ENCOUNTER — Other Ambulatory Visit: Payer: Medicare Other | Admitting: *Deleted

## 2017-01-10 DIAGNOSIS — M47816 Spondylosis without myelopathy or radiculopathy, lumbar region: Secondary | ICD-10-CM | POA: Diagnosis not present

## 2017-01-10 DIAGNOSIS — E875 Hyperkalemia: Secondary | ICD-10-CM | POA: Diagnosis not present

## 2017-01-10 DIAGNOSIS — M791 Myalgia: Secondary | ICD-10-CM | POA: Diagnosis not present

## 2017-01-10 DIAGNOSIS — M545 Low back pain: Secondary | ICD-10-CM | POA: Diagnosis not present

## 2017-01-11 LAB — BASIC METABOLIC PANEL
BUN/Creatinine Ratio: 14 (ref 12–28)
BUN: 9 mg/dL (ref 8–27)
CO2: 26 mmol/L (ref 20–29)
Calcium: 10.8 mg/dL — ABNORMAL HIGH (ref 8.7–10.3)
Chloride: 99 mmol/L (ref 96–106)
Creatinine, Ser: 0.63 mg/dL (ref 0.57–1.00)
GFR calc Af Amer: 98 mL/min/{1.73_m2} (ref >59)
GFR calc non Af Amer: 85 mL/min/{1.73_m2} (ref >59)
Glucose: 104 mg/dL — ABNORMAL HIGH (ref 65–99)
Potassium: 4.5 mmol/L (ref 3.5–5.2)
Sodium: 140 mmol/L (ref 134–144)

## 2017-01-15 DIAGNOSIS — M1990 Unspecified osteoarthritis, unspecified site: Secondary | ICD-10-CM | POA: Diagnosis not present

## 2017-01-15 DIAGNOSIS — M25561 Pain in right knee: Secondary | ICD-10-CM | POA: Diagnosis not present

## 2017-01-29 DIAGNOSIS — S52502A Unspecified fracture of the lower end of left radius, initial encounter for closed fracture: Secondary | ICD-10-CM | POA: Diagnosis not present

## 2017-01-29 DIAGNOSIS — W19XXXA Unspecified fall, initial encounter: Secondary | ICD-10-CM | POA: Diagnosis not present

## 2017-02-05 DIAGNOSIS — S52502A Unspecified fracture of the lower end of left radius, initial encounter for closed fracture: Secondary | ICD-10-CM | POA: Diagnosis not present

## 2017-02-11 ENCOUNTER — Ambulatory Visit (INDEPENDENT_AMBULATORY_CARE_PROVIDER_SITE_OTHER): Payer: Medicare Other | Admitting: Family Medicine

## 2017-02-11 ENCOUNTER — Encounter: Payer: Self-pay | Admitting: Family Medicine

## 2017-02-11 ENCOUNTER — Telehealth: Payer: Self-pay | Admitting: *Deleted

## 2017-02-11 VITALS — BP 121/73 | HR 60 | Temp 98.3°F | Resp 20 | Ht 68.0 in | Wt 189.5 lb

## 2017-02-11 DIAGNOSIS — I10 Essential (primary) hypertension: Secondary | ICD-10-CM

## 2017-02-11 DIAGNOSIS — I480 Paroxysmal atrial fibrillation: Secondary | ICD-10-CM

## 2017-02-11 DIAGNOSIS — F329 Major depressive disorder, single episode, unspecified: Secondary | ICD-10-CM | POA: Diagnosis not present

## 2017-02-11 DIAGNOSIS — I495 Sick sinus syndrome: Secondary | ICD-10-CM | POA: Diagnosis not present

## 2017-02-11 DIAGNOSIS — F419 Anxiety disorder, unspecified: Secondary | ICD-10-CM | POA: Diagnosis not present

## 2017-02-11 DIAGNOSIS — F32A Depression, unspecified: Secondary | ICD-10-CM

## 2017-02-11 MED ORDER — CITALOPRAM HYDROBROMIDE 20 MG PO TABS: ORAL_TABLET | ORAL | 1 refills | Status: DC

## 2017-02-11 MED ORDER — LISINOPRIL 40 MG PO TABS: 40.0000 mg | ORAL_TABLET | Freq: Every day | ORAL | 1 refills | Status: DC

## 2017-02-11 MED ORDER — AMLODIPINE BESYLATE 10 MG PO TABS: 10.0000 mg | ORAL_TABLET | Freq: Every day | ORAL | 1 refills | Status: DC

## 2017-02-11 NOTE — Progress Notes (Signed)
Patient ID: Brittany [name], female   DOB: 1933/10/07, 83 y.o.   MRN: [id number]   Subjective:    Patient ID: Brittany [name], female    DOB: 01-Apr-1934, 83 y.o.   MRN: [id number]  Hypertension   Hypertension/CAD/A.FIB/Sinus node dysfunctionPacemaker-Medtronic/Hyperlipidemia:  Patient reports compliance with current dictation regimen sotalol 120 mg twice a day, lisinopril 40 mg daily, amlodipine 10 mg daily. Patient denies chest pain, shortness of breath or lower extremity edema. Pt takes a daily baby ASA. Pt is prescribed statin. BMP: 01/10/2017 (mildly elevated calcium- has been slightly elevated intermittently for years per pt) CBC: 08/14/2016 WNL Lipid panel: 08/14/2016 mildly elevated LDL, great HDL Diet: Low sodium Exercise:  RF: HTN, HD brother  Anxiety and depression: She reports compliance with citalopram 20 mg daily. She is still feeling great on this medication and would like refills.   Hypercalcemia:  Pt has had mild elevated calcium the majority of her life and saw a specialist on a few occassions that did not feel it need to be followed routinely. She doe snot take calcium supplements, She drinks one glass of skim milk a day, she has had mild vit d insufficiency, she does not drink much water in day.   Depression screen [facility] 2/9 02/11/2017 05/24/2016 08/25/2015  Decreased Interest 0 0 0  Down, Depressed, Hopeless 0 0 0  PHQ - 2 Score 0 0 0  Altered sleeping 0 - -  Tired, decreased energy 0 - -  Change in appetite 0 - -  Feeling bad or failure about yourself  0 - -  Trouble concentrating 1 - -  Moving slowly or fidgety/restless 0 - -  Suicidal thoughts 0 - -  PHQ-9 Score 1 - -    Past Medical History:  Diagnosis Date  . Atrial fibrillation ([place])   . Depression   . Elevated parathyroid hormone   . Fracture of orbital floor with routine healing   . GERD (gastroesophageal reflux disease)   . History of colon polyps    benign  . Hypercalcemia   . Hypertension   . Hyponatremia    . Hypothyroid   . Macular degeneration    wet in the right and dry in the left  . OSA on CPAP   . Osteoporosis   . Peripheral vascular disease ([place])   . PSVT (paroxysmal supraventricular tachycardia) ([place])   . Rotator cuff tear   . Sinus node dysfunction ([facility])    a. s/p MDT pacemaker  . Urinary incontinence    Allergies  Allergen Reactions  . Adhesive [Tape] Swelling and Rash  . Benzalkonium Chloride Rash  . Neosporin [Neomycin-Polymyxin-Gramicidin] Swelling and Rash   Allergies as of 02/11/2017      Reactions   Adhesive [tape] Swelling, Rash   Benzalkonium Chloride Rash   Neosporin [neomycin-polymyxin-gramicidin] Swelling, Rash      Medication List       Accurate as of 02/11/17  9:37 AM. Always use your most recent med list.          amLODipine 10 MG tablet Commonly known as:  NORVASC Take 1 tablet (10 mg total) by mouth daily.   aspirin EC 81 MG tablet Take 1 tablet (81 mg total) by mouth daily.   B-complex with vitamin C tablet Take 1 tablet by mouth daily.   citalopram 20 MG tablet Commonly known as:  CELEXA TAKE 1 TABLET (20 MG TOTAL) BY MOUTH DAILY.   desmopressin 0.2 MG tablet Commonly known as:  DDAVP Take 1 tablet (  0.2 mg total) by mouth daily.   lisinopril 40 MG tablet Commonly known as:  PRINIVIL,ZESTRIL Take 1 tablet (40 mg total) by mouth daily.   magnesium oxide 400 MG tablet Commonly known as:  MAG-OX Take 1 tablet (400 mg total) by mouth daily.   multivitamin with minerals Tabs tablet Take 1 tablet by mouth daily.   omeprazole 20 MG capsule Commonly known as:  PRILOSEC Take 1 capsule (20 mg total) by mouth daily.   PRESERVISION AREDS PO Take 1 tablet by mouth 2 (two) times daily.   sotalol 80 MG tablet Commonly known as:  BETAPACE Take 1 & 1/2 tablets (120 mg) by mouth twice daily   Vitamin D3 5000 units Caps Take 1 Can by mouth daily.       Review of Systems  Psychiatric/Behavioral: Positive for depression.    Negative, with the exception of above mentioned in HPI     Objective:   Physical Exam BP 121/73 (BP Location: Left Arm, Patient Position: Sitting, Cuff Size: Normal)   Pulse 60   Temp 98.3 F (36.8 C)   Resp 20   Ht 5\' 8"  (1.727 m)   Wt 189 lb 8 oz (86 kg)   LMP 07/22/1968   SpO2 97%   BMI 28.81 kg/m  Gen: Afebrile. No acute distress. Pleasant caucasian female. HENT: AT. [place].  MMM.  Eyes:Pupils Equal Round Reactive to light, Extraocular movements intact,  Conjunctiva without redness, discharge or icterus. Neck/lymp/endocrine: Supple,no lymphadenopathy, no thyromegaly CV: RRR no murmur, no edema, +2/4 P posterior tibialis pulses Chest: CTAB, no wheeze or crackles Abd: Soft.. NTND. BS present.  Neuro:  Normal gait. PERLA. EOMi. Alert. Oriented.  Psych: Normal affect, dress and demeanor. Normal speech. Normal thought content and judgment.    Assessment & Plan:  Brittany [name] is a 83 y.o. female present for follow up chronic medical conditions.   Essential hypertension, benign/Sinus node dysfunction/atrial fibrillation/hyperlipidemia - stable today. - Refill on Lisinopril, Norvasc - Patient now taking lisinopril 40 mg and Norvasc 10 mg daily.  - Continue Betapace for atrial fibrillation/sinus node dysfunction--> prescribed by cardiology - Continue lisinopril 40 mg and Norvasc 10 mg, refills placed today if needed.  - Follow-up in 6 months for hypertension - Continue baby ASA    Vitamin D deficiency/Osteopenia - recheck level today, last level low. She is supplementing.  - Osteopenia, last DEXA 2014, will repeat on next [place] - VITAMIN D 25 Hydroxy (Vit-D Deficiency, Fractures); Future  Anxiety and depression Doing well on Celexa. Refill provided today for 6 months.   F/U 6 months chronic conditions F/U yearly for [place]  Electronically Signed by: Howard Pouch, DO  primary Care- [place]

## 2017-02-11 NOTE — Telephone Encounter (Signed)
Pt called stating that she called [facility] to see they had all 3 medications ready (amlodipine, citalopram and lisinopril). She stated that she they had the amlodipine and lisinopril but have not received the Rx for citalopram.

## 2017-02-11 NOTE — Telephone Encounter (Signed)
Spoke with pharmacy they have that Rx. They will notify patient when ready for pickup

## 2017-02-11 NOTE — Patient Instructions (Signed)
It was great to see you today.  Bp looks great.  Will get blood work to retest calcium and parathyroid/vit d .   Followup in 6 months on chronic medical conditions.    Please help [place] help you:  We are honored you have chosen [facility] for your Primary Care home. Below you will find basic instructions that you may need to access in the future. Please help [place] help you by reading the instructions, which cover many of the frequent questions we experience.   Prescription refills and request:  -In order to allow more efficient response time, please call your pharmacy for all refills. They will forward the request electronically to [place]. This allows for the quickest possible response. Request left on a nurse line can take longer to refill, since these are checked as time allows between office patients and other phone calls.  - refill request can take up to 3-5 working days to complete.  - If request is sent electronically and request is appropiate, it is usually completed in 1-2 business days.  - all patients will need to be seen routinely for all chronic medical conditions requiring prescription medications (see follow-up below). If you are overdue for follow up on your condition, you will be asked to make an appointment and we will call in enough medication to cover you until your appointment (up to 30 days).  - all controlled substances will require a face to face visit to request/refill.  - if you desire your prescriptions to go through a new pharmacy, and have an active script at original pharmacy, you will need to call your pharmacy and have scripts transferred to new pharmacy. This is completed between the pharmacy locations and not by your provider.    Results: If any images or labs were ordered, it can take up to 1 week to get results depending on the test ordered and the lab/facility running and resulting the test. - Normal or stable results, which do not need further discussion, may be  released to your mychart immediately with attached note to you. A call may not be generated for normal results. Please make certain to sign up for mychart. If you have questions on how to activate your mychart you can call the front office.  - If your results need further discussion, our office will attempt to contact you via phone, and if unable to reach you after 2 attempts, we will release your abnormal result to your mychart with instructions.  - All results will be automatically released in mychart after 1 week.  - Your provider will provide you with explanation and instruction on all relevant material in your results. Please keep in mind, results and labs may appear confusing or abnormal to the untrained eye, but it does not mean they are actually abnormal for you personally. If you have any questions about your results that are not covered, or you desire more detailed explanation than what was provided, you should make an appointment with your provider to do so.   Our office handles many outgoing and incoming calls daily. If we have not contacted you within 1 week about your results, please check your mychart to see if there is a message first and if not, then contact our office.  In helping with this matter, you help decrease call volume, and therefore allow [place] to be able to respond to patients needs more efficiently.   Acute office visits (sick visit):  An acute visit is intended  for a new problem and are scheduled in shorter time slots to allow schedule openings for patients with new problems. This is the appropriate visit to discuss a new problem. In order to provide you with excellent quality medical care with proper time for you to explain your problem, have an exam and receive treatment with instructions, these appointments should be limited to one new problem per visit. If you experience a new problem, in which you desire to be addressed, please make an acute office visit, we save openings on  the schedule to accommodate you. Please do not save your new problem for any other type of visit, let us take care of it properly and quickly for you.   Follow up visits:  Depending on your condition(s) your provider will need to see you routinely in order to provide you with quality care and prescribe medication(s). Most chronic conditions (Example: hypertension, Diabetes, depression/anxiety... etc), require visits a couple times a year. Your provider will instruct you on proper follow up for your personal medical conditions and history. Please make certain to make follow up appointments for your condition as instructed. Failing to do so could result in lapse in your medication treatment/refills. If you request a refill, and are overdue to be seen on a condition, we will always provide you with a 30 day script (once) to allow you time to schedule.    Medicare wellness (well visit): - we have a wonderful Nurse Maudie Mercury), that will meet with you and provide you will yearly medicare wellness visits. These visits should occur yearly (can not be scheduled less than 1 calendar year apart) and cover preventive health, immunizations, advance directives and screenings you are entitled to yearly through your medicare benefits. Do not miss out on your entitled benefits, this is when medicare will pay for these benefits to be ordered for you.  These are strongly encouraged by your provider and is the appropriate type of visit to make certain you are up to date with all preventive health benefits. If you have not had your medicare wellness exam in the last 12 months, please make certain to schedule one by calling the office and schedule your medicare wellness with Maudie Mercury as soon as possible.   Yearly physical (well visit):  - Adults are recommended to be seen yearly for physicals. Check with your insurance and date of your last physical, most insurances require one calendar year between physicals. Physicals include all  preventive health topics, screenings, medical exam and labs that are appropriate for gender/age and history. You may have fasting labs needed at this visit. This is a well visit (not a sick visit), new problems should not be covered during this visit (see acute visit).  - Pediatric patients are seen more frequently when they are younger. Your provider will advise you on well child visit timing that is appropriate for your their age. - This is not a medicare wellness visit. Medicare wellness exams do not have an exam portion to the visit. Some medicare companies allow for a physical, some do not allow a yearly physical. If your medicare allows a yearly physical you can schedule the medicare wellness with our nurse Maudie Mercury and have your physical with your provider after, on the same day. Please check with insurance for your full benefits.   Late Policy/No Shows:  - all new patients should arrive 15-30 minutes earlier than appointment to allow [place] time  to  obtain all personal demographics,  insurance information and for you  to complete office paperwork. - All established patients should arrive 10-15 minutes earlier than appointment time to update all information and be checked in .  - In our best efforts to run on time, if you are late for your appointment you will be asked to either reschedule or if able, we will work you back into the schedule. There will be a wait time to work you back in the schedule,  depending on availability.  - If you are unable to make it to your appointment as scheduled, please call 24 hours ahead of time to allow [place] to fill the time slot with someone else who needs to be seen. If you do not cancel your appointment ahead of time, you may be charged a no show fee.

## 2017-02-12 ENCOUNTER — Telehealth: Payer: Self-pay | Admitting: Family Medicine

## 2017-02-12 ENCOUNTER — Encounter: Payer: Self-pay | Admitting: Family Medicine

## 2017-02-12 DIAGNOSIS — M1711 Unilateral primary osteoarthritis, right knee: Secondary | ICD-10-CM | POA: Diagnosis not present

## 2017-02-12 DIAGNOSIS — E210 Primary hyperparathyroidism: Secondary | ICD-10-CM

## 2017-02-12 DIAGNOSIS — E213 Hyperparathyroidism, unspecified: Secondary | ICD-10-CM | POA: Insufficient documentation

## 2017-02-12 DIAGNOSIS — M25532 Pain in left wrist: Secondary | ICD-10-CM | POA: Diagnosis not present

## 2017-02-12 DIAGNOSIS — M1712 Unilateral primary osteoarthritis, left knee: Secondary | ICD-10-CM | POA: Diagnosis not present

## 2017-02-12 LAB — PTH, INTACT AND CALCIUM
Calcium: 10.6 mg/dL — ABNORMAL HIGH (ref 8.6–10.4)
PTH: 78 pg/mL — ABNORMAL HIGH (ref 14–64)

## 2017-02-12 LAB — VITAMIN D 25 HYDROXY (VIT D DEFICIENCY, FRACTURES): Vit D, 25-Hydroxy: 30 ng/mL (ref 30–100)

## 2017-02-12 NOTE — Telephone Encounter (Signed)
Please call pt: - her vit d is normal. Continue daily supplement.  - her calcium is mildly elevated and her Parathyroid hormone level is mildly elevated.     - She was encouraged to drink 6-8 glasses of water today. Make sure to take her vitamin D supplementation to keep her vitamin D level normal, this will help with the elevated calcium and parathyroid hormone. Routine exercise is also helpful. -I would like her to have a bone density exam, her last was about 4 years ago. An overactive parathyroid can cause progression to bone softening. She has had 2 fractures in the last 2 years, so this is concerning. - We will routinely monitor her calcium and parathyroid with her routine appts every 6 months, since she is basically asymptomatic, unless her bone density results come back with osteoporosis then I would encourage her to see a specialist.  Bone density ordered

## 2017-02-13 NOTE — Telephone Encounter (Signed)
Spoke with patient reviewed results and instructions patient verbalized understanding of all information. Bone Density scan scheduled for 03/03/17.

## 2017-02-21 DIAGNOSIS — L814 Other melanin hyperpigmentation: Secondary | ICD-10-CM | POA: Diagnosis not present

## 2017-02-21 DIAGNOSIS — L570 Actinic keratosis: Secondary | ICD-10-CM | POA: Diagnosis not present

## 2017-02-21 DIAGNOSIS — D485 Neoplasm of uncertain behavior of skin: Secondary | ICD-10-CM | POA: Diagnosis not present

## 2017-02-21 DIAGNOSIS — C44319 Basal cell carcinoma of skin of other parts of face: Secondary | ICD-10-CM | POA: Diagnosis not present

## 2017-03-03 ENCOUNTER — Ambulatory Visit
Admission: RE | Admit: 2017-03-03 | Discharge: 2017-03-03 | Disposition: A | Discharge status: Home / Self Care | Payer: Medicare Other | Source: Ambulatory Visit | Attending: Family Medicine | Admitting: Family Medicine

## 2017-03-03 ENCOUNTER — Other Ambulatory Visit: Payer: Medicare Other

## 2017-03-03 DIAGNOSIS — M810 Age-related osteoporosis without current pathological fracture: Secondary | ICD-10-CM | POA: Diagnosis not present

## 2017-03-03 DIAGNOSIS — E210 Primary hyperparathyroidism: Secondary | ICD-10-CM

## 2017-03-03 DIAGNOSIS — Z780 Asymptomatic menopausal state: Secondary | ICD-10-CM | POA: Diagnosis not present

## 2017-03-03 DIAGNOSIS — M25532 Pain in left wrist: Secondary | ICD-10-CM | POA: Diagnosis not present

## 2017-03-04 ENCOUNTER — Telehealth: Payer: Self-pay | Admitting: Family Medicine

## 2017-03-04 DIAGNOSIS — M810 Age-related osteoporosis without current pathological fracture: Secondary | ICD-10-CM

## 2017-03-04 MED ORDER — ALENDRONATE SODIUM 70 MG PO TABS: 70.0000 mg | ORAL_TABLET | ORAL | 11 refills | Status: DC

## 2017-03-04 NOTE — Telephone Encounter (Signed)
Patient notified and scheduled an appointment to discuss results and treatment in more detail with Dr. Raoul Pitch, for 03/10/17.

## 2017-03-04 NOTE — Telephone Encounter (Signed)
Please call pt:  - her bone mineral density did result with osteoporosis, which were concerned about given her recent fracture(s) . First line therapy is with once weekly fosamax dosing.  - We will try this first. Pick a day of the week, take with 8  Ounces of water on an empty stomach and needs to remain upright for 30 minutes after taking (no laying gback down). If tolerating no need to follow up, refills will be provided on yearly basis. If not tolerating, worsening GERD like symptoms, will need to discuss other options.

## 2017-03-05 ENCOUNTER — Ambulatory Visit (INDEPENDENT_AMBULATORY_CARE_PROVIDER_SITE_OTHER): Payer: Medicare Other | Admitting: *Deleted

## 2017-03-05 DIAGNOSIS — I495 Sick sinus syndrome: Secondary | ICD-10-CM | POA: Diagnosis not present

## 2017-03-05 DIAGNOSIS — R001 Bradycardia, unspecified: Secondary | ICD-10-CM

## 2017-03-05 NOTE — Telephone Encounter (Signed)
Error

## 2017-03-06 LAB — CUP PACEART REMOTE DEVICE CHECK
Battery Impedance: 1057 Ohm
Battery Remaining Longevity: 53 mo
Battery Voltage: 2.77 V
Brady Statistic AP VP Percent: 0 %
Brady Statistic AP VS Percent: 96 %
Brady Statistic AS VP Percent: 0 %
Brady Statistic AS VS Percent: 4 %
Date Time Interrogation Session: 20180815191404
Implantable Lead Implant Date: 20121213
Implantable Lead Implant Date: 20121213
Implantable Lead Location: 753859
Implantable Lead Location: 753860
Implantable Lead Model: 4076
Implantable Lead Model: 4076
Implantable Lead Serial Number: 835025
Implantable Lead Serial Number: 869269
Implantable Pulse Generator Implant Date: 20121213
Lead Channel Impedance Value: 415 Ohm
Lead Channel Impedance Value: 547 Ohm
Lead Channel Pacing Threshold Amplitude: 0.5 V
Lead Channel Pacing Threshold Amplitude: 0.625 V
Lead Channel Pacing Threshold Pulse Width: 0.4 ms
Lead Channel Pacing Threshold Pulse Width: 0.4 ms
Lead Channel Setting Pacing Amplitude: 2 V
Lead Channel Setting Pacing Amplitude: 2.5 V
Lead Channel Setting Pacing Pulse Width: 0.4 ms
Lead Channel Setting Sensing Sensitivity: 5.6 mV

## 2017-03-06 NOTE — Progress Notes (Signed)
Remote pacemaker check. 

## 2017-03-10 ENCOUNTER — Ambulatory Visit (INDEPENDENT_AMBULATORY_CARE_PROVIDER_SITE_OTHER): Payer: Medicare Other | Admitting: Family Medicine

## 2017-03-10 ENCOUNTER — Encounter: Payer: Self-pay | Admitting: Family Medicine

## 2017-03-10 VITALS — BP 124/69 | HR 64 | Temp 98.1°F | Resp 20 | Ht 68.0 in | Wt 189.8 lb

## 2017-03-10 DIAGNOSIS — E210 Primary hyperparathyroidism: Secondary | ICD-10-CM

## 2017-03-10 DIAGNOSIS — M810 Age-related osteoporosis without current pathological fracture: Secondary | ICD-10-CM | POA: Diagnosis not present

## 2017-03-10 DIAGNOSIS — E559 Vitamin D deficiency, unspecified: Secondary | ICD-10-CM | POA: Diagnosis not present

## 2017-03-10 NOTE — Patient Instructions (Signed)
Start fosamax. If you experience any GI symptoms let  us know. Score: -1.2 (2014) ---> -2.7 (2018) We will monitor your bone density every 2 years.   Continue vit D supplement.      Osteoporosis Osteoporosis happens when your bones become thinner and weaker. Weak bones can break (fracture) more easily when you slip or fall. Bones most at risk of breaking are in the hip, wrist, and spine. Follow these instructions at home:  Get enough calcium and vitamin D. These nutrients are good for your bones.  Exercise as told by your doctor.  Do not use any tobacco products. This includes cigarettes, chewing tobacco, and electronic cigarettes. If you need help quitting, ask your doctor.  Limit the amount of alcohol you drink.  Take medicines only as told by your doctor.  Keep all follow-up visits as told by your doctor. This is important.  Take care at home to prevent falls. Some ways to do this are: ? Keep rooms well lit and tidy. ? Put safety rails on your stairs. ? Put a rubber mat in the bathroom and other places that are often wet or slippery. Get help right away if:  You fall.  You hurt yourself. This information is not intended to replace advice given to you by your health care provider. Make sure you discuss any questions you have with your health care provider. Document Released: 09/30/2011 Document Revised: 12/14/2015 Document Reviewed: 12/16/2013 Elsevier Interactive Patient Education  [employer].

## 2017-03-10 NOTE — Progress Notes (Signed)
Brittany [name] , 1933/11/20, 83 y.o., female MRN: [id number] Patient Care Team    Relationship Specialty Notifications Start End  Ma Hillock, DO PCP - General Family Medicine  02/24/15   Bjorn Loser, MD Consulting Physician Urology  05/24/16   Deboraha Sprang, MD Consulting Physician Cardiology  05/24/16   Chesley Mires, MD Consulting Physician Pulmonary Disease  05/24/16     Chief Complaint  Patient presents with  . Osteoporosis    discuss results and medication options     Subjective: Pt presents for an OV To discuss her abnormal bone density scan. Her bone density completed this month resulted with osteoporosis. Right forearm the area of highest concern with -2.7. Patient has a history of mild hypercalcemia, mild hyperparathyroidism for many years. She brings with her labs dating back a couple decades with calcium at its highest of 11.7. She wanted to discuss all findings and medication before starting Fosamax which was recommended to her. She has had an ankle fracture and a left wrist fracture or written last year. Last labs 02/11/2017. Vitamin D 30, PTH 78, 10.6  Depression screen [facility] 2/9 02/11/2017 05/24/2016 08/25/2015  Decreased Interest 0 0 0  Down, Depressed, Hopeless 0 0 0  PHQ - 2 Score 0 0 0  Altered sleeping 0 - -  Tired, decreased energy 0 - -  Change in appetite 0 - -  Feeling bad or failure about yourself  0 - -  Trouble concentrating 1 - -  Moving slowly or fidgety/restless 0 - -  Suicidal thoughts 0 - -  PHQ-9 Score 1 - -    Allergies  Allergen Reactions  . Adhesive [Tape] Swelling and Rash  . Benzalkonium Chloride Rash  . Neosporin [Neomycin-Polymyxin-Gramicidin] Swelling and Rash   Social History  Substance Use Topics  . Smoking status: Former Smoker    Packs/day: 1.00    Years: 45.00    Types: Cigarettes  . Smokeless tobacco: Never Used     Comment: quit smoking in 1992  . Alcohol use 16.8 oz/week    28 Glasses of wine per week     Comment:  08/30/2014 "16oz wine q night"   Past Medical History:  Diagnosis Date  . Ankle fracture 2016  . Atrial fibrillation ([place])   . Depression   . Elevated parathyroid hormone   . Fracture of orbital floor with routine healing   . GERD (gastroesophageal reflux disease)   . History of colon polyps    benign  . Hypercalcemia   . Hypertension   . Hyponatremia   . Hypothyroid   . Macular degeneration    wet in the right and dry in the left  . OSA on CPAP   . Osteoporosis   . Peripheral vascular disease ([place])   . PSVT (paroxysmal supraventricular tachycardia) ([place])   . Rotator cuff tear   . Sinus node dysfunction ([facility])    a. s/p MDT pacemaker  . Urinary incontinence   . Wrist fracture 2018   Past Surgical History:  Procedure Laterality Date  . BACK SURGERY    . CATARACT EXTRACTION    . CATARACT EXTRACTION W/ INTRAOCULAR LENS  IMPLANT, BILATERAL Bilateral   . COLONOSCOPY    . ESOPHAGOGASTRODUODENOSCOPY    . LAPAROSCOPIC CHOLECYSTECTOMY  08/11/1999  . LUMBAR DISC SURGERY  02/2014  . ORIF ANKLE FRACTURE Left 04/13/2015   Procedure: OPEN REDUCTION INTERNAL FIXATION (ORIF) LEFT ANKLE FRACTURE;  Surgeon: Meredith Pel, MD;  Location: [facility];  Service: Orthopedics;  Laterality: Left;  . PACEMAKER PLACEMENT Right 2012   a. MDT dual chamber PPM implanted by Dr Caryl Comes   . SHOULDER ARTHROSCOPY W/ ROTATOR CUFF REPAIR Right 08/30/2014   WITH MINI-OPEN ROTATOR CUFF REPAIR AND SUBACROMIAL DECOMPRESSION  . SHOULDER ARTHROSCOPY WITH ROTATOR CUFF REPAIR AND SUBACROMIAL DECOMPRESSION Right 08/30/2014   Procedure: SHOULDER ARTHROSCOPY WITH MINI-OPEN ROTATOR CUFF REPAIR AND SUBACROMIAL DECOMPRESSION;  Surgeon: Meredith Pel, MD;  Location: [place];  Service: Orthopedics;  Laterality: Right;  RIGHT SHOULDER DIAGNOSTIC OPERATIVE ARTHROSCOPY, SUBACROMIAL DECOMPRESSION, MINI-OPEN ROTATOR CUFF REPAIR.  Marland Kitchen VAGINAL HYSTERECTOMY  1970   Family History  Problem Relation Age of Onset  . Colon cancer Mother         colon  . Cancer Father        unknown type  . Stroke Sister   . Skin cancer Brother   . Heart disease Brother   . Breast cancer Paternal Aunt   . Asthma Brother    Allergies as of 03/10/2017      Reactions   Adhesive [tape] Swelling, Rash   Benzalkonium Chloride Rash   Neosporin [neomycin-polymyxin-gramicidin] Swelling, Rash      Medication List       Accurate as of 03/10/17  3:26 PM. Always use your most recent med list.          alendronate 70 MG tablet Commonly known as:  FOSAMAX Take 1 tablet (70 mg total) by mouth every 7 (seven) days. Take with a full glass of water on an empty stomach.   amLODipine 10 MG tablet Commonly known as:  NORVASC Take 1 tablet (10 mg total) by mouth daily.   aspirin EC 81 MG tablet Take 1 tablet (81 mg total) by mouth daily.   B-complex with vitamin C tablet Take 1 tablet by mouth daily.   citalopram 20 MG tablet Commonly known as:  CELEXA TAKE 1 TABLET (20 MG TOTAL) BY MOUTH DAILY.   desmopressin 0.2 MG tablet Commonly known as:  DDAVP Take 1 tablet (0.2 mg total) by mouth daily.   lisinopril 40 MG tablet Commonly known as:  PRINIVIL,ZESTRIL Take 1 tablet (40 mg total) by mouth daily.   magnesium oxide 400 MG tablet Commonly known as:  MAG-OX Take 1 tablet (400 mg total) by mouth daily.   multivitamin with minerals Tabs tablet Take 1 tablet by mouth daily.   omeprazole 20 MG capsule Commonly known as:  PRILOSEC Take 1 capsule (20 mg total) by mouth daily.   PRESERVISION AREDS PO Take 1 tablet by mouth 2 (two) times daily.   sotalol 80 MG tablet Commonly known as:  BETAPACE Take 1 & 1/2 tablets (120 mg) by mouth twice daily   Vitamin D3 5000 units Caps Take 1 Can by mouth daily.       All past medical history, surgical history, allergies, family history, immunizations andmedications were updated in the EMR today and reviewed under the history and medication portions of their EMR.     ROS: Negative, with the  exception of above mentioned in HPI   Objective:  BP 124/69 (BP Location: Left Arm, Patient Position: Sitting, Cuff Size: Normal)   Pulse 64   Temp 98.1 F (36.7 C)   Resp 20   Ht 5\' 8"  (1.727 m)   Wt 189 lb 12 oz (86.1 kg)   LMP 07/22/1968   SpO2 98%   BMI 28.85 kg/m  Body mass index is 28.85 kg/m. Gen: Afebrile. No acute distress. Nontoxic  in appearance, well developed, well nourished.  HENT: AT. [place].  MMM Eyes:Pupils Equal Round Reactive to light, Extraocular movements intact,  Conjunctiva without redness, discharge or icterus. Neuro: Normal gait. PERLA. EOMi. Alert. Oriented x3  Psych: Normal affect, dress and demeanor. Normal speech. Normal thought content and judgment.  No exam data present No results found. No results found for this or any previous visit (from the past 24 hour(s)).  Assessment/Plan: Jay Kempe is a 83 y.o. female present for OV for  Osteoporosis, unspecified osteoporosis type, unspecified pathological fracture presence Primary hyperparathyroidism ([place]) Vitamin D deficiency - Discussed these Fosamax, its role in helping strengthening bones. Briefly discussed potential side effects to medication. Also discussed vitamin D supplementation importance and weightbearing exercise. - Bone density scan 2 years. - PTH/calcium/vitamin D is monitored with her hypertension visits. - Follow-up as needed for Fosamax use.  Reviewed expectations re: course of current medical issues.  Discussed self-management of symptoms.  Outlined signs and symptoms indicating need for more acute intervention.  Patient verbalized understanding and all questions were answered.  Patient received an After-Visit Summary.    No orders of the defined types were placed in this encounter.    Note is dictated utilizing voice recognition software. Although note has been proof read prior to signing, occasional typographical errors still can be missed. If any questions arise, please  do not hesitate to call for verification.   electronically signed by:  Howard Pouch, DO  [facility] [place]    --

## 2017-03-18 ENCOUNTER — Encounter: Payer: Self-pay | Admitting: Cardiology

## 2017-03-31 DIAGNOSIS — M25532 Pain in left wrist: Secondary | ICD-10-CM | POA: Diagnosis not present

## 2017-04-01 ENCOUNTER — Encounter: Payer: Self-pay | Admitting: Cardiology

## 2017-04-07 ENCOUNTER — Other Ambulatory Visit: Payer: Self-pay | Admitting: Family Medicine

## 2017-04-07 DIAGNOSIS — Z1231 Encounter for screening mammogram for malignant neoplasm of breast: Secondary | ICD-10-CM

## 2017-04-10 DIAGNOSIS — L821 Other seborrheic keratosis: Secondary | ICD-10-CM | POA: Diagnosis not present

## 2017-04-10 DIAGNOSIS — C44319 Basal cell carcinoma of skin of other parts of face: Secondary | ICD-10-CM | POA: Diagnosis not present

## 2017-04-25 DIAGNOSIS — M1711 Unilateral primary osteoarthritis, right knee: Secondary | ICD-10-CM | POA: Diagnosis not present

## 2017-04-29 ENCOUNTER — Ambulatory Visit (HOSPITAL_BASED_OUTPATIENT_CLINIC_OR_DEPARTMENT_OTHER)
Admission: RE | Admit: 2017-04-29 | Discharge: 2017-04-29 | Disposition: A | Discharge status: Home / Self Care | Payer: Medicare Other | Source: Ambulatory Visit | Attending: Family Medicine | Admitting: Family Medicine

## 2017-04-29 ENCOUNTER — Encounter (HOSPITAL_BASED_OUTPATIENT_CLINIC_OR_DEPARTMENT_OTHER): Payer: Self-pay

## 2017-04-29 DIAGNOSIS — R928 Other abnormal and inconclusive findings on diagnostic imaging of breast: Secondary | ICD-10-CM | POA: Diagnosis not present

## 2017-04-29 DIAGNOSIS — Z1231 Encounter for screening mammogram for malignant neoplasm of breast: Secondary | ICD-10-CM | POA: Diagnosis not present

## 2017-05-01 ENCOUNTER — Encounter: Payer: Self-pay | Admitting: Family Medicine

## 2017-05-01 DIAGNOSIS — R928 Other abnormal and inconclusive findings on diagnostic imaging of breast: Secondary | ICD-10-CM | POA: Insufficient documentation

## 2017-05-02 ENCOUNTER — Other Ambulatory Visit: Payer: Self-pay | Admitting: Family Medicine

## 2017-05-02 DIAGNOSIS — R928 Other abnormal and inconclusive findings on diagnostic imaging of breast: Secondary | ICD-10-CM

## 2017-05-05 ENCOUNTER — Ambulatory Visit: Admission: RE | Admit: 2017-05-05 | Payer: Medicare Other | Source: Ambulatory Visit

## 2017-05-05 ENCOUNTER — Ambulatory Visit
Admission: RE | Admit: 2017-05-05 | Discharge: 2017-05-05 | Disposition: A | Discharge status: Home / Self Care | Payer: Medicare Other | Source: Ambulatory Visit | Attending: Family Medicine | Admitting: Family Medicine

## 2017-05-05 ENCOUNTER — Other Ambulatory Visit: Payer: Medicare Other

## 2017-05-05 ENCOUNTER — Ambulatory Visit (HOSPITAL_BASED_OUTPATIENT_CLINIC_OR_DEPARTMENT_OTHER): Payer: Medicare Other

## 2017-05-05 DIAGNOSIS — R928 Other abnormal and inconclusive findings on diagnostic imaging of breast: Secondary | ICD-10-CM

## 2017-05-05 DIAGNOSIS — Z23 Encounter for immunization: Secondary | ICD-10-CM | POA: Diagnosis not present

## 2017-05-29 NOTE — Progress Notes (Addendum)
Subjective:   Brittany [name] is a 83 y.o. female who presents for Medicare Annual (Subsequent) preventive examination.  Review of Systems:  No ROS.  Medicare Wellness Visit. Additional risk factors are reflected in the social history.  Cardiac Risk Factors include: advanced age (>15men, >90 women);hypertension   Sleep patterns: Sleeps 9 hours. Uses CPAP Home Safety/Smoke Alarms: Feels safe in home. Smoke alarms in place.  Living environment; residence and Firearm Safety: Lives with husband at [place] Safety/Bike Helmet: Wears seat belt.   Female:   [id number]       Mammo-04/29/2017, negative.        Dexa scan-03/03/2017, Osteoporosis. Fosamax weekly.         CCS-Colonoscopy 2013, pt reports normal.      Objective:     Vitals: BP 126/70 (BP Location: Left Arm, Patient Position: Sitting, Cuff Size: Normal)   Pulse 66   Ht 5\' 8"  (1.727 m)   Wt 188 lb 12.8 oz (85.6 kg)   LMP 07/22/1968   SpO2 98%   BMI 28.71 kg/m   Body mass index is 28.71 kg/m.   Tobacco Social History   Tobacco Use  Smoking Status Former Smoker  . Packs/day: 1.00  . Years: 45.00  . Pack years: 45.00  . Types: Cigarettes  Smokeless Tobacco Never Used  Tobacco Comment   quit smoking in 1992     Counseling given: Not Answered Comment: quit smoking in 1992   Past Medical History:  Diagnosis Date  . Ankle fracture 2016  . Atrial fibrillation ([place])   . Depression   . Elevated parathyroid hormone   . Fracture of orbital floor with routine healing   . GERD (gastroesophageal reflux disease)   . History of colon polyps    benign  . Hypercalcemia   . Hypertension   . Hyponatremia   . Hypothyroid   . Macular degeneration    wet in the right and dry in the left  . OSA on CPAP   . Osteoporosis   . Peripheral vascular disease ([place])   . PSVT (paroxysmal supraventricular tachycardia) ([place])   . Rotator cuff tear   . Sinus node dysfunction ([facility])    a. s/p MDT  pacemaker  . Urinary incontinence   . Wrist fracture 2018   Past Surgical History:  Procedure Laterality Date  . BACK SURGERY    . CATARACT EXTRACTION    . CATARACT EXTRACTION W/ INTRAOCULAR LENS  IMPLANT, BILATERAL Bilateral   . COLONOSCOPY    . ESOPHAGOGASTRODUODENOSCOPY    . LAPAROSCOPIC CHOLECYSTECTOMY  08/11/1999  . LUMBAR DISC SURGERY  02/2014  . PACEMAKER PLACEMENT Right 2012   a. MDT dual chamber PPM implanted by Dr Caryl Comes   . SHOULDER ARTHROSCOPY W/ ROTATOR CUFF REPAIR Right 08/30/2014   WITH MINI-OPEN ROTATOR CUFF REPAIR AND SUBACROMIAL DECOMPRESSION  . VAGINAL HYSTERECTOMY  1970   Family History  Problem Relation Age of Onset  . Colon cancer Mother        colon  . Cancer Father        unknown type  . Stroke Sister   . Skin cancer Brother   . Heart disease Brother   . Breast cancer Paternal Aunt   . Asthma Brother    Social History   Substance and Sexual Activity  Sexual Activity Yes  . Birth control/protection: Surgical    Outpatient Encounter Medications as of 05/30/2017  Medication Sig  . alendronate (FOSAMAX) 70 MG tablet Take  1 tablet (70 mg total) by mouth every 7 (seven) days. Take with a full glass of water on an empty stomach.  Marland Kitchen amLODipine (NORVASC) 10 MG tablet Take 1 tablet (10 mg total) by mouth daily.  Marland Kitchen aspirin EC 81 MG tablet Take 1 tablet (81 mg total) by mouth daily.  . B Complex-C (B-COMPLEX WITH VITAMIN C) tablet Take 1 tablet by mouth daily.  . Cholecalciferol (VITAMIN D3) 5000 UNITS CAPS Take 1 Can by mouth daily.  . citalopram (CELEXA) 20 MG tablet TAKE 1 TABLET (20 MG TOTAL) BY MOUTH DAILY.  Marland Kitchen desmopressin (DDAVP) 0.2 MG tablet Take 1 tablet (0.2 mg total) by mouth daily.  Marland Kitchen lisinopril (PRINIVIL,ZESTRIL) 40 MG tablet Take 1 tablet (40 mg total) by mouth daily.  . Lutein 20 MG TABS Take by mouth.  . magnesium oxide (MAG-OX) 400 MG tablet Take 1 tablet (400 mg total) by mouth daily.  . Multiple Vitamin (MULTIVITAMIN WITH MINERALS) TABS  tablet Take 1 tablet by mouth daily.  . Multiple Vitamins-Minerals (PRESERVISION AREDS PO) Take 1 tablet by mouth 2 (two) times daily.  Marland Kitchen omeprazole (PRILOSEC) 20 MG capsule Take 1 capsule (20 mg total) by mouth daily.  . sotalol (BETAPACE) 80 MG tablet Take 1 & 1/2 tablets (120 mg) by mouth twice daily   No facility-administered encounter medications on file as of 05/30/2017.     Activities of Daily Living In your present state of health, do you have any difficulty performing the following activities: 05/30/2017  Hearing? N  Vision? N  Difficulty concentrating or making decisions? N  Walking or climbing stairs? N  Dressing or bathing? N  Doing errands, shopping? N  Preparing Food and eating ? N  Using the Toilet? N  In the past six months, have you accidently leaked urine? N  Do you have problems with loss of bowel control? N  Managing your Medications? N  Managing your Finances? N  Housekeeping or managing your Housekeeping? N  Some recent data might be hidden    Patient Care Team: Ma Hillock, DO as PCP - General (Family Medicine) Bjorn Loser, MD as Consulting Physician (Urology) Deboraha Sprang, MD as Consulting Physician (Cardiology) Chesley Mires, MD as Consulting Physician (Pulmonary Disease) Melrose Nakayama, MD as Consulting Physician (Orthopedic Surgery)    Assessment:    Physical assessment deferred to PCP.  Exercise Activities and Dietary recommendations Current Exercise Habits: Home exercise routine, Type of exercise: walking, Time (Minutes): 30, Frequency (Times/Week): 3, Weekly Exercise (Minutes/Week): 90, Exercise limited by: None identified   Diet (meal preparation, eat out, water intake, caffeinated beverages, dairy products, fruits and vegetables): Drinks water and Sprite.   Eats in dining area at facility, heart health diet. Participates in group activities in community.    Goals    . Weight (lb) < 178 lb (80.7 kg)     Lose weight by increasing  activity.       Fall Risk Fall Risk  05/30/2017 02/11/2017 05/24/2016 08/25/2015  Falls in the past year? Yes Yes No Yes  Number falls in past yr: 1 1 - 1  Injury with Fall? Yes Yes - Yes  Risk Factor Category  - High Fall Risk - High Fall Risk  Risk for fall due to : - Impaired balance/gait History of fall(s) Impaired mobility  Follow up Falls prevention discussed Falls prevention discussed - Education provided;Falls prevention discussed;Falls evaluation completed   Depression Screen PHQ 2/9 Scores 05/30/2017 02/11/2017 05/24/2016 08/25/2015  PHQ - 2  Score 0 0 0 0  PHQ- 9 Score 0 1 - -  Exception Documentation - - Patient refusal -     Cognitive Function MMSE - Mini Mental State Exam 05/24/2016  Orientation to time 5  Orientation to Place 5  Registration 3  Attention/ Calculation 5  Recall 3  Language- name 2 objects 2  Language- repeat 1  Language- follow 3 step command 3  Language- read & follow direction 1  Write a sentence 1  Copy design 1  Total score 30       Ad8 score reviewed for issues:  Issues making decisions: no  Less interest in hobbies / activities: no  Repeats questions, stories (family complaining): no  Trouble using ordinary gadgets (microwave, computer, phone): no  Forgets the month or year: no  Mismanaging finances: no  Remembering appts: no  Daily problems with thinking and/or memory: no Ad8 score is=0     Immunization History  Administered Date(s) Administered  . Influenza Split 07/01/2013, 04/21/2014  . Influenza, High Dose Seasonal PF 04/12/2016  . Influenza, Seasonal, Injecte, Preservative Fre 04/22/2013  . Influenza-Unspecified 04/21/2014, 05/06/2015  . PPD Test 11/30/2012, 01/01/2013  . Pneumococcal Conjugate-13 05/18/2014  . Pneumococcal Polysaccharide-23 07/23/2011, 10/28/2012  . Tdap 07/22/2012, 10/28/2012   Declines Shingrix.   Screening Tests Health Maintenance  Topic Date Due  . DEXA SCAN  03/04/2019  . TETANUS/TDAP   10/29/2022  . INFLUENZA VACCINE  Completed  . PNA vac Low Risk Adult  Completed      Plan:    Bring a copy of your living will and/or healthcare power of attorney to your next office visit.  Continue doing brain stimulating activities (puzzles, reading, adult coloring books, staying active) to keep memory sharp.    I have personally reviewed and noted the following in the patient's chart:   . Medical and social history . Use of alcohol, tobacco or illicit drugs  . Current medications and supplements . Functional ability and status . Nutritional status . Physical activity . Advanced directives . List of other physicians . Hospitalizations, surgeries, and ER visits in previous 12 months . Vitals . Screenings to include cognitive, depression, and falls . Referrals and appointments  In addition, I have reviewed and discussed with patient certain preventive protocols, quality metrics, and best practice recommendations. A written personalized care plan for preventive services as well as general preventive health recommendations were provided to patient.     Gerilyn Nestle, RN  05/30/2017  PCP Notes: -Declines Shingrix -F/U with PCP 08/13/2017  Medical screening examination/treatment/procedure(s) were performed by non-physician practitioner and as supervising physician I was immediately available for consultation/collaboration.  I agree with above assessment and plan.  Electronically Signed by: Howard Pouch, DO [facility] [place]

## 2017-05-30 ENCOUNTER — Ambulatory Visit (INDEPENDENT_AMBULATORY_CARE_PROVIDER_SITE_OTHER): Payer: Medicare Other

## 2017-05-30 ENCOUNTER — Other Ambulatory Visit: Payer: Self-pay

## 2017-05-30 VITALS — BP 126/70 | HR 66 | Ht 68.0 in | Wt 188.8 lb

## 2017-05-30 DIAGNOSIS — Z0000 Encounter for general adult medical examination without abnormal findings: Secondary | ICD-10-CM

## 2017-05-30 NOTE — Patient Instructions (Addendum)
Bring a copy of your living will and/or healthcare power of attorney to your next office visit.  Continue doing brain stimulating activities (puzzles, reading, adult coloring books, staying active) to keep memory sharp.     Fall Prevention in the Home Falls can cause injuries. They can happen to people of all ages. There are many things you can do to make your home safe and to help prevent falls. What can I do on the outside of my home?  Regularly fix the edges of walkways and driveways and fix any cracks.  Remove anything that might make you trip as you walk through a door, such as a raised step or threshold.  Trim any bushes or trees on the path to your home.  Use bright outdoor lighting.  Clear any walking paths of anything that might make someone trip, such as rocks or tools.  Regularly check to see if handrails are loose or broken. Make sure that both sides of any steps have handrails.  Any raised decks and porches should have guardrails on the edges.  Have any leaves, snow, or ice cleared regularly.  Use sand or salt on walking paths during winter.  Clean up any spills in your garage right away. This includes oil or grease spills. What can I do in the bathroom?  Use night lights.  Install grab bars by the toilet and in the tub and shower. Do not use towel bars as grab bars.  Use non-skid mats or decals in the tub or shower.  If you need to sit down in the shower, use a plastic, non-slip stool.  Keep the floor dry. Clean up any water that spills on the floor as soon as it happens.  Remove soap buildup in the tub or shower regularly.  Attach bath mats securely with double-sided non-slip rug tape.  Do not have throw rugs and other things on the floor that can make you trip. What can I do in the bedroom?  Use night lights.  Make sure that you have a light by your bed that is easy to reach.  Do not use any sheets or blankets that are too big for your bed. They  should not hang down onto the floor.  Have a firm chair that has side arms. You can use this for support while you get dressed.  Do not have throw rugs and other things on the floor that can make you trip. What can I do in the kitchen?  Clean up any spills right away.  Avoid walking on wet floors.  Keep items that you use a lot in easy-to-reach places.  If you need to reach something above you, use a strong step stool that has a grab bar.  Keep electrical cords out of the way.  Do not use floor polish or wax that makes floors slippery. If you must use wax, use non-skid floor wax.  Do not have throw rugs and other things on the floor that can make you trip. What can I do with my stairs?  Do not leave any items on the stairs.  Make sure that there are handrails on both sides of the stairs and use them. Fix handrails that are broken or loose. Make sure that handrails are as long as the stairways.  Check any carpeting to make sure that it is firmly attached to the stairs. Fix any carpet that is loose or worn.  Avoid having throw rugs at the top or bottom of the stairs.  If you do have throw rugs, attach them to the floor with carpet tape.  Make sure that you have a light switch at the top of the stairs and the bottom of the stairs. If you do not have them, ask someone to add them for you. What else can I do to help prevent falls?  Wear shoes that: ? Do not have high heels. ? Have rubber bottoms. ? Are comfortable and fit you well. ? Are closed at the toe. Do not wear sandals.  If you use a stepladder: ? Make sure that it is fully opened. Do not climb a closed stepladder. ? Make sure that both sides of the stepladder are locked into place. ? Ask someone to hold it for you, if possible.  Clearly mark and make sure that you can see: ? Any grab bars or handrails. ? First and last steps. ? Where the edge of each step is.  Use tools that help you move around (mobility aids) if  they are needed. These include: ? Canes. ? Walkers. ? Scooters. ? Crutches.  Turn on the lights when you go into a dark area. Replace any light bulbs as soon as they burn out.  Set up your furniture so you have a clear path. Avoid moving your furniture around.  If any of your floors are uneven, fix them.  If there are any pets around you, be aware of where they are.  Review your medicines with your doctor. Some medicines can make you feel dizzy. This can increase your chance of falling. Ask your doctor what other things that you can do to help prevent falls. This information is not intended to replace advice given to you by your health care provider. Make sure you discuss any questions you have with your health care provider. Document Released: 05/04/2009 Document Revised: 12/14/2015 Document Reviewed: 08/12/2014 Elsevier Interactive Patient Education  2018 [employer] Maintenance, Female Adopting a healthy lifestyle and getting preventive care can go a long way to promote health and wellness. Talk with your health care provider about what schedule of regular examinations is right for you. This is a good chance for you to check in with your provider about disease prevention and staying healthy. In between checkups, there are plenty of things you can do on your own. Experts have done a lot of research about which lifestyle changes and preventive measures are most likely to keep you healthy. Ask your health care provider for more information. Weight and diet Eat a healthy diet  Be sure to include plenty of vegetables, fruits, low-fat dairy products, and lean protein.  Do not eat a lot of foods high in solid fats, added sugars, or salt.  Get regular exercise. This is one of the most important things you can do for your health. ? Most adults should exercise for at least 150 minutes each week. The exercise should increase your heart rate and make you sweat (moderate-intensity  exercise). ? Most adults should also do strengthening exercises at least twice a week. This is in addition to the moderate-intensity exercise.  Maintain a healthy weight  Body mass index (BMI) is a measurement that can be used to identify possible weight problems. It estimates body fat based on height and weight. Your health care provider can help determine your BMI and help you achieve or maintain a healthy weight.  For females 31 years of age and older: ? A BMI below 18.5 is considered underweight. ? A BMI  of 18.5 to 24.9 is normal. ? A BMI of 25 to 29.9 is considered overweight. ? A BMI of 30 and above is considered obese.  Watch levels of cholesterol and blood lipids  You should start having your blood tested for lipids and cholesterol at 81 years of age, then have this test every 5 years.  You may need to have your cholesterol levels checked more often if: ? Your lipid or cholesterol levels are high. ? You are older than 81 years of age. ? You are at high risk for heart disease.  Cancer screening Lung Cancer  Lung cancer screening is recommended for adults 88-6 years old who are at high risk for lung cancer because of a history of smoking.  A yearly low-dose CT scan of the lungs is recommended for people who: ? Currently smoke. ? Have quit within the past 15 years. ? Have at least a 30-pack-year history of smoking. A pack year is smoking an average of one pack of cigarettes a day for 1 year.  Yearly screening should continue until it has been 15 years since you quit.  Yearly screening should stop if you develop a health problem that would prevent you from having lung cancer treatment.  Breast Cancer  Practice breast self-awareness. This means understanding how your breasts normally appear and feel.  It also means doing regular breast self-exams. Let your health care provider know about any changes, no matter how small.  If you are in your 20s or 30s, you should have a  clinical breast exam (CBE) by a health care provider every 1-3 years as part of a regular health exam.  If you are 75 or older, have a CBE every year. Also consider having a breast X-ray (mammogram) every year.  If you have a family history of breast cancer, talk to your health care provider about genetic screening.  If you are at high risk for breast cancer, talk to your health care provider about having an MRI and a mammogram every year.  Breast cancer gene (BRCA) assessment is recommended for women who have family members with BRCA-related cancers. BRCA-related cancers include: ? Breast. ? Ovarian. ? Tubal. ? Peritoneal cancers.  Results of the assessment will determine the need for genetic counseling and BRCA1 and BRCA2 testing.  Cervical Cancer Your health care provider may recommend that you be screened regularly for cancer of the pelvic organs (ovaries, uterus, and vagina). This screening involves a pelvic examination, including checking for microscopic changes to the surface of your cervix (Pap test). You may be encouraged to have this screening done every 3 years, beginning at age 34.  For women ages 31-65, health care providers may recommend pelvic exams and Pap testing every 3 years, or they may recommend the Pap and pelvic exam, combined with testing for human papilloma virus (HPV), every 5 years. Some types of HPV increase your risk of cervical cancer. Testing for HPV may also be done on women of any age with unclear Pap test results.  Other health care providers may not recommend any screening for nonpregnant women who are considered low risk for pelvic cancer and who do not have symptoms. Ask your health care provider if a screening pelvic exam is right for you.  If you have had past treatment for cervical cancer or a condition that could lead to cancer, you need Pap tests and screening for cancer for at least 20 years after your treatment. If Pap tests have been discontinued,  your risk factors (such as having a new sexual partner) need to be reassessed to determine if screening should resume. Some women have medical problems that increase the chance of getting cervical cancer. In these cases, your health care provider may recommend more frequent screening and Pap tests.  Colorectal Cancer  This type of cancer can be detected and often prevented.  Routine colorectal cancer screening usually begins at 81 years of age and continues through 81 years of age.  Your health care provider may recommend screening at an earlier age if you have risk factors for colon cancer.  Your health care provider may also recommend using home test kits to check for hidden blood in the stool.  A small camera at the end of a tube can be used to examine your colon directly (sigmoidoscopy or colonoscopy). This is done to check for the earliest forms of colorectal cancer.  Routine screening usually begins at age 42.  Direct examination of the colon should be repeated every 5-10 years through 81 years of age. However, you may need to be screened more often if early forms of precancerous polyps or small growths are found.  Skin Cancer  Check your skin from head to toe regularly.  Tell your health care provider about any new moles or changes in moles, especially if there is a change in a mole's shape or color.  Also tell your health care provider if you have a mole that is larger than the size of a pencil eraser.  Always use sunscreen. Apply sunscreen liberally and repeatedly throughout the day.  Protect yourself by wearing long sleeves, pants, a wide-brimmed hat, and sunglasses whenever you are outside.  Heart disease, diabetes, and high blood pressure  High blood pressure causes heart disease and increases the risk of stroke. High blood pressure is more likely to develop in: ? People who have blood pressure in the high end of the normal range (130-139/85-89 mm Hg). ? People who are  overweight or obese. ? People who are African American.  If you are 45-1 years of age, have your blood pressure checked every 3-5 years. If you are 15 years of age or older, have your blood pressure checked every year. You should have your blood pressure measured twice-once when you are at a hospital or clinic, and once when you are not at a hospital or clinic. Record the average of the two measurements. To check your blood pressure when you are not at a hospital or clinic, you can use: ? An automated blood pressure machine at a pharmacy. ? A home blood pressure monitor.  If you are between 76 years and 30 years old, ask your health care provider if you should take aspirin to prevent strokes.  Have regular diabetes screenings. This involves taking a blood sample to check your fasting blood sugar level. ? If you are at a normal weight and have a low risk for diabetes, have this test once every three years after 81 years of age. ? If you are overweight and have a high risk for diabetes, consider being tested at a younger age or more often. Preventing infection Hepatitis B  If you have a higher risk for hepatitis B, you should be screened for this virus. You are considered at high risk for hepatitis B if: ? You were born in a country where hepatitis B is common. Ask your health care provider which countries are considered high risk. ? Your parents were born in a high-risk  country, and you have not been immunized against hepatitis B (hepatitis B vaccine). ? You have HIV or AIDS. ? You use needles to inject street drugs. ? You live with someone who has hepatitis B. ? You have had sex with someone who has hepatitis B. ? You get hemodialysis treatment. ? You take certain medicines for conditions, including cancer, organ transplantation, and autoimmune conditions.  Hepatitis C  Blood testing is recommended for: ? Everyone born from [birth year] through 1963. ? Anyone with known risk factors for  hepatitis C.  Sexually transmitted infections (STIs)  You should be screened for sexually transmitted infections (STIs) including gonorrhea and chlamydia if: ? You are sexually active and are younger than 81 years of age. ? You are older than 81 years of age and your health care provider tells you that you are at risk for this type of infection. ? Your sexual activity has changed since you were last screened and you are at an increased risk for chlamydia or gonorrhea. Ask your health care provider if you are at risk.  If you do not have HIV, but are at risk, it may be recommended that you take a prescription medicine daily to prevent HIV infection. This is called pre-exposure prophylaxis (PrEP). You are considered at risk if: ? You are sexually active and do not regularly use condoms or know the HIV status of your partner(s). ? You take drugs by injection. ? You are sexually active with a partner who has HIV.  Talk with your health care provider about whether you are at high risk of being infected with HIV. If you choose to begin PrEP, you should first be tested for HIV. You should then be tested every 3 months for as long as you are taking PrEP. Pregnancy  If you are premenopausal and you may become pregnant, ask your health care provider about preconception counseling.  If you may become pregnant, take 400 to 800 micrograms (mcg) of folic acid every day.  If you want to prevent pregnancy, talk to your health care provider about birth control (contraception). Osteoporosis and menopause  Osteoporosis is a disease in which the bones lose minerals and strength with aging. This can result in serious bone fractures. Your risk for osteoporosis can be identified using a bone density scan.  If you are 62 years of age or older, or if you are at risk for osteoporosis and fractures, ask your health care provider if you should be screened.  Ask your health care provider whether you should take a  calcium or vitamin D supplement to lower your risk for osteoporosis.  Menopause may have certain physical symptoms and risks.  Hormone replacement therapy may reduce some of these symptoms and risks. Talk to your health care provider about whether hormone replacement therapy is right for you. Follow these instructions at home:  Schedule regular health, dental, and eye exams.  Stay current with your immunizations.  Do not use any tobacco products including cigarettes, chewing tobacco, or electronic cigarettes.  If you are pregnant, do not drink alcohol.  If you are breastfeeding, limit how much and how often you drink alcohol.  Limit alcohol intake to no more than 1 drink per day for nonpregnant women. One drink equals 12 ounces of beer, 5 ounces of wine, or 1 ounces of hard liquor.  Do not use street drugs.  Do not share needles.  Ask your health care provider for help if you need support or information about quitting drugs.  Tell your health care provider if you often feel depressed.  Tell your health care provider if you have ever been abused or do not feel safe at home. This information is not intended to replace advice given to you by your health care provider. Make sure you discuss any questions you have with your health care provider. Document Released: 01/21/2011 Document Revised: 12/14/2015 Document Reviewed: 04/11/2015 Elsevier Interactive Patient Education  [employer].

## 2017-06-03 ENCOUNTER — Encounter (HOSPITAL_COMMUNITY): Payer: Self-pay | Admitting: Nurse Practitioner

## 2017-06-03 ENCOUNTER — Ambulatory Visit (HOSPITAL_COMMUNITY)
Admission: RE | Admit: 2017-06-03 | Discharge: 2017-06-03 | Disposition: A | Discharge status: Home / Self Care | Payer: Medicare Other | Source: Ambulatory Visit | Attending: Nurse Practitioner | Admitting: Nurse Practitioner

## 2017-06-03 VITALS — BP 124/76 | HR 66 | Ht 68.0 in | Wt 190.4 lb

## 2017-06-03 DIAGNOSIS — Z87891 Personal history of nicotine dependence: Secondary | ICD-10-CM | POA: Insufficient documentation

## 2017-06-03 DIAGNOSIS — H3530 Unspecified macular degeneration: Secondary | ICD-10-CM | POA: Diagnosis not present

## 2017-06-03 DIAGNOSIS — E039 Hypothyroidism, unspecified: Secondary | ICD-10-CM | POA: Diagnosis not present

## 2017-06-03 DIAGNOSIS — K219 Gastro-esophageal reflux disease without esophagitis: Secondary | ICD-10-CM | POA: Diagnosis not present

## 2017-06-03 DIAGNOSIS — Z7982 Long term (current) use of aspirin: Secondary | ICD-10-CM | POA: Diagnosis not present

## 2017-06-03 DIAGNOSIS — G4733 Obstructive sleep apnea (adult) (pediatric): Secondary | ICD-10-CM | POA: Insufficient documentation

## 2017-06-03 DIAGNOSIS — I4891 Unspecified atrial fibrillation: Secondary | ICD-10-CM | POA: Insufficient documentation

## 2017-06-03 DIAGNOSIS — I471 Supraventricular tachycardia: Secondary | ICD-10-CM | POA: Diagnosis not present

## 2017-06-03 DIAGNOSIS — E871 Hypo-osmolality and hyponatremia: Secondary | ICD-10-CM | POA: Diagnosis not present

## 2017-06-03 DIAGNOSIS — I739 Peripheral vascular disease, unspecified: Secondary | ICD-10-CM | POA: Insufficient documentation

## 2017-06-03 DIAGNOSIS — I10 Essential (primary) hypertension: Secondary | ICD-10-CM | POA: Insufficient documentation

## 2017-06-03 DIAGNOSIS — Z86010 Personal history of colonic polyps: Secondary | ICD-10-CM | POA: Diagnosis not present

## 2017-06-03 DIAGNOSIS — Z79899 Other long term (current) drug therapy: Secondary | ICD-10-CM | POA: Diagnosis not present

## 2017-06-03 DIAGNOSIS — I4510 Unspecified right bundle-branch block: Secondary | ICD-10-CM | POA: Diagnosis not present

## 2017-06-03 DIAGNOSIS — F329 Major depressive disorder, single episode, unspecified: Secondary | ICD-10-CM | POA: Diagnosis not present

## 2017-06-03 DIAGNOSIS — I480 Paroxysmal atrial fibrillation: Secondary | ICD-10-CM | POA: Diagnosis not present

## 2017-06-03 LAB — BASIC METABOLIC PANEL
Anion gap: 8 (ref 5–15)
BUN: 9 mg/dL (ref 6–20)
CO2: 24 mmol/L (ref 22–32)
Calcium: 10.2 mg/dL (ref 8.9–10.3)
Chloride: 96 mmol/L — ABNORMAL LOW (ref 101–111)
Creatinine, Ser: 0.55 mg/dL (ref 0.44–1.00)
GFR calc Af Amer: >60 mL/min (ref >60)
GFR calc non Af Amer: >60 mL/min (ref >60)
Glucose, Bld: 111 mg/dL — ABNORMAL HIGH (ref 65–99)
Potassium: 4 mmol/L (ref 3.5–5.1)
Sodium: 128 mmol/L — ABNORMAL LOW (ref 135–145)

## 2017-06-03 LAB — MAGNESIUM: Magnesium: 1.9 mg/dL (ref 1.7–2.4)

## 2017-06-03 NOTE — Progress Notes (Signed)
Primary Care Physician: Brittany Hillock, DO Referring Physician: Dr. Tora Duck [name] is a 83 y.o. female with a h/o remote afib that has been on sotalol for years. She is in the afib clinic for Sotalol monitoring. She reports no irregular heart beat. Paceart reports show no high rate episodes. She is not on anticoagulation at her request. Qtc is stable.  Today, she denies symptoms of palpitations, chest pain, shortness of breath, orthopnea, PND, lower extremity edema, dizziness, presyncope, syncope, or neurologic sequela. The patient is tolerating medications without difficulties and is otherwise without complaint today.   Past Medical History:  Diagnosis Date  . Ankle fracture 2016  . Atrial fibrillation ([place])   . Depression   . Elevated parathyroid hormone   . Fracture of orbital floor with routine healing   . GERD (gastroesophageal reflux disease)   . History of colon polyps    benign  . Hypercalcemia   . Hypertension   . Hyponatremia   . Hypothyroid   . Macular degeneration    wet in the right and dry in the left  . OSA on CPAP   . Osteoporosis   . Peripheral vascular disease ([place])   . PSVT (paroxysmal supraventricular tachycardia) ([place])   . Rotator cuff tear   . Sinus node dysfunction ([facility])    a. s/p MDT pacemaker  . Urinary incontinence   . Wrist fracture 2018   Past Surgical History:  Procedure Laterality Date  . BACK SURGERY    . CATARACT EXTRACTION    . CATARACT EXTRACTION W/ INTRAOCULAR LENS  IMPLANT, BILATERAL Bilateral   . COLONOSCOPY    . ESOPHAGOGASTRODUODENOSCOPY    . LAPAROSCOPIC CHOLECYSTECTOMY  08/11/1999  . LUMBAR DISC SURGERY  02/2014  . PACEMAKER PLACEMENT Right 2012   a. MDT dual chamber PPM implanted by Dr Caryl Comes   . SHOULDER ARTHROSCOPY W/ ROTATOR CUFF REPAIR Right 08/30/2014   WITH MINI-OPEN ROTATOR CUFF REPAIR AND SUBACROMIAL DECOMPRESSION  . VAGINAL HYSTERECTOMY  1970    Current Outpatient Medications  Medication Sig Dispense Refill    . alendronate (FOSAMAX) 70 MG tablet Take 1 tablet (70 mg total) by mouth every 7 (seven) days. Take with a full glass of water on an empty stomach. 4 tablet 11  . amLODipine (NORVASC) 10 MG tablet Take 1 tablet (10 mg total) by mouth daily. 90 tablet 1  . aspirin EC 81 MG tablet Take 1 tablet (81 mg total) by mouth daily.    . B Complex-C (B-COMPLEX WITH VITAMIN C) tablet Take 1 tablet by mouth daily.    . Cholecalciferol (VITAMIN D3) 5000 UNITS CAPS Take 1 Can by mouth daily.    . citalopram (CELEXA) 20 MG tablet TAKE 1 TABLET (20 MG TOTAL) BY MOUTH DAILY. 90 tablet 1  . desmopressin (DDAVP) 0.2 MG tablet Take 1 tablet (0.2 mg total) by mouth daily. 90 tablet 1  . lisinopril (PRINIVIL,ZESTRIL) 40 MG tablet Take 1 tablet (40 mg total) by mouth daily. 90 tablet 1  . Lutein 20 MG TABS Take by mouth.    . magnesium oxide (MAG-OX) 400 MG tablet Take 1 tablet (400 mg total) by mouth daily. 30 tablet 0  . Multiple Vitamin (MULTIVITAMIN WITH MINERALS) TABS tablet Take 1 tablet by mouth daily.    . Multiple Vitamins-Minerals (PRESERVISION AREDS PO) Take 1 tablet by mouth 2 (two) times daily.    Marland Kitchen omeprazole (PRILOSEC) 20 MG capsule Take 1 capsule (20 mg total) by mouth daily. Brittany [name]  capsule 1  . sotalol (BETAPACE) 80 MG tablet Take 1 & 1/2 tablets (120 mg) by mouth twice daily 270 tablet 3   No current facility-administered medications for this encounter.     Allergies  Allergen Reactions  . Adhesive [Tape] Swelling and Rash  . Benzalkonium Chloride Rash  . Neosporin [Neomycin-Polymyxin-Gramicidin] Swelling and Rash    Social History   Socioeconomic History  . Marital status: Married    Spouse name: Not on file  . Number of children: 4  . Years of education: 60  . Highest education level: Not on file  Social Needs  . Financial resource strain: Not on file  . Food insecurity - worry: Not on file  . Food insecurity - inability: Not on file  . Transportation needs - medical: Not on file  .  Transportation needs - non-medical: Not on file  Occupational History  . Occupation: Clinical research associate     Comment: retired  Tobacco Use  . Smoking status: Former Smoker    Packs/day: 1.00    Years: 45.00    Pack years: 45.00    Types: Cigarettes  . Smokeless tobacco: Never Used  . Tobacco comment: quit smoking in 1992  Substance and Sexual Activity  . Alcohol use: Yes    Alcohol/week: 16.8 oz    Types: 28 Glasses of wine per week    Comment: 08/30/2014 "16oz wine q night"  . Drug use: No  . Sexual activity: Yes    Birth control/protection: Surgical  Other Topics Concern  . Not on file  Social History Narrative   Ms. Hemminger lives with her husband at [employer]. They are retired and recently relocated to [facility] from [place]. She has 4 grown children.    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    Family History  Problem Relation Age of Onset  . Colon cancer Mother        colon  . Cancer Father        unknown type  . Stroke Sister   . Skin cancer Brother   . Heart disease Brother   . Breast cancer Paternal Aunt   . Asthma Brother     ROS- All systems are reviewed and negative except as per the HPI above  Physical Exam: Vitals:   06/03/17 1418  BP: 124/76  Pulse: 66  Weight: 190 lb 6.4 oz (86.4 kg)  Height: 5\' 8"  (1.727 m)   Wt Readings from Last 3 Encounters:  06/03/17 190 lb 6.4 oz (86.4 kg)  05/30/17 188 lb 12.8 oz (85.6 kg)  03/10/17 189 lb 12 oz (86.1 kg)    Labs: Lab Results  Component Value Date   NA 128 (L) 06/03/2017   K 4.0 06/03/2017   CL 96 (L) 06/03/2017   CO2 24 06/03/2017   GLUCOSE 111 (H) 06/03/2017   BUN 9 06/03/2017   CREATININE 0.55 06/03/2017   CALCIUM 10.2 06/03/2017   MG 1.9 06/03/2017   Lab Results  Component Value Date   INR 1.15 04/15/2015   Lab Results  Component Value Date   CHOL 252 (H) 08/14/2016   HDL 72.60 08/14/2016   LDLCALC 158 (H) 08/14/2016   TRIG 107.0 08/14/2016      GEN- The patient is well appearing, alert and oriented x 3 today.   Head- normocephalic, atraumatic Eyes-  Sclera clear, conjunctiva pink Ears- hearing intact Oropharynx- clear Neck- supple, no JVP Lymph- no cervical lymphadenopathy Lungs- Clear to  ausculation bilaterally, normal work of breathing Heart- Regular rate and rhythm, no murmurs, rubs or gallops, PMI not laterally displaced GI- soft, NT, ND, + BS Extremities- no clubbing, cyanosis, or edema MS- no significant deformity or atrophy Skin- no rash or lesion Psych- euthymic mood, full affect Neuro- strength and sensation are intact  EKG atrial -paced at 66 bpm, pr int 288 ms, qrs int 92 ms, qtc 459 ms, IRBBB  Epic records reviewed Paceart reports reviewed    Assessment and Plan: 1.Afib Appears to be quiet Continue on sotalol 80 mg bid Qtc stable Continue on 81 mg ASA as pt defers anticoagulation, discussed risk vrs benefit of anticoagulation, she still defers Bmet/mag today  2. HTN Stable  Remote check 11/14 Has f/u with Chanetta Marshall, NP in 6 months afib clinic as needed  Butch Penny [facility], [facility] [address] [place], [place] [address] [phone]

## 2017-06-04 ENCOUNTER — Telehealth: Payer: Self-pay | Admitting: Family Medicine

## 2017-06-04 ENCOUNTER — Ambulatory Visit (INDEPENDENT_AMBULATORY_CARE_PROVIDER_SITE_OTHER): Payer: Medicare Other | Admitting: *Deleted

## 2017-06-04 DIAGNOSIS — I495 Sick sinus syndrome: Secondary | ICD-10-CM

## 2017-06-04 NOTE — Telephone Encounter (Signed)
Please call pt: - her cardiologist office forwarded lab results to [place] with rather low sodium chloride levels. She has had hyponatremia (low sodium) in the past, but her current level is rather low.  Please find out if she has been dizzy, confusion, headache etc with those levels? - if symptoms present would want her to make an appt to be seen as soon as possible - . If not symptomatic would want to follow up with her within a few weeks to repeat labs. Make Certain she is taking in adequate nutrition, drink Gatorade and V8 juice to raise sodium chloride levels.

## 2017-06-04 NOTE — Progress Notes (Signed)
Remote pacemaker transmission.   

## 2017-06-05 LAB — CUP PACEART REMOTE DEVICE CHECK
Battery Impedance: 1138 Ohm
Battery Remaining Longevity: 50 mo
Battery Voltage: 2.77 V
Brady Statistic AP VP Percent: 0 %
Brady Statistic AP VS Percent: 97 %
Brady Statistic AS VP Percent: 0 %
Brady Statistic AS VS Percent: 2 %
Date Time Interrogation Session: 20181114142233
Implantable Lead Implant Date: 20121213
Implantable Lead Implant Date: 20121213
Implantable Lead Location: 753859
Implantable Lead Location: 753860
Implantable Lead Model: 4076
Implantable Lead Model: 4076
Implantable Lead Serial Number: 835025
Implantable Lead Serial Number: 869269
Implantable Pulse Generator Implant Date: 20121213
Lead Channel Impedance Value: 409 Ohm
Lead Channel Impedance Value: 533 Ohm
Lead Channel Pacing Threshold Amplitude: 0.5 V
Lead Channel Pacing Threshold Amplitude: 0.625 V
Lead Channel Pacing Threshold Pulse Width: 0.4 ms
Lead Channel Pacing Threshold Pulse Width: 0.4 ms
Lead Channel Setting Pacing Amplitude: 2 V
Lead Channel Setting Pacing Amplitude: 2.5 V
Lead Channel Setting Pacing Pulse Width: 0.4 ms
Lead Channel Setting Sensing Sensitivity: 5.6 mV

## 2017-06-05 NOTE — Telephone Encounter (Signed)
Pt advised and voiced understanding. She did state that she has had some dizziness the last day or so. Apt has been made for 06/06/17 at 2:00pm

## 2017-06-05 NOTE — Telephone Encounter (Signed)
Left message for patient to return call.

## 2017-06-06 ENCOUNTER — Telehealth: Payer: Self-pay | Admitting: *Deleted

## 2017-06-06 ENCOUNTER — Ambulatory Visit (INDEPENDENT_AMBULATORY_CARE_PROVIDER_SITE_OTHER): Payer: Medicare Other | Admitting: Family Medicine

## 2017-06-06 ENCOUNTER — Encounter: Payer: Self-pay | Admitting: Family Medicine

## 2017-06-06 ENCOUNTER — Encounter: Payer: Self-pay | Admitting: Cardiology

## 2017-06-06 VITALS — BP 114/71 | HR 65 | Temp 97.6°F | Resp 20 | Wt 189.5 lb

## 2017-06-06 DIAGNOSIS — E871 Hypo-osmolality and hyponatremia: Secondary | ICD-10-CM | POA: Diagnosis not present

## 2017-06-06 DIAGNOSIS — N3941 Urge incontinence: Secondary | ICD-10-CM | POA: Diagnosis not present

## 2017-06-06 DIAGNOSIS — R42 Dizziness and giddiness: Secondary | ICD-10-CM | POA: Insufficient documentation

## 2017-06-06 DIAGNOSIS — N3281 Overactive bladder: Secondary | ICD-10-CM | POA: Insufficient documentation

## 2017-06-06 LAB — BASIC METABOLIC PANEL
BUN: 9 mg/dL (ref 6–23)
CO2: 31 mEq/L (ref 19–32)
Calcium: 10.5 mg/dL (ref 8.4–10.5)
Chloride: 102 mEq/L (ref 96–112)
Creatinine, Ser: 0.55 mg/dL (ref 0.40–1.20)
GFR: 112.72 mL/min (ref >60.00)
Glucose, Bld: 101 mg/dL — ABNORMAL HIGH (ref 70–99)
Potassium: 4.4 mEq/L (ref 3.5–5.1)
Sodium: 137 mEq/L (ref 135–145)

## 2017-06-06 MED ORDER — OXYBUTYNIN CHLORIDE ER 5 MG PO TB24: 5.0000 mg | ORAL_TABLET | Freq: Every day | ORAL | 0 refills | Status: DC

## 2017-06-06 MED ORDER — MIRABEGRON ER 25 MG PO TB24: 25.0000 mg | ORAL_TABLET | Freq: Every day | ORAL | 0 refills | Status: DC

## 2017-06-06 NOTE — Telephone Encounter (Signed)
Patient states she has never tried detrol she states she doesn't have any Rx coverage at this time. Told patient if we call this in she will have to check with pharmacy to see if affordable

## 2017-06-06 NOTE — Telephone Encounter (Signed)
Patient called and states she did not pick up the Myrbetriq she said it is 424.00 dollars for 30 tabs and she cant afford this . She states she will need something affordable. Please advise

## 2017-06-06 NOTE — Patient Instructions (Signed)
Start Myrbetriq 25 mg a day, every day.   Stop desmopressin use , I am worried this is the cause of your hyponatremia.   If you get dizzy, you can use over the counter salt tabs. We do not want the salt/sodium level to jump in either direction fast.     Hyponatremia Hyponatremia is when the amount of salt (sodium) in your blood is too low. When sodium levels are low, your cells absorb extra water and they swell. The swelling happens throughout the body, but it mostly affects the brain. What are the causes? This condition may be caused by:  Heart, kidney, or liver problems.  Thyroid problems.  Adrenal gland problems.  Metabolic conditions, such as syndrome of inappropriate antidiuretic hormone (SIADH).  Severe vomiting and diarrhea.  Certain medicines or illegal drugs.  Dehydration.  Drinking too much water.  Eating a diet that is low in sodium.  Large burns on your body.  Sweating.  What increases the risk? This condition is more likely to develop in people who:  Have long-term (chronic) kidney disease.  Have heart failure.  Have a medical condition that causes frequent or excessive diarrhea.  Have metabolic conditions, such as Addison disease or SIADH.  Take certain medicines that affect the sodium and fluid balance in the blood. Some of these medicine types include: ? Diuretics. ? NSAIDs. ? Some opioid pain medicines. ? Some antidepressants. ? Some seizure prevention medicines.  What are the signs or symptoms? Symptoms of this condition include:  Nausea and vomiting.  Confusion.  Lethargy.  Agitation.  Headache.  Seizures.  Unconsciousness.  Appetite loss.  Muscle weakness and cramping.  Feeling weak or light-headed.  Having a rapid heart rate.  Fainting, in severe cases.  How is this diagnosed? This condition is diagnosed with a medical history and physical exam. You will also have other tests, including:  Blood tests.  Urine  tests.  How is this treated? Treatment for this condition depends on the cause. Treatment may include:  Fluids given through an IV tube that is inserted into one of your veins.  Medicines to correct the sodium imbalance. If medicines are causing the condition, the medicines will need to be adjusted.  Limiting water or fluid intake to get the correct sodium balance.  Follow these instructions at home:  Take medicines only as directed by your health care provider. Many medicines can make this condition worse. Talk with your health care provider about any medicines that you are currently taking.  Carefully follow a recommended diet as directed by your health care provider.  Carefully follow instructions from your health care provider about fluid restrictions.  Keep all follow-up visits as directed by your health care provider. This is important.  Do not drink alcohol. Contact a health care provider if:  You develop worsening nausea, fatigue, headache, confusion, or weakness.  Your symptoms go away and then return.  You have problems following the recommended diet. Get help right away if:  You have a seizure.  You faint.  You have ongoing diarrhea or vomiting. This information is not intended to replace advice given to you by your health care provider. Make sure you discuss any questions you have with your health care provider. Document Released: 06/28/2002 Document Revised: 12/14/2015 Document Reviewed: 07/28/2014 Elsevier Interactive Patient Education  [employer].

## 2017-06-06 NOTE — Telephone Encounter (Signed)
1. Make sure it is the generic. Although more expensive, I have not heard of it being this expensive by any patient. 2. May need prior auth for coverage... She has tried and can not take  vesicare and desmopressin.  3. Has she tried Detrol/oxybutynin

## 2017-06-06 NOTE — Progress Notes (Signed)
Brittany [name] , Sep 24, 1933, 83 y.o., female MRN: [id number] Patient Care Team    Relationship Specialty Notifications Start End  Ma Hillock, DO PCP - General Family Medicine  02/24/15   Bjorn Loser, MD Consulting Physician Urology  05/24/16   Deboraha Sprang, MD Consulting Physician Cardiology  05/24/16   Chesley Mires, MD Consulting Physician Pulmonary Disease  05/24/16   Melrose Nakayama, MD Consulting Physician Orthopedic Surgery  05/30/17     Chief Complaint  Patient presents with  . abnormal lab results     Subjective: Pt presents for an OV to discuss recent labs collected at cardiology that resulted with rather significant hyponatremia of 128 and low chloride of 96. She has had hyponatremia in the past and it was felt to be secondary to her HCTZ and desmopressin use. She has restarted the desmopressin and uses intermittently (from urology). HCTZ has been discontinued for some time. She reports she has been dizzy over the last few days. She denies headaches, confusion, muscle cramps or syncope. Her TSH has been normal. She is on celexa 20 mg that has been consistent over many years. She denies nausea, vomit, diarrhea.   Depression screen [facility] 2/9 05/30/2017 02/11/2017 05/24/2016 08/25/2015  Decreased Interest 0 0 0 0  Down, Depressed, Hopeless 0 0 0 0  PHQ - 2 Score 0 0 0 0  Altered sleeping 0 0 - -  Tired, decreased energy 0 0 - -  Change in appetite 0 0 - -  Feeling bad or failure about yourself  0 0 - -  Trouble concentrating 0 1 - -  Moving slowly or fidgety/restless 0 0 - -  Suicidal thoughts 0 0 - -  PHQ-9 Score 0 1 - -  Difficult doing work/chores Not difficult at all - - -    Allergies  Allergen Reactions  . Adhesive [Tape] Swelling and Rash  . Benzalkonium Chloride Rash  . Neosporin [Neomycin-Polymyxin-Gramicidin] Swelling and Rash   Social History   Tobacco Use  . Smoking status: Former Smoker    Packs/day: 1.00    Years: 45.00    Pack years: 45.00   Types: Cigarettes  . Smokeless tobacco: Never Used  . Tobacco comment: quit smoking in 1992  Substance Use Topics  . Alcohol use: Yes    Alcohol/week: 16.8 oz    Types: 28 Glasses of wine per week    Comment: 08/30/2014 "16oz wine q night"   Past Medical History:  Diagnosis Date  . Ankle fracture 2016  . Atrial fibrillation ([place])   . Depression   . Elevated parathyroid hormone   . Fracture of orbital floor with routine healing   . GERD (gastroesophageal reflux disease)   . History of colon polyps    benign  . Hypercalcemia   . Hypertension   . Hyponatremia   . Hypothyroid   . Macular degeneration    wet in the right and dry in the left  . OSA on CPAP   . Osteoporosis   . Peripheral vascular disease ([place])   . PSVT (paroxysmal supraventricular tachycardia) ([place])   . Rotator cuff tear   . Sinus node dysfunction ([facility])    a. s/p MDT pacemaker  . Urinary incontinence   . Wrist fracture 2018   Past Surgical History:  Procedure Laterality Date  . BACK SURGERY    . CATARACT EXTRACTION    . CATARACT EXTRACTION W/ INTRAOCULAR LENS  IMPLANT, BILATERAL Bilateral   . COLONOSCOPY    .  ESOPHAGOGASTRODUODENOSCOPY    . LAPAROSCOPIC CHOLECYSTECTOMY  08/11/1999  . LUMBAR DISC SURGERY  02/2014  . OPEN REDUCTION INTERNAL FIXATION (ORIF) LEFT ANKLE FRACTURE Left 04/13/2015   Performed by Meredith Pel, MD at [facility]  . PACEMAKER PLACEMENT Right 2012   a. MDT dual chamber PPM implanted by Dr Caryl Comes   . SHOULDER ARTHROSCOPY W/ ROTATOR CUFF REPAIR Right 08/30/2014   WITH MINI-OPEN ROTATOR CUFF REPAIR AND SUBACROMIAL DECOMPRESSION  . SHOULDER ARTHROSCOPY WITH MINI-OPEN ROTATOR CUFF REPAIR AND SUBACROMIAL DECOMPRESSION Right 08/30/2014   Performed by Meredith Pel, MD at [place]  . VAGINAL HYSTERECTOMY  1970   Family History  Problem Relation Age of Onset  . Colon cancer Mother        colon  . Cancer Father        unknown type  . Stroke Sister   . Skin cancer Brother   . Heart disease  Brother   . Breast cancer Paternal Aunt   . Asthma Brother    Allergies as of 06/06/2017      Reactions   Adhesive [tape] Swelling, Rash   Benzalkonium Chloride Rash   Neosporin [neomycin-polymyxin-gramicidin] Swelling, Rash      Medication List        Accurate as of 06/06/17  2:25 PM. Always use your most recent med list.          alendronate 70 MG tablet Commonly known as:  FOSAMAX Take 1 tablet (70 mg total) by mouth every 7 (seven) days. Take with a full glass of water on an empty stomach.   amLODipine 10 MG tablet Commonly known as:  NORVASC Take 1 tablet (10 mg total) by mouth daily.   aspirin EC 81 MG tablet Take 1 tablet (81 mg total) by mouth daily.   B-complex with vitamin C tablet Take 1 tablet by mouth daily.   citalopram 20 MG tablet Commonly known as:  CELEXA TAKE 1 TABLET (20 MG TOTAL) BY MOUTH DAILY.   desmopressin 0.2 MG tablet Commonly known as:  DDAVP Take 1 tablet (0.2 mg total) by mouth daily.   lisinopril 40 MG tablet Commonly known as:  PRINIVIL,ZESTRIL Take 1 tablet (40 mg total) by mouth daily.   Lutein 20 MG Tabs Take by mouth.   magnesium oxide 400 MG tablet Commonly known as:  MAG-OX Take 1 tablet (400 mg total) by mouth daily.   mirabegron ER 25 MG Tb24 tablet Commonly known as:  MYRBETRIQ Take 1 tablet (25 mg total) daily by mouth.   multivitamin with minerals Tabs tablet Take 1 tablet by mouth daily.   omeprazole 20 MG capsule Commonly known as:  PRILOSEC Take 1 capsule (20 mg total) by mouth daily.   PRESERVISION AREDS PO Take 1 tablet by mouth 2 (two) times daily.   sotalol 80 MG tablet Commonly known as:  BETAPACE Take 1 & 1/2 tablets (120 mg) by mouth twice daily   Vitamin D3 5000 units Caps Take 1 Can by mouth daily.       All past medical history, surgical history, allergies, family history, immunizations andmedications were updated in the EMR today and reviewed under the history and medication portions  of their EMR.     ROS: Negative, with the exception of above mentioned in HPI   Objective:  BP 114/71 (BP Location: Right Arm, Patient Position: Sitting, Cuff Size: Normal)   Pulse 65   Temp 97.6 F (36.4 C)   Resp 20   Wt 189 lb  8 oz (86 kg)   LMP 07/22/1968   SpO2 99%   BMI 28.81 kg/m  Body mass index is 28.81 kg/m. Gen: Afebrile. No acute distress. Nontoxic in appearance, well developed, well nourished. Very pleasant caucasian female.  HENT: AT. [place]. MMM Eyes:Pupils Equal Round Reactive to light, Extraocular movements intact,  Conjunctiva without redness, discharge or icterus. CV: RRR no murmur, trace edema Chest: CTAB, no wheeze or crackles. Good air movement, normal resp effort.  Abd: Soft. NTND. BS present. no Masses palpated. No rebound or guarding.   Neuro:  Normal gait. PERLA. EOMi. Alert. Oriented x3   No exam data present No results found. No results found for this or any previous visit (from the past 24 hour(s)).  Assessment/Plan: Sheleen Conchas is a 83 y.o. female present for OV for  Hyponatremia/dizziness/OAB with incontinence - symptomatic pt. Has been taking desmopressin again, She uses mostly when needing for trips or events and can not run to bathroom frequently. She wears a pad daily. Discussed the use of desmopressin is more than likely the cause of her fluctuations in sodium we have seen in the past. Recommend DC this medication and pt is agreeable today. Will try myrbetriq 25 mg QD and see her back in 4 weeks, can increase at that time if needed.  - repeat BMP today. If symptomatic over the weekend pt should either be seen urgently or if mild symptoms try salt tab.AVS on hyponatremia and emergent signs/sympyoms provided to her today.  - Basic Metabolic Panel - could consider Celexa as cause, however that medication has been a constant in her regimen and desmopressin has been used intermittently.  - f/u 4 weeks   Reviewed expectations re: course of current  medical issues.  Discussed self-management of symptoms.  Outlined signs and symptoms indicating need for more acute intervention.  Patient verbalized understanding and all questions were answered.  Patient received an After-Visit Summary.    Orders Placed This Encounter  Procedures  . Basic Metabolic Panel (BMET)     Note is dictated utilizing voice recognition software. Although note has been proof read prior to signing, occasional typographical errors still can be missed. If any questions arise, please do not hesitate to call for verification.   electronically signed by:  Howard Pouch, DO  [place]

## 2017-06-16 ENCOUNTER — Ambulatory Visit: Payer: Medicare Other | Admitting: Pulmonary Disease

## 2017-06-26 ENCOUNTER — Ambulatory Visit: Payer: Medicare Other | Admitting: Pulmonary Disease

## 2017-06-27 ENCOUNTER — Encounter: Payer: Self-pay | Admitting: Pulmonary Disease

## 2017-06-27 ENCOUNTER — Ambulatory Visit (INDEPENDENT_AMBULATORY_CARE_PROVIDER_SITE_OTHER): Payer: Medicare Other | Admitting: Pulmonary Disease

## 2017-06-27 VITALS — BP 116/68 | HR 66 | Ht 68.0 in | Wt 190.8 lb

## 2017-06-27 DIAGNOSIS — I2510 Atherosclerotic heart disease of native coronary artery without angina pectoris: Secondary | ICD-10-CM | POA: Diagnosis not present

## 2017-06-27 DIAGNOSIS — Z9989 Dependence on other enabling machines and devices: Secondary | ICD-10-CM | POA: Diagnosis not present

## 2017-06-27 DIAGNOSIS — G4733 Obstructive sleep apnea (adult) (pediatric): Secondary | ICD-10-CM | POA: Diagnosis not present

## 2017-06-27 NOTE — Progress Notes (Signed)
Current Outpatient Medications on File Prior to Visit  Medication Sig  . alendronate (FOSAMAX) 70 MG tablet Take 1 tablet (70 mg total) by mouth every 7 (seven) days. Take with a full glass of water on an empty stomach.  Marland Kitchen amLODipine (NORVASC) 10 MG tablet Take 1 tablet (10 mg total) by mouth daily.  Marland Kitchen aspirin EC 81 MG tablet Take 1 tablet (81 mg total) by mouth daily.  . B Complex-C (B-COMPLEX WITH VITAMIN C) tablet Take 1 tablet by mouth daily.  . Cholecalciferol (VITAMIN D3) 5000 UNITS CAPS Take 1 Can by mouth daily.  . citalopram (CELEXA) 20 MG tablet TAKE 1 TABLET (20 MG TOTAL) BY MOUTH DAILY.  Marland Kitchen lisinopril (PRINIVIL,ZESTRIL) 40 MG tablet Take 1 tablet (40 mg total) by mouth daily.  . Lutein 20 MG TABS Take by mouth.  . magnesium oxide (MAG-OX) 400 MG tablet Take 1 tablet (400 mg total) by mouth daily.  . Multiple Vitamin (MULTIVITAMIN WITH MINERALS) TABS tablet Take 1 tablet by mouth daily.  . Multiple Vitamins-Minerals (PRESERVISION AREDS PO) Take 1 tablet by mouth 2 (two) times daily.  Marland Kitchen omeprazole (PRILOSEC) 20 MG capsule Take 1 capsule (20 mg total) by mouth daily. (Patient taking differently: Take 20 mg daily as needed by mouth. )  . oxybutynin (DITROPAN-XL) 5 MG 24 hr tablet Take 1 tablet (5 mg total) at bedtime by mouth.  . sotalol (BETAPACE) 80 MG tablet Take 1 & 1/2 tablets (120 mg) by mouth twice daily   No current facility-administered medications on file prior to visit.      Chief Complaint  Patient presents with  . Follow-up    Pt doing well overall with cpap machine.     Sleep tests PSG 08/13/12 ([place]) >> AHI 24, SaO2 low 72%, CPAP 8 PSG 07/03/14 >> AHI 24.4, SaO2 low 76%, PLMI 81.8. CPAP 8 cm H2O >> AHI 0, +S.  CPAP 03/29/17 to 06/26/17 >> used on 90 of 90 nights with average 9 hrs 10 min.  Average AHI 1.5 with CPAP 8 cm H2O.  Past medical history A fib, HTN, Depression, Hypothyroidism, GERD  Past surgical history, Family history, Social history, Allergies  reviewed  Vital Signs BP 116/68 (BP Location: Left Arm, Cuff Size: Normal)   Pulse 66   Ht 5\' 8"  (1.727 m)   Wt 190 lb 12.8 oz (86.5 kg)   LMP 07/22/1968   SpO2 98%   BMI 29.01 kg/m   History of Present Illness Brittany [name] is a 83 y.o. female with OSA.  She has been doing well with CPAP.  No issues with mask fit.  Uses nasal mask.  Gets about 10 hrs sleep per night.  Wakes up twice to use bathroom.  Feels well rested during the day.  Physical Exam  General - pleasant Eyes - pupils reactive, wears glasses ENT - no sinus tenderness, no oral exudate, no LAN Cardiac - regular, no murmur Chest - no wheeze, rales Abd - soft, non tender Ext - no edema Skin - no rashes Neuro - normal strength Psych - normal mood   Assessment/Plan  Obstructive sleep apnea. - she is compliant with CPAP and reports benefit from therapy   Patient Instructions  Follow up in 1 year    Chesley Mires, MD [facility] Pager:  [phone] 06/27/2017, 1:44 PM

## 2017-06-27 NOTE — Patient Instructions (Signed)
Follow up in 1 year.

## 2017-07-01 DIAGNOSIS — H353133 Nonexudative age-related macular degeneration, bilateral, advanced atrophic without subfoveal involvement: Secondary | ICD-10-CM | POA: Diagnosis not present

## 2017-07-01 DIAGNOSIS — H40013 Open angle with borderline findings, low risk, bilateral: Secondary | ICD-10-CM | POA: Diagnosis not present

## 2017-07-01 DIAGNOSIS — G4733 Obstructive sleep apnea (adult) (pediatric): Secondary | ICD-10-CM | POA: Diagnosis not present

## 2017-07-01 DIAGNOSIS — Z961 Presence of intraocular lens: Secondary | ICD-10-CM | POA: Diagnosis not present

## 2017-07-01 DIAGNOSIS — H04123 Dry eye syndrome of bilateral lacrimal glands: Secondary | ICD-10-CM | POA: Diagnosis not present

## 2017-07-02 ENCOUNTER — Telehealth: Payer: Self-pay | Admitting: *Deleted

## 2017-07-02 NOTE — Telephone Encounter (Signed)
Pt states she has an appt for an ingrown toenail procedure 07/07/2017 and would like the recovery time frame. 07/02/2017-I informed pt, 4-6 weeks with 20 minutes soaks 2 times daily with antibiotic ointment like neosporin, polysporin, or bacitracin for 4-6 weeks and then required end result to stop soaks is the area should have a dry hard scab.

## 2017-07-07 ENCOUNTER — Encounter: Payer: Self-pay | Admitting: Podiatry

## 2017-07-07 ENCOUNTER — Ambulatory Visit (INDEPENDENT_AMBULATORY_CARE_PROVIDER_SITE_OTHER): Payer: Medicare Other | Admitting: Podiatry

## 2017-07-07 VITALS — BP 137/76 | HR 83 | Resp 16

## 2017-07-07 DIAGNOSIS — I2510 Atherosclerotic heart disease of native coronary artery without angina pectoris: Secondary | ICD-10-CM | POA: Diagnosis not present

## 2017-07-07 DIAGNOSIS — L600 Ingrowing nail: Secondary | ICD-10-CM

## 2017-07-07 NOTE — Progress Notes (Signed)
   Subjective:    Patient ID: Brittany [name], female    DOB: 05-18-1934, 83 y.o.   MRN: [id number]  HPI    Review of Systems  All other systems reviewed and are negative.      Objective:   Physical Exam        Assessment & Plan:

## 2017-07-07 NOTE — Patient Instructions (Signed)

## 2017-07-08 DIAGNOSIS — L218 Other seborrheic dermatitis: Secondary | ICD-10-CM | POA: Diagnosis not present

## 2017-07-08 DIAGNOSIS — Z08 Encounter for follow-up examination after completed treatment for malignant neoplasm: Secondary | ICD-10-CM | POA: Diagnosis not present

## 2017-07-08 DIAGNOSIS — Z85828 Personal history of other malignant neoplasm of skin: Secondary | ICD-10-CM | POA: Diagnosis not present

## 2017-07-08 DIAGNOSIS — D485 Neoplasm of uncertain behavior of skin: Secondary | ICD-10-CM | POA: Diagnosis not present

## 2017-07-08 DIAGNOSIS — C4491 Basal cell carcinoma of skin, unspecified: Secondary | ICD-10-CM | POA: Diagnosis not present

## 2017-07-08 DIAGNOSIS — L570 Actinic keratosis: Secondary | ICD-10-CM | POA: Diagnosis not present

## 2017-07-08 DIAGNOSIS — L821 Other seborrheic keratosis: Secondary | ICD-10-CM | POA: Diagnosis not present

## 2017-07-08 NOTE — Progress Notes (Signed)
Subjective:   Patient ID: Brittany [name], female   DOB: 83 y.o.   MRN: [id number]   HPI Patient presents stating she has a severely damaged painful right hallux nail and she wants it removed permanently.  Patient states she is tried trimming it she is tried putting pads on it and soaks without relief it is been going on for fairly long period of time   Review of Systems  All other systems reviewed and are negative.       Objective:  Physical Exam  Constitutional: She appears well-developed and well-nourished.  Cardiovascular: Intact distal pulses.  Pulmonary/Chest: Effort normal.  Musculoskeletal: Normal range of motion.  Neurological: She is alert.  Skin: Skin is warm.  Nursing note and vitals reviewed.   Neurovascular status intact muscle strength adequate range of motion within normal limits with patient found to have a severely damaged abnormally shaped and is digging into the adjacent tissue.  Right hallux nail that is thickened it is localized and there is found to be good digital perfusion    Assessment:  Significant structural nail disease right hallux with pain upon dorsal compression     Plan:  H&P condition reviewed and discussed treatment options.  Patient wants nail removal understanding risk and wants it done permanently and today I infiltrated the right hallux 60 mg Xylocaine Marcaine mixture removed the nail exposed matrix and applied phenol 5 applications 30 seconds followed by alcohol lavage and sterile dressing.  Gave instructions on soaks and reappoint

## 2017-07-25 ENCOUNTER — Ambulatory Visit: Payer: Medicare Other | Admitting: Podiatry

## 2017-08-13 ENCOUNTER — Ambulatory Visit (INDEPENDENT_AMBULATORY_CARE_PROVIDER_SITE_OTHER): Payer: Medicare Other | Admitting: Family Medicine

## 2017-08-13 ENCOUNTER — Encounter: Payer: Self-pay | Admitting: Family Medicine

## 2017-08-13 VITALS — BP 105/71 | HR 85 | Temp 98.2°F | Wt 190.8 lb

## 2017-08-13 DIAGNOSIS — E785 Hyperlipidemia, unspecified: Secondary | ICD-10-CM | POA: Diagnosis not present

## 2017-08-13 DIAGNOSIS — R42 Dizziness and giddiness: Secondary | ICD-10-CM

## 2017-08-13 DIAGNOSIS — I495 Sick sinus syndrome: Secondary | ICD-10-CM | POA: Diagnosis not present

## 2017-08-13 DIAGNOSIS — I480 Paroxysmal atrial fibrillation: Secondary | ICD-10-CM | POA: Diagnosis not present

## 2017-08-13 DIAGNOSIS — I10 Essential (primary) hypertension: Secondary | ICD-10-CM

## 2017-08-13 DIAGNOSIS — F329 Major depressive disorder, single episode, unspecified: Secondary | ICD-10-CM | POA: Diagnosis not present

## 2017-08-13 DIAGNOSIS — F419 Anxiety disorder, unspecified: Secondary | ICD-10-CM

## 2017-08-13 DIAGNOSIS — E210 Primary hyperparathyroidism: Secondary | ICD-10-CM | POA: Diagnosis not present

## 2017-08-13 DIAGNOSIS — F32A Depression, unspecified: Secondary | ICD-10-CM

## 2017-08-13 LAB — CBC WITH DIFFERENTIAL/PLATELET
Basophils Absolute: 0 10*3/uL (ref 0.0–0.1)
Basophils Relative: 0.5 % (ref 0.0–3.0)
Eosinophils Absolute: 0.2 10*3/uL (ref 0.0–0.7)
Eosinophils Relative: 3.7 % (ref 0.0–5.0)
HCT: 47.1 % — ABNORMAL HIGH (ref 36.0–46.0)
Hemoglobin: 15.8 g/dL — ABNORMAL HIGH (ref 12.0–15.0)
Lymphocytes Relative: 25.1 % (ref 12.0–46.0)
Lymphs Abs: 1.2 10*3/uL (ref 0.7–4.0)
MCHC: 33.4 g/dL (ref 30.0–36.0)
MCV: 96.7 fl (ref 78.0–100.0)
Monocytes Absolute: 0.6 10*3/uL (ref 0.1–1.0)
Monocytes Relative: 11.7 % (ref 3.0–12.0)
Neutro Abs: 2.8 10*3/uL (ref 1.4–7.7)
Neutrophils Relative %: 59 % (ref 43.0–77.0)
Platelets: 253 10*3/uL (ref 150.0–400.0)
RBC: 4.87 Mil/uL (ref 3.87–5.11)
RDW: 13.7 % (ref 11.5–15.5)
WBC: 4.8 10*3/uL (ref 4.0–10.5)

## 2017-08-13 MED ORDER — CITALOPRAM HYDROBROMIDE 20 MG PO TABS: ORAL_TABLET | ORAL | 1 refills | Status: DC

## 2017-08-13 MED ORDER — AMLODIPINE BESYLATE 10 MG PO TABS: 10.0000 mg | ORAL_TABLET | Freq: Every day | ORAL | 1 refills | Status: DC

## 2017-08-13 NOTE — Patient Instructions (Addendum)
Lisinopril 1/2 tab daily. Rest of your medications stay the same.  Your blood pressure is lower than usual and is likely the cause of your dizziness. Monitor BP over next few days, if still low or dizzy please call in for guidance. If higher than 135/85 routinely please call in.  If normal and dizziness resolved, follow up in 6 months.--> but call in and let us know so we can refill the lisinopril at lower dose.    Please help [place] help you:  We are honored you have chosen [facility] for your [facility] home. Below you will find basic instructions that you may need to access in the future. Please help [place] help you by reading the instructions, which cover many of the frequent questions we experience.   Prescription refills and request:  -In order to allow more efficient response time, please call your pharmacy for all refills. They will forward the request electronically to [place]. This allows for the quickest possible response. Request left on a nurse line can take longer to refill, since these are checked as time allows between office patients and other phone calls.  - refill request can take up to 3-5 working days to complete.  - If request is sent electronically and request is appropiate, it is usually completed in 1-2 business days.  - all patients will need to be seen routinely for all chronic medical conditions requiring prescription medications (see follow-up below). If you are overdue for follow up on your condition, you will be asked to make an appointment and we will call in enough medication to cover you until your appointment (up to 30 days).  - all controlled substances will require a face to face visit to request/refill.  - if you desire your prescriptions to go through a new pharmacy, and have an active script at original pharmacy, you will need to call your pharmacy and have scripts transferred to new pharmacy. This is completed between the pharmacy locations and not by your provider.     Results: If any images or labs were ordered, it can take up to 1 week to get results depending on the test ordered and the lab/facility running and resulting the test. - Normal or stable results, which do not need further discussion, may be released to your mychart immediately with attached note to you. A call may not be generated for normal results. Please make certain to sign up for mychart. If you have questions on how to activate your mychart you can call the front office.  - If your results need further discussion, our office will attempt to contact you via phone, and if unable to reach you after 2 attempts, we will release your abnormal result to your mychart with instructions.  - All results will be automatically released in mychart after 1 week.  - Your provider will provide you with explanation and instruction on all relevant material in your results. Please keep in mind, results and labs may appear confusing or abnormal to the untrained eye, but it does not mean they are actually abnormal for you personally. If you have any questions about your results that are not covered, or you desire more detailed explanation than what was provided, you should make an appointment with your provider to do so.   Our office handles many outgoing and incoming calls daily. If we have not contacted you within 1 week about your results, please check your mychart to see if there is a message first  and if not, then contact our office.  In helping with this matter, you help decrease call volume, and therefore allow [place] to be able to respond to patients needs more efficiently.   Acute office visits (sick visit):  An acute visit is intended for a new problem and are scheduled in shorter time slots to allow schedule openings for patients with new problems. This is the appropriate visit to discuss a new problem. In order to provide you with excellent quality medical care with proper time for you to explain your  problem, have an exam and receive treatment with instructions, these appointments should be limited to one new problem per visit. If you experience a new problem, in which you desire to be addressed, please make an acute office visit, we save openings on the schedule to accommodate you. Please do not save your new problem for any other type of visit, let us take care of it properly and quickly for you.   Follow up visits:  Depending on your condition(s) your provider will need to see you routinely in order to provide you with quality care and prescribe medication(s). Most chronic conditions (Example: hypertension, Diabetes, depression/anxiety... etc), require visits a couple times a year. Your provider will instruct you on proper follow up for your personal medical conditions and history. Please make certain to make follow up appointments for your condition as instructed. Failing to do so could result in lapse in your medication treatment/refills. If you request a refill, and are overdue to be seen on a condition, we will always provide you with a 30 day script (once) to allow you time to schedule.    Medicare wellness (well visit): - we have a wonderful Nurse Maudie Mercury), that will meet with you and provide you will yearly medicare wellness visits. These visits should occur yearly (can not be scheduled less than 1 calendar year apart) and cover preventive health, immunizations, advance directives and screenings you are entitled to yearly through your medicare benefits. Do not miss out on your entitled benefits, this is when medicare will pay for these benefits to be ordered for you.  These are strongly encouraged by your provider and is the appropriate type of visit to make certain you are up to date with all preventive health benefits. If you have not had your medicare wellness exam in the last 12 months, please make certain to schedule one by calling the office and schedule your medicare wellness with Maudie Mercury as  soon as possible.   Yearly physical (well visit):  - Adults are recommended to be seen yearly for physicals. Check with your insurance and date of your last physical, most insurances require one calendar year between physicals. Physicals include all preventive health topics, screenings, medical exam and labs that are appropriate for gender/age and history. You may have fasting labs needed at this visit. This is a well visit (not a sick visit), new problems should not be covered during this visit (see acute visit).  - Pediatric patients are seen more frequently when they are younger. Your provider will advise you on well child visit timing that is appropriate for your their age. - This is not a medicare wellness visit. Medicare wellness exams do not have an exam portion to the visit. Some medicare companies allow for a physical, some do not allow a yearly physical. If your medicare allows a yearly physical you can schedule the medicare wellness with our nurse Maudie Mercury and have your physical with your provider after, on  the same day. Please check with insurance for your full benefits.   Late Policy/No Shows:  - all new patients should arrive 15-30 minutes earlier than appointment to allow [place] time  to  obtain all personal demographics,  insurance information and for you to complete office paperwork. - All established patients should arrive 10-15 minutes earlier than appointment time to update all information and be checked in .  - In our best efforts to run on time, if you are late for your appointment you will be asked to either reschedule or if able, we will work you back into the schedule. There will be a wait time to work you back in the schedule,  depending on availability.  - If you are unable to make it to your appointment as scheduled, please call 24 hours ahead of time to allow [place] to fill the time slot with someone else who needs to be seen. If you do not cancel your appointment ahead of time, you may be  charged a no show fee.

## 2017-08-13 NOTE — Progress Notes (Signed)
Brittany [name] , [birth year], 84 y.o., female MRN: [id number] Patient Care Team    Relationship Specialty Notifications Start End  Ma Hillock, DO PCP - General Family Medicine  02/24/15   Bjorn Loser, MD Consulting Physician Urology  05/24/16   Deboraha Sprang, MD Consulting Physician Cardiology  05/24/16   Chesley Mires, MD Consulting Physician Pulmonary Disease  05/24/16   Melrose Nakayama, MD Consulting Physician Orthopedic Surgery  05/30/17   Noralyn Pick, MD Referring Physician Ophthalmology  08/13/17   Wallene Huh, DPM Consulting Physician Podiatry  08/13/17     Chief Complaint  Patient presents with  . Follow-up    f/u for HTN/depression     Subjective:  Hypertension/CAD/A.FIB/Sinus node dysfunctionPacemaker-Medtronic/Hyperlipidemia:  Patient reports compliance with current regimen sotalol 120 mg twice a day, lisinopril 40 mg daily, amlodipine 10 mg daily. Patient denies chest pain, shortness of breath, dizziness or lower extremity edema. She has noticed some dizziness when changing positions the last month. She has not been checking her BP. Pt takes a daily baby ASA. Pt is prescribed statin. BMP: 06/06/2017 normal CBC: 08/14/2016 WNL Lipid panel: 08/14/2016 mildly elevated LDL, great HDL Diet: Low sodium Exercise:  RF: HTN, HD brother  Anxiety and depression: She reports compliance with citalopram 20 mg daily. She feels medication is effective and would like refills today.  Hypercalcemia/primary hyperparathyroidism:  12/12/2016: 2030, calcium 10.6, PTH 78. 06/06/2017: Calcium 10.5  Depression screen [facility] 2/9 08/13/2017 05/30/2017 02/11/2017 05/24/2016 08/25/2015  Decreased Interest 0 0 0 0 0  Down, Depressed, Hopeless 0 0 0 0 0  PHQ - 2 Score 0 0 0 0 0  Altered sleeping 0 0 0 - -  Tired, decreased energy 0 0 0 - -  Change in appetite 0 0 0 - -  Feeling bad or failure about yourself  0 0 0 - -  Trouble concentrating 0 0 1 - -  Moving slowly or  fidgety/restless 0 0 0 - -  Suicidal thoughts 0 0 0 - -  PHQ-9 Score 0 0 1 - -  Difficult doing work/chores Not difficult at all Not difficult at all - - -    Allergies  Allergen Reactions  . Adhesive [Tape] Swelling and Rash  . Benzalkonium Chloride Rash  . Neosporin [Neomycin-Polymyxin-Gramicidin] Swelling and Rash   Social History   Tobacco Use  . Smoking status: Former Smoker    Packs/day: 1.00    Years: 45.00    Pack years: 45.00    Types: Cigarettes  . Smokeless tobacco: Never Used  . Tobacco comment: quit smoking in 1992  Substance Use Topics  . Alcohol use: Yes    Alcohol/week: 16.8 oz    Types: 28 Glasses of wine per week    Comment: 08/30/2014 "16oz wine q night"   Past Medical History:  Diagnosis Date  . Ankle fracture 2016  . Atrial fibrillation ([place])   . Depression   . Elevated parathyroid hormone   . Fracture of orbital floor with routine healing   . GERD (gastroesophageal reflux disease)   . History of colon polyps    benign  . Hypercalcemia   . Hypertension   . Hyponatremia   . Hypothyroid   . Macular degeneration    wet in the right and dry in the left  . OSA on CPAP   . Osteoporosis   . Peripheral vascular disease ([place])   . PSVT (paroxysmal supraventricular tachycardia) ([place])   . Rotator cuff tear   .  Sinus node dysfunction (HCC)    a. s/p MDT pacemaker  . Urinary incontinence   . Wrist fracture 2018   Past Surgical History:  Procedure Laterality Date  . BACK SURGERY    . CATARACT EXTRACTION    . CATARACT EXTRACTION W/ INTRAOCULAR LENS  IMPLANT, BILATERAL Bilateral   . COLONOSCOPY    . ESOPHAGOGASTRODUODENOSCOPY    . LAPAROSCOPIC CHOLECYSTECTOMY  08/11/1999  . LUMBAR DISC SURGERY  02/2014  . ORIF ANKLE FRACTURE Left 04/13/2015   Procedure: OPEN REDUCTION INTERNAL FIXATION (ORIF) LEFT ANKLE FRACTURE;  Surgeon: Meredith Pel, MD;  Location: [facility];  Service: Orthopedics;  Laterality: Left;  . PACEMAKER PLACEMENT Right 2012   a. MDT dual  chamber PPM implanted by Dr Caryl Comes   . SHOULDER ARTHROSCOPY W/ ROTATOR CUFF REPAIR Right 08/30/2014   WITH MINI-OPEN ROTATOR CUFF REPAIR AND SUBACROMIAL DECOMPRESSION  . SHOULDER ARTHROSCOPY WITH ROTATOR CUFF REPAIR AND SUBACROMIAL DECOMPRESSION Right 08/30/2014   Procedure: SHOULDER ARTHROSCOPY WITH MINI-OPEN ROTATOR CUFF REPAIR AND SUBACROMIAL DECOMPRESSION;  Surgeon: Meredith Pel, MD;  Location: [facility];  Service: Orthopedics;  Laterality: Right;  RIGHT SHOULDER DIAGNOSTIC OPERATIVE ARTHROSCOPY, SUBACROMIAL DECOMPRESSION, MINI-OPEN ROTATOR CUFF REPAIR.  Marland Kitchen VAGINAL HYSTERECTOMY  1970   Family History  Problem Relation Age of Onset  . Colon cancer Mother        colon  . Cancer Father        unknown type  . Stroke Sister   . Skin cancer Brother   . Heart disease Brother   . Breast cancer Paternal Aunt   . Asthma Brother    Allergies as of 08/13/2017      Reactions   Adhesive [tape] Swelling, Rash   Benzalkonium Chloride Rash   Neosporin [neomycin-polymyxin-gramicidin] Swelling, Rash      Medication List        Accurate as of 08/13/17  5:15 PM. Always use your most recent med list.          alendronate 70 MG tablet Commonly known as:  FOSAMAX Take 1 tablet (70 mg total) by mouth every 7 (seven) days. Take with a full glass of water on an empty stomach.   amLODipine 10 MG tablet Commonly known as:  NORVASC Take 1 tablet (10 mg total) by mouth daily.   aspirin EC 81 MG tablet Take 1 tablet (81 mg total) by mouth daily.   B-complex with vitamin C tablet Take 1 tablet by mouth daily.   citalopram 20 MG tablet Commonly known as:  CELEXA TAKE 1 TABLET (20 MG TOTAL) BY MOUTH DAILY.   lisinopril 40 MG tablet Commonly known as:  PRINIVIL,ZESTRIL Take 1 tablet (40 mg total) by mouth daily.   Lutein 20 MG Tabs Take by mouth.   magnesium oxide 400 MG tablet Commonly known as:  MAG-OX Take 1 tablet (400 mg total) by mouth daily.   multivitamin with minerals Tabs  tablet Take 1 tablet by mouth daily.   omeprazole 20 MG capsule Commonly known as:  PRILOSEC Take 1 capsule (20 mg total) by mouth daily.   oxybutynin 5 MG 24 hr tablet Commonly known as:  DITROPAN-XL Take 1 tablet (5 mg total) at bedtime by mouth.   PRESERVISION AREDS PO Take 1 tablet by mouth 2 (two) times daily.   sotalol 80 MG tablet Commonly known as:  BETAPACE Take 1 & 1/2 tablets (120 mg) by mouth twice daily   Vitamin D3 5000 units Caps Take 1 Can by mouth daily.  All past medical history, surgical history, allergies, family history, immunizations andmedications were updated in the EMR today and reviewed under the history and medication portions of their EMR.     ROS: Negative, with the exception of above mentioned in HPI  Objective:  BP 105/71 (BP Location: Left Arm, Patient Position: Sitting, Cuff Size: Normal)   Pulse 85   Temp 98.2 F (36.8 C) (Oral)   Wt 190 lb 12.8 oz (86.5 kg)   LMP 07/22/1968   SpO2 97%   BMI 29.01 kg/m  Body mass index is 29.01 kg/m. Gen: Afebrile. No acute distress. Nontoxic in appearance, well developed, well nourished.  HENT: AT. [place]. MMM, no oral lesions. Eyes:Pupils Equal Round Reactive to light, Extraocular movements intact,  Conjunctiva without redness, discharge or icterus. Neck/lymp/endocrine: Supple,no lymphadenopathy, no thyromegaly CV: RRR no murmur, no edema Chest: CTAB, no wheeze or crackles. Good air movement, normal resp effort.  Abd: Soft. NTND. BS present. No Masses palpated. No rebound or guarding.  Neuro:  Normal gait. PERLA. EOMi. Alert. Oriented x3  Psych: Normal affect, dress and demeanor. Normal speech. Normal thought content and judgment.  No exam data present No results found. Results for orders placed or performed in visit on 08/13/17 (from the past 24 hour(s))  CBC w/Diff     Status: Abnormal   Collection Time: 08/13/17  2:07 PM  Result Value Ref Range   WBC 4.8 4.0 - 10.5 K/uL   RBC 4.87 3.87 -  5.11 Mil/uL   Hemoglobin 15.8 (H) 12.0 - 15.0 g/dL   HCT 47.1 (H) 36.0 - 46.0 %   MCV 96.7 78.0 - 100.0 fl   MCHC 33.4 30.0 - 36.0 g/dL   RDW 13.7 11.5 - 15.5 %   Platelets 253.0 150.0 - 400.0 K/uL   Neutrophils Relative % 59.0 43.0 - 77.0 %   Lymphocytes Relative 25.1 12.0 - 46.0 %   Monocytes Relative 11.7 3.0 - 12.0 %   Eosinophils Relative 3.7 0.0 - 5.0 %   Basophils Relative 0.5 0.0 - 3.0 %   Neutro Abs 2.8 1.4 - 7.7 K/uL   Lymphs Abs 1.2 0.7 - 4.0 K/uL   Monocytes Absolute 0.6 0.1 - 1.0 K/uL   Eosinophils Absolute 0.2 0.0 - 0.7 K/uL   Basophils Absolute 0.0 0.0 - 0.1 K/uL    Assessment/Plan: Adrienna Karis is a 84 y.o. female present for OV for  Essential hypertension, benign/Paroxysmal atrial fibrillation (HCC)/sinus node dysfunction/hyperlipidemia - Pressures are little soft today. This is abnormal for her. Her dizziness is mostly with switching positions, which would suggest possible orthostatic hypotension. Patient was instructed to decrease lisinopril to 20 mg daily and monitor. Rest of medications unchanged and refill. - CBC w/Diff BMP: 06/06/2017 normal CBC: 08/14/2016 WNL Lipid panel: 08/14/2016 mildly elevated LDL, great HDL Follow-up in 6 months at decreased dose is effective. If not effective, blood pressures raise above goal we'll need to be seen sooner. Primary hyperparathyroidism ([place]) Asymptomatic. Continue to monitor every 6 months. - PTH, Intact and Calcium  Anxiety and depression Stable. Refills on Celexa provided.  Dizziness Struggled with intermittent dizziness for many years. This is new over the last month. Appears orthostatic. Her blood pressure is lower today than any prior collection. No decrease lisinopril to 20 mg, and she will continue to monitor blood pressure and dizziness. She will call back within 2 weeks, she is doing well with normal blood pressures and no dizziness refills were provided for lisinopril 20 mg daily for 6 months.  If not doing  well she will need to make an appointment to have further workup.   Reviewed expectations re: course of current medical issues.  Discussed self-management of symptoms.  Outlined signs and symptoms indicating need for more acute intervention.  Patient verbalized understanding and all questions were answered.  Patient received an After-Visit Summary.    Orders Placed This Encounter  Procedures  . PTH, Intact and Calcium  . CBC w/Diff     Note is dictated utilizing voice recognition software. Although note has been proof read prior to signing, occasional typographical errors still can be missed. If any questions arise, please do not hesitate to call for verification.   electronically signed by:  Howard Pouch, DO  [place]

## 2017-08-14 LAB — EXTRA LAV TOP TUBE

## 2017-08-14 LAB — PTH, INTACT AND CALCIUM
Calcium: 10.5 mg/dL — ABNORMAL HIGH (ref 8.6–10.4)
PTH: 130 pg/mL — ABNORMAL HIGH (ref 14–64)

## 2017-08-15 ENCOUNTER — Telehealth: Payer: Self-pay | Admitting: Family Medicine

## 2017-08-15 DIAGNOSIS — E213 Hyperparathyroidism, unspecified: Secondary | ICD-10-CM

## 2017-08-15 DIAGNOSIS — R42 Dizziness and giddiness: Secondary | ICD-10-CM

## 2017-08-15 NOTE — Telephone Encounter (Signed)
Spoke with patient reviewed lab results and instructions. Patient verbalized understanding. 

## 2017-08-15 NOTE — Telephone Encounter (Signed)
Please call pt: - her blood work results indicate her parathyroid hormone is increasing. It was mildly elevated last check and has doubled since then. Her calcium has remained the same (just barely elevated) but she is also on fosamax now that can decrease elevated calcium in the blood.  I know she has voiced desire not to see a specialist for this condition, however now that her PTH is increasing I do feel that is the best decision for her and have placed referral.

## 2017-08-22 ENCOUNTER — Telehealth: Payer: Self-pay | Admitting: Family Medicine

## 2017-08-22 ENCOUNTER — Encounter: Payer: Self-pay | Admitting: *Deleted

## 2017-08-22 DIAGNOSIS — I10 Essential (primary) hypertension: Secondary | ICD-10-CM

## 2017-08-22 DIAGNOSIS — I495 Sick sinus syndrome: Secondary | ICD-10-CM

## 2017-08-22 MED ORDER — LISINOPRIL 20 MG PO TABS: 20.0000 mg | ORAL_TABLET | Freq: Every day | ORAL | 1 refills | Status: DC

## 2017-08-22 NOTE — Telephone Encounter (Signed)
Lisinopril 20 mg QD prescribed for 6 mos, called into [facility]. F/U 6 mos.

## 2017-08-22 NOTE — Telephone Encounter (Signed)
Copied from [place] [id number]. Topic: Quick Communication - Rx Refill/Question >> Aug 22, 2017 11:49 AM Brittany [name] wrote: Pt called to state that her bp medicine is doing okay her bp is lower end of 135

## 2017-08-29 ENCOUNTER — Other Ambulatory Visit: Payer: Self-pay | Admitting: Internal Medicine

## 2017-09-03 ENCOUNTER — Ambulatory Visit (INDEPENDENT_AMBULATORY_CARE_PROVIDER_SITE_OTHER): Payer: Medicare Other | Admitting: *Deleted

## 2017-09-03 DIAGNOSIS — I495 Sick sinus syndrome: Secondary | ICD-10-CM | POA: Diagnosis not present

## 2017-09-03 NOTE — Progress Notes (Signed)
Remote pacemaker transmission.   

## 2017-09-04 ENCOUNTER — Encounter: Payer: Self-pay | Admitting: Cardiology

## 2017-09-13 LAB — CUP PACEART REMOTE DEVICE CHECK
Battery Impedance: 1249 Ohm
Battery Remaining Longevity: 47 mo
Battery Voltage: 2.77 V
Brady Statistic AP VP Percent: 0 %
Brady Statistic AP VS Percent: 98 %
Brady Statistic AS VP Percent: 0 %
Brady Statistic AS VS Percent: 2 %
Date Time Interrogation Session: 20190213160327
Implantable Lead Implant Date: 20121213
Implantable Lead Implant Date: 20121213
Implantable Lead Location: 753859
Implantable Lead Location: 753860
Implantable Lead Model: 4076
Implantable Lead Model: 4076
Implantable Lead Serial Number: 835025
Implantable Lead Serial Number: 869269
Implantable Pulse Generator Implant Date: 20121213
Lead Channel Impedance Value: 415 Ohm
Lead Channel Impedance Value: 559 Ohm
Lead Channel Pacing Threshold Amplitude: 0.5 V
Lead Channel Pacing Threshold Amplitude: 0.625 V
Lead Channel Pacing Threshold Pulse Width: 0.4 ms
Lead Channel Pacing Threshold Pulse Width: 0.4 ms
Lead Channel Setting Pacing Amplitude: 2 V
Lead Channel Setting Pacing Amplitude: 2.5 V
Lead Channel Setting Pacing Pulse Width: 0.4 ms
Lead Channel Setting Sensing Sensitivity: 5.6 mV

## 2017-09-29 ENCOUNTER — Encounter: Payer: Self-pay | Admitting: Internal Medicine

## 2017-09-29 ENCOUNTER — Ambulatory Visit (INDEPENDENT_AMBULATORY_CARE_PROVIDER_SITE_OTHER): Payer: Medicare Other | Admitting: Internal Medicine

## 2017-09-29 DIAGNOSIS — E559 Vitamin D deficiency, unspecified: Secondary | ICD-10-CM

## 2017-09-29 DIAGNOSIS — E213 Hyperparathyroidism, unspecified: Secondary | ICD-10-CM

## 2017-09-29 DIAGNOSIS — M810 Age-related osteoporosis without current pathological fracture: Secondary | ICD-10-CM | POA: Diagnosis not present

## 2017-09-29 NOTE — Progress Notes (Signed)
Patient ID: Brittany [name], female   DOB: [birth year], 84 y.o.   MRN: [id number]    HPI  Brittany [name] is a 84 y.o.-year-old female, referred by her PCP, Dr. Raoul Pitch, for evaluation and management of hypercalcemia/hyperparathyroidism.  Pt was dx with hypercalcemia around 2010 in [place]. She saw an endocrinologist then >> no intervention suggested then (especially as she was against surgical intervention). She saw him again in 1 year and no intervention suggested again.  I reviewed pt's pertinent labs: Lab Results  Component Value Date   PTH 130 (H) 08/13/2017   PTH 78 (H) 02/11/2017   CALCIUM 10.5 (H) 08/13/2017   CALCIUM 10.5 06/06/2017   CALCIUM 10.2 06/03/2017   CALCIUM 10.6 (H) 02/11/2017   CALCIUM 10.8 (H) 01/10/2017   CALCIUM 11.0 (H) 12/03/2016   CALCIUM 10.5 (H) 08/14/2016   CALCIUM 10.0 04/12/2016   CALCIUM 10.2 11/22/2015   CALCIUM 10.0 08/18/2015   CALCIUM 10.2 08/18/2015   Of note, she is on a B complex - last dose last night.  I reviewed pt's DEXA scans - osteoporosis, with the lowest bone mineral density was at the level of the forearm: Date L1-L4 T score FN T score 33% distal Radius Ultra distal radius  03/03/2017 [facility] n/a RFN: -0.5 LFN: -0.4  -2.7 -4.2  03/02/2013 n/a n/a     She is on Fosamax 70 mg weekly  - started 6 mo ago.  She had a L and R medial malleolar fracture in 03/2015 and many years ago, respectively, for which she had ORIF. Also, 2 L wrist fx - many years ago and 2018, respectively.  No h/o kidney stones.  No h/o CKD. Last BUN/Cr: Lab Results  Component Value Date   BUN 9 06/06/2017   BUN 9 06/03/2017   CREATININE 0.55 06/06/2017   CREATININE 0.55 06/03/2017   Pt is not on HCTZ.    She is on magnesium oxide 400 mg daily.  No h/o vitamin D deficiency. Reviewed vit D levels: Lab Results  Component Value Date   VD25OH 30 02/11/2017   VD25OH 26.27 (L) 08/16/2016   VD25OH 26.40 (L) 08/26/2014   VD25OH 42.09 12/22/2013   VD25OH 37 02/23/2013   Pt is on vitamin D 5000 units daily.  Pt does not have a FH of hypercalcemia, pituitary tumors, thyroid cancer, or osteoporosis.   Pt. also has a history of HTN, pacemaker, frequent urination.  ROS: Constitutional: no weight gain/loss, no fatigue, no subjective hyperthermia/hypothermia, + excessive urination and nocturia Eyes: no blurry vision, no xerophthalmia ENT: no sore throat, no nodules palpated in throat, no dysphagia/odynophagia, no hoarseness Cardiovascular: no CP/SOB/palpitations/leg swelling Respiratory: no cough/SOB Gastrointestinal: no N/V/D/C Musculoskeletal: no muscle/joint aches Skin: no rashes, + easy bruising Neurological: no tremors/numbness/tingling/dizziness Psychiatric: no depression/anxiety  Past Medical History:  Diagnosis Date  . Ankle fracture 2016  . Atrial fibrillation ([place])   . Depression   . Elevated parathyroid hormone   . Fracture of orbital floor with routine healing   . GERD (gastroesophageal reflux disease)   . History of colon polyps    benign  . Hypercalcemia   . Hypertension   . Hyponatremia   . Hypothyroid   . Macular degeneration    wet in the right and dry in the left  . OSA on CPAP   . Osteoporosis   . Peripheral vascular disease ([place])   . PSVT (paroxysmal supraventricular tachycardia) ([place])   . Rotator cuff tear   . Sinus node dysfunction ([facility])  a. s/p MDT pacemaker  . Urinary incontinence   . Wrist fracture 2018   Past Surgical History:  Procedure Laterality Date  . BACK SURGERY    . CATARACT EXTRACTION    . CATARACT EXTRACTION W/ INTRAOCULAR LENS  IMPLANT, BILATERAL Bilateral   . COLONOSCOPY    . ESOPHAGOGASTRODUODENOSCOPY    . LAPAROSCOPIC CHOLECYSTECTOMY  08/11/1999  . LUMBAR DISC SURGERY  02/2014  . ORIF ANKLE FRACTURE Left 04/13/2015   Procedure: OPEN REDUCTION INTERNAL FIXATION (ORIF) LEFT ANKLE FRACTURE;  Surgeon: Meredith Pel, MD;  Location: [facility];  Service: Orthopedics;   Laterality: Left;  . PACEMAKER PLACEMENT Right 2012   a. MDT dual chamber PPM implanted by Dr Caryl Comes   . SHOULDER ARTHROSCOPY W/ ROTATOR CUFF REPAIR Right 08/30/2014   WITH MINI-OPEN ROTATOR CUFF REPAIR AND SUBACROMIAL DECOMPRESSION  . SHOULDER ARTHROSCOPY WITH ROTATOR CUFF REPAIR AND SUBACROMIAL DECOMPRESSION Right 08/30/2014   Procedure: SHOULDER ARTHROSCOPY WITH MINI-OPEN ROTATOR CUFF REPAIR AND SUBACROMIAL DECOMPRESSION;  Surgeon: Meredith Pel, MD;  Location: [facility];  Service: Orthopedics;  Laterality: Right;  RIGHT SHOULDER DIAGNOSTIC OPERATIVE ARTHROSCOPY, SUBACROMIAL DECOMPRESSION, MINI-OPEN ROTATOR CUFF REPAIR.  Marland Kitchen VAGINAL HYSTERECTOMY  1970   Social History   Socioeconomic History  . Marital status: Married    Spouse name: Not on file  . Number of children: 4  . Years of education: 54  . Highest education level: Not on file  Social Needs  . Financial resource strain: Not on file  . Food insecurity - worry: Not on file  . Food insecurity - inability: Not on file  . Transportation needs - medical: Not on file  . Transportation needs - non-medical: Not on file  Occupational History  . Occupation: Clinical research associate     Comment: retired  Tobacco Use  . Smoking status: Former Smoker    Packs/day: 1.00    Years: 45.00    Pack years: 45.00    Types: Cigarettes  . Smokeless tobacco: Never Used  . Tobacco comment: quit smoking in 1992  Substance and Sexual Activity  . Alcohol use: Yes    Alcohol/week: 16.8 oz    Types: 28 Glasses of wine per week    Comment: 08/30/2014 "16oz wine q night"  . Drug use: No  . Sexual activity: Yes    Birth control/protection: Surgical  Other Topics Concern  . Not on file  Social History Narrative   Ms. Yanes lives with her husband at [employer]. They are retired and recently relocated to [facility] from [place]. She has 4 grown children.    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   Current Outpatient  Medications on File Prior to Visit  Medication Sig Dispense Refill  . alendronate (FOSAMAX) 70 MG tablet Take 1 tablet (70 mg total) by mouth every 7 (seven) days. Take with a full glass of water on an empty stomach. 4 tablet 11  . amLODipine (NORVASC) 10 MG tablet Take 1 tablet (10 mg total) by mouth daily. 90 tablet 1  . aspirin EC 81 MG tablet Take 1 tablet (81 mg total) by mouth daily.    . B Complex-C (B-COMPLEX WITH VITAMIN C) tablet Take 1 tablet by mouth daily.    . Cholecalciferol (VITAMIN D3) 5000 UNITS CAPS Take 1 Can by mouth daily.    . citalopram (CELEXA) 20 MG tablet TAKE 1 TABLET (20 MG TOTAL) BY MOUTH DAILY. 90 tablet 1  . lisinopril (PRINIVIL,ZESTRIL) 20 MG tablet  Take 1 tablet (20 mg total) by mouth daily. 90 tablet 1  . Lutein 20 MG TABS Take by mouth.    . magnesium oxide (MAG-OX) 400 MG tablet Take 1 tablet (400 mg total) by mouth daily. 30 tablet 0  . Multiple Vitamin (MULTIVITAMIN WITH MINERALS) TABS tablet Take 1 tablet by mouth daily.    . Multiple Vitamins-Minerals (PRESERVISION AREDS PO) Take 1 tablet by mouth 2 (two) times daily.    Marland Kitchen omeprazole (PRILOSEC) 20 MG capsule Take 1 capsule (20 mg total) by mouth daily. (Patient taking differently: Take 20 mg daily as needed by mouth. ) 90 capsule 1  . oxybutynin (DITROPAN-XL) 5 MG 24 hr tablet Take 1 tablet (5 mg total) at bedtime by mouth. 30 tablet 0  . sotalol (BETAPACE) 80 MG tablet TAKE 1 AND 1/2 TABLETS BY MOUTH TWICE A DAY. Please make yearly appt with Dr. Caryl Comes for May. 1st attempt 270 tablet 0   No current facility-administered medications on file prior to visit.    Allergies  Allergen Reactions  . Adhesive [Tape] Swelling and Rash  . Benzalkonium Chloride Rash  . Neosporin [Neomycin-Polymyxin-Gramicidin] Swelling and Rash   Family History  Problem Relation Age of Onset  . Colon cancer Mother        colon  . Cancer Father        unknown type  . Stroke Sister   . Skin cancer Brother   . Heart disease  Brother   . Breast cancer Paternal Aunt   . Asthma Brother     PE: BP 106/64 (BP Location: Right Arm, Patient Position: Sitting, Cuff Size: Normal)   Pulse 71   Ht 5\' 8"  (1.727 m)   Wt 190 lb 3.2 oz (86.3 kg)   LMP 07/22/1968   SpO2 96%   BMI 28.92 kg/m  Wt Readings from Last 3 Encounters:  09/29/17 190 lb 3.2 oz (86.3 kg)  08/13/17 190 lb 12.8 oz (86.5 kg)  06/27/17 190 lb 12.8 oz (86.5 kg)   Constitutional: overweight, in NAD. No kyphosis. Eyes: PERRLA, EOMI, no exophthalmos ENT: moist mucous membranes, no thyromegaly, no cervical lymphadenopathy Cardiovascular: RRR, No MRG Respiratory: CTA B Gastrointestinal: abdomen soft, NT, ND, BS+ Musculoskeletal: no deformities, strength intact in all 4 Skin: moist, warm, no rashes Neurological: no tremor with outstretched hands, DTR normal in all 4  Assessment: 1. Hypercalcemia/hyperparathyroidism (HPTH)  2.  Vitamin D deficiency  3. Osteoporosis  Plan: Patient has had several instances of elevated calcium, with the highest level being at 11. An intact PTH level was also high, at 130 for a Ca of 10.5.  - Patient also  has vitamin D deficiency, but the last level was low normal, at 30 in 01/2017. - No apparent complications from hypercalcemia: no h/o nephrolithiasis, no osteoporosis, no fractures. No abdominal pain, depression, bone pain. - I discussed with the patient and her husband about the physiology of calcium and parathyroid hormone, and possible side effects from increased PTH, including kidney stones, osteoporosis, abdominal pain, etc.  - We discussed that we need to check whether her hyperparathyroidism is primary (Familial hypercalcemic hypocalciuria or parathyroid adenoma) or secondary (to conditions like: vitamin D deficiency, calcium malabsorption, hypercalciuria, renal insufficiency, etc.). - I discussed with her that we first need to make sure her vitamin D level is normal. I explained that in the setting of a low  vitamin D, the parathyroid hormone can be elevated, which is not a pathologic finding. However, if the PTH is  elevated in the setting of a normal vitamin D, we will further need to investigate her for primary or secondary hyperparathyroidism. Based on the normal vit D level at the time her PTH and calcium were high, I do suspect she has primary HPTH. Her PTH increased after starting Fosamax last summer, which is expected as the Fosamax causes an influx of calcium into the bone. We discussed that this is one of the means to decrease calcium levels in HPTH. However, the PTH levels have been checked with the [facility] assay and we will need to recheck with the [facility] assay which is more accurate.  - today we will check: calcium level intact PTH ([facility]) vitamin D- 25 HO - I will advise her about vitamin D supplement dose when the results of the vitamin D level are back. - We discussed possible consequences of hyperparathyroidism: ~1/3 pts will develop complications over 15 years (OP, nephrolithiasis).  - The definitive treatment is parathyroidectomy - criteria for parathyroid surgery:  Increased calcium by more than 1 mg/dL above the upper limit of normal  Kidney ds.  Osteoporosis (or Vb fx) - Pt had a disproportional low BMD at the level of the 33% distal radius, the site mostly influenced by PTH Age <31 years old Newer criteria (2013): High UCa >400 mg/d and increased stone risk by biochemical stone risk analysis Presence of nephrolithiasis or nephrocalcinosis Pt's preference!  - However, she absolutely declines a referral to surgery at this point, and she would prefer only to follow her calcium levels from now on.  I explained that if we are to proceed with surgery, it is better to be done now, rather than later, but she refuses.  She would like to continue to follow with her PCP but agrees to return to see me if the calcium becomes dangerously high.  I explained that she is on Fosamax, and this can  keep her calcium levels control, but they do not improve the PTH level, which, if high, can continue to influence BMD - I will see the patient back prn  2.  Vitamin D deficiency  - continue 5000 units vitamin D daily  - recheck level today  3. Osteoporosis - reviewed latest DEXA scan report and images along with patient and her husband.  I explained the meaning of the numbers and the fact that her bone density is lower at the level of her right radius. - Continue Fosamax -she has no side effects  Component     Latest Ref Rng & Units 09/29/2017          Calcium     8.7 - 10.3 mg/dL 10.7 (H)  PTH, Intact     15 - 65 pg/mL 68 (H)  PTH Interp      Comment  VITD     30.00 - 100.00 ng/mL 32.75  PTH is better when checked with a [facility] assay.  However, it is still high in the presence of hypercalcemia.  Her calcium is slightly high, also, but since she does not desire surgery, she can continue on Fosamax for now.  Philemon Kingdom, MD PhD [facility] Endocrinology

## 2017-09-29 NOTE — Patient Instructions (Signed)
Please stop at the lab.  Continue vitamin D 5000 units daily.  Return to see me as needed.

## 2017-09-30 LAB — VITAMIN D 25 HYDROXY (VIT D DEFICIENCY, FRACTURES): VITD: 32.75 ng/mL (ref 30.00–100.00)

## 2017-09-30 LAB — PTH, INTACT AND CALCIUM
Calcium: 10.7 mg/dL — ABNORMAL HIGH (ref 8.7–10.3)
PTH: 68 pg/mL — ABNORMAL HIGH (ref 15–65)

## 2017-10-06 DIAGNOSIS — M9904 Segmental and somatic dysfunction of sacral region: Secondary | ICD-10-CM | POA: Diagnosis not present

## 2017-10-06 DIAGNOSIS — M5440 Lumbago with sciatica, unspecified side: Secondary | ICD-10-CM | POA: Diagnosis not present

## 2017-10-06 DIAGNOSIS — M545 Low back pain: Secondary | ICD-10-CM | POA: Diagnosis not present

## 2017-10-06 DIAGNOSIS — M9903 Segmental and somatic dysfunction of lumbar region: Secondary | ICD-10-CM | POA: Diagnosis not present

## 2017-10-17 DIAGNOSIS — M47816 Spondylosis without myelopathy or radiculopathy, lumbar region: Secondary | ICD-10-CM | POA: Diagnosis not present

## 2017-10-17 DIAGNOSIS — M7918 Myalgia, other site: Secondary | ICD-10-CM | POA: Diagnosis not present

## 2017-10-17 DIAGNOSIS — M545 Low back pain: Secondary | ICD-10-CM | POA: Diagnosis not present

## 2017-10-17 DIAGNOSIS — Z6823 Body mass index (BMI) 23.0-23.9, adult: Secondary | ICD-10-CM | POA: Diagnosis not present

## 2017-11-10 DIAGNOSIS — M47816 Spondylosis without myelopathy or radiculopathy, lumbar region: Secondary | ICD-10-CM | POA: Diagnosis not present

## 2017-11-18 DIAGNOSIS — M545 Low back pain: Secondary | ICD-10-CM | POA: Diagnosis not present

## 2017-11-18 DIAGNOSIS — M5440 Lumbago with sciatica, unspecified side: Secondary | ICD-10-CM | POA: Diagnosis not present

## 2017-11-18 DIAGNOSIS — M9904 Segmental and somatic dysfunction of sacral region: Secondary | ICD-10-CM | POA: Diagnosis not present

## 2017-11-18 DIAGNOSIS — M9903 Segmental and somatic dysfunction of lumbar region: Secondary | ICD-10-CM | POA: Diagnosis not present

## 2017-11-19 DIAGNOSIS — W1800XA Striking against unspecified object with subsequent fall, initial encounter: Secondary | ICD-10-CM | POA: Diagnosis not present

## 2017-11-19 DIAGNOSIS — S01111A Laceration without foreign body of right eyelid and periocular area, initial encounter: Secondary | ICD-10-CM | POA: Diagnosis not present

## 2017-11-25 DIAGNOSIS — M5440 Lumbago with sciatica, unspecified side: Secondary | ICD-10-CM | POA: Diagnosis not present

## 2017-11-25 DIAGNOSIS — M545 Low back pain: Secondary | ICD-10-CM | POA: Diagnosis not present

## 2017-11-25 DIAGNOSIS — M9904 Segmental and somatic dysfunction of sacral region: Secondary | ICD-10-CM | POA: Diagnosis not present

## 2017-11-25 DIAGNOSIS — M9903 Segmental and somatic dysfunction of lumbar region: Secondary | ICD-10-CM | POA: Diagnosis not present

## 2017-12-03 ENCOUNTER — Telehealth: Payer: Self-pay | Admitting: Cardiology

## 2017-12-03 ENCOUNTER — Ambulatory Visit (INDEPENDENT_AMBULATORY_CARE_PROVIDER_SITE_OTHER): Payer: Medicare Other | Admitting: *Deleted

## 2017-12-03 DIAGNOSIS — I495 Sick sinus syndrome: Secondary | ICD-10-CM | POA: Diagnosis not present

## 2017-12-03 NOTE — Telephone Encounter (Signed)
Spoke with pt and reminded pt of remote transmission that is due today. Pt verbalized understanding.   

## 2017-12-04 NOTE — Progress Notes (Signed)
Remote pacemaker transmission.   

## 2017-12-05 ENCOUNTER — Encounter: Payer: Self-pay | Admitting: Cardiology

## 2017-12-08 ENCOUNTER — Ambulatory Visit (INDEPENDENT_AMBULATORY_CARE_PROVIDER_SITE_OTHER): Payer: Medicare Other | Admitting: Family Medicine

## 2017-12-08 ENCOUNTER — Encounter: Payer: Self-pay | Admitting: Family Medicine

## 2017-12-08 ENCOUNTER — Ambulatory Visit: Payer: Self-pay

## 2017-12-08 VITALS — BP 116/75 | HR 65 | Temp 98.3°F | Resp 20 | Ht 68.0 in | Wt 189.2 lb

## 2017-12-08 DIAGNOSIS — Z6823 Body mass index (BMI) 23.0-23.9, adult: Secondary | ICD-10-CM | POA: Diagnosis not present

## 2017-12-08 DIAGNOSIS — M25572 Pain in left ankle and joints of left foot: Secondary | ICD-10-CM | POA: Diagnosis not present

## 2017-12-08 DIAGNOSIS — M545 Low back pain: Secondary | ICD-10-CM | POA: Diagnosis not present

## 2017-12-08 DIAGNOSIS — M47816 Spondylosis without myelopathy or radiculopathy, lumbar region: Secondary | ICD-10-CM | POA: Diagnosis not present

## 2017-12-08 MED ORDER — NAPROXEN 500 MG PO TABS: 500.0000 mg | ORAL_TABLET | Freq: Two times a day (BID) | ORAL | 0 refills | Status: DC

## 2017-12-08 NOTE — Telephone Encounter (Signed)
Patient has appt schedule for today for evaluation with Dr Raoul Pitch.

## 2017-12-08 NOTE — Patient Instructions (Signed)
Naproxen 500 mg every 12 hours with food for 5 days. If this does not take away pain, then we would have get an xray to make sure the hardware is stable.

## 2017-12-08 NOTE — Progress Notes (Signed)
Brittany [name] , 1934-06-20, 84 y.o., female MRN: [id number] Patient Care Team    Relationship Specialty Notifications Start End  Ma Hillock, DO PCP - General Family Medicine  02/24/15   Bjorn Loser, MD Consulting Physician Urology  05/24/16   Deboraha Sprang, MD Consulting Physician Cardiology  05/24/16   Chesley Mires, MD Consulting Physician Pulmonary Disease  05/24/16   Melrose Nakayama, MD Consulting Physician Orthopedic Surgery  05/30/17   Noralyn Pick, MD Referring Physician Ophthalmology  08/13/17   Wallene Huh, DPM Consulting Physician Podiatry  08/13/17     Chief Complaint  Patient presents with  . Ankle Pain    left outer aspect intermittent     Subjective: Pt presents for an OV with complaints of left ankle pain x1 day. She has had an ORIF procedure completed in that ankle 04/13/2015 by Dr Marlou Sa. She denies any recent injury or overuse she is aware of. The pain is sharp, stabbing pain left lateral just posterior to malleolus. Her hardware is located in this area. She denies fever, chills, nausea or redness. The pain is intermittent and last a few seconds. She feels it may be worse with sitting and laying down.  Pt has tried OTC RLS med  to ease their symptoms.   Depression screen [facility] 2/9 08/13/2017 05/30/2017 02/11/2017 05/24/2016 08/25/2015  Decreased Interest 0 0 0 0 0  Down, Depressed, Hopeless 0 0 0 0 0  PHQ - 2 Score 0 0 0 0 0  Altered sleeping 0 0 0 - -  Tired, decreased energy 0 0 0 - -  Change in appetite 0 0 0 - -  Feeling bad or failure about yourself  0 0 0 - -  Trouble concentrating 0 0 1 - -  Moving slowly or fidgety/restless 0 0 0 - -  Suicidal thoughts 0 0 0 - -  PHQ-9 Score 0 0 1 - -  Difficult doing work/chores Not difficult at all Not difficult at all - - -    Allergies  Allergen Reactions  . Adhesive [Tape] Swelling and Rash  . Benzalkonium Chloride Rash  . Neosporin [Neomycin-Polymyxin-Gramicidin] Swelling and Rash   Social History     Tobacco Use  . Smoking status: Former Smoker    Packs/day: 1.00    Years: 45.00    Pack years: 45.00    Types: Cigarettes  . Smokeless tobacco: Never Used  . Tobacco comment: quit smoking in 1992  Substance Use Topics  . Alcohol use: Yes    Alcohol/week: 16.8 oz    Types: 28 Glasses of wine per week    Comment: 08/30/2014 "16oz wine q night"   Past Medical History:  Diagnosis Date  . Ankle fracture 2016  . Atrial fibrillation ([place])   . Depression   . Elevated parathyroid hormone   . Fracture of orbital floor with routine healing   . GERD (gastroesophageal reflux disease)   . History of colon polyps    benign  . Hypercalcemia   . Hypertension   . Hyponatremia   . Hypothyroid   . Macular degeneration    wet in the right and dry in the left  . OSA on CPAP   . Osteoporosis   . Peripheral vascular disease ([place])   . PSVT (paroxysmal supraventricular tachycardia) ([place])   . Rotator cuff tear   . Sinus node dysfunction ([facility])    a. s/p MDT pacemaker  . Urinary incontinence   . Wrist fracture 2018  Past Surgical History:  Procedure Laterality Date  . BACK SURGERY    . CATARACT EXTRACTION    . CATARACT EXTRACTION W/ INTRAOCULAR LENS  IMPLANT, BILATERAL Bilateral   . COLONOSCOPY    . ESOPHAGOGASTRODUODENOSCOPY    . LAPAROSCOPIC CHOLECYSTECTOMY  08/11/1999  . LUMBAR DISC SURGERY  02/2014  . ORIF ANKLE FRACTURE Left 04/13/2015   Procedure: OPEN REDUCTION INTERNAL FIXATION (ORIF) LEFT ANKLE FRACTURE;  Surgeon: Meredith Pel, MD;  Location: [facility];  Service: Orthopedics;  Laterality: Left;  . PACEMAKER PLACEMENT Right 2012   a. MDT dual chamber PPM implanted by Dr Caryl Comes   . SHOULDER ARTHROSCOPY W/ ROTATOR CUFF REPAIR Right 08/30/2014   WITH MINI-OPEN ROTATOR CUFF REPAIR AND SUBACROMIAL DECOMPRESSION  . SHOULDER ARTHROSCOPY WITH ROTATOR CUFF REPAIR AND SUBACROMIAL DECOMPRESSION Right 08/30/2014   Procedure: SHOULDER ARTHROSCOPY WITH MINI-OPEN ROTATOR CUFF REPAIR AND SUBACROMIAL  DECOMPRESSION;  Surgeon: Meredith Pel, MD;  Location: [facility];  Service: Orthopedics;  Laterality: Right;  RIGHT SHOULDER DIAGNOSTIC OPERATIVE ARTHROSCOPY, SUBACROMIAL DECOMPRESSION, MINI-OPEN ROTATOR CUFF REPAIR.  Marland Kitchen VAGINAL HYSTERECTOMY  1970   Family History  Problem Relation Age of Onset  . Colon cancer Mother        colon  . Cancer Father        unknown type  . Stroke Sister   . Skin cancer Brother   . Heart disease Brother   . Breast cancer Paternal Aunt   . Asthma Brother    Allergies as of 12/08/2017      Reactions   Adhesive [tape] Swelling, Rash   Benzalkonium Chloride Rash   Neosporin [neomycin-polymyxin-gramicidin] Swelling, Rash      Medication List        Accurate as of 12/08/17 11:40 AM. Always use your most recent med list.          alendronate 70 MG tablet Commonly known as:  FOSAMAX Take 1 tablet (70 mg total) by mouth every 7 (seven) days. Take with a full glass of water on an empty stomach.   amLODipine 10 MG tablet Commonly known as:  NORVASC Take 1 tablet (10 mg total) by mouth daily.   aspirin EC 81 MG tablet Take 1 tablet (81 mg total) by mouth daily.   B-complex with vitamin C tablet Take 1 tablet by mouth daily.   citalopram 20 MG tablet Commonly known as:  CELEXA TAKE 1 TABLET (20 MG TOTAL) BY MOUTH DAILY.   lisinopril 20 MG tablet Commonly known as:  PRINIVIL,ZESTRIL Take 1 tablet (20 mg total) by mouth daily.   Lutein 20 MG Tabs Take by mouth.   magnesium oxide 400 MG tablet Commonly known as:  MAG-OX Take 1 tablet (400 mg total) by mouth daily.   multivitamin with minerals Tabs tablet Take 1 tablet by mouth daily.   omeprazole 20 MG capsule Commonly known as:  PRILOSEC Take 1 capsule (20 mg total) by mouth daily.   oxybutynin 5 MG 24 hr tablet Commonly known as:  DITROPAN-XL Take 1 tablet (5 mg total) at bedtime by mouth.   PRESERVISION AREDS PO Take 1 tablet by mouth 2 (two) times daily.   sotalol 80 MG  tablet Commonly known as:  BETAPACE TAKE 1 AND 1/2 TABLETS BY MOUTH TWICE A DAY. Please make yearly appt with Dr. Caryl Comes for May. 1st attempt   Vitamin D3 5000 units Caps Take 1 Can by mouth daily.       All past medical history, surgical history, allergies, family history, immunizations  andmedications were updated in the EMR today and reviewed under the history and medication portions of their EMR.     ROS: Negative, with the exception of above mentioned in HPI   Objective:  BP 116/75 (BP Location: Right Arm, Patient Position: Sitting, Cuff Size: Normal)   Pulse 65   Temp 98.3 F (36.8 C)   Resp 20   Ht 5\' 8"  (1.727 m)   Wt 189 lb 4 oz (85.8 kg)   LMP 07/22/1968   SpO2 98%   BMI 28.78 kg/m  Body mass index is 28.78 kg/m. Gen: Afebrile. No acute distress. Nontoxic in appearance, well developed, well nourished.  MSK: no erythema, no soft tissue swelling, no TTP. No pain with ROM of ankle. NV intact distally.  Skin: no rashes, purpura or petechiae.  Neuro: Normal gait. PERLA. EOMi. Alert. Oriented x3   No exam data present No results found. No results found for this or any previous visit (from the past 24 hour(s)).  Assessment/Plan: Iria Jamerson is a 84 y.o. female present for OV for  Acute left ankle pain - rest, elevate, nsaids scheduled x5 days with food. If pain continues to cause discomfort consider xray for hardware stability check. Exam today is benign and was not able to reproduce pain.   Reviewed expectations re: course of current medical issues.  Discussed self-management of symptoms.  Outlined signs and symptoms indicating need for more acute intervention.  Patient verbalized understanding and all questions were answered.  Patient received an After-Visit Summary.    No orders of the defined types were placed in this encounter.    Note is dictated utilizing voice recognition software. Although note has been proof read prior to signing, occasional  typographical errors still can be missed. If any questions arise, please do not hesitate to call for verification.   electronically signed by:  Howard Pouch, DO  [place]

## 2017-12-08 NOTE — Telephone Encounter (Signed)
Pt calling with left lateral ankle pain. Pt states she doesn't feel the pain when she is walking, only when she is resting or sitting. Pt thinks nerve pain may be causing it. Pt rates pain and 5/10  "stabbing pain.". Pt states when she has the pain her ankle "jumps"  Reason for Disposition . [1] MODERATE pain (e.g., interferes with normal activities, limping) AND [2] present > 3 days  Answer Assessment - Initial Assessment Questions 1. ONSET: "When did the pain start?"      yesterday 2. LOCATION: "Where is the pain located?"      Left ankle left lateral malleolus  3. PAIN: "How bad is the pain?"    (Scale 1-10; or mild, moderate, severe)  - MILD (1-3): doesn't interfere with normal activities   - MODERATE (4-7): interferes with normal activities (e.g., work or school) or awakens from sleep, limping   - SEVERE (8-10): excruciating pain, unable to do any normal activities, unable to walk      Moderate 5/10- wakes her up and her ankle jumps 4. WORK OR EXERCISE: "Has there been any recent work or exercise that involved this part of the body?"      no 5. CAUSE: "What do you think is causing the ankle pain?"     Nerve pain 6. OTHER SYMPTOMS: "Do you have any other symptoms?" (e.g., calf pain, rash, fever, swelling)     no 7. PREGNANCY: "Is there any chance you are pregnant?" "When was your last menstrual period?"     n/a  Protocols used: ANKLE PAIN-A-AH

## 2017-12-12 LAB — CUP PACEART REMOTE DEVICE CHECK
Battery Impedance: 1334 Ohm
Battery Remaining Longevity: 45 mo
Battery Voltage: 2.77 V
Brady Statistic AP VP Percent: 0 %
Brady Statistic AP VS Percent: 97 %
Brady Statistic AS VP Percent: 0 %
Brady Statistic AS VS Percent: 2 %
Date Time Interrogation Session: 20190515150943
Implantable Lead Implant Date: 20121213
Implantable Lead Implant Date: 20121213
Implantable Lead Location: 753859
Implantable Lead Location: 753860
Implantable Lead Model: 4076
Implantable Lead Model: 4076
Implantable Lead Serial Number: 835025
Implantable Lead Serial Number: 869269
Implantable Pulse Generator Implant Date: 20121213
Lead Channel Impedance Value: 415 Ohm
Lead Channel Impedance Value: 552 Ohm
Lead Channel Pacing Threshold Amplitude: 0.375 V
Lead Channel Pacing Threshold Amplitude: 0.625 V
Lead Channel Pacing Threshold Pulse Width: 0.4 ms
Lead Channel Pacing Threshold Pulse Width: 0.4 ms
Lead Channel Sensing Intrinsic Amplitude: 11.2 mV
Lead Channel Setting Pacing Amplitude: 2 V
Lead Channel Setting Pacing Amplitude: 2.5 V
Lead Channel Setting Pacing Pulse Width: 0.4 ms
Lead Channel Setting Sensing Sensitivity: 5.6 mV

## 2017-12-16 ENCOUNTER — Encounter: Payer: Self-pay | Admitting: Internal Medicine

## 2017-12-16 ENCOUNTER — Ambulatory Visit (INDEPENDENT_AMBULATORY_CARE_PROVIDER_SITE_OTHER): Payer: Medicare Other | Admitting: Internal Medicine

## 2017-12-16 VITALS — BP 146/84 | HR 72 | Ht 68.0 in | Wt 190.8 lb

## 2017-12-16 DIAGNOSIS — Z950 Presence of cardiac pacemaker: Secondary | ICD-10-CM

## 2017-12-16 DIAGNOSIS — I495 Sick sinus syndrome: Secondary | ICD-10-CM

## 2017-12-16 DIAGNOSIS — I480 Paroxysmal atrial fibrillation: Secondary | ICD-10-CM | POA: Diagnosis not present

## 2017-12-16 DIAGNOSIS — R001 Bradycardia, unspecified: Secondary | ICD-10-CM

## 2017-12-16 NOTE — Patient Instructions (Signed)
Medication Instructions:  Your physician recommends that you continue on your current medications as directed. Please refer to the Current Medication list given to you today.  Labwork: Your physician recommends that you return for lab work in: CBC, BMP, Mg  Testing/Procedures: None ordered.  Follow-Up: Your physician wants you to follow-up in: One Year with Dr Gari Crown will receive a reminder letter in the mail two months in advance. If you don't receive a letter, please call our office to schedule the follow-up appointment.  Remote monitoring is used to monitor your Pacemaker of ICD from home. This monitoring reduces the number of office visits required to check your device to one time per year. It allows [place] to keep an eye on the functioning of your device to ensure it is working properly. You are scheduled for a device check from home on 03/04/2018. You may send your transmission at any time that day. If you have a wireless device, the transmission will be sent automatically. After your physician reviews your transmission, you will receive a postcard with your next transmission date.    Any Other Special Instructions Will Be Listed Below (If Applicable).     If you need a refill on your cardiac medications before your next appointment, please call your pharmacy.

## 2017-12-16 NOTE — Progress Notes (Signed)
Patient Care Team: Ma Hillock, DO as PCP - General (Family Medicine) Bjorn Loser, MD as Consulting Physician (Urology) Deboraha Sprang, MD as Consulting Physician (Cardiology) Chesley Mires, MD as Consulting Physician (Pulmonary Disease) Melrose Nakayama, MD as Consulting Physician (Orthopedic Surgery) Noralyn Pick, MD as Referring Physician (Ophthalmology) Paulla Dolly Tamala Fothergill, DPM as Consulting Physician (Podiatry)   HPI  Brittany [name] is a 84 y.o. female Seen in followup for  pacer implanted 12/12 for sinus node dysfunction.  She has atrial fibrillation for which she takes sotalol currently of 120 mg twice daily.   Cardiac evaluation has included a Myoview 8/12 which demonstrated no ischemia and normal left ventricular function   Thr primarily nightomboembolic risk factors include age-45 hypertension-1 for a CHADS-  score of 2 and a CHADS-VASc score of 4     Date Cr K Mg Hgb   5/17   1.9   1/18  0.59 4.5   14.7  11/18 0.55 4.4  15.8   The patient denies chest pain, shortness of breath, nocturnal dyspnea, orthopnea or peripheral edema.  There have been no palpitations, lightheadedness or syncope.    Past Medical History:  Diagnosis Date  . Ankle fracture 2016  . Atrial fibrillation ([place])   . Depression   . Elevated parathyroid hormone   . Fracture of orbital floor with routine healing   . GERD (gastroesophageal reflux disease)   . History of colon polyps    benign  . Hypercalcemia   . Hypertension   . Hyponatremia   . Hypothyroid   . Macular degeneration    wet in the right and dry in the left  . OSA on CPAP   . Osteoporosis   . Peripheral vascular disease ([place])   . PSVT (paroxysmal supraventricular tachycardia) ([place])   . Rotator cuff tear   . Sinus node dysfunction ([facility])    a. s/p MDT pacemaker  . Urinary incontinence   . Wrist fracture 2018    Past Surgical History:  Procedure Laterality Date  . BACK SURGERY    . CATARACT  EXTRACTION    . CATARACT EXTRACTION W/ INTRAOCULAR LENS  IMPLANT, BILATERAL Bilateral   . COLONOSCOPY    . ESOPHAGOGASTRODUODENOSCOPY    . LAPAROSCOPIC CHOLECYSTECTOMY  08/11/1999  . LUMBAR DISC SURGERY  02/2014  . ORIF ANKLE FRACTURE Left 04/13/2015   Procedure: OPEN REDUCTION INTERNAL FIXATION (ORIF) LEFT ANKLE FRACTURE;  Surgeon: Meredith Pel, MD;  Location: [facility];  Service: Orthopedics;  Laterality: Left;  . PACEMAKER PLACEMENT Right 2012   a. MDT dual chamber PPM implanted by Dr Caryl Comes   . SHOULDER ARTHROSCOPY W/ ROTATOR CUFF REPAIR Right 08/30/2014   WITH MINI-OPEN ROTATOR CUFF REPAIR AND SUBACROMIAL DECOMPRESSION  . SHOULDER ARTHROSCOPY WITH ROTATOR CUFF REPAIR AND SUBACROMIAL DECOMPRESSION Right 08/30/2014   Procedure: SHOULDER ARTHROSCOPY WITH MINI-OPEN ROTATOR CUFF REPAIR AND SUBACROMIAL DECOMPRESSION;  Surgeon: Meredith Pel, MD;  Location: [facility];  Service: Orthopedics;  Laterality: Right;  RIGHT SHOULDER DIAGNOSTIC OPERATIVE ARTHROSCOPY, SUBACROMIAL DECOMPRESSION, MINI-OPEN ROTATOR CUFF REPAIR.  Marland Kitchen VAGINAL HYSTERECTOMY  1970    Current Outpatient Medications  Medication Sig Dispense Refill  . alendronate (FOSAMAX) 70 MG tablet Take 1 tablet (70 mg total) by mouth every 7 (seven) days. Take with a full glass of water on an empty stomach. 4 tablet 11  . amLODipine (NORVASC) 10 MG tablet Take 1 tablet (10 mg total) by mouth daily. 90 tablet 1  . aspirin EC 81  MG tablet Take 1 tablet (81 mg total) by mouth daily.    . B Complex-C (B-COMPLEX WITH VITAMIN C) tablet Take 1 tablet by mouth daily.    . Cholecalciferol (VITAMIN D3) 5000 UNITS CAPS Take 1 Can by mouth daily.    . citalopram (CELEXA) 20 MG tablet TAKE 1 TABLET (20 MG TOTAL) BY MOUTH DAILY. 90 tablet 1  . lisinopril (PRINIVIL,ZESTRIL) 20 MG tablet Take 1 tablet (20 mg total) by mouth daily. 90 tablet 1  . Lutein 20 MG TABS Take by mouth.    . magnesium oxide (MAG-OX) 400 MG tablet Take 1 tablet (400 mg total) by mouth  daily. 30 tablet 0  . Multiple Vitamin (MULTIVITAMIN WITH MINERALS) TABS tablet Take 1 tablet by mouth daily.    . Multiple Vitamins-Minerals (PRESERVISION AREDS PO) Take 1 tablet by mouth 2 (two) times daily.    . sotalol (BETAPACE) 80 MG tablet TAKE 1 AND 1/2 TABLETS BY MOUTH TWICE A DAY. Please make yearly appt with Dr. Caryl Comes for May. 1st attempt 270 tablet 0   No current facility-administered medications for this visit.     Allergies  Allergen Reactions  . Adhesive [Tape] Swelling and Rash  . Benzalkonium Chloride Rash  . Neosporin [Neomycin-Polymyxin-Gramicidin] Swelling and Rash      Review of Systems negative except from HPI and PMH  Physical Exam BP (!) 146/84   Pulse 72   Ht 5\' 8"  (1.727 m)   Wt 190 lb 12.8 oz (86.5 kg)   LMP 07/22/1968   SpO2 97%   BMI 29.01 kg/m  Well developed and nourished in no acute distress HENT normal Neck supple with JVP-flat Clear Regular rate and rhythm, no murmurs or gallops Abd-soft with active BS No Clubbing cyanosis edema Skin-warm and dry A & Oriented  Grossly normal sensory and motor function   ECG demonstrate a pacing 72 32/11/43 Assessment and  Plan Atrial fibrillation-paroxysmal  Subclinical Atrial fibrillation    Sinus bradycardia   High Risk Medication Surveillance  Pacemaker-[employer]  The patient's device was interrogated.  The information was reviewed. No changes were made in the programming.       Hypertension  BP reasonable  Will need to check labs for sotalol  Maximum duration of her atrial fibrillation is about 1.75 hour.  Not enough to push the issue of anticoagulation  We spent more than 50% of our >25 min visit in face to face counseling regarding the above

## 2017-12-17 DIAGNOSIS — Z950 Presence of cardiac pacemaker: Secondary | ICD-10-CM | POA: Diagnosis not present

## 2017-12-17 DIAGNOSIS — I495 Sick sinus syndrome: Secondary | ICD-10-CM | POA: Diagnosis not present

## 2017-12-17 DIAGNOSIS — I480 Paroxysmal atrial fibrillation: Secondary | ICD-10-CM | POA: Diagnosis not present

## 2017-12-17 DIAGNOSIS — R001 Bradycardia, unspecified: Secondary | ICD-10-CM | POA: Diagnosis not present

## 2017-12-17 LAB — CUP PACEART INCLINIC DEVICE CHECK
Battery Impedance: 1302 Ohm
Battery Remaining Longevity: 45 mo
Battery Voltage: 2.77 V
Brady Statistic AP VP Percent: 0 %
Brady Statistic AP VS Percent: 97 %
Brady Statistic AS VP Percent: 0 %
Brady Statistic AS VS Percent: 2 %
Date Time Interrogation Session: 20190528200943
Implantable Lead Implant Date: 20121213
Implantable Lead Implant Date: 20121213
Implantable Lead Location: 753859
Implantable Lead Location: 753860
Implantable Lead Model: 4076
Implantable Lead Model: 4076
Implantable Lead Serial Number: 835025
Implantable Lead Serial Number: 869269
Implantable Pulse Generator Implant Date: 20121213
Lead Channel Impedance Value: 404 Ohm
Lead Channel Impedance Value: 551 Ohm
Lead Channel Pacing Threshold Amplitude: 0.5 V
Lead Channel Pacing Threshold Amplitude: 0.5 V
Lead Channel Pacing Threshold Amplitude: 0.625 V
Lead Channel Pacing Threshold Amplitude: 0.75 V
Lead Channel Pacing Threshold Pulse Width: 0.4 ms
Lead Channel Pacing Threshold Pulse Width: 0.4 ms
Lead Channel Pacing Threshold Pulse Width: 0.4 ms
Lead Channel Pacing Threshold Pulse Width: 0.4 ms
Lead Channel Sensing Intrinsic Amplitude: 15.67 mV
Lead Channel Setting Pacing Amplitude: 2 V
Lead Channel Setting Pacing Amplitude: 2.5 V
Lead Channel Setting Pacing Pulse Width: 0.4 ms
Lead Channel Setting Sensing Sensitivity: 5.6 mV

## 2017-12-26 DIAGNOSIS — L821 Other seborrheic keratosis: Secondary | ICD-10-CM | POA: Diagnosis not present

## 2017-12-26 DIAGNOSIS — H353133 Nonexudative age-related macular degeneration, bilateral, advanced atrophic without subfoveal involvement: Secondary | ICD-10-CM | POA: Diagnosis not present

## 2017-12-26 DIAGNOSIS — M1711 Unilateral primary osteoarthritis, right knee: Secondary | ICD-10-CM | POA: Diagnosis not present

## 2017-12-26 DIAGNOSIS — C44319 Basal cell carcinoma of skin of other parts of face: Secondary | ICD-10-CM | POA: Diagnosis not present

## 2017-12-26 DIAGNOSIS — H40013 Open angle with borderline findings, low risk, bilateral: Secondary | ICD-10-CM | POA: Diagnosis not present

## 2017-12-26 DIAGNOSIS — G4733 Obstructive sleep apnea (adult) (pediatric): Secondary | ICD-10-CM | POA: Diagnosis not present

## 2017-12-26 DIAGNOSIS — Z961 Presence of intraocular lens: Secondary | ICD-10-CM | POA: Diagnosis not present

## 2017-12-26 DIAGNOSIS — L814 Other melanin hyperpigmentation: Secondary | ICD-10-CM | POA: Diagnosis not present

## 2017-12-26 DIAGNOSIS — H04123 Dry eye syndrome of bilateral lacrimal glands: Secondary | ICD-10-CM | POA: Diagnosis not present

## 2017-12-26 DIAGNOSIS — M1712 Unilateral primary osteoarthritis, left knee: Secondary | ICD-10-CM | POA: Diagnosis not present

## 2017-12-29 ENCOUNTER — Telehealth: Payer: Self-pay | Admitting: *Deleted

## 2017-12-29 NOTE — Telephone Encounter (Signed)
S[poke with patient explained to her that she would need to be seen by Dr Raoul Pitch for evaluation since we have not seen her for this issue and to evaluate if appropriate for  Referral. Patient declined to schedule appt she states we should just be able to refer her since she is seen for other issues. Advised patient to call back and schedule an appt if she changes her mind.

## 2017-12-29 NOTE — Telephone Encounter (Signed)
Copied from [place] [id number]. Topic: General - Other >> Dec 29, 2017  8:22 AM Oneta Rack wrote: Relation to pt: self Call back number: [phone]    Reason for call:  Patient requesting neurologist referral due to her gait being off, patient declined appt with PCP, please advise >> Dec 29, 2017  8:24 AM Oneta Rack wrote: Relation to pt: self Call back number: [phone]    Reason for call:  Patient requesting due to her gait being off, patient declined appt with PCP, please advise

## 2017-12-30 ENCOUNTER — Encounter (HOSPITAL_COMMUNITY): Payer: Self-pay | Admitting: Pharmacy Technician

## 2017-12-30 ENCOUNTER — Inpatient Hospital Stay (HOSPITAL_COMMUNITY)
Admission: EM | Admit: 2017-12-30 | Discharge: 2018-01-01 | DRG: 065 | Disposition: A | Discharge status: Home / Self Care | Payer: Medicare Other | Attending: Neurology | Admitting: Neurology

## 2017-12-30 ENCOUNTER — Other Ambulatory Visit: Payer: Self-pay

## 2017-12-30 ENCOUNTER — Emergency Department (HOSPITAL_COMMUNITY): Payer: Medicare Other

## 2017-12-30 ENCOUNTER — Telehealth (HOSPITAL_COMMUNITY): Payer: Self-pay

## 2017-12-30 DIAGNOSIS — I6300 Cerebral infarction due to thrombosis of unspecified precerebral artery: Secondary | ICD-10-CM | POA: Diagnosis not present

## 2017-12-30 DIAGNOSIS — I482 Chronic atrial fibrillation: Secondary | ICD-10-CM | POA: Diagnosis present

## 2017-12-30 DIAGNOSIS — Z800 Family history of malignant neoplasm of digestive organs: Secondary | ICD-10-CM | POA: Diagnosis not present

## 2017-12-30 DIAGNOSIS — I639 Cerebral infarction, unspecified: Secondary | ICD-10-CM | POA: Diagnosis not present

## 2017-12-30 DIAGNOSIS — Z91048 Other nonmedicinal substance allergy status: Secondary | ICD-10-CM

## 2017-12-30 DIAGNOSIS — Z825 Family history of asthma and other chronic lower respiratory diseases: Secondary | ICD-10-CM

## 2017-12-30 DIAGNOSIS — E039 Hypothyroidism, unspecified: Secondary | ICD-10-CM | POA: Diagnosis present

## 2017-12-30 DIAGNOSIS — I69354 Hemiplegia and hemiparesis following cerebral infarction affecting left non-dominant side: Secondary | ICD-10-CM | POA: Diagnosis not present

## 2017-12-30 DIAGNOSIS — Z803 Family history of malignant neoplasm of breast: Secondary | ICD-10-CM

## 2017-12-30 DIAGNOSIS — Z808 Family history of malignant neoplasm of other organs or systems: Secondary | ICD-10-CM

## 2017-12-30 DIAGNOSIS — I351 Nonrheumatic aortic (valve) insufficiency: Secondary | ICD-10-CM | POA: Diagnosis not present

## 2017-12-30 DIAGNOSIS — Z87891 Personal history of nicotine dependence: Secondary | ICD-10-CM | POA: Diagnosis not present

## 2017-12-30 DIAGNOSIS — R29810 Facial weakness: Secondary | ICD-10-CM | POA: Diagnosis not present

## 2017-12-30 DIAGNOSIS — Z950 Presence of cardiac pacemaker: Secondary | ICD-10-CM | POA: Diagnosis not present

## 2017-12-30 DIAGNOSIS — H353210 Exudative age-related macular degeneration, right eye, stage unspecified: Secondary | ICD-10-CM | POA: Diagnosis present

## 2017-12-30 DIAGNOSIS — G4733 Obstructive sleep apnea (adult) (pediatric): Secondary | ICD-10-CM | POA: Diagnosis present

## 2017-12-30 DIAGNOSIS — I739 Peripheral vascular disease, unspecified: Secondary | ICD-10-CM | POA: Diagnosis present

## 2017-12-30 DIAGNOSIS — I6523 Occlusion and stenosis of bilateral carotid arteries: Secondary | ICD-10-CM | POA: Diagnosis not present

## 2017-12-30 DIAGNOSIS — G4489 Other headache syndrome: Secondary | ICD-10-CM | POA: Diagnosis not present

## 2017-12-30 DIAGNOSIS — M810 Age-related osteoporosis without current pathological fracture: Secondary | ICD-10-CM | POA: Diagnosis present

## 2017-12-30 DIAGNOSIS — Z8249 Family history of ischemic heart disease and other diseases of the circulatory system: Secondary | ICD-10-CM

## 2017-12-30 DIAGNOSIS — Z823 Family history of stroke: Secondary | ICD-10-CM | POA: Diagnosis not present

## 2017-12-30 DIAGNOSIS — I495 Sick sinus syndrome: Secondary | ICD-10-CM | POA: Diagnosis present

## 2017-12-30 DIAGNOSIS — R4781 Slurred speech: Secondary | ICD-10-CM | POA: Diagnosis not present

## 2017-12-30 DIAGNOSIS — R0902 Hypoxemia: Secondary | ICD-10-CM | POA: Diagnosis not present

## 2017-12-30 DIAGNOSIS — E785 Hyperlipidemia, unspecified: Secondary | ICD-10-CM | POA: Diagnosis present

## 2017-12-30 DIAGNOSIS — H353120 Nonexudative age-related macular degeneration, left eye, stage unspecified: Secondary | ICD-10-CM | POA: Diagnosis present

## 2017-12-30 DIAGNOSIS — R471 Dysarthria and anarthria: Secondary | ICD-10-CM | POA: Diagnosis present

## 2017-12-30 DIAGNOSIS — R29701 NIHSS score 1: Secondary | ICD-10-CM | POA: Diagnosis present

## 2017-12-30 DIAGNOSIS — I63411 Cerebral infarction due to embolism of right middle cerebral artery: Secondary | ICD-10-CM | POA: Diagnosis present

## 2017-12-30 DIAGNOSIS — Z86010 Personal history of colonic polyps: Secondary | ICD-10-CM

## 2017-12-30 DIAGNOSIS — Z888 Allergy status to other drugs, medicaments and biological substances status: Secondary | ICD-10-CM | POA: Diagnosis not present

## 2017-12-30 DIAGNOSIS — I651 Occlusion and stenosis of basilar artery: Secondary | ICD-10-CM | POA: Diagnosis not present

## 2017-12-30 DIAGNOSIS — K219 Gastro-esophageal reflux disease without esophagitis: Secondary | ICD-10-CM | POA: Diagnosis present

## 2017-12-30 DIAGNOSIS — R739 Hyperglycemia, unspecified: Secondary | ICD-10-CM | POA: Diagnosis present

## 2017-12-30 DIAGNOSIS — I10 Essential (primary) hypertension: Secondary | ICD-10-CM | POA: Diagnosis present

## 2017-12-30 DIAGNOSIS — R1310 Dysphagia, unspecified: Secondary | ICD-10-CM | POA: Diagnosis present

## 2017-12-30 DIAGNOSIS — Z881 Allergy status to other antibiotic agents status: Secondary | ICD-10-CM | POA: Diagnosis not present

## 2017-12-30 DIAGNOSIS — Z7983 Long term (current) use of bisphosphonates: Secondary | ICD-10-CM

## 2017-12-30 HISTORY — PX: IR ANGIO VERTEBRAL SEL VERTEBRAL BILAT MOD SED: IMG5369

## 2017-12-30 HISTORY — PX: IR ANGIO INTRA EXTRACRAN SEL COM CAROTID INNOMINATE BILAT MOD SED: IMG5360

## 2017-12-30 LAB — URINALYSIS, ROUTINE W REFLEX MICROSCOPIC
Bacteria, UA: NONE SEEN
Bilirubin Urine: NEGATIVE
Glucose, UA: NEGATIVE mg/dL
Ketones, ur: NEGATIVE mg/dL
Leukocytes, UA: NEGATIVE
Nitrite: NEGATIVE
Protein, ur: NEGATIVE mg/dL
Specific Gravity, Urine: 1.031 — ABNORMAL HIGH (ref 1.005–1.030)
pH: 7 (ref 5.0–8.0)

## 2017-12-30 LAB — DIFFERENTIAL
Abs Immature Granulocytes: 0.1 10*3/uL (ref 0.0–0.1)
Basophils Absolute: 0 10*3/uL (ref 0.0–0.1)
Basophils Relative: 0 %
Eosinophils Absolute: 0.2 10*3/uL (ref 0.0–0.7)
Eosinophils Relative: 2 %
Immature Granulocytes: 1 %
Lymphocytes Relative: 21 %
Lymphs Abs: 1.6 10*3/uL (ref 0.7–4.0)
Monocytes Absolute: 1 10*3/uL (ref 0.1–1.0)
Monocytes Relative: 13 %
Neutro Abs: 4.7 10*3/uL (ref 1.7–7.7)
Neutrophils Relative %: 63 %

## 2017-12-30 LAB — I-STAT CHEM 8, ED
BUN: 20 mg/dL (ref 6–20)
Calcium, Ion: 1.53 mmol/L (ref 1.15–1.40)
Chloride: 99 mmol/L — ABNORMAL LOW (ref 101–111)
Creatinine, Ser: 0.7 mg/dL (ref 0.44–1.00)
Glucose, Bld: 107 mg/dL — ABNORMAL HIGH (ref 65–99)
HCT: 45 % (ref 36.0–46.0)
Hemoglobin: 15.3 g/dL — ABNORMAL HIGH (ref 12.0–15.0)
Potassium: 4.3 mmol/L (ref 3.5–5.1)
Sodium: 136 mmol/L (ref 135–145)
TCO2: 26 mmol/L (ref 22–32)

## 2017-12-30 LAB — CBC
HCT: 44.7 % (ref 36.0–46.0)
Hemoglobin: 14.8 g/dL (ref 12.0–15.0)
MCH: 31.3 pg (ref 26.0–34.0)
MCHC: 33.1 g/dL (ref 30.0–36.0)
MCV: 94.5 fL (ref 78.0–100.0)
Platelets: 300 10*3/uL (ref 150–400)
RBC: 4.73 MIL/uL (ref 3.87–5.11)
RDW: 13.2 % (ref 11.5–15.5)
WBC: 7.7 10*3/uL (ref 4.0–10.5)

## 2017-12-30 LAB — COMPREHENSIVE METABOLIC PANEL
ALT: 22 U/L (ref 14–54)
AST: 25 U/L (ref 15–41)
Albumin: 3.9 g/dL (ref 3.5–5.0)
Alkaline Phosphatase: 58 U/L (ref 38–126)
Anion gap: 10 (ref 5–15)
BUN: 18 mg/dL (ref 6–20)
CO2: 26 mmol/L (ref 22–32)
Calcium: 11.4 mg/dL — ABNORMAL HIGH (ref 8.9–10.3)
Chloride: 101 mmol/L (ref 101–111)
Creatinine, Ser: 0.8 mg/dL (ref 0.44–1.00)
GFR calc Af Amer: >60 mL/min (ref >60)
GFR calc non Af Amer: >60 mL/min (ref >60)
Glucose, Bld: 106 mg/dL — ABNORMAL HIGH (ref 65–99)
Potassium: 4.6 mmol/L (ref 3.5–5.1)
Sodium: 137 mmol/L (ref 135–145)
Total Bilirubin: 0.5 mg/dL (ref 0.3–1.2)
Total Protein: 7.1 g/dL (ref 6.5–8.1)

## 2017-12-30 LAB — I-STAT TROPONIN, ED: Troponin i, poc: 0 ng/mL (ref 0.00–0.08)

## 2017-12-30 LAB — RAPID URINE DRUG SCREEN, HOSP PERFORMED
Amphetamines: NOT DETECTED
Barbiturates: NOT DETECTED
Benzodiazepines: POSITIVE — AB
Cocaine: NOT DETECTED
Opiates: NOT DETECTED
Tetrahydrocannabinol: NOT DETECTED

## 2017-12-30 LAB — MRSA PCR SCREENING: MRSA by PCR: NEGATIVE

## 2017-12-30 LAB — APTT: aPTT: 27 seconds (ref 24–36)

## 2017-12-30 LAB — PROTIME-INR
INR: 0.99
Prothrombin Time: 13 seconds (ref 11.4–15.2)

## 2017-12-30 LAB — ETHANOL: Alcohol, Ethyl (B): <10 mg/dL (ref <10)

## 2017-12-30 MED ORDER — NICARDIPINE HCL IN NACL 20-0.86 MG/200ML-% IV SOLN
0.0000 mg/h | INTRAVENOUS | Status: DC
Start: 2017-12-30 — End: 2017-12-31
  Administered 2017-12-30: 5 mg/h via INTRAVENOUS
  Filled 2017-12-30: qty 200

## 2017-12-30 MED ORDER — MIDAZOLAM HCL 2 MG/2ML IJ SOLN
INTRAMUSCULAR | Status: AC
  Filled 2017-12-30: qty 2

## 2017-12-30 MED ORDER — IOPAMIDOL (ISOVUE-300) INJECTION 61%
INTRAVENOUS | Status: AC
  Administered 2017-12-30: 30 mL
  Filled 2017-12-30: qty 50

## 2017-12-30 MED ORDER — MIDAZOLAM HCL 2 MG/2ML IJ SOLN
INTRAMUSCULAR | Status: AC | PRN
  Administered 2017-12-30: 1 mg via INTRAVENOUS

## 2017-12-30 MED ORDER — IOPAMIDOL (ISOVUE-370) INJECTION 76%
50.0000 mL | Freq: Once | INTRAVENOUS | Status: AC | PRN
  Administered 2017-12-30: 50 mL via INTRAVENOUS

## 2017-12-30 MED ORDER — FENTANYL CITRATE (PF) 100 MCG/2ML IJ SOLN
INTRAMUSCULAR | Status: AC
  Filled 2017-12-30: qty 2

## 2017-12-30 MED ORDER — LIDOCAINE HCL 1 % IJ SOLN
INTRAMUSCULAR | Status: AC | PRN
  Administered 2017-12-30: 10 mL

## 2017-12-30 MED ORDER — SODIUM CHLORIDE 0.9 % IV SOLN: INTRAVENOUS | Status: DC

## 2017-12-30 MED ORDER — LIDOCAINE HCL 1 % IJ SOLN
INTRAMUSCULAR | Status: AC
  Filled 2017-12-30: qty 20

## 2017-12-30 MED ORDER — HEPARIN SODIUM (PORCINE) 1000 UNIT/ML IJ SOLN
INTRAMUSCULAR | Status: AC | PRN
  Administered 2017-12-30: 1000 [IU] via INTRAVENOUS

## 2017-12-30 MED ORDER — HEPARIN SODIUM (PORCINE) 1000 UNIT/ML IJ SOLN
INTRAMUSCULAR | Status: AC
  Filled 2017-12-30: qty 1

## 2017-12-30 MED ORDER — SENNOSIDES-DOCUSATE SODIUM 8.6-50 MG PO TABS: 1.0000 | ORAL_TABLET | Freq: Every evening | ORAL | Status: DC | PRN

## 2017-12-30 MED ORDER — STROKE: EARLY STAGES OF RECOVERY BOOK
Freq: Once | Status: DC
  Filled 2017-12-30: qty 1

## 2017-12-30 MED ORDER — ACETAMINOPHEN 325 MG PO TABS: 650.0000 mg | ORAL_TABLET | ORAL | Status: DC | PRN

## 2017-12-30 MED ORDER — FENTANYL CITRATE (PF) 100 MCG/2ML IJ SOLN
INTRAMUSCULAR | Status: AC | PRN
  Administered 2017-12-30: 25 ug via INTRAVENOUS

## 2017-12-30 MED ORDER — NITROGLYCERIN 1 MG/10 ML FOR IR/CATH LAB
INTRA_ARTERIAL | Status: AC
  Filled 2017-12-30: qty 10

## 2017-12-30 MED ORDER — LABETALOL HCL 5 MG/ML IV SOLN: 20.0000 mg | Freq: Once | INTRAVENOUS | Status: DC

## 2017-12-30 MED ORDER — IOHEXOL 300 MG/ML  SOLN
150.0000 mL | Freq: Once | INTRAMUSCULAR | Status: AC | PRN
Start: 2017-12-30 — End: 2017-12-30
  Administered 2017-12-30: 50 mL via INTRA_ARTERIAL

## 2017-12-30 MED ORDER — IOPAMIDOL (ISOVUE-370) INJECTION 76%
INTRAVENOUS | Status: AC
  Filled 2017-12-30: qty 50

## 2017-12-30 MED ORDER — ACETAMINOPHEN 160 MG/5ML PO SOLN: 650.0000 mg | ORAL | Status: DC | PRN

## 2017-12-30 MED ORDER — ACETAMINOPHEN 650 MG RE SUPP: 650.0000 mg | RECTAL | Status: DC | PRN

## 2017-12-30 NOTE — ED Triage Notes (Signed)
Pt arrives via EMS as a code stroke. [place] and at approx 1530 , pt with L arm drift, L facial droop and dysarthria. symptoms spontaneously resolved at approx 1645. 160/80, HR 72, RR 16, 99% RA. CBG 131.

## 2017-12-30 NOTE — Progress Notes (Signed)
Cerebral arteriogram

## 2017-12-30 NOTE — Progress Notes (Signed)
Pt pressure fluctuating and Cardene drip coming off and on. Verbal orders from [place] to keep BP 140-160

## 2017-12-30 NOTE — Progress Notes (Addendum)
Pt has MRI scheduled, informed MRI tech that pt has permanent pacemaker, MRI tech says that MRI is unsafe for pts with pacemakers. Will notify MD for further actions   MD verbal order for no scan tonight and for rounding team in AM to determine need for follow up scan

## 2017-12-30 NOTE — ED Notes (Signed)
Pt going to IR at this time

## 2017-12-30 NOTE — Code Documentation (Addendum)
83 yo female coming from airport where she was waiting on her flight to take off. Pt was sitting when she was noted to have an sudden onset at 1530 of left facial droop, left arm weakness, and slurred speech per husband. EMS was called and activated a Code Stroke. During transport, pt's left weakness, facial droop, and slurred speech resolved. Stroke Team met patient upon arrival Initial NIHSS 1 due to some neglect of the left side. All other symptoms have resolved. No tPA due to too mild to treat. CTA completed. Pt will be inside the tPA window until 1930. Handoff given to Crystal, RN. Pt has hx of atrial fibrillation.   MD Arora Spoke with MD Deveshwar after CTA completed. Decided to do diagnostic angio and patient signed consent. IR notified and patient taken over to IR. Handoff given to Sara, RN.  

## 2017-12-30 NOTE — Procedures (Signed)
S/P 4 vessel cerebral arteriogram RT CFA approach. Findings. 1.Approx 60 % stenosis RT ICA prox . 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

## 2017-12-30 NOTE — ED Provider Notes (Signed)
Emergency Department Provider Note   I have reviewed the triage vital signs and the nursing notes.   HISTORY  Chief Complaint Code Stroke   HPI Brittany [name] is a 84 y.o. female with PMH of a-fib, GERD, HLD, and HTN presents to the emergency department for evaluation after experiencing acute onset left arm weakness and left face droop.  The patient had some associated dysarthria.  She was last seen normal at 15:30 today while [place] the airport.  Symptoms have improved in route.   Level 5 caveat: Acute CVA with high medical acuity.   Past Medical History:  Diagnosis Date  . Ankle fracture 2016  . Atrial fibrillation ([place])   . Depression   . Elevated parathyroid hormone   . Fracture of orbital floor with routine healing   . GERD (gastroesophageal reflux disease)   . History of colon polyps    benign  . Hypercalcemia   . Hypertension   . Hyponatremia   . Hypothyroid   . Macular degeneration    wet in the right and dry in the left  . OSA on CPAP   . Osteoporosis   . Peripheral vascular disease ([place])   . PSVT (paroxysmal supraventricular tachycardia) ([place])   . Rotator cuff tear   . Sinus node dysfunction ([facility])    a. s/p MDT pacemaker  . Urinary incontinence   . Wrist fracture 2018    Patient Active Problem List   Diagnosis Date Noted  . Stroke (cerebrum) ([place]) 12/30/2017  . Dizziness 06/06/2017  . Overactive bladder 06/06/2017  . Abnormal mammogram 05/01/2017  . Hyperparathyroidism ([place]) 02/12/2017  . Vitamin D deficiency 08/14/2016  . Osteoporosis 08/14/2016  . Hyperlipidemia 08/14/2016  . Atherosclerosis of coronary artery 01/02/2016  . Hypercalcemia 03/29/2015  . Multiple pulmonary nodules 12/30/2013  . Lumbar spinal stenosis 12/22/2013  . Arthropathy of facet joint 10/08/2013  . Atrial fibrillation ([place]) 03/04/2013  . Sinus node dysfunction ([place]) 03/04/2013  . Pacemaker-Medtronic 03/04/2013  . OSA on CPAP 02/23/2013  . Anxiety and depression  02/23/2013  . Chronic gastritis 02/23/2013  . Essential hypertension, benign 02/23/2013  . Urinary incontinence 02/23/2013    Past Surgical History:  Procedure Laterality Date  . BACK SURGERY    . CATARACT EXTRACTION    . CATARACT EXTRACTION W/ INTRAOCULAR LENS  IMPLANT, BILATERAL Bilateral   . COLONOSCOPY    . ESOPHAGOGASTRODUODENOSCOPY    . LAPAROSCOPIC CHOLECYSTECTOMY  08/11/1999  . LUMBAR DISC SURGERY  02/2014  . ORIF ANKLE FRACTURE Left 04/13/2015   Procedure: OPEN REDUCTION INTERNAL FIXATION (ORIF) LEFT ANKLE FRACTURE;  Surgeon: Meredith Pel, MD;  Location: [facility];  Service: Orthopedics;  Laterality: Left;  . PACEMAKER PLACEMENT Right 2012   a. MDT dual chamber PPM implanted by Dr Caryl Comes   . SHOULDER ARTHROSCOPY W/ ROTATOR CUFF REPAIR Right 08/30/2014   WITH MINI-OPEN ROTATOR CUFF REPAIR AND SUBACROMIAL DECOMPRESSION  . SHOULDER ARTHROSCOPY WITH ROTATOR CUFF REPAIR AND SUBACROMIAL DECOMPRESSION Right 08/30/2014   Procedure: SHOULDER ARTHROSCOPY WITH MINI-OPEN ROTATOR CUFF REPAIR AND SUBACROMIAL DECOMPRESSION;  Surgeon: Meredith Pel, MD;  Location: [facility];  Service: Orthopedics;  Laterality: Right;  RIGHT SHOULDER DIAGNOSTIC OPERATIVE ARTHROSCOPY, SUBACROMIAL DECOMPRESSION, MINI-OPEN ROTATOR CUFF REPAIR.  Marland Kitchen VAGINAL HYSTERECTOMY  1970      Allergies Adhesive [tape]; Benzalkonium chloride; and Neosporin [neomycin-polymyxin-gramicidin]  Family History  Problem Relation Age of Onset  . Colon cancer Mother        colon  . Cancer Father  unknown type  . Stroke Sister   . Skin cancer Brother   . Heart disease Brother   . Breast cancer Paternal Aunt   . Asthma Brother     Social History Social History   Tobacco Use  . Smoking status: Former Smoker    Packs/day: 1.00    Years: 45.00    Pack years: 45.00    Types: Cigarettes  . Smokeless tobacco: Never Used  . Tobacco comment: quit smoking in 1992  Substance Use Topics  . Alcohol use: Yes    Alcohol/week:  16.8 oz    Types: 28 Glasses of wine per week    Comment: 08/30/2014 "16oz wine q night"  . Drug use: No    Review of Systems  Constitutional: No fever/chills Eyes: No visual changes. ENT: No sore throat. Cardiovascular: Denies chest pain. Respiratory: Denies shortness of breath. Gastrointestinal: No abdominal pain.  No nausea, no vomiting.  No diarrhea.  No constipation. Genitourinary: Negative for dysuria. Musculoskeletal: Negative for back pain. Skin: Negative for rash. Neurological: Negative for headaches. Positive stroke like symptoms.   10-point ROS otherwise negative.  ____________________________________________   PHYSICAL EXAM:  VITAL SIGNS: ED Triage Vitals  Enc Vitals Group     BP 12/30/17 1700 (!) 171/83     Pulse Rate 12/30/17 1700 72     Resp 12/30/17 1700 17     Temp 12/30/17 1704 98.6 F (37 C)     Temp Source 12/30/17 1704 Oral     SpO2 12/30/17 1700 97 %     Weight 12/30/17 1705 190 lb (86.2 kg)     Height 12/30/17 1705 5\' 8"  (1.727 m)     Pain Score 12/30/17 1705 0   Constitutional: Alert and oriented. Well appearing and in no acute distress. Eyes: Conjunctivae are normal. PERRL. Head: Atraumatic. Nose: No congestion/rhinnorhea. Mouth/Throat: Mucous membranes are moist.  Neck: No stridor.   Cardiovascular: Good peripheral circulation. Respiratory: Normal respiratory effort.  Gastrointestinal: No distention.  Musculoskeletal: No gross deformities of extremities. Neurologic:  Normal speech and language. No gross focal neurologic deficits are appreciated.  Skin:  Skin is warm, dry and intact. No rash noted.  ____________________________________________   LABS (all labs ordered are listed, but only abnormal results are displayed)  Labs Reviewed  COMPREHENSIVE METABOLIC PANEL - Abnormal; Notable for the following components:      Result Value   Glucose, Bld 106 (*)    Calcium 11.4 (*)    All other components within normal limits  RAPID URINE  DRUG SCREEN, HOSP PERFORMED - Abnormal; Notable for the following components:   Benzodiazepines POSITIVE (*)    All other components within normal limits  URINALYSIS, ROUTINE W REFLEX MICROSCOPIC - Abnormal; Notable for the following components:   Color, Urine STRAW (*)    Specific Gravity, Urine 1.031 (*)    Hgb urine dipstick MODERATE (*)    All other components within normal limits  LIPID PANEL - Abnormal; Notable for the following components:   Cholesterol 218 (*)    LDL Cholesterol 123 (*)    All other components within normal limits  I-STAT CHEM 8, ED - Abnormal; Notable for the following components:   Chloride 99 (*)    Glucose, Bld 107 (*)    Calcium, Ion 1.53 (*)    Hemoglobin 15.3 (*)    All other components within normal limits  MRSA PCR SCREENING  ETHANOL  PROTIME-INR  APTT  CBC  DIFFERENTIAL  HEMOGLOBIN A1C  I-STAT TROPONIN,  ED   ____________________________________________  EKG   EKG Interpretation  Date/Time:  Tuesday December 30 2017 17:02:26 EDT Ventricular Rate:  68 PR Interval:    QRS Duration: 106 QT Interval:  419 QTC Calculation: 446 R Axis:   54 Text Interpretation:  ATRIAL PACED RHYTHM Prolonged PR interval Anterior infarct, old No STEMI.  Confirmed by Nanda Quinton [phone]) on 12/30/2017 5:07:47 PM Also confirmed by Nanda Quinton [phone]), editor Hattie Perch ([id number])  on 12/31/2017 7:09:32 AM       ____________________________________________  RADIOLOGY  Ct Angio Head W Or Wo Contrast  Result Date: 12/30/2017 CLINICAL DATA:  Left facial droop, left arm drift, and dysarthria. EXAM: CT ANGIOGRAPHY HEAD AND NECK TECHNIQUE: Multidetector CT imaging of the head and neck was performed using the standard protocol during bolus administration of intravenous contrast. Multiplanar CT image reconstructions and MIPs were obtained to evaluate the vascular anatomy. Carotid stenosis measurements (when applicable) are obtained utilizing NASCET criteria, using  the distal internal carotid diameter as the denominator. CONTRAST:  55mL ISOVUE-370 IOPAMIDOL (ISOVUE-370) INJECTION 76% COMPARISON:  None. FINDINGS: CTA NECK FINDINGS Aortic arch: Standard 3 vessel aortic arch. Nonstenotic calcified plaque at the left subclavian artery origin. Right carotid system: Patent with extensive, heavily calcified plaque about the carotid bifurcation resulting in 70% proximal ICA stenosis and approximately 50% distal common carotid artery stenosis. Left carotid system: Patent with scattered plaque in the proximal and mid common carotid artery and more extensive calcified plaque about the carotid bifurcation not resulting in significant stenosis. Vertebral arteries: Patent and codominant with scattered nonstenotic plaque on the left. Calcified plaque at the right vertebral artery origin results in severe stenosis. Skeleton: Advanced cervical disc and facet degeneration. Other neck: Subcentimeter low-density thyroid nodules bilaterally. Upper chest: Clear lung apices. Review of the MIP images confirms the above findings CTA HEAD FINDINGS Anterior circulation: The internal carotid arteries are patent from skull base to carotid termini with mild siphon atherosclerosis not resulting in significant stenosis. ACAs and MCAs are patent proximally without significant proximal stenosis, however there is a distal right M2 inferior division branch occlusion. No aneurysm is identified. Posterior circulation: The intracranial vertebral arteries are patent with focal calcified left V4 segment plaque not resulting in a significant stenosis. Patent left PICA, right AICA, and bilateral SCA origins are identified. The basilar artery is widely patent. There is a diminutive left posterior communicating artery. The PCAs are patent without evidence of significant stenosis. No aneurysm is identified. Venous sinuses: Hypoplastic and poorly visualized left transverse and sigmoid sinuses. Other major dural venous  sinuses are patent. Anatomic variants: None of significance. Delayed phase: Not performed. Review of the MIP images confirms the above findings IMPRESSION: 1. Distal right M2 branch occlusion. 2. Heavily calcified plaque about both carotid bifurcations with 70% proximal right ICA stenosis. 3. Severe right vertebral artery origin stenosis. These results were called by telephone at the time of interpretation on 12/30/2017 at 5:18 pm to Dr. Rory Percy, who verbally acknowledged these results. Electronically Signed   By: Logan Bores M.D.   On: 12/30/2017 17:39   Ct Angio Neck W Or Wo Contrast  Result Date: 12/30/2017 CLINICAL DATA:  Left facial droop, left arm drift, and dysarthria. EXAM: CT ANGIOGRAPHY HEAD AND NECK TECHNIQUE: Multidetector CT imaging of the head and neck was performed using the standard protocol during bolus administration of intravenous contrast. Multiplanar CT image reconstructions and MIPs were obtained to evaluate the vascular anatomy. Carotid stenosis measurements (when applicable) are obtained utilizing NASCET criteria,  using the distal internal carotid diameter as the denominator. CONTRAST:  81mL ISOVUE-370 IOPAMIDOL (ISOVUE-370) INJECTION 76% COMPARISON:  None. FINDINGS: CTA NECK FINDINGS Aortic arch: Standard 3 vessel aortic arch. Nonstenotic calcified plaque at the left subclavian artery origin. Right carotid system: Patent with extensive, heavily calcified plaque about the carotid bifurcation resulting in 70% proximal ICA stenosis and approximately 50% distal common carotid artery stenosis. Left carotid system: Patent with scattered plaque in the proximal and mid common carotid artery and more extensive calcified plaque about the carotid bifurcation not resulting in significant stenosis. Vertebral arteries: Patent and codominant with scattered nonstenotic plaque on the left. Calcified plaque at the right vertebral artery origin results in severe stenosis. Skeleton: Advanced cervical disc and  facet degeneration. Other neck: Subcentimeter low-density thyroid nodules bilaterally. Upper chest: Clear lung apices. Review of the MIP images confirms the above findings CTA HEAD FINDINGS Anterior circulation: The internal carotid arteries are patent from skull base to carotid termini with mild siphon atherosclerosis not resulting in significant stenosis. ACAs and MCAs are patent proximally without significant proximal stenosis, however there is a distal right M2 inferior division branch occlusion. No aneurysm is identified. Posterior circulation: The intracranial vertebral arteries are patent with focal calcified left V4 segment plaque not resulting in a significant stenosis. Patent left PICA, right AICA, and bilateral SCA origins are identified. The basilar artery is widely patent. There is a diminutive left posterior communicating artery. The PCAs are patent without evidence of significant stenosis. No aneurysm is identified. Venous sinuses: Hypoplastic and poorly visualized left transverse and sigmoid sinuses. Other major dural venous sinuses are patent. Anatomic variants: None of significance. Delayed phase: Not performed. Review of the MIP images confirms the above findings IMPRESSION: 1. Distal right M2 branch occlusion. 2. Heavily calcified plaque about both carotid bifurcations with 70% proximal right ICA stenosis. 3. Severe right vertebral artery origin stenosis. These results were called by telephone at the time of interpretation on 12/30/2017 at 5:18 pm to Dr. Rory Percy, who verbally acknowledged these results. Electronically Signed   By: Logan Bores M.D.   On: 12/30/2017 17:39   Ct Head Code Stroke Wo Contrast  Result Date: 12/30/2017 CLINICAL DATA:  Code stroke.  Left facial droop EXAM: CT HEAD WITHOUT CONTRAST TECHNIQUE: Contiguous axial images were obtained from the base of the skull through the vertex without intravenous contrast. COMPARISON:  CT head 08/17/2013 FINDINGS: Brain: Moderate atrophy  unchanged. Moderate chronic microvascular ischemic change in the white matter stable. Negative for acute infarct. Negative for acute hemorrhage or mass. No fluid collection or midline shift. Vascular: Negative for hyperdense vessel Skull: Negative Sinuses/Orbits: Mucosal edema paranasal sinuses. Bilateral cataract surgery Other: None ASPECTS ([place] Stroke Program Early CT Score) - Ganglionic level infarction (caudate, lentiform nuclei, internal capsule, insula, M1-M3 cortex): 7 - Supraganglionic infarction (M4-M6 cortex): 3 Total score (0-10 with 10 being normal): 10 IMPRESSION: 1. No acute abnormality.  No change from the prior study. 2. ASPECTS is 10 3. Moderate atrophy and moderate chronic microvascular ischemia in the white matter. 4. These results were called by telephone at the time of interpretation on 12/30/2017 at 4:49 pm to Dr. Rory Percy , who verbally acknowledged these results. Electronically Signed   By: Franchot Gallo M.D.   On: 12/30/2017 16:49    ____________________________________________   PROCEDURES  Procedure(s) performed:   Procedures  None ____________________________________________   INITIAL IMPRESSION / ASSESSMENT AND PLAN / ED COURSE  Pertinent labs & imaging results that were available during my care  of the patient were reviewed by me and considered in my medical decision making (see chart for details).  Patient presents to the emergency department for evaluation as a code stroke.  She was evaluated by neurology upon arrival.  Symptoms have improved but continues to have some left lower extremity neglect.  Dr. Malen Gauze is at bedside and has elected to take the patient to interventional radiology after review of her CT head and angio.   Discussed patient's case with Neurology Dr. Malen Gauze to request admission. Patient and family (if present) updated with plan. Care transferred to Neurology service.  I reviewed all nursing notes, vitals, pertinent old records, EKGs, labs,  imaging (as available).  ____________________________________________  FINAL CLINICAL IMPRESSION(S) / ED DIAGNOSES  Final diagnoses:  Acute ischemic stroke ([place])     MEDICATIONS GIVEN DURING THIS VISIT:  Medications  iopamidol (ISOVUE-370) 76 % injection (has no administration in time range)   stroke: mapping our early stages of recovery book (has no administration in time range)  0.9 %  sodium chloride infusion (has no administration in time range)  acetaminophen (TYLENOL) tablet 650 mg (has no administration in time range)    Or  acetaminophen (TYLENOL) solution 650 mg (has no administration in time range)    Or  acetaminophen (TYLENOL) suppository 650 mg (has no administration in time range)  senna-docusate (Senokot-S) tablet 1 tablet (has no administration in time range)  labetalol (NORMODYNE,TRANDATE) injection 20 mg (has no administration in time range)    And  nicardipine (CARDENE) 20mg  in 0.86% saline 248ml IV infusion (0.1 mg/ml) (0 mg/hr Intravenous Stopped 12/31/17 0446)  lidocaine (XYLOCAINE) 1 % (with pres) injection (has no administration in time range)  fentaNYL (SUBLIMAZE) 100 MCG/2ML injection (has no administration in time range)  midazolam (VERSED) 2 MG/2ML injection (has no administration in time range)  heparin 1000 UNIT/ML injection (has no administration in time range)  iopamidol (ISOVUE-370) 76 % injection 50 mL (50 mLs Intravenous Contrast Given 12/30/17 1648)  midazolam (VERSED) injection (1 mg Intravenous Given 12/30/17 1752)  fentaNYL (SUBLIMAZE) injection (25 mcg Intravenous Given 12/30/17 1752)  heparin injection (1,000 Units Intravenous Given 12/30/17 1755)  iopamidol (ISOVUE-300) 61 % injection (30 mLs  Contrast Given 12/30/17 1827)  lidocaine (XYLOCAINE) 1 % (with pres) injection (10 mLs Infiltration Given 12/30/17 1827)  iohexol (OMNIPAQUE) 300 MG/ML solution 150 mL (50 mLs Intra-arterial Contrast Given 12/30/17 1837)    Note:  This document was  prepared using Dragon voice recognition software and may include unintentional dictation errors.  Nanda Quinton, MD Emergency Medicine   Azka Steger, [facility], MD 12/31/17 [phone]

## 2017-12-30 NOTE — H&P (Addendum)
NEURO HOSPITALIST  H&P     Requesting Physician: Dr. Laverta Baltimore    Chief Complaint: Left sided arm weakness/ numbness/ left facial droop  History obtained from:  Patient / husband  HPI:                                                                                                                                         Aleeta Schmaltz is an 84 y.o. female with PMH significant for HTN, afib (controlled with pacemaker), PVD presents to [facility] as a code stroke.  Patient was normal when she woke up this morning. Her and her husband went to the airport to catch a flight out of town and at [employer] she had a sudden onset of left arm weakness and numbness, with slurred speech and  Left facial droop. Per EMS these symptoms lasted about 40 minutes. They had resolved by the time she arrived at [facility]. Denies SOB, CP,  vision problems , or HA. Per patient she has never been on any anticoagulation therapy because she refused. BG per EMS was 131 and BP 170/90 on arrival at [facility].  Date last known well: Date: 12/30/2017 Time last known well: Time: 15:30 tPA Given: No: contraindicated Modified Rankin: Rankin Score=1 NIHSS: 1 ; extinction  Past Medical History:  Diagnosis Date  . Ankle fracture 2016  . Atrial fibrillation ([place])   . Depression   . Elevated parathyroid hormone   . Fracture of orbital floor with routine healing   . GERD (gastroesophageal reflux disease)   . History of colon polyps    benign  . Hypercalcemia   . Hypertension   . Hyponatremia   . Hypothyroid   . Macular degeneration    wet in the right and dry in the left  . OSA on CPAP   . Osteoporosis   . Peripheral vascular disease ([place])   . PSVT (paroxysmal supraventricular tachycardia) ([place])   . Rotator cuff tear   . Sinus node dysfunction ([facility])    a. s/p MDT pacemaker  . Urinary incontinence   . Wrist fracture 2018    Past Surgical History:  Procedure Laterality Date  . BACK SURGERY     . CATARACT EXTRACTION    . CATARACT EXTRACTION W/ INTRAOCULAR LENS  IMPLANT, BILATERAL Bilateral   . COLONOSCOPY    . ESOPHAGOGASTRODUODENOSCOPY    . LAPAROSCOPIC CHOLECYSTECTOMY  08/11/1999  . LUMBAR DISC SURGERY  02/2014  . ORIF ANKLE FRACTURE Left 04/13/2015   Procedure: OPEN REDUCTION INTERNAL FIXATION (ORIF) LEFT ANKLE FRACTURE;  Surgeon: Meredith Pel, MD;  Location: [place];  Service: Orthopedics;  Laterality: Left;  .  PACEMAKER PLACEMENT Right 2012   a. MDT dual chamber PPM implanted by Dr Caryl Comes   . SHOULDER ARTHROSCOPY W/ ROTATOR CUFF REPAIR Right 08/30/2014   WITH MINI-OPEN ROTATOR CUFF REPAIR AND SUBACROMIAL DECOMPRESSION  . SHOULDER ARTHROSCOPY WITH ROTATOR CUFF REPAIR AND SUBACROMIAL DECOMPRESSION Right 08/30/2014   Procedure: SHOULDER ARTHROSCOPY WITH MINI-OPEN ROTATOR CUFF REPAIR AND SUBACROMIAL DECOMPRESSION;  Surgeon: Meredith Pel, MD;  Location: [place];  Service: Orthopedics;  Laterality: Right;  RIGHT SHOULDER DIAGNOSTIC OPERATIVE ARTHROSCOPY, SUBACROMIAL DECOMPRESSION, MINI-OPEN ROTATOR CUFF REPAIR.  Marland Kitchen VAGINAL HYSTERECTOMY  1970    Family History  Problem Relation Age of Onset  . Colon cancer Mother        colon  . Cancer Father        unknown type  . Stroke Sister   . Skin cancer Brother   . Heart disease Brother   . Breast cancer Paternal Aunt   . Asthma Brother      Social History:  reports that she has quit smoking. Her smoking use included cigarettes. She has a 45.00 pack-year smoking history. She has never used smokeless tobacco. She reports that she drinks about 16.8 oz of alcohol per week. She reports that she does not use drugs.  Allergies:  Allergies  Allergen Reactions  . Adhesive [Tape] Swelling and Rash  . Benzalkonium Chloride Rash  . Neosporin [Neomycin-Polymyxin-Gramicidin] Swelling and Rash    Medications:                                                                                                                           Current  Facility-Administered Medications  Medication Dose Route Frequency Provider Last Rate Last Dose  . iopamidol (ISOVUE-370) 76 % injection            Current Outpatient Medications  Medication Sig Dispense Refill  . alendronate (FOSAMAX) 70 MG tablet Take 1 tablet (70 mg total) by mouth every 7 (seven) days. Take with a full glass of water on an empty stomach. 4 tablet 11  . amLODipine (NORVASC) 10 MG tablet Take 1 tablet (10 mg total) by mouth daily. 90 tablet 1  . aspirin EC 81 MG tablet Take 1 tablet (81 mg total) by mouth daily.    . B Complex-C (B-COMPLEX WITH VITAMIN C) tablet Take 1 tablet by mouth daily.    . Cholecalciferol (VITAMIN D3) 5000 UNITS CAPS Take 1 Can by mouth daily.    . citalopram (CELEXA) 20 MG tablet TAKE 1 TABLET (20 MG TOTAL) BY MOUTH DAILY. 90 tablet 1  . lisinopril (PRINIVIL,ZESTRIL) 20 MG tablet Take 1 tablet (20 mg total) by mouth daily. 90 tablet 1  . Lutein 20 MG TABS Take by mouth.    . magnesium oxide (MAG-OX) 400 MG tablet Take 1 tablet (400 mg total) by mouth daily. 30 tablet 0  . Multiple Vitamin (MULTIVITAMIN WITH MINERALS) TABS tablet Take 1 tablet by mouth daily.    . Multiple Vitamins-Minerals (PRESERVISION AREDS  PO) Take 1 tablet by mouth 2 (two) times daily.    . sotalol (BETAPACE) 80 MG tablet TAKE 1 AND 1/2 TABLETS BY MOUTH TWICE A DAY. Please make yearly appt with Dr. Caryl Comes for May. 1st attempt 270 tablet 0     ROS:                                                                                                                                       History obtained from the patient  General ROS: negative for - chills, fatigue, fever, night sweats, weight gain or weight loss Psychological ROS: negative for - , hallucinations, memory difficulties, mood swings or  Ophthalmic ROS: negative for - blurry vision, double vision, eye pain or loss of vision ENT ROS: negative for - epistaxis, nasal discharge, oral lesions, sore throat, tinnitus or  vertigo Respiratory ROS: negative for - cough,  shortness of breath or wheezing Cardiovascular ROS: negative for - chest pain, dyspnea on exertion,  Gastrointestinal ROS: negative for - abdominal pain, diarrhea,  nausea/vomiting or stool incontinence Genito-Urinary ROS: negative for - dysuria, hematuria, incontinence or urinary frequency/urgency Musculoskeletal ROS: negative for - joint swelling or muscular weakness Neurological ROS: as noted in HPI   General Examination:                                                                                                      Last menstrual period 07/22/1968.  HEENT-  Normocephalic, no lesions, without obvious abnormality.  Normal external eye and conjunctiva.   Cardiovascular- S1-S2 audible, pulses palpable throughout   Lungs no excessive working breathing.  Saturations within normal limits on RA Extremities- Warm, dry and intact Musculoskeletal-no joint tenderness, deformity or swelling, left side arm weakness. Skin-warm and dry, no hyperpigmentation, vitiligo, or suspicious lesions  Neurological Examination Mental Status: Alert, oriented to person/year/ situation. Speech fluent without evidence of aphasia.  Able to follow commands without difficulty. Cranial Nerves: II:  Visual fields grossly normal,  III,IV, VI: ptosis not present, extra-ocular motions intact bilaterally, pupils equal, round, reactive to light and accommodation V,VII: smile symmetric, facial light touch sensation normal bilaterally VIII: hearing normal bilaterally IX,X: uvula rises symmetrically XI: bilateral shoulder shrug XII: midline tongue extension Motor: 5/5 strength b/l Tone and bulk:normal tone throughout; no atrophy noted Sensory:  light touch intact throughout, bilaterally but extinguishes left on double simultaneous stim Deep Tendon Reflexes: 2+ and symmetric throughout Plantars: Right: downgoing   Left: downgoing Cerebellar: normal finger-to-nose,   normal  heel-to-shin test Gait: not tested   Lab Results: Basic Metabolic Panel: Recent Labs  Lab 12/30/17 1649  NA 136  K 4.3  CL 99*  GLUCOSE 107*  BUN 20  CREATININE 0.70    CBC: Recent Labs  Lab 12/30/17 1642 12/30/17 1649  WBC 7.7  --   NEUTROABS 4.7  --   HGB 14.8 15.3*  HCT 44.7 45.0  MCV 94.5  --   PLT 300  --     Assessment and plan discussed with with attending physician and they are in agreement.    Laurey Morale, MSN, NP-C [facility] [phone]  12/30/2017, 4:46 PM   Attending Neurohospitalist Addendum Patient seen and examined with APP/Resident. Agree with the history and physical as documented above. I have independently reviewed the chart, obtained history, review of systems and examined the patient.I have personally reviewed pertinent head/neck/spine imaging (CT/MRI).  CTH Aspects 10. CTA H+N: hight grade RICA stenosis, distal M2 superior div possible clot.     tPA not given due to low stroke scale.  Assessment: 84 y.o. female  female with PMH significant for HTN, afib (controlled with pacemaker), PVD presents to [facility] as a code stroke. Complaints were left sided facial droop, left arm weakness and numbness, and slurred speech. Low stroke scale but neglect on exam was concering. CTA showed findings of distal M2 sup div clot. Discussed with patient and endovascular specialist - Dr Estanislado Pandy. Options of wait and watch to see how she does versus diagnostic angio and probably thrombectomy if occlusion is confirmed. Risks benefits of both approches were discussed with the patient and decision was made to go for diagnostic angio, and thrombectomy if possible even with the low NIHSS. No tPA due to low stroke scale as well as a clot that is retrievable by endovascular therapy.  Stroke Risk Factors - atrial fibrillation and hypertension  Acute Ischemic Stroke Cerebral infarction due to embolism of right middle cerebral artery Acuity:  Acute Current Suspected Etiology: cardioembolic Continue Evaluation:  -Admit to: NICU after IR -Decision on ASA after procedure -Continue Statin -Blood pressure control, goal of SYS <220 if not revascularized, and <140 if revascularized. Decision after IR. -MRI/ECHO/A1C/Lipid panel. -Hyperglycemia management per SSI to maintain glucose 140-180mg /dL. -PT/OT/ST therapies and recommendations when able  CNS -Close neuro monitoring  Dysarthria Dysphagia following cerebral infarction  -NPO until cleared by speech -ST -Advance diet as tolerated  Hemiplegia and hemiparesis following cerebral infarction affecting left non-dominant side  -PT/OT -PM&R consult  RESP No active issues Monitor clinically  CV Essential (primary) hypertension -Aggressive BP control, goal SBP as above  -TTE  Hyperlipidemia, unspecified  - Statin for goal LDL < 70  Chronic atrial fibrillation -Rate control -Will need long term AC  HEME -Monitor -transfuse for hgb < 7  ENDO -goal HgbA1c < 7  GI/GU -Gentle hydration  Fluid/Electrolyte Disorders -Repeat labs -Replete  ID Possible Aspiration PNA  -CXR -NPO -Monitor  Prophylaxis DVT: SCDs for now. Heparin sq should be started if no contraindication after endovascular rx   GI: NA Bowel:  Doc/senna  Diet: NPO until cleared by speech  Code Status: Full Code  -- Amie Portland, MD [facility] Pager: [phone] If 7pm to 7am, please call on call as listed on AMION.  CRITICAL CARE ATTESTATION This patient is critically ill and at significant risk of neurological worsening, death and care requires constant monitoring of vital signs, hemodynamics,respiratory and cardiac monitoring. I spent 60  minutes of neurocritical care time performing neurological assessment,  discussion with family, other specialists and medical decision making of high complexityin the care of  this patient.    ADDENDUM S/p IR - NIHSS 0. Spontaneous  recanalization, no need for intervention, TICI3 flow. SBP goal <160. Other recs as above.  -- Amie Portland, MD [facility] Pager: [phone] If 7pm to 7am, please call on call as listed on AMION.

## 2017-12-31 ENCOUNTER — Encounter (HOSPITAL_COMMUNITY): Payer: Self-pay

## 2017-12-31 ENCOUNTER — Other Ambulatory Visit: Payer: Self-pay

## 2017-12-31 ENCOUNTER — Inpatient Hospital Stay (HOSPITAL_COMMUNITY): Payer: Medicare Other

## 2017-12-31 DIAGNOSIS — I351 Nonrheumatic aortic (valve) insufficiency: Secondary | ICD-10-CM

## 2017-12-31 DIAGNOSIS — I6300 Cerebral infarction due to thrombosis of unspecified precerebral artery: Secondary | ICD-10-CM

## 2017-12-31 LAB — ECHOCARDIOGRAM COMPLETE
Height: 68 in
Weight: 3040 oz

## 2017-12-31 LAB — LIPID PANEL
Cholesterol: 218 mg/dL — ABNORMAL HIGH (ref 0–200)
HDL: 81 mg/dL (ref >40)
LDL Cholesterol: 123 mg/dL — ABNORMAL HIGH (ref 0–99)
Total CHOL/HDL Ratio: 2.7 RATIO
Triglycerides: 70 mg/dL (ref <150)
VLDL: 14 mg/dL (ref 0–40)

## 2017-12-31 LAB — HEMOGLOBIN A1C
Hgb A1c MFr Bld: 5.2 % (ref 4.8–5.6)
Mean Plasma Glucose: 102.54 mg/dL

## 2017-12-31 MED ORDER — SOTALOL HCL 120 MG PO TABS
120.0000 mg | ORAL_TABLET | Freq: Two times a day (BID) | ORAL | Status: DC
  Administered 2017-12-31 – 2018-01-01 (×3): 120 mg via ORAL
  Filled 2017-12-31: qty 2
  Filled 2017-12-31 (×2): qty 1

## 2017-12-31 MED ORDER — ATORVASTATIN CALCIUM 40 MG PO TABS
40.0000 mg | ORAL_TABLET | Freq: Every day | ORAL | Status: DC
  Administered 2017-12-31: 40 mg via ORAL
  Filled 2017-12-31: qty 1

## 2017-12-31 MED ORDER — LISINOPRIL 20 MG PO TABS
20.0000 mg | ORAL_TABLET | Freq: Every day | ORAL | Status: DC
  Administered 2017-12-31 – 2018-01-01 (×2): 20 mg via ORAL
  Filled 2017-12-31 (×2): qty 1

## 2017-12-31 MED ORDER — AMLODIPINE BESYLATE 10 MG PO TABS
10.0000 mg | ORAL_TABLET | Freq: Every day | ORAL | Status: DC
  Administered 2017-12-31 – 2018-01-01 (×2): 10 mg via ORAL
  Filled 2017-12-31 (×2): qty 1

## 2017-12-31 MED ORDER — ASPIRIN 81 MG PO CHEW
81.0000 mg | CHEWABLE_TABLET | Freq: Every day | ORAL | Status: DC
  Administered 2017-12-31 – 2018-01-01 (×2): 81 mg via ORAL
  Filled 2017-12-31 (×2): qty 1

## 2017-12-31 MED ORDER — APIXABAN 5 MG PO TABS
5.0000 mg | ORAL_TABLET | Freq: Two times a day (BID) | ORAL | Status: DC
  Administered 2017-12-31 – 2018-01-01 (×3): 5 mg via ORAL
  Filled 2017-12-31 (×4): qty 1

## 2017-12-31 MED ORDER — MELATONIN 3 MG PO TABS
3.0000 mg | ORAL_TABLET | Freq: Once | ORAL | Status: AC
  Administered 2017-12-31: 3 mg via ORAL
  Filled 2017-12-31 (×2): qty 1

## 2017-12-31 NOTE — Progress Notes (Signed)
Call for request for sleeping medication for patient's for tonight. NIH 0 Melatonin ordered.

## 2017-12-31 NOTE — Progress Notes (Signed)
SLP Cancellation Note  Patient Details Name: Mauri Temkin MRN: [id number] DOB: [birth year]   Cancelled treatment:       Reason Eval/Treat Not Completed: SLP screened, no needs identified, will sign off   Yolanda Huffstetler, [place] Lapier 12/31/2017, 3:16 PM

## 2017-12-31 NOTE — Evaluation (Signed)
Physical Therapy Evaluation/Discharge Patient Details Name: Brittany [name] MRN: [id number] DOB: 11-21-1933 Today's Date: 12/31/2017   History of Present Illness  84 y.o. female s/p stroke with L sided weakness, numbness, L facial droop. PMH significant for HTN, afib (controlled with pacemaker), PVD  Clinical Impression  Pt pleasant and motivated to participate with PT. Pt appears to be functioning at baseline level as she ambulates long distances without instability until tested with higher level balance challenges. While pt is at baseline, pt could benefit from follow up OPPT services to address balance deficits to prevent future falls. Pt educated regarding stroke warning signs and is agreeable to D/C from PT services.    Follow Up Recommendations Outpatient PT(For balance deficits)    Equipment Recommendations  None recommended by PT    Recommendations for Other Services       Precautions / Restrictions Precautions Precautions: None Restrictions Weight Bearing Restrictions: No      Mobility  Bed Mobility               General bed mobility comments: Pt in chair upon arrival  Transfers Overall transfer level: Modified independent Equipment used: None Transfers: Sit to/from Stand Sit to Stand: Modified independent (Device/Increase time)         General transfer comment: Increased time.  Ambulation/Gait Ambulation/Gait assistance: Supervision Ambulation Distance (Feet): 400 Feet Assistive device: None Gait Pattern/deviations: Decreased stride length;Step-through pattern Gait velocity: Mild decrease   General Gait Details: Pt ambulates with BUE behind back as pt reports that it improves her posture. No episodes of instability or LOB during gait but speed diminished with head turns to R and L as well as looking up and down.   Stairs            Wheelchair Mobility    Modified Rankin (Stroke Patients Only) Modified Rankin (Stroke Patients  Only) Pre-Morbid Rankin Score: No symptoms Modified Rankin: No symptoms     Balance Overall balance assessment: Needs assistance                   Tandem Stance - Right Leg: 15 Tandem Stance - Left Leg: 15 Rhomberg - Eyes Opened: 30 Rhomberg - Eyes Closed: 30   High Level Balance Comments: Pt attempted tandem stance with eyes closed and lost balance after ~5 seconds. increased sway also noted with rhomberg eyes closed. Pt able to pick object up off ground safely             Pertinent Vitals/Pain Pain Assessment: No/denies pain    Home Living Family/patient expects to be discharged to:: Private residence(Independent Living) Living Arrangements: Spouse/significant other Available Help at Discharge: Family;Available 24 hours/day Type of Home: Independent living facility Home Access: Level entry     Home Layout: One level Home Equipment: Bedside commode;Cane - single point;Walker - 2 wheels Additional Comments: Pt reports not using any AD PTA    Prior Function Level of Independence: Independent         Comments: Pt reports previous falls due to dizziness and "clumsiness"     Hand Dominance        Extremity/Trunk Assessment   Upper Extremity Assessment Upper Extremity Assessment: Overall WFL for tasks assessed    Lower Extremity Assessment Lower Extremity Assessment: Overall WFL for tasks assessed       Communication   Communication: No difficulties  Cognition Arousal/Alertness: Awake/alert Behavior During Therapy: WFL for tasks assessed/performed Overall Cognitive Status: Within Functional Limits for tasks assessed  General Comments      Exercises     Assessment/Plan    PT Assessment Patent does not need any further PT services  PT Problem List         PT Treatment Interventions      PT Goals (Current goals can be found in the Care Plan section)  Acute Rehab PT Goals Patient  Stated Goal: To return home PT Goal Formulation: With patient Time For Goal Achievement: 01/14/18 Potential to Achieve Goals: Good    Frequency     Barriers to discharge   None    Co-evaluation               AM-PAC PT "6 Clicks" Daily Activity  Outcome Measure Difficulty turning over in bed (including adjusting bedclothes, sheets and blankets)?: None Difficulty moving from lying on back to sitting on the side of the bed? : None Difficulty sitting down on and standing up from a chair with arms (e.g., wheelchair, bedside commode, etc,.)?: None Help needed moving to and from a bed to chair (including a wheelchair)?: None Help needed walking in hospital room?: A Little Help needed climbing 3-5 steps with a railing? : A Little 6 Click Score: 22    End of Session Equipment Utilized During Treatment: Gait belt Activity Tolerance: Patient tolerated treatment well Patient left: with call bell/phone within reach;in chair Nurse Communication: Mobility status PT Visit Diagnosis: Other abnormalities of gait and mobility (R26.89);History of falling (Z91.81)    Time: 4287-6811 PT Time Calculation (min) (ACUTE ONLY): 19 min   Charges:   PT Evaluation $PT Eval Low Complexity: 1 Low     PT G Codes:       Gabe Ronte Parker, SPT  [employer] 12/31/2017, 2:59 PM

## 2017-12-31 NOTE — Progress Notes (Signed)
OT Cancellation Note  Patient Details Name: Sundra Haddix MRN: [id number] DOB: [birth year]   Cancelled Treatment:    Reason Eval/Treat Not Completed: Patient not medically ready(strict bedrest on cardene drip) Please incr activity order as appropriate.  Vonita Moss   OTR/L Pager: [phone] Office: [phone] .  12/31/2017, 8:19 AM

## 2017-12-31 NOTE — Progress Notes (Addendum)
STROKE TEAM PROGRESS NOTE  HPI ( Dr Rory Percy) :Brittany [name] is an 84 y.o. female with PMH significant for HTN, afib (controlled with pacemaker), PVD presents to [facility] as a code stroke.  Patient was normal when she woke up this morning. Her and her husband went to the airport to catch a flight out of town and at [employer] she had a sudden onset of left arm weakness and numbness, with slurred speech and  Left facial droop. Per EMS these symptoms lasted about 40 minutes. They had resolved by the time she arrived at [facility]. Denies SOB, CP,  vision problems , or HA. Per patient she has never been on any anticoagulation therapy because she refused. BG per EMS was 131 and BP 170/90 on arrival at [facility].  Date last known well: Date: 12/30/2017 Time last known well: Time: 15:30 tPA Given: No: contraindicated Modified Rankin: Rankin Score=1 NIHSS: 1 ; extinction    INTERVAL HISTORY Her husband is at the bedside.  She states symptoms started yesterday while at the airport getting ready to leave for [facility]. Today, she feels better. Speech no longer slurred. Mild L facial numbness but no weakness.  Patient made a conscious decision not to take anticoagulation for atrial fibrillation in the past due to knowing people who have had brain bleeds on anticoagulation.  Dr. Leonie Man discussed etiology of stroke along with secondary stroke prevention from anticoagulation.  Patient now hesitant but agreeable to start.  Her husband at the bedside is supportive.Yesterday she had minimal deficits on admission hence IV tPA was not given but CT angiogram showed M2 occlusion hence she was taken for a diagnostic cerebral angiogram but the clot had resolved by the time of the procedure  Vitals:   12/31/17 0500 12/31/17 0530 12/31/17 0600 12/31/17 0800  BP: 130/60 132/65 134/62   Pulse: 60 60 63   Resp: 16 14 14    Temp:    98.7 F (37.1 C)  TempSrc:    Oral  SpO2: 97% 96% 97%   Weight:      Height:        CBC:  Recent Labs  Lab  12/30/17 1642 12/30/17 1649  WBC 7.7  --   NEUTROABS 4.7  --   HGB 14.8 15.3*  HCT 44.7 45.0  MCV 94.5  --   PLT 300  --     Basic Metabolic Panel:  Recent Labs  Lab 12/30/17 1642 12/30/17 1649  NA 137 136  K 4.6 4.3  CL 101 99*  CO2 26  --   GLUCOSE 106* 107*  BUN 18 20  CREATININE 0.80 0.70  CALCIUM 11.4*  --    Lipid Panel:     Component Value Date/Time   CHOL 218 (H) 12/31/2017 0329   TRIG 70 12/31/2017 0329   TRIG 69 10/28/2012 1246   HDL 81 12/31/2017 0329   CHOLHDL 2.7 12/31/2017 0329   VLDL 14 12/31/2017 0329   LDLCALC 123 (H) 12/31/2017 0329   HgbA1c:  Lab Results  Component Value Date   HGBA1C 5.2 12/31/2017   Urine Drug Screen:     Component Value Date/Time   LABOPIA NONE DETECTED 12/30/2017 1633   COCAINSCRNUR NONE DETECTED 12/30/2017 1633   LABBENZ POSITIVE (A) 12/30/2017 1633   AMPHETMU NONE DETECTED 12/30/2017 1633   THCU NONE DETECTED 12/30/2017 1633   LABBARB NONE DETECTED 12/30/2017 1633    Alcohol Level     Component Value Date/Time   ETH <10 12/30/2017 1642    IMAGING  Ct Angio Head W Or Wo Contrast  Result Date: 12/30/2017 CLINICAL DATA:  Left facial droop, left arm drift, and dysarthria. EXAM: CT ANGIOGRAPHY HEAD AND NECK TECHNIQUE: Multidetector CT imaging of the head and neck was performed using the standard protocol during bolus administration of intravenous contrast. Multiplanar CT image reconstructions and MIPs were obtained to evaluate the vascular anatomy. Carotid stenosis measurements (when applicable) are obtained utilizing NASCET criteria, using the distal internal carotid diameter as the denominator. CONTRAST:  63mL ISOVUE-370 IOPAMIDOL (ISOVUE-370) INJECTION 76% COMPARISON:  None. FINDINGS: CTA NECK FINDINGS Aortic arch: Standard 3 vessel aortic arch. Nonstenotic calcified plaque at the left subclavian artery origin. Right carotid system: Patent with extensive, heavily calcified plaque about the carotid bifurcation resulting  in 70% proximal ICA stenosis and approximately 50% distal common carotid artery stenosis. Left carotid system: Patent with scattered plaque in the proximal and mid common carotid artery and more extensive calcified plaque about the carotid bifurcation not resulting in significant stenosis. Vertebral arteries: Patent and codominant with scattered nonstenotic plaque on the left. Calcified plaque at the right vertebral artery origin results in severe stenosis. Skeleton: Advanced cervical disc and facet degeneration. Other neck: Subcentimeter low-density thyroid nodules bilaterally. Upper chest: Clear lung apices. Review of the MIP images confirms the above findings CTA HEAD FINDINGS Anterior circulation: The internal carotid arteries are patent from skull base to carotid termini with mild siphon atherosclerosis not resulting in significant stenosis. ACAs and MCAs are patent proximally without significant proximal stenosis, however there is a distal right M2 inferior division branch occlusion. No aneurysm is identified. Posterior circulation: The intracranial vertebral arteries are patent with focal calcified left V4 segment plaque not resulting in a significant stenosis. Patent left PICA, right AICA, and bilateral SCA origins are identified. The basilar artery is widely patent. There is a diminutive left posterior communicating artery. The PCAs are patent without evidence of significant stenosis. No aneurysm is identified. Venous sinuses: Hypoplastic and poorly visualized left transverse and sigmoid sinuses. Other major dural venous sinuses are patent. Anatomic variants: None of significance. Delayed phase: Not performed. Review of the MIP images confirms the above findings IMPRESSION: 1. Distal right M2 branch occlusion. 2. Heavily calcified plaque about both carotid bifurcations with 70% proximal right ICA stenosis. 3. Severe right vertebral artery origin stenosis. These results were called by telephone at the time  of interpretation on 12/30/2017 at 5:18 pm to Dr. Rory Percy, who verbally acknowledged these results. Electronically Signed   By: Logan Bores M.D.   On: 12/30/2017 17:39   Ct Angio Neck W Or Wo Contrast  Result Date: 12/30/2017 CLINICAL DATA:  Left facial droop, left arm drift, and dysarthria. EXAM: CT ANGIOGRAPHY HEAD AND NECK TECHNIQUE: Multidetector CT imaging of the head and neck was performed using the standard protocol during bolus administration of intravenous contrast. Multiplanar CT image reconstructions and MIPs were obtained to evaluate the vascular anatomy. Carotid stenosis measurements (when applicable) are obtained utilizing NASCET criteria, using the distal internal carotid diameter as the denominator. CONTRAST:  91mL ISOVUE-370 IOPAMIDOL (ISOVUE-370) INJECTION 76% COMPARISON:  None. FINDINGS: CTA NECK FINDINGS Aortic arch: Standard 3 vessel aortic arch. Nonstenotic calcified plaque at the left subclavian artery origin. Right carotid system: Patent with extensive, heavily calcified plaque about the carotid bifurcation resulting in 70% proximal ICA stenosis and approximately 50% distal common carotid artery stenosis. Left carotid system: Patent with scattered plaque in the proximal and mid common carotid artery and more extensive calcified plaque about the carotid bifurcation not  resulting in significant stenosis. Vertebral arteries: Patent and codominant with scattered nonstenotic plaque on the left. Calcified plaque at the right vertebral artery origin results in severe stenosis. Skeleton: Advanced cervical disc and facet degeneration. Other neck: Subcentimeter low-density thyroid nodules bilaterally. Upper chest: Clear lung apices. Review of the MIP images confirms the above findings CTA HEAD FINDINGS Anterior circulation: The internal carotid arteries are patent from skull base to carotid termini with mild siphon atherosclerosis not resulting in significant stenosis. ACAs and MCAs are patent  proximally without significant proximal stenosis, however there is a distal right M2 inferior division branch occlusion. No aneurysm is identified. Posterior circulation: The intracranial vertebral arteries are patent with focal calcified left V4 segment plaque not resulting in a significant stenosis. Patent left PICA, right AICA, and bilateral SCA origins are identified. The basilar artery is widely patent. There is a diminutive left posterior communicating artery. The PCAs are patent without evidence of significant stenosis. No aneurysm is identified. Venous sinuses: Hypoplastic and poorly visualized left transverse and sigmoid sinuses. Other major dural venous sinuses are patent. Anatomic variants: None of significance. Delayed phase: Not performed. Review of the MIP images confirms the above findings IMPRESSION: 1. Distal right M2 branch occlusion. 2. Heavily calcified plaque about both carotid bifurcations with 70% proximal right ICA stenosis. 3. Severe right vertebral artery origin stenosis. These results were called by telephone at the time of interpretation on 12/30/2017 at 5:18 pm to Dr. Rory Percy, who verbally acknowledged these results. Electronically Signed   By: Logan Bores M.D.   On: 12/30/2017 17:39   Ct Head Code Stroke Wo Contrast  Result Date: 12/30/2017 CLINICAL DATA:  Code stroke.  Left facial droop EXAM: CT HEAD WITHOUT CONTRAST TECHNIQUE: Contiguous axial images were obtained from the base of the skull through the vertex without intravenous contrast. COMPARISON:  CT head 08/17/2013 FINDINGS: Brain: Moderate atrophy unchanged. Moderate chronic microvascular ischemic change in the white matter stable. Negative for acute infarct. Negative for acute hemorrhage or mass. No fluid collection or midline shift. Vascular: Negative for hyperdense vessel Skull: Negative Sinuses/Orbits: Mucosal edema paranasal sinuses. Bilateral cataract surgery Other: None ASPECTS ([place] Stroke Program Early CT Score) -  Ganglionic level infarction (caudate, lentiform nuclei, internal capsule, insula, M1-M3 cortex): 7 - Supraganglionic infarction (M4-M6 cortex): 3 Total score (0-10 with 10 being normal): 10 IMPRESSION: 1. No acute abnormality.  No change from the prior study. 2. ASPECTS is 10 3. Moderate atrophy and moderate chronic microvascular ischemia in the white matter. 4. These results were called by telephone at the time of interpretation on 12/30/2017 at 4:49 pm to Dr. Rory Percy , who verbally acknowledged these results. Electronically Signed   By: Franchot Gallo M.D.   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 HEENT-  Normocephalic, no lesions, without obvious abnormality.  Normal external eye and conjunctiva.   Cardiovascular- S1-S2 audible, pulses palpable throughout   Lungs no excessive working breathing.  Saturations within normal limits on RA Extremities- Warm, dry and intact Musculoskeletal-no joint tenderness, deformity or swelling, left side arm weakness. Skin-warm and dry, no hyperpigmentation, vitiligo, or suspicious lesions  Neurological Examination Mental Status: Alert, oriented to person/year/ situation. Speech fluent without evidence of aphasia.  Able to follow commands without difficulty. Cranial  Nerves: II:  Visual fields grossly normal,  III,IV, VI: ptosis not present, extra-ocular motions intact bilaterally, pupils equal, round, reactive to light and accommodation V,VII: smile symmetric, facial light touch sensation normal bilaterally VIII: hearing normal bilaterally IX,X: uvula rises symmetrically XI: bilateral shoulder shrug XII: midline tongue extension Motor: 5/5 strength b/l Tone and bulk:normal tone throughout; no atrophy  noted Sensory:  light touch intact throughout, bilaterally but extinguishes left on double simultaneous stim Deep Tendon Reflexes: 2+ and symmetric throughout Plantars: Right: downgoing                                Left: downgoing Cerebellar: normal finger-to-nose,  normal heel-to-shin test Gait: not tested   ASSESSMENT/PLAN Brittany [name] is a 84 y.o. female with history of hypertension, pacemaker, atrial fibrillation not on anticoagulation, peripheral vascular disease, hyperlipidemia, obstructive sleep apnea on CPAP, PSVT presenting with left facial weakness.  NIH score 0 Stroke:   Strokelike episode right brain felt to be embolic secondary to distal M2 occlusion from known atrial fibrillation not on anticoagulation   Code Stroke CT head No acute abnormality. Small vessel disease. Atrophy. ASPECTS 10.     CTA head & neck distal R M2 branch occlusion. B ICA calcified plaque w R ICA 70% stenosis  Cerebral angio small basilar apex aneurysm, right ICA 60%, right MCA reconstitution  MRI  / MRA  Not done due to pacer  Repeat CT head in am to confirm/refute stroke pending  2D Echo  pending   LDL 123  HgbA1c 5.2  SCDs for VTE prophylaxis  aspirin 81 mg daily prior to admission, now on No antithrombotic.  Patient agreeable to try anticoagulation given new stroke.  Will start on Eliquis and continue aspirin 81 mg daily  Therapy recommendations:  pending   Disposition:  Pending. Anticipate return home  Avoid airline travel ~next 2 weeks  Atrial Fibrillation  Home anticoagulation:  none, pt refused   Home medications, Betapace   CHA2DS2-VASc Score = at least 7, ?2 oral anticoagulation recommended  Age in Years:  ?38   +2    Sex:  Female   Female   +1    Hypertension History:  yes   +1     Diabetes Mellitus:  0  Congestive Heart Failure History:  0  Vascular Disease History:  yes   +1     Stroke/TIA/Thromboembolism History:  yes   +2  Add eliquis. Pt now hesitant but  agreeable.  Will start on Eliquis and continue aspirin 81 mg daily   Hypertension  Elevated 121/108 - 191/81  BP goal over night 140-160. Ok to resume standard post stroke BP goal < 220/110  Home medications: Norvasc 10, lisinopril 20 stable . BP goal normotensive . Resume home blood pressure medications  Hyperlipidemia  Home meds:  No statin  LDL 123, goal < 70  Add lipitor 40 mg daily  Continue statin at discharge  Other Stroke Risk Factors  Advanced age  ETOH use  UDS / ETOH level positive for benzos  Family hx stroke (sister)  Obstructive sleep apnea, on CPAP at home  PVD  PSVT  Other Active Problems  GERD  Hypothyroidism  Sinus node dysfunction s/p pacemaker  Osteoporosis on Fosamax  Hospital day # [place], MSN, APRN, ANVP-BC, AGPCNP-BC Advanced Practice Stroke Nurse [place] for Schedule & Pager information 12/31/2017 1:36 PM  I have  personally examined this patient, reviewed notes, independently viewed imaging studies, participated in medical decision making and plan of care.ROS completed by me personally and pertinent positives fully documented  I have made any additions or clarifications directly to the above note. Agree with note above.  She presented with mild left hemiparesis and neglect secondary to suspect embolic right brain infarct from atrial fibrillation and was not on anticoagulation. She has spontaneously improved. I had long discussion the patient and husband regarding risk benefit of anticoagulation and feel it is greatly warranted and has case and she is agreeable. Check echocardiogram and continue telemetry monitoring. Transfer to neurology floor bed. Repeat CT scan of the head without contrast as we cannot get MRI due to her pacemaker. Start eliquis for anticoagulation. Likely discharge home tomorrow. Therapy consults. Mobilize out of bed. This patient is critically ill and at significant risk of  neurological worsening, death and care requires constant monitoring of vital signs, hemodynamics,respiratory and cardiac monitoring, extensive review of multiple databases, frequent neurological assessment, discussion with family, other specialists and medical decision making of high complexity.I have made any additions or clarifications directly to the above note.This critical care time does not reflect procedure time, or teaching time or supervisory time of PA/NP/Med Resident etc but could involve care discussion time.  I spent 32 minutes of neurocritical care time  in the care of  this patient.      Antony Contras, MD Medical Director [facility] Pager: [phone] 12/31/2017 3:46 PM  To contact Stroke Continuity provider, please refer to [email or web address] After hours, contact General Neurology

## 2017-12-31 NOTE — Progress Notes (Signed)
*  PRELIMINARY RESULTS* Echocardiogram 2D Echocardiogram has been performed.  Leavy Cella 12/31/2017, 1:23 PM

## 2017-12-31 NOTE — Progress Notes (Signed)
ANTICOAGULATION CONSULT NOTE - Initial Consult  Pharmacy Consult for apixiban Indication: atrial fibrillation  Allergies  Allergen Reactions  . Adhesive [Tape] Swelling and Rash  . Benzalkonium Chloride Rash  . Neosporin [Neomycin-Polymyxin-Gramicidin] Swelling and Rash    Patient Measurements: Height: 5\' 8"  (172.7 cm) Weight: 190 lb (86.2 kg) IBW/kg (Calculated) : 63.9  Vital Signs: Temp: 98.7 F (37.1 C) (06/12 0800) Temp Source: Oral (06/12 0800) BP: 134/62 (06/12 0600) Pulse Rate: 63 (06/12 0600)  Labs: Recent Labs    12/30/17 1642 12/30/17 1649  HGB 14.8 15.3*  HCT 44.7 45.0  PLT 300  --   APTT 27  --   LABPROT 13.0  --   INR 0.99  --   CREATININE 0.80 0.70    Estimated Creatinine Clearance: 63.4 mL/min (by C-G formula based on SCr of 0.7 mg/dL).   Medical History: Past Medical History:  Diagnosis Date  . Ankle fracture 2016  . Atrial fibrillation ([place])   . Depression   . Elevated parathyroid hormone   . Fracture of orbital floor with routine healing   . GERD (gastroesophageal reflux disease)   . History of colon polyps    benign  . Hypercalcemia   . Hypertension   . Hyponatremia   . Hypothyroid   . Macular degeneration    wet in the right and dry in the left  . OSA on CPAP   . Osteoporosis   . Peripheral vascular disease ([place])   . PSVT (paroxysmal supraventricular tachycardia) ([place])   . Rotator cuff tear   . Sinus node dysfunction ([facility])    a. s/p MDT pacemaker  . Urinary incontinence   . Wrist fracture 2018    Assessment: 84 y.o. female s/p CVA, starting Apixiban for atrial fibrillation.    Plan:  Start Apixiban at 5 mg po bid  Corinda Gubler, PharmD, [place] 12/31/2017,10:07 AM

## 2017-12-31 NOTE — Progress Notes (Signed)
Referring Physician(s): CODE STROKE  Supervising Physician: Luanne Bras  Patient Status:  [facility] - In-pt  Chief Complaint: None  Subjective:  Left-sided arm weakness/numbness and left facial droop s/p cerebral angiogram 12/30/2017 with Dr. Estanislado Pandy. Patient awake and alert sitting in chair with no complaints at this time. Accompanied by husband at bedside. Denies headache, weakness, dizziness, numbness/tingling, vision changes, hearing changes, tinnitus, or speech difficulty.  Allergies: Adhesive [tape]; Benzalkonium chloride; and Neosporin [neomycin-polymyxin-gramicidin]  Medications: Prior to Admission medications   Medication Sig Start Date End Date Taking? Authorizing Provider  alendronate (FOSAMAX) 70 MG tablet Take 1 tablet (70 mg total) by mouth every 7 (seven) days. Take with a full glass of water on an empty stomach. 03/04/17   Kuneff, Renee A, DO  amLODipine (NORVASC) 10 MG tablet Take 1 tablet (10 mg total) by mouth daily. 08/13/17   Kuneff, Renee A, DO  aspirin EC 81 MG tablet Take 1 tablet (81 mg total) by mouth daily. 04/12/16   Kuneff, Renee A, DO  B Complex-C (B-COMPLEX WITH VITAMIN C) tablet Take 1 tablet by mouth daily.    [provider]  Cholecalciferol (VITAMIN D3) 5000 UNITS CAPS Take 1 Can by mouth daily.    [provider]  citalopram (CELEXA) 20 MG tablet TAKE 1 TABLET (20 MG TOTAL) BY MOUTH DAILY. 08/13/17   Kuneff, Renee A, DO  lisinopril (PRINIVIL,ZESTRIL) 20 MG tablet Take 1 tablet (20 mg total) by mouth daily. 08/22/17   Kuneff, Renee A, DO  Lutein 20 MG TABS Take by mouth.    [provider]  magnesium oxide (MAG-OX) 400 MG tablet Take 1 tablet (400 mg total) by mouth daily. 03/21/14   Deboraha Sprang, MD  Multiple Vitamin (MULTIVITAMIN WITH MINERALS) TABS tablet Take 1 tablet by mouth daily.    [provider]  Multiple Vitamins-Minerals (PRESERVISION AREDS PO) Take 1 tablet by mouth 2 (two) times daily.     [provider]  sotalol (BETAPACE) 80 MG tablet TAKE 1 AND 1/2 TABLETS BY MOUTH TWICE A DAY. Please make yearly appt with Dr. Caryl Comes for May. 1st attempt 08/29/17   Deboraha Sprang, MD     Vital Signs: BP 134/62   Pulse 63   Temp 98.7 F (37.1 C) (Oral)   Resp 14   Ht 5\' 8"  (1.727 m)   Wt 190 lb (86.2 kg)   LMP 07/22/1968   SpO2 97%   BMI 28.89 kg/m   Physical Exam  Constitutional: She appears well-developed and well-nourished. No distress.  Cardiovascular: Normal rate, regular rhythm and normal heart sounds.  No murmur heard. Pulmonary/Chest: Effort normal and breath sounds normal. No respiratory distress. She has no wheezes.  Neurological:  Alert, awake, and oriented x3. Speech and comprehension intact. PERRL bilaterally. EOMs intact and symmetric without nystagmus or subjective diplopia. Visual fields not assessed. No facial asymmetry. Tongue midline. Motor power symmetric proportional to effort. No pronator drift. Fine motor and coordination not assessed. Gait not assessed. Romberg not assessed. Heel to toe not assessed. Distal pulses 2+ bilaterally.  Skin: Skin is warm and dry.  Right groin incision soft without active bleeding or hematoma.  Psychiatric: She has a normal mood and affect. Her behavior is normal. Judgment and thought content normal.  Nursing note and vitals reviewed.   Imaging: Ct Angio Head W Or Wo Contrast  Result Date: 12/30/2017 CLINICAL DATA:  Left facial droop, left arm drift, and dysarthria. EXAM: CT ANGIOGRAPHY HEAD AND NECK TECHNIQUE: Multidetector  CT imaging of the head and neck was performed using the standard protocol during bolus administration of intravenous contrast. Multiplanar CT image reconstructions and MIPs were obtained to evaluate the vascular anatomy. Carotid stenosis measurements (when applicable) are obtained utilizing NASCET criteria, using the distal internal carotid diameter as the denominator. CONTRAST:  55mL  ISOVUE-370 IOPAMIDOL (ISOVUE-370) INJECTION 76% COMPARISON:  None. FINDINGS: CTA NECK FINDINGS Aortic arch: Standard 3 vessel aortic arch. Nonstenotic calcified plaque at the left subclavian artery origin. Right carotid system: Patent with extensive, heavily calcified plaque about the carotid bifurcation resulting in 70% proximal ICA stenosis and approximately 50% distal common carotid artery stenosis. Left carotid system: Patent with scattered plaque in the proximal and mid common carotid artery and more extensive calcified plaque about the carotid bifurcation not resulting in significant stenosis. Vertebral arteries: Patent and codominant with scattered nonstenotic plaque on the left. Calcified plaque at the right vertebral artery origin results in severe stenosis. Skeleton: Advanced cervical disc and facet degeneration. Other neck: Subcentimeter low-density thyroid nodules bilaterally. Upper chest: Clear lung apices. Review of the MIP images confirms the above findings CTA HEAD FINDINGS Anterior circulation: The internal carotid arteries are patent from skull base to carotid termini with mild siphon atherosclerosis not resulting in significant stenosis. ACAs and MCAs are patent proximally without significant proximal stenosis, however there is a distal right M2 inferior division branch occlusion. No aneurysm is identified. Posterior circulation: The intracranial vertebral arteries are patent with focal calcified left V4 segment plaque not resulting in a significant stenosis. Patent left PICA, right AICA, and bilateral SCA origins are identified. The basilar artery is widely patent. There is a diminutive left posterior communicating artery. The PCAs are patent without evidence of significant stenosis. No aneurysm is identified. Venous sinuses: Hypoplastic and poorly visualized left transverse and sigmoid sinuses. Other major dural venous sinuses are patent. Anatomic variants: None of significance. Delayed phase:  Not performed. Review of the MIP images confirms the above findings IMPRESSION: 1. Distal right M2 branch occlusion. 2. Heavily calcified plaque about both carotid bifurcations with 70% proximal right ICA stenosis. 3. Severe right vertebral artery origin stenosis. These results were called by telephone at the time of interpretation on 12/30/2017 at 5:18 pm to Dr. Rory Percy, who verbally acknowledged these results. Electronically Signed   By: Logan Bores M.D.   On: 12/30/2017 17:39   Ct Angio Neck W Or Wo Contrast  Result Date: 12/30/2017 CLINICAL DATA:  Left facial droop, left arm drift, and dysarthria. EXAM: CT ANGIOGRAPHY HEAD AND NECK TECHNIQUE: Multidetector CT imaging of the head and neck was performed using the standard protocol during bolus administration of intravenous contrast. Multiplanar CT image reconstructions and MIPs were obtained to evaluate the vascular anatomy. Carotid stenosis measurements (when applicable) are obtained utilizing NASCET criteria, using the distal internal carotid diameter as the denominator. CONTRAST:  42mL ISOVUE-370 IOPAMIDOL (ISOVUE-370) INJECTION 76% COMPARISON:  None. FINDINGS: CTA NECK FINDINGS Aortic arch: Standard 3 vessel aortic arch. Nonstenotic calcified plaque at the left subclavian artery origin. Right carotid system: Patent with extensive, heavily calcified plaque about the carotid bifurcation resulting in 70% proximal ICA stenosis and approximately 50% distal common carotid artery stenosis. Left carotid system: Patent with scattered plaque in the proximal and mid common carotid artery and more extensive calcified plaque about the carotid bifurcation not resulting in significant stenosis. Vertebral arteries: Patent and codominant with scattered nonstenotic plaque on the left. Calcified plaque at the right vertebral artery origin results in severe stenosis. Skeleton: Advanced  cervical disc and facet degeneration. Other neck: Subcentimeter low-density thyroid nodules  bilaterally. Upper chest: Clear lung apices. Review of the MIP images confirms the above findings CTA HEAD FINDINGS Anterior circulation: The internal carotid arteries are patent from skull base to carotid termini with mild siphon atherosclerosis not resulting in significant stenosis. ACAs and MCAs are patent proximally without significant proximal stenosis, however there is a distal right M2 inferior division branch occlusion. No aneurysm is identified. Posterior circulation: The intracranial vertebral arteries are patent with focal calcified left V4 segment plaque not resulting in a significant stenosis. Patent left PICA, right AICA, and bilateral SCA origins are identified. The basilar artery is widely patent. There is a diminutive left posterior communicating artery. The PCAs are patent without evidence of significant stenosis. No aneurysm is identified. Venous sinuses: Hypoplastic and poorly visualized left transverse and sigmoid sinuses. Other major dural venous sinuses are patent. Anatomic variants: None of significance. Delayed phase: Not performed. Review of the MIP images confirms the above findings IMPRESSION: 1. Distal right M2 branch occlusion. 2. Heavily calcified plaque about both carotid bifurcations with 70% proximal right ICA stenosis. 3. Severe right vertebral artery origin stenosis. These results were called by telephone at the time of interpretation on 12/30/2017 at 5:18 pm to Dr. Rory Percy, who verbally acknowledged these results. Electronically Signed   By: Logan Bores M.D.   On: 12/30/2017 17:39   Ct Head Code Stroke Wo Contrast  Result Date: 12/30/2017 CLINICAL DATA:  Code stroke.  Left facial droop EXAM: CT HEAD WITHOUT CONTRAST TECHNIQUE: Contiguous axial images were obtained from the base of the skull through the vertex without intravenous contrast. COMPARISON:  CT head 08/17/2013 FINDINGS: Brain: Moderate atrophy unchanged. Moderate chronic microvascular ischemic change in the white  matter stable. Negative for acute infarct. Negative for acute hemorrhage or mass. No fluid collection or midline shift. Vascular: Negative for hyperdense vessel Skull: Negative Sinuses/Orbits: Mucosal edema paranasal sinuses. Bilateral cataract surgery Other: None ASPECTS ([place] Stroke Program Early CT Score) - Ganglionic level infarction (caudate, lentiform nuclei, internal capsule, insula, M1-M3 cortex): 7 - Supraganglionic infarction (M4-M6 cortex): 3 Total score (0-10 with 10 being normal): 10 IMPRESSION: 1. No acute abnormality.  No change from the prior study. 2. ASPECTS is 10 3. Moderate atrophy and moderate chronic microvascular ischemia in the white matter. 4. These results were called by telephone at the time of interpretation on 12/30/2017 at 4:49 pm to Dr. Rory Percy , who verbally acknowledged these results. Electronically Signed   By: Franchot Gallo M.D.   On: 12/30/2017 16:49    Labs:  CBC: Recent Labs    08/13/17 1407 12/30/17 1642 12/30/17 1649  WBC 4.8 7.7  --   HGB 15.8* 14.8 15.3*  HCT 47.1* 44.7 45.0  PLT 253.0 300  --     COAGS: Recent Labs    12/30/17 1642  INR 0.99  APTT 27    BMP: Recent Labs    01/10/17 1325  06/03/17 1456 06/06/17 1430 08/13/17 1407 09/29/17 1205 12/30/17 1642 12/30/17 1649  NA 140  --  128* 137  --   --  137 136  K 4.5  --  4.0 4.4  --   --  4.6 4.3  CL 99  --  96* 102  --   --  101 99*  CO2 26  --  24 31  --   --  26  --   GLUCOSE 104*  --  111* 101*  --   --  106* 107*  BUN 9  --  9 9  --   --  18 20  CALCIUM 10.8*   < > 10.2 10.5 10.5* 10.7* 11.4*  --   CREATININE 0.63  --  0.55 0.55  --   --  0.80 0.70  GFRNONAA 85  --  >60  --   --   --  >60  --   GFRAA 98  --  >60  --   --   --  >60  --    < > = values in this interval not displayed.    LIVER FUNCTION TESTS: Recent Labs    12/30/17 1642  BILITOT 0.5  AST 25  ALT 22  ALKPHOS 58  PROT 7.1  ALBUMIN 3.9    Assessment and Plan:  Left-sided arm  weakness/numbness. Left facial droop. Patient's condition improving- no weakness or facial droop noted. Right groin incision stable. Appreciate and agree with neurology management. Plan to follow-up with Dr. Estanislado Pandy for carotid ultrasound and consult in 3 months after discharge.  Electronically Signed: Earley Abide, PA-C 12/31/2017, 9:27 AM   I spent a total of 15 Minutes at the the patient's bedside AND on the patient's hospital floor or unit, greater than 50% of which was counseling/coordinating care for left-sided arm weakness/numbness AND left facial droop.

## 2017-12-31 NOTE — Progress Notes (Signed)
OT screen Note  Patient Details Name: Brittany [name] MRN: [id number] DOB: 1933/10/17   Cancelled Treatment:    Reason Eval/Treat Not Completed: OT screened, no needs identified, will sign off OT spoke with PT and no acute OT needs at thist ime. OT spoke with patient directly and feels she is back to baseline. Pt observed with PT and using the ipad in room with bil UE and stylus/.  Vonita Moss   OTR/L Pager: [phone] Office: [phone] .  12/31/2017, 12:52 PM

## 2018-01-01 ENCOUNTER — Telehealth: Payer: Self-pay | Admitting: Neurology

## 2018-01-01 ENCOUNTER — Encounter (HOSPITAL_COMMUNITY): Payer: Self-pay | Admitting: Interventional Radiology

## 2018-01-01 ENCOUNTER — Inpatient Hospital Stay (HOSPITAL_COMMUNITY): Payer: Medicare Other

## 2018-01-01 MED ORDER — ATORVASTATIN CALCIUM 40 MG PO TABS: 40.0000 mg | ORAL_TABLET | Freq: Every day | ORAL | 3 refills | Status: DC

## 2018-01-01 MED ORDER — APIXABAN 5 MG PO TABS: 5.0000 mg | ORAL_TABLET | Freq: Two times a day (BID) | ORAL | 3 refills | Status: DC

## 2018-01-01 NOTE — Care Management Note (Signed)
Case Management Note  Patient Details  Name: Brittany [name] MRN: [id number] Date of Birth: 1934-02-29  Subjective/Objective:                    Action/Plan: Pt discharging home with self care. CM consulted for outpatient therapy. CM met with the patient and she would like to have therapy through the rehab department at [place]. CM called [facility] and will fax the requested information and orders.  Pt would like a new PCP. CM was able to obtain her an appt with Dr Nancy Fetter who her spouse sees. Information on the AVS. Pt discharging on Eliquis. CM provided her the 30 day free card. Pt does not have medication drug coverage. CM encouraged her to see the cost of the medication after the 30 days through [facility] where she gets her medications filled. CM encouraged her to notify her MD if the cost is too expensive. Pt verbalized understanding.  Spouse to provide transportation home.   Expected Discharge Date:  01/01/18               Expected Discharge Plan:     In-House Referral:     Discharge planning Services  CM Consult  Post Acute Care Choice:    Choice offered to:  Patient  DME Arranged:    DME Agency:     HH Arranged:    [place] Agency:     Status of Service:  Completed, signed off  If discussed at [employer] of Stay Meetings, dates discussed:    Additional Comments:  Pollie Friar, RN 01/01/2018, 12:26 PM

## 2018-01-01 NOTE — Telephone Encounter (Signed)
RN was notified by Katharine Look in phone room that pts husband decline copayment card. Katharine Look in phone room also stated she offer to give the husband the stroke floor nursing station to see if they could help. The husband decline this too.

## 2018-01-01 NOTE — Progress Notes (Signed)
Pt discharged at this time.  In no distress.  Verbalizes understanding of discharge instructions, including follow-up appointments and prescriptions.  Pt has all belongings with her.  Per pt and spouse, he had taken her purse home on admission.

## 2018-01-01 NOTE — Discharge Summary (Addendum)
Stroke Discharge Summary  Patient ID: Brittany [name]   MRN: [id number]      DOB: Apr 23, 1934  Date of Admission: 12/30/2017 Date of Discharge: 01/01/2018  Attending Physician:  Garvin Fila, MD, Stroke MD Consultant(s):    Neuro-Interventionalist Dr. 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 Ankle fracture 2016  . Atrial fibrillation ([place])   . Depression   . Elevated parathyroid hormone   . Fracture of orbital floor with routine healing   . GERD (gastroesophageal reflux disease)   . History of colon polyps    benign  . Hypercalcemia   . Hypertension   . Hyponatremia   . Hypothyroid   . Macular degeneration    wet in the right and dry in the left  . OSA on CPAP   . Osteoporosis   . Peripheral vascular disease ([place])   . PSVT (paroxysmal supraventricular tachycardia) ([place])   . Rotator cuff tear   . Sinus node dysfunction ([facility])    a. s/p MDT pacemaker  . Urinary incontinence   . Wrist fracture 2018   Past Surgical History:  Procedure Laterality Date  . BACK SURGERY    . CATARACT EXTRACTION    . CATARACT EXTRACTION W/ INTRAOCULAR LENS  IMPLANT, BILATERAL Bilateral   . COLONOSCOPY    . ESOPHAGOGASTRODUODENOSCOPY    . IR ANGIO INTRA EXTRACRAN SEL COM CAROTID INNOMINATE BILAT MOD SED  12/30/2017  . IR ANGIO VERTEBRAL SEL VERTEBRAL BILAT MOD SED  12/30/2017  . LAPAROSCOPIC CHOLECYSTECTOMY  08/11/1999  . LUMBAR DISC SURGERY  02/2014  . ORIF ANKLE FRACTURE Left 04/13/2015   Procedure: OPEN REDUCTION INTERNAL FIXATION (ORIF) LEFT ANKLE FRACTURE;  Surgeon: Meredith Pel, MD;  Location: [place];  Service: Orthopedics;  Laterality: Left;  . PACEMAKER PLACEMENT Right 2012   a. MDT dual chamber PPM implanted by Dr Caryl Comes   . SHOULDER ARTHROSCOPY W/ ROTATOR CUFF REPAIR Right 08/30/2014   WITH  MINI-OPEN ROTATOR CUFF REPAIR AND SUBACROMIAL DECOMPRESSION  . SHOULDER ARTHROSCOPY WITH ROTATOR CUFF REPAIR AND SUBACROMIAL DECOMPRESSION Right 08/30/2014   Procedure: SHOULDER ARTHROSCOPY WITH MINI-OPEN ROTATOR CUFF REPAIR AND SUBACROMIAL DECOMPRESSION;  Surgeon: Meredith Pel, MD;  Location: [place];  Service: Orthopedics;  Laterality: Right;  RIGHT SHOULDER DIAGNOSTIC OPERATIVE ARTHROSCOPY, SUBACROMIAL DECOMPRESSION, MINI-OPEN ROTATOR CUFF REPAIR.  Marland Kitchen VAGINAL HYSTERECTOMY  1970    Allergies as of 01/01/2018      Reactions   Adhesive [tape] Swelling, Rash   Benzalkonium Chloride Rash   Neosporin [neomycin-polymyxin-gramicidin] Swelling, Rash      Medication List    TAKE these medications   alendronate 70 MG tablet Commonly known as:  FOSAMAX Take 1 tablet (70 mg total) by mouth every 7 (seven) days. Take with a full glass of water on an empty stomach.   amLODipine 10 MG tablet Commonly known as:  NORVASC Take 1 tablet (10 mg total) by mouth daily.   apixaban 5 MG Tabs tablet Commonly known as:  ELIQUIS Take 1 tablet (5 mg total) by mouth 2 (two) times daily.   aspirin EC 81 MG tablet Take 1 tablet (81 mg total) by mouth daily.   atorvastatin 40 MG tablet Commonly known as:  LIPITOR Take 1 tablet (40 mg total) by mouth daily at 6 PM.   B-complex with vitamin  C tablet Take 1 tablet by mouth daily.   citalopram 20 MG tablet Commonly known as:  CELEXA TAKE 1 TABLET (20 MG TOTAL) BY MOUTH DAILY.   lisinopril 20 MG tablet Commonly known as:  PRINIVIL,ZESTRIL Take 1 tablet (20 mg total) by mouth daily.   Lutein 20 MG Tabs Take 1 tablet by mouth.   magnesium oxide 400 MG tablet Commonly known as:  MAG-OX Take 1 tablet (400 mg total) by mouth daily.   multivitamin with minerals Tabs tablet Take 1 tablet by mouth daily.   PRESERVISION AREDS PO Take 1 tablet by mouth 2 (two) times daily.   sotalol 80 MG tablet Commonly known as:  BETAPACE TAKE 1 AND 1/2 TABLETS BY  MOUTH TWICE A DAY. Please make yearly appt with Dr. Caryl Comes for May. 1st attempt   Vitamin D3 5000 units Caps Take 1 capsule by mouth daily.       LABORATORY STUDIES CBC    Component Value Date/Time   WBC 7.7 12/30/2017 1642   RBC 4.73 12/30/2017 1642   HGB 15.3 (H) 12/30/2017 1649   HGB 14.5 10/28/2012   HCT 45.0 12/30/2017 1649   PLT 300 12/30/2017 1642   PLT 263 10/28/2012   PLT 273 07/24/2012 0807   MCV 94.5 12/30/2017 1642   MCH 31.3 12/30/2017 1642   MCHC 33.1 12/30/2017 1642   RDW 13.2 12/30/2017 1642   RDW 13.5 10/28/2012 1300   LYMPHSABS 1.6 12/30/2017 1642   LYMPHSABS 22 10/28/2012 1300   MONOABS 1.0 12/30/2017 1642   EOSABS 0.2 12/30/2017 1642   EOSABS 0 10/28/2012 1300   BASOSABS 0.0 12/30/2017 1642   BASOSABS 1 10/28/2012 1300   CMP    Component Value Date/Time   NA 136 12/30/2017 1649   NA 140 01/10/2017 1325   K 4.3 12/30/2017 1649   K 3.9 10/28/2012 1246   CL 99 (L) 12/30/2017 1649   CL 100 10/28/2012 1246   CO2 26 12/30/2017 1642   GLUCOSE 107 (H) 12/30/2017 1649   BUN 20 12/30/2017 1649   BUN 9 01/10/2017 1325   BUN 9 10/28/2012 1246   CREATININE 0.70 12/30/2017 1649   CREATININE 0.59 (L) 08/14/2016 0930   CALCIUM 11.4 (H) 12/30/2017 1642   CALCIUM 10.4 10/28/2012 1246   PROT 7.1 12/30/2017 1642   PROT 6.9 10/28/2012 1246   ALBUMIN 3.9 12/30/2017 1642   ALBUMIN 4.2 10/28/2012 1246   AST 25 12/30/2017 1642   AST 20 10/28/2012 1246   ALT 22 12/30/2017 1642   ALKPHOS 58 12/30/2017 1642   ALKPHOS 79 10/28/2012 1246   BILITOT 0.5 12/30/2017 1642   BILITOT 1.1 10/28/2012 1246   GFRNONAA >60 12/30/2017 1642   GFRNONAA 87 08/14/2016 0930   GFRAA >60 12/30/2017 1642   GFRAA >89 08/14/2016 0930   COAGS Lab Results  Component Value Date   INR 0.99 12/30/2017   INR 1.15 04/15/2015   INR 1.20 04/14/2015   Lipid Panel    Component Value Date/Time   CHOL 218 (H) 12/31/2017 0329   TRIG 70 12/31/2017 0329   TRIG 69 10/28/2012 1246   HDL  81 12/31/2017 0329   CHOLHDL 2.7 12/31/2017 0329   VLDL 14 12/31/2017 0329   LDLCALC 123 (H) 12/31/2017 0329   HgbA1C  Lab Results  Component Value Date   HGBA1C 5.2 12/31/2017   Urinalysis    Component Value Date/Time   COLORURINE STRAW (A) 12/30/2017 1633   APPEARANCEUR CLEAR 12/30/2017 1633   LABSPEC 1.031 (  H) 12/30/2017 1633   PHURINE 7.0 12/30/2017 1633   GLUCOSEU NEGATIVE 12/30/2017 1633   HGBUR MODERATE (A) 12/30/2017 1633   BILIRUBINUR NEGATIVE 12/30/2017 1633   KETONESUR NEGATIVE 12/30/2017 1633   PROTEINUR NEGATIVE 12/30/2017 1633   UROBILINOGEN TEST NOT PERFORMED 09/20/2013 1043   NITRITE NEGATIVE 12/30/2017 1633   LEUKOCYTESUR NEGATIVE 12/30/2017 1633   Urine Drug Screen     Component Value Date/Time   LABOPIA NONE DETECTED 12/30/2017 1633   COCAINSCRNUR NONE DETECTED 12/30/2017 1633   LABBENZ POSITIVE (A) 12/30/2017 1633   AMPHETMU NONE DETECTED 12/30/2017 1633   THCU NONE DETECTED 12/30/2017 1633   LABBARB NONE DETECTED 12/30/2017 1633    Alcohol Level    Component Value Date/Time   ETH <10 12/30/2017 1642     SIGNIFICANT DIAGNOSTIC STUDIES CTA Head/Neck 12/30/2017 IMPRESSION: 1. Distal right M2 branch occlusion. 2. Heavily calcified plaque about both carotid bifurcations with 70% proximal right ICA stenosis. 3. Severe right vertebral artery origin stenosis.    HISTORY OF PRESENT ILLNESS Brittany [name] an 84 y.o.femalewith PMH significant for HTN, afib (controlled with pacemaker), PVD presents to [facility] as a code stroke.  Patient was normal when she woke up this morning. Her and her husband went to the airport to catch a flight out of town and at [employer] she had a sudden onset of left arm weakness and numbness, with slurred speech and Left facial droop. Per EMS these symptoms lasted about 40 minutes. They had resolved by the time she arrived at [facility] Denies SOB, CP, vision problems , or HA. Per patient she has never been on any anticoagulation  therapy because she refused. BG per EMS was 131 and BP 170/90 on arrival at [facility].  HOSPITAL COURSE [name] is a 84 y.o. female with history of hypertension, pacemaker, atrial fibrillation not on anticoagulation, peripheral vascular disease, hyperlipidemia, obstructive sleep apnea on CPAP, PSVT presenting with left facial weakness.  NIH score 0 Stroke:   Strokelike episode right brain felt to be embolic secondary to distal M2 occlusion from known atrial fibrillation not on anticoagulation   Code Stroke CT head No acute abnormality. Small vessel disease. Atrophy. ASPECTS 10.     CTA head & neck distal R M2 branch occlusion. B ICA calcified plaque w R ICA 70% stenosis  Cerebral angio small basilar apex aneurysm, right ICA 60%, right MCA reconstitution  MRI  / MRA  Not done due to pacer  Repeat CT head on 6/13 - No acute abnormality. Atrophy and severe chronic small vessel disease.  2D Echo : LV EF: 55% -   60%, No mention on left apical thrombus  LDL 123  HgbA1c 5.2  SCDs for VTE prophylaxis  aspirin 81 mg daily prior to admission, now on No antithrombotic.  Patient agreeable to try anticoagulation given new stroke.  Continue Eliquis and aspirin 81 mg daily  Therapy recommendations:  out patient Physical therapy at [facility]  Disposition:  Home  Avoid airline travel ~next 6 weeks  Atrial Fibrillation  Home anticoagulation:  none, pt refused   Home medications, Betapace   CHA2DS2-VASc Score = at least 7, ?2 oral anticoagulation recommended             Age in Years:  ?75   +2                        Sex:  Female   Female   +1  Hypertension History:  yes   +1                        Diabetes Mellitus:  0             Congestive Heart Failure History:  0             Vascular Disease History:  yes   +1                            Stroke/TIA/Thromboembolism History:  yes   +2  Patient started on Eliquis 5mg  BID   Hypertension  Elevated intially, but  now SBP 130s  Home medications: Norvasc 10, lisinopril 20 stable  BP goal normotensive  Resume home blood pressure medications  Hyperlipidemia  Home meds:  No statin  LDL 123, goal < 70  Cotninut lipitor 40 mg daily at discharge  Other Stroke Risk Factors  Advanced age  ETOH use  UDS / ETOH level positive for benzos  Family hx stroke (sister)  Obstructive sleep apnea, on CPAP at home  PVD  PSVT  Other Active Problems  GERD  Hypothyroidism  Sinus node dysfunction s/p pacemaker  Osteoporosis on Fosamax  DISCHARGE EXAM Blood pressure 134/63, pulse 64, temperature 97.7 F (36.5 C), temperature source Oral, resp. rate 18, height 5\' 8"  (1.727 m), weight 86.7 kg (191 lb 2.2 oz), last menstrual period 07/22/1968, SpO2 100 %. PHYSICAL EXAM   Pleasant elderly lady in no acute distress HEENT- Normocephalic, no lesions, without obvious abnormality. Normal external eye and conjunctiva.  Cardiovascular- S1-S2 audible, pulses palpable throughout  Lungs no excessive working breathing.  Extremities- Warm, dry and intact Musculoskeletal-no joint tenderness, deformity or swelling, left side arm weakness. Skin-warm and dry  Neurological Examination Mental Status: Alert, orientedto person/year/ situation.Speech fluent without evidence of aphasia. Able to follow commands without difficulty. Cranial Nerves: II: Visual fields grossly normal,  III,IV, VI: ptosis not present, extra-ocular motions intact bilaterally, pupils equal, round, reactive to light and accommodation V,VII: smile symmetric, facial light touch sensation normal bilaterally VIII: hearing normal bilaterally IX,X: uvula rises symmetrically XI: bilateral shoulder shrug XII: midline tongue extension Motor: Diminished fine motor skills in the left hand, left arm weakness with left leg weakness and drift. 4/5 strengthin left upper and lower extremities Tone and bulk:normal tone throughout; no atrophy  noted Sensory: light touch intact throughout, bilaterally but extinguishes left on double simultaneous stim Cerebellar: normal finger-to-nose,  Gait:not tested    Discharge Diet    Diet Order           Diet regular Room service appropriate? Yes; Fluid consistency: Thin  Diet effective now         liquids  DISCHARGE PLAN  Disposition:  Home  aspirin 81 mg daily  And Eliquis 5mg  BID for secondary stroke prevention.  Ongoing risk factor control by Primary Care Physician at time of discharge  Follow-up Kuneff, Renee A, DO in 2 weeks.  Follow-up in [facility] in 6 weeks, office to schedule an appointment.   45 minutes were spent preparing discharge.   Carney Bern 01/01/2018 11:49 AM  I have personally examined this patient, reviewed notes, independently viewed imaging studies, participated in medical decision making and plan of care.ROS completed by me personally and pertinent positives fully documented  I have made any additions or clarifications directly to the above note. Agree with note above.  Antony Contras, MD Medical Director [facility] Pager: [phone] 01/01/2018 3:11 PM

## 2018-01-01 NOTE — Telephone Encounter (Signed)
Pt called she was given a a free 30 day trial offer when leaving the hospital, she took it to [facility] but was told it had been used at [place] already. Pt is wanting to get another card. Pt said she cannot take the medication unless she tries it 1st. Rn Katrina offered $10 copay card but the pt's husband declined it. Pt's husband said they were going home and would call the hospital from home  [facility]

## 2018-01-01 NOTE — Discharge Instructions (Signed)

## 2018-01-02 ENCOUNTER — Telehealth: Payer: Self-pay | Admitting: Family Medicine

## 2018-01-02 NOTE — Telephone Encounter (Signed)
I spoke to Buyer, retail at [facility] and she has taken care of this and sent another coupon to [facility].

## 2018-01-02 NOTE — Telephone Encounter (Signed)
Copied from [place] [phone]. Topic: General - Other >> Jan 02, 2018  1:12 PM Margot Ables wrote: Reason for CRM: requesting written orders w/ MD signature for PT eval and treat - pt was in the hospital 6/11-6/13 for stroke. Fax # [phone]. Please advise.

## 2018-01-05 ENCOUNTER — Telehealth: Payer: Self-pay | Admitting: Internal Medicine

## 2018-01-05 ENCOUNTER — Telehealth: Payer: Self-pay

## 2018-01-05 DIAGNOSIS — L03114 Cellulitis of left upper limb: Secondary | ICD-10-CM | POA: Diagnosis not present

## 2018-01-05 NOTE — Telephone Encounter (Signed)
LM requesting call back to complete TCM and schedule hospital f/u.  

## 2018-01-05 NOTE — Telephone Encounter (Signed)
RN call Brittany [name] case manager at the hospital. Rn stated pt is calling about 90 day supply for eliquis. Vida Roller stated pt was given 0$ copayment card on Friday that's only good for 30 days. Vida Roller also stated pt does not have medication pharmacy coverage on her insurance plan.Rn verbalized understanding. It was confirmed pt was given  eliquis copayment card for a one time 0$ co payment.

## 2018-01-05 NOTE — Telephone Encounter (Signed)
Spoke with the pt and she is calling to let Dr Caryl Comes and RN know that she was recently hospitalized for stroke, and was discharged on Eliquis 5 mg po bid.  Pt reports stroke was secondary to possible atrial fibrillation.  Pt states she is followed for a-fib by Dr Caryl Comes, and she declined anticoagulation as recommended by him in the past. Pt states that she is now on Eliquis and very much intends to take this medication from here on out, with no skipped doses. Pt would like this information passed along to Dr Caryl Comes.  Informed the pt that Dr Caryl Comes and RN are out of the office today, but I will route this message to them for further review and follow-up as needed with the pt.  Pt verbalized understanding and agrees with this plan.

## 2018-01-05 NOTE — Telephone Encounter (Signed)
Unable to authorize any home health or PT until patient is seen for hospital follow up.

## 2018-01-05 NOTE — Telephone Encounter (Signed)
New Message:       Pt c/o medication issue:  1. Name of Medication: apixaban (ELIQUIS) 5 MG TABS tablet  2. How are you currently taking this medication (dosage and times per day)? Take 1 tablet (5 mg total) by mouth 2 (two) times daily.  3. Are you having a reaction (difficulty breathing--STAT)? No  4. What is your medication issue? Pt just wanted to make the Dr aware that she is now on this medication due to having a stroke. Pt wants to make sure that this medication is okay to take

## 2018-01-05 NOTE — Telephone Encounter (Signed)
Rn spoke with the pharmacy at [facility] about pt wanting 90 day supply. Rn stated the pt was discharge last week from the hospital. RN ask if pt gave them the 0$ copayment card only good for 30 days. The pharmacy stated patient pick up the eliquis on 01/02/2018, and paid 0$ dollars.She stated pt has 3 refills left on the eliquis. Rn verbalized understanding.

## 2018-01-06 NOTE — Telephone Encounter (Signed)
LM requesting call back to complete TCM and schedule hosp f/u.  

## 2018-01-07 NOTE — Telephone Encounter (Signed)
LM requesting call back.  

## 2018-01-08 NOTE — Telephone Encounter (Signed)
noted 

## 2018-01-09 NOTE — Telephone Encounter (Signed)
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    How have you been since you were released from the hospital? "fine"   Do you understand why you were in the hospital? yes   Do you understand the discharge instructions? yes   Where were you discharged to? Home.    Items Reviewed:  Medications reviewed: no, pt refused review.   Allergies reviewed: no, pt refused reveiw  Dietary changes reviewed: yes  Referrals reviewed: yes   Functional Questionnaire:   Activities of Daily Living (ADLs):   She states they are independent in the following: When asked if she was having any issues with caring for herself, pt responds "no" States they require assistance with the following: None   Any transportation issues/concerns?: no   Any patient concerns? no   Confirmed importance and date/time of follow-up visits scheduled yes  Pt declined to make f/u appt.   Confirmed with patient if condition begins to worsen call PCP or go to the ER.  Patient was given the office number and encouraged to call back with question or concerns.  : yes

## 2018-01-15 ENCOUNTER — Other Ambulatory Visit: Payer: Self-pay | Admitting: *Deleted

## 2018-01-15 NOTE — Patient Outreach (Signed)
[facility]) Care Management  01/15/2018  Brittany [name] 11-25-1933 [id number]  Referral via Red Alert-EMMI-Stroke; Day# 13, 01/14/2018: Reason: Martin Majestic to follow up appointment-'no"  Chart Review: Admission; 6/11-6/13/2019 Dx Stroke-ischemic Hx:HTN, Afib. Pacemaker, PVD, OSA, Hyperlipidemia  Telephone call to patient who was advised of reason for call; Patient voices that she changed primary care providers and has made appointment for follow up on 01/20/2018. States spouse will provide transportation to appointment. Voices that she has all of medication. States she does not have prescription coverage and pays for all of her medications. Patient was given toll free number for [facility] to inquire about Part D for prescription coverage information.    Stroke symptoms reviewed with patient who voices understanding of importance of calling 911 if occurrence of symptoms.  No other health concerns. EMMI -red alert addressed.  Plan: Close case.  Sherrin Daisy, RN BSN [place] Management Coordinator [facility] Care Management  [phone]

## 2018-01-19 ENCOUNTER — Telehealth: Payer: Self-pay | Admitting: Family Medicine

## 2018-01-19 DIAGNOSIS — F411 Generalized anxiety disorder: Secondary | ICD-10-CM | POA: Diagnosis not present

## 2018-01-19 DIAGNOSIS — E785 Hyperlipidemia, unspecified: Secondary | ICD-10-CM | POA: Diagnosis not present

## 2018-01-19 DIAGNOSIS — Z8673 Personal history of transient ischemic attack (TIA), and cerebral infarction without residual deficits: Secondary | ICD-10-CM | POA: Diagnosis not present

## 2018-01-19 DIAGNOSIS — I4891 Unspecified atrial fibrillation: Secondary | ICD-10-CM | POA: Diagnosis not present

## 2018-01-19 DIAGNOSIS — N3281 Overactive bladder: Secondary | ICD-10-CM | POA: Diagnosis not present

## 2018-01-19 DIAGNOSIS — I10 Essential (primary) hypertension: Secondary | ICD-10-CM | POA: Diagnosis not present

## 2018-01-19 NOTE — Telephone Encounter (Signed)
Medical records request form received via fax from [facility], stating patient has requested to transfer to their office and they need all records sent to them.    PCP updated, ROI request sent to HIM for processing.

## 2018-01-20 ENCOUNTER — Other Ambulatory Visit (HOSPITAL_COMMUNITY): Payer: Self-pay | Admitting: Interventional Radiology

## 2018-01-20 DIAGNOSIS — I771 Stricture of artery: Secondary | ICD-10-CM

## 2018-01-26 ENCOUNTER — Ambulatory Visit (HOSPITAL_COMMUNITY)
Admission: RE | Admit: 2018-01-26 | Discharge: 2018-01-26 | Disposition: A | Discharge status: Home / Self Care | Payer: Medicare Other | Source: Ambulatory Visit | Attending: Interventional Radiology | Admitting: Interventional Radiology

## 2018-01-26 DIAGNOSIS — I6521 Occlusion and stenosis of right carotid artery: Secondary | ICD-10-CM | POA: Insufficient documentation

## 2018-01-26 DIAGNOSIS — I771 Stricture of artery: Secondary | ICD-10-CM

## 2018-01-26 NOTE — Progress Notes (Signed)
*  PRELIMINARY RESULTS* Vascular Ultrasound Carotid Duplex (Doppler) has been completed.  Preliminary findings:  Right Carotid: Velocities in the right ICA are consistent with a 40-59% stenosis. Significant mixed plaque throughout, difficult to evaluate bifrucation.  Left Carotid: Velocities in the left ICA are consistent with a 1-39% stenosis. Significant mixed plaque throughout, difficult to evaluate bifrucation.  Vertebrals: Bilateral vertebral arteries demonstrate antegrade flow.    Everrett Coombe 01/26/2018, 4:57 PM

## 2018-01-29 NOTE — Telephone Encounter (Signed)
Signing past encounter of attempted phone call 

## 2018-02-03 ENCOUNTER — Telehealth (HOSPITAL_COMMUNITY): Payer: Self-pay

## 2018-02-03 NOTE — Telephone Encounter (Signed)
Called pt regarding recent US carotid, no answer, left message for pt to return call. AW

## 2018-02-05 ENCOUNTER — Ambulatory Visit: Payer: BLUE CROSS/BLUE SHIELD | Admitting: Adult Health

## 2018-02-05 ENCOUNTER — Telehealth (HOSPITAL_COMMUNITY): Payer: Self-pay

## 2018-02-05 NOTE — Telephone Encounter (Signed)
Pt agreed to f/u in 6 months with us carotid. AW 

## 2018-02-06 ENCOUNTER — Encounter: Payer: Self-pay | Admitting: Adult Health

## 2018-02-06 ENCOUNTER — Telehealth: Payer: Self-pay | Admitting: Internal Medicine

## 2018-02-06 ENCOUNTER — Ambulatory Visit (INDEPENDENT_AMBULATORY_CARE_PROVIDER_SITE_OTHER): Payer: Medicare Other | Admitting: Adult Health

## 2018-02-06 VITALS — BP 148/71 | HR 64 | Wt 193.0 lb

## 2018-02-06 DIAGNOSIS — E785 Hyperlipidemia, unspecified: Secondary | ICD-10-CM | POA: Diagnosis not present

## 2018-02-06 DIAGNOSIS — Z9989 Dependence on other enabling machines and devices: Secondary | ICD-10-CM

## 2018-02-06 DIAGNOSIS — I480 Paroxysmal atrial fibrillation: Secondary | ICD-10-CM | POA: Diagnosis not present

## 2018-02-06 DIAGNOSIS — I10 Essential (primary) hypertension: Secondary | ICD-10-CM

## 2018-02-06 DIAGNOSIS — G4733 Obstructive sleep apnea (adult) (pediatric): Secondary | ICD-10-CM | POA: Diagnosis not present

## 2018-02-06 DIAGNOSIS — I6300 Cerebral infarction due to thrombosis of unspecified precerebral artery: Secondary | ICD-10-CM | POA: Diagnosis not present

## 2018-02-06 NOTE — Progress Notes (Signed)
[facility] [address] [place]. [place] [address] [phone]       OFFICE FOLLOW UP NOTE  Brittany [name] Date of Birth:  Nov 25, 1933 Medical Record Number:  [id number]   Reason for Referral:  hospital stroke follow up  CHIEF COMPLAINT:  Chief Complaint  Patient presents with  . Follow-up    Hospital follow up for stroke , pt seen by Dr. Leonie Man pr in room 9 is alone pt     HPI: Brittany [name] is being seen today for initial visit in the office for possible right brain stroke (unable to do MRI d/t pacer) on 12/30/17. History obtained from patient and chart review. Reviewed all radiology images and labs personally.  BrittanyBrittany [name] a 84 y.o.femalewith history of hypertension, pacemaker, atrial fibrillation not on anticoagulation, peripheral vascular disease, hyperlipidemia, obstructive sleep apnea on CPAP, PSVTwho presented with left facial weakness, left arm weakness/numbness and slurred speech.  CT head reviewed and was negative for acute abnormality.  CTA head and neck did show distal right M2 branch occlusion and bilateral ICA calcified plaque with right ICA 70% stenosis.  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  MRI unable to be obtained due to pacer.  Repeat CT scan on 613 showed no acute abnormality.  2D echo showed an EF of 55 to 60%.  LDL 123 and his patient was not on statin PTA is recommended to start Lipitor 40 mg.  A1c satisfactory at 5.2.  Patient was on aspirin 81 mg PTA and recommended to start Eliquis and aspirin 81 mg daily due to atrial fibrillation and stroke.  Therapy recommended outpatient PT.  Patient is being seen today for hospital follow-up and overall is doing well from a stroke standpoint.  Denies residual deficits or side effects from the stroke.  She has returned back to doing all previous activities.  She continues to take Eliquis and aspirin 81 mg with moderate  amount of bruising but denies bleeding.  Continues to take Lipitor without side effects of myalgias.  Blood pressure mildly elevated at 148/71 with patient states she does monitor this at home and is typically lower.  Patient was tearful throughout appointment while speaking about Eliquis prescription.  Patient states she is unsure of why she is crying and this has been increased since his stroke.  Patient currently on Celexa 20 mg daily for which she has been on for several years for depression and states this is working well prior.  Recommended additional antidepressant as Celexa not recommended to increase over 82 years of age.  Patient declines additional antidepressant at this time.  Patient has not followed up with cardiologist at this time but states she will call to make an appointment.  Did undergo carotid ultrasound which was stable and recommended repeat in 6 months by vascular surgery.  Does have a history of OSA and compliant with CPAP and is followed by her pulmonologist.  Denies new or worsening stroke/TIA symptoms at this time.   ROS:   14 system review of systems performed and negative with exception of spinning sensation, easy bruising, dizziness  PMH:  Past Medical History:  Diagnosis Date  . Ankle fracture 2016  . Atrial fibrillation ([place])   . Depression   . Elevated parathyroid hormone   . Fracture of orbital floor with routine healing   . GERD (gastroesophageal reflux disease)   . History of colon polyps    benign  .  Hypercalcemia   . Hypertension   . Hyponatremia   . Hypothyroid   . Macular degeneration    wet in the right and dry in the left  . OSA on CPAP   . Osteoporosis   . Peripheral vascular disease ([place])   . PSVT (paroxysmal supraventricular tachycardia) ([place])   . Rotator cuff tear   . Sinus node dysfunction ([facility])    a. s/p MDT pacemaker  . Stroke ([place])   . Urinary incontinence   . Wrist fracture 2018    PSH:  Past Surgical History:  Procedure  Laterality Date  . BACK SURGERY    . CATARACT EXTRACTION    . CATARACT EXTRACTION W/ INTRAOCULAR LENS  IMPLANT, BILATERAL Bilateral   . COLONOSCOPY    . ESOPHAGOGASTRODUODENOSCOPY    . IR ANGIO INTRA EXTRACRAN SEL COM CAROTID INNOMINATE BILAT MOD SED  12/30/2017  . IR ANGIO VERTEBRAL SEL VERTEBRAL BILAT MOD SED  12/30/2017  . LAPAROSCOPIC CHOLECYSTECTOMY  08/11/1999  . LUMBAR DISC SURGERY  02/2014  . ORIF ANKLE FRACTURE Left 04/13/2015   Procedure: OPEN REDUCTION INTERNAL FIXATION (ORIF) LEFT ANKLE FRACTURE;  Surgeon: Meredith Pel, MD;  Location: [place];  Service: Orthopedics;  Laterality: Left;  . PACEMAKER PLACEMENT Right 2012   a. MDT dual chamber PPM implanted by Dr Caryl Comes   . SHOULDER ARTHROSCOPY W/ ROTATOR CUFF REPAIR Right 08/30/2014   WITH MINI-OPEN ROTATOR CUFF REPAIR AND SUBACROMIAL DECOMPRESSION  . SHOULDER ARTHROSCOPY WITH ROTATOR CUFF REPAIR AND SUBACROMIAL DECOMPRESSION Right 08/30/2014   Procedure: SHOULDER ARTHROSCOPY WITH MINI-OPEN ROTATOR CUFF REPAIR AND SUBACROMIAL DECOMPRESSION;  Surgeon: Meredith Pel, MD;  Location: [facility];  Service: Orthopedics;  Laterality: Right;  RIGHT SHOULDER DIAGNOSTIC OPERATIVE ARTHROSCOPY, SUBACROMIAL DECOMPRESSION, MINI-OPEN ROTATOR CUFF REPAIR.  Marland Kitchen VAGINAL HYSTERECTOMY  1970    Social History:  Social History   Socioeconomic History  . Marital status: Married    Spouse name: Not on file  . Number of children: 4  . Years of education: 63  . Highest education level: Not on file  Occupational History  . 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 Financial resource strain: Not on file  . Food insecurity:    Worry: Not on file    Inability: Not on file  . Transportation needs:    Medical: Not on file    Non-medical: Not on file  Tobacco Use  . Smoking status: Former Smoker    Packs/day: 1.00    Years: 45.00    Pack years: 45.00    Types: Cigarettes  . Smokeless tobacco: Never Used  . Tobacco comment:  quit smoking in 1992  Substance and Sexual Activity  . Alcohol use: Yes    Alcohol/week: 16.8 oz    Types: 28 Glasses of wine per week    Comment: 08/30/2014 "16oz wine q night"  . Drug use: No  . Sexual activity: Yes    Birth control/protection: Surgical  Lifestyle  . Physical activity:    Days per week: Not on file    Minutes per session: Not on file  . Stress: Not on file  Relationships  . Social connections:    Talks on phone: Not on file    Gets together: Not on file    Attends religious service: Not on file    Active member of club or organization: Not on file    Attends meetings of clubs or organizations: Not on file    Relationship  status: Not on file  . Intimate partner violence:    Fear of current or ex partner: Not on file    Emotionally abused: Not on file    Physically abused: Not on file    Forced sexual activity: Not on file  Other Topics Concern  . Not on file  Social History Narrative   Ms. Mcray lives with her husband at [employer]. They are retired and recently relocated to [facility] from [place]. She has 4 grown children.    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    Family History:  Family History  Problem Relation Age of Onset  . Colon cancer Mother        colon  . Cancer Father        unknown type  . Stroke Sister   . Skin cancer Brother   . Heart disease Brother   . Breast cancer Paternal Aunt   . Asthma Brother     Medications:   Current Outpatient Medications on File Prior to Visit  Medication Sig Dispense Refill  . alendronate (FOSAMAX) 70 MG tablet Take 1 tablet (70 mg total) by mouth every 7 (seven) days. Take with a full glass of water on an empty stomach. 4 tablet 11  . amLODipine (NORVASC) 10 MG tablet Take 1 tablet (10 mg total) by mouth daily. 90 tablet 1  . apixaban (ELIQUIS) 5 MG TABS tablet Take 1 tablet (5 mg total) by mouth 2 (two) times daily. 60 tablet 3  . aspirin EC 81 MG tablet Take  1 tablet (81 mg total) by mouth daily.    Marland Kitchen atorvastatin (LIPITOR) 40 MG tablet Take 1 tablet (40 mg total) by mouth daily at 6 PM. 30 tablet 3  . B Complex-C (B-COMPLEX WITH VITAMIN C) tablet Take 1 tablet by mouth daily.    . Cholecalciferol (VITAMIN D3) 5000 UNITS CAPS Take 1 capsule by mouth daily.     . citalopram (CELEXA) 20 MG tablet TAKE 1 TABLET (20 MG TOTAL) BY MOUTH DAILY. 90 tablet 1  . lisinopril (PRINIVIL,ZESTRIL) 20 MG tablet Take 1 tablet (20 mg total) by mouth daily. 90 tablet 1  . Lutein 20 MG TABS Take 1 tablet by mouth.     . magnesium oxide (MAG-OX) 400 MG tablet Take 1 tablet (400 mg total) by mouth daily. 30 tablet 0  . Multiple Vitamin (MULTIVITAMIN WITH MINERALS) TABS tablet Take 1 tablet by mouth daily.    . Multiple Vitamins-Minerals (PRESERVISION AREDS PO) Take 1 tablet by mouth 2 (two) times daily.    . sotalol (BETAPACE) 80 MG tablet TAKE 1 AND 1/2 TABLETS BY MOUTH TWICE A DAY. Please make yearly appt with Dr. Caryl Comes for May. 1st attempt 270 tablet 0   No current facility-administered medications on file prior to visit.     Allergies:   Allergies  Allergen Reactions  . Adhesive [Tape] Swelling and Rash  . Benzalkonium Chloride Rash  . Neosporin [Neomycin-Polymyxin-Gramicidin] Swelling and Rash     Physical Exam  Vitals:   02/06/18 1035  BP: (!) 148/71  Pulse: 64  Weight: 193 lb (87.5 kg)   Body mass index is 29.35 kg/m. No exam data present  General: well developed, well nourished, pleasant elderly Caucasian female, seated, in no evident distress Head: head normocephalic and atraumatic.   Neck: supple with no carotid or supraclavicular bruits Cardiovascular: regular rate and rhythm, no murmurs Musculoskeletal: no deformity Skin:  no rash/petichiae Vascular:  Normal pulses all extremities  Neurologic Exam Mental Status: Awake and fully alert. Oriented to place and time. Recent and remote memory intact. Attention span, concentration and fund of  knowledge appropriate. Mood and affect appropriate.  MMSE - Mini Mental State Exam 05/24/2016  Orientation to time 5  Orientation to Place 5  Registration 3  Attention/ Calculation 5  Recall 3  Language- name 2 objects 2  Language- repeat 1  Language- follow 3 step command 3  Language- read & follow direction 1  Write a sentence 1  Copy design 1  Total score 30   Cranial Nerves: Fundoscopic exam reveals sharp disc margins. Pupils equal, briskly reactive to light. Extraocular movements full without nystagmus. Visual fields full to confrontation. Hearing intact. Facial sensation intact. Face, tongue, palate moves normally and symmetrically.  Motor: Normal bulk and tone. Normal strength in all tested extremity muscles. Sensory.: intact to touch , pinprick , position and vibratory sensation.  Coordination: Rapid alternating movements normal in all extremities. Finger-to-nose and heel-to-shin performed accurately bilaterally. Gait and Station: Arises from chair without difficulty. Stance is normal. Gait demonstrates normal stride length and balance . Able to heel, toe and tandem walk without difficulty.  Reflexes: 1+ and symmetric. Toes downgoing.    NIHSS  0 Modified Rankin  1 HAS-BLED 3 CHA2DS2-VASc 7   Diagnostic Data (Labs, Imaging, Testing)  CT HEAD WO CONTRAST 12/30/17 IMPRESSION: 1. No acute abnormality.  No change from the prior study. 2. ASPECTS is 10 3. Moderate atrophy and moderate chronic microvascular ischemia in the white matter.  CT ANGIO HEAD W OR WO CONTRAST CT ANGIO NECK W OR WO CONTRAST 12/30/17 IMPRESSION: 1. Distal right M2 branch occlusion. 2. Heavily calcified plaque about both carotid bifurcations with 70% proximal right ICA stenosis. 3. Severe right vertebral artery origin stenosis.  CEREBRAL ANGIOGRAM 01/01/18 IMPRESSION: Approximately 60% stenosis of the right internal carotid artery at the bulb by the NASCET criteria. Less than 30% stenosis of  the left internal carotid artery at the bulb. Approximately 50% stenosis of the right external carotid artery origin. Approximately 4 mm x 4.1 mm basilar apex aneurysm. PLAN: Findings were reviewed with the patient. The following day the patient remained clinically fully intact. The right groin appeared soft. Distal pulses were palpable in the dorsalis pedis, and posterior tibial regions. Following discharge, the patient will be followed in the Neuro Interventional clinic with ultrasound of the carotids in 3 weeks Time.  ECHOCARDIOGRAM 12/31/17 Study Conclusions - Left ventricle: The cavity size was normal. Wall thickness was   normal. Systolic function was normal. The estimated ejection   fraction was in the range of 55% to 60%. Wall motion was normal;   there were no regional wall motion abnormalities. There was a   reduced contribution of atrial contraction to ventricular   filling, due to increased ventricular diastolic pressure or   atrial contractile dysfunction (consider atrial mechanical   stunning after recentt atrial fibrillation). - Aortic valve: There was mild regurgitation. - Left atrium: The atrium was mildly dilated.  VAS UR CAROTID DUPLEX BILATERAL 01/26/18 Final Interpretation: Right Carotid: Velocities in the right ICA are consistent with a 40-59%        stenosis. Significant mixed plaque throughout, difficult to        evaluate bifrucation. Left Carotid: Velocities in the left ICA are consistent with a 1-39% stenosis.       Significant mixed plaque throughout, difficult to evaluate       bifrucation. Vertebrals:  Bilateral vertebral arteries demonstrate antegrade flow.     ASSESSMENT: ALIZIA GREIF is a 84 y.o. year old female here with possible right brain infarct on 12/30/17 secondary to AF not on [facility]. Vascular risk factors include AF, HTN, and HLD.    PLAN: -Continue aspirin 81 mg daily and Eliquis (apixaban) daily  and  lipitor  for secondary stroke prevention -F/u with PCP regarding your HLD and HTN management -f/u with cardiologist regarding AF and eliquis management -continue to monitor BP at home -advised to follow up with PCP regarding increased depression as currently declining additional medication -Maintain strict control of hypertension with blood pressure goal below 130/90, diabetes with hemoglobin A1c goal below 6.5% and cholesterol with LDL cholesterol (bad cholesterol) goal below 70 mg/dL. I also advised the patient to eat a healthy diet with plenty of whole grains, cereals, fruits and vegetables, exercise regularly and maintain ideal body weight.  Follow up in 6 months or call earlier if needed   Greater than 50% of time during this 25 minute visit was spent on counseling,explanation of diagnosis of possible right brain stroke, reviewing risk factor management of HLD and HTN, planning of further management, discussion with patient and family and coordination of care   Venancio Poisson, [facility]  [facility] [address] [place], [place] [address]  Phone [phone] Fax [phone]

## 2018-02-06 NOTE — Telephone Encounter (Signed)
New Message   Pt states she had a stroke on 6/13 and she was suppose to had made a f/u but one was never made for her, advise of 1st available but she states she needs to be seen next week and wants to speak with a nurse. Please call

## 2018-02-06 NOTE — Patient Instructions (Addendum)
Continue aspirin 81 mg daily and Eliquis (apixaban) daily  and lipitor  for secondary stroke prevention  Follow up with cardiologist regarding atrial fibrillation and eliquis management  Continue to follow up with PCP regarding cholesterol and blood pressure management   Continue to monitor blood pressure at home  Maintain strict control of hypertension with blood pressure goal below 130/90, diabetes with hemoglobin A1c goal below 6.5% and cholesterol with LDL cholesterol (bad cholesterol) goal below 70 mg/dL. I also advised the patient to eat a healthy diet with plenty of whole grains, cereals, fruits and vegetables, exercise regularly and maintain ideal body weight.  Followup in the future with me in 6 months or call earlier if needed        Thank you for coming to see [place] at [facility]. I hope we have been able to provide you high quality care today.  You may receive a patient satisfaction survey over the next few weeks. We would appreciate your feedback and comments so that we may continue to improve ourselves and the health of our patients.

## 2018-02-06 NOTE — Telephone Encounter (Signed)
Spoke with pt. She needs to schedule her post hospital OV. I told her I would send a message to scheduling to arrange this.   Pt also requests hand written prescriptions which she takes to [facility] to get her Eliquis for free. Pt will be by on Wed on 7/24 to pick up scripts and a sample of Eliquis with a 30 day card.

## 2018-02-07 NOTE — Progress Notes (Signed)
I agree with the above plan 

## 2018-02-09 ENCOUNTER — Telehealth: Payer: Self-pay | Admitting: Internal Medicine

## 2018-02-09 NOTE — Telephone Encounter (Signed)
Pt asking if should take Asa 81 mg in addtion to the Eliquis. Will forward to Dr Caryl Comes for review .Adonis Housekeeper

## 2018-02-09 NOTE — Telephone Encounter (Signed)
New Message:       Pt c/o medication issue:  1. Name of Medication: aspirin EC 81 MG tablet  2. How are you currently taking this medication (dosage and times per day)? Take 1 tablet (81 mg total) by mouth daily.  3. Are you having a reaction (difficulty breathing--STAT)? No  4. What is your medication issue? Pt states she needs to know if she is supposed to stop taking this medication when she starts her Eliquis.

## 2018-02-10 NOTE — Telephone Encounter (Signed)
Pt calling because she didn't get a call back from previous message.

## 2018-02-11 NOTE — Telephone Encounter (Signed)
Per Dr Caryl Comes, pt may come off of baby asa. She has no hx of open heart or heart stents. Pt verbalized understanding and had no additional questions.

## 2018-02-22 ENCOUNTER — Other Ambulatory Visit: Payer: Self-pay | Admitting: Internal Medicine

## 2018-02-22 ENCOUNTER — Other Ambulatory Visit: Payer: Self-pay | Admitting: Family Medicine

## 2018-02-22 DIAGNOSIS — M810 Age-related osteoporosis without current pathological fracture: Secondary | ICD-10-CM

## 2018-02-26 DIAGNOSIS — M5440 Lumbago with sciatica, unspecified side: Secondary | ICD-10-CM | POA: Diagnosis not present

## 2018-02-26 DIAGNOSIS — M545 Low back pain: Secondary | ICD-10-CM | POA: Diagnosis not present

## 2018-02-26 DIAGNOSIS — M9904 Segmental and somatic dysfunction of sacral region: Secondary | ICD-10-CM | POA: Diagnosis not present

## 2018-02-26 DIAGNOSIS — M9903 Segmental and somatic dysfunction of lumbar region: Secondary | ICD-10-CM | POA: Diagnosis not present

## 2018-03-04 ENCOUNTER — Ambulatory Visit (INDEPENDENT_AMBULATORY_CARE_PROVIDER_SITE_OTHER): Payer: Medicare Other | Admitting: *Deleted

## 2018-03-04 DIAGNOSIS — R001 Bradycardia, unspecified: Secondary | ICD-10-CM | POA: Diagnosis not present

## 2018-03-04 DIAGNOSIS — I495 Sick sinus syndrome: Secondary | ICD-10-CM

## 2018-03-04 DIAGNOSIS — M25522 Pain in left elbow: Secondary | ICD-10-CM | POA: Diagnosis not present

## 2018-03-04 NOTE — Progress Notes (Signed)
Remote pacemaker transmission.   

## 2018-03-11 DIAGNOSIS — M25522 Pain in left elbow: Secondary | ICD-10-CM | POA: Diagnosis not present

## 2018-03-25 DIAGNOSIS — M1712 Unilateral primary osteoarthritis, left knee: Secondary | ICD-10-CM | POA: Diagnosis not present

## 2018-03-25 DIAGNOSIS — M1711 Unilateral primary osteoarthritis, right knee: Secondary | ICD-10-CM | POA: Diagnosis not present

## 2018-03-26 ENCOUNTER — Other Ambulatory Visit: Payer: Self-pay | Admitting: Family Medicine

## 2018-03-26 DIAGNOSIS — Z1231 Encounter for screening mammogram for malignant neoplasm of breast: Secondary | ICD-10-CM

## 2018-03-29 LAB — CUP PACEART REMOTE DEVICE CHECK
Battery Impedance: 1504 Ohm
Battery Remaining Longevity: 41 mo
Battery Voltage: 2.76 V
Brady Statistic AP VP Percent: 0 %
Brady Statistic AP VS Percent: 96 %
Brady Statistic AS VP Percent: 0 %
Brady Statistic AS VS Percent: 4 %
Date Time Interrogation Session: 20190814143626
Implantable Lead Implant Date: 20121213
Implantable Lead Implant Date: 20121213
Implantable Lead Location: 753859
Implantable Lead Location: 753860
Implantable Lead Model: 4076
Implantable Lead Model: 4076
Implantable Lead Serial Number: 835025
Implantable Lead Serial Number: 869269
Implantable Pulse Generator Implant Date: 20121213
Lead Channel Impedance Value: 398 Ohm
Lead Channel Impedance Value: 548 Ohm
Lead Channel Pacing Threshold Amplitude: 0.5 V
Lead Channel Pacing Threshold Amplitude: 0.5 V
Lead Channel Pacing Threshold Pulse Width: 0.4 ms
Lead Channel Pacing Threshold Pulse Width: 0.4 ms
Lead Channel Setting Pacing Amplitude: 2 V
Lead Channel Setting Pacing Amplitude: 2.5 V
Lead Channel Setting Pacing Pulse Width: 0.4 ms
Lead Channel Setting Sensing Sensitivity: 5.6 mV

## 2018-04-22 DIAGNOSIS — Z1389 Encounter for screening for other disorder: Secondary | ICD-10-CM | POA: Diagnosis not present

## 2018-04-22 DIAGNOSIS — E785 Hyperlipidemia, unspecified: Secondary | ICD-10-CM | POA: Diagnosis not present

## 2018-04-22 DIAGNOSIS — N3281 Overactive bladder: Secondary | ICD-10-CM | POA: Diagnosis not present

## 2018-04-22 DIAGNOSIS — I10 Essential (primary) hypertension: Secondary | ICD-10-CM | POA: Diagnosis not present

## 2018-04-22 DIAGNOSIS — F411 Generalized anxiety disorder: Secondary | ICD-10-CM | POA: Diagnosis not present

## 2018-04-22 DIAGNOSIS — I4891 Unspecified atrial fibrillation: Secondary | ICD-10-CM | POA: Diagnosis not present

## 2018-04-22 DIAGNOSIS — Z23 Encounter for immunization: Secondary | ICD-10-CM | POA: Diagnosis not present

## 2018-04-22 DIAGNOSIS — M810 Age-related osteoporosis without current pathological fracture: Secondary | ICD-10-CM | POA: Diagnosis not present

## 2018-05-04 ENCOUNTER — Ambulatory Visit
Admission: RE | Admit: 2018-05-04 | Discharge: 2018-05-04 | Disposition: A | Discharge status: Home / Self Care | Payer: Medicare Other | Source: Ambulatory Visit | Attending: Family Medicine | Admitting: Family Medicine

## 2018-05-04 DIAGNOSIS — Z1231 Encounter for screening mammogram for malignant neoplasm of breast: Secondary | ICD-10-CM | POA: Diagnosis not present

## 2018-05-29 DIAGNOSIS — L814 Other melanin hyperpigmentation: Secondary | ICD-10-CM | POA: Diagnosis not present

## 2018-05-29 DIAGNOSIS — Z85828 Personal history of other malignant neoplasm of skin: Secondary | ICD-10-CM | POA: Diagnosis not present

## 2018-05-29 DIAGNOSIS — L570 Actinic keratosis: Secondary | ICD-10-CM | POA: Diagnosis not present

## 2018-05-29 DIAGNOSIS — L821 Other seborrheic keratosis: Secondary | ICD-10-CM | POA: Diagnosis not present

## 2018-05-29 DIAGNOSIS — D1801 Hemangioma of skin and subcutaneous tissue: Secondary | ICD-10-CM | POA: Diagnosis not present

## 2018-06-03 ENCOUNTER — Ambulatory Visit (INDEPENDENT_AMBULATORY_CARE_PROVIDER_SITE_OTHER): Payer: Medicare Other | Admitting: *Deleted

## 2018-06-03 DIAGNOSIS — R001 Bradycardia, unspecified: Secondary | ICD-10-CM

## 2018-06-03 DIAGNOSIS — I495 Sick sinus syndrome: Secondary | ICD-10-CM | POA: Diagnosis not present

## 2018-06-03 NOTE — Progress Notes (Signed)
Remote pacemaker transmission.   

## 2018-06-05 ENCOUNTER — Ambulatory Visit: Payer: Medicare Other

## 2018-06-08 DIAGNOSIS — M1711 Unilateral primary osteoarthritis, right knee: Secondary | ICD-10-CM | POA: Diagnosis not present

## 2018-07-01 DIAGNOSIS — G4733 Obstructive sleep apnea (adult) (pediatric): Secondary | ICD-10-CM | POA: Diagnosis not present

## 2018-07-01 DIAGNOSIS — H40013 Open angle with borderline findings, low risk, bilateral: Secondary | ICD-10-CM | POA: Diagnosis not present

## 2018-07-01 DIAGNOSIS — Z961 Presence of intraocular lens: Secondary | ICD-10-CM | POA: Diagnosis not present

## 2018-07-01 DIAGNOSIS — H5211 Myopia, right eye: Secondary | ICD-10-CM | POA: Diagnosis not present

## 2018-07-01 DIAGNOSIS — H35363 Drusen (degenerative) of macula, bilateral: Secondary | ICD-10-CM | POA: Diagnosis not present

## 2018-07-01 DIAGNOSIS — H04123 Dry eye syndrome of bilateral lacrimal glands: Secondary | ICD-10-CM | POA: Diagnosis not present

## 2018-07-01 DIAGNOSIS — H353133 Nonexudative age-related macular degeneration, bilateral, advanced atrophic without subfoveal involvement: Secondary | ICD-10-CM | POA: Diagnosis not present

## 2018-07-01 DIAGNOSIS — H524 Presbyopia: Secondary | ICD-10-CM | POA: Diagnosis not present

## 2018-07-01 DIAGNOSIS — H52222 Regular astigmatism, left eye: Secondary | ICD-10-CM | POA: Diagnosis not present

## 2018-08-02 LAB — CUP PACEART REMOTE DEVICE CHECK
Battery Impedance: 1563 Ohm
Battery Remaining Longevity: 39 mo
Battery Voltage: 2.76 V
Brady Statistic AP VP Percent: 0 %
Brady Statistic AP VS Percent: 91 %
Brady Statistic AS VP Percent: 1 %
Brady Statistic AS VS Percent: 8 %
Date Time Interrogation Session: 20191113150538
Implantable Lead Implant Date: 20121213
Implantable Lead Implant Date: 20121213
Implantable Lead Location: 753859
Implantable Lead Location: 753860
Implantable Lead Model: 4076
Implantable Lead Model: 4076
Implantable Lead Serial Number: 835025
Implantable Lead Serial Number: 869269
Implantable Pulse Generator Implant Date: 20121213
Lead Channel Impedance Value: 393 Ohm
Lead Channel Impedance Value: 518 Ohm
Lead Channel Pacing Threshold Amplitude: 0.5 V
Lead Channel Pacing Threshold Amplitude: 0.625 V
Lead Channel Pacing Threshold Pulse Width: 0.4 ms
Lead Channel Pacing Threshold Pulse Width: 0.4 ms
Lead Channel Setting Pacing Amplitude: 2 V
Lead Channel Setting Pacing Amplitude: 2.5 V
Lead Channel Setting Pacing Pulse Width: 0.4 ms
Lead Channel Setting Sensing Sensitivity: 5.6 mV

## 2018-08-07 DIAGNOSIS — L814 Other melanin hyperpigmentation: Secondary | ICD-10-CM | POA: Diagnosis not present

## 2018-08-07 DIAGNOSIS — L570 Actinic keratosis: Secondary | ICD-10-CM | POA: Diagnosis not present

## 2018-08-07 DIAGNOSIS — L820 Inflamed seborrheic keratosis: Secondary | ICD-10-CM | POA: Diagnosis not present

## 2018-08-07 DIAGNOSIS — L821 Other seborrheic keratosis: Secondary | ICD-10-CM | POA: Diagnosis not present

## 2018-08-07 DIAGNOSIS — D1801 Hemangioma of skin and subcutaneous tissue: Secondary | ICD-10-CM | POA: Diagnosis not present

## 2018-08-07 DIAGNOSIS — Z85828 Personal history of other malignant neoplasm of skin: Secondary | ICD-10-CM | POA: Diagnosis not present

## 2018-08-10 ENCOUNTER — Other Ambulatory Visit (HOSPITAL_COMMUNITY): Payer: Self-pay | Admitting: Interventional Radiology

## 2018-08-10 DIAGNOSIS — I771 Stricture of artery: Secondary | ICD-10-CM

## 2018-08-13 ENCOUNTER — Ambulatory Visit (HOSPITAL_COMMUNITY)
Admission: RE | Admit: 2018-08-13 | Discharge: 2018-08-13 | Disposition: A | Discharge status: Home / Self Care | Payer: Medicare Other | Source: Ambulatory Visit | Attending: Interventional Radiology | Admitting: Interventional Radiology

## 2018-08-13 DIAGNOSIS — I771 Stricture of artery: Secondary | ICD-10-CM | POA: Insufficient documentation

## 2018-08-14 ENCOUNTER — Encounter: Payer: Self-pay | Admitting: Adult Health

## 2018-08-14 ENCOUNTER — Ambulatory Visit (INDEPENDENT_AMBULATORY_CARE_PROVIDER_SITE_OTHER): Payer: Medicare Other | Admitting: Adult Health

## 2018-08-14 VITALS — BP 127/79 | HR 100 | Ht 68.0 in | Wt 199.6 lb

## 2018-08-14 DIAGNOSIS — Z9989 Dependence on other enabling machines and devices: Secondary | ICD-10-CM

## 2018-08-14 DIAGNOSIS — E785 Hyperlipidemia, unspecified: Secondary | ICD-10-CM | POA: Diagnosis not present

## 2018-08-14 DIAGNOSIS — I10 Essential (primary) hypertension: Secondary | ICD-10-CM

## 2018-08-14 DIAGNOSIS — I6300 Cerebral infarction due to thrombosis of unspecified precerebral artery: Secondary | ICD-10-CM

## 2018-08-14 DIAGNOSIS — G4733 Obstructive sleep apnea (adult) (pediatric): Secondary | ICD-10-CM

## 2018-08-14 DIAGNOSIS — I480 Paroxysmal atrial fibrillation: Secondary | ICD-10-CM

## 2018-08-14 NOTE — Patient Instructions (Addendum)
Continue Eliquis (apixaban) daily  and atorvastatin (Lipitor) for secondary stroke prevention  We will check cholesterol levels today   Continue to follow up with PCP regarding cholesterol and blood pressure management   Continue to follow with vascular surgery Dr. Estanislado Pandy as recommended  Maintain strict control of hypertension with blood pressure goal below 130/90, diabetes with hemoglobin A1c goal below 6.5% and cholesterol with LDL cholesterol (bad cholesterol) goal below 70 mg/dL. I also advised the patient to eat a healthy diet with plenty of whole grains, cereals, fruits and vegetables, exercise regularly and maintain ideal body weight.  Followup in the future with me in 6 months or call earlier if needed       Thank you for coming to see [place] at [facility]. I hope we have been able to provide you high quality care today.  You may receive a patient satisfaction survey over the next few weeks. We would appreciate your feedback and comments so that we may continue to improve ourselves and the health of our patients.

## 2018-08-14 NOTE — Progress Notes (Signed)
[facility] [address] [place]. [place] [address] [phone]       OFFICE FOLLOW UP NOTE  [name] Date of Birth:  [birth year] Medical Record Number:  [id number]   Reason for Referral:  hospital stroke follow up  CHIEF COMPLAINT:  Chief Complaint  Patient presents with  . Follow-up    6 month follow up. Alone. Treatment room. No new concerns at this time.     HPI:  Brittany [name] is a 85 y.o. female who is being followed in this office for possible right brain stroke (unable to obtain MRI d/t pacer) on 12/30/2017.  PMH of HTN, pacemaker, atrial fibrillation, PVD, HLD, OSA on CPAP PSVT.  She presented with left facial weakness, left arm weakness/numbness and slurred speech.  CT did not show acute infarct but CTA showed distal right M2 branch occlusion and bilateral ICA calcified plaque with right ICA 70% stenosis.  Underwent cerebral angiogram which showed small basilar apex aneurysm and right ICA 60% stenosis and right MCA reconstitution.  All imaging from hospitalization described in detail below and personally reviewed.  Eliquis initiated along with aspirin 81 mg for atrial fibrillation and secondary stroke prevention as she was not on DO AC prior to admission.  Patient initially evaluated in this office after hospital discharge on 02/06/2018.  She denied any residual deficits or recurring symptoms.  She continued on all medications including Eliquis, aspirin 81 mg and atorvastatin without reported side effects.  Possible situational anxiety vs post stroke depression but declined additional depression management.  She returns today for 70-month follow-up visit.  She continues to do well from a stroke standpoint.  She continues to follow with vascular surgery and did obtain carotid ultrasound on 08/13/2018 which showed right ICA 40 to 59% stenosis and left ICA 40 to 59% stenosis.  Unsure of need of future imaging and/or follow-up with vascular surgery.  She  continues on Eliquis without side effects of bleeding or bruising.  She continues to follow with cardiology regularly for management.  Continues on atorvastatin without side effects myalgias. She has not had lipid panel obtained since hospital discharge. Blood pressure today satisfactory at 127/79. She does not monitor at home. She does endorse compliance with CPAP for OSA management.  Denies new or worsening stroke/TIA symptoms.    ROS:   14 system review of systems performed and negative with exception of apnea, joint pain and frequency of urination   PMH:  Past Medical History:  Diagnosis Date  . Ankle fracture 2016  . Atrial fibrillation ([place])   . Depression   . Elevated parathyroid hormone   . Fracture of orbital floor with routine healing   . GERD (gastroesophageal reflux disease)   . History of colon polyps    benign  . Hypercalcemia   . Hypertension   . Hyponatremia   . Hypothyroid   . Macular degeneration    wet in the right and dry in the left  . OSA on CPAP   . Osteoporosis   . Peripheral vascular disease ([place])   . PSVT (paroxysmal supraventricular tachycardia) ([place])   . Rotator cuff tear   . Sinus node dysfunction ([facility])    a. s/p MDT pacemaker  . Stroke ([place])   . Urinary incontinence   . Wrist fracture 2018    PSH:  Past Surgical History:  Procedure Laterality Date  . BACK SURGERY    . CATARACT EXTRACTION    . CATARACT EXTRACTION W/  INTRAOCULAR LENS  IMPLANT, BILATERAL Bilateral   . COLONOSCOPY    . ESOPHAGOGASTRODUODENOSCOPY    . IR ANGIO INTRA EXTRACRAN SEL COM CAROTID INNOMINATE BILAT MOD SED  12/30/2017  . IR ANGIO VERTEBRAL SEL VERTEBRAL BILAT MOD SED  12/30/2017  . LAPAROSCOPIC CHOLECYSTECTOMY  08/11/1999  . LUMBAR DISC SURGERY  02/2014  . ORIF ANKLE FRACTURE Left 04/13/2015   Procedure: OPEN REDUCTION INTERNAL FIXATION (ORIF) LEFT ANKLE FRACTURE;  Surgeon: Meredith Pel, MD;  Location: [facility];  Service: Orthopedics;  Laterality: Left;  . PACEMAKER  PLACEMENT Right 2012   a. MDT dual chamber PPM implanted by Dr Caryl Comes   . SHOULDER ARTHROSCOPY W/ ROTATOR CUFF REPAIR Right 08/30/2014   WITH MINI-OPEN ROTATOR CUFF REPAIR AND SUBACROMIAL DECOMPRESSION  . SHOULDER ARTHROSCOPY WITH ROTATOR CUFF REPAIR AND SUBACROMIAL DECOMPRESSION Right 08/30/2014   Procedure: SHOULDER ARTHROSCOPY WITH MINI-OPEN ROTATOR CUFF REPAIR AND SUBACROMIAL DECOMPRESSION;  Surgeon: Meredith Pel, MD;  Location: [facility];  Service: Orthopedics;  Laterality: Right;  RIGHT SHOULDER DIAGNOSTIC OPERATIVE ARTHROSCOPY, SUBACROMIAL DECOMPRESSION, MINI-OPEN ROTATOR CUFF REPAIR.  Mar[facility] Kitchen VAGINAL HYSTERECTOMY  1970    Social History:  Social History   Socioeconomic History  . Marital status: Married    Spouse name: Not on file  . Number of children: 4  . Years of education: 76  . Highest education level: Not on file  Occupational History  . 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 Financial resource strain: Not on file  . Food insecurity:    Worry: Not on file    Inability: Not on file  . Transportation needs:    Medical: Not on file    Non-medical: Not on file  Tobacco Use  . Smoking status: Former Smoker    Packs/day: 1.00    Years: 45.00    Pack years: 45.00    Types: Cigarettes  . Smokeless tobacco: Never Used  . Tobacco comment: quit smoking in 1992  Substance and Sexual Activity  . Alcohol use: Yes    Alcohol/week: 28.0 standard drinks    Types: 28 Glasses of wine per week    Comment: 08/30/2014 "16oz wine q night"  . Drug use: No  . Sexual activity: Yes    Birth control/protection: Surgical  Lifestyle  . Physical activity:    Days per week: Not on file    Minutes per session: Not on file  . Stress: Not on file  Relationships  . Social connections:    Talks on phone: Not on file    Gets together: Not on file    Attends religious service: Not on file    Active member of club or organization: Not on file    Attends meetings of  clubs or organizations: Not on file    Relationship status: Not on file  . Intimate partner violence:    Fear of current or ex partner: Not on file    Emotionally abused: Not on file    Physically abused: Not on file    Forced sexual activity: Not on file  Other Topics Concern  . Not on file  Social History Narrative   Ms. Mccarrick lives with her husband at [place] [employer]. They are retired and recently relocated to [facility] from [place]. She has 4 grown children.    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    Family History:  Family History  Problem Relation Age of Onset  .  Colon cancer Mother        colon  . Cancer Father        unknown type  . Stroke Sister   . Skin cancer Brother   . Heart disease Brother   . Breast cancer Paternal Aunt   . Asthma Brother     Medications:   Current Outpatient Medications on File Prior to Visit  Medication Sig Dispense Refill  . alendronate (FOSAMAX) 70 MG tablet Take 1 tablet (70 mg total) by mouth every 7 (seven) days. Take with a full glass of water on an empty stomach. 4 tablet 11  . amLODipine (NORVASC) 10 MG tablet Take 1 tablet (10 mg total) by mouth daily. 90 tablet 1  . apixaban (ELIQUIS) 5 MG TABS tablet Take 1 tablet (5 mg total) by mouth 2 (two) times daily. 60 tablet 3  . atorvastatin (LIPITOR) 40 MG tablet Take 1 tablet (40 mg total) by mouth daily at 6 PM. 30 tablet 3  . B Complex-C (B-COMPLEX WITH VITAMIN C) tablet Take 1 tablet by mouth daily.    . Cholecalciferol (VITAMIN D3) 5000 UNITS CAPS Take 1 capsule by mouth daily.     . citalopram (CELEXA) 20 MG tablet TAKE 1 TABLET (20 MG TOTAL) BY MOUTH DAILY. 90 tablet 1  . lisinopril (PRINIVIL,ZESTRIL) 20 MG tablet Take 1 tablet (20 mg total) by mouth daily. 90 tablet 1  . Lutein 20 MG TABS Take 1 tablet by mouth.     . magnesium oxide (MAG-OX) 400 MG tablet Take 1 tablet (400 mg total) by mouth daily. 30 tablet 0  . Multiple Vitamin  (MULTIVITAMIN WITH MINERALS) TABS tablet Take 1 tablet by mouth daily.    . Multiple Vitamins-Minerals (PRESERVISION AREDS PO) Take 1 tablet by mouth 2 (two) times daily.    . sotalol (BETAPACE) 80 MG tablet TAKE 1 AND 1/2 TABLETS BY MOUTH TWICE A DAY. Please make yearly appt with Dr. Caryl Comes for May. 1st attempt 270 tablet 0  . sotalol (BETAPACE) 80 MG tablet TAKE 1 AND 1/2 TABLETS BY MOUTH TWICE A DAY 270 tablet 2   No current facility-administered medications on file prior to visit.     Allergies:   Allergies  Allergen Reactions  . Adhesive [Tape] Swelling and Rash  . Benzalkonium Chloride Rash  . Neosporin [Neomycin-Polymyxin-Gramicidin] Swelling and Rash     Physical Exam  Vitals:   08/14/18 1015  BP: 127/79  Pulse: 100  Weight: 199 lb 9.6 oz (90.5 kg)  Height: 5\' 8"  (1.727 m)   Body mass index is 30.35 kg/m. No exam data present  General: well developed, well nourished, pleasant elderly Caucasian female, seated, in no evident distress Head: head normocephalic and atraumatic.   Neck: supple with no carotid or supraclavicular bruits Cardiovascular: regular rate and rhythm, no murmurs Musculoskeletal: no deformity Skin:  no rash/petichiae Vascular:  Normal pulses all extremities  Neurologic Exam Mental Status: Awake and fully alert. Oriented to place and time. Recent and remote memory intact. Attention span, concentration and fund of knowledge appropriate. Mood and affect appropriate.  Cranial Nerves: Pupils equal, briskly reactive to light. Extraocular movements full without nystagmus. Visual fields full to confrontation. Hearing intact. Facial sensation intact. Face, tongue, palate moves normally and symmetrically.  Motor: Normal bulk and tone. Normal strength in all tested extremity muscles. Sensory.: intact to touch , pinprick , position and vibratory sensation.  Coordination: Rapid alternating movements normal in all extremities. Finger-to-nose and heel-to-shin  performed accurately bilaterally. Gait  and Station: Arises from chair without difficulty. Stance is normal. Gait demonstrates normal stride length and balance with mild favoring of right leg due to knee pain Reflexes: 1+ and symmetric. Toes downgoing.      Diagnostic Data (Labs, Imaging, Testing)  CT HEAD WO CONTRAST 12/30/17 IMPRESSION: 1. No acute abnormality.  No change from the prior study. 2. ASPECTS is 10 3. Moderate atrophy and moderate chronic microvascular ischemia in the white matter.  CT ANGIO HEAD W OR WO CONTRAST CT ANGIO NECK W OR WO CONTRAST 12/30/17 IMPRESSION: 1. Distal right M2 branch occlusion. 2. Heavily calcified plaque about both carotid bifurcations with 70% proximal right ICA stenosis. 3. Severe right vertebral artery origin stenosis.  CEREBRAL ANGIOGRAM 01/01/18 IMPRESSION: Approximately 60% stenosis of the right internal carotid artery at the bulb by the NASCET criteria. Less than 30% stenosis of the left internal carotid artery at the bulb. Approximately 50% stenosis of the right external carotid artery origin. Approximately 4 mm x 4.1 mm basilar apex aneurysm. PLAN: Findings were reviewed with the patient. The following day the patient remained clinically fully intact. The right groin appeared soft. Distal pulses were palpable in the dorsalis pedis, and posterior tibial regions. Following discharge, the patient will be followed in the Neuro Interventional clinic with ultrasound of the carotids in 3 weeks Time.  ECHOCARDIOGRAM 12/31/17 Study Conclusions - Left ventricle: The cavity size was normal. Wall thickness was   normal. Systolic function was normal. The estimated ejection   fraction was in the range of 55% to 60%. Wall motion was normal;   there were no regional wall motion abnormalities. There was a   reduced contribution of atrial contraction to ventricular   filling, due to increased ventricular diastolic pressure or   atrial  contractile dysfunction (consider atrial mechanical   stunning after recentt atrial fibrillation). - Aortic valve: There was mild regurgitation. - Left atrium: The atrium was mildly dilated.  VAS UR CAROTID DUPLEX BILATERAL 01/26/18 Final Interpretation: Right Carotid: Velocities in the right ICA are consistent with a 40-59%        stenosis. Significant mixed plaque throughout, difficult to        evaluate bifrucation. Left Carotid: Velocities in the left ICA are consistent with a 1-39% stenosis.       Significant mixed plaque throughout, difficult to evaluate       bifrucation. Vertebrals: Bilateral vertebral arteries demonstrate antegrade flow.     ASSESSMENT: Brittany [name] is a 85 y.o. year old female here with possible right brain infarct on 12/30/17 secondary to AF not on [facility] Vascular risk factors include AF, HTN, and HLD.  Patient is being seen today for follow-up visit and overall continues to do well from a stroke standpoint without residual deficits or recurring of symptoms.   PLAN: -Continue Eliquis (apixaban) daily  and lipitor  for secondary stroke prevention -Obtain lipid panel today as this has not been completed since hospital discharge to ensure adequate management of HDL with current statin use -F/u with PCP regarding your HLD and HTN management -f/u with cardiologist regarding AF and eliquis management -f/u with vascular surgery and advised patient to call office mid next week if she has not received a call by that time regarding ultrasound results and possible need of future follow-up and/or imaging -Advised to continue to stay active imitate a healthy diet -advised to follow up with PCP regarding increased depression as currently declining additional medication -Maintain strict control of hypertension with blood pressure  goal below 130/90, diabetes with hemoglobin A1c goal below 6.5% and cholesterol with LDL cholesterol (bad  cholesterol) goal below 70 mg/dL. I also advised the patient to eat a healthy diet with plenty of whole grains, cereals, fruits and vegetables, exercise regularly and maintain ideal body weight.  Follow up in 6 months or call earlier if needed   Greater than 50% of time during this 25 minute visit was spent on counseling,explanation of diagnosis of possible right brain stroke, reviewing risk factor management of HLD and HTN, planning of further management, discussion with patient and family and coordination of care   Venancio Poisson, [facility]  [facility] [address] [place], [place] [address]  Phone [phone] Fax [phone]

## 2018-08-15 LAB — LIPID PANEL
Chol/HDL Ratio: 2.4 ratio (ref 0.0–4.4)
Cholesterol, Total: 163 mg/dL (ref 100–199)
HDL: 69 mg/dL (ref >39)
LDL Calculated: 67 mg/dL (ref 0–99)
Triglycerides: 133 mg/dL (ref 0–149)
VLDL Cholesterol Cal: 27 mg/dL (ref 5–40)

## 2018-08-17 ENCOUNTER — Telehealth: Payer: Self-pay

## 2018-08-17 ENCOUNTER — Telehealth (HOSPITAL_COMMUNITY): Payer: Self-pay

## 2018-08-17 NOTE — Telephone Encounter (Signed)
I called patient to give lab work results. I stated her recent cholesterol panel looked good and to continue current treatment regimen. Pt verbalized understanding. ------

## 2018-08-17 NOTE — Telephone Encounter (Signed)
-----   Message from Venancio Poisson, NP sent at 08/17/2018  6:47 AM EST ----- Please advise patient that her recent cholesterol panel looked good and to continue her current treatment regimen.

## 2018-08-17 NOTE — Telephone Encounter (Signed)
Pt agreed to f/u in 6 months with us carotid. AW 

## 2018-08-18 NOTE — Progress Notes (Signed)
I agree with the above plan 

## 2018-09-02 ENCOUNTER — Ambulatory Visit (INDEPENDENT_AMBULATORY_CARE_PROVIDER_SITE_OTHER): Payer: Medicare Other

## 2018-09-02 DIAGNOSIS — R001 Bradycardia, unspecified: Secondary | ICD-10-CM

## 2018-09-02 DIAGNOSIS — I495 Sick sinus syndrome: Secondary | ICD-10-CM

## 2018-09-02 LAB — CUP PACEART REMOTE DEVICE CHECK
Battery Impedance: 1708 Ohm
Battery Remaining Longevity: 36 mo
Battery Voltage: 2.75 V
Brady Statistic AP VP Percent: 0 %
Brady Statistic AP VS Percent: 85 %
Brady Statistic AS VP Percent: 2 %
Brady Statistic AS VS Percent: 13 %
Date Time Interrogation Session: 20200212125631
Implantable Lead Implant Date: 20121213
Implantable Lead Implant Date: 20121213
Implantable Lead Location: 753859
Implantable Lead Location: 753860
Implantable Lead Model: 4076
Implantable Lead Model: 4076
Implantable Lead Serial Number: 835025
Implantable Lead Serial Number: 869269
Implantable Pulse Generator Implant Date: 20121213
Lead Channel Impedance Value: 384 Ohm
Lead Channel Impedance Value: 492 Ohm
Lead Channel Pacing Threshold Amplitude: 0.5 V
Lead Channel Pacing Threshold Amplitude: 0.625 V
Lead Channel Pacing Threshold Pulse Width: 0.4 ms
Lead Channel Pacing Threshold Pulse Width: 0.4 ms
Lead Channel Setting Pacing Amplitude: 2 V
Lead Channel Setting Pacing Amplitude: 2.5 V
Lead Channel Setting Pacing Pulse Width: 0.4 ms
Lead Channel Setting Sensing Sensitivity: 4 mV

## 2018-09-14 ENCOUNTER — Ambulatory Visit (HOSPITAL_COMMUNITY)
Admission: RE | Admit: 2018-09-14 | Discharge: 2018-09-14 | Disposition: A | Discharge status: Home / Self Care | Payer: Medicare Other | Source: Ambulatory Visit | Attending: Physician Assistant | Admitting: Physician Assistant

## 2018-09-14 ENCOUNTER — Telehealth: Payer: Self-pay | Admitting: Internal Medicine

## 2018-09-14 ENCOUNTER — Encounter (HOSPITAL_COMMUNITY): Payer: Self-pay | Admitting: Physician Assistant

## 2018-09-14 VITALS — BP 118/64 | HR 103 | Ht 68.0 in | Wt 194.0 lb

## 2018-09-14 DIAGNOSIS — Z8249 Family history of ischemic heart disease and other diseases of the circulatory system: Secondary | ICD-10-CM | POA: Insufficient documentation

## 2018-09-14 DIAGNOSIS — Z823 Family history of stroke: Secondary | ICD-10-CM | POA: Diagnosis not present

## 2018-09-14 DIAGNOSIS — E039 Hypothyroidism, unspecified: Secondary | ICD-10-CM | POA: Insufficient documentation

## 2018-09-14 DIAGNOSIS — Z7901 Long term (current) use of anticoagulants: Secondary | ICD-10-CM | POA: Diagnosis not present

## 2018-09-14 DIAGNOSIS — I480 Paroxysmal atrial fibrillation: Secondary | ICD-10-CM | POA: Insufficient documentation

## 2018-09-14 DIAGNOSIS — E663 Overweight: Secondary | ICD-10-CM | POA: Insufficient documentation

## 2018-09-14 DIAGNOSIS — Z8673 Personal history of transient ischemic attack (TIA), and cerebral infarction without residual deficits: Secondary | ICD-10-CM | POA: Insufficient documentation

## 2018-09-14 DIAGNOSIS — Z87891 Personal history of nicotine dependence: Secondary | ICD-10-CM | POA: Insufficient documentation

## 2018-09-14 DIAGNOSIS — G4733 Obstructive sleep apnea (adult) (pediatric): Secondary | ICD-10-CM | POA: Insufficient documentation

## 2018-09-14 DIAGNOSIS — I739 Peripheral vascular disease, unspecified: Secondary | ICD-10-CM | POA: Insufficient documentation

## 2018-09-14 DIAGNOSIS — Z79899 Other long term (current) drug therapy: Secondary | ICD-10-CM | POA: Insufficient documentation

## 2018-09-14 DIAGNOSIS — Z6829 Body mass index (BMI) 29.0-29.9, adult: Secondary | ICD-10-CM | POA: Insufficient documentation

## 2018-09-14 DIAGNOSIS — K219 Gastro-esophageal reflux disease without esophagitis: Secondary | ICD-10-CM | POA: Diagnosis not present

## 2018-09-14 DIAGNOSIS — Z86010 Personal history of colonic polyps: Secondary | ICD-10-CM | POA: Insufficient documentation

## 2018-09-14 NOTE — Telephone Encounter (Signed)
° ° ° °  1) What is your heart rate? At 2am HR was 180, then at 8am  HR was 121  2) Do you have a log of your heart rate readings (document readings)? 126,109, 134, 128, 80, 126, 99  3) Do you have any other symptoms? no

## 2018-09-14 NOTE — Telephone Encounter (Signed)
Pt has agreed to be seen by our AF clinic today for management. Directions were given with parking code.   AF clinic was called to alert them of the add on today.   Pt has no additional questions.

## 2018-09-14 NOTE — Progress Notes (Signed)
Primary Care Physician: Donald Prose, MD Primary Electrophysiologist: Dr Caryl Comes Referring Physician: Dr Tora Duck Brittany [name] is a 85 y.o. female with a history of paroxysmal atrial fibrillation, sinus node dysfunction, OSA, and HTN who presents for follow up in the [facility].  The patient was initially diagnosed with atrial fibrillation several years ago and has done well on sotalol. Earlier this AM, patient sent a message about having elevated heart rates. Patient sent manual remote transmission to device clinic which did show atrial tachycardia HR in 150s. Mode switch detection is set at 175 bpm so episode was not monitored. She has been fairly asymptomatic of her fast rates and denies any palpitations, SOB, or fatigue. She was only aware of her tachycardia when she laid on her pillow and could hear her heartbeat. No triggers that she could identify.   Today, she denies symptoms of palpitations, chest pain, shortness of breath, orthopnea, PND, lower extremity edema, dizziness, presyncope, syncope, snoring, daytime somnolence, bleeding, or neurologic sequela. The patient is tolerating medications without difficulties and is otherwise without complaint today.    Atrial Fibrillation Risk Factors:  she does have symptoms or diagnosis of sleep apnea. she is compliant with CPAP therapy. she does not have a history of rheumatic fever. she does have a history of alcohol use. The patient does not have a history of early familial atrial fibrillation or other arrhythmias.  she has a BMI of Body mass index is 29.5 kg/m.Marland Kitchen Filed Weights   09/14/18 1507  Weight: 88 kg    Family History  Problem Relation Age of Onset  . Colon cancer Mother        colon  . Cancer Father        unknown type  . Stroke Sister   . Skin cancer Brother   . Heart disease Brother   . Breast cancer Paternal Aunt   . Asthma Brother      Atrial Fibrillation Management  history:  Previous antiarrhythmic drugs: sotalol Previous cardioversions: none Previous ablations: none CHADS2VASC score: 7 (female, age, HTN, CVA, PVD) Anticoagulation history: Eliquis   Past Medical History:  Diagnosis Date  . Ankle fracture 2016  . Atrial fibrillation ([place])   . Depression   . Elevated parathyroid hormone   . Fracture of orbital floor with routine healing   . GERD (gastroesophageal reflux disease)   . History of colon polyps    benign  . Hypercalcemia   . Hypertension   . Hyponatremia   . Hypothyroid   . Macular degeneration    wet in the right and dry in the left  . OSA on CPAP   . Osteoporosis   . Peripheral vascular disease ([place])   . PSVT (paroxysmal supraventricular tachycardia) ([place])   . Rotator cuff tear   . Sinus node dysfunction ([facility])    a. s/p MDT pacemaker  . Stroke ([place])   . Urinary incontinence   . Wrist fracture 2018   Past Surgical History:  Procedure Laterality Date  . BACK SURGERY    . CATARACT EXTRACTION    . CATARACT EXTRACTION W/ INTRAOCULAR LENS  IMPLANT, BILATERAL Bilateral   . COLONOSCOPY    . ESOPHAGOGASTRODUODENOSCOPY    . IR ANGIO INTRA EXTRACRAN SEL COM CAROTID INNOMINATE BILAT MOD SED  12/30/2017  . IR ANGIO VERTEBRAL SEL VERTEBRAL BILAT MOD SED  12/30/2017  . LAPAROSCOPIC CHOLECYSTECTOMY  08/11/1999  . LUMBAR DISC SURGERY  02/2014  . ORIF ANKLE  FRACTURE Left 04/13/2015   Procedure: OPEN REDUCTION INTERNAL FIXATION (ORIF) LEFT ANKLE FRACTURE;  Surgeon: Meredith Pel, MD;  Location: [facility];  Service: Orthopedics;  Laterality: Left;  . PACEMAKER PLACEMENT Right 2012   a. MDT dual chamber PPM implanted by Dr Caryl Comes   . SHOULDER ARTHROSCOPY W/ ROTATOR CUFF REPAIR Right 08/30/2014   WITH MINI-OPEN ROTATOR CUFF REPAIR AND SUBACROMIAL DECOMPRESSION  . SHOULDER ARTHROSCOPY WITH ROTATOR CUFF REPAIR AND SUBACROMIAL DECOMPRESSION Right 08/30/2014   Procedure: SHOULDER ARTHROSCOPY WITH MINI-OPEN ROTATOR CUFF REPAIR AND SUBACROMIAL  DECOMPRESSION;  Surgeon: Meredith Pel, MD;  Location: [facility];  Service: Orthopedics;  Laterality: Right;  RIGHT SHOULDER DIAGNOSTIC OPERATIVE ARTHROSCOPY, SUBACROMIAL DECOMPRESSION, MINI-OPEN ROTATOR CUFF REPAIR.  Marland Kitchen VAGINAL HYSTERECTOMY  1970    Current Outpatient Medications  Medication Sig Dispense Refill  . acetaminophen (TYLENOL) 325 MG tablet Take 650 mg by mouth every 6 (six) hours as needed.    Marland Kitchen alendronate (FOSAMAX) 70 MG tablet Take 1 tablet (70 mg total) by mouth every 7 (seven) days. Take with a full glass of water on an empty stomach. 4 tablet 11  . amLODipine (NORVASC) 10 MG tablet Take 1 tablet (10 mg total) by mouth daily. 90 tablet 1  . apixaban (ELIQUIS) 5 MG TABS tablet Take 1 tablet (5 mg total) by mouth 2 (two) times daily. 60 tablet 3  . atorvastatin (LIPITOR) 40 MG tablet Take 1 tablet (40 mg total) by mouth daily at 6 PM. 30 tablet 3  . B Complex-C (B-COMPLEX WITH VITAMIN C) tablet Take 1 tablet by mouth daily.    . Cholecalciferol (VITAMIN D3) 5000 UNITS CAPS Take 1 capsule by mouth daily.     . citalopram (CELEXA) 20 MG tablet TAKE 1 TABLET (20 MG TOTAL) BY MOUTH DAILY. 90 tablet 1  . desmopressin (DDAVP) 0.1 MG tablet Take 0.1 mg by mouth as needed.    Marland Kitchen lisinopril (PRINIVIL,ZESTRIL) 20 MG tablet Take 1 tablet (20 mg total) by mouth daily. 90 tablet 1  . Lutein 20 MG TABS Take 1 tablet by mouth.     . magnesium oxide (MAG-OX) 400 MG tablet Take 1 tablet (400 mg total) by mouth daily. 30 tablet 0  . Multiple Vitamin (MULTIVITAMIN WITH MINERALS) TABS tablet Take 1 tablet by mouth daily.    . Multiple Vitamins-Minerals (PRESERVISION AREDS PO) Take 1 tablet by mouth 2 (two) times daily.    . sotalol (BETAPACE) 80 MG tablet TAKE 1 AND 1/2 TABLETS BY MOUTH TWICE A DAY 270 tablet 2   No current facility-administered medications for this encounter.     Allergies  Allergen Reactions  . Adhesive [Tape] Swelling and Rash  . Benzalkonium Chloride Rash  . Neosporin  [Neomycin-Polymyxin-Gramicidin] Swelling and Rash    Social History   Socioeconomic History  . Marital status: Married    Spouse name: Not on file  . Number of children: 4  . Years of education: 57  . Highest education level: Not on file  Occupational History  . 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 Financial resource strain: Not on file  . Food insecurity:    Worry: Not on file    Inability: Not on file  . Transportation needs:    Medical: Not on file    Non-medical: Not on file  Tobacco Use  . Smoking status: Former Smoker    Packs/day: 1.00    Years: 45.00    Pack years: 45.00  Types: Cigarettes  . Smokeless tobacco: Never Used  . Tobacco comment: quit smoking in 1992  Substance and Sexual Activity  . Alcohol use: Yes    Alcohol/week: 28.0 standard drinks    Types: 28 Glasses of wine per week    Comment: 08/30/2014 "16oz wine q night"  . Drug use: No  . Sexual activity: Yes    Birth control/protection: Surgical  Lifestyle  . Physical activity:    Days per week: Not on file    Minutes per session: Not on file  . Stress: Not on file  Relationships  . Social connections:    Talks on phone: Not on file    Gets together: Not on file    Attends religious service: Not on file    Active member of club or organization: Not on file    Attends meetings of clubs or organizations: Not on file    Relationship status: Not on file  . Intimate partner violence:    Fear of current or ex partner: Not on file    Emotionally abused: Not on file    Physically abused: Not on file    Forced sexual activity: Not on file  Other Topics Concern  . Not on file  Social History Narrative   Ms. Valcarcel lives with her husband at [employer]. They are retired and recently relocated to [facility] from [place]. She has 4 grown children.    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     ROS- All systems are reviewed and  negative except as per the HPI above.  Physical Exam: Vitals:   09/14/18 1507  BP: 118/64  Pulse: (!) 103  Weight: 88 kg  Height: 5\' 8"  (1.727 m)    GEN- The patient is well appearing elderly female, alert and oriented x 3 today.   Head- normocephalic, atraumatic Eyes-  Sclera clear, conjunctiva pink Ears- hearing intact Oropharynx- clear Neck- supple  Lungs- Clear to ausculation bilaterally, normal work of breathing Heart- Regular rate and rhythm, no murmurs, rubs or gallops  GI- soft, NT, ND, + BS Extremities- no clubbing, cyanosis, or edema MS- no significant deformity or atrophy Skin- no rash or lesion Psych- euthymic mood, full affect Neuro- strength and sensation are intact  Wt Readings from Last 3 Encounters:  09/14/18 88 kg  08/14/18 90.5 kg  02/06/18 87.5 kg    EKG today demonstrates V-paced rhythm HR 103  Echo 12/31/17 demonstrated  - Left ventricle: The cavity size was normal. Wall thickness was   normal. Systolic function was normal. The estimated ejection   fraction was in the range of 55% to 60%. Wall motion was normal;   there were no regional wall motion abnormalities. There was a   reduced contribution of atrial contraction to ventricular   filling, due to increased ventricular diastolic pressure or   atrial contractile dysfunction (consider atrial mechanical   stunning after recentt atrial fibrillation). - Aortic valve: There was mild regurgitation. - Left atrium: The atrium was mildly dilated.  Device interrogation-see PACEART report  Epic records are reviewed at length today  Assessment and Plan:  1. Paroxysmal atrial fibrillation The patient has paroxysmal atrial fibrillation and atrial flutter vs atrial tachycardia. She presented in atrial tachycardia today, she is asymptomatic.  Device detection rates adjusted. Will bring back in one week to see if arrhythmia is persistent. If it is persistent, will arrange for DCCV.  Patient reports no  missed doses of anticoagulation.  Continue Eliquis 5 mg BID Continue Sotalol 120 mg BID Recheck Bmet/CBC/mag on f/u  This patients CHA2DS2-VASc Score and unadjusted Ischemic Stroke Rate (% per year) is equal to 11.2 % stroke rate/year from a score of 7  Above score calculated as 1 point each if present [CHF, HTN, DM, Vascular=MI/PAD/Aortic Plaque, Age if 65-74, or Female] Above score calculated as 2 points each if present [Age > 75, or Stroke/TIA/TE]   2. Overweight Lifestyle modification was discussed at length including regular exercise and weight reduction. She is participating in Weight Watchers  3. Obstructive sleep apnea The importance of adequate treatment of sleep apnea was discussed today in order to improve our ability to maintain sinus rhythm long term. She is compliant with CPAP.  4. Sinus node dysfunction PPM, plans per device clinic and Dr Caryl Comes.  5. HTN Stable, no changes today.   Follow up in Afib Clinic in one week.  [facility] [address] [place], [place] [address] [phone] 09/14/2018 3:16 PM

## 2018-09-14 NOTE — Telephone Encounter (Signed)
Transmission received and reviewed. Most recent detected AHR episode occurred on 09/14/18 at 07:41, duration 67min 5sec, peak A 178bpm. Presenting rhythm from 10:29 shows AT in the 150s, Vs 72bpm. Mode switch detection rate is programmed at 175bpm so episode in progress is not stored/monitored as an AHR.  Per Chanetta Marshall, NP, okay to schedule in AF Clinic with [place], [place], if patient is agreeable.

## 2018-09-14 NOTE — Telephone Encounter (Signed)
Pt is sending in a remote check to see if she has been in AF.

## 2018-09-14 NOTE — Progress Notes (Signed)
Remote pacemaker transmission.   

## 2018-09-22 ENCOUNTER — Encounter (HOSPITAL_COMMUNITY): Payer: Self-pay | Admitting: Physician Assistant

## 2018-09-22 ENCOUNTER — Ambulatory Visit (HOSPITAL_COMMUNITY)
Admission: RE | Admit: 2018-09-22 | Discharge: 2018-09-22 | Disposition: A | Discharge status: Home / Self Care | Payer: Medicare Other | Source: Ambulatory Visit | Attending: Physician Assistant | Admitting: Physician Assistant

## 2018-09-22 VITALS — BP 142/70 | HR 110 | Ht 68.0 in | Wt 197.0 lb

## 2018-09-22 DIAGNOSIS — I10 Essential (primary) hypertension: Secondary | ICD-10-CM | POA: Insufficient documentation

## 2018-09-22 DIAGNOSIS — Z6829 Body mass index (BMI) 29.0-29.9, adult: Secondary | ICD-10-CM | POA: Diagnosis not present

## 2018-09-22 DIAGNOSIS — E663 Overweight: Secondary | ICD-10-CM | POA: Diagnosis not present

## 2018-09-22 DIAGNOSIS — I480 Paroxysmal atrial fibrillation: Secondary | ICD-10-CM | POA: Diagnosis not present

## 2018-09-22 DIAGNOSIS — M810 Age-related osteoporosis without current pathological fracture: Secondary | ICD-10-CM | POA: Diagnosis not present

## 2018-09-22 DIAGNOSIS — Z7901 Long term (current) use of anticoagulants: Secondary | ICD-10-CM | POA: Insufficient documentation

## 2018-09-22 DIAGNOSIS — F329 Major depressive disorder, single episode, unspecified: Secondary | ICD-10-CM | POA: Insufficient documentation

## 2018-09-22 DIAGNOSIS — G4733 Obstructive sleep apnea (adult) (pediatric): Secondary | ICD-10-CM | POA: Diagnosis not present

## 2018-09-22 DIAGNOSIS — Z79899 Other long term (current) drug therapy: Secondary | ICD-10-CM | POA: Diagnosis not present

## 2018-09-22 DIAGNOSIS — Z87891 Personal history of nicotine dependence: Secondary | ICD-10-CM | POA: Insufficient documentation

## 2018-09-22 DIAGNOSIS — Z8673 Personal history of transient ischemic attack (TIA), and cerebral infarction without residual deficits: Secondary | ICD-10-CM | POA: Insufficient documentation

## 2018-09-22 LAB — BASIC METABOLIC PANEL
Anion gap: 7 (ref 5–15)
BUN: 9 mg/dL (ref 8–23)
CO2: 28 mmol/L (ref 22–32)
Calcium: 10.4 mg/dL — ABNORMAL HIGH (ref 8.9–10.3)
Chloride: 102 mmol/L (ref 98–111)
Creatinine, Ser: 0.61 mg/dL (ref 0.44–1.00)
GFR calc Af Amer: >60 mL/min (ref >60)
GFR calc non Af Amer: >60 mL/min (ref >60)
Glucose, Bld: 104 mg/dL — ABNORMAL HIGH (ref 70–99)
Potassium: 4.4 mmol/L (ref 3.5–5.1)
Sodium: 137 mmol/L (ref 135–145)

## 2018-09-22 LAB — CBC WITH DIFFERENTIAL/PLATELET
Abs Immature Granulocytes: 0.02 10*3/uL (ref 0.00–0.07)
Basophils Absolute: 0 10*3/uL (ref 0.0–0.1)
Basophils Relative: 0 %
Eosinophils Absolute: 0.2 10*3/uL (ref 0.0–0.5)
Eosinophils Relative: 4 %
HCT: 39.7 % (ref 36.0–46.0)
Hemoglobin: 12.9 g/dL (ref 12.0–15.0)
Immature Granulocytes: 0 %
Lymphocytes Relative: 24 %
Lymphs Abs: 1.2 10*3/uL (ref 0.7–4.0)
MCH: 31.7 pg (ref 26.0–34.0)
MCHC: 32.5 g/dL (ref 30.0–36.0)
MCV: 97.5 fL (ref 80.0–100.0)
Monocytes Absolute: 0.6 10*3/uL (ref 0.1–1.0)
Monocytes Relative: 13 %
Neutro Abs: 2.9 10*3/uL (ref 1.7–7.7)
Neutrophils Relative %: 59 %
Platelets: 255 10*3/uL (ref 150–400)
RBC: 4.07 MIL/uL (ref 3.87–5.11)
RDW: 12.8 % (ref 11.5–15.5)
WBC: 4.8 10*3/uL (ref 4.0–10.5)
nRBC: 0 % (ref 0.0–0.2)

## 2018-09-22 LAB — MAGNESIUM: Magnesium: 2.1 mg/dL (ref 1.7–2.4)

## 2018-09-22 NOTE — Progress Notes (Signed)
Primary Care Physician: Donald Prose, MD Primary Electrophysiologist: Dr Caryl Comes Referring Physician: Dr Tora Duck Magdalene River is a 85 y.o. female with a history of paroxysmal atrial fibrillation, sinus node dysfunction, OSA, and HTN who presents for follow up in the [facility].  The patient was initially diagnosed with atrial fibrillation several years ago and has done well on sotalol. On 09/14/18, patient sent a message about having elevated heart rates. Patient sent manual remote transmission to device clinic which did show atrial tachycardia HR in 150s. Mode switch detection is set at 175 bpm so episode was not monitored.  Patient reports that she has felt well since her last visit. There was a 7 hour episode of high atrial rates noted on 09/15/18. She was completely unaware of the arrhythmia. She is a-paced today on interrogation.   Today, she denies symptoms of palpitations, chest pain, shortness of breath, orthopnea, PND, lower extremity edema, dizziness, presyncope, syncope, snoring, daytime somnolence, bleeding, or neurologic sequela. The patient is tolerating medications without difficulties and is otherwise without complaint today.    Atrial Fibrillation Risk Factors:  she does have symptoms or diagnosis of sleep apnea. she is compliant with CPAP therapy. she does not have a history of rheumatic fever. she does have a history of alcohol use. The patient does not have a history of early familial atrial fibrillation or other arrhythmias.  she has a BMI of Body mass index is 29.95 kg/m.Marland Kitchen Filed Weights   09/22/18 1326  Weight: 89.4 kg    Family History  Problem Relation Age of Onset  . Colon cancer Mother        colon  . Cancer Father        unknown type  . Stroke Sister   . Skin cancer Brother   . Heart disease Brother   . Breast cancer Paternal Aunt   . Asthma Brother      Atrial Fibrillation Management history:  Previous  antiarrhythmic drugs: sotalol Previous cardioversions: none Previous ablations: none CHADS2VASC score: 7 (female, age, HTN, CVA, PVD) Anticoagulation history: Eliquis   Past Medical History:  Diagnosis Date  . Ankle fracture 2016  . Atrial fibrillation ([place])   . Depression   . Elevated parathyroid hormone   . Fracture of orbital floor with routine healing   . GERD (gastroesophageal reflux disease)   . History of colon polyps    benign  . Hypercalcemia   . Hypertension   . Hyponatremia   . Hypothyroid   . Macular degeneration    wet in the right and dry in the left  . OSA on CPAP   . Osteoporosis   . Peripheral vascular disease ([place])   . PSVT (paroxysmal supraventricular tachycardia) ([place])   . Rotator cuff tear   . Sinus node dysfunction ([facility])    a. s/p MDT pacemaker  . Stroke ([place])   . Urinary incontinence   . Wrist fracture 2018   Past Surgical History:  Procedure Laterality Date  . BACK SURGERY    . CATARACT EXTRACTION    . CATARACT EXTRACTION W/ INTRAOCULAR LENS  IMPLANT, BILATERAL Bilateral   . COLONOSCOPY    . ESOPHAGOGASTRODUODENOSCOPY    . IR ANGIO INTRA EXTRACRAN SEL COM CAROTID INNOMINATE BILAT MOD SED  12/30/2017  . IR ANGIO VERTEBRAL SEL VERTEBRAL BILAT MOD SED  12/30/2017  . LAPAROSCOPIC CHOLECYSTECTOMY  08/11/1999  . LUMBAR DISC SURGERY  02/2014  . ORIF ANKLE FRACTURE Left 04/13/2015  Procedure: OPEN REDUCTION INTERNAL FIXATION (ORIF) LEFT ANKLE FRACTURE;  Surgeon: Meredith Pel, MD;  Location: [facility];  Service: Orthopedics;  Laterality: Left;  . PACEMAKER PLACEMENT Right 2012   a. MDT dual chamber PPM implanted by Dr Caryl Comes   . SHOULDER ARTHROSCOPY W/ ROTATOR CUFF REPAIR Right 08/30/2014   WITH MINI-OPEN ROTATOR CUFF REPAIR AND SUBACROMIAL DECOMPRESSION  . SHOULDER ARTHROSCOPY WITH ROTATOR CUFF REPAIR AND SUBACROMIAL DECOMPRESSION Right 08/30/2014   Procedure: SHOULDER ARTHROSCOPY WITH MINI-OPEN ROTATOR CUFF REPAIR AND SUBACROMIAL DECOMPRESSION;  Surgeon:  Meredith Pel, MD;  Location: [facility];  Service: Orthopedics;  Laterality: Right;  RIGHT SHOULDER DIAGNOSTIC OPERATIVE ARTHROSCOPY, SUBACROMIAL DECOMPRESSION, MINI-OPEN ROTATOR CUFF REPAIR.  Marland Kitchen VAGINAL HYSTERECTOMY  1970    Current Outpatient Medications  Medication Sig Dispense Refill  . acetaminophen (TYLENOL) 325 MG tablet Take 650 mg by mouth every 6 (six) hours as needed.    Marland Kitchen alendronate (FOSAMAX) 70 MG tablet Take 1 tablet (70 mg total) by mouth every 7 (seven) days. Take with a full glass of water on an empty stomach. 4 tablet 11  . amLODipine (NORVASC) 10 MG tablet Take 1 tablet (10 mg total) by mouth daily. 90 tablet 1  . apixaban (ELIQUIS) 5 MG TABS tablet Take 1 tablet (5 mg total) by mouth 2 (two) times daily. 60 tablet 3  . atorvastatin (LIPITOR) 40 MG tablet Take 1 tablet (40 mg total) by mouth daily at 6 PM. 30 tablet 3  . B Complex-C (B-COMPLEX WITH VITAMIN C) tablet Take 1 tablet by mouth daily.    . Cholecalciferol (VITAMIN D3) 5000 UNITS CAPS Take 1 capsule by mouth daily.     . citalopram (CELEXA) 20 MG tablet TAKE 1 TABLET (20 MG TOTAL) BY MOUTH DAILY. 90 tablet 1  . desmopressin (DDAVP) 0.1 MG tablet Take 0.1 mg by mouth as needed.    Marland Kitchen lisinopril (PRINIVIL,ZESTRIL) 20 MG tablet Take 1 tablet (20 mg total) by mouth daily. 90 tablet 1  . Lutein 20 MG TABS Take 1 tablet by mouth.     . magnesium oxide (MAG-OX) 400 MG tablet Take 1 tablet (400 mg total) by mouth daily. 30 tablet 0  . Multiple Vitamin (MULTIVITAMIN WITH MINERALS) TABS tablet Take 1 tablet by mouth daily.    . Multiple Vitamins-Minerals (PRESERVISION AREDS PO) Take 1 tablet by mouth 2 (two) times daily.    . sotalol (BETAPACE) 80 MG tablet TAKE 1 AND 1/2 TABLETS BY MOUTH TWICE A DAY 270 tablet 2   No current facility-administered medications for this encounter.     Allergies  Allergen Reactions  . Adhesive [Tape] Swelling and Rash  . Benzalkonium Chloride Rash  . Neosporin  [Neomycin-Polymyxin-Gramicidin] Swelling and Rash    Social History   Socioeconomic History  . Marital status: Married    Spouse name: Not on file  . Number of children: 4  . Years of education: 55  . Highest education level: Not on file  Occupational History  . 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 Financial resource strain: Not on file  . Food insecurity:    Worry: Not on file    Inability: Not on file  . Transportation needs:    Medical: Not on file    Non-medical: Not on file  Tobacco Use  . Smoking status: Former Smoker    Packs/day: 1.00    Years: 45.00    Pack years: 45.00    Types: Cigarettes  .  Smokeless tobacco: Never Used  . Tobacco comment: quit smoking in 1992  Substance and Sexual Activity  . Alcohol use: Yes    Alcohol/week: 28.0 standard drinks    Types: 28 Glasses of wine per week    Comment: 08/30/2014 "16oz wine q night"  . Drug use: No  . Sexual activity: Yes    Birth control/protection: Surgical  Lifestyle  . Physical activity:    Days per week: Not on file    Minutes per session: Not on file  . Stress: Not on file  Relationships  . Social connections:    Talks on phone: Not on file    Gets together: Not on file    Attends religious service: Not on file    Active member of club or organization: Not on file    Attends meetings of clubs or organizations: Not on file    Relationship status: Not on file  . Intimate partner violence:    Fear of current or ex partner: Not on file    Emotionally abused: Not on file    Physically abused: Not on file    Forced sexual activity: Not on file  Other Topics Concern  . Not on file  Social History Narrative   Ms. Siple lives with her husband at [employer]. They are retired and recently relocated to [facility] from [place]. She has 4 grown children.    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     ROS- All systems are reviewed and  negative except as per the HPI above.  Physical Exam: Vitals:   09/22/18 1326  BP: (!) 142/70  Pulse: (!) 110  Weight: 89.4 kg  Height: 5\' 8"  (1.727 m)    GEN- The patient is well appearing elderly female, alert and oriented x 3 today.   HEENT-head normocephalic, atraumatic, sclera clear, conjunctiva pink, hearing intact, trachea midline. Lungs- Clear to ausculation bilaterally, normal work of breathing Heart- Regular rate and rhythm, no murmurs, rubs or gallops  GI- soft, NT, ND, + BS Extremities- no clubbing, cyanosis, or edema MS- no significant deformity or atrophy Skin- no rash or lesion Psych- euthymic mood, full affect Neuro- strength and sensation are intact   Wt Readings from Last 3 Encounters:  09/22/18 89.4 kg  09/14/18 88 kg  08/14/18 90.5 kg    EKG not ordered today.  Echo 12/31/17 demonstrated  - Left ventricle: The cavity size was normal. Wall thickness was   normal. Systolic function was normal. The estimated ejection   fraction was in the range of 55% to 60%. Wall motion was normal;   there were no regional wall motion abnormalities. There was a   reduced contribution of atrial contraction to ventricular   filling, due to increased ventricular diastolic pressure or   atrial contractile dysfunction (consider atrial mechanical   stunning after recentt atrial fibrillation). - Aortic valve: There was mild regurgitation. - Left atrium: The atrium was mildly dilated.  Device interrogation-see PACEART report  Epic records are reviewed at length today  Assessment and Plan:  1. Paroxysmal atrial fibrillation/flutter The patient has paroxysmal atrial fibrillation and atrial flutter. She is asymptomatic. She did have a 7-hour episode noted on device interrogation and patient was unaware.  A-paced today. No indication for DCCV or pace termination at this time. Continue Eliquis 5 mg BID Continue Sotalol 120 mg BID Recheck Bmet/CBC/mag  This patients  CHA2DS2-VASc Score and unadjusted Ischemic Stroke Rate (% per year) is  equal to 11.2 % stroke rate/year from a score of 7  Above score calculated as 1 point each if present [CHF, HTN, DM, Vascular=MI/PAD/Aortic Plaque, Age if 65-74, or Female] Above score calculated as 2 points each if present [Age > 75, or Stroke/TIA/TE]  2. Overweight Lifestyle modifications encouraged including regular physical activity and weight loss.   3. Obstructive sleep apnea The importance of treatment of OSA discussed. She is compliant with CPAP.  4. Sinus node dysfunction PPM, plans per device clinic and Dr Caryl Comes.  5. HTN Stable, no changes today.   Follow up with Dr Caryl Comes as scheduled.   [facility] [address] [place], [place] [address] [phone] 09/22/2018 1:57 PM

## 2018-09-25 ENCOUNTER — Other Ambulatory Visit: Payer: Self-pay | Admitting: Internal Medicine

## 2018-10-16 ENCOUNTER — Ambulatory Visit: Payer: Medicare Other | Admitting: Pulmonary Disease

## 2018-10-16 DIAGNOSIS — M47816 Spondylosis without myelopathy or radiculopathy, lumbar region: Secondary | ICD-10-CM | POA: Diagnosis not present

## 2018-10-26 DIAGNOSIS — F411 Generalized anxiety disorder: Secondary | ICD-10-CM | POA: Diagnosis not present

## 2018-10-26 DIAGNOSIS — I4891 Unspecified atrial fibrillation: Secondary | ICD-10-CM | POA: Diagnosis not present

## 2018-10-26 DIAGNOSIS — Z8673 Personal history of transient ischemic attack (TIA), and cerebral infarction without residual deficits: Secondary | ICD-10-CM | POA: Diagnosis not present

## 2018-10-26 DIAGNOSIS — I10 Essential (primary) hypertension: Secondary | ICD-10-CM | POA: Diagnosis not present

## 2018-10-26 DIAGNOSIS — Z0000 Encounter for general adult medical examination without abnormal findings: Secondary | ICD-10-CM | POA: Diagnosis not present

## 2018-10-26 DIAGNOSIS — Z1389 Encounter for screening for other disorder: Secondary | ICD-10-CM | POA: Diagnosis not present

## 2018-10-26 DIAGNOSIS — E785 Hyperlipidemia, unspecified: Secondary | ICD-10-CM | POA: Diagnosis not present

## 2018-10-26 DIAGNOSIS — H3530 Unspecified macular degeneration: Secondary | ICD-10-CM | POA: Diagnosis not present

## 2018-10-26 DIAGNOSIS — M810 Age-related osteoporosis without current pathological fracture: Secondary | ICD-10-CM | POA: Diagnosis not present

## 2018-10-26 DIAGNOSIS — N3281 Overactive bladder: Secondary | ICD-10-CM | POA: Diagnosis not present

## 2018-11-09 ENCOUNTER — Telehealth: Payer: Self-pay | Admitting: Pulmonary Disease

## 2018-11-09 NOTE — Telephone Encounter (Signed)
Returned call to patient. She got a letter from her [facility] stating she was eligible for new cpap machine. Asked patient if she was having any issues with current machine. She says no. Explained insurance will deny claim if she is not having any issues with cpap. She will wait until next f/u with Dr. Halford Chessman. In July. Nothing further needed.

## 2018-11-20 ENCOUNTER — Telehealth: Payer: Self-pay

## 2018-11-20 NOTE — Telephone Encounter (Signed)
I tried to call patient about upcoming appointment with Dr. Caryl Comes on 11/26/18. We need to switch this to a video visit/telephone visit. There was no answer and no voicemail to leave a message.

## 2018-11-24 NOTE — Telephone Encounter (Signed)
I called and spoke with patient, she can switch to a video visit on 11/26/18.     Virtual Visit Pre-Appointment Phone Call  "(Name), I am calling you today to discuss your upcoming appointment. We are currently trying to limit exposure to the virus that causes COVID-19 by seeing patients at home rather than in the office."  1. "What is the BEST phone number to call the day of the visit?" - include this in appointment notes  2. "Do you have or have access to (through a family member/friend) a smartphone with video capability that we can use for your visit?" a. If yes - list this number in appt notes as "cell" (if different from BEST phone #) and list the appointment type as a VIDEO visit in appointment notes b. If no - list the appointment type as a PHONE visit in appointment notes  3. Confirm consent - "In the setting of the current Covid19 crisis, you are scheduled for a (phone or video) visit with your provider on (date) at (time).  Just as we do with many in-office visits, in order for you to participate in this visit, we must obtain consent.  If you'd like, I can send this to your mychart (if signed up) or email for you to review.  Otherwise, I can obtain your verbal consent now.  All virtual visits are billed to your insurance company just like a normal visit would be.  By agreeing to a virtual visit, we'd like you to understand that the technology does not allow for your provider to perform an examination, and thus may limit your provider's ability to fully assess your condition. If your provider identifies any concerns that need to be evaluated in person, we will make arrangements to do so.  Finally, though the technology is pretty good, we cannot assure that it will always work on either your or our end, and in the setting of a video visit, we may have to convert it to a phone-only visit.  In either situation, we cannot ensure that we have a secure connection.  Are you willing to proceed?"  STAFF: Did the patient verbally acknowledge consent to telehealth visit? Document YES/NO here: YES  4. Advise patient to be prepared - "Two hours prior to your appointment, go ahead and check your blood pressure, pulse, oxygen saturation, and your weight (if you have the equipment to check those) and write them all down. When your visit starts, your provider will ask you for this information. If you have an Apple Watch or Kardia device, please plan to have heart rate information ready on the day of your appointment. Please have a pen and paper handy nearby the day of the visit as well."  5. Give patient instructions for MyChart download to smartphone OR Doximity/Doxy.me as below if video visit (depending on what platform provider is using)  6. Inform patient they will receive a phone call 15 minutes prior to their appointment time (may be from unknown caller ID) so they should be prepared to answer    TELEPHONE CALL NOTE  Brittany [name] has been deemed a candidate for a follow-up tele-health visit to limit community exposure during the Covid-19 pandemic. I spoke with the patient via phone to ensure availability of phone/video source, confirm preferred email & phone number, and discuss instructions and expectations.  I reminded Brittany [name] to be prepared with any vital sign and/or heart rhythm information that could potentially be obtained via home  monitoring, at the time of her visit. I reminded Brittany [name] to expect a phone call prior to her visit.  Mady Haagensen, [place] 11/24/2018 11:59 AM   INSTRUCTIONS FOR DOWNLOADING THE MYCHART APP TO SMARTPHONE  - The patient must first make sure to have activated MyChart and know their login information - If Apple, go to [employer] and type in MyChart in the search bar and download the app. If Android, ask patient to go to [employer] and type in [place] in the search bar and download the app. The app is free but as with any other app  downloads, their phone may require them to verify saved payment information or Apple/Android password.  - The patient will need to then log into the app with their MyChart username and password, and select [facility] as their healthcare provider to link the account. When it is time for your visit, go to the MyChart app, find appointments, and click Begin Video Visit. Be sure to Select Allow for your device to access the Microphone and Camera for your visit. You will then be connected, and your provider will be with you shortly.  **If they have any issues connecting, or need assistance please contact MyChart service desk [phone])**  **If using a computer, in order to ensure the best quality for their visit they will need to use either of the following Internet Browsers: [employer], or Google Chrome**  IF USING DOXIMITY or DOXY.ME - The patient will receive a link just prior to their visit by text.     FULL LENGTH CONSENT FOR TELE-HEALTH VISIT   I hereby voluntarily request, consent and authorize [facility] and its employed or contracted physicians, physician assistants, nurse practitioners or other licensed health care professionals (the Practitioner), to provide me with telemedicine health care services (the "Services") as deemed necessary by the treating Practitioner. I acknowledge and consent to receive the Services by the Practitioner via telemedicine. I understand that the telemedicine visit will involve communicating with the Practitioner through live audiovisual communication technology and the disclosure of certain medical information by electronic transmission. I acknowledge that I have been given the opportunity to request an in-person assessment or other available alternative prior to the telemedicine visit and am voluntarily participating in the telemedicine visit.  I understand that I have the right to withhold or withdraw my consent to the use of telemedicine  in the course of my care at any time, without affecting my right to future care or treatment, and that the Practitioner or I may terminate the telemedicine visit at any time. I understand that I have the right to inspect all information obtained and/or recorded in the course of the telemedicine visit and may receive copies of available information for a reasonable fee.  I understand that some of the potential risks of receiving the Services via telemedicine include:  Marland Kitchen Delay or interruption in medical evaluation due to technological equipment failure or disruption; . Information transmitted may not be sufficient (e.g. poor resolution of images) to allow for appropriate medical decision making by the Practitioner; and/or  . In rare instances, security protocols could fail, causing a breach of personal health information.  Furthermore, I acknowledge that it is my responsibility to provide information about my medical history, conditions and care that is complete and accurate to the best of my ability. I acknowledge that Practitioner's advice, recommendations, and/or decision may be based on factors not within their control, such as  incomplete or inaccurate data provided by me or distortions of diagnostic images or specimens that may result from electronic transmissions. I understand that the practice of medicine is not an exact science and that Practitioner makes no warranties or guarantees regarding treatment outcomes. I acknowledge that I will receive a copy of this consent concurrently upon execution via email to the email address I last provided but may also request a printed copy by calling the office of [facility].    I understand that my insurance will be billed for this visit.   I have read or had this consent read to me. . I understand the contents of this consent, which adequately explains the benefits and risks of the Services being provided via telemedicine.  . I have been provided ample  opportunity to ask questions regarding this consent and the Services and have had my questions answered to my satisfaction. . I give my informed consent for the services to be provided through the use of telemedicine in my medical care  By participating in this telemedicine visit I agree to the above.

## 2018-11-26 ENCOUNTER — Telehealth (INDEPENDENT_AMBULATORY_CARE_PROVIDER_SITE_OTHER): Payer: Medicare Other | Admitting: Internal Medicine

## 2018-11-26 ENCOUNTER — Other Ambulatory Visit: Payer: Self-pay

## 2018-11-26 ENCOUNTER — Encounter: Payer: Self-pay | Admitting: Internal Medicine

## 2018-11-26 VITALS — BP 129/66 | HR 74 | Ht 68.0 in | Wt 188.0 lb

## 2018-11-26 DIAGNOSIS — Z79899 Other long term (current) drug therapy: Secondary | ICD-10-CM

## 2018-11-26 DIAGNOSIS — I480 Paroxysmal atrial fibrillation: Secondary | ICD-10-CM | POA: Diagnosis not present

## 2018-11-26 DIAGNOSIS — Z950 Presence of cardiac pacemaker: Secondary | ICD-10-CM

## 2018-11-26 DIAGNOSIS — I495 Sick sinus syndrome: Secondary | ICD-10-CM

## 2018-11-26 NOTE — Progress Notes (Signed)
Electrophysiology TeleHealth Note   Due to national recommendations of social distancing due to COVID 19, an audio/video telehealth visit is felt to be most appropriate for this patient at this time.  See MyChart message from today for the patient's consent to telehealth for [facility].   Date:  11/26/2018   ID:  Brittany [name], DOB Jan 04, 1934, MRN [id number]  Location: patient's home  Provider location: [address] [place]  Evaluation Performed: Follow-up visit  PCP:  Donald Prose, MD  Cardiologist:     Electrophysiologist:  SK   Chief Complaint:  Tachybrady syndrome   History of Present Illness:    Brittany [name] is a 85 y.o. female who presents via audio/video conferencing for a telehealth visit today.  Since last being seen in our clinic, the patient reports interval stroke,  Now on apixoban   6/19 [facility] >>LV fn normal, Mild LAE AI   Tolerating well with less bruising and aspirin.  No bleeding issues.  No interval palpitations.  Stable functional capacity with mild dyspnea no chest pain edema or nocturnal dyspnea.  The patient denies symptoms of fevers, chills, cough, or new SOB worrisome for COVID 19.   Past Medical History:  Diagnosis Date  . Ankle fracture 2016  . Atrial fibrillation ([place])   . Depression   . Elevated parathyroid hormone   . Fracture of orbital floor with routine healing   . GERD (gastroesophageal reflux disease)   . History of colon polyps    benign  . Hypercalcemia   . Hypertension   . Hyponatremia   . Hypothyroid   . Macular degeneration    wet in the right and dry in the left  . OSA on CPAP   . Osteoporosis   . Peripheral vascular disease ([place])   . PSVT (paroxysmal supraventricular tachycardia) ([place])   . Rotator cuff tear   . Sinus node dysfunction ([facility])    a. s/p MDT pacemaker  . Stroke ([place])   . Urinary incontinence   . Wrist fracture 2018    Past Surgical History:  Procedure Laterality Date  .  BACK SURGERY    . CATARACT EXTRACTION    . CATARACT EXTRACTION W/ INTRAOCULAR LENS  IMPLANT, BILATERAL Bilateral   . COLONOSCOPY    . ESOPHAGOGASTRODUODENOSCOPY    . IR ANGIO INTRA EXTRACRAN SEL COM CAROTID INNOMINATE BILAT MOD SED  12/30/2017  . IR ANGIO VERTEBRAL SEL VERTEBRAL BILAT MOD SED  12/30/2017  . LAPAROSCOPIC CHOLECYSTECTOMY  08/11/1999  . LUMBAR DISC SURGERY  02/2014  . ORIF ANKLE FRACTURE Left 04/13/2015   Procedure: OPEN REDUCTION INTERNAL FIXATION (ORIF) LEFT ANKLE FRACTURE;  Surgeon: Meredith Pel, MD;  Location: [facility];  Service: Orthopedics;  Laterality: Left;  . PACEMAKER PLACEMENT Right 2012   a. MDT dual chamber PPM implanted by Dr Caryl Comes   . SHOULDER ARTHROSCOPY W/ ROTATOR CUFF REPAIR Right 08/30/2014   WITH MINI-OPEN ROTATOR CUFF REPAIR AND SUBACROMIAL DECOMPRESSION  . SHOULDER ARTHROSCOPY WITH ROTATOR CUFF REPAIR AND SUBACROMIAL DECOMPRESSION Right 08/30/2014   Procedure: SHOULDER ARTHROSCOPY WITH MINI-OPEN ROTATOR CUFF REPAIR AND SUBACROMIAL DECOMPRESSION;  Surgeon: Meredith Pel, MD;  Location: [facility];  Service: Orthopedics;  Laterality: Right;  RIGHT SHOULDER DIAGNOSTIC OPERATIVE ARTHROSCOPY, SUBACROMIAL DECOMPRESSION, MINI-OPEN ROTATOR CUFF REPAIR.  Marland Kitchen VAGINAL HYSTERECTOMY  1970    Current Outpatient Medications  Medication Sig Dispense Refill  . acetaminophen (TYLENOL) 325 MG tablet Take 650 mg by mouth every 6 (six) hours  as needed.    Marland Kitchen alendronate (FOSAMAX) 70 MG tablet Take 1 tablet (70 mg total) by mouth every 7 (seven) days. Take with a full glass of water on an empty stomach. 4 tablet 11  . amLODipine (NORVASC) 10 MG tablet Take 1 tablet (10 mg total) by mouth daily. 90 tablet 1  . apixaban (ELIQUIS) 5 MG TABS tablet Take 1 tablet (5 mg total) by mouth 2 (two) times daily. 60 tablet 3  . atorvastatin (LIPITOR) 40 MG tablet Take 1 tablet (40 mg total) by mouth daily at 6 PM. 30 tablet 3  . B Complex-C (B-COMPLEX WITH VITAMIN C) tablet Take 1 tablet by mouth  daily.    . citalopram (CELEXA) 20 MG tablet TAKE 1 TABLET (20 MG TOTAL) BY MOUTH DAILY. 90 tablet 1  . desmopressin (DDAVP) 0.1 MG tablet Take 0.1 mg by mouth as needed.    Marland Kitchen lisinopril (PRINIVIL,ZESTRIL) 20 MG tablet Take 1 tablet (20 mg total) by mouth daily. 90 tablet 1  . Lutein 20 MG TABS Take 1 tablet by mouth.     . magnesium oxide (MAG-OX) 400 MG tablet Take 1 tablet (400 mg total) by mouth daily. 30 tablet 0  . Multiple Vitamin (MULTIVITAMIN WITH MINERALS) TABS tablet Take 1 tablet by mouth daily.    . Multiple Vitamins-Minerals (PRESERVISION AREDS PO) Take 1 tablet by mouth 2 (two) times daily.    . sotalol (BETAPACE) 80 MG tablet TAKE 1 AND 1/2 TABLETS BY MOUTH TWICE A DAY 270 tablet 2  . Cholecalciferol (VITAMIN D3) 5000 UNITS CAPS Take 1 capsule by mouth daily.      No current facility-administered medications for this visit.     Allergies:   Adhesive [tape]; Benzalkonium chloride; and Neosporin [neomycin-polymyxin-gramicidin]   Social History:  The patient  reports that she has quit smoking. Her smoking use included cigarettes. She has a 45.00 pack-year smoking history. She has never used smokeless tobacco. She reports current alcohol use of about 28.0 standard drinks of alcohol per week. She reports that she does not use drugs.   Family History:  The patient's   family history includes Asthma in her brother; Breast cancer in her paternal aunt; Cancer in her father; Colon cancer in her mother; Heart disease in her brother; Skin cancer in her brother; Stroke in her sister.   ROS:  Please see the history of present illness.   All other systems are personally reviewed and negative.    Exam:    Vital Signs:  BP 129/66   Pulse 74   Ht 5\' 8"  (1.727 m)   Wt 188 lb (85.3 kg)   LMP 07/22/1968   BMI 28.59 kg/m     Well appearing, alert and conversant, regular work of breathing,  good skin color Eyes- anicteric, neuro- grossly intact, skin- no apparent rash or lesions or  cyanosis, mouth- oral mucosa is pink   Labs/Other Tests and Data Reviewed:    Recent Labs: 12/30/2017: ALT 22 09/22/2018: BUN 9; Creatinine, Ser 0.61; Hemoglobin 12.9; Magnesium 2.1; Platelets 255; Potassium 4.4; Sodium 137  Personally reviewed    Wt Readings from Last 3 Encounters:  11/26/18 188 lb (85.3 kg)  09/22/18 197 lb (89.4 kg)  09/14/18 194 lb (88 kg)     Other studies personally reviewed: Additional studies/ records that were reviewed today include: hospital records   Review of the above records today demonstrates:  As above  Prior radiographs:      Last device remote is  reviewed from [facility] PDF dated 3/20 which reveals normal device function,   arrhythmias -significantly increased burden in atrial fibrillation with a moderately rapid ventricular response (5-10% of beats between 120-140)   ASSESSMENT & PLAN:    Atrial fibrillation-paroxysmal    Sinus bradycardia   High Risk Medication Surveillance  Pacemaker-Medtronic    Hypertension   Significant interval burden of atrial fibrillation now 55%  Rapid rate as noted above.  Anticipate review of remote monitoring report due next week to reassess atrial fibrillation burden and rate.  If burden remains elevated, will discontinue sotalol as she has no attributable symptoms.  Would need augmented rate control.    Continue Eliquis.  No significant bleeding clinically; stable   hemoglobin  COVID 19 screen The patient denies symptoms of COVID 19 at this time.  The importance of social distancing was discussed today.  Follow-up: 2-3 w Next remote: As Scheduled   Current medicines are reviewed at length with the patient today.   The patient does not have concerns regarding her medicines.  The following changes were made today:  none  Labs/ tests ordered today include:   No orders of the defined types were placed in this encounter.   Future tests ( post COVID )     Patient Risk:  after full review of  this patients clinical status, I feel that they are at moderate risk at this time.  Today, I have spent 15 minutes with the patient with telehealth technology discussing the above.  Signed, Virl Axe, MD  11/26/2018 2:47 PM     [place] [address] [place] [address] [phone] (office) [phone] (fax)

## 2018-12-02 ENCOUNTER — Other Ambulatory Visit: Payer: Self-pay

## 2018-12-02 ENCOUNTER — Ambulatory Visit (INDEPENDENT_AMBULATORY_CARE_PROVIDER_SITE_OTHER): Payer: Medicare Other | Admitting: *Deleted

## 2018-12-02 DIAGNOSIS — I495 Sick sinus syndrome: Secondary | ICD-10-CM

## 2018-12-02 DIAGNOSIS — I480 Paroxysmal atrial fibrillation: Secondary | ICD-10-CM | POA: Diagnosis not present

## 2018-12-02 LAB — CUP PACEART REMOTE DEVICE CHECK
Battery Impedance: 1983 Ohm
Battery Remaining Longevity: 31 mo
Battery Voltage: 2.75 V
Brady Statistic AP VP Percent: 3 %
Brady Statistic AP VS Percent: 67 %
Brady Statistic AS VP Percent: 7 %
Brady Statistic AS VS Percent: 23 %
Date Time Interrogation Session: 20200513113117
Implantable Lead Implant Date: 20121213
Implantable Lead Implant Date: 20121213
Implantable Lead Location: 753859
Implantable Lead Location: 753860
Implantable Lead Model: 4076
Implantable Lead Model: 4076
Implantable Lead Serial Number: 835025
Implantable Lead Serial Number: 869269
Implantable Pulse Generator Implant Date: 20121213
Lead Channel Impedance Value: 372 Ohm
Lead Channel Impedance Value: 496 Ohm
Lead Channel Pacing Threshold Amplitude: 0.375 V
Lead Channel Pacing Threshold Amplitude: 0.625 V
Lead Channel Pacing Threshold Pulse Width: 0.4 ms
Lead Channel Pacing Threshold Pulse Width: 0.4 ms
Lead Channel Setting Pacing Amplitude: 2 V
Lead Channel Setting Pacing Amplitude: 2.5 V
Lead Channel Setting Pacing Pulse Width: 0.4 ms
Lead Channel Setting Sensing Sensitivity: 4 mV

## 2018-12-11 ENCOUNTER — Other Ambulatory Visit: Payer: Self-pay

## 2018-12-11 ENCOUNTER — Telehealth (INDEPENDENT_AMBULATORY_CARE_PROVIDER_SITE_OTHER): Payer: Medicare Other | Admitting: Internal Medicine

## 2018-12-11 ENCOUNTER — Encounter: Payer: Self-pay | Admitting: Internal Medicine

## 2018-12-11 VITALS — BP 144/69 | HR 60 | Ht 68.0 in | Wt 188.0 lb

## 2018-12-11 DIAGNOSIS — I480 Paroxysmal atrial fibrillation: Secondary | ICD-10-CM

## 2018-12-11 DIAGNOSIS — I495 Sick sinus syndrome: Secondary | ICD-10-CM

## 2018-12-11 DIAGNOSIS — Z79899 Other long term (current) drug therapy: Secondary | ICD-10-CM

## 2018-12-11 DIAGNOSIS — Z950 Presence of cardiac pacemaker: Secondary | ICD-10-CM

## 2018-12-11 MED ORDER — METOPROLOL SUCCINATE ER 50 MG PO TB24: 50.0000 mg | ORAL_TABLET | Freq: Every day | ORAL | 3 refills | Status: DC

## 2018-12-11 NOTE — Patient Instructions (Addendum)
   Called and discussed the following recommendations with pt. She has verbalized understanding and has no questions. She will call the clinic back if she notices an increase in her palpitations with recent medication changes.    Medication Instructions:  Your physician has recommended you make the following change in your medication:   Stop Sotolol Begin Metoprolol Succinate, 50mg , one tablet daily.  Labwork: None ordered.   Testing/Procedures: None ordered.   Follow-Up: Your physician recommends that you schedule a follow-up appointment in: 3 months with Dr Caryl Comes  Any Other Special Instructions Will Be Listed Below (If Applicable).     If you need a refill on your cardiac medications before your next appointment, please call your pharmacy.

## 2018-12-11 NOTE — Progress Notes (Signed)
Electrophysiology TeleHealth Note   Due to national recommendations of social distancing due to COVID 19, an audio/video telehealth visit is felt to be most appropriate for this patient at this time.  See MyChart message from today for the patient's consent to telehealth for [facility].   Date:  12/11/2018   ID:  Brittany [name], DOB 1933/08/03, MRN [id number]  Location: patient's home  Provider location: [address], [place]  Evaluation Performed: Follow-up visit  PCP:  Donald Prose, MD  Cardiologist:     Electrophysiologist:  SK   Chief Complaint:     History of Present Illness:    Brittany [name] is a 85 y.o. female who presents via audio/video conferencing for a telehealth visit today.  Since last being seen in our virtual clinic, the patient reports no changes  The patient denies chest pain, shortness of breath, nocturnal dyspnea, orthopnea or peripheral edema.  There have been no palpitations, lightheadedness or syncope.     The patient denies symptoms of fevers, chills, cough, or new SOB worrisome for COVID 19.    Past Medical History:  Diagnosis Date  . Ankle fracture 2016  . Atrial fibrillation ([place])   . Depression   . Elevated parathyroid hormone   . Fracture of orbital floor with routine healing   . GERD (gastroesophageal reflux disease)   . History of colon polyps    benign  . Hypercalcemia   . Hypertension   . Hyponatremia   . Hypothyroid   . Macular degeneration    wet in the right and dry in the left  . OSA on CPAP   . Osteoporosis   . Peripheral vascular disease ([place])   . PSVT (paroxysmal supraventricular tachycardia) ([place])   . Rotator cuff tear   . Sinus node dysfunction ([facility])    a. s/p MDT pacemaker  . Stroke ([place])   . Urinary incontinence   . Wrist fracture 2018    Past Surgical History:  Procedure Laterality Date  . BACK SURGERY    . CATARACT EXTRACTION    . CATARACT EXTRACTION W/ INTRAOCULAR LENS  IMPLANT,  BILATERAL Bilateral   . COLONOSCOPY    . ESOPHAGOGASTRODUODENOSCOPY    . IR ANGIO INTRA EXTRACRAN SEL COM CAROTID INNOMINATE BILAT MOD SED  12/30/2017  . IR ANGIO VERTEBRAL SEL VERTEBRAL BILAT MOD SED  12/30/2017  . LAPAROSCOPIC CHOLECYSTECTOMY  08/11/1999  . LUMBAR DISC SURGERY  02/2014  . ORIF ANKLE FRACTURE Left 04/13/2015   Procedure: OPEN REDUCTION INTERNAL FIXATION (ORIF) LEFT ANKLE FRACTURE;  Surgeon: Meredith Pel, MD;  Location: [place];  Service: Orthopedics;  Laterality: Left;  . PACEMAKER PLACEMENT Right 2012   a. MDT dual chamber PPM implanted by Dr Caryl Comes   . SHOULDER ARTHROSCOPY W/ ROTATOR CUFF REPAIR Right 08/30/2014   WITH MINI-OPEN ROTATOR CUFF REPAIR AND SUBACROMIAL DECOMPRESSION  . SHOULDER ARTHROSCOPY WITH ROTATOR CUFF REPAIR AND SUBACROMIAL DECOMPRESSION Right 08/30/2014   Procedure: SHOULDER ARTHROSCOPY WITH MINI-OPEN ROTATOR CUFF REPAIR AND SUBACROMIAL DECOMPRESSION;  Surgeon: Meredith Pel, MD;  Location: [place];  Service: Orthopedics;  Laterality: Right;  RIGHT SHOULDER DIAGNOSTIC OPERATIVE ARTHROSCOPY, SUBACROMIAL DECOMPRESSION, MINI-OPEN ROTATOR CUFF REPAIR.  Marland Kitchen VAGINAL HYSTERECTOMY  1970    Current Outpatient Medications  Medication Sig Dispense Refill  . acetaminophen (TYLENOL) 325 MG tablet Take 650 mg by mouth every 6 (six) hours as needed.    Marland Kitchen alendronate (FOSAMAX) 70 MG tablet Take 1 tablet (70 mg total)  by mouth every 7 (seven) days. Take with a full glass of water on an empty stomach. 4 tablet 11  . amLODipine (NORVASC) 10 MG tablet Take 1 tablet (10 mg total) by mouth daily. 90 tablet 1  . apixaban (ELIQUIS) 5 MG TABS tablet Take 1 tablet (5 mg total) by mouth 2 (two) times daily. 60 tablet 3  . atorvastatin (LIPITOR) 40 MG tablet Take 1 tablet (40 mg total) by mouth daily at 6 PM. 30 tablet 3  . B Complex-C (B-COMPLEX WITH VITAMIN C) tablet Take 1 tablet by mouth daily.    . Cholecalciferol (VITAMIN D3) 5000 UNITS CAPS Take 1 capsule by mouth daily.     .  citalopram (CELEXA) 20 MG tablet TAKE 1 TABLET (20 MG TOTAL) BY MOUTH DAILY. 90 tablet 1  . desmopressin (DDAVP) 0.1 MG tablet Take 0.1 mg by mouth as needed.    Marland Kitchen lisinopril (PRINIVIL,ZESTRIL) 20 MG tablet Take 1 tablet (20 mg total) by mouth daily. 90 tablet 1  . Lutein 20 MG TABS Take 1 tablet by mouth.     . magnesium oxide (MAG-OX) 400 MG tablet Take 1 tablet (400 mg total) by mouth daily. 30 tablet 0  . Multiple Vitamin (MULTIVITAMIN WITH MINERALS) TABS tablet Take 1 tablet by mouth daily.    . Multiple Vitamins-Minerals (PRESERVISION AREDS PO) Take 1 tablet by mouth 2 (two) times daily.    . sotalol (BETAPACE) 80 MG tablet TAKE 1 AND 1/2 TABLETS BY MOUTH TWICE A DAY 270 tablet 2   No current facility-administered medications for this visit.     Allergies:   Adhesive [tape]; Benzalkonium chloride; and Neosporin [neomycin-polymyxin-gramicidin]   Social History:  The patient  reports that she has quit smoking. Her smoking use included cigarettes. She has a 45.00 pack-year smoking history. She has never used smokeless tobacco. She reports current alcohol use of about 28.0 standard drinks of alcohol per week. She reports that she does not use drugs.   Family History:  The patient's   family history includes Asthma in her brother; Breast cancer in her paternal aunt; Cancer in her father; Colon cancer in her mother; Heart disease in her brother; Skin cancer in her brother; Stroke in her sister.   ROS:  Please see the history of present illness.   All other systems are personally reviewed and negative.    Exam:    Vital Signs:  BP (!) 144/69   Pulse 60   Ht 5\' 8"  (1.727 m)   Wt 188 lb (85.3 kg)   LMP 07/22/1968   BMI 28.59 kg/m     Well appearing, alert and conversant, regular work of breathing,  good skin color Eyes- anicteric, neuro- grossly intact, skin- no apparent rash or lesions or cyanosis, mouth- oral mucosa is pink   Labs/Other Tests and Data Reviewed:    Recent Labs:  12/30/2017: ALT 22 09/22/2018: BUN 9; Creatinine, Ser 0.61; Hemoglobin 12.9; Magnesium 2.1; Platelets 255; Potassium 4.4; Sodium 137   Wt Readings from Last 3 Encounters:  12/11/18 188 lb (85.3 kg)  11/26/18 188 lb (85.3 kg)  09/22/18 197 lb (89.4 kg)     Other studies personally reviewed: Additional studies/ records that were reviewed today include:    *  Last device remote is reviewed from [facility] PDF dated **5/20* which reveals normal device function,   arrhythmias -atrial flutter.  2: 1 conduction**   ASSESSMENT & PLAN:    Atrial fibrillation/flutter-paroxysmal   Sinus bradycardia  High Risk  Medication Surveillance  Pacemaker-Medtronic   Hypertension  Given the absence of her symptoms with her atrial fibrillation, I have discussed the risks and benefits of sotalol and we will plan to stop the antiarrhythmic with its potential for proarrhythmia.  She will need augmented rate control.  We will begin metoprolol 50 mg; this may be insufficient as we are discontinuing 120 twice daily of sotalol.  We will follow her pacemaker interrogations; I have also reviewed with her that she might feel more atrial fibrillation because of rapid rates.  She will let us know.  Also help with blood pressure.  Reviewed drug side effects     COVID 19 screen The patient denies symptoms of COVID 19 at this time.  The importance of social distancing was discussed today.  Follow-up:  77mo Next remote: As Scheduled   Current medicines are reviewed at length with the patient today.   The patient does not have concerns regarding her medicines.  The following changes were made today:   Stop sotalol Begin metoprolol succinate 50 mg daily  Labs/ tests ordered today include:   No orders of the defined types were placed in this encounter.     Patient Risk:  after full review of this patients clinical status, I feel that they are at moderate risk at this time.  Today, I have spent11  minutes with the patient with telehealth technology discussing the above.  Signed, Virl Axe, MD  12/11/2018 9:27 AM     [place] [address] [phone] (office) [phone] (fax)

## 2018-12-16 DIAGNOSIS — I4891 Unspecified atrial fibrillation: Secondary | ICD-10-CM | POA: Diagnosis not present

## 2018-12-16 DIAGNOSIS — I10 Essential (primary) hypertension: Secondary | ICD-10-CM | POA: Diagnosis not present

## 2018-12-16 DIAGNOSIS — E785 Hyperlipidemia, unspecified: Secondary | ICD-10-CM | POA: Diagnosis not present

## 2018-12-16 DIAGNOSIS — M810 Age-related osteoporosis without current pathological fracture: Secondary | ICD-10-CM | POA: Diagnosis not present

## 2018-12-21 ENCOUNTER — Telehealth: Payer: Medicare Other | Admitting: Internal Medicine

## 2018-12-21 NOTE — Progress Notes (Signed)
Remote pacemaker transmission.   

## 2018-12-23 DIAGNOSIS — M47816 Spondylosis without myelopathy or radiculopathy, lumbar region: Secondary | ICD-10-CM | POA: Diagnosis not present

## 2018-12-31 ENCOUNTER — Telehealth: Payer: Self-pay | Admitting: Internal Medicine

## 2018-12-31 NOTE — Telephone Encounter (Signed)
Noted  

## 2018-12-31 NOTE — Telephone Encounter (Signed)
Pt calling today to let us know she had an elevated HR the other day when having a procedure done. She states it was 110bpm. She states she believes her HR has been elevated since switching from sotolol to metoprolol succinate. She does not have a pulse ox and does not know exactly what her HR runs. She states she feels good and is having no s/s of elevated HR at this time.  She states she can go downstairs at her facility and the nurse can check it daily if she pleases. I encouraged pt to purchase a pulse oximeter to keep a record of her HR. If her HR is sustained > 100bpm, she may take an additional 1/2 tablet of Toprol XL.  She will monitor her HR for the next few days and call me if she notices a sustained increase in HR.

## 2018-12-31 NOTE — Telephone Encounter (Signed)
  Pt c/o medication issue:  1. Name of Medication: metoprolol succinate (TOPROL-XL) 50 MG 24 hr tablet  2. How are you currently taking this medication (dosage and times per day)? As directed  3. Are you having a reaction (difficulty breathing--STAT)? No  4. What is your medication issue? Brittany [name] Plan states that she was recently put on metoprolol about two weeks ago. Patient states that her heart is beating really fast and she wants to speak to the nurse.

## 2019-01-07 DIAGNOSIS — H40013 Open angle with borderline findings, low risk, bilateral: Secondary | ICD-10-CM | POA: Diagnosis not present

## 2019-01-07 DIAGNOSIS — H5211 Myopia, right eye: Secondary | ICD-10-CM | POA: Diagnosis not present

## 2019-01-07 DIAGNOSIS — H35363 Drusen (degenerative) of macula, bilateral: Secondary | ICD-10-CM | POA: Diagnosis not present

## 2019-01-07 DIAGNOSIS — H524 Presbyopia: Secondary | ICD-10-CM | POA: Diagnosis not present

## 2019-01-07 DIAGNOSIS — Z961 Presence of intraocular lens: Secondary | ICD-10-CM | POA: Diagnosis not present

## 2019-01-07 DIAGNOSIS — H353133 Nonexudative age-related macular degeneration, bilateral, advanced atrophic without subfoveal involvement: Secondary | ICD-10-CM | POA: Diagnosis not present

## 2019-01-07 DIAGNOSIS — H52222 Regular astigmatism, left eye: Secondary | ICD-10-CM | POA: Diagnosis not present

## 2019-01-07 DIAGNOSIS — H04123 Dry eye syndrome of bilateral lacrimal glands: Secondary | ICD-10-CM | POA: Diagnosis not present

## 2019-01-14 ENCOUNTER — Telehealth: Payer: Self-pay | Admitting: Internal Medicine

## 2019-01-14 MED ORDER — METOPROLOL SUCCINATE ER 50 MG PO TB24: 50.0000 mg | ORAL_TABLET | Freq: Two times a day (BID) | ORAL | 3 refills | Status: DC

## 2019-01-14 NOTE — Telephone Encounter (Signed)
Pt calling today to let us know she has been experiencing more frequent episodes of AF. Dr Caryl Comes recently stopped Sotolol and began Toprol XL. He note states he would like to monitor remote checks and titrate Toprol as indicated.   Pt will send in a remote check this morning for review. At that point, I may send pt to AF clinic for evaluation since Dr. Caryl Comes is out of the office for the next couple of weeks.  Pt has verbalized understanding and has no additional questions.

## 2019-01-14 NOTE — Telephone Encounter (Signed)
Increased AT/AF burden noted since beginning of June. See trends below.

## 2019-01-14 NOTE — Telephone Encounter (Signed)
Spoke with pt and she agrees to see Brittany [name] on Monday @ 3pm.   Pt states her HR continues to stay high, sometimes reaching 150bpm range. She states she has been taking 75mg  Metoprolol Succinate qd for the last few days and it has not been helping. Per Brittany [name], pt may take 50mg  Metoprolol Succinate two times per day until she is seen on Monday in the clinic.   I advised pt to monitor her BP while increasing dose. Currently she is running SBP 132. Pt understands if she has a sustained HR > 140bpm sustained and is symptomatic, she should call EMS or go to the ED for evaluation.   She has agreed and verbalized understanding of the above instructions. She will call with any additional questions.

## 2019-01-14 NOTE — Addendum Note (Signed)
Addended by: Dollene Primrose on: 01/14/2019 04:45 PM   Modules accepted: Orders

## 2019-01-14 NOTE — Telephone Encounter (Signed)
New message;:    Patient concerning a medication Metoprolol 50 Mg in May and patient states she is still having irregular heart beat. Patient would like for some one to call concerning this matter.

## 2019-01-14 NOTE — Telephone Encounter (Signed)
Transmission received 01-14-2019

## 2019-01-18 ENCOUNTER — Encounter (HOSPITAL_COMMUNITY): Payer: Self-pay | Admitting: Nurse Practitioner

## 2019-01-18 ENCOUNTER — Other Ambulatory Visit: Payer: Self-pay

## 2019-01-18 ENCOUNTER — Ambulatory Visit (HOSPITAL_COMMUNITY)
Admission: RE | Admit: 2019-01-18 | Discharge: 2019-01-18 | Disposition: A | Discharge status: Home / Self Care | Payer: Medicare Other | Source: Ambulatory Visit | Attending: Nurse Practitioner | Admitting: Nurse Practitioner

## 2019-01-18 VITALS — BP 118/74 | HR 94 | Ht 68.0 in | Wt 202.0 lb

## 2019-01-18 DIAGNOSIS — I4819 Other persistent atrial fibrillation: Secondary | ICD-10-CM | POA: Diagnosis not present

## 2019-01-18 DIAGNOSIS — I480 Paroxysmal atrial fibrillation: Secondary | ICD-10-CM

## 2019-01-18 DIAGNOSIS — G4733 Obstructive sleep apnea (adult) (pediatric): Secondary | ICD-10-CM | POA: Insufficient documentation

## 2019-01-18 DIAGNOSIS — F329 Major depressive disorder, single episode, unspecified: Secondary | ICD-10-CM | POA: Diagnosis not present

## 2019-01-18 DIAGNOSIS — I10 Essential (primary) hypertension: Secondary | ICD-10-CM | POA: Diagnosis not present

## 2019-01-18 DIAGNOSIS — I739 Peripheral vascular disease, unspecified: Secondary | ICD-10-CM | POA: Insufficient documentation

## 2019-01-18 DIAGNOSIS — M810 Age-related osteoporosis without current pathological fracture: Secondary | ICD-10-CM | POA: Insufficient documentation

## 2019-01-18 DIAGNOSIS — Z79899 Other long term (current) drug therapy: Secondary | ICD-10-CM | POA: Diagnosis not present

## 2019-01-18 DIAGNOSIS — I471 Supraventricular tachycardia: Secondary | ICD-10-CM | POA: Diagnosis not present

## 2019-01-18 DIAGNOSIS — Z87891 Personal history of nicotine dependence: Secondary | ICD-10-CM | POA: Insufficient documentation

## 2019-01-18 DIAGNOSIS — E871 Hypo-osmolality and hyponatremia: Secondary | ICD-10-CM | POA: Insufficient documentation

## 2019-01-18 DIAGNOSIS — Z8673 Personal history of transient ischemic attack (TIA), and cerebral infarction without residual deficits: Secondary | ICD-10-CM | POA: Insufficient documentation

## 2019-01-18 DIAGNOSIS — E039 Hypothyroidism, unspecified: Secondary | ICD-10-CM | POA: Insufficient documentation

## 2019-01-18 DIAGNOSIS — Z7901 Long term (current) use of anticoagulants: Secondary | ICD-10-CM | POA: Insufficient documentation

## 2019-01-18 NOTE — Progress Notes (Addendum)
Primary Care Physician: Donald Prose, MD Referring Physician:Dr. Tora Duck Magdalene River is a 85 y.o. female with a h/o afib, PPM, 2/2 sinus node dysfunction,  HTN, that is in the afib clinic for return of afib after seeing Dr. Caryl Comes and deciding to stop sotalol, per his note, since she had been maintianig SR and possible pro arrhythmia effect of the drug. . Per pt, she still felt she had a lot of afib on sotalol. Her last [facility] report showed 16% afib burden. She found out she was in afib as her husband noted an irregular pulse. She has noted mild increase in exertional dyspnea but no change in energy and she does not feel the afib. She does not have a drug supplement plan so has to pay for all her drugs out of pocket. Her BB dose was increased and she is rate controlled today.  Today, she denies symptoms of palpitations, chest pain, shortness of breath, orthopnea, PND, lower extremity edema, dizziness, presyncope, syncope, or neurologic sequela. The patient is tolerating medications without difficulties and is otherwise without complaint today.   Past Medical History:  Diagnosis Date  . Ankle fracture 2016  . Atrial fibrillation ([place])   . Depression   . Elevated parathyroid hormone   . Fracture of orbital floor with routine healing   . GERD (gastroesophageal reflux disease)   . History of colon polyps    benign  . Hypercalcemia   . Hypertension   . Hyponatremia   . Hypothyroid   . Macular degeneration    wet in the right and dry in the left  . OSA on CPAP   . Osteoporosis   . Peripheral vascular disease ([place])   . PSVT (paroxysmal supraventricular tachycardia) ([place])   . Rotator cuff tear   . Sinus node dysfunction ([facility])    a. s/p MDT pacemaker  . Stroke ([place])   . Urinary incontinence   . Wrist fracture 2018   Past Surgical History:  Procedure Laterality Date  . BACK SURGERY    . CATARACT EXTRACTION    . CATARACT EXTRACTION W/ INTRAOCULAR LENS  IMPLANT, BILATERAL  Bilateral   . COLONOSCOPY    . ESOPHAGOGASTRODUODENOSCOPY    . IR ANGIO INTRA EXTRACRAN SEL COM CAROTID INNOMINATE BILAT MOD SED  12/30/2017  . IR ANGIO VERTEBRAL SEL VERTEBRAL BILAT MOD SED  12/30/2017  . LAPAROSCOPIC CHOLECYSTECTOMY  08/11/1999  . LUMBAR DISC SURGERY  02/2014  . ORIF ANKLE FRACTURE Left 04/13/2015   Procedure: OPEN REDUCTION INTERNAL FIXATION (ORIF) LEFT ANKLE FRACTURE;  Surgeon: Meredith Pel, MD;  Location: [facility];  Service: Orthopedics;  Laterality: Left;  . PACEMAKER PLACEMENT Right 2012   a. MDT dual chamber PPM implanted by Dr Caryl Comes   . SHOULDER ARTHROSCOPY W/ ROTATOR CUFF REPAIR Right 08/30/2014   WITH MINI-OPEN ROTATOR CUFF REPAIR AND SUBACROMIAL DECOMPRESSION  . SHOULDER ARTHROSCOPY WITH ROTATOR CUFF REPAIR AND SUBACROMIAL DECOMPRESSION Right 08/30/2014   Procedure: SHOULDER ARTHROSCOPY WITH MINI-OPEN ROTATOR CUFF REPAIR AND SUBACROMIAL DECOMPRESSION;  Surgeon: Meredith Pel, MD;  Location: [facility];  Service: Orthopedics;  Laterality: Right;  RIGHT SHOULDER DIAGNOSTIC OPERATIVE ARTHROSCOPY, SUBACROMIAL DECOMPRESSION, MINI-OPEN ROTATOR CUFF REPAIR.  Marland Kitchen VAGINAL HYSTERECTOMY  1970    Current Outpatient Medications  Medication Sig Dispense Refill  . acetaminophen (TYLENOL) 325 MG tablet Take 650 mg by mouth every 6 (six) hours as needed.    Marland Kitchen alendronate (FOSAMAX) 70 MG tablet Take 1 tablet (70 mg total) by mouth  every 7 (seven) days. Take with a full glass of water on an empty stomach. 4 tablet 11  . amLODipine (NORVASC) 10 MG tablet Take 1 tablet (10 mg total) by mouth daily. 90 tablet 1  . apixaban (ELIQUIS) 5 MG TABS tablet Take 1 tablet (5 mg total) by mouth 2 (two) times daily. 60 tablet 3  . atorvastatin (LIPITOR) 40 MG tablet Take 1 tablet (40 mg total) by mouth daily at 6 PM. 30 tablet 3  . B Complex-C (B-COMPLEX WITH VITAMIN C) tablet Take 1 tablet by mouth daily.    . Cholecalciferol (VITAMIN D3) 5000 UNITS CAPS Take 1 capsule by mouth daily.     .  citalopram (CELEXA) 20 MG tablet TAKE 1 TABLET (20 MG TOTAL) BY MOUTH DAILY. 90 tablet 1  . desmopressin (DDAVP) 0.1 MG tablet Take 0.1 mg by mouth as needed.    Marland Kitchen lisinopril (PRINIVIL,ZESTRIL) 20 MG tablet Take 1 tablet (20 mg total) by mouth daily. 90 tablet 1  . Lutein 20 MG TABS Take 1 tablet by mouth.     . magnesium oxide (MAG-OX) 400 MG tablet Take 1 tablet (400 mg total) by mouth daily. 30 tablet 0  . metoprolol succinate (TOPROL-XL) 50 MG 24 hr tablet Take 1 tablet (50 mg total) by mouth 2 (two) times a day. Take with or immediately following a meal. 90 tablet 3  . Multiple Vitamin (MULTIVITAMIN WITH MINERALS) TABS tablet Take 1 tablet by mouth daily.    . Multiple Vitamins-Minerals (PRESERVISION AREDS PO) Take 1 tablet by mouth 2 (two) times daily.     No current facility-administered medications for this encounter.     Allergies  Allergen Reactions  . Adhesive [Tape] Swelling and Rash  . Benzalkonium Chloride Rash  . Neosporin [Neomycin-Polymyxin-Gramicidin] Swelling and Rash    Social History   Socioeconomic History  . Marital status: Married    Spouse name: Not on file  . Number of children: 4  . Years of education: 63  . Highest education level: Not on file  Occupational History  . 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 Financial resource strain: Not on file  . Food insecurity    Worry: Not on file    Inability: Not on file  . Transportation needs    Medical: Not on file    Non-medical: Not on file  Tobacco Use  . Smoking status: Former Smoker    Packs/day: 1.00    Years: 45.00    Pack years: 45.00    Types: Cigarettes  . Smokeless tobacco: Never Used  . Tobacco comment: quit smoking in 1992  Substance and Sexual Activity  . Alcohol use: Yes    Alcohol/week: 28.0 standard drinks    Types: 28 Glasses of wine per week    Comment: 08/30/2014 "16oz wine q night"  . Drug use: No  . Sexual activity: Yes    Birth  control/protection: Surgical  Lifestyle  . Physical activity    Days per week: Not on file    Minutes per session: Not on file  . Stress: Not on file  Relationships  . Social Herbalist on phone: Not on file    Gets together: Not on file    Attends religious service: Not on file    Active member of club or organization: Not on file    Attends meetings of clubs or organizations: Not on file    Relationship status:  Not on file  . Intimate partner violence    Fear of current or ex partner: Not on file    Emotionally abused: Not on file    Physically abused: Not on file    Forced sexual activity: Not on file  Other Topics Concern  . Not on file  Social History Narrative   Ms. Franzen lives with her husband at [employer]. They are retired and recently relocated to [facility] from [place]. She has 4 grown children.    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    Family History  Problem Relation Age of Onset  . Colon cancer Mother        colon  . Cancer Father        unknown type  . Stroke Sister   . Skin cancer Brother   . Heart disease Brother   . Breast cancer Paternal Aunt   . Asthma Brother     ROS- All systems are reviewed and negative except as per the HPI above  Physical Exam: Vitals:   01/18/19 1504  BP: 118/74  Pulse: 94  Weight: 91.6 kg  Height: 5\' 8"  (1.727 m)   Wt Readings from Last 3 Encounters:  01/18/19 91.6 kg  12/11/18 85.3 kg  11/26/18 85.3 kg    Labs: Lab Results  Component Value Date   NA 137 09/22/2018   K 4.4 09/22/2018   CL 102 09/22/2018   CO2 28 09/22/2018   GLUCOSE 104 (H) 09/22/2018   BUN 9 09/22/2018   CREATININE 0.61 09/22/2018   CALCIUM 10.4 (H) 09/22/2018   MG 2.1 09/22/2018   Lab Results  Component Value Date   INR 0.99 12/30/2017   Lab Results  Component Value Date   CHOL 163 08/14/2018   HDL 69 08/14/2018   LDLCALC 67 08/14/2018   TRIG 133 08/14/2018     GEN- The  patient is well appearing, alert and oriented x 3 today.   Head- normocephalic, atraumatic Eyes-  Sclera clear, conjunctiva pink Ears- hearing intact Oropharynx- clear Neck- supple, no JVP Lymph- no cervical lymphadenopathy Lungs- Clear to ausculation bilaterally, normal work of breathing Heart- irregular rate and rhythm, no murmurs, rubs or gallops, PMI not laterally displaced GI- soft, NT, ND, + BS Extremities- no clubbing, cyanosis, or edema MS- no significant deformity or atrophy Skin- no rash or lesion Psych- euthymic mood, full affect Neuro- strength and sensation are intact  EKG-afib at 94 bpm, qrs int 93 ms, qtc 445 ms Epic records reviewed    Assessment and Plan: 1. Persistent  afib Minimally symptomatic Options discussed- We could change out amlodipine for Cardizem and  try to better  rate control, continue BB Place back on sotalol but she would have to be admitted Amiodarone loading  as an outpatient, she is not too fond of SE's of drug Tikosyn also discussed but without a drug plan, this may be expensive for her and also would require hospitalization  I will discuss with Dr. Caryl Comes the end of the week when he returns off vacation and get back to pt re plan  Addendum- I discussed with Dr. Caryl Comes and  we fill that pt is minimally symptomatic in afib  and will try to rate control her. Instead of increasing BB, she is on a good dose already, will stop amlodipine and start cardizem 120 mg qd. She will f/u here 7/9 at 11:30.  Geroge Baseman Kylani Wires, [facility]  [address] [place], [place] [address] [phone]

## 2019-01-21 ENCOUNTER — Other Ambulatory Visit: Payer: Self-pay

## 2019-01-21 ENCOUNTER — Ambulatory Visit (INDEPENDENT_AMBULATORY_CARE_PROVIDER_SITE_OTHER): Payer: Medicare Other | Admitting: Pulmonary Disease

## 2019-01-21 ENCOUNTER — Encounter: Payer: Self-pay | Admitting: Pulmonary Disease

## 2019-01-21 ENCOUNTER — Other Ambulatory Visit (HOSPITAL_COMMUNITY): Payer: Self-pay | Admitting: *Deleted

## 2019-01-21 VITALS — BP 124/66 | HR 87 | Temp 98.1°F | Ht 68.0 in | Wt 202.0 lb

## 2019-01-21 DIAGNOSIS — Z9989 Dependence on other enabling machines and devices: Secondary | ICD-10-CM | POA: Diagnosis not present

## 2019-01-21 DIAGNOSIS — I6300 Cerebral infarction due to thrombosis of unspecified precerebral artery: Secondary | ICD-10-CM | POA: Diagnosis not present

## 2019-01-21 DIAGNOSIS — G4733 Obstructive sleep apnea (adult) (pediatric): Secondary | ICD-10-CM | POA: Diagnosis not present

## 2019-01-21 MED ORDER — DILTIAZEM HCL ER COATED BEADS 120 MG PO CP24: 120.0000 mg | ORAL_CAPSULE | Freq: Every day | ORAL | 6 refills | Status: DC

## 2019-01-21 NOTE — Progress Notes (Signed)
[facility]  Chief Complaint  Patient presents with  . Follow-up    osa, requesting new cpap machine    Constitutional:  BP 124/66 (BP Location: Right Arm, Cuff Size: Normal)   Pulse 87   Temp 98.1 F (36.7 C) (Oral)   Ht 5\' 8"  (1.727 m)   Wt 202 lb (91.6 kg)   LMP 07/22/1968   SpO2 98%   BMI 30.71 kg/m   Past Medical History:  A fib, HTN, Depression, Hypothyroidism, GERD, Hyponatremia, CVA, Sinus node dysfunction s/p PM, PSVT, Osteoporosis, Macular degeneration, Hypercalcemia, Colon polyps  Brief Summary:  Brittany [name] is a 85 y.o. female with obstructive sleep apnea.  Uses CPAP nightly.  Gets about 9 hours sleep per night.  Doesn't nap.  Uses full face mask.  No sinus congestion, sore throat, or aerophagia.  Her nose gets dry sometimes.  Has f/u with cardiology to adjust meds for A fib.   Physical Exam:   Appearance - well kempt   ENMT - clear nasal mucosa, midline nasal  septum, no oral exudates, no LAN, trachea midline  Respiratory - normal chest wall, normal respiratory effort, no accessory muscle use, no wheeze/rales  CV - irregular, no murmurs, no peripheral edema, radial pulses symmetric  GI - soft, non tender, no masses  Lymph - no adenopathy noted in neck and axillary areas  MSK - normal gait  Ext - no cyanosis, clubbing, or joint inflammation noted  Skin - no rashes, lesions, or ulcers  Neuro - normal strength, oriented x 3  Psych - normal mood and affect    Assessment/Plan:   Obstructive sleep apnea. - she is compliant with therapy and reports benefit - continue CPAP 8 cm H2O - will arrange for new machine since her current device is more than 83 years old  Atrial fibrillation. - she has f/u with cardiology   Patient Instructions  Will arrange for new CPAP machine and follow up in 2 months after getting new machine    Chesley Mires, MD [place] Pager: [phone]  01/21/2019, 10:29 AM  Flow Sheet    Sleep tests:  PSG 08/13/12 ([place]) >> AHI 24, SaO2 low 72%, CPAP 8 PSG 07/03/14 >> AHI 24.4, SaO2 low 76%, PLMI 81.8. CPAP 8 cm H2O >> AHI 0, +S.  CPAP 03/29/17 to 06/26/17 >> used on 90 of 90 nights with average 9 hrs 10 min.  Average AHI 1.5 with CPAP 8 cm H2O.  Cardiac tests:  Echo 12/31/17 >> EF 55 to 60%, mild AR   Medications:   Allergies as of 01/21/2019      Reactions   Adhesive [tape] Swelling, Rash   Benzalkonium Chloride Rash   Neosporin [neomycin-polymyxin-gramicidin] Swelling, Rash      Medication List       Accurate as of January 21, 2019 10:29 AM. If you have any questions, ask your nurse or doctor.        STOP taking these medications   amLODipine 10 MG tablet Commonly known as: NORVASC Stopped by: Vergie Living, RN   PRESERVISION AREDS PO Stopped by: Chesley Mires, MD     TAKE these medications   acetaminophen 325 MG tablet Commonly known as: TYLENOL Take 650 mg by mouth every 6 (six) hours as needed.   alendronate 70 MG tablet Commonly known as: FOSAMAX Take 1 tablet (70 mg total) by mouth every 7 (seven) days. Take with a full glass of water on an  empty stomach.   apixaban 5 MG Tabs tablet Commonly known as: ELIQUIS Take 1 tablet (5 mg total) by mouth 2 (two) times daily.   atorvastatin 40 MG tablet Commonly known as: LIPITOR Take 1 tablet (40 mg total) by mouth daily at 6 PM.   B-complex with vitamin C tablet Take 1 tablet by mouth daily.   citalopram 20 MG tablet Commonly known as: CELEXA TAKE 1 TABLET (20 MG TOTAL) BY MOUTH DAILY.   desmopressin 0.1 MG tablet Commonly known as: DDAVP Take 0.1 mg by mouth as needed.   diltiazem 120 MG 24 hr capsule Commonly known as: Cardizem CD Take 1 capsule (120 mg total) by mouth daily. Started by: Vergie Living, RN   lisinopril 20 MG tablet Commonly known as: ZESTRIL Take 1 tablet (20 mg total) by mouth daily.   Lutein 20 MG Tabs Take 1  tablet by mouth.   magnesium oxide 400 MG tablet Commonly known as: MAG-OX Take 1 tablet (400 mg total) by mouth daily.   metoprolol succinate 50 MG 24 hr tablet Commonly known as: TOPROL-XL Take 1 tablet (50 mg total) by mouth 2 (two) times a day. Take with or immediately following a meal.   multivitamin with minerals Tabs tablet Take 1 tablet by mouth daily.   Vitamin D3 125 MCG (5000 UT) Caps Take 1 capsule by mouth daily.       Past Surgical History:  She  has a past surgical history that includes pacemaker placement (Right, 2012); Cataract extraction; Colonoscopy; Esophagogastroduodenoscopy; Shoulder arthroscopy w/ rotator cuff repair (Right, 08/30/2014); Laparoscopic cholecystectomy (08/11/1999); Lumbar disc surgery (02/2014); Back surgery; Vaginal hysterectomy (1970); Cataract extraction w/ intraocular lens  implant, bilateral (Bilateral); Shoulder arthroscopy with rotator cuff repair and subacromial decompression (Right, 08/30/2014); ORIF ankle fracture (Left, 04/13/2015); IR ANGIO VERTEBRAL SEL VERTEBRAL BILAT MOD SED (12/30/2017); and IR ANGIO INTRA EXTRACRAN SEL COM CAROTID INNOMINATE BILAT MOD SED (12/30/2017).  Family History:  Her family history includes Asthma in her brother; Breast cancer in her paternal aunt; Cancer in her father; Colon cancer in her mother; Heart disease in her brother; Skin cancer in her brother; Stroke in her sister.  Social History:  She  reports that she has quit smoking. Her smoking use included cigarettes. She has a 45.00 pack-year smoking history. She has never used smokeless tobacco. She reports current alcohol use of about 28.0 standard drinks of alcohol per week. She reports that she does not use drugs.

## 2019-01-21 NOTE — Addendum Note (Signed)
Encounter addended by: Sherran Needs, NP on: 01/21/2019 9:09 AM  Actions taken: Clinical Note Signed

## 2019-01-21 NOTE — Patient Instructions (Signed)
Will arrange for new CPAP machine and follow up in 2 months after getting new machine

## 2019-01-25 DIAGNOSIS — M545 Low back pain: Secondary | ICD-10-CM | POA: Diagnosis not present

## 2019-01-25 DIAGNOSIS — M47816 Spondylosis without myelopathy or radiculopathy, lumbar region: Secondary | ICD-10-CM | POA: Diagnosis not present

## 2019-01-27 ENCOUNTER — Telehealth: Payer: Self-pay | Admitting: Pulmonary Disease

## 2019-01-27 NOTE — Telephone Encounter (Signed)
Called and spoke with Patient.  Patient wanted to know what DME she would be using for cpap.  [facility]  Understanding stated. Nothing further at this time.

## 2019-01-28 ENCOUNTER — Encounter (HOSPITAL_COMMUNITY): Payer: Self-pay | Admitting: Nurse Practitioner

## 2019-01-28 ENCOUNTER — Ambulatory Visit (HOSPITAL_COMMUNITY)
Admission: RE | Admit: 2019-01-28 | Discharge: 2019-01-28 | Disposition: A | Discharge status: Home / Self Care | Payer: Medicare Other | Source: Ambulatory Visit | Attending: Nurse Practitioner | Admitting: Nurse Practitioner

## 2019-01-28 ENCOUNTER — Other Ambulatory Visit (HOSPITAL_COMMUNITY): Payer: Self-pay | Admitting: *Deleted

## 2019-01-28 ENCOUNTER — Other Ambulatory Visit: Payer: Self-pay

## 2019-01-28 VITALS — BP 142/74 | HR 85 | Ht 68.0 in | Wt 201.0 lb

## 2019-01-28 DIAGNOSIS — Z825 Family history of asthma and other chronic lower respiratory diseases: Secondary | ICD-10-CM | POA: Insufficient documentation

## 2019-01-28 DIAGNOSIS — E039 Hypothyroidism, unspecified: Secondary | ICD-10-CM | POA: Insufficient documentation

## 2019-01-28 DIAGNOSIS — H3530 Unspecified macular degeneration: Secondary | ICD-10-CM | POA: Insufficient documentation

## 2019-01-28 DIAGNOSIS — I4891 Unspecified atrial fibrillation: Secondary | ICD-10-CM | POA: Diagnosis present

## 2019-01-28 DIAGNOSIS — K219 Gastro-esophageal reflux disease without esophagitis: Secondary | ICD-10-CM | POA: Diagnosis not present

## 2019-01-28 DIAGNOSIS — Z79899 Other long term (current) drug therapy: Secondary | ICD-10-CM | POA: Insufficient documentation

## 2019-01-28 DIAGNOSIS — Z800 Family history of malignant neoplasm of digestive organs: Secondary | ICD-10-CM | POA: Diagnosis not present

## 2019-01-28 DIAGNOSIS — Z808 Family history of malignant neoplasm of other organs or systems: Secondary | ICD-10-CM | POA: Insufficient documentation

## 2019-01-28 DIAGNOSIS — I4819 Other persistent atrial fibrillation: Secondary | ICD-10-CM | POA: Diagnosis not present

## 2019-01-28 DIAGNOSIS — Z888 Allergy status to other drugs, medicaments and biological substances status: Secondary | ICD-10-CM | POA: Insufficient documentation

## 2019-01-28 DIAGNOSIS — F329 Major depressive disorder, single episode, unspecified: Secondary | ICD-10-CM | POA: Insufficient documentation

## 2019-01-28 DIAGNOSIS — Z87891 Personal history of nicotine dependence: Secondary | ICD-10-CM | POA: Diagnosis not present

## 2019-01-28 DIAGNOSIS — G4733 Obstructive sleep apnea (adult) (pediatric): Secondary | ICD-10-CM | POA: Diagnosis not present

## 2019-01-28 DIAGNOSIS — Z7983 Long term (current) use of bisphosphonates: Secondary | ICD-10-CM | POA: Diagnosis not present

## 2019-01-28 DIAGNOSIS — M810 Age-related osteoporosis without current pathological fracture: Secondary | ICD-10-CM | POA: Insufficient documentation

## 2019-01-28 DIAGNOSIS — I10 Essential (primary) hypertension: Secondary | ICD-10-CM | POA: Insufficient documentation

## 2019-01-28 DIAGNOSIS — Z7901 Long term (current) use of anticoagulants: Secondary | ICD-10-CM | POA: Diagnosis not present

## 2019-01-28 DIAGNOSIS — I739 Peripheral vascular disease, unspecified: Secondary | ICD-10-CM | POA: Diagnosis not present

## 2019-01-28 DIAGNOSIS — Z8249 Family history of ischemic heart disease and other diseases of the circulatory system: Secondary | ICD-10-CM | POA: Diagnosis not present

## 2019-01-28 DIAGNOSIS — Z823 Family history of stroke: Secondary | ICD-10-CM | POA: Diagnosis not present

## 2019-01-28 MED ORDER — METOPROLOL SUCCINATE ER 50 MG PO TB24: 50.0000 mg | ORAL_TABLET | Freq: Two times a day (BID) | ORAL | 3 refills | Status: DC

## 2019-01-28 NOTE — Progress Notes (Signed)
Primary Care Physician: Donald Prose, MD Referring Physician:Dr. Tora Duck Magdalene River is a 85 y.o. female with a h/o afib, PPM, 2/2 sinus node dysfunction,  HTN, that is in the afib clinic for return of afib after seeing Dr. Caryl Comes and deciding to stop sotalol, per his note, since she had been maintianig SR and possible pro arrhythmia effect of the drug. . Per pt, she still felt she had a lot of afib on sotalol. Her last [facility] report showed 16% afib burden. She found out she was in afib as her husband noted an irregular pulse. She has noted mild increase in exertional dyspnea but no change in energy and she does not feel the afib. She does not have a drug supplement plan so has to pay for all her drugs out of pocket. Her BB dose was increased and she is rate controlled today.  F/u in afib clinic today 7/9. She is better rate controlled with 120 mg of Cardizem that was just added (and amlodipine stopped ) with EKG at 85 bpm. Her BP has remained well controlled off amlodipine. She does not feel the afib and feels well being rate controlled .  Today, she denies symptoms of palpitations, chest pain, shortness of breath, orthopnea, PND, lower extremity edema, dizziness, presyncope, syncope, or neurologic sequela. The patient is tolerating medications without difficulties and is otherwise without complaint today.   Past Medical History:  Diagnosis Date  . Ankle fracture 2016  . Atrial fibrillation ([place])   . Depression   . Elevated parathyroid hormone   . Fracture of orbital floor with routine healing   . GERD (gastroesophageal reflux disease)   . History of colon polyps    benign  . Hypercalcemia   . Hypertension   . Hyponatremia   . Hypothyroid   . Macular degeneration    wet in the right and dry in the left  . OSA on CPAP   . Osteoporosis   . Peripheral vascular disease ([place])   . PSVT (paroxysmal supraventricular tachycardia) ([place])   . Rotator cuff tear   . Sinus node  dysfunction ([facility])    a. s/p MDT pacemaker  . Stroke ([place])   . Urinary incontinence   . Wrist fracture 2018   Past Surgical History:  Procedure Laterality Date  . BACK SURGERY    . CATARACT EXTRACTION    . CATARACT EXTRACTION W/ INTRAOCULAR LENS  IMPLANT, BILATERAL Bilateral   . COLONOSCOPY    . ESOPHAGOGASTRODUODENOSCOPY    . IR ANGIO INTRA EXTRACRAN SEL COM CAROTID INNOMINATE BILAT MOD SED  12/30/2017  . IR ANGIO VERTEBRAL SEL VERTEBRAL BILAT MOD SED  12/30/2017  . LAPAROSCOPIC CHOLECYSTECTOMY  08/11/1999  . LUMBAR DISC SURGERY  02/2014  . ORIF ANKLE FRACTURE Left 04/13/2015   Procedure: OPEN REDUCTION INTERNAL FIXATION (ORIF) LEFT ANKLE FRACTURE;  Surgeon: Meredith Pel, MD;  Location: [facility];  Service: Orthopedics;  Laterality: Left;  . PACEMAKER PLACEMENT Right 2012   a. MDT dual chamber PPM implanted by Dr Caryl Comes   . SHOULDER ARTHROSCOPY W/ ROTATOR CUFF REPAIR Right 08/30/2014   WITH MINI-OPEN ROTATOR CUFF REPAIR AND SUBACROMIAL DECOMPRESSION  . SHOULDER ARTHROSCOPY WITH ROTATOR CUFF REPAIR AND SUBACROMIAL DECOMPRESSION Right 08/30/2014   Procedure: SHOULDER ARTHROSCOPY WITH MINI-OPEN ROTATOR CUFF REPAIR AND SUBACROMIAL DECOMPRESSION;  Surgeon: Meredith Pel, MD;  Location: [facility];  Service: Orthopedics;  Laterality: Right;  RIGHT SHOULDER DIAGNOSTIC OPERATIVE ARTHROSCOPY, SUBACROMIAL DECOMPRESSION, MINI-OPEN ROTATOR CUFF REPAIR.  Marland Kitchen  VAGINAL HYSTERECTOMY  1970    Current Outpatient Medications  Medication Sig Dispense Refill  . acetaminophen (TYLENOL) 325 MG tablet Take 650 mg by mouth every 6 (six) hours as needed.    Marland Kitchen alendronate (FOSAMAX) 70 MG tablet Take 1 tablet (70 mg total) by mouth every 7 (seven) days. Take with a full glass of water on an empty stomach. 4 tablet 11  . apixaban (ELIQUIS) 5 MG TABS tablet Take 1 tablet (5 mg total) by mouth 2 (two) times daily. 60 tablet 3  . atorvastatin (LIPITOR) 40 MG tablet Take 1 tablet (40 mg total) by mouth daily at 6 PM. 30 tablet  3  . B Complex-C (B-COMPLEX WITH VITAMIN C) tablet Take 1 tablet by mouth daily.    . Cholecalciferol (VITAMIN D3) 5000 UNITS CAPS Take 1 capsule by mouth daily.     . citalopram (CELEXA) 20 MG tablet TAKE 1 TABLET (20 MG TOTAL) BY MOUTH DAILY. 90 tablet 1  . desmopressin (DDAVP) 0.1 MG tablet Take 0.1 mg by mouth as needed.    . diltiazem (CARDIZEM CD) 120 MG 24 hr capsule Take 1 capsule (120 mg total) by mouth daily. 30 capsule 6  . lisinopril (PRINIVIL,ZESTRIL) 20 MG tablet Take 1 tablet (20 mg total) by mouth daily. 90 tablet 1  . Lutein 20 MG TABS Take 1 tablet by mouth.     . magnesium oxide (MAG-OX) 400 MG tablet Take 1 tablet (400 mg total) by mouth daily. 30 tablet 0  . metoprolol succinate (TOPROL-XL) 50 MG 24 hr tablet Take 1 tablet (50 mg total) by mouth 2 (two) times a day. Take with or immediately following a meal. 90 tablet 3  . Multiple Vitamin (MULTIVITAMIN WITH MINERALS) TABS tablet Take 1 tablet by mouth daily.     No current facility-administered medications for this encounter.     Allergies  Allergen Reactions  . Adhesive [Tape] Swelling and Rash  . Benzalkonium Chloride Rash  . Neosporin [Neomycin-Polymyxin-Gramicidin] Swelling and Rash    Social History   Socioeconomic History  . Marital status: Married    Spouse name: Not on file  . Number of children: 4  . Years of education: 47  . Highest education level: Not on file  Occupational History  . 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 Financial resource strain: Not on file  . Food insecurity    Worry: Not on file    Inability: Not on file  . Transportation needs    Medical: Not on file    Non-medical: Not on file  Tobacco Use  . Smoking status: Former Smoker    Packs/day: 1.00    Years: 45.00    Pack years: 45.00    Types: Cigarettes  . Smokeless tobacco: Never Used  . Tobacco comment: quit smoking in 1992  Substance and Sexual Activity  . Alcohol use: Yes     Alcohol/week: 28.0 standard drinks    Types: 28 Glasses of wine per week    Comment: 08/30/2014 "16oz wine q night"  . Drug use: No  . Sexual activity: Yes    Birth control/protection: Surgical  Lifestyle  . Physical activity    Days per week: Not on file    Minutes per session: Not on file  . Stress: Not on file  Relationships  . Social Herbalist on phone: Not on file    Gets together: Not on file  Attends religious service: Not on file    Active member of club or organization: Not on file    Attends meetings of clubs or organizations: Not on file    Relationship status: Not on file  . Intimate partner violence    Fear of current or ex partner: Not on file    Emotionally abused: Not on file    Physically abused: Not on file    Forced sexual activity: Not on file  Other Topics Concern  . Not on file  Social History Narrative   Ms. Mendel lives with her husband at [employer]. They are retired and recently relocated to [facility] from [place]. She has 4 grown children.    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    Family History  Problem Relation Age of Onset  . Colon cancer Mother        colon  . Cancer Father        unknown type  . Stroke Sister   . Skin cancer Brother   . Heart disease Brother   . Breast cancer Paternal Aunt   . Asthma Brother     ROS- All systems are reviewed and negative except as per the HPI above  Physical Exam: Vitals:   01/28/19 1141  BP: (!) 142/74  Pulse: 85  Weight: 91.2 kg  Height: 5\' 8"  (1.727 m)   Wt Readings from Last 3 Encounters:  01/28/19 91.2 kg  01/21/19 91.6 kg  01/18/19 91.6 kg    Labs: Lab Results  Component Value Date   NA 137 09/22/2018   K 4.4 09/22/2018   CL 102 09/22/2018   CO2 28 09/22/2018   GLUCOSE 104 (H) 09/22/2018   BUN 9 09/22/2018   CREATININE 0.61 09/22/2018   CALCIUM 10.4 (H) 09/22/2018   MG 2.1 09/22/2018   Lab Results  Component Value Date    INR 0.99 12/30/2017   Lab Results  Component Value Date   CHOL 163 08/14/2018   HDL 69 08/14/2018   LDLCALC 67 08/14/2018   TRIG 133 08/14/2018     GEN- The patient is well appearing, alert and oriented x 3 today.   Head- normocephalic, atraumatic Eyes-  Sclera clear, conjunctiva pink Ears- hearing intact Oropharynx- clear Neck- supple, no JVP Lymph- no cervical lymphadenopathy Lungs- Clear to ausculation bilaterally, normal work of breathing Heart- irregular rate and rhythm, no murmurs, rubs or gallops, PMI not laterally displaced GI- soft, NT, ND, + BS Extremities- no clubbing, cyanosis, or edema MS- no significant deformity or atrophy Skin- no rash or lesion Psych- euthymic mood, full affect Neuro- strength and sensation are intact  EKG-afib at 85 bpm, qrs int 93 ms, qtc 445 ms Epic records reviewed    Assessment and Plan: 1. Persistent  afib Discussed with Dr. Caryl Comes on previous visit and since she does not feel poorly in afib will plan on rate control With addition of Cardizem and stopping amlodipine, she appears to be better rate controlled and feels well Continue  Cardizem 120 mg daily and metoprolol succinate 50 mg bid Continue apixaban 5 mg bid  She has next device check 8/12 with F/u with Dr. Caryl Comes 8/24  Butch Penny C. Nathifa Ritthaler, [facility] [address] [place], [place] [address] [phone]

## 2019-02-02 ENCOUNTER — Telehealth: Payer: Self-pay | Admitting: Pulmonary Disease

## 2019-02-02 NOTE — Telephone Encounter (Signed)
Called [facility] ([facility]) and spoke with Janette. Brittany [name] stated that she was able to find pt's sleep study so all she needs is pt's OV to be sent over. I have verified fax number that OV needed to be sent to and have sent it to [facility] in Janette's attn. Nothing further needed.

## 2019-02-03 ENCOUNTER — Telehealth: Payer: Self-pay | Admitting: Pulmonary Disease

## 2019-02-03 NOTE — Telephone Encounter (Signed)
Pt's OV notes prior to sleep study has been faxed to [facility] in Janette's attn. Nothing further needed.

## 2019-02-03 NOTE — Telephone Encounter (Addendum)
Called and spoke with Janette. Stated to her the info I sent to her from pt's OV 04/20/14 was pt's first visit with [place]. Per Madlyn Frankel, they need to have pt's initial visit from [place] in regards to pt being diagnosed with sleep apnea. Stated to [place] we would try to see if we can find this info and call her back once we have an update. Madlyn Frankel also stated she has placed a call with Medical Records to see if they could also help her out. Madlyn Frankel stated if anything else was needed from [place] that she would return call.

## 2019-02-03 NOTE — Telephone Encounter (Signed)
Returned call to patient and explained [facility] needs a copy of her original sleep study from [place]. Informed patient she will need to come by office and sign a medical release form. We will need the Drs name/address/phone #. Pt will gather info and come by office when she can. Nothing further can be done at this time.

## 2019-02-08 ENCOUNTER — Telehealth: Payer: Self-pay | Admitting: Nurse Practitioner

## 2019-02-08 MED ORDER — DILTIAZEM HCL ER COATED BEADS 120 MG PO CP24: 120.0000 mg | ORAL_CAPSULE | Freq: Every day | ORAL | 1 refills | Status: DC

## 2019-02-08 NOTE — Telephone Encounter (Signed)
Pt calling stating that she would like for her medication diltiazem sent to [facility] and not [facility]. Please address

## 2019-02-16 ENCOUNTER — Other Ambulatory Visit: Payer: Self-pay

## 2019-02-16 ENCOUNTER — Ambulatory Visit (INDEPENDENT_AMBULATORY_CARE_PROVIDER_SITE_OTHER): Payer: Medicare Other | Admitting: Adult Health

## 2019-02-16 ENCOUNTER — Encounter: Payer: Self-pay | Admitting: Adult Health

## 2019-02-16 VITALS — BP 119/52 | HR 105 | Temp 97.3°F | Ht 68.0 in | Wt 203.8 lb

## 2019-02-16 DIAGNOSIS — I6300 Cerebral infarction due to thrombosis of unspecified precerebral artery: Secondary | ICD-10-CM

## 2019-02-16 DIAGNOSIS — Z9989 Dependence on other enabling machines and devices: Secondary | ICD-10-CM

## 2019-02-16 DIAGNOSIS — I480 Paroxysmal atrial fibrillation: Secondary | ICD-10-CM | POA: Diagnosis not present

## 2019-02-16 DIAGNOSIS — E785 Hyperlipidemia, unspecified: Secondary | ICD-10-CM

## 2019-02-16 DIAGNOSIS — I10 Essential (primary) hypertension: Secondary | ICD-10-CM | POA: Diagnosis not present

## 2019-02-16 DIAGNOSIS — G4733 Obstructive sleep apnea (adult) (pediatric): Secondary | ICD-10-CM

## 2019-02-16 NOTE — Patient Instructions (Signed)
Continue Eliquis (apixaban) daily  and Lipitor for secondary stroke prevention  Continue to follow up with PCP regarding cholesterol and blood pressure management   Continue to follow with cardiology for atrial fibrillation and Eliquis management  Please contact Dr. Arlean Hopping office to schedule recommended follow-up visit with repeat carotid ultrasound [phone]  Continue to monitor blood pressure at home  Maintain strict control of hypertension with blood pressure goal below 130/90, diabetes with hemoglobin A1c goal below 6.5% and cholesterol with LDL cholesterol (bad cholesterol) goal below 70 mg/dL. I also advised the patient to eat a healthy diet with plenty of whole grains, cereals, fruits and vegetables, exercise regularly and maintain ideal body weight.         Thank you for coming to see [place] at [facility]. I hope we have been able to provide you high quality care today.  You may receive a patient satisfaction survey over the next few weeks. We would appreciate your feedback and comments so that we may continue to improve ourselves and the health of our patients.

## 2019-02-16 NOTE — Progress Notes (Signed)
[facility] [address] [place]. [place] [address] [phone]       OFFICE FOLLOW UP NOTE  [name] Date of Birth:  [birth year] Medical Record Number:  [id number]   Reason for Referral:  hospital stroke follow up  CHIEF COMPLAINT:  Chief Complaint  Patient presents with  . 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 Cerebrovascular Accident    Stable.  Husband out in waiting room.  Cardiology working with HR( Dr. Caryl Comes).    HPI:  Brittany [name] is a 85 y.o. female who is being followed in this office for possible right brain stroke (unable to obtain MRI d/t pacer) on 12/30/2017.  PMH of HTN, pacemaker, atrial fibrillation, PVD, HLD, OSA on CPAP PSVT.  She presented with left facial weakness, left arm weakness/numbness and slurred speech.  CT did not show acute infarct but CTA showed distal right M2 branch occlusion and bilateral ICA calcified plaque with right ICA 70% stenosis.  Underwent cerebral angiogram which showed small basilar apex aneurysm and right ICA 60% stenosis and right MCA reconstitution.  All imaging from hospitalization described in detail below and personally reviewed.  Eliquis initiated along with aspirin 81 mg for atrial fibrillation and secondary stroke prevention as she was not on DO AC prior to admission.  Patient initially evaluated in this office after hospital discharge on 02/06/2018.  She denied any residual deficits or recurring symptoms.  She continued on all medications including Eliquis, aspirin 81 mg and atorvastatin without reported side effects.  Possible situational anxiety vs post stroke depression but declined additional depression management.   08/14/2018 update: She returns today for 68-month follow-up visit.  She continues to do well from a stroke standpoint.  She continues to follow with vascular surgery and did obtain carotid ultrasound on 08/13/2018 which showed right ICA 40 to 59% stenosis and left ICA 40 to 59%  stenosis.  Unsure of need of future imaging and/or follow-up with vascular surgery.  She continues on Eliquis without side effects of bleeding or bruising.  She continues to follow with cardiology regularly for management.  Continues on atorvastatin without side effects myalgias. She has not had lipid panel obtained since hospital discharge. Blood pressure today satisfactory at 127/79. She does not monitor at home. She does endorse compliance with CPAP for OSA management.  Denies new or worsening stroke/TIA symptoms.   02/16/2019 update: Brittany [name] is being seen today for 74-month follow-up.  She has been doing well from a neurological standpoint.  Continues on Eliquis and atorvastatin for secondary stroke prevention without reported side effects.  Lipid panel obtained at prior office visit with LDL 67.  Blood pressure stable at 119/52.  Ongoing compliance with CPAP for OSA management. Recommended to follow-up with vascular surgery in 6 months for repeat carotid ultrasound with prior in 07/2018.  She has not been contacted at this time to schedule this follow-up.  Denies new or worsening stroke/TIA symptoms.   ROS:   14 system review of systems performed and negative with exception of no complaints  PMH:  Past Medical History:  Diagnosis Date  . Ankle fracture 2016  . Atrial fibrillation ([place])   . Depression   . Elevated parathyroid hormone   . Fracture of orbital floor with routine healing   . GERD (gastroesophageal reflux disease)   . History of colon polyps    benign  . Hypercalcemia   . Hypertension   . Hyponatremia   .  Hypothyroid   . Macular degeneration    wet in the right and dry in the left  . OSA on CPAP   . Osteoporosis   . Peripheral vascular disease ([place])   . PSVT (paroxysmal supraventricular tachycardia) ([place])   . Rotator cuff tear   . Sinus node dysfunction ([facility])    a. s/p MDT pacemaker  . Stroke ([place])   . Urinary incontinence   . Wrist fracture 2018    PSH:  Past  Surgical History:  Procedure Laterality Date  . BACK SURGERY    . CATARACT EXTRACTION    . CATARACT EXTRACTION W/ INTRAOCULAR LENS  IMPLANT, BILATERAL Bilateral   . COLONOSCOPY    . ESOPHAGOGASTRODUODENOSCOPY    . IR ANGIO INTRA EXTRACRAN SEL COM CAROTID INNOMINATE BILAT MOD SED  12/30/2017  . IR ANGIO VERTEBRAL SEL VERTEBRAL BILAT MOD SED  12/30/2017  . LAPAROSCOPIC CHOLECYSTECTOMY  08/11/1999  . LUMBAR DISC SURGERY  02/2014  . ORIF ANKLE FRACTURE Left 04/13/2015   Procedure: OPEN REDUCTION INTERNAL FIXATION (ORIF) LEFT ANKLE FRACTURE;  Surgeon: Meredith Pel, MD;  Location: [facility];  Service: Orthopedics;  Laterality: Left;  . PACEMAKER PLACEMENT Right 2012   a. MDT dual chamber PPM implanted by Dr Caryl Comes   . SHOULDER ARTHROSCOPY W/ ROTATOR CUFF REPAIR Right 08/30/2014   WITH MINI-OPEN ROTATOR CUFF REPAIR AND SUBACROMIAL DECOMPRESSION  . SHOULDER ARTHROSCOPY WITH ROTATOR CUFF REPAIR AND SUBACROMIAL DECOMPRESSION Right 08/30/2014   Procedure: SHOULDER ARTHROSCOPY WITH MINI-OPEN ROTATOR CUFF REPAIR AND SUBACROMIAL DECOMPRESSION;  Surgeon: Meredith Pel, MD;  Location: [facility];  Service: Orthopedics;  Laterality: Right;  RIGHT SHOULDER DIAGNOSTIC OPERATIVE ARTHROSCOPY, SUBACROMIAL DECOMPRESSION, MINI-OPEN ROTATOR CUFF REPAIR.  Marland Kitchen VAGINAL HYSTERECTOMY  1970    Social History:  Social History   Socioeconomic History  . Marital status: Married    Spouse name: Not on file  . Number of children: 4  . Years of education: 37  . Highest education level: Not on file  Occupational History  . 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 Financial resource strain: Not on file  . Food insecurity    Worry: Not on file    Inability: Not on file  . Transportation needs    Medical: Not on file    Non-medical: Not on file  Tobacco Use  . Smoking status: Former Smoker    Packs/day: 1.00    Years: 45.00    Pack years: 45.00    Types: Cigarettes  . Smokeless tobacco: Never  Used  . Tobacco comment: quit smoking in 1992  Substance and Sexual Activity  . Alcohol use: Yes    Alcohol/week: 28.0 standard drinks    Types: 28 Glasses of wine per week    Comment: 08/30/2014 "16oz wine q night"  . Drug use: No  . Sexual activity: Yes    Birth control/protection: Surgical  Lifestyle  . Physical activity    Days per week: Not on file    Minutes per session: Not on file  . Stress: Not on file  Relationships  . Social Herbalist on phone: Not on file    Gets together: Not on file    Attends religious service: Not on file    Active member of club or organization: Not on file    Attends meetings of clubs or organizations: Not on file    Relationship status: Not on file  . Intimate partner violence  Fear of current or ex partner: Not on file    Emotionally abused: Not on file    Physically abused: Not on file    Forced sexual activity: Not on file  Other Topics Concern  . Not on file  Social History Narrative   Brittany [name] lives with her husband at [employer]. They are retired and recently relocated to [facility] from [place]. She has 4 grown children.    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    Family History:  Family History  Problem Relation Age of Onset  . Colon cancer Mother        colon  . Cancer Father        unknown type  . Stroke Sister   . Skin cancer Brother   . Heart disease Brother   . Breast cancer Paternal Aunt   . Asthma Brother     Medications:   Current Outpatient Medications on File Prior to Visit  Medication Sig Dispense Refill  . acetaminophen (TYLENOL) 325 MG tablet Take 650 mg by mouth every 6 (six) hours as needed.    Marland Kitchen alendronate (FOSAMAX) 70 MG tablet Take 1 tablet (70 mg total) by mouth every 7 (seven) days. Take with a full glass of water on an empty stomach. 4 tablet 11  . apixaban (ELIQUIS) 5 MG TABS tablet Take 1 tablet (5 mg total) by mouth 2 (two) times daily. 60  tablet 3  . atorvastatin (LIPITOR) 40 MG tablet Take 1 tablet (40 mg total) by mouth daily at 6 PM. 30 tablet 3  . B Complex-C (B-COMPLEX WITH VITAMIN C) tablet Take 1 tablet by mouth daily.    . Cholecalciferol (VITAMIN D3) 5000 UNITS CAPS Take 1 capsule by mouth daily.     . citalopram (CELEXA) 20 MG tablet TAKE 1 TABLET (20 MG TOTAL) BY MOUTH DAILY. 90 tablet 1  . desmopressin (DDAVP) 0.1 MG tablet Take 0.2 mg by mouth as needed.     . diltiazem (CARDIZEM CD) 120 MG 24 hr capsule Take 1 capsule (120 mg total) by mouth daily. 90 capsule 1  . lisinopril (PRINIVIL,ZESTRIL) 20 MG tablet Take 1 tablet (20 mg total) by mouth daily. 90 tablet 1  . Lutein 20 MG TABS Take 1 tablet by mouth.     . magnesium oxide (MAG-OX) 400 MG tablet Take 1 tablet (400 mg total) by mouth daily. 30 tablet 0  . metoprolol succinate (TOPROL-XL) 50 MG 24 hr tablet Take 1 tablet (50 mg total) by mouth 2 (two) times a day. Take with or immediately following a meal. 60 tablet 3  . Multiple Vitamin (MULTIVITAMIN WITH MINERALS) TABS tablet Take 1 tablet by mouth daily.    . Multiple Vitamins-Minerals (PRESERVISION AREDS 2 PO) Take by mouth 2 (two) times a day. AREDS 2 eye vitamins    . Omega-3 Fatty Acids (FISH OIL) 1200 MG CAPS Take by mouth 2 (two) times a day.     No current facility-administered medications on file prior to visit.     Allergies:   Allergies  Allergen Reactions  . Adhesive [Tape] Swelling and Rash  . Benzalkonium Chloride Rash  . Neosporin [Neomycin-Polymyxin-Gramicidin] Swelling and Rash     Physical Exam  Vitals:   02/16/19 1344  Temp: (!) 97.3 F (36.3 C)  Weight: 203 lb 12.8 oz (92.4 kg)  Height: 5\' 8"  (1.727 m)   Body mass index is 30.99 kg/m. No exam data present  General: well developed, well nourished, pleasant elderly Caucasian female, seated, in no evident distress Head: head normocephalic and atraumatic.   Neck: supple with no carotid or supraclavicular bruits  Cardiovascular: regular rate and rhythm, no murmurs Musculoskeletal: no deformity Skin:  no rash/petichiae Vascular:  Normal pulses all extremities  Neurologic Exam Mental Status: Awake and fully alert. Oriented to place and time. Recent and remote memory intact. Attention span, concentration and fund of knowledge appropriate. Mood and affect appropriate.  Cranial Nerves: Pupils equal, briskly reactive to light. Extraocular movements full without nystagmus. Visual fields full to confrontation. Hearing intact. Facial sensation intact. Face, tongue, palate moves normally and symmetrically.  Motor: Normal bulk and tone. Normal strength in all tested extremity muscles. Sensory.: intact to touch , pinprick , position and vibratory sensation.  Coordination: Rapid alternating movements normal in all extremities. Finger-to-nose and heel-to-shin performed accurately bilaterally. Gait and Station: Arises from chair without difficulty. Stance is normal. Gait demonstrates normal stride length and balance Reflexes: 1+ and symmetric. Toes downgoing.      Diagnostic Data (Labs, Imaging, Testing)  CT HEAD WO CONTRAST 12/30/17 IMPRESSION: 1. No acute abnormality.  No change from the prior study. 2. ASPECTS is 10 3. Moderate atrophy and moderate chronic microvascular ischemia in the white matter.  CT ANGIO HEAD W OR WO CONTRAST CT ANGIO NECK W OR WO CONTRAST 12/30/17 IMPRESSION: 1. Distal right M2 branch occlusion. 2. Heavily calcified plaque about both carotid bifurcations with 70% proximal right ICA stenosis. 3. Severe right vertebral artery origin stenosis.  CEREBRAL ANGIOGRAM 01/01/18 IMPRESSION: Approximately 60% stenosis of the right internal carotid artery at the bulb by the NASCET criteria. Less than 30% stenosis of the left internal carotid artery at the bulb. Approximately 50% stenosis of the right external carotid artery origin. Approximately 4 mm x 4.1 mm basilar apex aneurysm.  PLAN: Findings were reviewed with the patient. The following day the patient remained clinically fully intact. The right groin appeared soft. Distal pulses were palpable in the dorsalis pedis, and posterior tibial regions. Following discharge, the patient will be followed in the Neuro Interventional clinic with ultrasound of the carotids in 3 weeks Time.  ECHOCARDIOGRAM 12/31/17 Study Conclusions - Left ventricle: The cavity size was normal. Wall thickness was   normal. Systolic function was normal. The estimated ejection   fraction was in the range of 55% to 60%. Wall motion was normal;   there were no regional wall motion abnormalities. There was a   reduced contribution of atrial contraction to ventricular   filling, due to increased ventricular diastolic pressure or   atrial contractile dysfunction (consider atrial mechanical   stunning after recentt atrial fibrillation). - Aortic valve: There was mild regurgitation. - Left atrium: The atrium was mildly dilated.  VAS UR CAROTID DUPLEX BILATERAL 01/26/18 Final Interpretation: Right Carotid: Velocities in the right ICA are consistent with a 40-59%        stenosis. Significant mixed plaque throughout, difficult to        evaluate bifrucation. Left Carotid: Velocities in the left ICA are consistent with a 1-39% stenosis.       Significant mixed plaque throughout, difficult to evaluate       bifrucation. Vertebrals: Bilateral vertebral arteries demonstrate antegrade flow.  VAS US CAROTID DUPLEX BILATERAL 08/13/2018 Summary: Right Carotid: Velocities in the right ICA are consistent with a 40-59%                stenosis.  Left Carotid: Velocities in the left  ICA are consistent with a 40-59% stenosis.      ASSESSMENT: Brittany [name] is a 85 y.o. year old female here with possible right brain infarct on 12/30/17 secondary to AF not on [facility]. Vascular risk factors include AF, HTN, and HLD.   Recovered well from a neurological standpoint without residual deficits.   PLAN: -Continue Eliquis (apixaban) daily  and lipitor  for secondary stroke prevention -F/u with PCP regarding your HLD and HTN management -f/u with cardiologist regarding AF and eliquis management -f/u with vascular surgery and recommended to contact office to schedule recommended 27-month follow-up visit with repeat carotid duplex -phone number provided to patient -Maintain strict control of hypertension with blood pressure goal below 130/90, diabetes with hemoglobin A1c goal below 6.5% and cholesterol with LDL cholesterol (bad cholesterol) goal below 70 mg/dL. I also advised the patient to eat a healthy diet with plenty of whole grains, cereals, fruits and vegetables, exercise regularly and maintain ideal body weight.  Stable from stroke standpoint and recommend follow-up as needed   Greater than 50% of time during this 25 minute visit was spent on counseling, explanation of diagnosis of possible right brain stroke, reviewing risk factor management of atrial fibrillation, HLD and HTN, planning of further management, discussion with patient and family and coordination of care   Venancio Poisson, [facility]  [facility] [address] [place], [place] [address]  Phone [phone] Fax [phone]

## 2019-02-19 ENCOUNTER — Telehealth: Payer: Self-pay | Admitting: Pulmonary Disease

## 2019-02-19 NOTE — Telephone Encounter (Signed)
I called [facility] to check on the order for CPAP. There were no CSR specialist available atr the time so I left my name and number for them to return the call. I will await a return call and will hold in triage.

## 2019-02-22 NOTE — Telephone Encounter (Signed)
Sent message to [facility] for an update.

## 2019-02-22 NOTE — Telephone Encounter (Signed)
Called and spoke with pt to see if she had heard anything in regards to receiving a new cpap machine and pt said that she has not. Asked pt if she had come by office to fill out release of records so we could get her sleep study from [place] and pt said she came by about 1-2 weeks ago and filled out the release of records form.  Stated to pt we would check in regards to the sleep study to see if we have received it and also check on status of her receiving new machine and pt verbalized understanding.   [facility], is there any way you can help [place] out in regards to pt receiving a new cpap machine? Order was placed 01/21/2019 by [facility].

## 2019-02-23 NOTE — Telephone Encounter (Signed)
Loreen Freud sent to Vella Kohler D        I gave this message to Jeanett Schlein to give you an update.

## 2019-02-24 ENCOUNTER — Telehealth: Payer: Self-pay | Admitting: Pulmonary Disease

## 2019-02-24 DIAGNOSIS — Z9989 Dependence on other enabling machines and devices: Secondary | ICD-10-CM

## 2019-02-24 DIAGNOSIS — G4733 Obstructive sleep apnea (adult) (pediatric): Secondary | ICD-10-CM

## 2019-02-24 NOTE — Telephone Encounter (Signed)
Patient is returning phone call.  Patient phone number is [phone].

## 2019-02-24 NOTE — Telephone Encounter (Signed)
LMTCB x1 for pt.  

## 2019-02-24 NOTE — Telephone Encounter (Signed)
Spoke with pt. She is inquiring about her most recent CPAP order. Pt is needing a new CPAP machine. There was an order placed on 01/21/2019 for this but was sent to [facility], pt wants her order to go to [facility]. Order has been placed. Nothing further was needed.

## 2019-02-25 ENCOUNTER — Telehealth: Payer: Self-pay | Admitting: Internal Medicine

## 2019-02-25 MED ORDER — APIXABAN 5 MG PO TABS: 5.0000 mg | ORAL_TABLET | Freq: Two times a day (BID) | ORAL | 1 refills | Status: DC

## 2019-02-25 NOTE — Telephone Encounter (Signed)
Age 85, weight 92.4kg, SCr 0.61 on 09/22/18, visit in afib clinic July 2020, afib indication. This pharmacy is not in [place], will print rx and fax.

## 2019-02-25 NOTE — Telephone Encounter (Signed)
New Message    *STAT* If patient is at the pharmacy, call can be transferred to refill team.   1. Which medications need to be refilled? (please list name of each medication and dose if known) apixaban (ELIQUIS) 5 MG TABS tablet    2. Which pharmacy/location (including street and city if local pharmacy) is medication to be sent to? [facility] Phone [phone] Fax [phone]  3. Do they need a 30 day or 90 day supply? [place]

## 2019-03-03 ENCOUNTER — Ambulatory Visit (INDEPENDENT_AMBULATORY_CARE_PROVIDER_SITE_OTHER): Payer: Medicare Other | Admitting: *Deleted

## 2019-03-03 DIAGNOSIS — I495 Sick sinus syndrome: Secondary | ICD-10-CM

## 2019-03-03 LAB — CUP PACEART REMOTE DEVICE CHECK
Battery Impedance: 2049 Ohm
Battery Remaining Longevity: 30 mo
Battery Voltage: 2.74 V
Brady Statistic AP VP Percent: 3 %
Brady Statistic AP VS Percent: 0 %
Brady Statistic AS VP Percent: 9 %
Brady Statistic AS VS Percent: 88 %
Date Time Interrogation Session: 20200812132000
Implantable Lead Implant Date: 20121213
Implantable Lead Implant Date: 20121213
Implantable Lead Location: 753859
Implantable Lead Location: 753860
Implantable Lead Model: 4076
Implantable Lead Model: 4076
Implantable Lead Serial Number: 835025
Implantable Lead Serial Number: 869269
Implantable Pulse Generator Implant Date: 20121213
Lead Channel Impedance Value: 364 Ohm
Lead Channel Impedance Value: 447 Ohm
Lead Channel Pacing Threshold Amplitude: 0.5 V
Lead Channel Pacing Threshold Amplitude: 0.625 V
Lead Channel Pacing Threshold Pulse Width: 0.4 ms
Lead Channel Pacing Threshold Pulse Width: 0.4 ms
Lead Channel Setting Pacing Amplitude: 2 V
Lead Channel Setting Pacing Amplitude: 2.5 V
Lead Channel Setting Pacing Pulse Width: 0.4 ms
Lead Channel Setting Sensing Sensitivity: 4 mV

## 2019-03-12 ENCOUNTER — Encounter: Payer: Self-pay | Admitting: Cardiology

## 2019-03-12 NOTE — Progress Notes (Signed)
Remote pacemaker transmission.   

## 2019-03-15 ENCOUNTER — Encounter: Payer: Self-pay | Admitting: Internal Medicine

## 2019-03-15 ENCOUNTER — Other Ambulatory Visit: Payer: Self-pay

## 2019-03-15 ENCOUNTER — Ambulatory Visit (INDEPENDENT_AMBULATORY_CARE_PROVIDER_SITE_OTHER): Payer: Medicare Other | Admitting: Internal Medicine

## 2019-03-15 VITALS — BP 150/92 | HR 86 | Ht 68.0 in | Wt 200.8 lb

## 2019-03-15 DIAGNOSIS — I6300 Cerebral infarction due to thrombosis of unspecified precerebral artery: Secondary | ICD-10-CM | POA: Diagnosis not present

## 2019-03-15 DIAGNOSIS — I480 Paroxysmal atrial fibrillation: Secondary | ICD-10-CM | POA: Diagnosis not present

## 2019-03-15 DIAGNOSIS — Z950 Presence of cardiac pacemaker: Secondary | ICD-10-CM | POA: Diagnosis not present

## 2019-03-15 DIAGNOSIS — I495 Sick sinus syndrome: Secondary | ICD-10-CM | POA: Diagnosis not present

## 2019-03-15 LAB — CUP PACEART INCLINIC DEVICE CHECK
Battery Impedance: 2076 Ohm
Battery Remaining Longevity: 30 mo
Battery Voltage: 2.74 V
Brady Statistic AP VP Percent: 3 %
Brady Statistic AP VS Percent: 0 %
Brady Statistic AS VP Percent: 9 %
Brady Statistic AS VS Percent: 88 %
Date Time Interrogation Session: 20200824152953
Implantable Lead Implant Date: 20121213
Implantable Lead Implant Date: 20121213
Implantable Lead Location: 753859
Implantable Lead Location: 753860
Implantable Lead Model: 4076
Implantable Lead Model: 4076
Implantable Lead Serial Number: 835025
Implantable Lead Serial Number: 869269
Implantable Pulse Generator Implant Date: 20121213
Lead Channel Impedance Value: 371 Ohm
Lead Channel Impedance Value: 487 Ohm
Lead Channel Pacing Threshold Amplitude: 0.5 V
Lead Channel Pacing Threshold Amplitude: 0.5 V
Lead Channel Pacing Threshold Amplitude: 0.625 V
Lead Channel Pacing Threshold Pulse Width: 0.4 ms
Lead Channel Pacing Threshold Pulse Width: 0.4 ms
Lead Channel Pacing Threshold Pulse Width: 0.4 ms
Lead Channel Setting Pacing Amplitude: 2 V
Lead Channel Setting Pacing Amplitude: 2.5 V
Lead Channel Setting Pacing Pulse Width: 0.4 ms
Lead Channel Setting Sensing Sensitivity: 4 mV

## 2019-03-15 MED ORDER — METOPROLOL SUCCINATE ER 50 MG PO TB24: 50.0000 mg | ORAL_TABLET | Freq: Every day | ORAL | 3 refills | Status: DC

## 2019-03-15 MED ORDER — DILTIAZEM HCL ER COATED BEADS 120 MG PO CP24: 120.0000 mg | ORAL_CAPSULE | Freq: Two times a day (BID) | ORAL | 3 refills | Status: DC

## 2019-03-15 NOTE — Progress Notes (Signed)
Patient Care Team: Donald Prose, MD as PCP - General (Family Medicine) Bjorn Loser, MD as Consulting Physician (Urology) Deboraha Sprang, MD as Consulting Physician (Cardiology) Chesley Mires, MD as Consulting Physician (Pulmonary Disease) Melrose Nakayama, MD as Consulting Physician (Orthopedic Surgery) Noralyn Pick, MD as Referring Physician (Ophthalmology) Paulla Dolly Tamala Fothergill, DPM as Consulting Physician (Podiatry)   HPI  Brittany [name] Plan is a 85 y.o. female Seen in followup for  pacer implanted 12/12 for sinus node dysfunction.  She has atrial fibrillation for which she takes sotalol currently of 120 mg twice daily.   Cardiac evaluation has included a Myoview 8/12 which demonstrated no ischemia and normal left ventricular function  DATE TEST EF   6/19 Echo   55-60 %                   thromboembolic risk factors include age-43 hypertension-1 for a CHADS-  score of 2 and a CHADS-VASc score of 4     Date Cr K Mg Hgb   5/17   1.9   1/18  0.59 4.5   14.7  11/18 0.55 4.4  15.8  3/20 0.61 4.4 2.1 12.9   Since stopping sotalol she has had more dyspnea and fatigue  No bleeding   Past Medical History:  Diagnosis Date  . Ankle fracture 2016  . Atrial fibrillation ([place])   . Depression   . Elevated parathyroid hormone   . Fracture of orbital floor with routine healing   . GERD (gastroesophageal reflux disease)   . History of colon polyps    benign  . Hypercalcemia   . Hypertension   . Hyponatremia   . Hypothyroid   . Macular degeneration    wet in the right and dry in the left  . OSA on CPAP   . Osteoporosis   . Peripheral vascular disease ([place])   . PSVT (paroxysmal supraventricular tachycardia) ([place])   . Rotator cuff tear   . Sinus node dysfunction ([facility])    a. s/p MDT pacemaker  . Stroke ([place])   . Urinary incontinence   . Wrist fracture 2018    Past Surgical History:  Procedure Laterality Date  . BACK SURGERY    . CATARACT EXTRACTION     . CATARACT EXTRACTION W/ INTRAOCULAR LENS  IMPLANT, BILATERAL Bilateral   . COLONOSCOPY    . ESOPHAGOGASTRODUODENOSCOPY    . IR ANGIO INTRA EXTRACRAN SEL COM CAROTID INNOMINATE BILAT MOD SED  12/30/2017  . IR ANGIO VERTEBRAL SEL VERTEBRAL BILAT MOD SED  12/30/2017  . LAPAROSCOPIC CHOLECYSTECTOMY  08/11/1999  . LUMBAR DISC SURGERY  02/2014  . ORIF ANKLE FRACTURE Left 04/13/2015   Procedure: OPEN REDUCTION INTERNAL FIXATION (ORIF) LEFT ANKLE FRACTURE;  Surgeon: Meredith Pel, MD;  Location: [facility];  Service: Orthopedics;  Laterality: Left;  . PACEMAKER PLACEMENT Right 2012   a. MDT dual chamber PPM implanted by Dr Caryl Comes   . SHOULDER ARTHROSCOPY W/ ROTATOR CUFF REPAIR Right 08/30/2014   WITH MINI-OPEN ROTATOR CUFF REPAIR AND SUBACROMIAL DECOMPRESSION  . SHOULDER ARTHROSCOPY WITH ROTATOR CUFF REPAIR AND SUBACROMIAL DECOMPRESSION Right 08/30/2014   Procedure: SHOULDER ARTHROSCOPY WITH MINI-OPEN ROTATOR CUFF REPAIR AND SUBACROMIAL DECOMPRESSION;  Surgeon: Meredith Pel, MD;  Location: [facility];  Service: Orthopedics;  Laterality: Right;  RIGHT SHOULDER DIAGNOSTIC OPERATIVE ARTHROSCOPY, SUBACROMIAL DECOMPRESSION, MINI-OPEN ROTATOR CUFF REPAIR.  Marland Kitchen VAGINAL HYSTERECTOMY  1970    Current Outpatient Medications  Medication Sig Dispense Refill  . acetaminophen (  TYLENOL) 325 MG tablet Take 650 mg by mouth every 6 (six) hours as needed.    Marland Kitchen alendronate (FOSAMAX) 70 MG tablet Take 1 tablet (70 mg total) by mouth every 7 (seven) days. Take with a full glass of water on an empty stomach. 4 tablet 11  . apixaban (ELIQUIS) 5 MG TABS tablet Take 1 tablet (5 mg total) by mouth 2 (two) times daily. 180 tablet 1  . atorvastatin (LIPITOR) 40 MG tablet Take 1 tablet (40 mg total) by mouth daily at 6 PM. 30 tablet 3  . B Complex-C (B-COMPLEX WITH VITAMIN C) tablet Take 1 tablet by mouth daily.    . Cholecalciferol (VITAMIN D3) 5000 UNITS CAPS Take 1 capsule by mouth daily.     . citalopram (CELEXA) 20 MG tablet TAKE 1  TABLET (20 MG TOTAL) BY MOUTH DAILY. 90 tablet 1  . desmopressin (DDAVP) 0.1 MG tablet Take 0.2 mg by mouth as needed.     . diltiazem (CARDIZEM CD) 120 MG 24 hr capsule Take 1 capsule (120 mg total) by mouth daily. 90 capsule 1  . lisinopril (PRINIVIL,ZESTRIL) 20 MG tablet Take 1 tablet (20 mg total) by mouth daily. 90 tablet 1  . Lutein 20 MG TABS Take 1 tablet by mouth.     . magnesium oxide (MAG-OX) 400 MG tablet Take 1 tablet (400 mg total) by mouth daily. 30 tablet 0  . metoprolol succinate (TOPROL-XL) 50 MG 24 hr tablet Take 1 tablet (50 mg total) by mouth 2 (two) times a day. Take with or immediately following a meal. 60 tablet 3  . Multiple Vitamin (MULTIVITAMIN WITH MINERALS) TABS tablet Take 1 tablet by mouth daily.    . Multiple Vitamins-Minerals (PRESERVISION AREDS 2 PO) Take by mouth 2 (two) times a day. AREDS 2 eye vitamins    . Omega-3 Fatty Acids (FISH OIL) 1200 MG CAPS Take by mouth 2 (two) times a day.     No current facility-administered medications for this visit.     Allergies  Allergen Reactions  . Adhesive [Tape] Swelling and Rash  . Benzalkonium Chloride Rash  . Neosporin [Neomycin-Polymyxin-Gramicidin] Swelling and Rash      Review of Systems negative except from HPI and PMH  Physical Exam BP (!) 150/92   Pulse 86   Ht 5\' 8"  (1.727 m)   Wt 200 lb 12.8 oz (91.1 kg)   LMP 07/22/1968   SpO2 97%   BMI 30.53 kg/m  Well developed and nourished in no acute distress HENT normal Neck supple with JVP-  flat *  Clear Regular rate and rhythm, no murmurs or gallops Abd-soft with active BS No Clubbing cyanosis 1+ edema Skin-warm and dry A & Oriented  Grossly normal sensory and motor function  ECG afib @ 86 -/09/38   Assessment and  Plan Atrial fibrillation-persistent     Sinus bradycardia   HFpEF  Pacemaker-[employer]tronic  The patient's device was interrogated.  The information was reviewed. No changes were made in the programming.       Hypertension     Discussed rhtyhm v rate control, and we have elected to pursue former,  She will look into cost of dofetilide and then will admit either for resumption of sotalol or initiation of dofetilide if she can afford it  She has not missed her apixoban  She is very rapid in her AFib with 40% > 100  Will increase metoprolol 50>>100 bid  And dilt 120>>240 bid  This may also help w  BP   We spent more than 50% of our >25 min visit in face to face counseling regarding the above

## 2019-03-15 NOTE — Patient Instructions (Signed)
Medication Instructions:  Your physician has recommended you make the following change in your medication:   1. Increase your diltiazem to 120mg  capsule, once in the AM, once in the PM 2. Increase your Metoprolol Succinate to 100mg  (2 tablet) in the AM, and 50mg  (one tablet) in the PM  Check on pricing for Tikosyn 50mg  tablet, two times per day  Labwork: None ordered.  Testing/Procedures: None ordered.  Follow-Up: Your physician recommends that you schedule a follow-up appointment in:   Next week with the Afib clinic to discuss Tikosyn or Sotolol admission.   Any Other Special Instructions Will Be Listed Below (If Applicable).     If you need a refill on your cardiac medications before your next appointment, please call your pharmacy.

## 2019-03-16 ENCOUNTER — Other Ambulatory Visit (HOSPITAL_COMMUNITY): Payer: Self-pay | Admitting: *Deleted

## 2019-03-16 ENCOUNTER — Telehealth: Payer: Self-pay | Admitting: Internal Medicine

## 2019-03-16 ENCOUNTER — Telehealth: Payer: Self-pay | Admitting: Pharmacist

## 2019-03-16 NOTE — Telephone Encounter (Signed)
Pt calling back stating that Dr. Caryl Comes wanting her to check the pricing of Tikosyn 50 mg tablet. Pt stated that Tikosyn does not come in 50 mg. It only comes in 125mg , 250 mg or 500 mg. Pt would like a call back concerning this matter. Please address

## 2019-03-16 NOTE — Telephone Encounter (Signed)
Patient notified of recommendation to stop citalopram and be switched to a different medication. Pt will contact PCP to switch.

## 2019-03-16 NOTE — Telephone Encounter (Signed)
Will forward to Dr. Caryl Comes for dose clarification

## 2019-03-16 NOTE — Telephone Encounter (Signed)
Medication list reviewed in anticipation of upcoming Tikosyn initiation. Patient is taking citalopram, which is the most QTc prolonging SSRI. Last QTc 378 (01/28/2019). Recommend pt f/u with PCP to follow up regarding potential change to less QTc prolonging SSRI/SNRI (e.g., sertraline, duloxetine).  Patient is anticoagulated on apixaban 5 mg BID (appropriate dose). Please ensure that patient has not missed any anticoagulation doses in the 3 weeks prior to Tikosyn initiation.   Patient will need to be counseled to avoid use of Benadryl while on Tikosyn and in the 2-3 days prior to Tikosyn initiation.

## 2019-03-17 NOTE — Telephone Encounter (Signed)
Pt.notified

## 2019-03-17 NOTE — Telephone Encounter (Signed)
500 mg tablets

## 2019-03-18 ENCOUNTER — Other Ambulatory Visit (HOSPITAL_COMMUNITY)
Admission: RE | Admit: 2019-03-18 | Discharge: 2019-03-18 | Disposition: A | Discharge status: Home / Self Care | Payer: Medicare Other | Source: Ambulatory Visit | Attending: Internal Medicine | Admitting: Internal Medicine

## 2019-03-18 DIAGNOSIS — Z01812 Encounter for preprocedural laboratory examination: Secondary | ICD-10-CM | POA: Insufficient documentation

## 2019-03-18 DIAGNOSIS — Z20828 Contact with and (suspected) exposure to other viral communicable diseases: Secondary | ICD-10-CM | POA: Insufficient documentation

## 2019-03-18 LAB — SARS CORONAVIRUS 2 (TAT 6-24 HRS): SARS Coronavirus 2: NEGATIVE

## 2019-03-22 ENCOUNTER — Encounter (HOSPITAL_COMMUNITY): Payer: Self-pay | Admitting: *Deleted

## 2019-03-22 ENCOUNTER — Other Ambulatory Visit: Payer: Self-pay

## 2019-03-22 ENCOUNTER — Encounter (HOSPITAL_COMMUNITY): Payer: Self-pay | Admitting: Nurse Practitioner

## 2019-03-22 ENCOUNTER — Ambulatory Visit (HOSPITAL_COMMUNITY)
Admission: RE | Admit: 2019-03-22 | Discharge: 2019-03-22 | Disposition: A | Discharge status: Home / Self Care | Payer: Medicare Other | Source: Ambulatory Visit | Attending: Physician Assistant | Admitting: Physician Assistant

## 2019-03-22 ENCOUNTER — Inpatient Hospital Stay (HOSPITAL_COMMUNITY)
Admission: EM | Admit: 2019-03-22 | Discharge: 2019-03-25 | DRG: 309 | Disposition: A | Discharge status: Home / Self Care | Payer: Medicare Other | Source: Ambulatory Visit | Attending: Internal Medicine | Admitting: Internal Medicine

## 2019-03-22 VITALS — BP 144/88 | HR 95 | Ht 68.0 in | Wt 204.4 lb

## 2019-03-22 DIAGNOSIS — Z950 Presence of cardiac pacemaker: Secondary | ICD-10-CM

## 2019-03-22 DIAGNOSIS — M810 Age-related osteoporosis without current pathological fracture: Secondary | ICD-10-CM | POA: Diagnosis present

## 2019-03-22 DIAGNOSIS — I4819 Other persistent atrial fibrillation: Secondary | ICD-10-CM | POA: Diagnosis not present

## 2019-03-22 DIAGNOSIS — I495 Sick sinus syndrome: Secondary | ICD-10-CM | POA: Diagnosis not present

## 2019-03-22 DIAGNOSIS — Z90710 Acquired absence of both cervix and uterus: Secondary | ICD-10-CM

## 2019-03-22 DIAGNOSIS — I4891 Unspecified atrial fibrillation: Secondary | ICD-10-CM | POA: Diagnosis present

## 2019-03-22 DIAGNOSIS — Z800 Family history of malignant neoplasm of digestive organs: Secondary | ICD-10-CM

## 2019-03-22 DIAGNOSIS — F329 Major depressive disorder, single episode, unspecified: Secondary | ICD-10-CM | POA: Diagnosis present

## 2019-03-22 DIAGNOSIS — Z883 Allergy status to other anti-infective agents status: Secondary | ICD-10-CM

## 2019-03-22 DIAGNOSIS — Z9049 Acquired absence of other specified parts of digestive tract: Secondary | ICD-10-CM

## 2019-03-22 DIAGNOSIS — E039 Hypothyroidism, unspecified: Secondary | ICD-10-CM | POA: Diagnosis present

## 2019-03-22 DIAGNOSIS — I5032 Chronic diastolic (congestive) heart failure: Secondary | ICD-10-CM | POA: Diagnosis present

## 2019-03-22 DIAGNOSIS — Z888 Allergy status to other drugs, medicaments and biological substances status: Secondary | ICD-10-CM

## 2019-03-22 DIAGNOSIS — K219 Gastro-esophageal reflux disease without esophagitis: Secondary | ICD-10-CM | POA: Diagnosis not present

## 2019-03-22 DIAGNOSIS — Z91048 Other nonmedicinal substance allergy status: Secondary | ICD-10-CM

## 2019-03-22 DIAGNOSIS — Z20828 Contact with and (suspected) exposure to other viral communicable diseases: Secondary | ICD-10-CM | POA: Diagnosis not present

## 2019-03-22 DIAGNOSIS — G4733 Obstructive sleep apnea (adult) (pediatric): Secondary | ICD-10-CM | POA: Diagnosis present

## 2019-03-22 DIAGNOSIS — Z8673 Personal history of transient ischemic attack (TIA), and cerebral infarction without residual deficits: Secondary | ICD-10-CM | POA: Diagnosis not present

## 2019-03-22 DIAGNOSIS — Z87891 Personal history of nicotine dependence: Secondary | ICD-10-CM

## 2019-03-22 DIAGNOSIS — Z8249 Family history of ischemic heart disease and other diseases of the circulatory system: Secondary | ICD-10-CM | POA: Diagnosis not present

## 2019-03-22 DIAGNOSIS — I739 Peripheral vascular disease, unspecified: Secondary | ICD-10-CM | POA: Diagnosis not present

## 2019-03-22 DIAGNOSIS — H3530 Unspecified macular degeneration: Secondary | ICD-10-CM | POA: Diagnosis not present

## 2019-03-22 DIAGNOSIS — Z823 Family history of stroke: Secondary | ICD-10-CM | POA: Diagnosis not present

## 2019-03-22 DIAGNOSIS — Z7901 Long term (current) use of anticoagulants: Secondary | ICD-10-CM

## 2019-03-22 DIAGNOSIS — I110 Hypertensive heart disease with heart failure: Secondary | ICD-10-CM | POA: Diagnosis present

## 2019-03-22 DIAGNOSIS — Z79899 Other long term (current) drug therapy: Secondary | ICD-10-CM

## 2019-03-22 DIAGNOSIS — Z8719 Personal history of other diseases of the digestive system: Secondary | ICD-10-CM

## 2019-03-22 LAB — BASIC METABOLIC PANEL
Anion gap: 9 (ref 5–15)
BUN: 10 mg/dL (ref 8–23)
CO2: 24 mmol/L (ref 22–32)
Calcium: 10.1 mg/dL (ref 8.9–10.3)
Chloride: 104 mmol/L (ref 98–111)
Creatinine, Ser: 0.64 mg/dL (ref 0.44–1.00)
GFR calc Af Amer: >60 mL/min (ref >60)
GFR calc non Af Amer: >60 mL/min (ref >60)
Glucose, Bld: 103 mg/dL — ABNORMAL HIGH (ref 70–99)
Potassium: 4.7 mmol/L (ref 3.5–5.1)
Sodium: 137 mmol/L (ref 135–145)

## 2019-03-22 LAB — MAGNESIUM: Magnesium: 2 mg/dL (ref 1.7–2.4)

## 2019-03-22 MED ORDER — METOPROLOL SUCCINATE ER 50 MG PO TB24: 50.0000 mg | ORAL_TABLET | Freq: Every day | ORAL | Status: DC

## 2019-03-22 MED ORDER — APIXABAN 5 MG PO TABS
5.0000 mg | ORAL_TABLET | Freq: Two times a day (BID) | ORAL | Status: DC
  Administered 2019-03-22 – 2019-03-25 (×6): 5 mg via ORAL
  Filled 2019-03-22 (×6): qty 1

## 2019-03-22 MED ORDER — ALENDRONATE SODIUM 70 MG PO TABS: 70.0000 mg | ORAL_TABLET | ORAL | Status: DC

## 2019-03-22 MED ORDER — SERTRALINE HCL 50 MG PO TABS
50.0000 mg | ORAL_TABLET | Freq: Every day | ORAL | Status: DC
  Administered 2019-03-23 – 2019-03-25 (×3): 50 mg via ORAL
  Filled 2019-03-22 (×3): qty 1

## 2019-03-22 MED ORDER — B COMPLEX-C PO TABS
1.0000 | ORAL_TABLET | Freq: Every day | ORAL | Status: DC
  Administered 2019-03-23 – 2019-03-25 (×3): 1 via ORAL
  Filled 2019-03-22 (×4): qty 1

## 2019-03-22 MED ORDER — SODIUM CHLORIDE 0.9% FLUSH
3.0000 mL | Freq: Two times a day (BID) | INTRAVENOUS | Status: DC
  Administered 2019-03-22 – 2019-03-25 (×3): 3 mL via INTRAVENOUS

## 2019-03-22 MED ORDER — ADULT MULTIVITAMIN W/MINERALS CH
1.0000 | ORAL_TABLET | Freq: Every day | ORAL | Status: DC
  Administered 2019-03-23 – 2019-03-25 (×3): 1 via ORAL
  Filled 2019-03-22 (×3): qty 1

## 2019-03-22 MED ORDER — OMEGA-3-ACID ETHYL ESTERS 1 G PO CAPS
1.0000 g | ORAL_CAPSULE | Freq: Every day | ORAL | Status: DC
  Administered 2019-03-23 – 2019-03-25 (×3): 1 g via ORAL
  Filled 2019-03-22 (×3): qty 1

## 2019-03-22 MED ORDER — SODIUM CHLORIDE 0.9% FLUSH: 3.0000 mL | INTRAVENOUS | Status: DC | PRN

## 2019-03-22 MED ORDER — METOPROLOL SUCCINATE ER 50 MG PO TB24
50.0000 mg | ORAL_TABLET | Freq: Every day | ORAL | Status: DC
  Administered 2019-03-23 – 2019-03-24 (×2): 50 mg via ORAL
  Filled 2019-03-22 (×2): qty 1

## 2019-03-22 MED ORDER — VITAMIN D 25 MCG (1000 UNIT) PO TABS
5000.0000 [IU] | ORAL_TABLET | Freq: Every day | ORAL | Status: DC
  Administered 2019-03-23 – 2019-03-25 (×3): 5000 [IU] via ORAL
  Filled 2019-03-22 (×3): qty 5

## 2019-03-22 MED ORDER — METOPROLOL SUCCINATE ER 100 MG PO TB24
100.0000 mg | ORAL_TABLET | Freq: Every day | ORAL | Status: DC
  Administered 2019-03-23 – 2019-03-25 (×3): 100 mg via ORAL
  Filled 2019-03-22 (×3): qty 1

## 2019-03-22 MED ORDER — SERTRALINE HCL 50 MG PO TABS: 50.0000 mg | ORAL_TABLET | Freq: Every day | ORAL | 2 refills | Status: DC

## 2019-03-22 MED ORDER — SODIUM CHLORIDE 0.9 % IV SOLN: 250.0000 mL | INTRAVENOUS | Status: DC | PRN

## 2019-03-22 MED ORDER — MAGNESIUM OXIDE 400 (241.3 MG) MG PO TABS
400.0000 mg | ORAL_TABLET | Freq: Every day | ORAL | Status: DC
  Administered 2019-03-23 – 2019-03-25 (×3): 400 mg via ORAL
  Filled 2019-03-22 (×4): qty 1

## 2019-03-22 MED ORDER — DOFETILIDE 500 MCG PO CAPS
500.0000 ug | ORAL_CAPSULE | Freq: Two times a day (BID) | ORAL | Status: DC
  Administered 2019-03-22 – 2019-03-25 (×6): 500 ug via ORAL
  Filled 2019-03-22 (×6): qty 1

## 2019-03-22 MED ORDER — DILTIAZEM HCL ER COATED BEADS 120 MG PO CP24
120.0000 mg | ORAL_CAPSULE | Freq: Two times a day (BID) | ORAL | Status: DC
  Administered 2019-03-22 – 2019-03-25 (×6): 120 mg via ORAL
  Filled 2019-03-22 (×6): qty 1

## 2019-03-22 MED ORDER — SODIUM CHLORIDE 0.9% FLUSH: 3.0000 mL | Freq: Two times a day (BID) | INTRAVENOUS | Status: DC

## 2019-03-22 MED ORDER — ATORVASTATIN CALCIUM 40 MG PO TABS
40.0000 mg | ORAL_TABLET | Freq: Every day | ORAL | Status: DC
  Administered 2019-03-22 – 2019-03-24 (×3): 40 mg via ORAL
  Filled 2019-03-22 (×3): qty 1

## 2019-03-22 MED ORDER — LISINOPRIL 20 MG PO TABS
20.0000 mg | ORAL_TABLET | Freq: Every day | ORAL | Status: DC
  Administered 2019-03-23 – 2019-03-25 (×3): 20 mg via ORAL
  Filled 2019-03-22 (×4): qty 1

## 2019-03-22 MED ORDER — ACETAMINOPHEN 325 MG PO TABS: 650.0000 mg | ORAL_TABLET | Freq: Four times a day (QID) | ORAL | Status: DC | PRN

## 2019-03-22 NOTE — Progress Notes (Signed)
Primary Care Physician: Donald Prose, MD Referring Physician:Dr. Tora Duck Magdalene River is a 85 y.o. female with a h/o afib, PPM, 2/2 sinus node dysfunction,  HTN, that was seen initially  in the afib clinic for return of afib after seeing Dr. Caryl Comes and deciding to stop sotalol, per his note, since she had been maintianig SR and possible pro arrhythmia effect of the drug. This was in July . Per pt, she felt she had a lot of afib on sotalol. Her last [facility] report showed 16% afib burden. She found out she was in afib as her husband noted an irregular pulse. She had noted mild increase in exertional dyspnea but no change in energy and she did  not feel the afib. She does not have a drug supplement plan so has to pay for all her drugs out of pocket. Her BB dose was increased and she is rate controlled today. Discussed with Dr. Caryl Comes and since she was reasonably rate controlled, the paln was rate controlled going forward.   She saw Dr. Caryl Comes in f/u  August 24  and was not tolerating afib as well. The  decision was made to admit for tikosyn. Her citalopram was switched to sertraline  50 mg qd  last Tuesday at PharmD recommendation to prevent long qtc on dofetilide. No benadryl use for at least the last 3 days.  She is now in the afib clinic for admission. She does not have any drug plan but is she is OK to go thru [facility] to get drug. She is aware of price. She has not missed any doses of eliquis for at least 3 weeks.  No benadryl use for at least 3 days.Linus Orn test negative   Today, she denies symptoms of palpitations, chest pain, shortness of breath, orthopnea, PND, lower extremity edema, dizziness, presyncope, syncope, or neurologic sequela. The patient is tolerating medications without difficulties and is otherwise without complaint today.   Past Medical History:  Diagnosis Date  . Ankle fracture 2016  . Atrial fibrillation ([place])   . Depression   . Elevated parathyroid hormone   . Fracture  of orbital floor with routine healing   . GERD (gastroesophageal reflux disease)   . History of colon polyps    benign  . Hypercalcemia   . Hypertension   . Hyponatremia   . Hypothyroid   . Macular degeneration    wet in the right and dry in the left  . OSA on CPAP   . Osteoporosis   . Peripheral vascular disease ([place])   . PSVT (paroxysmal supraventricular tachycardia) ([place])   . Rotator cuff tear   . Sinus node dysfunction ([facility])    a. s/p MDT pacemaker  . Stroke ([place])   . Urinary incontinence   . Wrist fracture 2018   Past Surgical History:  Procedure Laterality Date  . BACK SURGERY    . CATARACT EXTRACTION    . CATARACT EXTRACTION W/ INTRAOCULAR LENS  IMPLANT, BILATERAL Bilateral   . COLONOSCOPY    . ESOPHAGOGASTRODUODENOSCOPY    . IR ANGIO INTRA EXTRACRAN SEL COM CAROTID INNOMINATE BILAT MOD SED  12/30/2017  . IR ANGIO VERTEBRAL SEL VERTEBRAL BILAT MOD SED  12/30/2017  . LAPAROSCOPIC CHOLECYSTECTOMY  08/11/1999  . LUMBAR DISC SURGERY  02/2014  . ORIF ANKLE FRACTURE Left 04/13/2015   Procedure: OPEN REDUCTION INTERNAL FIXATION (ORIF) LEFT ANKLE FRACTURE;  Surgeon: Meredith Pel, MD;  Location: [facility];  Service: Orthopedics;  Laterality: Left;  . PACEMAKER PLACEMENT Right 2012   a. MDT dual chamber PPM implanted by Dr Caryl Comes   . SHOULDER ARTHROSCOPY W/ ROTATOR CUFF REPAIR Right 08/30/2014   WITH MINI-OPEN ROTATOR CUFF REPAIR AND SUBACROMIAL DECOMPRESSION  . SHOULDER ARTHROSCOPY WITH ROTATOR CUFF REPAIR AND SUBACROMIAL DECOMPRESSION Right 08/30/2014   Procedure: SHOULDER ARTHROSCOPY WITH MINI-OPEN ROTATOR CUFF REPAIR AND SUBACROMIAL DECOMPRESSION;  Surgeon: Meredith Pel, MD;  Location: [facility];  Service: Orthopedics;  Laterality: Right;  RIGHT SHOULDER DIAGNOSTIC OPERATIVE ARTHROSCOPY, SUBACROMIAL DECOMPRESSION, MINI-OPEN ROTATOR CUFF REPAIR.  Marland Kitchen VAGINAL HYSTERECTOMY  1970    Current Outpatient Medications  Medication Sig Dispense Refill  . acetaminophen (TYLENOL) 325 MG  tablet Take 650 mg by mouth every 6 (six) hours as needed.    Marland Kitchen alendronate (FOSAMAX) 70 MG tablet Take 1 tablet (70 mg total) by mouth every 7 (seven) days. Take with a full glass of water on an empty stomach. 4 tablet 11  . apixaban (ELIQUIS) 5 MG TABS tablet Take 1 tablet (5 mg total) by mouth 2 (two) times daily. 180 tablet 1  . atorvastatin (LIPITOR) 40 MG tablet Take 1 tablet (40 mg total) by mouth daily at 6 PM. 30 tablet 3  . B Complex-C (B-COMPLEX WITH VITAMIN C) tablet Take 1 tablet by mouth daily.    . Cholecalciferol (VITAMIN D3) 5000 UNITS CAPS Take 1 capsule by mouth daily.     Marland Kitchen desmopressin (DDAVP) 0.1 MG tablet Take 0.2 mg by mouth as needed.     . diltiazem (CARDIZEM CD) 120 MG 24 hr capsule Take 1 capsule (120 mg total) by mouth 2 (two) times daily. 180 capsule 3  . lisinopril (PRINIVIL,ZESTRIL) 20 MG tablet Take 1 tablet (20 mg total) by mouth daily. 90 tablet 1  . Lutein 20 MG TABS Take 1 tablet by mouth.     . magnesium oxide (MAG-OX) 400 MG tablet Take 1 tablet (400 mg total) by mouth daily. 30 tablet 0  . metoprolol succinate (TOPROL-XL) 50 MG 24 hr tablet Take 1 tablet (50 mg total) by mouth daily. Take 2 tablets (100mg ) in the AM, Take 1 tablet (50mg ) in the PM 90 tablet 3  . Multiple Vitamin (MULTIVITAMIN WITH MINERALS) TABS tablet Take 1 tablet by mouth daily.    . Multiple Vitamins-Minerals (PRESERVISION AREDS 2 PO) Take by mouth 2 (two) times a day. AREDS 2 eye vitamins    . Omega-3 Fatty Acids (FISH OIL) 1200 MG CAPS Take by mouth 2 (two) times a day.     No current facility-administered medications for this encounter.     Allergies  Allergen Reactions  . Adhesive [Tape] Swelling and Rash  . Benzalkonium Chloride Rash  . Neosporin [Neomycin-Polymyxin-Gramicidin] Swelling and Rash    Social History   Socioeconomic History  . Marital status: Married    Spouse name: Not on file  . Number of children: 4  . Years of education: 65  . Highest education level:  Not on file  Occupational History  . 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 Financial resource strain: Not on file  . Food insecurity    Worry: Not on file    Inability: Not on file  . Transportation needs    Medical: Not on file    Non-medical: Not on file  Tobacco Use  . Smoking status: Former Smoker    Packs/day: 1.00    Years: 45.00    Pack years: 45.00  Types: Cigarettes  . Smokeless tobacco: Never Used  . Tobacco comment: quit smoking in 1992  Substance and Sexual Activity  . Alcohol use: Yes    Alcohol/week: 28.0 standard drinks    Types: 28 Glasses of wine per week    Comment: 08/30/2014 "16oz wine q night"  . Drug use: No  . Sexual activity: Yes    Birth control/protection: Surgical  Lifestyle  . Physical activity    Days per week: Not on file    Minutes per session: Not on file  . Stress: Not on file  Relationships  . Social Herbalist on phone: Not on file    Gets together: Not on file    Attends religious service: Not on file    Active member of club or organization: Not on file    Attends meetings of clubs or organizations: Not on file    Relationship status: Not on file  . Intimate partner violence    Fear of current or ex partner: Not on file    Emotionally abused: Not on file    Physically abused: Not on file    Forced sexual activity: Not on file  Other Topics Concern  . Not on file  Social History Narrative   Ms. Moad lives with her husband at [employer]. They are retired and recently relocated to [facility] from [place]. She has 4 grown children.    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    Family History  Problem Relation Age of Onset  . Colon cancer Mother        colon  . Cancer Father        unknown type  . Stroke Sister   . Skin cancer Brother   . Heart disease Brother   . Breast cancer Paternal Aunt   . Asthma Brother     ROS- All systems are  reviewed and negative except as per the HPI above  Physical Exam: There were no vitals filed for this visit. Wt Readings from Last 3 Encounters:  03/15/19 91.1 kg  02/16/19 92.4 kg  01/28/19 91.2 kg    Labs: Lab Results  Component Value Date   NA 137 09/22/2018   K 4.4 09/22/2018   CL 102 09/22/2018   CO2 28 09/22/2018   GLUCOSE 104 (H) 09/22/2018   BUN 9 09/22/2018   CREATININE 0.61 09/22/2018   CALCIUM 10.4 (H) 09/22/2018   MG 2.1 09/22/2018   Lab Results  Component Value Date   INR 0.99 12/30/2017   Lab Results  Component Value Date   CHOL 163 08/14/2018   HDL 69 08/14/2018   LDLCALC 67 08/14/2018   TRIG 133 08/14/2018     GEN- The patient is well appearing, alert and oriented x 3 today.   Head- normocephalic, atraumatic Eyes-  Sclera clear, conjunctiva pink Ears- hearing intact Oropharynx- clear Neck- supple, no JVP Lymph- no cervical lymphadenopathy Lungs- Clear to ausculation bilaterally, normal work of breathing Heart- irregular rate and rhythm, no murmurs, rubs or gallops, PMI not laterally displaced GI- soft, NT, ND, + BS Extremities- no clubbing, cyanosis, or edema MS- no significant deformity or atrophy Skin- no rash or lesion Psych- euthymic mood, full affect Neuro- strength and sensation are intact  EKG-afib at 95 bpm, qrs int 93 ms, qtc 487 ms today but EKG in SR shows qt at 419 ms.  Epic records reviewed    Assessment and Plan: 1. Persistent  afib Discussed with Dr. Caryl Comes on previous visit, and has decided to go into hospital today for tikosyn load as she stopped sotalol in July and has become more  fatigued and has exertional dyspnea in persisitent afib. No benadryl use Properly anticoagulated on eliquis No drug plan but plans to go thru [facility].   Continue  Cardizem 120 mg bid  and metoprolol succinate  100  mg am and 50 mg pm but may have to be down titrated when resumes SR (has PPM)  Continue apixaban 5 mg bid Bmet/mag pending     Butch Penny C. Philomina Leon, [facility] [address] [place], [place] [address] [phone]

## 2019-03-22 NOTE — H&P (Addendum)
Primary Care Physician: Donald Prose, MD Referring Physician:Dr. Tora Duck Magdalene River is a 85 y.o. female with a h/o afib, PPM, 2/2 sinus node dysfunction,  HTN, that was seen initially  in the afib clinic for return of afib after seeing Dr. Caryl Comes and deciding to stop sotalol, per his note, since she had been maintianig SR and possible pro arrhythmia effect of the drug. This was in July . Per pt, she felt she had a lot of afib on sotalol. Her last [facility] report showed 16% afib burden. She found out she was in afib as her husband noted an irregular pulse. She had noted mild increase in exertional dyspnea but no change in energy and she did  not feel the afib. She does not have a drug supplement plan so has to pay for all her drugs out of pocket. Her BB dose was increased and she is rate controlled today. Discussed with Dr. Caryl Comes and since she was reasonably rate controlled, the paln was rate controlled going forward.   She saw Dr. Caryl Comes in f/u  August 24  and was not tolerating afib as well. The  decision was made to admit for tikosyn. Her citalopram was switched to sertraline  50 mg qd  last Tuesday at PharmD recommendation to prevent long qtc on dofetilide. No benadryl use for at least the last 3 days.  She was seen in the afib clinic this am for admission. She does not have any drug plan but is she is OK to go thru [facility] to get drug. She is aware of price. She has not missed any doses of eliquis for at least 3 weeks.  No benadryl use for at least 3 days. Sars test negative   Today, she denies symptoms of palpitations, chest pain, shortness of breath, orthopnea, PND, lower extremity edema, dizziness, presyncope, syncope, or neurologic sequela. The patient is tolerating medications without difficulties and is otherwise without complaint today.   Past Medical History:  Diagnosis Date  . Ankle fracture 2016  . Atrial fibrillation ([place])   . Depression   . Elevated parathyroid hormone   .  Fracture of orbital floor with routine healing   . GERD (gastroesophageal reflux disease)   . History of colon polyps    benign  . Hypercalcemia   . Hypertension   . Hyponatremia   . Hypothyroid   . Macular degeneration    wet in the right and dry in the left  . OSA on CPAP   . Osteoporosis   . Peripheral vascular disease ([place])   . PSVT (paroxysmal supraventricular tachycardia) ([place])   . Rotator cuff tear   . Sinus node dysfunction ([facility])    a. s/p MDT pacemaker  . Stroke ([place])   . Urinary incontinence   . Wrist fracture 2018   Past Surgical History:  Procedure Laterality Date  . BACK SURGERY    . CATARACT EXTRACTION    . CATARACT EXTRACTION W/ INTRAOCULAR LENS  IMPLANT, BILATERAL Bilateral   . COLONOSCOPY    . ESOPHAGOGASTRODUODENOSCOPY    . IR ANGIO INTRA EXTRACRAN SEL COM CAROTID INNOMINATE BILAT MOD SED  12/30/2017  . IR ANGIO VERTEBRAL SEL VERTEBRAL BILAT MOD SED  12/30/2017  . LAPAROSCOPIC CHOLECYSTECTOMY  08/11/1999  . LUMBAR DISC SURGERY  02/2014  . ORIF ANKLE FRACTURE Left 04/13/2015   Procedure: OPEN REDUCTION INTERNAL FIXATION (ORIF) LEFT ANKLE FRACTURE;  Surgeon: Meredith Pel, MD;  Location: [facility];  Service:  Orthopedics;  Laterality: Left;  . PACEMAKER PLACEMENT Right 2012   a. MDT dual chamber PPM implanted by Dr Caryl Comes   . SHOULDER ARTHROSCOPY W/ ROTATOR CUFF REPAIR Right 08/30/2014   WITH MINI-OPEN ROTATOR CUFF REPAIR AND SUBACROMIAL DECOMPRESSION  . SHOULDER ARTHROSCOPY WITH ROTATOR CUFF REPAIR AND SUBACROMIAL DECOMPRESSION Right 08/30/2014   Procedure: SHOULDER ARTHROSCOPY WITH MINI-OPEN ROTATOR CUFF REPAIR AND SUBACROMIAL DECOMPRESSION;  Surgeon: Meredith Pel, MD;  Location: [facility];  Service: Orthopedics;  Laterality: Right;  RIGHT SHOULDER DIAGNOSTIC OPERATIVE ARTHROSCOPY, SUBACROMIAL DECOMPRESSION, MINI-OPEN ROTATOR CUFF REPAIR.  Marland Kitchen VAGINAL HYSTERECTOMY  1970    No current facility-administered medications for this encounter.     Allergies  Allergen  Reactions  . Adhesive [Tape] Swelling and Rash  . Benzalkonium Chloride Rash  . Neosporin [Neomycin-Polymyxin-Gramicidin] Swelling and Rash    Social History   Socioeconomic History  . Marital status: Married    Spouse name: Not on file  . Number of children: 4  . Years of education: 34  . Highest education level: Not on file  Occupational History  . 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 Financial resource strain: Not on file  . Food insecurity    Worry: Not on file    Inability: Not on file  . Transportation needs    Medical: Not on file    Non-medical: Not on file  Tobacco Use  . Smoking status: Former Smoker    Packs/day: 1.00    Years: 45.00    Pack years: 45.00    Types: Cigarettes  . Smokeless tobacco: Never Used  . Tobacco comment: quit smoking in 1992  Substance and Sexual Activity  . Alcohol use: Yes    Alcohol/week: 28.0 standard drinks    Types: 28 Glasses of wine per week    Comment: 08/30/2014 "16oz wine q night"  . Drug use: No  . Sexual activity: Yes    Birth control/protection: Surgical  Lifestyle  . Physical activity    Days per week: Not on file    Minutes per session: Not on file  . Stress: Not on file  Relationships  . Social Herbalist on phone: Not on file    Gets together: Not on file    Attends religious service: Not on file    Active member of club or organization: Not on file    Attends meetings of clubs or organizations: Not on file    Relationship status: Not on file  . Intimate partner violence    Fear of current or ex partner: Not on file    Emotionally abused: Not on file    Physically abused: Not on file    Forced sexual activity: Not on file  Other Topics Concern  . Not on file  Social History Narrative   Ms. Gue lives with her husband at [employer]. They are retired and recently relocated to [facility] [place] [facility] from [place]. She has 4 grown children.    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    Family History  Problem Relation Age of Onset  . Colon cancer Mother        colon  . Cancer Father        unknown type  . Stroke Sister   . Skin cancer Brother   . Heart disease Brother   . Breast cancer Paternal Aunt   . Asthma Brother  ROS- All systems are reviewed and negative except as per the HPI above  Physical Exam: Vitals:   03/22/19 1112  BP: (!) 144/88  Pulse: 95  Weight: 92.7 kg  Height: 5\' 8"  (1.727 m)   Wt Readings from Last 3 Encounters:  03/15/19 91.1 kg  02/16/19 92.4 kg  01/28/19 91.2 kg   Labs: Lab Results  Component Value Date   NA 137 03/22/2019   K 4.7 03/22/2019   CL 104 03/22/2019   CO2 24 03/22/2019   GLUCOSE 103 (H) 03/22/2019   BUN 10 03/22/2019   CREATININE 0.64 03/22/2019   CALCIUM 10.1 03/22/2019   MG 2.0 03/22/2019   Lab Results  Component Value Date   INR 0.99 12/30/2017   Lab Results  Component Value Date   CHOL 163 08/14/2018   HDL 69 08/14/2018   LDLCALC 67 08/14/2018   TRIG 133 08/14/2018     GEN- The patient is well appearing, alert and oriented x 3 today.   Head- normocephalic, atraumatic Eyes-  Sclera clear, conjunctiva pink Ears- hearing intact Oropharynx- clear Neck- supple, no JVP Lymph- no cervical lymphadenopathy Lungs- Clear to ausculation bilaterally, normal work of breathing Heart- irregular rate and rhythm, no murmurs, rubs or gallops, PMI not laterally displaced GI- soft, NT, ND, + BS Extremities- no clubbing, cyanosis, or edema MS- no significant deformity or atrophy Skin- no rash or lesion Psych- euthymic mood, full affect Neuro- strength and sensation are intact  EKG-afib at 95 bpm, qrs int 93 ms, qtc 487 ms today but EKG in SR shows qt at 419 ms, personally reviewed. Epic records reviewed   Assessment and Plan: 1. Persistent  afib Discussed with Dr. Caryl Comes on previous visit, and has decided to go into hospital today for tikosyn load as she  stopped sotalol in July and has become more  fatigued and has exertional dyspnea in persisitent afib. No benadryl use Properly anticoagulated on eliquis No drug plan but plans to go thru [facility].   Continue  Cardizem 120 mg bid  and metoprolol succinate  100  mg am and 50 mg pm but may have to be down titrated when resumes SR (has PPM)  Continue apixaban 5 mg bid Bmet/mag pending  Reviewed QTc with Dr. Caryl Comes. He is OK with starting Tikosyn at 500 mg BID.    Shirley Friar, PA-C  Pager: [phone]  03/22/2019 12:17 PM   Atrial fibrillation-persistent    Sinus bradycardia   HFpEF  Pacemaker-Medtronic       Hypertension   For dofetilide initiation for symptomatic atrial fibrillation   Hx of bradycardia with Medtronic pacemaker insertion and HTN withrecent uptitration of BB and CCB for HTn and rate control  Plan 1) dofetilide 500 mcg bid 2) Continue apixoban BB and CCB 3) DCCV if not converted her on her own by Wednesday  Reviewed issues with dofetilide

## 2019-03-22 NOTE — Progress Notes (Addendum)
Pharmacy Review for Dofetilide (Tikosyn) Initiation  Admit Complaint: 85 y.o. female admitted 03/22/2019 with atrial fibrillation to be initiated on dofetilide.   Assessment:  Patient Exclusion Criteria: If any screening criteria checked as "Yes", then  patient  should NOT receive dofetilide until criteria item is corrected. If "Yes" please indicate correction plan.  YES  NO Patient  Exclusion Criteria Correction Plan  []  []  Baseline QTc interval is greater than or equal to 440 msec. IF above YES box checked dofetilide contraindicated unless patient has ICD; then may proceed if QTc 500-550 msec or with known ventricular conduction abnormalities may proceed with QTc 550-600 msec. QTc =  487 Okay to begin Tikosyn per EP  []  [x]  Magnesium level is less than 1.8 mEq/l : Last magnesium:  Lab Results  Component Value Date   MG 2.0 03/22/2019         []  [x]  Potassium level is less than 4 mEq/l : Last potassium:  Lab Results  Component Value Date   K 4.7 03/22/2019         []  [x]  Patient is known or suspected to have a digoxin level greater than 2 ng/ml: No results found for: DIGOXIN    []  [x]  Creatinine clearance less than 20 ml/min (calculated using Cockcroft-Gault, actual body weight and serum creatinine): Estimated Creatinine Clearance: 64.4 mL/min (by C-G formula based on SCr of 0.64 mg/dL).    []  [x]  Patient has received drugs known to prolong the QT intervals within the last 48 hours (phenothiazines, tricyclics or tetracyclic antidepressants, erythromycin, H-1 antihistamines, cisapride, fluoroquinolones, azithromycin). Drugs not listed above may have an, as yet, undetected potential to prolong the QT interval, updated information on QT prolonging agents is available at this website:QT prolonging agents   []  [x]  Patient received a dose of hydrochlorothiazide (Oretic) alone or in any combination including triamterene (Dyazide, Maxzide) in the last 48 hours.   []  [x]  Patient received a  medication known to increase dofetilide plasma concentrations prior to initial dofetilide dose:  . Trimethoprim (Primsol, Proloprim) in the last 36 hours . Verapamil (Calan, Verelan) in the last 36 hours or a sustained release dose in the last 72 hours . Megestrol (Megace) in the last 5 days  . Cimetidine (Tagamet) in the last 6 hours . Ketoconazole (Nizoral) in the last 24 hours . Itraconazole (Sporanox) in the last 48 hours  . Prochlorperazine (Compazine) in the last 36 hours    []  [x]  Patient is known to have a history of torsades de pointes; congenital or acquired long QT syndromes.   []  [x]  Patient has received a Class 1 antiarrhythmic with less than 2 half-lives since last dose. (Disopyramide, Quinidine, Procainamide, Lidocaine, Mexiletine, Flecainide, Propafenone)   []  []  Patient has received amiodarone therapy in the past 3 months or amiodarone level is greater than 0.3 ng/ml.    Patient has been appropriately anticoagulated with apixaban.  Goal of Therapy: Follow renal function, electrolytes, potential drug interactions, and dose adjustment. Provide education and 1 week supply at discharge.  Plan:  [x]   Physician selected initial dose within range recommended for patients level of renal function - will monitor for response.  []   Physician selected initial dose outside of range recommended for patients level of renal function - will discuss if the dose should be altered at this time.   Select One Calculated CrCl  Dose q12h  [x]  > 60 ml/min 500 mcg  []  40-60 ml/min 250 mcg  []  20-40 ml/min 125 mcg   2.  Follow up QTc after the first 5 doses, renal function, electrolytes (K & Mg) daily x 3     days, dose adjustment, success of initiation and facilitate 1 week discharge supply as     clinically indicated.   Hildred Laser, PharmD Clinical Pharmacist **Pharmacist phone directory can now be found on [email or web address] ([email or web address]).  Listed under [place].

## 2019-03-22 NOTE — Progress Notes (Signed)
PHARMACIST - PHYSICIAN COMMUNICATION  CONCERNING: P&T Medication Policy Regarding Oral Bisphosphonates  RECOMMENDATION: Your order for alendronate (Fosamax), ibandronate (Boniva), or risedronate (Actonel) has been discontinued at this time.  If the patient's post-hospital medical condition warrants safe use of this class of drugs, please resume the pre-hospital regimen upon discharge.  DESCRIPTION:  Alendronate (Fosamax), ibandronate (Boniva), and risedronate (Actonel) can cause severe esophageal erosions in patients who are unable to remain upright at least 30 minutes after taking this medication.   Since brief interruptions in therapy are thought to have minimal impact on bone mineral density, the [facility] has established that bisphosphonate orders should be routinely discontinued during hospitalization.   To override this safety policy and permit administration of Boniva, Fosamax, or Actonel in the hospital, prescribers must write "DO NOT HOLD" in the comments section when placing the order for this class of medications.  Thank you for allowing pharmacy to be a part of this patient's care.  Alycia Rossetti, PharmD, BCPS Clinical Pharmacist Clinical phone for 03/22/2019: [phone] 03/22/2019 4:59 PM   **Pharmacist phone directory can now be found on [email or web address] (PW [email or web address]).  Listed under [place].

## 2019-03-23 ENCOUNTER — Ambulatory Visit (HOSPITAL_COMMUNITY): Payer: Medicare Other | Admitting: Physician Assistant

## 2019-03-23 LAB — BASIC METABOLIC PANEL
Anion gap: 8 (ref 5–15)
BUN: 11 mg/dL (ref 8–23)
CO2: 24 mmol/L (ref 22–32)
Calcium: 9.6 mg/dL (ref 8.9–10.3)
Chloride: 105 mmol/L (ref 98–111)
Creatinine, Ser: 0.67 mg/dL (ref 0.44–1.00)
GFR calc Af Amer: >60 mL/min (ref >60)
GFR calc non Af Amer: >60 mL/min (ref >60)
Glucose, Bld: 105 mg/dL — ABNORMAL HIGH (ref 70–99)
Potassium: 3.9 mmol/L (ref 3.5–5.1)
Sodium: 137 mmol/L (ref 135–145)

## 2019-03-23 LAB — MAGNESIUM: Magnesium: 1.9 mg/dL (ref 1.7–2.4)

## 2019-03-23 MED ORDER — SODIUM CHLORIDE 0.9% FLUSH: 3.0000 mL | INTRAVENOUS | Status: DC | PRN

## 2019-03-23 MED ORDER — SODIUM CHLORIDE 0.9 % IV SOLN: 250.0000 mL | INTRAVENOUS | Status: DC

## 2019-03-23 MED ORDER — SODIUM CHLORIDE 0.9% FLUSH
3.0000 mL | Freq: Two times a day (BID) | INTRAVENOUS | Status: DC
  Administered 2019-03-23 (×2): 3 mL via INTRAVENOUS

## 2019-03-23 MED ORDER — POTASSIUM CHLORIDE CRYS ER 20 MEQ PO TBCR
40.0000 meq | EXTENDED_RELEASE_TABLET | Freq: Once | ORAL | Status: AC
  Administered 2019-03-23: 40 meq via ORAL
  Filled 2019-03-23: qty 2

## 2019-03-23 MED ORDER — MAGNESIUM SULFATE 2 GM/50ML IV SOLN
2.0000 g | Freq: Once | INTRAVENOUS | Status: AC
  Administered 2019-03-23: 2 g via INTRAVENOUS
  Filled 2019-03-23: qty 50

## 2019-03-23 NOTE — TOC Initial Note (Signed)
Transition of Care [facility]) - Initial/Assessment Note    Patient Details  Name: Brittany [name] MRN: [id number] Date of Birth: July 09, 1934  Transition of [facility] CM/SW Contact:    Bethena Roys, RN Phone Number: 03/23/2019, 2:55 PM  Clinical Narrative: Pt presented for Persistent Atrial Fib. Plan for Tikosyn initiation. Patient is without Part D Rx Coverage- pays out of pocket for medication or gets medications from [place] via mail. Pt was provided Good Rx card- states she uses [employer] and will shop around for cheapest price. CM did call [facility] and price for 500 mcg tablet bid is $72.99 for member and $80.87 for non-member. Patient will need a Rx for 7 day supply no refill sent to the [place] and the original Rx for 30 day supply with refills as a paper Rx. No further needs from CM @ this time.   Expected Discharge Plan: Home/Self Care Barriers to Discharge: No Barriers Identified   Patient Goals and CMS Choice Patient states their goals for this hospitalization and ongoing recovery are:: "to return home"   Choice offered to / list presented to : NA  Expected Discharge Plan and Services Expected Discharge Plan: Home/Self Care In-house Referral: NA Discharge Planning Services: CM Consult, Medication Assistance Post Acute Care Choice: NA Living arrangements for the past 2 months: Apartment                    HH Arranged: NA    Prior Living Arrangements/Services Living arrangements for the past 2 months: Apartment Lives with:: Spouse Patient language and need for interpreter reviewed:: No Do you feel safe going back to the place where you live?: Yes      Need for Family Participation in Patient Care: Yes (Comment) Care giver support system in place?: Yes (comment)   Criminal Activity/Legal Involvement Pertinent to Current Situation/Hospitalization: No - Comment as needed  Activities of Daily Living Home Assistive Devices/Equipment: Walker (specify  type) ADL Screening (condition at time of admission) Patient's cognitive ability adequate to safely complete daily activities?: Yes Is the patient deaf or have difficulty hearing?: No Does the patient have difficulty seeing, even when wearing glasses/contacts?: Yes Does the patient have difficulty concentrating, remembering, or making decisions?: No Patient able to express need for assistance with ADLs?: Yes Does the patient have difficulty dressing or bathing?: No Independently performs ADLs?: Yes (appropriate for developmental age) Does the patient have difficulty walking or climbing stairs?: Yes Weakness of Legs: Both Weakness of Arms/Hands: None  Permission Sought/Granted Permission sought to share information with : Family Supports                Emotional Assessment Appearance:: Appears stated age Attitude/Demeanor/Rapport: Engaged Affect (typically observed): Accepting Orientation: : Oriented to Self, Oriented to Place, Oriented to  Time, Oriented to Situation Alcohol / Substance Use: Not Applicable Psych Involvement: No (comment)  Admission diagnosis:  A Fib Patient Active Problem List   Diagnosis Date Noted  . Persistent atrial fibrillation 03/22/2019  . A-fib ([place]) 03/22/2019  . Stroke (cerebrum) ([place]) 12/30/2017  . Dizziness 06/06/2017  . Overactive bladder 06/06/2017  . Abnormal mammogram 05/01/2017  . Hyperparathyroidism ([place]) 02/12/2017  . Vitamin D deficiency 08/14/2016  . Osteoporosis 08/14/2016  . Hyperlipidemia 08/14/2016  . Atherosclerosis of coronary artery 01/02/2016  . Hypercalcemia 03/29/2015  . Multiple pulmonary nodules 12/30/2013  . Lumbar spinal stenosis 12/22/2013  . Arthropathy of facet joint 10/08/2013  . Atrial fibrillation ([place]) 03/04/2013  . Sinus node  dysfunction ([place]) 03/04/2013  . Pacemaker-Medtronic 03/04/2013  . OSA on CPAP 02/23/2013  . Anxiety and depression 02/23/2013  . Chronic gastritis 02/23/2013  . Essential  hypertension, benign 02/23/2013  . Urinary incontinence 02/23/2013   PCP:  Donald Prose, MD Pharmacy:   [facility] # [id number] [place] [address] [place] [address] Phone: [phone] Fax: [phone]     Social Determinants of Health (SDOH) Interventions    Readmission Risk Interventions No flowsheet data found.

## 2019-03-23 NOTE — Progress Notes (Signed)
  Reviewed ECG from 9/1 1135 am.   Shows Afib with V pacing at 72 bpm.  QTc stable ~ 440.  Continue current dose tikosyn 500 mcg BID.   Shirley Friar, PA-C  Pager: [phone]  03/23/2019 12:19 PM

## 2019-03-23 NOTE — Progress Notes (Addendum)
Electrophysiology Rounding Note  Patient Name: Brittany [name] Date of Encounter: 03/23/2019  PCP: Donald Prose, MD  Electrophysiologist: Dr. Caryl Comes  Subjective   The patient is doing well today.  At this time, the patient denies chest pain, shortness of breath, or any new concerns.   Remains in Afib.  QTc 455 ms Brittany [name])  Inpatient Medications    Scheduled Meds: . apixaban  5 mg Oral BID  . atorvastatin  40 mg Oral q1800  . B-complex with vitamin C  1 tablet Oral Daily  . cholecalciferol  5,000 Units Oral Daily  . diltiazem  120 mg Oral BID  . dofetilide  500 mcg Oral BID  . lisinopril  20 mg Oral Daily  . magnesium oxide  400 mg Oral Daily  . metoprolol succinate  100 mg Oral Daily  . metoprolol succinate  50 mg Oral q1800  . multivitamin with minerals  1 tablet Oral Daily  . omega-3 acid ethyl esters  1 g Oral Daily  . sertraline  50 mg Oral Daily  . sodium chloride flush  3 mL Intravenous Q12H   Continuous Infusions: . sodium chloride     PRN Meds: sodium chloride, acetaminophen, sodium chloride flush   Vital Signs    Vitals:   03/22/19 2218 03/22/19 2310 03/23/19 0435 03/23/19 0443  BP: (!) 181/98 (!) 156/94 132/83   Pulse:      Resp:   18   Temp:   98.4 F (36.9 C)   TempSrc:   Oral   SpO2:   96%   Weight:    91.2 kg  Height:        Intake/Output Summary (Last 24 hours) at 03/23/2019 0708 Last data filed at 03/23/2019 0447 Gross per 24 hour  Intake 240 ml  Output 570 ml  Net -330 ml   Filed Weights   03/22/19 1218 03/23/19 0443  Weight: 92.4 kg 91.2 kg    Physical Exam    GEN- The patient is well appearing, alert and oriented x 3 today.   Head- normocephalic, atraumatic Eyes-  Sclera clear, conjunctiva pink Ears- hearing intact Oropharynx- clear Neck- supple Lungs- Clear to ausculation bilaterally, normal work of breathing Heart- Irregular rate and rhythm, no murmurs, rubs or gallops GI- soft, NT, ND, + BS Extremities- no clubbing,  cyanosis, or edema Skin- no rash or lesion Psych- euthymic mood, full affect Neuro- strength and sensation are intact  Labs    CBC No results for input(s): WBC, NEUTROABS, HGB, HCT, MCV, PLT in the last 72 hours. Basic Metabolic Panel Recent Labs    03/22/19 1100 03/23/19 0422  NA 137 137  K 4.7 3.9  CL 104 105  CO2 24 24  GLUCOSE 103* 105*  BUN 10 11  CREATININE 0.64 0.67  CALCIUM 10.1 9.6  MG 2.0 1.9   Liver Function Tests No results for input(s): AST, ALT, ALKPHOS, BILITOT, PROT, ALBUMIN in the last 72 hours. No results for input(s): LIPASE, AMYLASE in the last 72 hours. Cardiac Enzymes No results for input(s): CKTOTAL, CKMB, CKMBINDEX, TROPONINI in the last 72 hours. BNP Invalid input(s): POCBNP D-Dimer No results for input(s): DDIMER in the last 72 hours. Hemoglobin A1C No results for input(s): HGBA1C in the last 72 hours. Fasting Lipid Panel No results for input(s): CHOL, HDL, LDLCALC, TRIG, CHOLHDL, LDLDIRECT in the last 72 hours. Thyroid Function Tests No results for input(s): TSH, T4TOTAL, T3FREE, THYROIDAB in the last 72 hours.  Invalid input(s): [employer]  Telemetry  Afib 80-90s (personally reviewed)  Radiology    No results found.   Patient Profile     Brittany [name] Plan is a 85 y.o. female with a past medical history significant for persistent atrial fibrillation. She was admitted for Tikosyn load.   Assessment & Plan    1. Persistent Afib Remains in Afib with controlled RVR Continue tikosyn 500 mg BId Follow Electrolytes. K 3.9 and Mg 1.9 today. Supp gently.  Continue cardizem and metoprolol. Watch dosing for bradycardia if converts to SR Continue Eliquis 5 mg BID Plan for DCCV tomorrow if does not convert on tikosyn.   For questions or updates, please contact [place] Please consult [email or web address] for contact info under Cardiology/STEMI.  Signed, Shirley Friar, PA-C  03/23/2019, 7:08 AM    Pt seen and examined  Still in  AFib anticipate DCCV tomorrow if not converted ( this is a late entry and the patient did convert on drug)

## 2019-03-23 NOTE — TOC Benefit Eligibility Note (Signed)
Transition of Care [facility]) Benefit Eligibility Note    Patient Details  Name: Ginelle Hamar MRN: [id number] Date of Birth: [birth year]   Medication/Dose: Phyllis Ginger  500 MCG CAPSULE    OR   DOFETILIDE 500 MCG  CAPSULE  BID        Prescription Coverage Preferred Pharmacy: [place] with Person/Company/Phone Number:: JESSICA @ [employer] / [employer]  [phone] # [phone]        Deductible: (NO DEDUCTIBLE WITH DISCOUNT)  Additional Notes: PATIENT HAS A DISCOUNT CARD ONLY    Memory Argue Phone Number: 03/23/2019, 11:05 AM

## 2019-03-23 NOTE — Progress Notes (Signed)
Pharmacy: Dofetilide (Tikosyn) - Follow Up Assessment and Electrolyte Replacement  Pharmacy consulted to assist in monitoring and replacing electrolytes in this 85 y.o. female admitted on 03/22/2019 undergoing dofetilide initiation. First dofetilide dose: 03/22/19 at 8pm  Labs:    Component Value Date/Time   K 3.9 03/23/2019 0422   K 3.9 10/28/2012 1246   MG 1.9 03/23/2019 0422     Plan: Potassium: K 3.8-3.9:  Give KCl 40 mEq po x1   Magnesium: Mg 1.8-2: Give Mg 2 gm IV x1    Thank you for allowing pharmacy to participate in this patient's care   Hildred Laser, PharmD Clinical Pharmacist **Pharmacist phone directory can now be found on [email or web address] (PW [email or web address]).  Listed under [place].

## 2019-03-24 ENCOUNTER — Encounter (HOSPITAL_COMMUNITY)
Admission: EM | Disposition: A | Discharge status: Home / Self Care | Payer: Self-pay | Source: Ambulatory Visit | Attending: Internal Medicine

## 2019-03-24 LAB — BASIC METABOLIC PANEL
Anion gap: 7 (ref 5–15)
BUN: 21 mg/dL (ref 8–23)
CO2: 24 mmol/L (ref 22–32)
Calcium: 9.9 mg/dL (ref 8.9–10.3)
Chloride: 107 mmol/L (ref 98–111)
Creatinine, Ser: 0.79 mg/dL (ref 0.44–1.00)
GFR calc Af Amer: >60 mL/min (ref >60)
GFR calc non Af Amer: >60 mL/min (ref >60)
Glucose, Bld: 108 mg/dL — ABNORMAL HIGH (ref 70–99)
Potassium: 4.3 mmol/L (ref 3.5–5.1)
Sodium: 138 mmol/L (ref 135–145)

## 2019-03-24 LAB — MAGNESIUM: Magnesium: 2.1 mg/dL (ref 1.7–2.4)

## 2019-03-24 SURGERY — CARDIOVERSION
Anesthesia: General

## 2019-03-24 NOTE — Progress Notes (Addendum)
Electrophysiology Rounding Note  Patient Name: Brittany [name] Date of Encounter: 03/24/2019  PCP: Donald Prose, MD  Electrophysiologist: Dr. Caryl Comes   Subjective   The patient is doing well today.  At this time, the patient denies chest pain, shortness of breath, or any new concerns.  Converted to NSR on tikosyn ~ 1230 pm 03/23/2019.  EKG from 9/1 pm shows A-pacing at 66 bpm with QTc ~440  Inpatient Medications    Scheduled Meds: . apixaban  5 mg Oral BID  . atorvastatin  40 mg Oral q1800  . B-complex with vitamin C  1 tablet Oral Daily  . cholecalciferol  5,000 Units Oral Daily  . diltiazem  120 mg Oral BID  . dofetilide  500 mcg Oral BID  . lisinopril  20 mg Oral Daily  . magnesium oxide  400 mg Oral Daily  . metoprolol succinate  100 mg Oral Daily  . metoprolol succinate  50 mg Oral q1800  . multivitamin with minerals  1 tablet Oral Daily  . omega-3 acid ethyl esters  1 g Oral Daily  . sertraline  50 mg Oral Daily  . sodium chloride flush  3 mL Intravenous Q12H  . sodium chloride flush  3 mL Intravenous Q12H   Continuous Infusions: . sodium chloride    . sodium chloride     PRN Meds: sodium chloride, acetaminophen, sodium chloride flush, sodium chloride flush   Vital Signs    Vitals:   03/23/19 2259 03/23/19 2301 03/24/19 0338 03/24/19 0342  BP: (!) 164/89 (!) 152/83  (!) 154/66  Pulse:    67  Resp:      Temp:    97.6 F (36.4 C)  TempSrc:    Oral  SpO2:    100%  Weight:   91.7 kg   Height:        Intake/Output Summary (Last 24 hours) at 03/24/2019 0734 Last data filed at 03/23/2019 2307 Gross per 24 hour  Intake 160 ml  Output 200 ml  Net -40 ml   Filed Weights   03/22/19 1218 03/23/19 0443 03/24/19 0338  Weight: 92.4 kg 91.2 kg 91.7 kg    Physical Exam    GEN- The patient is well appearing, alert and oriented x 3 today.   Head- normocephalic, atraumatic Eyes-  Sclera clear, conjunctiva pink Ears- hearing intact Oropharynx- clear Neck-  supple Lungs- Clear to ausculation bilaterally, normal work of breathing Heart- Regular rate and rhythm, no murmurs, rubs or gallops GI- soft, NT, ND, + BS Extremities- no clubbing, cyanosis, or edema Skin- no rash or lesion Psych- euthymic mood, full affect Neuro- strength and sensation are intact  Labs    CBC No results for input(s): WBC, NEUTROABS, HGB, HCT, MCV, PLT in the last 72 hours. Basic Metabolic Panel Recent Labs    03/23/19 0422 03/24/19 0332  NA 137 138  K 3.9 4.3  CL 105 107  CO2 24 24  GLUCOSE 105* 108*  BUN 11 21  CREATININE 0.67 0.79  CALCIUM 9.6 9.9  MG 1.9 2.1   Liver Function Tests No results for input(s): AST, ALT, ALKPHOS, BILITOT, PROT, ALBUMIN in the last 72 hours. No results for input(s): LIPASE, AMYLASE in the last 72 hours. Cardiac Enzymes No results for input(s): CKTOTAL, CKMB, CKMBINDEX, TROPONINI in the last 72 hours. BNP Invalid input(s): POCBNP D-Dimer No results for input(s): DDIMER in the last 72 hours. Hemoglobin A1C No results for input(s): HGBA1C in the last 72 hours. Fasting Lipid  Panel No results for input(s): CHOL, HDL, LDLCALC, TRIG, CHOLHDL, LDLDIRECT in the last 72 hours. Thyroid Function Tests No results for input(s): TSH, T4TOTAL, T3FREE, THYROIDAB in the last 72 hours.  Invalid input(s): [employer]  Telemetry    A paced (NSR) 60-70s (personally reviewed)  Radiology    No results found.   Patient Profile     Brittany [name] Plan is a 85 y.o. female with a past medical history significant for persistent Afib. She was admitted for tikosyn load.   Assessment & Plan    1. Persistent Afib Now in NSR/A pacing QTc stable. Continue current tikosyn dosing.  No need for DCCV today.  Continue Eliquis 5 mg BID Continue cardizen and metoprolol.   For questions or updates, please contact [place] Please consult [email or web address] for contact info under Cardiology/STEMI.  Signed, Shirley Friar, PA-C  03/24/2019,  7:34 AM    Pt not seen but ECG reviewed

## 2019-03-24 NOTE — Progress Notes (Signed)
  EKG 03/24/2019 1044 shows A-paced 69 bpm with stable QTc.  Continue tikosyn 500 mcg BID  Annamaria Helling  Pager: [phone]  03/24/2019 12:03 PM

## 2019-03-24 NOTE — Progress Notes (Signed)
Pharmacy: Dofetilide (Tikosyn) - Follow Up Assessment and Electrolyte Replacement  Pharmacy consulted to assist in monitoring and replacing electrolytes in this 85 y.o. female admitted on 03/22/2019 undergoing dofetilide initiation.  Labs:    Component Value Date/Time   K 4.3 03/24/2019 0332   K 3.9 10/28/2012 1246   MG 2.1 03/24/2019 [id number]     Plan: Potassium: K >/= 4: No additional supplementation needed  Magnesium: Mg > 2: No additional supplementation needed   Thank you for allowing pharmacy to participate in this patient's care   Hildred Laser, PharmD Clinical Pharmacist **Pharmacist phone directory can now be found on [email or web address] (PW [email or web address]).  Listed under [place].

## 2019-03-24 NOTE — Discharge Summary (Addendum)
ELECTROPHYSIOLOGY PROCEDURE DISCHARGE SUMMARY    Patient ID: Brittany [name],  MRN: [id number], DOB/AGE: 11-25-1933 85 y.o.  Admit date: 03/22/2019 Discharge date: 03/25/2019  Primary Care Physician: Donald Prose, MD  Primary Cardiologist: No primary care provider on file.  Electrophysiologist: Dr. Caryl Comes  Primary Discharge Diagnosis:  1.  Persistent atrial fibrillation status post Tikosyn loading this admission  Allergies  Allergen Reactions  . Adhesive [Tape] Swelling and Rash  . Benzalkonium Chloride Rash    Pt was not aware of this allergy  . Neosporin [Neomycin-Polymyxin-Gramicidin] Swelling and Rash   Procedures This Admission:  1.  Tikosyn loading  Brief HPI: Brittany [name] is a 85 y.o. female with a past medical history as noted above.  They were referred to EP in the outpatient setting for treatment options of atrial fibrillation.  Risks, benefits, and alternatives to Tikosyn were reviewed with the patient who wished to proceed.    Hospital Course:  The patient was admitted and Tikosyn was initiated.  Renal function and electrolytes were followed during the hospitalization.  Their QTc remained stable.  No direct current cardioversion due to chemical conversion on Tikosn. They were monitored until discharge on telemetry which demonstrated conversion to A-paced rhythm 03/23/2019 and maintained for the duration of admission. On the day of discharge, they were examined by Dr Caryl Comes who considered them stable for discharge to home.  Final EKG prior to leaving showed A-paced rhythm at 70 bpm with personally calculated QTc of ~ 450 cc (EKG auto-calculated lower with low amplitude t waves). Follow-up has been arranged with Afib clinic in 1 week and plan to see Dr Caryl Comes in 4 weeks.   Physical Exam: Vitals:   03/24/19 0338 03/24/19 0342 03/25/19 0433 03/25/19 0953  BP:  (!) 154/66 (!) 159/74 (!) 179/65  Pulse:  67 65 64  Resp:      Temp:  97.6 F (36.4 C) 98 F (36.7 C)    TempSrc:  Oral Oral   SpO2:  100% 96%   Weight: 91.7 kg  91.3 kg   Height:        GEN- The patient is well appearing, alert and oriented x 3 today.   HEENT: normocephalic, atraumatic; sclera clear, conjunctiva pink; hearing intact; oropharynx clear; neck supple, no JVP Lymph- no cervical lymphadenopathy Lungs- Clear to ausculation bilaterally, normal work of breathing.  No wheezes, rales, rhonchi Heart- Regular rate and rhythm, no murmurs, rubs or gallops, PMI not laterally displaced GI- soft, non-tender, non-distended, bowel sounds present, no hepatosplenomegaly Extremities- no clubbing, cyanosis, or edema; DP/PT/radial pulses 2+ bilaterally MS- no significant deformity or atrophy Skin- warm and dry, no rash or lesion Psych- euthymic mood, full affect Neuro- strength and sensation are intact   Labs:   Lab Results  Component Value Date   WBC 4.8 09/22/2018   HGB 12.9 09/22/2018   HCT 39.7 09/22/2018   MCV 97.5 09/22/2018   PLT 255 09/22/2018    Recent Labs  Lab 03/25/19 0420  NA 136  K 4.3  CL 103  CO2 25  BUN 14  CREATININE 0.60  CALCIUM 9.8  GLUCOSE 104*     Discharge Medications:  Allergies as of 03/25/2019      Reactions   Adhesive [tape] Swelling, Rash   Benzalkonium Chloride Rash   Pt was not aware of this allergy   Neosporin [neomycin-polymyxin-gramicidin] Swelling, Rash      Medication List    TAKE these medications   acetaminophen  500 MG tablet Commonly known as: TYLENOL Take 100 mg by mouth 2 (two) times daily.   alendronate 70 MG tablet Commonly known as: FOSAMAX Take 1 tablet (70 mg total) by mouth every 7 (seven) days. Take with a full glass of water on an empty stomach. What changed: when to take this   apixaban 5 MG Tabs tablet Commonly known as: ELIQUIS Take 1 tablet (5 mg total) by mouth 2 (two) times daily. What changed: when to take this   atorvastatin 40 MG tablet Commonly known as: LIPITOR Take 1 tablet (40 mg total) by mouth  daily at 6 PM.   B-complex with vitamin C tablet Take 1 tablet by mouth 2 (two) times daily with breakfast and lunch.   desmopressin 0.2 MG tablet Commonly known as: DDAVP Take 0.2 mg by mouth daily as needed (to control bladder on car trip).   diltiazem 120 MG 24 hr capsule Commonly known as: CARDIZEM CD Take 1 capsule (120 mg total) by mouth 2 (two) times daily. What changed: when to take this   dofetilide 500 MCG capsule Commonly known as: TIKOSYN Take 1 capsule (500 mcg total) by mouth 2 (two) times daily.   dofetilide 500 MCG capsule Commonly known as: Tikosyn Take 1 capsule (500 mcg total) by mouth 2 (two) times daily.   Fish Oil 1200 MG Caps Take 1,200 mg by mouth 2 (two) times daily with a meal.   lisinopril 20 MG tablet Commonly known as: ZESTRIL Take 1 tablet (20 mg total) by mouth daily. What changed: when to take this   Lutein 20 MG Tabs Take 20 mg by mouth daily with breakfast.   magnesium oxide 400 MG tablet Commonly known as: MAG-OX Take 1 tablet (400 mg total) by mouth daily. What changed: when to take this   metoprolol succinate 50 MG 24 hr tablet Commonly known as: TOPROL-XL Take 1 tablet (50 mg total) by mouth daily. Take 2 tablets (100mg ) in the AM, Take 1 tablet (50mg ) in the PM What changed:   how much to take  when to take this  additional instructions   multivitamin with minerals Tabs tablet Take 1 tablet by mouth daily with breakfast.   PRESCRIPTION MEDICATION Inhale into the lungs at bedtime. CPAP   PRESERVISION AREDS 2 PO Take 1 capsule by mouth 2 (two) times daily with a meal.   sertraline 50 MG tablet Commonly known as: Zoloft Take 1 tablet (50 mg total) by mouth daily. What changed: when to take this   Vitamin D3 125 MCG (5000 UT) Caps Take 5,000 Units by mouth daily with breakfast.       Disposition:   Follow-up Information    [facility] Follow up on 04/02/2019.   Specialty: Cardiology  Why: at 1100 for post tikosyn follow up Contact information: [address] [id number] [place]       Deboraha Sprang, MD Follow up on 04/30/2019.   Specialty: Cardiology Why: at 1015 for 1 month tikosyn follow up Contact information: [id number] [place] [address] [phone]           Duration of Discharge Encounter: Greater than 30 minutes including physician time.  Jacalyn Lefevre, PA-C  03/25/2019 11:41 AM  Pt examined and results reviewed  [place] for discharge

## 2019-03-25 LAB — BASIC METABOLIC PANEL
Anion gap: 8 (ref 5–15)
BUN: 14 mg/dL (ref 8–23)
CO2: 25 mmol/L (ref 22–32)
Calcium: 9.8 mg/dL (ref 8.9–10.3)
Chloride: 103 mmol/L (ref 98–111)
Creatinine, Ser: 0.6 mg/dL (ref 0.44–1.00)
GFR calc Af Amer: >60 mL/min (ref >60)
GFR calc non Af Amer: >60 mL/min (ref >60)
Glucose, Bld: 104 mg/dL — ABNORMAL HIGH (ref 70–99)
Potassium: 4.3 mmol/L (ref 3.5–5.1)
Sodium: 136 mmol/L (ref 135–145)

## 2019-03-25 LAB — MAGNESIUM: Magnesium: 1.9 mg/dL (ref 1.7–2.4)

## 2019-03-25 MED ORDER — MAGNESIUM SULFATE 2 GM/50ML IV SOLN
2.0000 g | Freq: Once | INTRAVENOUS | Status: AC
  Administered 2019-03-25: 2 g via INTRAVENOUS
  Filled 2019-03-25: qty 50

## 2019-03-25 MED ORDER — DOFETILIDE 500 MCG PO CAPS: 500.0000 ug | ORAL_CAPSULE | Freq: Two times a day (BID) | ORAL | 0 refills | Status: DC

## 2019-03-25 MED ORDER — DOFETILIDE 500 MCG PO CAPS: 500.0000 ug | ORAL_CAPSULE | Freq: Two times a day (BID) | ORAL | 6 refills | Status: DC

## 2019-03-25 MED FILL — TIKOSYN 500 MCG CAPS: 500 | 7 days supply | Qty: 14 | Fill #0

## 2019-03-25 NOTE — Discharge Instructions (Signed)
Dofetilide capsules What is this medicine? DOFETILIDE (doe FET il ide) is an antiarrhythmic drug. It helps make your heart beat regularly. This medicine also helps to slow rapid heartbeats. This medicine may be used for other purposes; ask your health care provider or pharmacist if you have questions. COMMON BRAND NAME(S): Tikosyn What should I tell my health care provider before I take this medicine? They need to know if you have any of these conditions:  heart disease  history of irregular heartbeat  history of low levels of potassium or magnesium in the blood  kidney disease  liver disease  an unusual or allergic reaction to dofetilide, other medicines, foods, dyes, or preservatives  pregnant or trying to get pregnant  breast-feeding How should I use this medicine? Take this medicine by mouth with a glass of water. Follow the directions on the prescription label. Do not take with grapefruit juice. You can take it with or without food. If it upsets your stomach, take it with food. Take your medicine at regular intervals. Do not take it more often than directed. Do not stop taking except on your doctor's advice. A special MedGuide will be given to you by the pharmacist with each prescription and refill. Be sure to read this information carefully each time. Talk to your pediatrician regarding the use of this medicine in children. Special care may be needed. Overdosage: If you think you have taken too much of this medicine contact a poison control center or emergency room at once. NOTE: This medicine is only for you. Do not share this medicine with others. What if I miss a dose? If you miss a dose, skip it. Take your next dose at the normal time. Do not take extra or 2 doses at the same time to make up for the missed dose. What may interact with this medicine? Do not take this medicine with any of the following  medications:  cimetidine  cisapride  dolutegravir  dronedarone  hydrochlorothiazide  ketoconazole  megestrol  pimozide  prochlorperazine  thioridazine  trimethoprim  verapamil This medicine may also interact with the following medications:  amiloride  cannabinoids  certain antibiotics like erythromycin or clarithromycin  certain antiviral medicines for HIV or hepatitis  certain medicines for depression, anxiety, or psychotic disorders  digoxin  diltiazem  grapefruit juice  metformin  nefazodone  other medicines that prolong the QT interval (an abnormal heart rhythm)  quinine  triamterene  zafirlukast  ziprasidone This list may not describe all possible interactions. Give your health care provider a list of all the medicines, herbs, non-prescription drugs, or dietary supplements you use. Also tell them if you smoke, drink alcohol, or use illegal drugs. Some items may interact with your medicine. What should I watch for while using this medicine? Your condition will be monitored carefully while you are receiving this medicine. What side effects may I notice from receiving this medicine? Side effects that you should report to your doctor or health care professional as soon as possible:  allergic reactions like skin rash, itching or hives, swelling of the face, lips, or tongue  breathing problems  chest pain or chest tightness  dizziness  signs and symptoms of a dangerous change in heartbeat or heart rhythm like chest pain; dizziness; fast or irregular heartbeat; palpitations; feeling faint or lightheaded, falls; breathing problems  signs and symptoms of electrolyte imbalance like severe diarrhea, unusual sweating, vomiting, loss of appetite, increased thirst  swelling of the ankles, legs, or feet  tingling,  numbness in the hands or feet Side effects that usually do not require medical attention (report to your doctor or health care  professional if they continue or are bothersome):  diarrhea  general ill feeling or flu-like symptoms  headache  nausea  trouble sleeping  stomach pain This list may not describe all possible side effects. Call your doctor for medical advice about side effects. You may report side effects to FDA at 1-800-FDA-1088. Where should I keep my medicine? Keep out of the reach of children. Store at room temperature between 15 and 30 degrees C (59 and 86 degrees F). Throw away any unused medicine after the expiration date. NOTE: This sheet is a summary. It may not cover all possible information. If you have questions about this medicine, talk to your doctor, pharmacist, or health care provider.  2020 Elsevier/Gold Standard (2018-06-29 10:18:48)    Heart-Healthy Eating Plan Heart-healthy meal planning includes:  Eating less unhealthy fats.  Eating more healthy fats.  Making other changes in your diet. Talk with your doctor or a diet specialist (dietitian) to create an eating plan that is right for you. What is my plan? Your doctor may recommend an eating plan that includes:  Total fat: ______% or less of total calories a day.  Saturated fat: ______% or less of total calories a day.  Cholesterol: less than _________mg a day. What are tips for following this plan? Cooking Avoid frying your food. Try to bake, boil, grill, or broil it instead. You can also reduce fat by:  Removing the skin from poultry.  Removing all visible fats from meats.  Steaming vegetables in water or broth. Meal planning   At meals, divide your plate into four equal parts: ? Fill one-half of your plate with vegetables and green salads. ? Fill one-fourth of your plate with whole grains. ? Fill one-fourth of your plate with lean protein foods.  Eat 4-5 servings of vegetables per day. A serving of vegetables is: ? 1 cup of raw or cooked vegetables. ? 2 cups of raw leafy greens.  Eat 4-5 servings of  fruit per day. A serving of fruit is: ? 1 medium whole fruit. ?  cup of dried fruit. ?  cup of fresh, frozen, or canned fruit. ?  cup of 100% fruit juice.  Eat more foods that have soluble fiber. These are apples, broccoli, carrots, beans, peas, and barley. Try to get 20-30 g of fiber per day.  Eat 4-5 servings of nuts, legumes, and seeds per week: ? 1 serving of dried beans or legumes equals  cup after being cooked. ? 1 serving of nuts is  cup. ? 1 serving of seeds equals 1 tablespoon. General information  Eat more home-cooked food. Eat less restaurant, buffet, and fast food.  Limit or avoid alcohol.  Limit foods that are high in starch and sugar.  Avoid fried foods.  Lose weight if you are overweight.  Keep track of how much salt (sodium) you eat. This is important if you have high blood pressure. Ask your doctor to tell you more about this.  Try to add vegetarian meals each week. Fats  Choose healthy fats. These include olive oil and canola oil, flaxseeds, walnuts, almonds, and seeds.  Eat more omega-3 fats. These include salmon, mackerel, sardines, tuna, flaxseed oil, and ground flaxseeds. Try to eat fish at least 2 times each week.  Check food labels. Avoid foods with trans fats or high amounts of saturated fat.  Limit saturated fats. ?  These are often found in animal products, such as meats, butter, and cream. ? These are also found in plant foods, such as palm oil, palm kernel oil, and coconut oil.  Avoid foods with partially hydrogenated oils in them. These have trans fats. Examples are stick margarine, some tub margarines, cookies, crackers, and other baked goods. What foods can I eat? Fruits All fresh, canned (in natural juice), or frozen fruits. Vegetables Fresh or frozen vegetables (raw, steamed, roasted, or grilled). Green salads. Grains Most grains. Choose whole wheat and whole grains most of the time. Rice and pasta, including brown rice and pastas  made with whole wheat. Meats and other proteins Lean, well-trimmed beef, veal, pork, and lamb. Chicken and [place] without skin. All fish and shellfish. Wild duck, rabbit, pheasant, and venison. Egg whites or low-cholesterol egg substitutes. Dried beans, peas, lentils, and tofu. Seeds and most nuts. Dairy Low-fat or nonfat cheeses, including ricotta and mozzarella. Skim or 1% milk that is liquid, powdered, or evaporated. Buttermilk that is made with low-fat milk. Nonfat or low-fat yogurt. Fats and oils Non-hydrogenated (trans-free) margarines. Vegetable oils, including soybean, sesame, sunflower, olive, peanut, safflower, corn, canola, and cottonseed. Salad dressings or mayonnaise made with a vegetable oil. Beverages Mineral water. Coffee and tea. Diet carbonated beverages. Sweets and desserts Sherbet, gelatin, and fruit ice. Small amounts of dark chocolate. Limit all sweets and desserts. Seasonings and condiments All seasonings and condiments. The items listed above may not be a complete list of foods and drinks you can eat. Contact a dietitian for more options. What foods should I avoid? Fruits Canned fruit in heavy syrup. Fruit in cream or butter sauce. Fried fruit. Limit coconut. Vegetables Vegetables cooked in cheese, cream, or butter sauce. Fried vegetables. Grains Breads that are made with saturated or trans fats, oils, or whole milk. Croissants. Sweet rolls. Donuts. High-fat crackers, such as cheese crackers. Meats and other proteins Fatty meats, such as hot dogs, ribs, sausage, bacon, rib-eye roast or steak. High-fat deli meats, such as salami and bologna. Caviar. Domestic duck and goose. Organ meats, such as liver. Dairy Cream, sour cream, cream cheese, and creamed cottage cheese. Whole-milk cheeses. Whole or 2% milk that is liquid, evaporated, or condensed. Whole buttermilk. Cream sauce or high-fat cheese sauce. Yogurt that is made from whole milk. Fats and oils Meat fat, or  shortening. Cocoa butter, hydrogenated oils, palm oil, coconut oil, palm kernel oil. Solid fats and shortenings, including bacon fat, salt pork, lard, and butter. Nondairy cream substitutes. Salad dressings with cheese or sour cream. Beverages Regular sodas and juice drinks with added sugar. Sweets and desserts Frosting. Pudding. Cookies. Cakes. Pies. Milk chocolate or white chocolate. Buttered syrups. Full-fat ice cream or ice cream drinks. The items listed above may not be a complete list of foods and drinks to avoid. Contact a dietitian for more information. Summary  Heart-healthy meal planning includes eating less unhealthy fats, eating more healthy fats, and making other changes in your diet.  Eat a balanced diet. This includes fruits and vegetables, low-fat or nonfat dairy, lean protein, nuts and legumes, whole grains, and heart-healthy oils and fats. This information is not intended to replace advice given to you by your health care provider. Make sure you discuss any questions you have with your health care provider. Document Released: 01/07/2012 Document Revised: 09/11/2017 Document Reviewed: 08/15/2017 Elsevier Patient Education  2020 [employer].

## 2019-03-25 NOTE — Care Management Important Message (Signed)
Important Message  Patient Details  Name: Brittany [name] MRN: [id number] Date of Birth: 1933/12/16   Medicare Important Message Given:  Yes     Shelda Altes 03/25/2019, 11:42 AM

## 2019-03-25 NOTE — Progress Notes (Signed)
Pharmacy: Dofetilide (Tikosyn) - Follow Up Assessment and Electrolyte Replacement  Pharmacy consulted to assist in monitoring and replacing electrolytes in this 85 y.o. female admitted on 03/22/2019 undergoing dofetilide initiation.  Labs:    Component Value Date/Time   K 4.3 03/25/2019 0420   K 3.9 10/28/2012 1246   MG 1.9 03/25/2019 0420     Plan: Potassium: K >/= 4: No additional supplementation needed  Magnesium: Mg 1.8-2: Give Mg 2 gm IV x1   Anticipate no prescription for potassium at discharge  Thank you for allowing pharmacy to participate in this patient's care   Hildred Laser, PharmD Clinical Pharmacist **Pharmacist phone directory can now be found on [email or web address] ([email or web address]).  Listed under [place].

## 2019-03-25 NOTE — Plan of Care (Signed)
  Problem: Education: Goal: Knowledge of General Education information will improve Description: Including pain rating scale, medication(s)/side effects and non-pharmacologic comfort measures Outcome: Adequate for Discharge   Problem: Health Behavior/Discharge Planning: Goal: Ability to manage health-related needs will improve Outcome: Adequate for Discharge   Problem: Education: Goal: Knowledge of disease or condition will improve Outcome: Adequate for Discharge Goal: Understanding of medication regimen will improve Outcome: Adequate for Discharge Goal: Individualized Educational Video(s) Outcome: Adequate for Discharge   Problem: Activity: Goal: Ability to tolerate increased activity will improve Outcome: Adequate for Discharge   Problem: Cardiac: Goal: Ability to achieve and maintain adequate cardiopulmonary perfusion will improve Outcome: Adequate for Discharge   Problem: Health Behavior/Discharge Planning: Goal: Ability to safely manage health-related needs after discharge will improve Outcome: Adequate for Discharge

## 2019-03-30 ENCOUNTER — Other Ambulatory Visit: Payer: Self-pay | Admitting: Family Medicine

## 2019-03-30 DIAGNOSIS — Z1231 Encounter for screening mammogram for malignant neoplasm of breast: Secondary | ICD-10-CM

## 2019-04-01 ENCOUNTER — Telehealth: Payer: Self-pay | Admitting: Internal Medicine

## 2019-04-01 NOTE — Telephone Encounter (Signed)
STAT if HR is under 50 or over 120 (normal HR is 60-100 beats per minute)  1) What is your heart rate? HR102,  BP 170/77  2) Do you have a log of your heart rate readings (document readings)? No, she stated it has been in the 108' and 70's  3) Do you have any other symptoms? She is currently on tikosyn, she does had follow up appt tomorrow with the A-fib clinic, but she wants to know what she should do.

## 2019-04-01 NOTE — Telephone Encounter (Signed)
Pt noticed irregular heart beat yesterday evening. Discussed with Brittany Palau NP will try extra 1/2 tablet of metoprolol now and repeat with her 50mg  dose tonight if still out of rhythm. Will reassess tomorrow.

## 2019-04-02 ENCOUNTER — Ambulatory Visit (HOSPITAL_COMMUNITY)
Admit: 2019-04-02 | Discharge: 2019-04-02 | Disposition: A | Discharge status: Home / Self Care | Payer: Medicare Other | Source: Ambulatory Visit | Attending: Nurse Practitioner | Admitting: Nurse Practitioner

## 2019-04-02 ENCOUNTER — Encounter (HOSPITAL_COMMUNITY): Payer: Self-pay | Admitting: Nurse Practitioner

## 2019-04-02 ENCOUNTER — Other Ambulatory Visit: Payer: Self-pay

## 2019-04-02 VITALS — BP 142/70 | HR 76 | Ht 68.0 in | Wt 199.8 lb

## 2019-04-02 DIAGNOSIS — I739 Peripheral vascular disease, unspecified: Secondary | ICD-10-CM | POA: Insufficient documentation

## 2019-04-02 DIAGNOSIS — M810 Age-related osteoporosis without current pathological fracture: Secondary | ICD-10-CM | POA: Insufficient documentation

## 2019-04-02 DIAGNOSIS — Z825 Family history of asthma and other chronic lower respiratory diseases: Secondary | ICD-10-CM | POA: Insufficient documentation

## 2019-04-02 DIAGNOSIS — Z9841 Cataract extraction status, right eye: Secondary | ICD-10-CM | POA: Diagnosis not present

## 2019-04-02 DIAGNOSIS — Z800 Family history of malignant neoplasm of digestive organs: Secondary | ICD-10-CM | POA: Insufficient documentation

## 2019-04-02 DIAGNOSIS — I10 Essential (primary) hypertension: Secondary | ICD-10-CM | POA: Diagnosis not present

## 2019-04-02 DIAGNOSIS — Z961 Presence of intraocular lens: Secondary | ICD-10-CM | POA: Diagnosis not present

## 2019-04-02 DIAGNOSIS — Z79899 Other long term (current) drug therapy: Secondary | ICD-10-CM | POA: Insufficient documentation

## 2019-04-02 DIAGNOSIS — F329 Major depressive disorder, single episode, unspecified: Secondary | ICD-10-CM | POA: Diagnosis not present

## 2019-04-02 DIAGNOSIS — I4819 Other persistent atrial fibrillation: Secondary | ICD-10-CM | POA: Diagnosis not present

## 2019-04-02 DIAGNOSIS — Z808 Family history of malignant neoplasm of other organs or systems: Secondary | ICD-10-CM | POA: Diagnosis not present

## 2019-04-02 DIAGNOSIS — K219 Gastro-esophageal reflux disease without esophagitis: Secondary | ICD-10-CM | POA: Insufficient documentation

## 2019-04-02 DIAGNOSIS — Z7901 Long term (current) use of anticoagulants: Secondary | ICD-10-CM | POA: Diagnosis not present

## 2019-04-02 DIAGNOSIS — Z86010 Personal history of colonic polyps: Secondary | ICD-10-CM | POA: Insufficient documentation

## 2019-04-02 DIAGNOSIS — Z823 Family history of stroke: Secondary | ICD-10-CM | POA: Diagnosis not present

## 2019-04-02 DIAGNOSIS — I4891 Unspecified atrial fibrillation: Secondary | ICD-10-CM | POA: Diagnosis present

## 2019-04-02 DIAGNOSIS — Z950 Presence of cardiac pacemaker: Secondary | ICD-10-CM | POA: Insufficient documentation

## 2019-04-02 DIAGNOSIS — Z9842 Cataract extraction status, left eye: Secondary | ICD-10-CM | POA: Insufficient documentation

## 2019-04-02 DIAGNOSIS — G4733 Obstructive sleep apnea (adult) (pediatric): Secondary | ICD-10-CM | POA: Insufficient documentation

## 2019-04-02 DIAGNOSIS — Z8249 Family history of ischemic heart disease and other diseases of the circulatory system: Secondary | ICD-10-CM | POA: Insufficient documentation

## 2019-04-02 DIAGNOSIS — Z87891 Personal history of nicotine dependence: Secondary | ICD-10-CM | POA: Insufficient documentation

## 2019-04-02 DIAGNOSIS — Z7983 Long term (current) use of bisphosphonates: Secondary | ICD-10-CM | POA: Insufficient documentation

## 2019-04-02 DIAGNOSIS — E039 Hypothyroidism, unspecified: Secondary | ICD-10-CM | POA: Diagnosis not present

## 2019-04-02 LAB — BASIC METABOLIC PANEL
Anion gap: 10 (ref 5–15)
BUN: 8 mg/dL (ref 8–23)
CO2: 24 mmol/L (ref 22–32)
Calcium: 10.3 mg/dL (ref 8.9–10.3)
Chloride: 100 mmol/L (ref 98–111)
Creatinine, Ser: 0.63 mg/dL (ref 0.44–1.00)
GFR calc Af Amer: >60 mL/min (ref >60)
GFR calc non Af Amer: >60 mL/min (ref >60)
Glucose, Bld: 105 mg/dL — ABNORMAL HIGH (ref 70–99)
Potassium: 4.9 mmol/L (ref 3.5–5.1)
Sodium: 134 mmol/L — ABNORMAL LOW (ref 135–145)

## 2019-04-02 LAB — MAGNESIUM: Magnesium: 2 mg/dL (ref 1.7–2.4)

## 2019-04-05 ENCOUNTER — Encounter (HOSPITAL_COMMUNITY): Payer: Self-pay | Admitting: Nurse Practitioner

## 2019-04-05 NOTE — Progress Notes (Signed)
Primary Care Physician: Donald Prose, MD Referring Physician:Dr. Tora Duck Magdalene River is a 85 y.o. female with a h/o afib, PPM, 2/2 sinus node dysfunction,  HTN, that is in the afib clinic for return of afib after seeing Dr. Caryl Comes and deciding to stop sotalol, per his note, since she had been maintianig SR and possible pro arrhythmia effect of the drug. . Per pt, she still felt she had a lot of afib on sotalol. Her last [facility] report showed 16% afib burden. She found out she was in afib as her husband noted an irregular pulse. She has noted mild increase in exertional dyspnea but no change in energy and she does not feel the afib. She does not have a drug supplement plan so has to pay for all her drugs out of pocket. Her BB dose was increased and she is rate controlled today.  She was hospitalized last week for tikosyn start and is now back for f/u, 9/11. She converted with the drug in the hospital but now is back in afib for the last 2 days. She did feel improved for 5 days. She is rate controlled.   Today, she denies symptoms of palpitations, chest pain, shortness of breath, orthopnea, PND, lower extremity edema, dizziness, presyncope, syncope, or neurologic sequela. The patient is tolerating medications without difficulties and is otherwise without complaint today.   Past Medical History:  Diagnosis Date  . Ankle fracture 2016  . Atrial fibrillation ([place])   . Depression   . Elevated parathyroid hormone   . Fracture of orbital floor with routine healing   . GERD (gastroesophageal reflux disease)   . History of colon polyps    benign  . Hypercalcemia   . Hypertension   . Hyponatremia   . Hypothyroid   . Macular degeneration    wet in the right and dry in the left  . OSA on CPAP   . Osteoporosis   . Peripheral vascular disease ([place])   . PSVT (paroxysmal supraventricular tachycardia) ([place])   . Rotator cuff tear   . Sinus node dysfunction ([facility])    a. s/p MDT pacemaker  .  Stroke ([place])   . Urinary incontinence   . Wrist fracture 2018   Past Surgical History:  Procedure Laterality Date  . BACK SURGERY    . CATARACT EXTRACTION    . CATARACT EXTRACTION W/ INTRAOCULAR LENS  IMPLANT, BILATERAL Bilateral   . COLONOSCOPY    . ESOPHAGOGASTRODUODENOSCOPY    . IR ANGIO INTRA EXTRACRAN SEL COM CAROTID INNOMINATE BILAT MOD SED  12/30/2017  . IR ANGIO VERTEBRAL SEL VERTEBRAL BILAT MOD SED  12/30/2017  . LAPAROSCOPIC CHOLECYSTECTOMY  08/11/1999  . LUMBAR DISC SURGERY  02/2014  . ORIF ANKLE FRACTURE Left 04/13/2015   Procedure: OPEN REDUCTION INTERNAL FIXATION (ORIF) LEFT ANKLE FRACTURE;  Surgeon: Meredith Pel, MD;  Location: [place];  Service: Orthopedics;  Laterality: Left;  . PACEMAKER PLACEMENT Right 2012   a. MDT dual chamber PPM implanted by Dr Caryl Comes   . SHOULDER ARTHROSCOPY W/ ROTATOR CUFF REPAIR Right 08/30/2014   WITH MINI-OPEN ROTATOR CUFF REPAIR AND SUBACROMIAL DECOMPRESSION  . SHOULDER ARTHROSCOPY WITH ROTATOR CUFF REPAIR AND SUBACROMIAL DECOMPRESSION Right 08/30/2014   Procedure: SHOULDER ARTHROSCOPY WITH MINI-OPEN ROTATOR CUFF REPAIR AND SUBACROMIAL DECOMPRESSION;  Surgeon: Meredith Pel, MD;  Location: [place];  Service: Orthopedics;  Laterality: Right;  RIGHT SHOULDER DIAGNOSTIC OPERATIVE ARTHROSCOPY, SUBACROMIAL DECOMPRESSION, MINI-OPEN ROTATOR CUFF REPAIR.  Marland Kitchen VAGINAL HYSTERECTOMY  1970    Current Outpatient Medications  Medication Sig Dispense Refill  . acetaminophen (TYLENOL) 500 MG tablet Take 100 mg by mouth 2 (two) times daily.     Marland Kitchen alendronate (FOSAMAX) 70 MG tablet Take 1 tablet (70 mg total) by mouth every 7 (seven) days. Take with a full glass of water on an empty stomach. (Patient taking differently: Take 70 mg by mouth every Wednesday. Take with a full glass of water on an empty stomach.) 4 tablet 11  . apixaban (ELIQUIS) 5 MG TABS tablet Take 1 tablet (5 mg total) by mouth 2 (two) times daily. (Patient taking differently: Take 5 mg by mouth 2  (two) times daily with a meal. ) 180 tablet 1  . atorvastatin (LIPITOR) 40 MG tablet Take 1 tablet (40 mg total) by mouth daily at 6 PM. 30 tablet 3  . B Complex-C (B-COMPLEX WITH VITAMIN C) tablet Take 1 tablet by mouth 2 (two) times daily with breakfast and lunch.     . Cholecalciferol (VITAMIN D3) 5000 UNITS CAPS Take 5,000 Units by mouth daily with breakfast.     . diltiazem (CARDIZEM CD) 120 MG 24 hr capsule Take 1 capsule (120 mg total) by mouth 2 (two) times daily. (Patient taking differently: Take 120 mg by mouth 2 (two) times daily with a meal. ) 180 capsule 3  . dofetilide (TIKOSYN) 500 MCG capsule Take 1 capsule (500 mcg total) by mouth 2 (two) times daily. 60 capsule 6  . lisinopril (PRINIVIL,ZESTRIL) 20 MG tablet Take 1 tablet (20 mg total) by mouth daily. (Patient taking differently: Take 20 mg by mouth daily with breakfast. ) 90 tablet 1  . Lutein 20 MG TABS Take 20 mg by mouth daily with breakfast.     . magnesium oxide (MAG-OX) 400 MG tablet Take 1 tablet (400 mg total) by mouth daily. (Patient taking differently: Take 400 mg by mouth daily with breakfast. ) 30 tablet 0  . metoprolol succinate (TOPROL-XL) 50 MG 24 hr tablet Take 1 tablet (50 mg total) by mouth daily. Take 2 tablets (100mg ) in the AM, Take 1 tablet (50mg ) in the PM (Patient taking differently: Take 50-100 mg by mouth See admin instructions. Take 2 tablets (100 mg) by mouth with breakfast and 1 tablet (50 mg) with supper) 90 tablet 3  . Multiple Vitamin (MULTIVITAMIN WITH MINERALS) TABS tablet Take 1 tablet by mouth daily with breakfast.     . Multiple Vitamins-Minerals (PRESERVISION AREDS 2 PO) Take 1 capsule by mouth 2 (two) times daily with a meal.     . Omega-3 Fatty Acids (FISH OIL) 1200 MG CAPS Take 1,200 mg by mouth 2 (two) times daily with a meal.     . PRESCRIPTION MEDICATION Inhale into the lungs at bedtime. CPAP    . sertraline (ZOLOFT) 50 MG tablet Take 1 tablet (50 mg total) by mouth daily. (Patient taking  differently: Take 50 mg by mouth daily with breakfast. ) 30 tablet 2  . desmopressin (DDAVP) 0.2 MG tablet Take 0.2 mg by mouth daily as needed (to control bladder on car trip).      No current facility-administered medications for this encounter.     Allergies  Allergen Reactions  . Adhesive [Tape] Swelling and Rash  . Benzalkonium Chloride Rash    Pt was not aware of this allergy  . Neosporin [Neomycin-Polymyxin-Gramicidin] Swelling and Rash    Social History   Socioeconomic History  . Marital status: Married    Spouse name: Not  on file  . Number of children: 4  . Years of education: 8  . Highest education level: Not on file  Occupational History  . 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 Financial resource strain: Not on file  . Food insecurity    Worry: Not on file    Inability: Not on file  . Transportation needs    Medical: Not on file    Non-medical: Not on file  Tobacco Use  . Smoking status: Former Smoker    Packs/day: 1.00    Years: 45.00    Pack years: 45.00    Types: Cigarettes  . Smokeless tobacco: Never Used  . Tobacco comment: quit smoking in 1992  Substance and Sexual Activity  . Alcohol use: Yes    Alcohol/week: 28.0 standard drinks    Types: 28 Glasses of wine per week    Comment: 08/30/2014 "16oz wine q night"  . Drug use: No  . Sexual activity: Yes    Birth control/protection: Surgical  Lifestyle  . Physical activity    Days per week: Not on file    Minutes per session: Not on file  . Stress: Not on file  Relationships  . Social Herbalist on phone: Not on file    Gets together: Not on file    Attends religious service: Not on file    Active member of club or organization: Not on file    Attends meetings of clubs or organizations: Not on file    Relationship status: Not on file  . Intimate partner violence    Fear of current or ex partner: Not on file    Emotionally abused: Not on file     Physically abused: Not on file    Forced sexual activity: Not on file  Other Topics Concern  . Not on file  Social History Narrative   Ms. Trusso lives with her husband at [employer]. They are retired and recently relocated to [facility] from [place]. She has 4 grown children.    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    Family History  Problem Relation Age of Onset  . Colon cancer Mother        colon  . Cancer Father        unknown type  . Stroke Sister   . Skin cancer Brother   . Heart disease Brother   . Breast cancer Paternal Aunt   . Asthma Brother     ROS- All systems are reviewed and negative except as per the HPI above  Physical Exam: Vitals:   04/02/19 1116  BP: (!) 142/70  Pulse: 76  Weight: 90.6 kg  Height: 5\' 8"  (1.727 m)   Wt Readings from Last 3 Encounters:  04/02/19 90.6 kg  03/25/19 91.3 kg  03/22/19 92.7 kg    Labs: Lab Results  Component Value Date   NA 134 (L) 04/02/2019   K 4.9 04/02/2019   CL 100 04/02/2019   CO2 24 04/02/2019   GLUCOSE 105 (H) 04/02/2019   BUN 8 04/02/2019   CREATININE 0.63 04/02/2019   CALCIUM 10.3 04/02/2019   MG 2.0 04/02/2019   Lab Results  Component Value Date   INR 0.99 12/30/2017   Lab Results  Component Value Date   CHOL 163 08/14/2018   HDL 69 08/14/2018   LDLCALC 67 08/14/2018   TRIG 133 08/14/2018  GEN- The patient is well appearing, alert and oriented x 3 today.   Head- normocephalic, atraumatic Eyes-  Sclera clear, conjunctiva pink Ears- hearing intact Oropharynx- clear Neck- supple, no JVP Lymph- no cervical lymphadenopathy Lungs- Clear to ausculation bilaterally, normal work of breathing Heart- irregular rate and rhythm, no murmurs, rubs or gallops, PMI not laterally displaced GI- soft, NT, ND, + BS Extremities- no clubbing, cyanosis, or edema MS- no significant deformity or atrophy Skin- no rash or lesion Psych- euthymic mood, full affect Neuro-  strength and sensation are intact  EKG-afib at 76  Bpm, with a few paced beats,  qrs int 96 ms, qtc 524 ms Epic records reviewed   Assessment and Plan: 1. Persistent  afib Failed sotalol, now on tikosyn, with initial return to SR x 5 days. Now returned to afib Is taking dofetilide correctly without missed doses Will being back in one week and plan on cardioversion if still in afib Continues on Eliquis 5 mg bid    Butch Penny C. Atlee Kluth, [facility] [address] [place], [place] [address] [phone]

## 2019-04-07 DIAGNOSIS — M1712 Unilateral primary osteoarthritis, left knee: Secondary | ICD-10-CM | POA: Diagnosis not present

## 2019-04-07 DIAGNOSIS — M1711 Unilateral primary osteoarthritis, right knee: Secondary | ICD-10-CM | POA: Diagnosis not present

## 2019-04-09 ENCOUNTER — Other Ambulatory Visit: Payer: Self-pay

## 2019-04-09 ENCOUNTER — Encounter (HOSPITAL_COMMUNITY): Payer: Self-pay | Admitting: Nurse Practitioner

## 2019-04-09 ENCOUNTER — Ambulatory Visit (HOSPITAL_COMMUNITY)
Admission: RE | Admit: 2019-04-09 | Discharge: 2019-04-09 | Disposition: A | Discharge status: Home / Self Care | Payer: Medicare Other | Source: Ambulatory Visit | Attending: Nurse Practitioner | Admitting: Nurse Practitioner

## 2019-04-09 VITALS — BP 160/76 | HR 61 | Ht 68.0 in | Wt 199.4 lb

## 2019-04-09 DIAGNOSIS — I4819 Other persistent atrial fibrillation: Secondary | ICD-10-CM

## 2019-04-09 DIAGNOSIS — I4891 Unspecified atrial fibrillation: Secondary | ICD-10-CM | POA: Diagnosis not present

## 2019-04-09 DIAGNOSIS — I4510 Unspecified right bundle-branch block: Secondary | ICD-10-CM | POA: Diagnosis not present

## 2019-04-09 NOTE — Progress Notes (Signed)
Pt in for EKG for recent initiation of tikosyn and at one week f/u pt was in afib. She is a paced today . She has noted some increase in BP around [id number] systolic. She will continue to monitor and f/u with Dr. Caryl Comes 10/9.

## 2019-04-14 ENCOUNTER — Telehealth (HOSPITAL_COMMUNITY): Payer: Self-pay

## 2019-04-14 NOTE — Telephone Encounter (Signed)
Called to schedule f/u us carotid, no answer, left vm. AW 

## 2019-04-15 ENCOUNTER — Other Ambulatory Visit (HOSPITAL_COMMUNITY): Payer: Self-pay | Admitting: Interventional Radiology

## 2019-04-15 DIAGNOSIS — I771 Stricture of artery: Secondary | ICD-10-CM

## 2019-04-23 DIAGNOSIS — Z23 Encounter for immunization: Secondary | ICD-10-CM | POA: Diagnosis not present

## 2019-04-23 DIAGNOSIS — F411 Generalized anxiety disorder: Secondary | ICD-10-CM | POA: Diagnosis not present

## 2019-04-23 DIAGNOSIS — I4891 Unspecified atrial fibrillation: Secondary | ICD-10-CM | POA: Diagnosis not present

## 2019-04-23 DIAGNOSIS — I10 Essential (primary) hypertension: Secondary | ICD-10-CM | POA: Diagnosis not present

## 2019-04-23 DIAGNOSIS — E785 Hyperlipidemia, unspecified: Secondary | ICD-10-CM | POA: Diagnosis not present

## 2019-04-23 DIAGNOSIS — D6869 Other thrombophilia: Secondary | ICD-10-CM | POA: Diagnosis not present

## 2019-04-30 ENCOUNTER — Encounter: Payer: Self-pay | Admitting: Internal Medicine

## 2019-04-30 ENCOUNTER — Ambulatory Visit (INDEPENDENT_AMBULATORY_CARE_PROVIDER_SITE_OTHER): Payer: Medicare Other | Admitting: Internal Medicine

## 2019-04-30 ENCOUNTER — Other Ambulatory Visit: Payer: Self-pay

## 2019-04-30 VITALS — BP 144/78 | HR 64 | Ht 68.0 in | Wt 200.6 lb

## 2019-04-30 DIAGNOSIS — I6300 Cerebral infarction due to thrombosis of unspecified precerebral artery: Secondary | ICD-10-CM | POA: Diagnosis not present

## 2019-04-30 DIAGNOSIS — R001 Bradycardia, unspecified: Secondary | ICD-10-CM | POA: Diagnosis not present

## 2019-04-30 DIAGNOSIS — I480 Paroxysmal atrial fibrillation: Secondary | ICD-10-CM

## 2019-04-30 DIAGNOSIS — I10 Essential (primary) hypertension: Secondary | ICD-10-CM

## 2019-04-30 DIAGNOSIS — Z950 Presence of cardiac pacemaker: Secondary | ICD-10-CM | POA: Diagnosis not present

## 2019-04-30 NOTE — Patient Instructions (Addendum)
Medication Instructions:  Your physician recommends that you continue on your current medications as directed. Please refer to the Current Medication list given to you today.  Labwork: None ordered.  Testing/Procedures: None ordered.  Follow-Up: Your physician recommends that you schedule a follow-up appointment in:   6 months with Roderic Palau, NP in the A Fib clinic  12 months with Dr. Caryl Comes  Any Other Special Instructions Will Be Listed Below (If Applicable).     If you need a refill on your cardiac medications before your next appointment, please call your pharmacy.

## 2019-04-30 NOTE — Progress Notes (Signed)
Patient Care Team: Donald Prose, MD as PCP - General (Family Medicine) Bjorn Loser, MD as Consulting Physician (Urology) Deboraha Sprang, MD as Consulting Physician (Cardiology) Chesley Mires, MD as Consulting Physician (Pulmonary Disease) Melrose Nakayama, MD as Consulting Physician (Orthopedic Surgery) Noralyn Pick, MD as Referring Physician (Ophthalmology) Paulla Dolly Tamala Fothergill, DPM as Consulting Physician (Podiatry)   HPI  Brittany [name] Plan is a 85 y.o. female Seen in followup for  pacer implanted 12/12 for sinus node dysfunction.  She has atrial fibrillation for which she takes sotalol currently of 120 mg twice daily.   Cardiac evaluation has included a Myoview 8/12 which demonstrated no ischemia and normal left ventricular function  DATE TEST EF   6/19 Echo   55-60 %               Admitted 8/20 for dofetilide initiation  Has been holding sinus rhythm and feels much better; less sob, no LH chest pain or edema  No bleeding  Thromboembolic risk factors include age-63 hypertension-1 for a CHADS-  score of 2 and a CHADS-VASc score of 4     Date Cr K Mg Hgb   5/17   1.9   1/18  0.59 4.5   14.7  11/18 0.55 4.4  15.8  3/20 0.61 4.4 2.1 12.9  9/20 0.63 4.9 2.0             Past Medical History:  Diagnosis Date  . Ankle fracture 2016  . Atrial fibrillation ([place])   . Depression   . Elevated parathyroid hormone   . Fracture of orbital floor with routine healing   . GERD (gastroesophageal reflux disease)   . History of colon polyps    benign  . Hypercalcemia   . Hypertension   . Hyponatremia   . Hypothyroid   . Macular degeneration    wet in the right and dry in the left  . OSA on CPAP   . Osteoporosis   . Peripheral vascular disease ([place])   . PSVT (paroxysmal supraventricular tachycardia) ([place])   . Rotator cuff tear   . Sinus node dysfunction ([facility])    a. s/p MDT pacemaker  . Stroke ([place])   . Urinary incontinence   . Wrist fracture 2018    Past Surgical History:  Procedure Laterality Date  . BACK SURGERY    . CATARACT EXTRACTION    . CATARACT EXTRACTION W/ INTRAOCULAR LENS  IMPLANT, BILATERAL Bilateral   . COLONOSCOPY    . ESOPHAGOGASTRODUODENOSCOPY    . IR ANGIO INTRA EXTRACRAN SEL COM CAROTID INNOMINATE BILAT MOD SED  12/30/2017  . IR ANGIO VERTEBRAL SEL VERTEBRAL BILAT MOD SED  12/30/2017  . LAPAROSCOPIC CHOLECYSTECTOMY  08/11/1999  . LUMBAR DISC SURGERY  02/2014  . ORIF ANKLE FRACTURE Left 04/13/2015   Procedure: OPEN REDUCTION INTERNAL FIXATION (ORIF) LEFT ANKLE FRACTURE;  Surgeon: Meredith Pel, MD;  Location: [place];  Service: Orthopedics;  Laterality: Left;  . PACEMAKER PLACEMENT Right 2012   a. MDT dual chamber PPM implanted by Dr Caryl Comes   . SHOULDER ARTHROSCOPY W/ ROTATOR CUFF REPAIR Right 08/30/2014   WITH MINI-OPEN ROTATOR CUFF REPAIR AND SUBACROMIAL DECOMPRESSION  . SHOULDER ARTHROSCOPY WITH ROTATOR CUFF REPAIR AND SUBACROMIAL DECOMPRESSION Right 08/30/2014   Procedure: SHOULDER ARTHROSCOPY WITH MINI-OPEN ROTATOR CUFF REPAIR AND SUBACROMIAL DECOMPRESSION;  Surgeon: Meredith Pel, MD;  Location: [place];  Service: Orthopedics;  Laterality: Right;  RIGHT SHOULDER DIAGNOSTIC OPERATIVE ARTHROSCOPY, SUBACROMIAL DECOMPRESSION, MINI-OPEN ROTATOR  CUFF REPAIR.  Marland Kitchen VAGINAL HYSTERECTOMY  1970    Current Outpatient Medications  Medication Sig Dispense Refill  . acetaminophen (TYLENOL) 500 MG tablet Take 100 mg by mouth 2 (two) times daily.     Marland Kitchen alendronate (FOSAMAX) 70 MG tablet Take 1 tablet (70 mg total) by mouth every 7 (seven) days. Take with a full glass of water on an empty stomach. 4 tablet 11  . apixaban (ELIQUIS) 5 MG TABS tablet Take 1 tablet (5 mg total) by mouth 2 (two) times daily. 180 tablet 1  . atorvastatin (LIPITOR) 40 MG tablet Take 1 tablet (40 mg total) by mouth daily at 6 PM. 30 tablet 3  . B Complex-C (B-COMPLEX WITH VITAMIN C) tablet Take 1 tablet by mouth daily with lunch.     . Cholecalciferol  (VITAMIN D3) 5000 UNITS CAPS Take 5,000 Units by mouth daily with breakfast.     . desmopressin (DDAVP) 0.2 MG tablet Take 0.2 mg by mouth daily as needed (to control bladder on car trip).     Marland Kitchen diltiazem (CARDIZEM CD) 120 MG 24 hr capsule Take 1 capsule (120 mg total) by mouth 2 (two) times daily. 180 capsule 3  . dofetilide (TIKOSYN) 500 MCG capsule Take 1 capsule (500 mcg total) by mouth 2 (two) times daily. 60 capsule 6  . lisinopril (PRINIVIL,ZESTRIL) 20 MG tablet Take 1 tablet (20 mg total) by mouth daily. 90 tablet 1  . Lutein 20 MG TABS Take 20 mg by mouth daily with breakfast.     . magnesium oxide (MAG-OX) 400 MG tablet Take 1 tablet (400 mg total) by mouth daily. 30 tablet 0  . metoprolol succinate (TOPROL-XL) 50 MG 24 hr tablet Take 100 mg by mouth 2 (two) times daily. Take with or immediately following a meal.    . Multiple Vitamin (MULTIVITAMIN WITH MINERALS) TABS tablet Take 1 tablet by mouth daily with breakfast.     . Multiple Vitamins-Minerals (PRESERVISION AREDS 2 PO) Take 1 capsule by mouth 2 (two) times daily with a meal.     . Omega-3 Fatty Acids (FISH OIL) 1200 MG CAPS Take 1,200 mg by mouth 2 (two) times daily with a meal.     . PRESCRIPTION MEDICATION Inhale into the lungs at bedtime. CPAP    . sertraline (ZOLOFT) 50 MG tablet Take 1 tablet (50 mg total) by mouth daily. 30 tablet 2   No current facility-administered medications for this visit.     Allergies  Allergen Reactions  . Adhesive [Tape] Swelling and Rash  . Benzalkonium Chloride Rash    Pt was not aware of this allergy  . Neosporin [Neomycin-Polymyxin-Gramicidin] Swelling and Rash      Review of Systems negative except from HPI and PMH  Physical Exam BP (!) 144/78   Pulse 64   Ht 5\' 8"  (1.727 m)   Wt 200 lb 9.6 oz (91 kg)   LMP 07/22/1968   SpO2 98%   BMI 30.50 kg/m  Well developed and well nourished in no acute distress HENT normal Neck supple with JVP-flat Clear Device pocket well healed;  without hematoma or erythema.  There is no tethering  Regular rate and rhythm, no  murmur Abd-soft with active BS No Clubbing cyanosis   edema Skin-warm and dry A & Oriented  Grossly normal sensory and motor function  ECG AV pacing 70 26/10/46  9/11 prob afib  9/18  AV pacing    Assessment and  Plan Atrial fibrillation-persistent  Sinus bradycardia   HFpEF  Pacemaker-Medtronic  The patient's device was interrogated.  The information was reviewed. No changes were made in the programming.       High Risk Medication Surveillance -Dofetilide  Hypertension  Hyperlipidemia  Maintaining sinus on dofetilide.  Surveillance laboratories are normal  On Anticoagulation;  No bleeding issues   Euvolemic continue current meds  Most recent labs demonstrated an LDL of 72.  She asked whether she should be maintained on a statin.  Literature review suggests ongoing benefit in patients over 75; do not have data in octogenarians to guide but she is willing to continue  We spent more than 50% of our >25 min visit in face to face counseling regarding the above    We spent more than 50% of our >25 min visit in face to face counseling regarding the above

## 2019-05-04 ENCOUNTER — Other Ambulatory Visit: Payer: Self-pay | Admitting: Internal Medicine

## 2019-05-04 MED ORDER — METOPROLOL SUCCINATE ER 50 MG PO TB24: 100.0000 mg | ORAL_TABLET | Freq: Two times a day (BID) | ORAL | 3 refills | Status: DC

## 2019-05-04 NOTE — Telephone Encounter (Signed)
Pt's medication was sent to pt's pharmacy as requested. Confirmation received.  °

## 2019-05-05 ENCOUNTER — Ambulatory Visit (HOSPITAL_COMMUNITY)
Admission: RE | Admit: 2019-05-05 | Discharge: 2019-05-05 | Disposition: A | Discharge status: Home / Self Care | Payer: Medicare Other | Source: Ambulatory Visit | Attending: Interventional Radiology | Admitting: Interventional Radiology

## 2019-05-05 ENCOUNTER — Other Ambulatory Visit: Payer: Self-pay

## 2019-05-05 DIAGNOSIS — I771 Stricture of artery: Secondary | ICD-10-CM | POA: Insufficient documentation

## 2019-05-05 LAB — CUP PACEART INCLINIC DEVICE CHECK
Battery Impedance: 2254 Ohm
Battery Remaining Longevity: 27 mo
Battery Voltage: 2.74 V
Brady Statistic AP VP Percent: 1 %
Brady Statistic AP VS Percent: 87 %
Brady Statistic AS VP Percent: 2 %
Brady Statistic AS VS Percent: 10 %
Date Time Interrogation Session: 20201009141346
Implantable Lead Implant Date: 20121213
Implantable Lead Implant Date: 20121213
Implantable Lead Location: 753859
Implantable Lead Location: 753860
Implantable Lead Model: 4076
Implantable Lead Model: 4076
Implantable Lead Serial Number: 835025
Implantable Lead Serial Number: 869269
Implantable Pulse Generator Implant Date: 20121213
Lead Channel Impedance Value: 405 Ohm
Lead Channel Impedance Value: 528 Ohm
Lead Channel Pacing Threshold Amplitude: 0.5 V
Lead Channel Pacing Threshold Amplitude: 0.75 V
Lead Channel Pacing Threshold Pulse Width: 0.4 ms
Lead Channel Pacing Threshold Pulse Width: 0.4 ms
Lead Channel Sensing Intrinsic Amplitude: 15.67 mV
Lead Channel Setting Pacing Amplitude: 2 V
Lead Channel Setting Pacing Amplitude: 2.5 V
Lead Channel Setting Pacing Pulse Width: 0.4 ms
Lead Channel Setting Sensing Sensitivity: 5.6 mV

## 2019-05-07 ENCOUNTER — Telehealth (HOSPITAL_COMMUNITY): Payer: Self-pay | Admitting: Radiology

## 2019-05-07 NOTE — Telephone Encounter (Signed)
Called pt, left VM that Deveshwar has reviewed her US carotids and suggests follow-up US in 6 months time JM

## 2019-05-11 ENCOUNTER — Other Ambulatory Visit: Payer: Self-pay

## 2019-05-11 ENCOUNTER — Ambulatory Visit
Admission: RE | Admit: 2019-05-11 | Discharge: 2019-05-11 | Disposition: A | Discharge status: Home / Self Care | Payer: Medicare Other | Source: Ambulatory Visit | Attending: Family Medicine | Admitting: Family Medicine

## 2019-05-11 DIAGNOSIS — Z1231 Encounter for screening mammogram for malignant neoplasm of breast: Secondary | ICD-10-CM | POA: Diagnosis not present

## 2019-05-25 ENCOUNTER — Telehealth: Payer: Self-pay | Admitting: Pulmonary Disease

## 2019-05-25 NOTE — Telephone Encounter (Signed)
Spoke with pt, who clarified that she has been taking Tylenol BID Xseveral years for arthritis per her PCP.  Pt denies any other covid symptoms.  Discussed with Beth, who advised that it is ok for pt to keep her appt as scheduled in person.  Pt is aware.  Nothing further needed at this time- will close encounter.

## 2019-05-26 ENCOUNTER — Other Ambulatory Visit: Payer: Self-pay

## 2019-05-26 ENCOUNTER — Encounter: Payer: Self-pay | Admitting: Pulmonary Disease

## 2019-05-26 ENCOUNTER — Ambulatory Visit (INDEPENDENT_AMBULATORY_CARE_PROVIDER_SITE_OTHER): Payer: Medicare Other | Admitting: Pulmonary Disease

## 2019-05-26 VITALS — BP 152/70 | HR 65 | Ht 68.0 in | Wt 201.8 lb

## 2019-05-26 DIAGNOSIS — Z9989 Dependence on other enabling machines and devices: Secondary | ICD-10-CM | POA: Diagnosis not present

## 2019-05-26 DIAGNOSIS — I6300 Cerebral infarction due to thrombosis of unspecified precerebral artery: Secondary | ICD-10-CM | POA: Diagnosis not present

## 2019-05-26 DIAGNOSIS — G4733 Obstructive sleep apnea (adult) (pediatric): Secondary | ICD-10-CM | POA: Diagnosis not present

## 2019-05-26 NOTE — Patient Instructions (Signed)
Follow up in 1 year.

## 2019-05-26 NOTE — Progress Notes (Signed)
[facility]  Chief Complaint  Patient presents with  . Follow-up    Follow up for OSA on CPAP. DME: [employer]. Pt states she has been doing well and denies any complaints or concerns.    Constitutional:  BP (!) 152/70 (BP Location: Left Arm, Patient Position: Sitting, Cuff Size: Normal)   Pulse 65   Ht 5\' 8"  (1.727 m)   Wt 201 lb 12.8 oz (91.5 kg)   LMP 07/22/1968   SpO2 98%   BMI 30.68 kg/m   Past Medical History:  A fib, HTN, Depression, Hypothyroidism, GERD, Hyponatremia, CVA, Sinus node dysfunction s/p PM, PSVT, Osteoporosis, Macular degeneration, Hypercalcemia, Colon polyps  Brief Summary:  Brittany [name] is a 85 y.o. female with obstructive sleep apnea.  Since I saw her last she received a new CPAP machine.  This is working well.  No issues with pressure setting or mask fit.  Not having sinus congestion, sore throat, dry mouth, or aerophagia.  Feels rested during the day.  Was in hospital in August due to A fib.  Started on tikosyn.  Physical Exam:   Appearance - well kempt   ENMT - no sinus tenderness, no nasal discharge, no oral exudate  Neck - no masses, trachea midline, no thyromegaly, no elevation in JVP  Respiratory - normal appearance of chest wall, normal respiratory effort w/o accessory muscle use, no dullness on percussion, no wheezing or rales  CV - s1s2 regular rate and rhythm, no murmurs, no peripheral edema, radial pulses symmetric  GI - soft, non tender  Lymph - no adenopathy noted in neck and axillary areas  MSK - normal gait  Ext - no cyanosis, clubbing, or joint inflammation noted  Skin - no rashes, lesions, or ulcers  Neuro - normal strength, oriented x 3  Psych - normal mood and affect   Assessment/Plan:   Obstructive sleep apnea. - she is compliant with CPAP and reports benefit - continue CPAP 8 cm H2O  Atrial fibrillation. - followed by Dr. Caryl Comes with Cardiology   Patient Instructions   Follow up in 1 year    Brittany Mires, MD [facility] Pager: [phone] 05/26/2019, 10:44 AM  Flow Sheet    Sleep tests:  PSG 08/13/12 ([place]) >> AHI 24, SaO2 low 72%, CPAP 8 PSG 07/03/14 >> AHI 24.4, SaO2 low 76%, PLMI 81.8. CPAP 8 cm H2O >> AHI 0, +S.  CPAP 04/25/19 to 05/24/19 >> used on 30 of 30 nights with average 9 hrs 28 min.  Average AHI 3 with CPAP 8 cm H2O.  Cardiac tests:  Echo 12/31/17 >> EF 55 to 60%, mild AR   Medications:   Allergies as of 05/26/2019      Reactions   Adhesive [tape] Swelling, Rash   Benzalkonium Chloride Rash   Pt was not aware of this allergy   Neosporin [neomycin-polymyxin-gramicidin] Swelling, Rash      Medication List       Accurate as of May 26, 2019 10:44 AM. If you have any questions, ask your nurse or doctor.        acetaminophen 500 MG tablet Commonly known as: TYLENOL Take 100 mg by mouth 2 (two) times daily.   alendronate 70 MG tablet Commonly known as: FOSAMAX Take 1 tablet (70 mg total) by mouth every 7 (seven) days. Take with a full glass of water on an empty stomach.   apixaban 5 MG Tabs tablet Commonly known as: ELIQUIS Take 1 tablet (  5 mg total) by mouth 2 (two) times daily.   atorvastatin 40 MG tablet Commonly known as: LIPITOR Take 1 tablet (40 mg total) by mouth daily at 6 PM.   B-complex with vitamin C tablet Take 1 tablet by mouth daily with lunch.   desmopressin 0.2 MG tablet Commonly known as: DDAVP Take 0.2 mg by mouth daily as needed (to control bladder on car trip).   diltiazem 120 MG 24 hr capsule Commonly known as: CARDIZEM CD Take 1 capsule (120 mg total) by mouth 2 (two) times daily.   dofetilide 500 MCG capsule Commonly known as: TIKOSYN Take 1 capsule (500 mcg total) by mouth 2 (two) times daily.   Fish Oil 1200 MG Caps Take 1,200 mg by mouth 2 (two) times daily with a meal.   lisinopril 20 MG tablet Commonly known as: ZESTRIL Take 1 tablet (20 mg total)  by mouth daily.   Lutein 20 MG Tabs Take 20 mg by mouth daily with breakfast.   magnesium oxide 400 MG tablet Commonly known as: MAG-OX Take 1 tablet (400 mg total) by mouth daily.   metoprolol succinate 50 MG 24 hr tablet Commonly known as: TOPROL-XL Take 2 tablets (100 mg total) by mouth 2 (two) times daily. Take with or immediately following a meal.   multivitamin with minerals Tabs tablet Take 1 tablet by mouth daily with breakfast.   PRESCRIPTION MEDICATION Inhale into the lungs at bedtime. CPAP   PRESERVISION AREDS 2 PO Take 1 capsule by mouth 2 (two) times daily with a meal.   sertraline 50 MG tablet Commonly known as: Zoloft Take 1 tablet (50 mg total) by mouth daily.   Vitamin D3 125 MCG (5000 UT) Caps Take 5,000 Units by mouth daily with breakfast.       Past Surgical History:  She  has a past surgical history that includes pacemaker placement (Right, 2012); Cataract extraction; Colonoscopy; Esophagogastroduodenoscopy; Shoulder arthroscopy w/ rotator cuff repair (Right, 08/30/2014); Laparoscopic cholecystectomy (08/11/1999); Lumbar disc surgery (02/2014); Back surgery; Vaginal hysterectomy (1970); Cataract extraction w/ intraocular lens  implant, bilateral (Bilateral); Shoulder arthroscopy with rotator cuff repair and subacromial decompression (Right, 08/30/2014); ORIF ankle fracture (Left, 04/13/2015); IR ANGIO VERTEBRAL SEL VERTEBRAL BILAT MOD SED (12/30/2017); and IR ANGIO INTRA EXTRACRAN SEL COM CAROTID INNOMINATE BILAT MOD SED (12/30/2017).  Family History:  Her family history includes Asthma in her brother; Breast cancer in her paternal aunt; Cancer in her father; Colon cancer in her mother; Heart disease in her brother; Skin cancer in her brother; Stroke in her sister.  Social History:  She  reports that she has quit smoking. Her smoking use included cigarettes. She has a 45.00 pack-year smoking history. She has never used smokeless tobacco. She reports current alcohol  use of about 28.0 standard drinks of alcohol per week. She reports that she does not use drugs.

## 2019-05-28 DIAGNOSIS — M1712 Unilateral primary osteoarthritis, left knee: Secondary | ICD-10-CM | POA: Diagnosis not present

## 2019-05-28 DIAGNOSIS — M1711 Unilateral primary osteoarthritis, right knee: Secondary | ICD-10-CM | POA: Diagnosis not present

## 2019-06-02 ENCOUNTER — Ambulatory Visit (INDEPENDENT_AMBULATORY_CARE_PROVIDER_SITE_OTHER): Payer: Medicare Other | Admitting: *Deleted

## 2019-06-02 DIAGNOSIS — I4891 Unspecified atrial fibrillation: Secondary | ICD-10-CM

## 2019-06-02 DIAGNOSIS — I495 Sick sinus syndrome: Secondary | ICD-10-CM | POA: Diagnosis not present

## 2019-06-02 LAB — CUP PACEART REMOTE DEVICE CHECK
Battery Impedance: 2247 Ohm
Battery Remaining Longevity: 27 mo
Battery Voltage: 2.74 V
Brady Statistic AP VP Percent: 1 %
Brady Statistic AP VS Percent: 87 %
Brady Statistic AS VP Percent: 5 %
Brady Statistic AS VS Percent: 7 %
Date Time Interrogation Session: 20201111125037
Implantable Lead Implant Date: 20121213
Implantable Lead Implant Date: 20121213
Implantable Lead Location: 753859
Implantable Lead Location: 753860
Implantable Lead Model: 4076
Implantable Lead Model: 4076
Implantable Lead Serial Number: 835025
Implantable Lead Serial Number: 869269
Implantable Pulse Generator Implant Date: 20121213
Lead Channel Impedance Value: 397 Ohm
Lead Channel Impedance Value: 524 Ohm
Lead Channel Pacing Threshold Amplitude: 0.625 V
Lead Channel Pacing Threshold Amplitude: 0.625 V
Lead Channel Pacing Threshold Pulse Width: 0.4 ms
Lead Channel Pacing Threshold Pulse Width: 0.4 ms
Lead Channel Setting Pacing Amplitude: 2 V
Lead Channel Setting Pacing Amplitude: 2.5 V
Lead Channel Setting Pacing Pulse Width: 0.4 ms
Lead Channel Setting Sensing Sensitivity: 5.6 mV

## 2019-06-24 NOTE — Progress Notes (Signed)
Remote pacemaker transmission.   

## 2019-06-25 DIAGNOSIS — M1711 Unilateral primary osteoarthritis, right knee: Secondary | ICD-10-CM | POA: Diagnosis not present

## 2019-06-25 DIAGNOSIS — M1712 Unilateral primary osteoarthritis, left knee: Secondary | ICD-10-CM | POA: Diagnosis not present

## 2019-07-20 DIAGNOSIS — M810 Age-related osteoporosis without current pathological fracture: Secondary | ICD-10-CM | POA: Diagnosis not present

## 2019-07-20 DIAGNOSIS — E785 Hyperlipidemia, unspecified: Secondary | ICD-10-CM | POA: Diagnosis not present

## 2019-07-20 DIAGNOSIS — I10 Essential (primary) hypertension: Secondary | ICD-10-CM | POA: Diagnosis not present

## 2019-07-20 DIAGNOSIS — I4891 Unspecified atrial fibrillation: Secondary | ICD-10-CM | POA: Diagnosis not present

## 2019-08-03 DIAGNOSIS — M545 Low back pain: Secondary | ICD-10-CM | POA: Diagnosis not present

## 2019-08-03 DIAGNOSIS — M5440 Lumbago with sciatica, unspecified side: Secondary | ICD-10-CM | POA: Diagnosis not present

## 2019-08-03 DIAGNOSIS — M9904 Segmental and somatic dysfunction of sacral region: Secondary | ICD-10-CM | POA: Diagnosis not present

## 2019-08-03 DIAGNOSIS — M9903 Segmental and somatic dysfunction of lumbar region: Secondary | ICD-10-CM | POA: Diagnosis not present

## 2019-08-04 DIAGNOSIS — Z23 Encounter for immunization: Secondary | ICD-10-CM | POA: Diagnosis not present

## 2019-08-10 DIAGNOSIS — M5440 Lumbago with sciatica, unspecified side: Secondary | ICD-10-CM | POA: Diagnosis not present

## 2019-08-10 DIAGNOSIS — M9903 Segmental and somatic dysfunction of lumbar region: Secondary | ICD-10-CM | POA: Diagnosis not present

## 2019-08-10 DIAGNOSIS — M9904 Segmental and somatic dysfunction of sacral region: Secondary | ICD-10-CM | POA: Diagnosis not present

## 2019-08-10 DIAGNOSIS — M545 Low back pain: Secondary | ICD-10-CM | POA: Diagnosis not present

## 2019-08-18 DIAGNOSIS — I10 Essential (primary) hypertension: Secondary | ICD-10-CM | POA: Diagnosis not present

## 2019-08-18 DIAGNOSIS — M810 Age-related osteoporosis without current pathological fracture: Secondary | ICD-10-CM | POA: Diagnosis not present

## 2019-08-18 DIAGNOSIS — I4891 Unspecified atrial fibrillation: Secondary | ICD-10-CM | POA: Diagnosis not present

## 2019-08-18 DIAGNOSIS — E785 Hyperlipidemia, unspecified: Secondary | ICD-10-CM | POA: Diagnosis not present

## 2019-08-26 ENCOUNTER — Telehealth: Payer: Self-pay | Admitting: Internal Medicine

## 2019-08-26 DIAGNOSIS — M47816 Spondylosis without myelopathy or radiculopathy, lumbar region: Secondary | ICD-10-CM | POA: Diagnosis not present

## 2019-08-26 NOTE — Telephone Encounter (Signed)
New Message    Pt is calling stating she is scheduled for a Cortizone injection on Feb 23rd and wants to see if she can hold her Eliquis     Please advise

## 2019-08-26 NOTE — Telephone Encounter (Signed)
I s/w the pt who states she is having a steroid injection 09/14/19 with Dr. Maia Petties and wants to know if ok to hold her Eliquis x 3 days prior. I explained to the pt that I will need to call Dr. Henriette Combs office to obtain the procedure information as well as further information needed for the procedure.   I left message for Dr. Henriette Combs office to please fax over a clearance form to [phone] ATTN: Arbie Cookey.

## 2019-08-27 NOTE — Telephone Encounter (Signed)
   Primary Cardiologist: No primary care provider on file.  Virl Axe, MD   Chart reviewed as part of pre-operative protocol coverage. Patient was contacted 08/27/2019 in reference to pre-operative risk assessment for pending surgery as outlined below.  Keelan Galligher was last seen on 04/30/2019 by Dr Caryl Comes.  Since that day, Miasha Fetner has done well from a cardiovascular standpoint. She has significant limitations from MS issues. Her knees limit her. She also has back issues, the reason for the epidural. However, per the DASI score, she has a functional capacity in METs of 4.64.   Therefore, based on ACC/AHA guidelines, the patient would be at acceptable risk for the planned procedure without further cardiovascular testing.   Pharmacist to address holding Eliquis, the [place] has requested it be held for 3 days.  I will route this recommendation to the requesting party via Epic fax function and remove from pre-op pool.  Please call with questions.  Rosaria Ferries, PA-C 08/27/2019, 9:58 AM

## 2019-08-27 NOTE — Telephone Encounter (Signed)
   [facility] Pre-operative Risk Assessment    Request for surgical clearance:  1. What type of surgery is being performed? EPIDURAL STEROID INJECTION   2. When is this surgery scheduled? 09/14/19   3. What type of clearance is required (medical clearance vs. Pharmacy clearance to hold med vs. Both)? BOTH  4. Are there any medications that need to be held prior to surgery and how long? ELIQUIS   5. Practice name and name of physician performing surgery? [facility]; MD NOT LISTED   6. What is your office phone number [phone]    7.   What is your office fax number [phone] ATTN: CLINIC  8.   Anesthesia type (None, local, MAC, general) ? LOCAL WITH 1% LIDOCAINE   Brittany [name] 08/27/2019, 8:07 AM  _________________________________________________________________   (provider comments below)

## 2019-08-30 NOTE — Telephone Encounter (Signed)
Patient with diagnosis of atrial fibrillation on Apixaban for anticoagulation.    Procedure: epidural steroid injection Date of procedure: 09/14/2019  CHADS2-VASc score of 6 (HTN, AGEx2, stroke/tia x 2, female)  CrCl ~70-90 mL/min, Scr. 0.67 Platelet count 233  Typically pharmacy would recommend holding Apixaban for 3 days prior to procedure, however, since this patient is at high risk due to previous stroke, will defer to Dr. Caryl Comes to decide.  Patient should restart Apixaban at earliest time that it is safe to do so.  Sherren Kerns, PharmD PGY1 Acute Care Pharmacy Resident

## 2019-08-30 NOTE — Telephone Encounter (Signed)
Pharmacy, can you please comment on how long patient can hold Eliquis for spinal injection?  Thank you!

## 2019-08-31 DIAGNOSIS — Z23 Encounter for immunization: Secondary | ICD-10-CM | POA: Diagnosis not present

## 2019-09-01 ENCOUNTER — Ambulatory Visit (INDEPENDENT_AMBULATORY_CARE_PROVIDER_SITE_OTHER): Payer: Medicare Other | Admitting: *Deleted

## 2019-09-01 DIAGNOSIS — I495 Sick sinus syndrome: Secondary | ICD-10-CM

## 2019-09-01 LAB — CUP PACEART REMOTE DEVICE CHECK
Battery Impedance: 2156 Ohm
Battery Remaining Longevity: 28 mo
Battery Voltage: 2.74 V
Brady Statistic AP VP Percent: 3 %
Brady Statistic AP VS Percent: 66 %
Brady Statistic AS VP Percent: 11 %
Brady Statistic AS VS Percent: 20 %
Date Time Interrogation Session: 20210210093137
Implantable Lead Implant Date: 20121213
Implantable Lead Implant Date: 20121213
Implantable Lead Location: 753859
Implantable Lead Location: 753860
Implantable Lead Model: 4076
Implantable Lead Model: 4076
Implantable Lead Serial Number: 835025
Implantable Lead Serial Number: 869269
Implantable Pulse Generator Implant Date: 20121213
Lead Channel Impedance Value: 390 Ohm
Lead Channel Impedance Value: 508 Ohm
Lead Channel Pacing Threshold Amplitude: 0.625 V
Lead Channel Pacing Threshold Amplitude: 0.625 V
Lead Channel Pacing Threshold Pulse Width: 0.4 ms
Lead Channel Pacing Threshold Pulse Width: 0.4 ms
Lead Channel Setting Pacing Amplitude: 2 V
Lead Channel Setting Pacing Amplitude: 2.5 V
Lead Channel Setting Pacing Pulse Width: 0.4 ms
Lead Channel Setting Sensing Sensitivity: 4 mV

## 2019-09-02 NOTE — Progress Notes (Signed)
PPM Remote  

## 2019-09-06 NOTE — Telephone Encounter (Signed)
Would recommend MD approval - emobolic stroke June [id number]; previously had CHADS2-VASc score of 4 but declined anticoagulation until stroke occured

## 2019-09-07 NOTE — Telephone Encounter (Signed)
I spoke with the patient last week.  Reviewed what I can find on anticoagulation and neuro axial injection.  Recommendations include the use of bridging Lovenox.  I reviewed this with the patient and the issues related to bleeding as well as the risks related to stopping anticoagulation and associated increased risk of stroke.  Following that discussion she elected to defer the neuro axial injection.\

## 2019-09-14 DIAGNOSIS — M47816 Spondylosis without myelopathy or radiculopathy, lumbar region: Secondary | ICD-10-CM | POA: Diagnosis not present

## 2019-09-17 DIAGNOSIS — G8929 Other chronic pain: Secondary | ICD-10-CM | POA: Diagnosis not present

## 2019-09-17 DIAGNOSIS — M545 Low back pain: Secondary | ICD-10-CM | POA: Diagnosis not present

## 2019-09-22 DIAGNOSIS — M170 Bilateral primary osteoarthritis of knee: Secondary | ICD-10-CM | POA: Diagnosis not present

## 2019-09-22 DIAGNOSIS — R278 Other lack of coordination: Secondary | ICD-10-CM | POA: Diagnosis not present

## 2019-09-27 DIAGNOSIS — M47816 Spondylosis without myelopathy or radiculopathy, lumbar region: Secondary | ICD-10-CM | POA: Diagnosis not present

## 2019-09-28 DIAGNOSIS — F411 Generalized anxiety disorder: Secondary | ICD-10-CM | POA: Diagnosis not present

## 2019-09-28 DIAGNOSIS — Z8673 Personal history of transient ischemic attack (TIA), and cerebral infarction without residual deficits: Secondary | ICD-10-CM | POA: Diagnosis not present

## 2019-09-28 DIAGNOSIS — E785 Hyperlipidemia, unspecified: Secondary | ICD-10-CM | POA: Diagnosis not present

## 2019-09-28 DIAGNOSIS — M810 Age-related osteoporosis without current pathological fracture: Secondary | ICD-10-CM | POA: Diagnosis not present

## 2019-09-28 DIAGNOSIS — I10 Essential (primary) hypertension: Secondary | ICD-10-CM | POA: Diagnosis not present

## 2019-09-28 DIAGNOSIS — I4891 Unspecified atrial fibrillation: Secondary | ICD-10-CM | POA: Diagnosis not present

## 2019-09-28 DIAGNOSIS — N3281 Overactive bladder: Secondary | ICD-10-CM | POA: Diagnosis not present

## 2019-09-29 DIAGNOSIS — B078 Other viral warts: Secondary | ICD-10-CM | POA: Diagnosis not present

## 2019-09-29 DIAGNOSIS — X32XXXA Exposure to sunlight, initial encounter: Secondary | ICD-10-CM | POA: Diagnosis not present

## 2019-09-29 DIAGNOSIS — M170 Bilateral primary osteoarthritis of knee: Secondary | ICD-10-CM | POA: Diagnosis not present

## 2019-09-29 DIAGNOSIS — R278 Other lack of coordination: Secondary | ICD-10-CM | POA: Diagnosis not present

## 2019-09-29 DIAGNOSIS — L570 Actinic keratosis: Secondary | ICD-10-CM | POA: Diagnosis not present

## 2019-10-06 DIAGNOSIS — I4891 Unspecified atrial fibrillation: Secondary | ICD-10-CM | POA: Diagnosis not present

## 2019-10-06 DIAGNOSIS — E785 Hyperlipidemia, unspecified: Secondary | ICD-10-CM | POA: Diagnosis not present

## 2019-10-06 DIAGNOSIS — M810 Age-related osteoporosis without current pathological fracture: Secondary | ICD-10-CM | POA: Diagnosis not present

## 2019-10-06 DIAGNOSIS — I10 Essential (primary) hypertension: Secondary | ICD-10-CM | POA: Diagnosis not present

## 2019-10-11 DIAGNOSIS — M546 Pain in thoracic spine: Secondary | ICD-10-CM | POA: Diagnosis not present

## 2019-10-11 DIAGNOSIS — M9903 Segmental and somatic dysfunction of lumbar region: Secondary | ICD-10-CM | POA: Diagnosis not present

## 2019-10-11 DIAGNOSIS — M9902 Segmental and somatic dysfunction of thoracic region: Secondary | ICD-10-CM | POA: Diagnosis not present

## 2019-10-11 DIAGNOSIS — M545 Low back pain: Secondary | ICD-10-CM | POA: Diagnosis not present

## 2019-10-17 ENCOUNTER — Other Ambulatory Visit (HOSPITAL_COMMUNITY): Payer: Self-pay | Admitting: Student

## 2019-10-18 DIAGNOSIS — M47816 Spondylosis without myelopathy or radiculopathy, lumbar region: Secondary | ICD-10-CM | POA: Diagnosis not present

## 2019-10-18 DIAGNOSIS — I10 Essential (primary) hypertension: Secondary | ICD-10-CM | POA: Diagnosis not present

## 2019-11-01 ENCOUNTER — Ambulatory Visit (HOSPITAL_COMMUNITY): Payer: Medicare Other | Admitting: Nurse Practitioner

## 2019-11-01 DIAGNOSIS — M47816 Spondylosis without myelopathy or radiculopathy, lumbar region: Secondary | ICD-10-CM | POA: Diagnosis not present

## 2019-11-02 DIAGNOSIS — Z1389 Encounter for screening for other disorder: Secondary | ICD-10-CM | POA: Diagnosis not present

## 2019-11-02 DIAGNOSIS — D6869 Other thrombophilia: Secondary | ICD-10-CM | POA: Diagnosis not present

## 2019-11-02 DIAGNOSIS — N3281 Overactive bladder: Secondary | ICD-10-CM | POA: Diagnosis not present

## 2019-11-02 DIAGNOSIS — G4733 Obstructive sleep apnea (adult) (pediatric): Secondary | ICD-10-CM | POA: Diagnosis not present

## 2019-11-02 DIAGNOSIS — I4891 Unspecified atrial fibrillation: Secondary | ICD-10-CM | POA: Diagnosis not present

## 2019-11-02 DIAGNOSIS — Z8673 Personal history of transient ischemic attack (TIA), and cerebral infarction without residual deficits: Secondary | ICD-10-CM | POA: Diagnosis not present

## 2019-11-02 DIAGNOSIS — I10 Essential (primary) hypertension: Secondary | ICD-10-CM | POA: Diagnosis not present

## 2019-11-02 DIAGNOSIS — F411 Generalized anxiety disorder: Secondary | ICD-10-CM | POA: Diagnosis not present

## 2019-11-02 DIAGNOSIS — E785 Hyperlipidemia, unspecified: Secondary | ICD-10-CM | POA: Diagnosis not present

## 2019-11-02 DIAGNOSIS — M810 Age-related osteoporosis without current pathological fracture: Secondary | ICD-10-CM | POA: Diagnosis not present

## 2019-11-02 DIAGNOSIS — Z0000 Encounter for general adult medical examination without abnormal findings: Secondary | ICD-10-CM | POA: Diagnosis not present

## 2019-11-03 ENCOUNTER — Encounter (HOSPITAL_COMMUNITY): Payer: Self-pay | Admitting: Nurse Practitioner

## 2019-11-03 ENCOUNTER — Ambulatory Visit (HOSPITAL_COMMUNITY)
Admission: RE | Admit: 2019-11-03 | Discharge: 2019-11-03 | Disposition: A | Discharge status: Home / Self Care | Payer: Medicare Other | Source: Ambulatory Visit | Attending: Nurse Practitioner | Admitting: Nurse Practitioner

## 2019-11-03 ENCOUNTER — Other Ambulatory Visit: Payer: Self-pay

## 2019-11-03 VITALS — BP 156/74 | HR 66 | Ht 68.0 in | Wt 204.4 lb

## 2019-11-03 DIAGNOSIS — I4819 Other persistent atrial fibrillation: Secondary | ICD-10-CM | POA: Diagnosis not present

## 2019-11-03 DIAGNOSIS — I4891 Unspecified atrial fibrillation: Secondary | ICD-10-CM | POA: Diagnosis not present

## 2019-11-03 DIAGNOSIS — Z87891 Personal history of nicotine dependence: Secondary | ICD-10-CM | POA: Diagnosis not present

## 2019-11-03 DIAGNOSIS — F329 Major depressive disorder, single episode, unspecified: Secondary | ICD-10-CM | POA: Insufficient documentation

## 2019-11-03 DIAGNOSIS — M810 Age-related osteoporosis without current pathological fracture: Secondary | ICD-10-CM | POA: Diagnosis not present

## 2019-11-03 DIAGNOSIS — I10 Essential (primary) hypertension: Secondary | ICD-10-CM | POA: Diagnosis not present

## 2019-11-03 DIAGNOSIS — I739 Peripheral vascular disease, unspecified: Secondary | ICD-10-CM | POA: Insufficient documentation

## 2019-11-03 DIAGNOSIS — G4733 Obstructive sleep apnea (adult) (pediatric): Secondary | ICD-10-CM | POA: Insufficient documentation

## 2019-11-03 DIAGNOSIS — Z8673 Personal history of transient ischemic attack (TIA), and cerebral infarction without residual deficits: Secondary | ICD-10-CM | POA: Insufficient documentation

## 2019-11-03 DIAGNOSIS — Z79899 Other long term (current) drug therapy: Secondary | ICD-10-CM | POA: Insufficient documentation

## 2019-11-03 DIAGNOSIS — I495 Sick sinus syndrome: Secondary | ICD-10-CM | POA: Diagnosis not present

## 2019-11-03 DIAGNOSIS — D6869 Other thrombophilia: Secondary | ICD-10-CM

## 2019-11-03 DIAGNOSIS — Z7901 Long term (current) use of anticoagulants: Secondary | ICD-10-CM | POA: Diagnosis not present

## 2019-11-03 LAB — MAGNESIUM: Magnesium: 2.2 mg/dL (ref 1.7–2.4)

## 2019-11-03 MED ORDER — DILTIAZEM HCL ER COATED BEADS 180 MG PO CP24: 180.0000 mg | ORAL_CAPSULE | Freq: Two times a day (BID) | ORAL | 3 refills | Status: DC

## 2019-11-03 NOTE — Addendum Note (Signed)
Encounter addended by: Sherran Needs, NP on: 11/03/2019 3:05 PM  Actions taken: Clinical Note Signed

## 2019-11-03 NOTE — Progress Notes (Addendum)
Primary Care Physician: Donald Prose, MD Referring Physician:Dr. Tora Duck Brittany [name] is a 86 y.o. female with a h/o afib, PPM, 2/2 sinus node dysfunction,  HTN, that is in the afib clinic for return of afib after seeing Dr. Caryl Comes and deciding to stop sotalol, per his note, since she had been maintianig SR and possible pro arrhythmia effect of the drug. . Per pt, she still felt she had a lot of afib on sotalol. Her last Paceart report showed 16% afib burden. She found out she was in afib as her husband noted an irregular pulse. She has noted mild increase in exertional dyspnea but no change in energy and she does not feel the afib. She does not have a drug supplement plan so has to pay for all her drugs out of pocket. Her BB dose was increased and she is rate controlled today.  She was hospitalized last week for tikosyn start and is now back for f/u, 9/11. She converted with the drug in the hospital but now is back in afib for the last 2 days. She did feel improved for 5 days. She is rate controlled.   F/u in afib clinic, 11/03/19 f/u for Tikosyn surveillance. She is in SR today. She states that she has not noted any afib but 24% burden on last Paceart reported in 2/21. She has noted her BP's have been running more in the mid [id number] systolic and asking if she needs more BP med. No issues with bleeding, continues on apixaban 5 mg bid with a CHA2DS2VASc score of 6.   Today, she denies symptoms of palpitations, chest pain, shortness of breath, orthopnea, PND, lower extremity edema, dizziness, presyncope, syncope, or neurologic sequela. The patient is tolerating medications without difficulties and is otherwise without complaint today.   Past Medical History:  Diagnosis Date  . Ankle fracture 2016  . Atrial fibrillation ([place])   . Depression   . Elevated parathyroid hormone   . Fracture of orbital floor with routine healing   . GERD (gastroesophageal reflux disease)   . History of colon polyps     benign  . Hypercalcemia   . Hypertension   . Hyponatremia   . Hypothyroid   . Macular degeneration    wet in the right and dry in the left  . OSA on CPAP   . Osteoporosis   . Peripheral vascular disease ([place])   . PSVT (paroxysmal supraventricular tachycardia) ([place])   . Rotator cuff tear   . Sinus node dysfunction ([facility])    a. s/p MDT pacemaker  . Stroke ([place])   . Urinary incontinence   . Wrist fracture 2018   Past Surgical History:  Procedure Laterality Date  . BACK SURGERY    . CATARACT EXTRACTION    . CATARACT EXTRACTION W/ INTRAOCULAR LENS  IMPLANT, BILATERAL Bilateral   . COLONOSCOPY    . ESOPHAGOGASTRODUODENOSCOPY    . IR ANGIO INTRA EXTRACRAN SEL COM CAROTID INNOMINATE BILAT MOD SED  12/30/2017  . IR ANGIO VERTEBRAL SEL VERTEBRAL BILAT MOD SED  12/30/2017  . LAPAROSCOPIC CHOLECYSTECTOMY  08/11/1999  . LUMBAR DISC SURGERY  02/2014  . ORIF ANKLE FRACTURE Left 04/13/2015   Procedure: OPEN REDUCTION INTERNAL FIXATION (ORIF) LEFT ANKLE FRACTURE;  Surgeon: Meredith Pel, MD;  Location: [facility];  Service: Orthopedics;  Laterality: Left;  . PACEMAKER PLACEMENT Right 2012   a. MDT dual chamber PPM implanted by Dr Caryl Comes   . SHOULDER ARTHROSCOPY W/ ROTATOR CUFF  REPAIR Right 08/30/2014   WITH MINI-OPEN ROTATOR CUFF REPAIR AND SUBACROMIAL DECOMPRESSION  . SHOULDER ARTHROSCOPY WITH ROTATOR CUFF REPAIR AND SUBACROMIAL DECOMPRESSION Right 08/30/2014   Procedure: SHOULDER ARTHROSCOPY WITH MINI-OPEN ROTATOR CUFF REPAIR AND SUBACROMIAL DECOMPRESSION;  Surgeon: Meredith Pel, MD;  Location: [facility];  Service: Orthopedics;  Laterality: Right;  RIGHT SHOULDER DIAGNOSTIC OPERATIVE ARTHROSCOPY, SUBACROMIAL DECOMPRESSION, MINI-OPEN ROTATOR CUFF REPAIR.  Marland Kitchen VAGINAL HYSTERECTOMY  1970    Current Outpatient Medications  Medication Sig Dispense Refill  . acetaminophen (TYLENOL) 500 MG tablet Take 500 mg by mouth 2 (two) times daily.     Marland Kitchen alendronate (FOSAMAX) 70 MG tablet Take 1 tablet (70 mg  total) by mouth every 7 (seven) days. Take with a full glass of water on an empty stomach. 4 tablet 11  . apixaban (ELIQUIS) 5 MG TABS tablet Take 1 tablet (5 mg total) by mouth 2 (two) times daily. 180 tablet 1  . atorvastatin (LIPITOR) 40 MG tablet Take 1 tablet (40 mg total) by mouth daily at 6 PM. 30 tablet 3  . B Complex-C (B-COMPLEX WITH VITAMIN C) tablet Take 1 tablet by mouth daily with lunch.     . Cholecalciferol (VITAMIN D3) 5000 UNITS CAPS Take 5,000 Units by mouth daily with breakfast.     . desmopressin (DDAVP) 0.2 MG tablet Take 0.2 mg by mouth daily as needed (to control bladder on car trip).     Marland Kitchen diltiazem (CARDIZEM CD) 120 MG 24 hr capsule Take 1 capsule (120 mg total) by mouth 2 (two) times daily. 180 capsule 3  . dofetilide (TIKOSYN) 500 MCG capsule TAKE ONE CAPSULE BY MOUTH TWICE DAILY  60 capsule 2  . lisinopril (PRINIVIL,ZESTRIL) 20 MG tablet Take 1 tablet (20 mg total) by mouth daily. 90 tablet 1  . Lutein 20 MG TABS Take 20 mg by mouth daily with breakfast.     . magnesium oxide (MAG-OX) 400 MG tablet Take 1 tablet (400 mg total) by mouth daily. 30 tablet 0  . metoprolol succinate (TOPROL-XL) 50 MG 24 hr tablet Take 2 tablets (100 mg total) by mouth 2 (two) times daily. Take with or immediately following a meal. 360 tablet 3  . Multiple Vitamin (MULTIVITAMIN WITH MINERALS) TABS tablet Take 1 tablet by mouth daily with breakfast.     . Multiple Vitamins-Minerals (PRESERVISION AREDS 2 PO) Take 1 capsule by mouth 2 (two) times daily with a meal.     . PRESCRIPTION MEDICATION Inhale into the lungs at bedtime. CPAP    . sertraline (ZOLOFT) 50 MG tablet Take 1 tablet (50 mg total) by mouth daily. 30 tablet 2   No current facility-administered medications for this encounter.    Allergies  Allergen Reactions  . Adhesive [Tape] Swelling and Rash  . Benzalkonium Chloride Rash    Pt was not aware of this allergy  . Neosporin [Neomycin-Polymyxin-Gramicidin] Swelling and Rash     Social History   Socioeconomic History  . Marital status: Married    Spouse name: Not on file  . Number of children: 4  . Years of education: 83  . Highest education level: Not on file  Occupational History  . Occupation: Clinical research associate     Comment: retired  Tobacco Use  . Smoking status: Former Smoker    Packs/day: 1.00    Years: 45.00    Pack years: 45.00    Types: Cigarettes  . Smokeless tobacco: Never Used  . Tobacco comment: quit smoking in 1992  Substance and  Sexual Activity  . Alcohol use: Yes    Alcohol/week: 28.0 standard drinks    Types: 28 Glasses of wine per week    Comment: 08/30/2014 "16oz wine q night"  . Drug use: No  . Sexual activity: Yes    Birth control/protection: Surgical  Other Topics Concern  . Not on file  Social History Narrative   Ms. Ravel lives with her husband at [employer]. They are retired and recently relocated to [place] from [place]. She has 4 grown children.    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   Social Determinants of Health   Financial Resource Strain:   . Difficulty of Paying Living Expenses:   Food Insecurity:   . Worried About Charity fundraiser in the Last Year:   . Arboriculturist in the Last Year:   Transportation Needs:   . Film/video editor (Medical):   Marland Kitchen Lack of Transportation (Non-Medical):   Physical Activity:   . Days of Exercise per Week:   . Minutes of Exercise per Session:   Stress:   . Feeling of Stress :   Social Connections:   . Frequency of Communication with Friends and Family:   . Frequency of Social Gatherings with Friends and Family:   . Attends Religious Services:   . Active Member of Clubs or Organizations:   . Attends Archivist Meetings:   Marland Kitchen Marital Status:   Intimate Partner Violence:   . Fear of Current or Ex-Partner:   . Emotionally Abused:   Marland Kitchen Physically Abused:   . Sexually Abused:     Family History  Problem Relation  Age of Onset  . Colon cancer Mother        colon  . Cancer Father        unknown type  . Stroke Sister   . Skin cancer Brother   . Heart disease Brother   . Breast cancer Paternal Aunt   . Asthma Brother     ROS- All systems are reviewed and negative except as per the HPI above  Physical Exam: Vitals:   11/03/19 1333  BP: (!) 156/74  Pulse: 66  Weight: 92.7 kg  Height: 5\' 8"  (1.727 m)   Wt Readings from Last 3 Encounters:  11/03/19 92.7 kg  05/26/19 91.5 kg  04/30/19 91 kg    Labs: Lab Results  Component Value Date   NA 134 (L) 04/02/2019   K 4.9 04/02/2019   CL 100 04/02/2019   CO2 24 04/02/2019   GLUCOSE 105 (H) 04/02/2019   BUN 8 04/02/2019   CREATININE 0.63 04/02/2019   CALCIUM 10.3 04/02/2019   MG 2.0 04/02/2019   Lab Results  Component Value Date   INR 0.99 12/30/2017   Lab Results  Component Value Date   CHOL 163 08/14/2018   HDL 69 08/14/2018   LDLCALC 67 08/14/2018   TRIG 133 08/14/2018     GEN- The patient is well appearing, alert and oriented x 3 today.   Head- normocephalic, atraumatic Eyes-  Sclera clear, conjunctiva pink Ears- hearing intact Oropharynx- clear Neck- supple, no JVP Lymph- no cervical lymphadenopathy Lungs- Clear to ausculation bilaterally, normal work of breathing Heart-  regular rate and rhythm, no murmurs, rubs or gallops, PMI not laterally displaced GI- soft, NT, ND, + BS Extremities- no clubbing, cyanosis, or edema MS- no significant deformity or atrophy Skin- no rash or lesion Psych- euthymic mood, full affect Neuro-  strength and sensation are intact  EKG- a paced rhythm at 66 bpm, pr int 250 ms, qrs int 112 ms, qtc 478 ms  Epic records reviewed   Assessment and Plan: 1. Persistent  afib Failed sotalol, now on tikosyn Feels she is not having any afib but 24% burden reported out on last paceart Is taking dofetilide correctly without missed doses Continues on Eliquis 5 mg bid for CHA2DS2VASc score of 6 mag  today, bmet yesterday at PCP office, will obtain   2. HTN Not optimal Will increase diltiazem to 180 mg bid,  this will address BP as well as hopefully diminish afib burden   F/u with Dr. Caryl Comes in October as scheduled   Addendum: Labs reviewed from PCP, bmet shows creatinine of 0.66, K+ of 4.3, BUN 11, CBC with RBC at 4.10, H/H at 13/38.5, Plts at [place]. Michaelann Gunnoe, [facility] [address] [place], [place] [address] [phone]

## 2019-11-03 NOTE — Patient Instructions (Signed)
Increase cardizem to 180mg twice a day 

## 2019-11-04 ENCOUNTER — Other Ambulatory Visit: Payer: Self-pay | Admitting: Family Medicine

## 2019-11-04 DIAGNOSIS — M810 Age-related osteoporosis without current pathological fracture: Secondary | ICD-10-CM

## 2019-11-08 DIAGNOSIS — M1712 Unilateral primary osteoarthritis, left knee: Secondary | ICD-10-CM | POA: Diagnosis not present

## 2019-11-08 DIAGNOSIS — M1711 Unilateral primary osteoarthritis, right knee: Secondary | ICD-10-CM | POA: Diagnosis not present

## 2019-11-10 DIAGNOSIS — R7309 Other abnormal glucose: Secondary | ICD-10-CM | POA: Diagnosis not present

## 2019-11-15 ENCOUNTER — Other Ambulatory Visit (HOSPITAL_COMMUNITY): Payer: Self-pay | Admitting: Interventional Radiology

## 2019-11-15 ENCOUNTER — Telehealth (HOSPITAL_COMMUNITY): Payer: Self-pay

## 2019-11-15 DIAGNOSIS — I771 Stricture of artery: Secondary | ICD-10-CM

## 2019-11-15 NOTE — Telephone Encounter (Signed)
Called to schedule us carotid, no answer, left vm. AW  

## 2019-11-17 ENCOUNTER — Ambulatory Visit (HOSPITAL_COMMUNITY)
Admission: RE | Admit: 2019-11-17 | Discharge: 2019-11-17 | Disposition: A | Discharge status: Home / Self Care | Payer: Medicare Other | Source: Ambulatory Visit | Attending: Interventional Radiology | Admitting: Interventional Radiology

## 2019-11-17 ENCOUNTER — Other Ambulatory Visit: Payer: Self-pay

## 2019-11-17 DIAGNOSIS — M810 Age-related osteoporosis without current pathological fracture: Secondary | ICD-10-CM | POA: Diagnosis not present

## 2019-11-17 DIAGNOSIS — I4891 Unspecified atrial fibrillation: Secondary | ICD-10-CM | POA: Diagnosis not present

## 2019-11-17 DIAGNOSIS — I10 Essential (primary) hypertension: Secondary | ICD-10-CM | POA: Diagnosis not present

## 2019-11-17 DIAGNOSIS — I771 Stricture of artery: Secondary | ICD-10-CM | POA: Diagnosis not present

## 2019-11-17 DIAGNOSIS — E785 Hyperlipidemia, unspecified: Secondary | ICD-10-CM | POA: Diagnosis not present

## 2019-11-17 NOTE — Progress Notes (Signed)
Carotid duplex has been completed.   Preliminary results in CV Proc.   Abram Sander 11/17/2019 10:31 AM

## 2019-11-19 ENCOUNTER — Telehealth (HOSPITAL_COMMUNITY): Payer: Self-pay | Admitting: Radiology

## 2019-11-19 DIAGNOSIS — I10 Essential (primary) hypertension: Secondary | ICD-10-CM | POA: Diagnosis not present

## 2019-11-19 NOTE — Telephone Encounter (Signed)
Called pt, left VM that she will be due for her next US carotids in 6 month's time. She is to call back if she has any questions. JM

## 2019-11-29 DIAGNOSIS — M47816 Spondylosis without myelopathy or radiculopathy, lumbar region: Secondary | ICD-10-CM | POA: Diagnosis not present

## 2019-11-29 DIAGNOSIS — M545 Low back pain: Secondary | ICD-10-CM | POA: Diagnosis not present

## 2019-12-01 ENCOUNTER — Telehealth: Payer: Self-pay

## 2019-12-01 ENCOUNTER — Ambulatory Visit (INDEPENDENT_AMBULATORY_CARE_PROVIDER_SITE_OTHER): Payer: Medicare Other | Admitting: *Deleted

## 2019-12-01 DIAGNOSIS — I4819 Other persistent atrial fibrillation: Secondary | ICD-10-CM

## 2019-12-01 DIAGNOSIS — I495 Sick sinus syndrome: Secondary | ICD-10-CM | POA: Diagnosis not present

## 2019-12-01 NOTE — Telephone Encounter (Signed)
Spoke with patient to remind of missed remote transmission 

## 2019-12-02 LAB — CUP PACEART REMOTE DEVICE CHECK
Battery Impedance: 2284 Ohm
Battery Remaining Longevity: 27 mo
Battery Voltage: 2.74 V
Brady Statistic AP VP Percent: 2 %
Brady Statistic AP VS Percent: 76 %
Brady Statistic AS VP Percent: 8 %
Brady Statistic AS VS Percent: 15 %
Date Time Interrogation Session: 20210512152706
Implantable Lead Implant Date: 20121213
Implantable Lead Implant Date: 20121213
Implantable Lead Location: 753859
Implantable Lead Location: 753860
Implantable Lead Model: 4076
Implantable Lead Model: 4076
Implantable Lead Serial Number: 835025
Implantable Lead Serial Number: 869269
Implantable Pulse Generator Implant Date: 20121213
Lead Channel Impedance Value: 388 Ohm
Lead Channel Impedance Value: 520 Ohm
Lead Channel Pacing Threshold Amplitude: 0.625 V
Lead Channel Pacing Threshold Amplitude: 0.625 V
Lead Channel Pacing Threshold Pulse Width: 0.4 ms
Lead Channel Pacing Threshold Pulse Width: 0.4 ms
Lead Channel Setting Pacing Amplitude: 2 V
Lead Channel Setting Pacing Amplitude: 2.5 V
Lead Channel Setting Pacing Pulse Width: 0.4 ms
Lead Channel Setting Sensing Sensitivity: 4 mV

## 2019-12-03 NOTE — Progress Notes (Signed)
Remote pacemaker transmission.   

## 2019-12-07 DIAGNOSIS — M47816 Spondylosis without myelopathy or radiculopathy, lumbar region: Secondary | ICD-10-CM | POA: Diagnosis not present

## 2019-12-17 DIAGNOSIS — I10 Essential (primary) hypertension: Secondary | ICD-10-CM | POA: Diagnosis not present

## 2020-01-05 DIAGNOSIS — M545 Low back pain: Secondary | ICD-10-CM | POA: Diagnosis not present

## 2020-01-05 DIAGNOSIS — M47816 Spondylosis without myelopathy or radiculopathy, lumbar region: Secondary | ICD-10-CM | POA: Diagnosis not present

## 2020-01-14 ENCOUNTER — Other Ambulatory Visit: Payer: Self-pay

## 2020-01-14 ENCOUNTER — Ambulatory Visit
Admission: RE | Admit: 2020-01-14 | Discharge: 2020-01-14 | Disposition: A | Discharge status: Home / Self Care | Payer: Medicare Other | Source: Ambulatory Visit | Attending: Family Medicine | Admitting: Family Medicine

## 2020-01-14 DIAGNOSIS — M810 Age-related osteoporosis without current pathological fracture: Secondary | ICD-10-CM

## 2020-01-17 ENCOUNTER — Other Ambulatory Visit (HOSPITAL_COMMUNITY): Payer: Self-pay | Admitting: Nurse Practitioner

## 2020-02-28 DIAGNOSIS — L03114 Cellulitis of left upper limb: Secondary | ICD-10-CM | POA: Diagnosis not present

## 2020-03-01 ENCOUNTER — Ambulatory Visit (INDEPENDENT_AMBULATORY_CARE_PROVIDER_SITE_OTHER): Payer: Medicare Other | Admitting: *Deleted

## 2020-03-01 DIAGNOSIS — L03114 Cellulitis of left upper limb: Secondary | ICD-10-CM | POA: Diagnosis not present

## 2020-03-01 DIAGNOSIS — I4819 Other persistent atrial fibrillation: Secondary | ICD-10-CM

## 2020-03-01 LAB — CUP PACEART REMOTE DEVICE CHECK
Battery Impedance: 2449 Ohm
Battery Remaining Longevity: 25 mo
Battery Voltage: 2.73 V
Brady Statistic AP VP Percent: 2 %
Brady Statistic AP VS Percent: 79 %
Brady Statistic AS VP Percent: 7 %
Brady Statistic AS VS Percent: 13 %
Date Time Interrogation Session: 20210811083241
Implantable Lead Implant Date: 20121213
Implantable Lead Implant Date: 20121213
Implantable Lead Location: 753859
Implantable Lead Location: 753860
Implantable Lead Model: 4076
Implantable Lead Model: 4076
Implantable Lead Serial Number: 835025
Implantable Lead Serial Number: 869269
Implantable Pulse Generator Implant Date: 20121213
Lead Channel Impedance Value: 380 Ohm
Lead Channel Impedance Value: 510 Ohm
Lead Channel Pacing Threshold Amplitude: 0.625 V
Lead Channel Pacing Threshold Amplitude: 0.75 V
Lead Channel Pacing Threshold Pulse Width: 0.4 ms
Lead Channel Pacing Threshold Pulse Width: 0.4 ms
Lead Channel Setting Pacing Amplitude: 2 V
Lead Channel Setting Pacing Amplitude: 2.5 V
Lead Channel Setting Pacing Pulse Width: 0.4 ms
Lead Channel Setting Sensing Sensitivity: 5.6 mV

## 2020-03-03 DIAGNOSIS — L03114 Cellulitis of left upper limb: Secondary | ICD-10-CM | POA: Diagnosis not present

## 2020-03-06 NOTE — Progress Notes (Signed)
Remote pacemaker transmission.   

## 2020-03-07 DIAGNOSIS — L03114 Cellulitis of left upper limb: Secondary | ICD-10-CM | POA: Diagnosis not present

## 2020-03-07 DIAGNOSIS — M7989 Other specified soft tissue disorders: Secondary | ICD-10-CM | POA: Diagnosis not present

## 2020-03-08 DIAGNOSIS — M1711 Unilateral primary osteoarthritis, right knee: Secondary | ICD-10-CM | POA: Diagnosis not present

## 2020-03-08 DIAGNOSIS — M1712 Unilateral primary osteoarthritis, left knee: Secondary | ICD-10-CM | POA: Diagnosis not present

## 2020-03-08 DIAGNOSIS — M170 Bilateral primary osteoarthritis of knee: Secondary | ICD-10-CM | POA: Diagnosis not present

## 2020-03-20 DIAGNOSIS — M25522 Pain in left elbow: Secondary | ICD-10-CM | POA: Diagnosis not present

## 2020-03-20 DIAGNOSIS — L03114 Cellulitis of left upper limb: Secondary | ICD-10-CM | POA: Diagnosis not present

## 2020-03-20 DIAGNOSIS — M7989 Other specified soft tissue disorders: Secondary | ICD-10-CM | POA: Diagnosis not present

## 2020-04-11 DIAGNOSIS — L03114 Cellulitis of left upper limb: Secondary | ICD-10-CM | POA: Diagnosis not present

## 2020-04-12 ENCOUNTER — Other Ambulatory Visit: Payer: Self-pay | Admitting: Family Medicine

## 2020-04-12 ENCOUNTER — Telehealth: Payer: Self-pay | Admitting: Internal Medicine

## 2020-04-12 DIAGNOSIS — I5032 Chronic diastolic (congestive) heart failure: Secondary | ICD-10-CM | POA: Insufficient documentation

## 2020-04-12 DIAGNOSIS — R001 Bradycardia, unspecified: Secondary | ICD-10-CM | POA: Insufficient documentation

## 2020-04-12 DIAGNOSIS — I5030 Unspecified diastolic (congestive) heart failure: Secondary | ICD-10-CM | POA: Insufficient documentation

## 2020-04-12 DIAGNOSIS — Z1231 Encounter for screening mammogram for malignant neoplasm of breast: Secondary | ICD-10-CM

## 2020-04-12 NOTE — Telephone Encounter (Signed)
Pt c/o Shortness Of Breath: STAT if SOB developed within the last 24 hours or pt is noticeably SOB on the phone  1. Are you currently SOB (can you hear that pt is SOB on the phone)? Yes and yes   2. How long have you been experiencing SOB? About week  3. Are you SOB when sitting or when up moving around? moving  4. Are you currently experiencing any other symptoms? Week, sweating

## 2020-04-12 NOTE — Telephone Encounter (Signed)
Manual transmission received and reviewed on 04/12/20. Presenting AP/VS 90, appears to have increased amount of AHR's with elevated VR starting on 04/07/20. Longest episode 3 hours 6 minutes, AT 196 bpm with elevated VR 102 bpm.

## 2020-04-12 NOTE — Telephone Encounter (Signed)
Spoke with pt who is complaining of worsening SOB x 3-4 weeks on exertion with diaphoresis.  Pt denies current CP, SOB, edema or dizziness.  Pt reports taking medications as prescribed no diuretic prescribed.  Pt has PPM/MDT and f/u appointment with Dr Caryl Comes on 05/02/2020. Requested pt send transmission for review by device RN.  ED precautions reviewed with pt.  Pt verbalizes understanding and agrees with current plan.

## 2020-04-12 NOTE — Telephone Encounter (Signed)
Spoke with pt and scheduled virtual appointment for 04/13/2020 at 315pm.  Pt verbalizes understanding and agrees with current plan.

## 2020-04-12 NOTE — Telephone Encounter (Signed)
Can we arrange to see her telehealth visit  tomorrow Thanks SK

## 2020-04-12 NOTE — Telephone Encounter (Signed)
Message sent to scheduler requesting this.

## 2020-04-13 ENCOUNTER — Telehealth (INDEPENDENT_AMBULATORY_CARE_PROVIDER_SITE_OTHER): Payer: Medicare Other | Admitting: Internal Medicine

## 2020-04-13 ENCOUNTER — Other Ambulatory Visit: Payer: Self-pay

## 2020-04-13 ENCOUNTER — Telehealth: Payer: Self-pay

## 2020-04-13 VITALS — BP 144/68 | HR 85 | Ht 68.0 in | Wt 206.0 lb

## 2020-04-13 DIAGNOSIS — I5030 Unspecified diastolic (congestive) heart failure: Secondary | ICD-10-CM | POA: Diagnosis not present

## 2020-04-13 DIAGNOSIS — Z950 Presence of cardiac pacemaker: Secondary | ICD-10-CM | POA: Diagnosis not present

## 2020-04-13 DIAGNOSIS — R001 Bradycardia, unspecified: Secondary | ICD-10-CM | POA: Diagnosis not present

## 2020-04-13 DIAGNOSIS — I4819 Other persistent atrial fibrillation: Secondary | ICD-10-CM | POA: Diagnosis not present

## 2020-04-13 NOTE — Patient Instructions (Signed)
Medication Instructions:  Your physician recommends that you continue on your current medications as directed. Please refer to the Current Medication list given to you today.  *If you need a refill on your cardiac medications before your next appointment, please call your pharmacy*   Lab Work: None ordered.  If you have labs (blood work) drawn today and your tests are completely normal, you will receive your results only by:  [place] (if you have MyChart) OR  A paper copy in the mail If you have any lab test that is abnormal or we need to change your treatment, we will call you to review the results.   Testing/Procedures: None ordered.    Follow-Up: At [facility], you and your health needs are our priority.  As part of our continuing mission to provide you with exceptional heart care, we have created designated Provider Care Teams.  These Care Teams include your primary Cardiologist (physician) and Advanced Practice Providers (APPs -  Physician Assistants and Nurse Practitioners) who all work together to provide you with the care you need, when you need it.  We recommend signing up for the patient portal called "MyChart".  Sign up information is provided on this After Visit Summary.  MyChart is used to connect with patients for Virtual Visits (Telemedicine).  Patients are able to view lab/test results, encounter notes, upcoming appointments, etc.  Non-urgent messages can be sent to your provider as well.   To learn more about what you can do with MyChart, go to [email or web address].    Your next appointment:  Virtual appointment with Dr Caryl Comes 04/20/2020 at 230pm

## 2020-04-13 NOTE — Progress Notes (Signed)
Electrophysiology TeleHealth Note   Due to national recommendations of social distancing due to COVID 19, an audio/video telehealth visit is felt to be most appropriate for this patient at this time.  See MyChart message from today for the patient's consent to telehealth for [facility].   Date:  04/13/2020   ID:  Brittany [name], DOB 09-Aug-1933, MRN [id number]  Location: patient's home  Provider location: [address], [place]  Evaluation Performed: Follow-up visit  PCP:  Donald Prose, MD  Cardiologist:     Electrophysiologist:  SK   Chief Complaint:  Atrial fib  History of Present Illness:    Brittany [name] is a 86 y.o. female who presents via audio/video conferencing for a telehealth visit today.  Since last being seen in our clinic for atrial fib, previoulsy on sotalol and now on dofetilide, sinus bradycardia,s/p pacemaker- MDT, the patient reports more dyspnea and diaphoresis.   Recently started on metoprolol and xartia by PCP for BP     DATE TEST EF   8/12  Myoview  Normal LV No ischemia  6/19 Echo   55-60 %                Thromboembolic risk factors include age-67 hypertension-1 for a CHADS- score of 2 and a CHADS-VASc score of 4     Date Cr K Mg Hgb   5/17   1.9   1/18  0.59 4.5   14.7  11/18 0.55 4.4  15.8  3/20 0.61 4.4 2.1 12.9  9/20 0.63 4.9 2.0   8/21    13.0      The patient denies symptoms of fevers, chills, cough, or new SOB worrisome for COVID 19   Past Medical History:  Diagnosis Date  . Ankle fracture 2016  . Atrial fibrillation ([place])   . Depression   . Elevated parathyroid hormone   . Fracture of orbital floor with routine healing   . GERD (gastroesophageal reflux disease)   . History of colon polyps    benign  . Hypercalcemia   . Hypertension   . Hyponatremia   . Hypothyroid   . Macular degeneration    wet in the right and dry in the left  . OSA on CPAP   . Osteoporosis   .  Peripheral vascular disease ([place])   . PSVT (paroxysmal supraventricular tachycardia) ([place])   . Rotator cuff tear   . Sinus node dysfunction ([facility])    a. s/p MDT pacemaker  . Stroke ([place])   . Urinary incontinence   . Wrist fracture 2018    Past Surgical History:  Procedure Laterality Date  . BACK SURGERY    . CATARACT EXTRACTION    . CATARACT EXTRACTION W/ INTRAOCULAR LENS  IMPLANT, BILATERAL Bilateral   . COLONOSCOPY    . ESOPHAGOGASTRODUODENOSCOPY    . IR ANGIO INTRA EXTRACRAN SEL COM CAROTID INNOMINATE BILAT MOD SED  12/30/2017  . IR ANGIO VERTEBRAL SEL VERTEBRAL BILAT MOD SED  12/30/2017  . LAPAROSCOPIC CHOLECYSTECTOMY  08/11/1999  . LUMBAR DISC SURGERY  02/2014  . ORIF ANKLE FRACTURE Left 04/13/2015   Procedure: OPEN REDUCTION INTERNAL FIXATION (ORIF) LEFT ANKLE FRACTURE;  Surgeon: Meredith Pel, MD;  Location: [facility];  Service: Orthopedics;  Laterality: Left;  . PACEMAKER PLACEMENT Right 2012   a. MDT dual chamber PPM implanted by Dr Caryl Comes   . SHOULDER ARTHROSCOPY W/ ROTATOR CUFF REPAIR Right 08/30/2014   WITH MINI-OPEN ROTATOR CUFF  REPAIR AND SUBACROMIAL DECOMPRESSION  . SHOULDER ARTHROSCOPY WITH ROTATOR CUFF REPAIR AND SUBACROMIAL DECOMPRESSION Right 08/30/2014   Procedure: SHOULDER ARTHROSCOPY WITH MINI-OPEN ROTATOR CUFF REPAIR AND SUBACROMIAL DECOMPRESSION;  Surgeon: Meredith Pel, MD;  Location: [facility];  Service: Orthopedics;  Laterality: Right;  RIGHT SHOULDER DIAGNOSTIC OPERATIVE ARTHROSCOPY, SUBACROMIAL DECOMPRESSION, MINI-OPEN ROTATOR CUFF REPAIR.  Marland Kitchen VAGINAL HYSTERECTOMY  1970    Current Outpatient Medications  Medication Sig Dispense Refill  . acetaminophen (TYLENOL) 500 MG tablet Take 500 mg by mouth 2 (two) times daily.     Marland Kitchen alendronate (FOSAMAX) 70 MG tablet Take 1 tablet (70 mg total) by mouth every 7 (seven) days. Take with a full glass of water on an empty stomach. 4 tablet 11  . desmopressin (DDAVP) 0.2 MG tablet Take 0.2 mg by mouth daily as needed (to  control bladder on car trip).     Marland Kitchen diltiazem (CARDIZEM CD) 180 MG 24 hr capsule Take 1 capsule (180 mg total) by mouth 2 (two) times daily. 180 capsule 3  . dofetilide (TIKOSYN) 500 MCG capsule TAKE ONE CAPSULE BY MOUTH TWICE DAILY 60 capsule 2  . lisinopril (PRINIVIL,ZESTRIL) 20 MG tablet Take 1 tablet (20 mg total) by mouth daily. 90 tablet 1  . Lutein 20 MG TABS Take 20 mg by mouth daily with breakfast.     . magnesium oxide (MAG-OX) 400 MG tablet Take 1 tablet (400 mg total) by mouth daily. 30 tablet 0  . metoprolol succinate (TOPROL-XL) 50 MG 24 hr tablet Take 2 tablets (100 mg total) by mouth 2 (two) times daily. Take with or immediately following a meal. 360 tablet 3  . Multiple Vitamin (MULTIVITAMIN WITH MINERALS) TABS tablet Take 1 tablet by mouth daily with breakfast.     . Multiple Vitamins-Minerals (PRESERVISION AREDS 2 PO) Take 1 capsule by mouth 2 (two) times daily with a meal.     . PRESCRIPTION MEDICATION Inhale into the lungs at bedtime. CPAP    . apixaban (ELIQUIS) 5 MG TABS tablet Take 1 tablet (5 mg total) by mouth 2 (two) times daily. 180 tablet 1  . atorvastatin (LIPITOR) 40 MG tablet Take 1 tablet (40 mg total) by mouth daily at 6 PM. 30 tablet 3  . B Complex-C (B-COMPLEX WITH VITAMIN C) tablet Take 1 tablet by mouth daily with lunch.     . Cholecalciferol (VITAMIN D3) 5000 UNITS CAPS Take 5,000 Units by mouth daily with breakfast.     . sertraline (ZOLOFT) 50 MG tablet Take 1 tablet (50 mg total) by mouth daily. 30 tablet 2   No current facility-administered medications for this visit.    Allergies:   Adhesive [tape], Benzalkonium chloride, and Neosporin [neomycin-polymyxin-gramicidin]   Social History:  The patient  reports that she has quit smoking. Her smoking use included cigarettes. She has a 45.00 pack-year smoking history. She has never used smokeless tobacco. She reports current alcohol use of about 28.0 standard drinks of alcohol per week. She reports that she  does not use drugs.   Family History:  The patient's   family history includes Asthma in her brother; Breast cancer in her paternal aunt; Cancer in her father; Colon cancer in her mother; Heart disease in her brother; Skin cancer in her brother; Stroke in her sister.   ROS:  Please see the history of present illness.   All other systems are personally reviewed and negative.    Exam:    Vital Signs:  BP (!) 144/68   Pulse  85   Ht 5\' 8"  (1.727 m)   Wt 206 lb (93.4 kg)   LMP 07/22/1968   BMI 31.32 kg/m     Labs/Other Tests and Data Reviewed:    Recent Labs: 11/03/2019: Magnesium 2.2   Wt Readings from Last 3 Encounters:  04/13/20 206 lb (93.4 kg)  11/03/19 204 lb 6.4 oz (92.7 kg)  05/26/19 201 lb 12.8 oz (91.5 kg)     Other studies personally reviewed:    Last device remote is reviewed from [facility] PDF dated 9/21 which reveals normal device function,   arrhythmias - increased burden of atrial fib   ASSESSMENT & PLAN:   Atrial fib  Increasing burden   Sinus brady  DOE  High Risk Medication Surveillance dofetilide ] Hypertension    Increasing dyspnea with diaphoresis but without edema or abd distension supports concern that symptoms may be 2/2 afib  However her pulse ox measurements have had recent numbers also in the 60s which I would not have expected from her atrial arrhythmia, and would expect those numbers to be 75+    The higher doses of the BB and CCB by her PCP may mask the presence of the atrial rhythm as estimated by ventricular rate   She will try and be more attentive over the next week and will plan reinterrogation next week and further discussion If she is failing dofetilide could consider adjunctive ranolazine or switching to amiodarone  BP is better     COVID 19 screen The patient denies symptoms of COVID 19 at this time.  The importance of social distancing was discussed today.  Follow-up:  Thursday 9/30 Next remote: *next Wednesday   Current  medicines are reviewed at length with the patient today.   The patient does not have concerns regarding her medicines.  The following changes were made today:  none  Labs/ tests ordered today include:   No orders of the defined types were placed in this encounter.   Future tests ( post COVID )     Patient Risk:  after full review of this patients clinical status, I feel that they are at moderate risk at this time.  Today, I have spent  10 minutes with the patient with telehealth technology discussing the above.  Signed, Virl Axe, MD  04/13/2020 3:13 PM     [address] [place] [address] [phone] (office) [phone] (fax)

## 2020-04-13 NOTE — Telephone Encounter (Signed)
  Patient Consent for Virtual Visit         Brittany [name] has provided verbal consent on 04/13/2020 for a virtual visit (video or telephone).   CONSENT FOR VIRTUAL VISIT FOR:  Brittany [name]  By participating in this virtual visit I agree to the following:  I hereby voluntarily request, consent and authorize [facility] and its employed or contracted physicians, physician assistants, nurse practitioners or other licensed health care professionals (the Practitioner), to provide me with telemedicine health care services (the "Services") as deemed necessary by the treating Practitioner. I acknowledge and consent to receive the Services by the Practitioner via telemedicine. I understand that the telemedicine visit will involve communicating with the Practitioner through live audiovisual communication technology and the disclosure of certain medical information by electronic transmission. I acknowledge that I have been given the opportunity to request an in-person assessment or other available alternative prior to the telemedicine visit and am voluntarily participating in the telemedicine visit.  I understand that I have the right to withhold or withdraw my consent to the use of telemedicine in the course of my care at any time, without affecting my right to future care or treatment, and that the Practitioner or I may terminate the telemedicine visit at any time. I understand that I have the right to inspect all information obtained and/or recorded in the course of the telemedicine visit and may receive copies of available information for a reasonable fee.  I understand that some of the potential risks of receiving the Services via telemedicine include:  Marland Kitchen Delay or interruption in medical evaluation due to technological equipment failure or disruption; . Information transmitted may not be sufficient (e.g. poor resolution of images) to allow for appropriate medical decision making by the  Practitioner; and/or  . In rare instances, security protocols could fail, causing a breach of personal health information.  Furthermore, I acknowledge that it is my responsibility to provide information about my medical history, conditions and care that is complete and accurate to the best of my ability. I acknowledge that Practitioner's advice, recommendations, and/or decision may be based on factors not within their control, such as incomplete or inaccurate data provided by me or distortions of diagnostic images or specimens that may result from electronic transmissions. I understand that the practice of medicine is not an exact science and that Practitioner makes no warranties or guarantees regarding treatment outcomes. I acknowledge that a copy of this consent can be made available to me via my patient portal ([facility]), or I can request a printed copy by calling the office of [facility].    I understand that my insurance will be billed for this visit.   I have read or had this consent read to me. . I understand the contents of this consent, which adequately explains the benefits and risks of the Services being provided via telemedicine.  . I have been provided ample opportunity to ask questions regarding this consent and the Services and have had my questions answered to my satisfaction. . I give my informed consent for the services to be provided through the use of telemedicine in my medical care

## 2020-04-16 ENCOUNTER — Other Ambulatory Visit (HOSPITAL_COMMUNITY): Payer: Self-pay | Admitting: Nurse Practitioner

## 2020-04-16 ENCOUNTER — Other Ambulatory Visit: Payer: Self-pay | Admitting: Internal Medicine

## 2020-04-18 DIAGNOSIS — M545 Low back pain: Secondary | ICD-10-CM | POA: Diagnosis not present

## 2020-04-18 DIAGNOSIS — M9902 Segmental and somatic dysfunction of thoracic region: Secondary | ICD-10-CM | POA: Diagnosis not present

## 2020-04-18 DIAGNOSIS — E785 Hyperlipidemia, unspecified: Secondary | ICD-10-CM | POA: Diagnosis not present

## 2020-04-18 DIAGNOSIS — M810 Age-related osteoporosis without current pathological fracture: Secondary | ICD-10-CM | POA: Diagnosis not present

## 2020-04-18 DIAGNOSIS — I10 Essential (primary) hypertension: Secondary | ICD-10-CM | POA: Diagnosis not present

## 2020-04-18 DIAGNOSIS — M546 Pain in thoracic spine: Secondary | ICD-10-CM | POA: Diagnosis not present

## 2020-04-18 DIAGNOSIS — M9903 Segmental and somatic dysfunction of lumbar region: Secondary | ICD-10-CM | POA: Diagnosis not present

## 2020-04-18 DIAGNOSIS — I4891 Unspecified atrial fibrillation: Secondary | ICD-10-CM | POA: Diagnosis not present

## 2020-04-19 ENCOUNTER — Ambulatory Visit (INDEPENDENT_AMBULATORY_CARE_PROVIDER_SITE_OTHER): Payer: Medicare Other | Admitting: Emergency Medicine

## 2020-04-19 DIAGNOSIS — R001 Bradycardia, unspecified: Secondary | ICD-10-CM

## 2020-04-20 ENCOUNTER — Other Ambulatory Visit: Payer: Self-pay

## 2020-04-20 ENCOUNTER — Telehealth (INDEPENDENT_AMBULATORY_CARE_PROVIDER_SITE_OTHER): Payer: Medicare Other | Admitting: Internal Medicine

## 2020-04-20 ENCOUNTER — Telehealth: Payer: Self-pay | Admitting: Internal Medicine

## 2020-04-20 VITALS — BP 152/85 | HR 70 | Ht 68.0 in | Wt 204.0 lb

## 2020-04-20 DIAGNOSIS — R001 Bradycardia, unspecified: Secondary | ICD-10-CM | POA: Diagnosis not present

## 2020-04-20 DIAGNOSIS — Z950 Presence of cardiac pacemaker: Secondary | ICD-10-CM

## 2020-04-20 DIAGNOSIS — I4891 Unspecified atrial fibrillation: Secondary | ICD-10-CM | POA: Diagnosis not present

## 2020-04-20 LAB — CUP PACEART REMOTE DEVICE CHECK
Battery Impedance: 2646 Ohm
Battery Remaining Longevity: 23 mo
Battery Voltage: 2.74 V
Brady Statistic AP VP Percent: 2 %
Brady Statistic AP VS Percent: 77 %
Brady Statistic AS VP Percent: 7 %
Brady Statistic AS VS Percent: 14 %
Date Time Interrogation Session: 20210929091833
Implantable Lead Implant Date: 20121213
Implantable Lead Implant Date: 20121213
Implantable Lead Location: 753859
Implantable Lead Location: 753860
Implantable Lead Model: 4076
Implantable Lead Model: 4076
Implantable Lead Serial Number: 835025
Implantable Lead Serial Number: 869269
Implantable Pulse Generator Implant Date: 20121213
Lead Channel Impedance Value: 369 Ohm
Lead Channel Impedance Value: 478 Ohm
Lead Channel Pacing Threshold Amplitude: 0.625 V
Lead Channel Pacing Threshold Amplitude: 0.75 V
Lead Channel Pacing Threshold Pulse Width: 0.4 ms
Lead Channel Pacing Threshold Pulse Width: 0.4 ms
Lead Channel Setting Pacing Amplitude: 2 V
Lead Channel Setting Pacing Amplitude: 2.5 V
Lead Channel Setting Pacing Pulse Width: 0.4 ms
Lead Channel Setting Sensing Sensitivity: 4 mV

## 2020-04-20 MED ORDER — FUROSEMIDE 20 MG PO TABS: 20.0000 mg | ORAL_TABLET | Freq: Every day | ORAL | 0 refills | Status: DC

## 2020-04-20 NOTE — Telephone Encounter (Signed)
Pt's medication was resent to pt's pharmacy as requested. Confirmation received.  °

## 2020-04-20 NOTE — Patient Instructions (Addendum)
Medication Instructions:  Your physician has recommended you make the following change in your medication:   ** Furosemide 20mg  - Take 1 tablet by mouth on 2 separate days this coming week as discussed with Dr Caryl Comes.  Medication sent to pharmacy on file.  *If you need a refill on your cardiac medications before your next appointment, please call your pharmacy*   Lab Work: None ordered.  If you have labs (blood work) drawn today and your tests are completely normal, you will receive your results only by: [place] MyChart Message (if you have MyChart) OR . A paper copy in the mail If you have any lab test that is abnormal or we need to change your treatment, we will call you to review the results.   Testing/Procedures: None ordered.    Follow-Up: At [facility], you and your health needs are our priority.  As part of our continuing mission to provide you with exceptional heart care, we have created designated Provider Care Teams.  These Care Teams include your primary Cardiologist (physician) and Advanced Practice Providers (APPs -  Physician Assistants and Nurse Practitioners) who all work together to provide you with the care you need, when you need it.  We recommend signing up for the patient portal called "MyChart".  Sign up information is provided on this After Visit Summary.  MyChart is used to connect with patients for Virtual Visits (Telemedicine).  Patients are able to view lab/test results, encounter notes, upcoming appointments, etc.  Non-urgent messages can be sent to your provider as well.   To learn more about what you can do with MyChart, go to [email or web address].    Your next appointment:  05/02/2020 at 145pm with Dr Caryl Comes

## 2020-04-20 NOTE — Telephone Encounter (Signed)
  Pt c/o medication issue:  1. Name of Medication: furosemide (LASIX) 20 MG tablet  2. How are you currently taking this medication (dosage and times per day)? Has not started  3. Are you having a reaction (difficulty breathing--STAT)? no  4. What is your medication issue? Patient states her new prescription was sent to the wrong pharmacy. She requests it be sent to [facility], [place], [place]

## 2020-04-20 NOTE — Progress Notes (Signed)
Electrophysiology TeleHealth Note   Due to national recommendations of social distancing due to COVID 19, an audio/video telehealth visit is felt to be most appropriate for this patient at this time.  See MyChart message from today for the patient's consent to telehealth for [facility].   Date:  04/20/2020   ID:  Brittany [name], DOB September 07, 1933, MRN [id number]  Location: patient's home  Provider location: [address] [place]  Evaluation Performed: Follow-up visit  PCP:  Donald Prose, MD  Cardiologist:     Electrophysiologist:  SK   Chief Complaint:  Atrial fib  History of Present Illness:    Brittany [name] is a 86 y.o. female who presents via audio/video conferencing for a telehealth visit today.  Since last being seen in our clinic for atrial fib, previoulsy on sotalol and now on dofetilide, sinus bradycardia,s/p pacemaker- MDT, the patient reports that dyspnea w exertion and diaphoresis is no different.     Recently started on metoprolol and xartia by PCP for BP     DATE TEST EF   8/12  Myoview  Normal LV No ischemia  6/19 Echo   55-60 %                Thromboembolic risk factors include age-63 hypertension-1 for a CHADS- score of 2 and a CHADS-VASc score of 4     Date Cr K Mg Hgb   5/17   1.9   1/18  0.59 4.5   14.7  11/18 0.55 4.4  15.8  3/20 0.61 4.4 2.1 12.9  9/20 0.63 4.9 2.0   8/21    13.0      The patient denies symptoms of fevers, chills, cough, or new SOB worrisome for COVID 19   Past Medical History:  Diagnosis Date  . Ankle fracture 2016  . Atrial fibrillation ([place])   . Depression   . Elevated parathyroid hormone   . Fracture of orbital floor with routine healing   . GERD (gastroesophageal reflux disease)   . History of colon polyps    benign  . Hypercalcemia   . Hypertension   . Hyponatremia   . Hypothyroid   . Macular degeneration    wet in the right and dry in the left  . OSA on  CPAP   . Osteoporosis   . Peripheral vascular disease ([place])   . PSVT (paroxysmal supraventricular tachycardia) ([place])   . Rotator cuff tear   . Sinus node dysfunction ([facility])    a. s/p MDT pacemaker  . Stroke ([place])   . Urinary incontinence   . Wrist fracture 2018    Past Surgical History:  Procedure Laterality Date  . BACK SURGERY    . CATARACT EXTRACTION    . CATARACT EXTRACTION W/ INTRAOCULAR LENS  IMPLANT, BILATERAL Bilateral   . COLONOSCOPY    . ESOPHAGOGASTRODUODENOSCOPY    . IR ANGIO INTRA EXTRACRAN SEL COM CAROTID INNOMINATE BILAT MOD SED  12/30/2017  . IR ANGIO VERTEBRAL SEL VERTEBRAL BILAT MOD SED  12/30/2017  . LAPAROSCOPIC CHOLECYSTECTOMY  08/11/1999  . LUMBAR DISC SURGERY  02/2014  . ORIF ANKLE FRACTURE Left 04/13/2015   Procedure: OPEN REDUCTION INTERNAL FIXATION (ORIF) LEFT ANKLE FRACTURE;  Surgeon: Meredith Pel, MD;  Location: [place];  Service: Orthopedics;  Laterality: Left;  . PACEMAKER PLACEMENT Right 2012   a. MDT dual chamber PPM implanted by Dr Caryl Comes   . SHOULDER ARTHROSCOPY W/ ROTATOR CUFF REPAIR Right  08/30/2014   WITH MINI-OPEN ROTATOR CUFF REPAIR AND SUBACROMIAL DECOMPRESSION  . SHOULDER ARTHROSCOPY WITH ROTATOR CUFF REPAIR AND SUBACROMIAL DECOMPRESSION Right 08/30/2014   Procedure: SHOULDER ARTHROSCOPY WITH MINI-OPEN ROTATOR CUFF REPAIR AND SUBACROMIAL DECOMPRESSION;  Surgeon: Meredith Pel, MD;  Location: [place];  Service: Orthopedics;  Laterality: Right;  RIGHT SHOULDER DIAGNOSTIC OPERATIVE ARTHROSCOPY, SUBACROMIAL DECOMPRESSION, MINI-OPEN ROTATOR CUFF REPAIR.  Marland Kitchen VAGINAL HYSTERECTOMY  1970    Current Outpatient Medications  Medication Sig Dispense Refill  . acetaminophen (TYLENOL) 500 MG tablet Take 500 mg by mouth 2 (two) times daily.     Marland Kitchen alendronate (FOSAMAX) 70 MG tablet Take 1 tablet (70 mg total) by mouth every 7 (seven) days. Take with a full glass of water on an empty stomach. 4 tablet 11  . apixaban (ELIQUIS) 5 MG TABS tablet Take 1 tablet (5  mg total) by mouth 2 (two) times daily. 180 tablet 1  . atorvastatin (LIPITOR) 40 MG tablet Take 1 tablet (40 mg total) by mouth daily at 6 PM. 30 tablet 3  . B Complex-C (B-COMPLEX WITH VITAMIN C) tablet Take 1 tablet by mouth daily with lunch.     . Cholecalciferol (VITAMIN D3) 5000 UNITS CAPS Take 5,000 Units by mouth daily with breakfast.     . desmopressin (DDAVP) 0.2 MG tablet Take 0.2 mg by mouth daily as needed (to control bladder on car trip).     Marland Kitchen diltiazem (CARDIZEM CD) 180 MG 24 hr capsule Take 1 capsule (180 mg total) by mouth 2 (two) times daily. 180 capsule 3  . dofetilide (TIKOSYN) 500 MCG capsule TAKE ONE CAPSULE BY MOUTH TWICE DAILY 60 capsule 11  . lisinopril (PRINIVIL,ZESTRIL) 20 MG tablet Take 1 tablet (20 mg total) by mouth daily. 90 tablet 1  . Lutein 20 MG TABS Take 20 mg by mouth daily with breakfast.     . magnesium oxide (MAG-OX) 400 MG tablet Take 1 tablet (400 mg total) by mouth daily. 30 tablet 0  . metoprolol succinate (TOPROL-XL) 50 MG 24 hr tablet TAKE TWO TABLETS BY MOUTH TWICE DAILY  with or immediately following meals 360 tablet 0  . Multiple Vitamin (MULTIVITAMIN WITH MINERALS) TABS tablet Take 1 tablet by mouth daily with breakfast.     . Multiple Vitamins-Minerals (PRESERVISION AREDS 2 PO) Take 1 capsule by mouth 2 (two) times daily with a meal.     . PRESCRIPTION MEDICATION Inhale into the lungs at bedtime. CPAP    . sertraline (ZOLOFT) 50 MG tablet Take 1 tablet (50 mg total) by mouth daily. 30 tablet 2   No current facility-administered medications for this visit.    Allergies:   Adhesive [tape], Benzalkonium chloride, and Neosporin [neomycin-polymyxin-gramicidin]   Social History:  The patient  reports that she has quit smoking. Her smoking use included cigarettes. She has a 45.00 pack-year smoking history. She has never used smokeless tobacco. She reports current alcohol use of about 28.0 standard drinks of alcohol per week. She reports that she does  not use drugs.   Family History:  The patient's   family history includes Asthma in her brother; Breast cancer in her paternal aunt; Cancer in her father; Colon cancer in her mother; Heart disease in her brother; Skin cancer in her brother; Stroke in her sister.   ROS:  Please see the history of present illness.   All other systems are personally reviewed and negative.    Exam:    Vital Signs:  BP (!) 152/85  Pulse 70   Ht 5\' 8"  (1.727 m)   Wt 204 lb (92.5 kg)   LMP 07/22/1968   BMI 31.02 kg/m     Labs/Other Tests and Data Reviewed:    Recent Labs: 11/03/2019: Magnesium 2.2   Wt Readings from Last 3 Encounters:  04/20/20 204 lb (92.5 kg)  04/13/20 206 lb (93.4 kg)  11/03/19 204 lb 6.4 oz (92.7 kg)     Other studies personally reviewed:    Last device remote is reviewed from [facility] PDF dated 9/21 which reveals normal device function,   arrhythmias - increased burden of atrial fib   ASSESSMENT & PLAN:   Atrial fib  Increasing burden   Sinus brady  DOE  High Risk Medication Surveillance dofetilide ] Hypertension    Increasing dyspnea with diaphoresis but without edema or abd distension supports concern that symptoms may be 2/2 afib  However her pulse ox measurements have had recent numbers also in the 60s which I would not have expected from her atrial arrhythmia, and would expect those numbers to be 75+    The higher doses of the BB and CCB by her PCP may mask the presence of the atrial rhythm as estimated by ventricular rate   She will try and be more attentive over the next week and will plan reinterrogation next week and further discussion If she is failing dofetilide could consider adjunctive ranolazine or switching to amiodarone  BP is better     COVID 19 screen The patient denies symptoms of COVID 19 at this time.  The importance of social distancing was discussed today.  Follow-up:  As Scheduled     Current medicines are reviewed at length with the  patient today.   The patient does not have concerns regarding her medicines.  The following changes were made today:  Furosemide 20 mg po # 30 take as directed ( take one pill on two separate days this coming week)   Labs/ tests ordered today include: when in office  No orders of the defined types were placed in this encounter.   Future tests ( post COVID )     Patient Risk:  after full review of this patients clinical status, I feel that they are at moderate  risk at this time.  Today, I have spent 12 minutes with the patient with telehealth technology discussing the above.  Signed, Virl Axe, MD  04/20/2020 2:35 PM     [place]  [id number] [phone] (office) [phone] (fax)

## 2020-04-21 NOTE — Addendum Note (Signed)
Addended by: Cheri Kearns A on: 04/21/2020 01:08 PM   Modules accepted: Level of Service

## 2020-04-21 NOTE — Progress Notes (Signed)
Remote pacemaker transmission.   

## 2020-05-01 NOTE — Progress Notes (Signed)
Patient Care Team: Donald Prose, MD as PCP - General (Family Medicine) Deboraha Sprang, MD as PCP - Electrophysiology (Cardiology) Bjorn Loser, MD as Consulting Physician (Urology) Deboraha Sprang, MD as Consulting Physician (Cardiology) Chesley Mires, MD as Consulting Physician (Pulmonary Disease) Melrose Nakayama, MD as Consulting Physician (Orthopedic Surgery) Antionette Fairy Isaias Cowman, MD as Referring Physician (Ophthalmology) Paulla Dolly Tamala Fothergill, DPM as Consulting Physician (Podiatry)   HPI  Brittany [name] Plan is a 86 y.o. female Seen in followup for  pacer implanted 12/12 for sinus node dysfunction.  She has atrial fibrillation for which she takes sotalol currently of 120 mg twice daily.   Cardiac evaluation has included a Myoview 8/12 which demonstrated no ischemia and normal left ventricular function   DATE TEST EF   8/12  Myoview  Normal LV No ischemia  6/19 Echo  55-60 %                Admitted 8/20 for dofetilide initiation; and has been holding sinus but with more dyspnea and HR by pulse Ox in the 60s, not suggestive of Afib as she may be well rate controlled   Has been holding sinus rhythm   Last night had palpitations.  But mostly has not been aware of what atrial fibrillation her device is detected  Dyspnea on modest exertion.  Not respond to Lasix 20.  However, her urine did not change appreciably either.  Mild edema.  No chest pain.  No bleeding       Date Cr K Mg Hgb   5/17   1.9   1/18  0.59 4.5   14.7  11/18 0.55 4.4  15.8  3/20 0.61 4.4 2.1 12.9  9/20 0.63 4.9 2.0   8/21    69.4  Thromboembolic risk factors include age-77 hypertension-1 for a CHADS-  score of 2 and a CHADS-VASc score of 4      Past Medical History:  Diagnosis Date   Ankle fracture 2016   Atrial fibrillation ([facility])    Depression    Elevated parathyroid hormone    Fracture of orbital floor with routine healing    GERD (gastroesophageal reflux  disease)    History of colon polyps    benign   Hypercalcemia    Hypertension    Hyponatremia    Hypothyroid    Macular degeneration    wet in the right and dry in the left   OSA on CPAP    Osteoporosis    Peripheral vascular disease ([facility])    PSVT (paroxysmal supraventricular tachycardia) ([facility])    Rotator cuff tear    Sinus node dysfunction ([facility])    a. s/p MDT pacemaker   Stroke [facility])    Urinary incontinence    Wrist fracture 2018    Past Surgical History:  Procedure Laterality Date   BACK SURGERY     CATARACT EXTRACTION     CATARACT EXTRACTION W/ INTRAOCULAR LENS  IMPLANT, BILATERAL Bilateral    COLONOSCOPY     ESOPHAGOGASTRODUODENOSCOPY     IR ANGIO INTRA EXTRACRAN SEL COM CAROTID INNOMINATE BILAT MOD SED  12/30/2017   IR ANGIO VERTEBRAL SEL VERTEBRAL BILAT MOD SED  12/30/2017   LAPAROSCOPIC CHOLECYSTECTOMY  08/11/1999   LUMBAR [place] SURGERY  02/2014   ORIF ANKLE FRACTURE Left 04/13/2015   Procedure: OPEN REDUCTION INTERNAL FIXATION (ORIF) LEFT ANKLE FRACTURE;  Surgeon: Meredith Pel, MD;  Location: [place];  Service: Orthopedics;  Laterality: Left;   PACEMAKER  PLACEMENT Right 2012   a. MDT dual chamber PPM implanted by Dr Caryl Comes    SHOULDER ARTHROSCOPY W/ ROTATOR CUFF REPAIR Right 08/30/2014   WITH MINI-OPEN ROTATOR CUFF REPAIR AND SUBACROMIAL DECOMPRESSION   SHOULDER ARTHROSCOPY WITH ROTATOR CUFF REPAIR AND SUBACROMIAL DECOMPRESSION Right 08/30/2014   Procedure: SHOULDER ARTHROSCOPY WITH MINI-OPEN ROTATOR CUFF REPAIR AND SUBACROMIAL DECOMPRESSION;  Surgeon: Meredith Pel, MD;  Location: [facility];  Service: Orthopedics;  Laterality: Right;  RIGHT SHOULDER DIAGNOSTIC OPERATIVE ARTHROSCOPY, SUBACROMIAL DECOMPRESSION, MINI-OPEN ROTATOR CUFF REPAIR.   VAGINAL HYSTERECTOMY  1970    Current Outpatient Medications  Medication Sig Dispense Refill   acetaminophen (TYLENOL) 500 MG tablet Take 500 mg by mouth 2 (two) times daily.      alendronate  (FOSAMAX) 70 MG tablet Take 1 tablet (70 mg total) by mouth every 7 (seven) days. Take with a full glass of water on an empty stomach. 4 tablet 11   apixaban (ELIQUIS) 5 MG TABS tablet Take 1 tablet (5 mg total) by mouth 2 (two) times daily. 180 tablet 1   atorvastatin (LIPITOR) 40 MG tablet Take 1 tablet (40 mg total) by mouth daily at 6 PM. 30 tablet 3   B Complex-C (B-COMPLEX WITH VITAMIN C) tablet Take 1 tablet by mouth daily with lunch.      Cholecalciferol (VITAMIN D3) 5000 UNITS CAPS Take 5,000 Units by mouth daily with breakfast.      desmopressin (DDAVP) 0.2 MG tablet Take 0.2 mg by mouth daily as needed (to control bladder on car trip).      diltiazem (CARDIZEM) 120 MG tablet Take 120 mg by mouth in the morning and at bedtime.     dofetilide (TIKOSYN) 500 MCG capsule TAKE ONE CAPSULE BY MOUTH TWICE DAILY 60 capsule 11   lisinopril (PRINIVIL,ZESTRIL) 20 MG tablet Take 1 tablet (20 mg total) by mouth daily. 90 tablet 1   Lutein 20 MG TABS Take 20 mg by mouth daily with breakfast.      magnesium oxide (MAG-OX) 400 MG tablet Take 1 tablet (400 mg total) by mouth daily. 30 tablet 0   metoprolol succinate (TOPROL-XL) 50 MG 24 hr tablet TAKE TWO TABLETS BY MOUTH TWICE DAILY  with or immediately following meals 360 tablet 0   Multiple Vitamin (MULTIVITAMIN WITH MINERALS) TABS tablet Take 1 tablet by mouth daily with breakfast.      Multiple Vitamins-Minerals (PRESERVISION AREDS 2 PO) Take 1 capsule by mouth 2 (two) times daily with a meal.      PRESCRIPTION MEDICATION Inhale into the lungs at bedtime. CPAP     sertraline (ZOLOFT) 50 MG tablet Take 1 tablet (50 mg total) by mouth daily. 30 tablet 2   No current facility-administered medications for this visit.    Allergies  Allergen Reactions   Adhesive [Tape] Swelling and Rash   Benzalkonium Chloride Rash    Pt was not aware of this allergy   Neosporin [Neomycin-Polymyxin-Gramicidin] Swelling and Rash      Review of  Systems negative except from HPI and PMH  Physical Exam BP (!) 132/58    Pulse 70    Ht 5\' 8"  (1.727 m)    Wt 208 lb 3.2 oz (94.4 kg)    LMP 07/22/1968    SpO2 98%    BMI 31.66 kg/m  Well developed and well nourished in no acute distress HENT normal Neck supple with JVP-8 + HJR Clear Device pocket well healed; without hematoma or erythema.  There is no tethering  Regular  rate and rhythm, no   murmur Abd-soft with active BS No Clubbing cyanosis tr edema Skin-warm and dry A & Oriented  Grossly normal sensory and motor function  ECG atrial paced at 64 Intervals 27/10/45 Otherwise normal   Assessment and  Plan Atrial fibrillation-persistent     Sinus bradycardia   HFpEF  Pacemaker-[employer]  The patient's device was interrogated.  The information was reviewed. No changes were made in the programming.       High Risk Medication Surveillance -Dofetilide  Hypertension  Hyperlipidemia  Increased burden of atrial fibrillation, about 20% over the last year.  It has been variably present.  No clear correlation between it and symptoms.  For now we will continue dofetilide.  Needs surveillance laboratories.  Mildly volume overloaded.  Dyspnea I suspect at least in part related to HFpEF.  We will try her again on a diuretic regime, 40 mg every morning x3 days.  With recurrent tachypalpitations she is encouraged to take an extra diltiazem or metoprolol succinate with these events.  Blood pressure well controlled  On Anticoagulation;  No bleeding issues

## 2020-05-02 ENCOUNTER — Other Ambulatory Visit: Payer: Self-pay

## 2020-05-02 ENCOUNTER — Ambulatory Visit (INDEPENDENT_AMBULATORY_CARE_PROVIDER_SITE_OTHER): Payer: Medicare Other | Admitting: Internal Medicine

## 2020-05-02 ENCOUNTER — Encounter: Payer: Self-pay | Admitting: Internal Medicine

## 2020-05-02 VITALS — BP 132/58 | HR 70 | Ht 68.0 in | Wt 208.2 lb

## 2020-05-02 DIAGNOSIS — Z950 Presence of cardiac pacemaker: Secondary | ICD-10-CM | POA: Diagnosis not present

## 2020-05-02 DIAGNOSIS — R001 Bradycardia, unspecified: Secondary | ICD-10-CM | POA: Diagnosis not present

## 2020-05-02 DIAGNOSIS — Z79899 Other long term (current) drug therapy: Secondary | ICD-10-CM

## 2020-05-02 DIAGNOSIS — M9950 Intervertebral disc stenosis of neural canal of head region: Secondary | ICD-10-CM | POA: Diagnosis not present

## 2020-05-02 DIAGNOSIS — M9903 Segmental and somatic dysfunction of lumbar region: Secondary | ICD-10-CM | POA: Diagnosis not present

## 2020-05-02 DIAGNOSIS — I4891 Unspecified atrial fibrillation: Secondary | ICD-10-CM | POA: Diagnosis not present

## 2020-05-02 DIAGNOSIS — M546 Pain in thoracic spine: Secondary | ICD-10-CM | POA: Diagnosis not present

## 2020-05-02 DIAGNOSIS — M9902 Segmental and somatic dysfunction of thoracic region: Secondary | ICD-10-CM | POA: Diagnosis not present

## 2020-05-02 MED ORDER — APIXABAN 5 MG PO TABS: 5.0000 mg | ORAL_TABLET | Freq: Two times a day (BID) | ORAL | 3 refills | Status: DC

## 2020-05-02 NOTE — Patient Instructions (Addendum)
Medication Instructions:   ** Increase your Furosemide to 40mg  - 2 (20mg ) - tablet by mouth every morning x 3 days  ** Handwritten Rx provided for Eliquis at patient request   *If you need a refill on your cardiac medications before your next appointment, please call your pharmacy*   Lab Work: CBC, Mg and BMET today If you have labs (blood work) drawn today and your tests are completely normal, you will receive your results only by: Marland Kitchen MyChart Message (if you have MyChart) OR . A paper copy in the mail If you have any lab test that is abnormal or we need to change your treatment, we will call you to review the results.   Testing/Procedures: None ordered.    Follow-Up: At [facility], you and your health needs are our priority.  As part of our continuing mission to provide you with exceptional heart care, we have created designated Provider Care Teams.  These Care Teams include your primary Cardiologist (physician) and Advanced Practice Providers (APPs -  Physician Assistants and Nurse Practitioners) who all work together to provide you with the care you need, when you need it.  We recommend signing up for the patient portal called "MyChart".  Sign up information is provided on this After Visit Summary.  MyChart is used to connect with patients for Virtual Visits (Telemedicine).  Patients are able to view lab/test results, encounter notes, upcoming appointments, etc.  Non-urgent messages can be sent to your provider as well.   To learn more about what you can do with MyChart, go to [email or web address].    Your next appointment:  6 months with Dr Caryl Comes

## 2020-05-03 DIAGNOSIS — F411 Generalized anxiety disorder: Secondary | ICD-10-CM | POA: Diagnosis not present

## 2020-05-03 DIAGNOSIS — E785 Hyperlipidemia, unspecified: Secondary | ICD-10-CM | POA: Diagnosis not present

## 2020-05-03 DIAGNOSIS — H9190 Unspecified hearing loss, unspecified ear: Secondary | ICD-10-CM | POA: Diagnosis not present

## 2020-05-03 DIAGNOSIS — I10 Essential (primary) hypertension: Secondary | ICD-10-CM | POA: Diagnosis not present

## 2020-05-03 DIAGNOSIS — Z23 Encounter for immunization: Secondary | ICD-10-CM | POA: Diagnosis not present

## 2020-05-03 LAB — BASIC METABOLIC PANEL
BUN/Creatinine Ratio: 13 (ref 12–28)
BUN: 9 mg/dL (ref 8–27)
CO2: 24 mmol/L (ref 20–29)
Calcium: 10.3 mg/dL (ref 8.7–10.3)
Chloride: 102 mmol/L (ref 96–106)
Creatinine, Ser: 0.69 mg/dL (ref 0.57–1.00)
GFR calc Af Amer: 92 mL/min/{1.73_m2} (ref >59)
GFR calc non Af Amer: 80 mL/min/{1.73_m2} (ref >59)
Glucose: 103 mg/dL — ABNORMAL HIGH (ref 65–99)
Potassium: 4.7 mmol/L (ref 3.5–5.2)
Sodium: 140 mmol/L (ref 134–144)

## 2020-05-03 LAB — CBC
Hematocrit: 39 % (ref 34.0–46.6)
Hemoglobin: 13.9 g/dL (ref 11.1–15.9)
MCH: 33.3 pg — ABNORMAL HIGH (ref 26.6–33.0)
MCHC: 35.6 g/dL (ref 31.5–35.7)
MCV: 93 fL (ref 79–97)
Platelets: 379 10*3/uL (ref 150–450)
RBC: 4.18 x10E6/uL (ref 3.77–5.28)
RDW: 13 % (ref 11.7–15.4)
WBC: 7.8 10*3/uL (ref 3.4–10.8)

## 2020-05-03 LAB — MAGNESIUM: Magnesium: 1.9 mg/dL (ref 1.6–2.3)

## 2020-05-04 DIAGNOSIS — H903 Sensorineural hearing loss, bilateral: Secondary | ICD-10-CM | POA: Diagnosis not present

## 2020-05-09 ENCOUNTER — Telehealth: Payer: Self-pay | Admitting: Internal Medicine

## 2020-05-09 NOTE — Telephone Encounter (Signed)
Patient calling for lab results. She had them done on 05/02/20 and still has not heard anything. Please advise.

## 2020-05-09 NOTE — Telephone Encounter (Signed)
Spoke with pt and advised Dr Caryl Comes is currently out of office until next week.  Pt advised Dr Caryl Comes has not yet reviewed her labs but once he is back in the office he will do this and we will contact pt with any additional recommendations at that time.  Pt verbalizes understanding and agrees with current plan.

## 2020-05-12 ENCOUNTER — Ambulatory Visit
Admission: RE | Admit: 2020-05-12 | Discharge: 2020-05-12 | Disposition: A | Discharge status: Home / Self Care | Payer: Medicare Other | Source: Ambulatory Visit | Attending: Family Medicine | Admitting: Family Medicine

## 2020-05-12 ENCOUNTER — Other Ambulatory Visit: Payer: Self-pay

## 2020-05-12 DIAGNOSIS — Z1231 Encounter for screening mammogram for malignant neoplasm of breast: Secondary | ICD-10-CM | POA: Diagnosis not present

## 2020-05-16 ENCOUNTER — Telehealth: Payer: Self-pay | Admitting: Internal Medicine

## 2020-05-16 NOTE — Telephone Encounter (Signed)
    Pt c/o BP issue: STAT if pt c/o blurred vision, one-sided weakness or slurred speech  1. What are your last 5 BP readings?  05/15/20: 150/85 HR 74 05/16/20: 156/91 HR 101  2. Are you having any other symptoms (ex. Dizziness, headache, blurred vision, passed out)?  No  3. What is your BP issue? Pt states her BP is higher than normal for her. She said her BP is usually around 130/70. Pt says she doesn't write it down regularly. She wanted to know if she needed to adjust her medications or if there is something else she should do.  Pt c/o medication issue:  1. Name of Medication: diltiazem (CARDIZEM) 120 MG tablet  2. How are you currently taking this medication (dosage and times per day)? 180 mg 2x daily   3. Are you having a reaction (difficulty breathing--STAT)?   4. What is your medication issue? Pt gave the Nurse the wrong dosage of her medicine at her last appt.    Patient has another appointment and will not be home until around 3 pm today.

## 2020-05-16 NOTE — Telephone Encounter (Signed)
Spoke with pt who reports BP elevated today and yesterday.  Pt states she did take BP about 2 hours after taking medication.  Pt states she does not regularly take her BP and denies any additional symptoms at this time.    Pt advised to monitor her BP for the next 3 days about an hour after she has taken her medication with both feet on the floor and arm at heart level.and after she has rested for a few moments.  Reviewed ED precautions with pt.  Pt will contact office with additional BP and HR readings.  Pt verbalizes understanding and agrees with current plan.

## 2020-05-31 ENCOUNTER — Ambulatory Visit (INDEPENDENT_AMBULATORY_CARE_PROVIDER_SITE_OTHER): Payer: Medicare Other

## 2020-05-31 DIAGNOSIS — I495 Sick sinus syndrome: Secondary | ICD-10-CM | POA: Diagnosis not present

## 2020-06-01 LAB — CUP PACEART REMOTE DEVICE CHECK
Battery Impedance: 2929 Ohm
Battery Remaining Longevity: 20 mo
Battery Voltage: 2.73 V
Brady Statistic AP VP Percent: 5 %
Brady Statistic AP VS Percent: 74 %
Brady Statistic AS VP Percent: 6 %
Brady Statistic AS VS Percent: 15 %
Date Time Interrogation Session: 20211110095451
Implantable Lead Implant Date: 20121213
Implantable Lead Implant Date: 20121213
Implantable Lead Location: 753859
Implantable Lead Location: 753860
Implantable Lead Model: 4076
Implantable Lead Model: 4076
Implantable Lead Serial Number: 835025
Implantable Lead Serial Number: 869269
Implantable Pulse Generator Implant Date: 20121213
Lead Channel Impedance Value: 393 Ohm
Lead Channel Impedance Value: 523 Ohm
Lead Channel Pacing Threshold Amplitude: 0.625 V
Lead Channel Pacing Threshold Amplitude: 0.75 V
Lead Channel Pacing Threshold Pulse Width: 0.4 ms
Lead Channel Pacing Threshold Pulse Width: 0.4 ms
Lead Channel Setting Pacing Amplitude: 2 V
Lead Channel Setting Pacing Amplitude: 2.5 V
Lead Channel Setting Pacing Pulse Width: 0.4 ms
Lead Channel Setting Sensing Sensitivity: 4 mV

## 2020-06-02 DIAGNOSIS — M1611 Unilateral primary osteoarthritis, right hip: Secondary | ICD-10-CM | POA: Diagnosis not present

## 2020-06-02 DIAGNOSIS — M1612 Unilateral primary osteoarthritis, left hip: Secondary | ICD-10-CM | POA: Diagnosis not present

## 2020-06-02 NOTE — Progress Notes (Signed)
Remote pacemaker transmission.   

## 2020-06-12 DIAGNOSIS — E785 Hyperlipidemia, unspecified: Secondary | ICD-10-CM | POA: Diagnosis not present

## 2020-06-12 DIAGNOSIS — M810 Age-related osteoporosis without current pathological fracture: Secondary | ICD-10-CM | POA: Diagnosis not present

## 2020-06-12 DIAGNOSIS — I4891 Unspecified atrial fibrillation: Secondary | ICD-10-CM | POA: Diagnosis not present

## 2020-06-12 DIAGNOSIS — I10 Essential (primary) hypertension: Secondary | ICD-10-CM | POA: Diagnosis not present

## 2020-06-14 DIAGNOSIS — M25562 Pain in left knee: Secondary | ICD-10-CM | POA: Diagnosis not present

## 2020-06-14 DIAGNOSIS — M25561 Pain in right knee: Secondary | ICD-10-CM | POA: Diagnosis not present

## 2020-06-14 DIAGNOSIS — M1711 Unilateral primary osteoarthritis, right knee: Secondary | ICD-10-CM | POA: Diagnosis not present

## 2020-06-14 DIAGNOSIS — M1712 Unilateral primary osteoarthritis, left knee: Secondary | ICD-10-CM | POA: Diagnosis not present

## 2020-06-19 ENCOUNTER — Ambulatory Visit: Payer: Medicare Other | Admitting: Pulmonary Disease

## 2020-06-21 ENCOUNTER — Other Ambulatory Visit: Payer: Self-pay

## 2020-06-21 ENCOUNTER — Ambulatory Visit (INDEPENDENT_AMBULATORY_CARE_PROVIDER_SITE_OTHER): Payer: Medicare Other | Admitting: Adult Health

## 2020-06-21 ENCOUNTER — Encounter: Payer: Self-pay | Admitting: Adult Health

## 2020-06-21 DIAGNOSIS — E669 Obesity, unspecified: Secondary | ICD-10-CM | POA: Diagnosis not present

## 2020-06-21 DIAGNOSIS — G4733 Obstructive sleep apnea (adult) (pediatric): Secondary | ICD-10-CM | POA: Diagnosis not present

## 2020-06-21 DIAGNOSIS — Z9989 Dependence on other enabling machines and devices: Secondary | ICD-10-CM

## 2020-06-21 DIAGNOSIS — E66811 Obesity, class 1: Secondary | ICD-10-CM | POA: Insufficient documentation

## 2020-06-21 NOTE — Progress Notes (Signed)
@Patient  ID: Brittany [name], female    DOB: 01-31-1934, 86 y.o.   MRN: [id number]  Chief Complaint  Patient presents with  . Follow-up    OSA     Referring provider: Donald Prose, MD  HPI: 86 year old female followed for obstructive sleep apnea Medical history significant for A. fib and hypertension, sinus node dysfunction status post pacemaker implantation and macular degeneration  TEST/EVENTS :  PSG 08/13/12 ([place]) >> AHI 24, SaO2 low 72%, CPAP 8 PSG 07/03/14 >> AHI 24.4, SaO2 low 76%, PLMI 81.8. CPAP 8 cm H2O >>AHI 0, +S.  CPAP10/04/20 to 05/24/19 >> used on 30 of 30 nights with average 9 hrs 28 min.  Average AHI 3 with CPAP 8 cm H2O.  06/21/2020 Follow up : OSA  Patient presents for yearly follow-up for sleep apnea.  Patient has moderate obstructive sleep apnea is on nocturnal CPAP.  Says she is doing very well on CPAP.  She feels rested with no significant daytime sleepiness..  Feels that she benefits from CPAP.  Patient says she wears her CPAP every night never misses any nights.  Says she cannot sleep without it. CPAP download shows excellent compliance with 100% usage.  Daily average usage at 9 hours.  Patient is on CPAP 8 cm H2O.  AHI 2.8.  Minimal leaks.   Allergies  Allergen Reactions  . Adhesive [Tape] Swelling and Rash  . Benzalkonium Chloride Rash    Pt was not aware of this allergy  . Neosporin [Neomycin-Polymyxin-Gramicidin] Swelling and Rash    Immunization History  Administered Date(s) Administered  . Fluad Quad(high Dose 65+) 04/25/2020  . Influenza Split 07/01/2013, 04/21/2014, 04/27/2017  . Influenza, High Dose Seasonal PF 04/12/2016, 04/22/2019  . Influenza, Seasonal, Injecte, Preservative Fre 04/22/2013  . Influenza,inj,Quad PF,6-35 Mos 05/05/2017  . Influenza-Unspecified 04/21/2014, 05/06/2015, 05/05/2017, 04/21/2018  . Moderna SARS-COV2 Booster Vaccination 05/24/2020  . Moderna SARS-COVID-2 Vaccination 08/03/2019, 08/31/2019  . PPD Test  11/30/2012, 01/01/2013  . Pneumococcal Conjugate-13 05/18/2014  . Pneumococcal Polysaccharide-23 07/23/2011, 10/28/2012  . Tdap 07/22/2012, 10/28/2012    Past Medical History:  Diagnosis Date  . Ankle fracture 2016  . Atrial fibrillation ([place])   . Depression   . Elevated parathyroid hormone   . Fracture of orbital floor with routine healing   . GERD (gastroesophageal reflux disease)   . History of colon polyps    benign  . Hypercalcemia   . Hypertension   . Hyponatremia   . Hypothyroid   . Macular degeneration    wet in the right and dry in the left  . OSA on CPAP   . Osteoporosis   . Peripheral vascular disease ([place])   . PSVT (paroxysmal supraventricular tachycardia) ([place])   . Rotator cuff tear   . Sinus node dysfunction ([facility])    a. s/p MDT pacemaker  . Stroke ([place])   . Urinary incontinence   . Wrist fracture 2018    Tobacco History: Social History   Tobacco Use  Smoking Status Former Smoker  . Packs/day: 1.00  . Years: 45.00  . Pack years: 45.00  . Types: Cigarettes  . Quit date: 56  . Years since quitting: 29.9  Smokeless Tobacco Never Used  Tobacco Comment   quit smoking in 1992   Counseling given: Not Answered Comment: quit smoking in 1992   Outpatient Medications Prior to Visit  Medication Sig Dispense Refill  . acetaminophen (TYLENOL) 500 MG tablet Take 500 mg by mouth 2 (two) times daily.     Marland Kitchen  alendronate (FOSAMAX) 70 MG tablet Take 1 tablet (70 mg total) by mouth every 7 (seven) days. Take with a full glass of water on an empty stomach. 4 tablet 11  . apixaban (ELIQUIS) 5 MG TABS tablet Take 1 tablet (5 mg total) by mouth 2 (two) times daily. 180 tablet 3  . atorvastatin (LIPITOR) 40 MG tablet Take 1 tablet (40 mg total) by mouth daily at 6 PM. 30 tablet 3  . B Complex-C (B-COMPLEX WITH VITAMIN C) tablet Take 1 tablet by mouth daily with lunch.     . Cholecalciferol (VITAMIN D3) 5000 UNITS CAPS Take 5,000 Units by mouth daily with breakfast.       . desmopressin (DDAVP) 0.2 MG tablet Take 0.2 mg by mouth daily as needed (to control bladder on car trip).     Marland Kitchen diltiazem (CARDIZEM) 120 MG tablet Take 120 mg by mouth in the morning and at bedtime.    . dofetilide (TIKOSYN) 500 MCG capsule TAKE ONE CAPSULE BY MOUTH TWICE DAILY 60 capsule 11  . lisinopril (PRINIVIL,ZESTRIL) 20 MG tablet Take 1 tablet (20 mg total) by mouth daily. 90 tablet 1  . Lutein 20 MG TABS Take 20 mg by mouth daily with breakfast.     . magnesium oxide (MAG-OX) 400 MG tablet Take 1 tablet (400 mg total) by mouth daily. 30 tablet 0  . metoprolol succinate (TOPROL-XL) 50 MG 24 hr tablet TAKE TWO TABLETS BY MOUTH TWICE DAILY  with or immediately following meals 360 tablet 0  . Multiple Vitamin (MULTIVITAMIN WITH MINERALS) TABS tablet Take 1 tablet by mouth daily with breakfast.     . Multiple Vitamins-Minerals (PRESERVISION AREDS 2 PO) Take 1 capsule by mouth 2 (two) times daily with a meal.     . PRESCRIPTION MEDICATION Inhale into the lungs at bedtime. CPAP    . sertraline (ZOLOFT) 50 MG tablet Take 1 tablet (50 mg total) by mouth daily. 30 tablet 2   No facility-administered medications prior to visit.     Review of Systems:   Constitutional:   No  weight loss, night sweats,  Fevers, chills, fatigue, or  lassitude.  HEENT:   No headaches,  Difficulty swallowing,  Tooth/dental problems, or  Sore throat,                No sneezing, itching, ear ache, nasal congestion, post nasal drip,   CV:  No chest pain,  Orthopnea, PND, swelling in lower extremities, anasarca, dizziness, palpitations, syncope.   GI  No heartburn, indigestion, abdominal pain, nausea, vomiting, diarrhea, change in bowel habits, loss of appetite, bloody stools.   Resp: No shortness of breath with exertion or at rest.  No excess mucus, no productive cough,  No non-productive cough,  No coughing up of blood.  No change in color of mucus.  No wheezing.  No chest wall deformity  Skin: no rash or  lesions.  GU: no dysuria, change in color of urine, no urgency or frequency.  No flank pain, no hematuria   MS:  No joint pain or swelling.  No decreased range of motion.  No back pain.    Physical Exam  BP 124/70 (BP Location: Left Arm, Patient Position: Sitting, Cuff Size: Normal)   Pulse 67   Temp (!) 97.2 F (36.2 C) (Temporal)   Ht 5\' 8"  (1.727 m)   Wt 205 lb 9.6 oz (93.3 kg)   LMP 07/22/1968   SpO2 98%   BMI 31.26 kg/m   GEN:  A/Ox3; pleasant , NAD, well nourished    HEENT:  [place]/AT,    NOSE-clear, THROAT-clear, no lesions, no postnasal drip or exudate noted.  Class II-III MP airway  NECK:  Supple w/ fair ROM; no JVD; normal carotid impulses w/o bruits; no thyromegaly or nodules palpated; no lymphadenopathy.    RESP  Clear  P & A; w/o, wheezes/ rales/ or rhonchi. no accessory muscle use, no dullness to percussion  CARD:  RRR, no m/r/g, no peripheral edema, pulses intact, no cyanosis or clubbing.  GI:   Soft & nt; nml bowel sounds; no organomegaly or masses detected.   Musco: Warm bil, no deformities or joint swelling noted.   Neuro: alert, no focal deficits noted.    Skin: Warm, no lesions or rashes    Lab Results:    BNP No results found for: BNP  ProBNP No results found for: PROBNP  Imaging: CUP PACEART REMOTE DEVICE CHECK  Result Date: 06/01/2020 Scheduled remote reviewed. Normal device function.  46 AMS events appears a tach - longest duration 46 hours.  Known PAF, ASA only per pt choice.  A burden 21.3%.  Histograms appear controlled.  R. Powers, LPN, CVRS Next remote 91 days.     No flowsheet data found.  No results found for: NITRICOXIDE      Assessment & Plan:   OSA on CPAP Excellent control and compliance on nocturnal CPAP  Plan  Patient Instructions  Keep up good work. Continue on CPAP At bedtime   Work on healthy weight  Follow up with Dr. Halford Chessman  Or Dolly Harbach NP in 1 year and As needed       Obesity (BMI 30.0-34.9) Healthy  weight loss discussed     Rexene Edison, NP 06/21/2020

## 2020-06-21 NOTE — Patient Instructions (Signed)
Keep up good work. Continue on CPAP At bedtime   Work on healthy weight  Follow up with Dr. Halford Chessman  Or Onika Gudiel NP in 1 year and As needed

## 2020-06-21 NOTE — Progress Notes (Signed)
Reviewed and agree with assessment/plan.   Chesley Mires, MD [facility] Pulmonary/Critical Care 06/21/2020, 12:31 PM Pager:  [phone]

## 2020-06-21 NOTE — Assessment & Plan Note (Signed)
Healthy weight loss discussed 

## 2020-06-21 NOTE — Assessment & Plan Note (Signed)
Excellent control and compliance on nocturnal CPAP  Plan  Patient Instructions  Keep up good work. Continue on CPAP At bedtime   Work on healthy weight  Follow up with Dr. Halford Chessman  Or Webb Weed NP in 1 year and As needed

## 2020-07-10 DIAGNOSIS — M9903 Segmental and somatic dysfunction of lumbar region: Secondary | ICD-10-CM | POA: Diagnosis not present

## 2020-07-10 DIAGNOSIS — M546 Pain in thoracic spine: Secondary | ICD-10-CM | POA: Diagnosis not present

## 2020-07-10 DIAGNOSIS — M9902 Segmental and somatic dysfunction of thoracic region: Secondary | ICD-10-CM | POA: Diagnosis not present

## 2020-07-10 DIAGNOSIS — M9950 Intervertebral disc stenosis of neural canal of head region: Secondary | ICD-10-CM | POA: Diagnosis not present

## 2020-07-12 DIAGNOSIS — H40013 Open angle with borderline findings, low risk, bilateral: Secondary | ICD-10-CM | POA: Diagnosis not present

## 2020-07-12 DIAGNOSIS — H35363 Drusen (degenerative) of macula, bilateral: Secondary | ICD-10-CM | POA: Diagnosis not present

## 2020-07-12 DIAGNOSIS — G43109 Migraine with aura, not intractable, without status migrainosus: Secondary | ICD-10-CM | POA: Diagnosis not present

## 2020-07-12 DIAGNOSIS — H353133 Nonexudative age-related macular degeneration, bilateral, advanced atrophic without subfoveal involvement: Secondary | ICD-10-CM | POA: Diagnosis not present

## 2020-07-12 DIAGNOSIS — H04123 Dry eye syndrome of bilateral lacrimal glands: Secondary | ICD-10-CM | POA: Diagnosis not present

## 2020-07-12 DIAGNOSIS — Z961 Presence of intraocular lens: Secondary | ICD-10-CM | POA: Diagnosis not present

## 2020-07-20 DIAGNOSIS — E785 Hyperlipidemia, unspecified: Secondary | ICD-10-CM | POA: Diagnosis not present

## 2020-07-20 DIAGNOSIS — I10 Essential (primary) hypertension: Secondary | ICD-10-CM | POA: Diagnosis not present

## 2020-07-20 DIAGNOSIS — I4891 Unspecified atrial fibrillation: Secondary | ICD-10-CM | POA: Diagnosis not present

## 2020-07-20 DIAGNOSIS — M810 Age-related osteoporosis without current pathological fracture: Secondary | ICD-10-CM | POA: Diagnosis not present

## 2020-07-30 ENCOUNTER — Other Ambulatory Visit: Payer: Self-pay | Admitting: Internal Medicine

## 2020-08-16 DIAGNOSIS — E785 Hyperlipidemia, unspecified: Secondary | ICD-10-CM | POA: Diagnosis not present

## 2020-08-16 DIAGNOSIS — I10 Essential (primary) hypertension: Secondary | ICD-10-CM | POA: Diagnosis not present

## 2020-08-16 DIAGNOSIS — I4891 Unspecified atrial fibrillation: Secondary | ICD-10-CM | POA: Diagnosis not present

## 2020-08-16 DIAGNOSIS — M810 Age-related osteoporosis without current pathological fracture: Secondary | ICD-10-CM | POA: Diagnosis not present

## 2020-08-18 ENCOUNTER — Other Ambulatory Visit: Payer: Self-pay

## 2020-08-18 ENCOUNTER — Ambulatory Visit (INDEPENDENT_AMBULATORY_CARE_PROVIDER_SITE_OTHER): Payer: Medicare Other | Admitting: Otolaryngology

## 2020-08-18 VITALS — Temp 97.3°F

## 2020-08-18 DIAGNOSIS — H903 Sensorineural hearing loss, bilateral: Secondary | ICD-10-CM

## 2020-08-18 DIAGNOSIS — H6123 Impacted cerumen, bilateral: Secondary | ICD-10-CM

## 2020-08-18 NOTE — Progress Notes (Signed)
HPI: Brittany [name] is a 87 y.o. female who returns today for evaluation of wax buildup in her ears.  She wears bilateral hearing aids.  She does use Q-tips..  Past Medical History:  Diagnosis Date  . Ankle fracture 2016  . Atrial fibrillation ([place])   . Depression   . Elevated parathyroid hormone   . Fracture of orbital floor with routine healing   . GERD (gastroesophageal reflux disease)   . History of colon polyps    benign  . Hypercalcemia   . Hypertension   . Hyponatremia   . Hypothyroid   . Macular degeneration    wet in the right and dry in the left  . OSA on CPAP   . Osteoporosis   . Peripheral vascular disease ([place])   . PSVT (paroxysmal supraventricular tachycardia) ([place])   . Rotator cuff tear   . Sinus node dysfunction ([facility])    a. s/p MDT pacemaker  . Stroke ([place])   . Urinary incontinence   . Wrist fracture 2018   Past Surgical History:  Procedure Laterality Date  . BACK SURGERY    . CATARACT EXTRACTION    . CATARACT EXTRACTION W/ INTRAOCULAR LENS  IMPLANT, BILATERAL Bilateral   . COLONOSCOPY    . ESOPHAGOGASTRODUODENOSCOPY    . IR ANGIO INTRA EXTRACRAN SEL COM CAROTID INNOMINATE BILAT MOD SED  12/30/2017  . IR ANGIO VERTEBRAL SEL VERTEBRAL BILAT MOD SED  12/30/2017  . LAPAROSCOPIC CHOLECYSTECTOMY  08/11/1999  . LUMBAR DISC SURGERY  02/2014  . ORIF ANKLE FRACTURE Left 04/13/2015   Procedure: OPEN REDUCTION INTERNAL FIXATION (ORIF) LEFT ANKLE FRACTURE;  Surgeon: Meredith Pel, MD;  Location: [facility];  Service: Orthopedics;  Laterality: Left;  . PACEMAKER PLACEMENT Right 2012   a. MDT dual chamber PPM implanted by Dr Caryl Comes   . SHOULDER ARTHROSCOPY W/ ROTATOR CUFF REPAIR Right 08/30/2014   WITH MINI-OPEN ROTATOR CUFF REPAIR AND SUBACROMIAL DECOMPRESSION  . SHOULDER ARTHROSCOPY WITH ROTATOR CUFF REPAIR AND SUBACROMIAL DECOMPRESSION Right 08/30/2014   Procedure: SHOULDER ARTHROSCOPY WITH MINI-OPEN ROTATOR CUFF REPAIR AND SUBACROMIAL DECOMPRESSION;  Surgeon: Meredith Pel, MD;  Location: [facility];  Service: Orthopedics;  Laterality: Right;  RIGHT SHOULDER DIAGNOSTIC OPERATIVE ARTHROSCOPY, SUBACROMIAL DECOMPRESSION, MINI-OPEN ROTATOR CUFF REPAIR.  Marland Kitchen VAGINAL HYSTERECTOMY  1970   Social History   Socioeconomic History  . Marital status: Married    Spouse name: Not on file  . Number of children: 4  . Years of education: 21  . Highest education level: Not on file  Occupational History  . Occupation: Clinical research associate     Comment: retired  Tobacco Use  . Smoking status: Former Smoker    Packs/day: 1.00    Years: 45.00    Pack years: 45.00    Types: Cigarettes    Quit date: 1992    Years since quitting: 30.0  . Smokeless tobacco: Never Used  . Tobacco comment: quit smoking in 1992  Vaping Use  . Vaping Use: Never used  Substance and Sexual Activity  . Alcohol use: Yes    Alcohol/week: 28.0 standard drinks    Types: 28 Glasses of wine per week    Comment: 08/30/2014 "16oz wine q night"  . Drug use: No  . Sexual activity: Yes    Birth control/protection: Surgical  Other Topics Concern  . Not on file  Social History Narrative   Brittany [name] lives with her husband at [employer]. They are retired and recently relocated to [facility] from [place].  She has 4 grown children.    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   Social Determinants of Health   Financial Resource Strain: Not on file  Food Insecurity: Not on file  Transportation Needs: Not on file  Physical Activity: Not on file  Stress: Not on file  Social Connections: Not on file   Family History  Problem Relation Age of Onset  . Colon cancer Mother        colon  . Cancer Father        unknown type  . Stroke Sister   . Skin cancer Brother   . Heart disease Brother   . Breast cancer Paternal Aunt   . Asthma Brother    Allergies  Allergen Reactions  . Adhesive [Tape] Swelling and Rash  . Benzalkonium Chloride Rash    Pt was not aware of  this allergy  . Neosporin [Neomycin-Polymyxin-Gramicidin] Swelling and Rash   Prior to Admission medications   Medication Sig Start Date End Date Taking? Authorizing Provider  acetaminophen (TYLENOL) 500 MG tablet Take 500 mg by mouth 2 (two) times daily.     [provider]  alendronate (FOSAMAX) 70 MG tablet Take 1 tablet (70 mg total) by mouth every 7 (seven) days. Take with a full glass of water on an empty stomach. 03/04/17   Kuneff, Renee A, DO  apixaban (ELIQUIS) 5 MG TABS tablet Take 1 tablet (5 mg total) by mouth 2 (two) times daily. 05/02/20   Duke Salvia, MD  atorvastatin (LIPITOR) 40 MG tablet Take 1 tablet (40 mg total) by mouth daily at 6 PM. 01/01/18   Nyanor, Karie Soda, NP  B Complex-C (B-COMPLEX WITH VITAMIN C) tablet Take 1 tablet by mouth daily with lunch.     [provider]  Cholecalciferol (VITAMIN D3) 5000 UNITS CAPS Take 5,000 Units by mouth daily with breakfast.     [provider]  desmopressin (DDAVP) 0.2 MG tablet Take 0.2 mg by mouth daily as needed (to control bladder on car trip).     [provider]  diltiazem (CARDIZEM) 120 MG tablet Take 120 mg by mouth in the morning and at bedtime.    [provider]  dofetilide (TIKOSYN) 500 MCG capsule TAKE ONE CAPSULE BY MOUTH TWICE DAILY 04/18/20   Duke Salvia, MD  lisinopril (PRINIVIL,ZESTRIL) 20 MG tablet Take 1 tablet (20 mg total) by mouth daily. 08/22/17   Kuneff, Renee A, DO  Lutein 20 MG TABS Take 20 mg by mouth daily with breakfast.     [provider]  magnesium oxide (MAG-OX) 400 MG tablet Take 1 tablet (400 mg total) by mouth daily. 03/21/14   Duke Salvia, MD  metoprolol succinate (TOPROL-XL) 50 MG 24 hr tablet Take 2 tablets by mouth twice daily with or immediately following meals 08/01/20   Duke Salvia, MD  Multiple Vitamin (MULTIVITAMIN WITH MINERALS) TABS tablet Take 1 tablet by mouth daily with breakfast.     [provider]  Multiple  Vitamins-Minerals (PRESERVISION AREDS 2 PO) Take 1 capsule by mouth 2 (two) times daily with a meal.     [provider]  PRESCRIPTION MEDICATION Inhale into the lungs at bedtime. CPAP    [provider]  sertraline (ZOLOFT) 50 MG tablet Take 1 tablet (50 mg total) by mouth daily. 03/22/19 06/21/20  Jenipher Havel Nip, NP     Positive ROS: Otherwise negative  All other systems have  been reviewed and were otherwise negative with the exception of those mentioned in the HPI and as above.  Physical Exam: Constitutional: Alert, well-appearing, no acute distress Ears: External ears without lesions or tenderness.  She has large amount of wax embedding in the left ear canal adjacent to the TM that was cleaned with hydroperoxide and suction as well as forceps.  The right ear canal revealed minimal wax buildup that was removed with a curette.  TMs are clear bilaterally. Nasal: External nose without lesions. Septum midline.. Clear nasal passages Oral: Lips and gums without lesions. Tongue and palate mucosa without lesions. Posterior oropharynx clear. Neck: No palpable adenopathy or masses Respiratory: Breathing comfortably  Skin: No facial/neck lesions or rash noted.  Cerumen impaction removal  Date/Time: 08/18/2020 5:22 PM Performed by: Rozetta Nunnery, MD Authorized by: Rozetta Nunnery, MD   Consent:    Consent obtained:  Verbal   Consent given by:  Patient   Risks discussed:  Pain and bleeding Procedure details:    Location:  L ear and R ear   Procedure type: curette, suction and forceps   Post-procedure details:    Inspection:  TM intact and canal normal   Hearing quality:  Improved   Patient tolerance of procedure:  Tolerated well, no immediate complications Comments:     TMs are clear bilaterally.    Assessment: Bilateral cerumen buildup  Plan: Recommended not using Q-tips in her ears. She will follow-up as needed.   Radene Journey, MD

## 2020-08-21 DIAGNOSIS — I10 Essential (primary) hypertension: Secondary | ICD-10-CM | POA: Diagnosis not present

## 2020-08-23 ENCOUNTER — Encounter (INDEPENDENT_AMBULATORY_CARE_PROVIDER_SITE_OTHER): Payer: Self-pay

## 2020-08-28 ENCOUNTER — Ambulatory Visit (INDEPENDENT_AMBULATORY_CARE_PROVIDER_SITE_OTHER): Payer: Medicare Other | Admitting: Otolaryngology

## 2020-08-30 ENCOUNTER — Ambulatory Visit (INDEPENDENT_AMBULATORY_CARE_PROVIDER_SITE_OTHER): Payer: Medicare Other

## 2020-08-30 ENCOUNTER — Telehealth: Payer: Self-pay

## 2020-08-30 DIAGNOSIS — I495 Sick sinus syndrome: Secondary | ICD-10-CM | POA: Diagnosis not present

## 2020-08-30 NOTE — Telephone Encounter (Signed)
I let the patient know we did not get the transmission. I called tech support to get additional help. She is going to try to send the transmission again.

## 2020-09-01 LAB — CUP PACEART REMOTE DEVICE CHECK
Battery Impedance: 3000 Ohm
Battery Remaining Longevity: 19 mo
Battery Voltage: 2.73 V
Brady Statistic AP VP Percent: 5 %
Brady Statistic AP VS Percent: 77 %
Brady Statistic AS VP Percent: 5 %
Brady Statistic AS VS Percent: 13 %
Date Time Interrogation Session: 20220209111221
Implantable Lead Implant Date: 20121213
Implantable Lead Implant Date: 20121213
Implantable Lead Location: 753859
Implantable Lead Location: 753860
Implantable Lead Model: 4076
Implantable Lead Model: 4076
Implantable Lead Serial Number: 835025
Implantable Lead Serial Number: 869269
Implantable Pulse Generator Implant Date: 20121213
Lead Channel Impedance Value: 386 Ohm
Lead Channel Impedance Value: 546 Ohm
Lead Channel Pacing Threshold Amplitude: 0.5 V
Lead Channel Pacing Threshold Amplitude: 0.625 V
Lead Channel Pacing Threshold Pulse Width: 0.4 ms
Lead Channel Pacing Threshold Pulse Width: 0.4 ms
Lead Channel Setting Pacing Amplitude: 2 V
Lead Channel Setting Pacing Amplitude: 2.5 V
Lead Channel Setting Pacing Pulse Width: 0.4 ms
Lead Channel Setting Sensing Sensitivity: 5.6 mV

## 2020-09-04 DIAGNOSIS — I10 Essential (primary) hypertension: Secondary | ICD-10-CM | POA: Diagnosis not present

## 2020-09-04 DIAGNOSIS — I4891 Unspecified atrial fibrillation: Secondary | ICD-10-CM | POA: Diagnosis not present

## 2020-09-04 DIAGNOSIS — M810 Age-related osteoporosis without current pathological fracture: Secondary | ICD-10-CM | POA: Diagnosis not present

## 2020-09-04 DIAGNOSIS — E785 Hyperlipidemia, unspecified: Secondary | ICD-10-CM | POA: Diagnosis not present

## 2020-09-05 NOTE — Progress Notes (Signed)
Remote pacemaker transmission.   

## 2020-09-20 DIAGNOSIS — L570 Actinic keratosis: Secondary | ICD-10-CM | POA: Diagnosis not present

## 2020-09-20 DIAGNOSIS — D225 Melanocytic nevi of trunk: Secondary | ICD-10-CM | POA: Diagnosis not present

## 2020-09-20 DIAGNOSIS — X32XXXD Exposure to sunlight, subsequent encounter: Secondary | ICD-10-CM | POA: Diagnosis not present

## 2020-09-20 DIAGNOSIS — L820 Inflamed seborrheic keratosis: Secondary | ICD-10-CM | POA: Diagnosis not present

## 2020-09-20 DIAGNOSIS — M1711 Unilateral primary osteoarthritis, right knee: Secondary | ICD-10-CM | POA: Diagnosis not present

## 2020-09-20 DIAGNOSIS — M1712 Unilateral primary osteoarthritis, left knee: Secondary | ICD-10-CM | POA: Diagnosis not present

## 2020-09-26 ENCOUNTER — Other Ambulatory Visit (HOSPITAL_COMMUNITY): Payer: Self-pay | Admitting: Interventional Radiology

## 2020-09-26 ENCOUNTER — Telehealth (HOSPITAL_COMMUNITY): Payer: Self-pay

## 2020-09-26 DIAGNOSIS — I771 Stricture of artery: Secondary | ICD-10-CM

## 2020-09-26 NOTE — Telephone Encounter (Signed)
Called to schedule f/u us carotid, no answer, left vm. AW 

## 2020-10-04 ENCOUNTER — Telehealth (HOSPITAL_COMMUNITY): Payer: Self-pay

## 2020-10-04 ENCOUNTER — Ambulatory Visit (HOSPITAL_COMMUNITY)
Admission: RE | Admit: 2020-10-04 | Discharge: 2020-10-04 | Disposition: A | Discharge status: Home / Self Care | Payer: Medicare Other | Source: Ambulatory Visit | Attending: Interventional Radiology | Admitting: Interventional Radiology

## 2020-10-04 ENCOUNTER — Other Ambulatory Visit: Payer: Self-pay

## 2020-10-04 DIAGNOSIS — I771 Stricture of artery: Secondary | ICD-10-CM | POA: Diagnosis not present

## 2020-10-04 NOTE — Telephone Encounter (Signed)
Pt agreed to f/u in 6 months with us carotid. AW 

## 2020-10-22 ENCOUNTER — Other Ambulatory Visit (HOSPITAL_COMMUNITY): Payer: Self-pay | Admitting: Nurse Practitioner

## 2020-10-25 ENCOUNTER — Telehealth: Payer: Self-pay | Admitting: Internal Medicine

## 2020-10-25 ENCOUNTER — Other Ambulatory Visit (HOSPITAL_COMMUNITY): Payer: Self-pay | Admitting: Nurse Practitioner

## 2020-10-25 NOTE — Telephone Encounter (Signed)
Patient called and said she thought that her medication diltiazem (CARDIZEM) 120 MG tablet was increased to 180 mg. I checked to see and did not see that it was changed. Please call back to veriffy

## 2020-10-26 ENCOUNTER — Telehealth: Payer: Self-pay | Admitting: Internal Medicine

## 2020-10-26 NOTE — Telephone Encounter (Signed)
Spoke with pt who is asking if she is supposed to be taking Diltiazem 180mg .  Pt advised according to [facility] she has been taking Diltiazem 120mg  - 1 tablet by mouth twice daily.  It is listed as prescribed by historical provider.  Dr Olin Pia last note advised pt can take an extra diltiazem or metoprolol for tachy-palpitations.  Pt reports she has actually been taking Diltiazem 180mg  and tolerating without any problems.  She has thrown the bottle away so she is not sure who prescribed medication.  She states the pharmacy will be contacting prescriber because she is due for a refill.  Pt advised once medication is refilled to please call to update MAR.  Pt verbalizes understanding and and thanked Therapist, sports for the call.

## 2020-10-26 NOTE — Telephone Encounter (Signed)
*  STAT* If patient is at the pharmacy, call can be transferred to refill team.   1. Which medications need to be refilled? (please list name of each medication and dose if known) diltiazem (CARDIZEM) 120 MG tablet  2. Which pharmacy/location (including street and city if local pharmacy) is medication to be sent to? [facility] # [place], [place]  3. Do they need a 30 day or 90 day supply? 90 day supply   Patient is completely out of medication.

## 2020-10-27 MED ORDER — DILTIAZEM HCL ER COATED BEADS 120 MG PO CP24: 120.0000 mg | ORAL_CAPSULE | Freq: Two times a day (BID) | ORAL | 2 refills | Status: DC

## 2020-11-15 DIAGNOSIS — D6869 Other thrombophilia: Secondary | ICD-10-CM | POA: Diagnosis not present

## 2020-11-15 DIAGNOSIS — Z8673 Personal history of transient ischemic attack (TIA), and cerebral infarction without residual deficits: Secondary | ICD-10-CM | POA: Diagnosis not present

## 2020-11-15 DIAGNOSIS — M810 Age-related osteoporosis without current pathological fracture: Secondary | ICD-10-CM | POA: Diagnosis not present

## 2020-11-15 DIAGNOSIS — I10 Essential (primary) hypertension: Secondary | ICD-10-CM | POA: Diagnosis not present

## 2020-11-15 DIAGNOSIS — I4891 Unspecified atrial fibrillation: Secondary | ICD-10-CM | POA: Diagnosis not present

## 2020-11-15 DIAGNOSIS — E785 Hyperlipidemia, unspecified: Secondary | ICD-10-CM | POA: Diagnosis not present

## 2020-11-15 DIAGNOSIS — R7303 Prediabetes: Secondary | ICD-10-CM | POA: Diagnosis not present

## 2020-11-15 DIAGNOSIS — G4733 Obstructive sleep apnea (adult) (pediatric): Secondary | ICD-10-CM | POA: Diagnosis not present

## 2020-11-15 DIAGNOSIS — Z0000 Encounter for general adult medical examination without abnormal findings: Secondary | ICD-10-CM | POA: Diagnosis not present

## 2020-11-15 DIAGNOSIS — N3281 Overactive bladder: Secondary | ICD-10-CM | POA: Diagnosis not present

## 2020-11-15 DIAGNOSIS — F411 Generalized anxiety disorder: Secondary | ICD-10-CM | POA: Diagnosis not present

## 2020-11-15 DIAGNOSIS — Z1389 Encounter for screening for other disorder: Secondary | ICD-10-CM | POA: Diagnosis not present

## 2020-11-27 DIAGNOSIS — I10 Essential (primary) hypertension: Secondary | ICD-10-CM | POA: Diagnosis not present

## 2020-11-27 DIAGNOSIS — E785 Hyperlipidemia, unspecified: Secondary | ICD-10-CM | POA: Diagnosis not present

## 2020-11-27 DIAGNOSIS — M1712 Unilateral primary osteoarthritis, left knee: Secondary | ICD-10-CM | POA: Diagnosis not present

## 2020-11-27 DIAGNOSIS — I4891 Unspecified atrial fibrillation: Secondary | ICD-10-CM | POA: Diagnosis not present

## 2020-11-27 DIAGNOSIS — M810 Age-related osteoporosis without current pathological fracture: Secondary | ICD-10-CM | POA: Diagnosis not present

## 2020-11-27 DIAGNOSIS — M1711 Unilateral primary osteoarthritis, right knee: Secondary | ICD-10-CM | POA: Diagnosis not present

## 2020-11-29 ENCOUNTER — Ambulatory Visit (INDEPENDENT_AMBULATORY_CARE_PROVIDER_SITE_OTHER): Payer: Medicare Other

## 2020-11-29 DIAGNOSIS — I495 Sick sinus syndrome: Secondary | ICD-10-CM | POA: Diagnosis not present

## 2020-11-30 LAB — CUP PACEART REMOTE DEVICE CHECK
Battery Impedance: 3207 Ohm
Battery Remaining Longevity: 17 mo
Battery Voltage: 2.72 V
Brady Statistic AP VP Percent: 7 %
Brady Statistic AP VS Percent: 77 %
Brady Statistic AS VP Percent: 5 %
Brady Statistic AS VS Percent: 12 %
Date Time Interrogation Session: 20220511101937
Implantable Lead Implant Date: 20121213
Implantable Lead Implant Date: 20121213
Implantable Lead Location: 753859
Implantable Lead Location: 753860
Implantable Lead Model: 4076
Implantable Lead Model: 4076
Implantable Lead Serial Number: 835025
Implantable Lead Serial Number: 869269
Implantable Pulse Generator Implant Date: 20121213
Lead Channel Impedance Value: 375 Ohm
Lead Channel Impedance Value: 529 Ohm
Lead Channel Pacing Threshold Amplitude: 0.625 V
Lead Channel Pacing Threshold Amplitude: 0.75 V
Lead Channel Pacing Threshold Pulse Width: 0.4 ms
Lead Channel Pacing Threshold Pulse Width: 0.4 ms
Lead Channel Setting Pacing Amplitude: 2 V
Lead Channel Setting Pacing Amplitude: 2.5 V
Lead Channel Setting Pacing Pulse Width: 0.4 ms
Lead Channel Setting Sensing Sensitivity: 4 mV

## 2020-12-11 DIAGNOSIS — M1712 Unilateral primary osteoarthritis, left knee: Secondary | ICD-10-CM | POA: Diagnosis not present

## 2020-12-11 DIAGNOSIS — M1711 Unilateral primary osteoarthritis, right knee: Secondary | ICD-10-CM | POA: Diagnosis not present

## 2020-12-15 DIAGNOSIS — I10 Essential (primary) hypertension: Secondary | ICD-10-CM | POA: Diagnosis not present

## 2020-12-20 DIAGNOSIS — M1711 Unilateral primary osteoarthritis, right knee: Secondary | ICD-10-CM | POA: Diagnosis not present

## 2020-12-20 DIAGNOSIS — M1712 Unilateral primary osteoarthritis, left knee: Secondary | ICD-10-CM | POA: Diagnosis not present

## 2020-12-21 DIAGNOSIS — M25572 Pain in left ankle and joints of left foot: Secondary | ICD-10-CM | POA: Diagnosis not present

## 2020-12-21 NOTE — Progress Notes (Signed)
Remote pacemaker transmission.   

## 2020-12-22 DIAGNOSIS — M25572 Pain in left ankle and joints of left foot: Secondary | ICD-10-CM | POA: Diagnosis not present

## 2020-12-27 DIAGNOSIS — M1712 Unilateral primary osteoarthritis, left knee: Secondary | ICD-10-CM | POA: Diagnosis not present

## 2020-12-27 DIAGNOSIS — M1711 Unilateral primary osteoarthritis, right knee: Secondary | ICD-10-CM | POA: Diagnosis not present

## 2021-01-02 DIAGNOSIS — Z961 Presence of intraocular lens: Secondary | ICD-10-CM | POA: Diagnosis not present

## 2021-01-02 DIAGNOSIS — H35363 Drusen (degenerative) of macula, bilateral: Secondary | ICD-10-CM | POA: Diagnosis not present

## 2021-01-02 DIAGNOSIS — H353134 Nonexudative age-related macular degeneration, bilateral, advanced atrophic with subfoveal involvement: Secondary | ICD-10-CM | POA: Diagnosis not present

## 2021-01-02 DIAGNOSIS — H3554 Dystrophies primarily involving the retinal pigment epithelium: Secondary | ICD-10-CM | POA: Diagnosis not present

## 2021-01-05 ENCOUNTER — Encounter: Payer: Self-pay | Admitting: Internal Medicine

## 2021-01-05 ENCOUNTER — Ambulatory Visit (INDEPENDENT_AMBULATORY_CARE_PROVIDER_SITE_OTHER): Payer: Medicare Other | Admitting: Internal Medicine

## 2021-01-05 ENCOUNTER — Other Ambulatory Visit: Payer: Self-pay

## 2021-01-05 VITALS — BP 146/70 | HR 79 | Ht 68.0 in | Wt 213.6 lb

## 2021-01-05 DIAGNOSIS — R001 Bradycardia, unspecified: Secondary | ICD-10-CM

## 2021-01-05 DIAGNOSIS — R600 Localized edema: Secondary | ICD-10-CM | POA: Diagnosis not present

## 2021-01-05 DIAGNOSIS — I871 Compression of vein: Secondary | ICD-10-CM

## 2021-01-05 DIAGNOSIS — I495 Sick sinus syndrome: Secondary | ICD-10-CM

## 2021-01-05 DIAGNOSIS — M7918 Myalgia, other site: Secondary | ICD-10-CM | POA: Diagnosis not present

## 2021-01-05 DIAGNOSIS — I4819 Other persistent atrial fibrillation: Secondary | ICD-10-CM | POA: Diagnosis not present

## 2021-01-05 DIAGNOSIS — Z950 Presence of cardiac pacemaker: Secondary | ICD-10-CM | POA: Diagnosis not present

## 2021-01-05 NOTE — Progress Notes (Addendum)
Patient ID: Brittany [name], female   DOB: 10/18/1933, 87 y.o.   MRN: [id number]      Patient Care Team: Donald Prose, MD as PCP - General (Family Medicine) Deboraha Sprang, MD as PCP - Electrophysiology (Cardiology) Bjorn Loser, MD as Consulting Physician (Urology) Deboraha Sprang, MD as Consulting Physician (Cardiology) Chesley Mires, MD as Consulting Physician (Pulmonary Disease) Melrose Nakayama, MD as Consulting Physician (Orthopedic Surgery) Antionette Fairy Isaias Cowman, MD as Referring Physician (Ophthalmology) Paulla Dolly Tamala Fothergill, DPM as Consulting Physician (Podiatry)   HPI  Brittany [name] is a 87 y.o. female Seen in followup for  pacer implanted 12/12 for sinus node dysfunction.  She has atrial fibrillation for which she previously took sotalol but more recently has been transitioned to dofetilide.  Anticoagulation with apixaban   Cardiac evaluation has included a Myoview 8/12 which demonstrated no ischemia and normal left ventricular function     Since last visit she is doing fairly well   The patient denies chest pain, shortness of breath, or palpitations nocturnal dyspnea, orthopnea  There have been no lightheadedness or syncope.  Complains of  Left leg edema extends up to the thigh.  she has pain in her left buttocks  She notes she don't walk  as much since she will soon get a  L knee replacement: she notes she will have to get off her Eliquis 5 mg  2 Tab by PO  Daily       DATE TEST EF    8/12  Myoview  Normal LV No ischemia  6/19 Echo   55-60 %                       Date Cr K Mg Hgb   5/17   1.9   1/18  0.59 4.5   14.7  11/18 0.55 4.4  15.8  3/20 0.61 4.4 2.1 12.9  9/20 0.63 4.9 2.0   8/21    13.0  10/21 0.69 4.7      Thromboembolic risk factors include age-74 hypertension-1 for a CHADS-  score of 2 and a CHADS-VASc score of 4   Past Medical History:  Diagnosis Date   Ankle fracture 2016   Atrial fibrillation (HCC)    Depression    Elevated parathyroid  hormone    Fracture of orbital floor with routine healing    GERD (gastroesophageal reflux disease)    History of colon polyps    benign   Hypercalcemia    Hypertension    Hyponatremia    Hypothyroid    Macular degeneration    wet in the right and dry in the left   OSA on CPAP    Osteoporosis    Peripheral vascular disease (HCC)    PSVT (paroxysmal supraventricular tachycardia) (HCC)    Rotator cuff tear    Sinus node dysfunction (HCC)    a. s/p MDT pacemaker   Stroke [facility])    Urinary incontinence    Wrist fracture 2018    Past Surgical History:  Procedure Laterality Date   BACK SURGERY     CATARACT EXTRACTION     CATARACT EXTRACTION W/ INTRAOCULAR LENS  IMPLANT, BILATERAL Bilateral    COLONOSCOPY     ESOPHAGOGASTRODUODENOSCOPY     IR ANGIO INTRA EXTRACRAN SEL COM CAROTID INNOMINATE BILAT MOD SED  12/30/2017   IR ANGIO VERTEBRAL SEL VERTEBRAL BILAT MOD SED  12/30/2017   LAPAROSCOPIC CHOLECYSTECTOMY  08/11/1999   LUMBAR [place] SURGERY  02/2014  ORIF ANKLE FRACTURE Left 04/13/2015   Procedure: OPEN REDUCTION INTERNAL FIXATION (ORIF) LEFT ANKLE FRACTURE;  Surgeon: Meredith Pel, MD;  Location: [place];  Service: Orthopedics;  Laterality: Left;   PACEMAKER PLACEMENT Right 2012   a. MDT dual chamber PPM implanted by Dr Caryl Comes    SHOULDER ARTHROSCOPY W/ ROTATOR CUFF REPAIR Right 08/30/2014   WITH MINI-OPEN ROTATOR CUFF REPAIR AND SUBACROMIAL DECOMPRESSION   SHOULDER ARTHROSCOPY WITH ROTATOR CUFF REPAIR AND SUBACROMIAL DECOMPRESSION Right 08/30/2014   Procedure: SHOULDER ARTHROSCOPY WITH MINI-OPEN ROTATOR CUFF REPAIR AND SUBACROMIAL DECOMPRESSION;  Surgeon: Meredith Pel, MD;  Location: [place];  Service: Orthopedics;  Laterality: Right;  RIGHT SHOULDER DIAGNOSTIC OPERATIVE ARTHROSCOPY, SUBACROMIAL DECOMPRESSION, MINI-OPEN ROTATOR CUFF REPAIR.   VAGINAL HYSTERECTOMY  1970    Current Outpatient Medications  Medication Sig Dispense Refill   acetaminophen (TYLENOL) 500 MG tablet Take  500 mg by mouth 2 (two) times daily.      alendronate (FOSAMAX) 70 MG tablet Take 1 tablet (70 mg total) by mouth every 7 (seven) days. Take with a full glass of water on an empty stomach. 4 tablet 11   apixaban (ELIQUIS) 5 MG TABS tablet Take 1 tablet (5 mg total) by mouth 2 (two) times daily. 180 tablet 3   atorvastatin (LIPITOR) 40 MG tablet Take 1 tablet (40 mg total) by mouth daily at 6 PM. 30 tablet 3   B Complex-C (B-COMPLEX WITH VITAMIN C) tablet Take 1 tablet by mouth daily with lunch.      Cholecalciferol (VITAMIN D3) 5000 UNITS CAPS Take 5,000 Units by mouth daily with breakfast.      desmopressin (DDAVP) 0.2 MG tablet Take 0.2 mg by mouth daily as needed (to control bladder on car trip).      diltiazem (CARDIZEM CD) 120 MG 24 hr capsule Take 1 capsule (120 mg total) by mouth in the morning and at bedtime. 180 capsule 2   dofetilide (TIKOSYN) 500 MCG capsule TAKE ONE CAPSULE BY MOUTH TWICE DAILY 60 capsule 11   lisinopril (PRINIVIL,ZESTRIL) 20 MG tablet Take 1 tablet (20 mg total) by mouth daily. 90 tablet 1   Lutein 20 MG TABS Take 20 mg by mouth daily with breakfast.      magnesium oxide (MAG-OX) 400 MG tablet Take 1 tablet (400 mg total) by mouth daily. 30 tablet 0   metoprolol succinate (TOPROL-XL) 50 MG 24 hr tablet Take 2 tablets by mouth twice daily with or immediately following meals 360 tablet 2   Multiple Vitamin (MULTIVITAMIN WITH MINERALS) TABS tablet Take 1 tablet by mouth daily with breakfast.      Multiple Vitamins-Minerals (PRESERVISION AREDS 2 PO) Take 1 capsule by mouth 2 (two) times daily with a meal.      PRESCRIPTION MEDICATION Inhale into the lungs at bedtime. CPAP     sertraline (ZOLOFT) 50 MG tablet Take 1 tablet (50 mg total) by mouth daily. 30 tablet 2   No current facility-administered medications for this visit.    Allergies  Allergen Reactions   Adhesive [Tape] Swelling and Rash   Benzalkonium Chloride Rash    Pt was not aware of this allergy    Neosporin [Neomycin-Polymyxin-Gramicidin] Swelling and Rash    Review of Systems negative except from HPI and PMH  Physical Exam: BP (!) 146/70   Pulse 79   Ht 5\' 8"  (1.727 m)   Wt 213 lb 9.6 oz (96.9 kg)   LMP 07/22/1968   SpO2 98%   BMI 32.48 kg/m  Well developed and well nourished in no acute distress HENT normal Neck supple with JVP-10  Lugs Clear Device pocket well healed; without hematoma or erythema.  There is no tethering  Regular rate and rhythm, No gallop No  murmur Abd-soft with active BS No Clubbing cyanosis  L leg edema up to the proximal thigh Skin-warm and dry A & Oriented  Grossly normal sensory and motor function   EKG: Atrial pacing at 79 30/11/43   Assessment and  Plan Atrial fibrillation-persistent      Sinus bradycardia   HFpEF   Pacemaker-[employer]tronic  The patient's device was interrogated.  The information was reviewed. No changes were made in the programming.        High Risk Medication Surveillance -Dofetilide  Hypertension  Hyperlipidemia  Left leg edema  Interval atrial fibrillation overall burden about 7% down from 20%.  We will continue the dofetilide 500 mcg twice daily.  Surveillance laboratories are within range  No clinical bleeding.  We will continue the Eliquis 5 mg twice daily.  Blood pressure is reasonably controlled.  We will continue the diltiazem 120 twice daily and lisinopril 20.  Also metoprolol 50 twice daily.  I am concerned about the left leg swelling.  This should not be a clot in the context of her anticoagulation; however, this needs to be excluded with venous Doppler.  The greater concern is that this is related to a outflow issue so we will undertake, having reviewed this with Dr. Alonza Bogus from radiology, pelvic CT with contrast.       I,Stephanie Williams,acting as a scribe for Virl Axe, MD.,have documented all relevant documentation on the behalf of Virl Axe, MD,as directed by  Virl Axe, MD while in  the presence of Virl Axe, MD.  I, Virl Axe, MD, have reviewed all documentation for this visit. The documentation on 01/05/21 for the exam, diagnosis, procedures, and orders are all accurate and complete.

## 2021-01-05 NOTE — Patient Instructions (Addendum)
Medication Instructions:   Your physician recommends that you continue on your current medications as directed. Please refer to the Current Medication list given to you today.   *If you need a refill on your cardiac medications before your next appointment, please call your pharmacy*   Lab Work: None ordered.  If you have labs (blood work) drawn today and your tests are completely normal, you will receive your results only by: [place] (if you have MyChart) OR A paper copy in the mail If you have any lab test that is abnormal or we need to change your treatment, we will call you to review the results.   Testing/Procedures:  Your physician has requested that you have a lower or upper extremity venous duplex. This test is an ultrasound of the veins in the legs or arms. It looks at venous blood flow that carries blood from the heart to the legs or arms. Allow one hour for a Lower Venous exam. Allow thirty minutes for an Upper Venous exam. There are no restrictions or special instructions.      Follow-Up: At [facility], you and your health needs are our priority.  As part of our continuing mission to provide you with exceptional heart care, we have created designated Provider Care Teams.  These Care Teams include your primary Cardiologist (physician) and Advanced Practice Providers (APPs -  Physician Assistants and Nurse Practitioners) who all work together to provide you with the care you need, when you need it.  We recommend signing up for the patient portal called "MyChart".  Sign up information is provided on this After Visit Summary.  MyChart is used to connect with patients for Virtual Visits (Telemedicine).  Patients are able to view lab/test results, encounter notes, upcoming appointments, etc.  Non-urgent messages can be sent to your provider as well.   To learn more about what you can do with MyChart, go to [email or web address].    Your next appointment:   6  month(s)  The format for your next appointment:   In Person  Provider:   Virl Axe, MD   Per Dr Caryl Comes we will review results from Doppler studies before ordering Pelvic CT.

## 2021-01-08 ENCOUNTER — Encounter (HOSPITAL_COMMUNITY): Payer: Medicare Other

## 2021-01-08 ENCOUNTER — Inpatient Hospital Stay (HOSPITAL_COMMUNITY): Admission: RE | Admit: 2021-01-08 | Payer: Medicare Other | Source: Ambulatory Visit

## 2021-01-08 ENCOUNTER — Other Ambulatory Visit: Payer: Self-pay

## 2021-01-08 ENCOUNTER — Ambulatory Visit (HOSPITAL_COMMUNITY)
Admission: RE | Admit: 2021-01-08 | Discharge: 2021-01-08 | Disposition: A | Discharge status: Home / Self Care | Payer: Medicare Other | Source: Ambulatory Visit | Attending: Cardiology | Admitting: Cardiology

## 2021-01-08 DIAGNOSIS — R600 Localized edema: Secondary | ICD-10-CM | POA: Diagnosis not present

## 2021-01-08 NOTE — Addendum Note (Signed)
Addended by: Thora Lance on: 01/08/2021 09:28 AM   Modules accepted: Orders

## 2021-01-09 ENCOUNTER — Telehealth: Payer: Self-pay | Admitting: Internal Medicine

## 2021-01-09 DIAGNOSIS — R600 Localized edema: Secondary | ICD-10-CM

## 2021-01-09 DIAGNOSIS — I872 Venous insufficiency (chronic) (peripheral): Secondary | ICD-10-CM

## 2021-01-09 NOTE — Telephone Encounter (Signed)
[name] is calling requesting to speak with Brittany [name] wanting to know when the CAT scan being discussed to have performed should be scheduled due to her having a test performed yesterday that they discussed was wanted prior to the CAT scan. Please advise.

## 2021-01-10 NOTE — Addendum Note (Signed)
Addended by: Thora Lance on: 01/10/2021 03:40 PM   Modules accepted: Orders

## 2021-01-10 NOTE — Telephone Encounter (Signed)
Spoke with pt and advised office continues to attempt to get approval for pelvic CT and will contact once approved.  Pt verbalizes understanding and thanked Therapist, sports for the phone call.

## 2021-01-10 NOTE — Telephone Encounter (Signed)
Per Dr Caryl Comes after speaking with Vascular team pt may have venous reflux and recommending pt have bilateral lower extremity reflux study.  Order placed and pt notified that she will be contacted for scheduling.  Pt verbalizes understanding and thanked Therapist, sports for the call.

## 2021-01-12 DIAGNOSIS — M1712 Unilateral primary osteoarthritis, left knee: Secondary | ICD-10-CM | POA: Diagnosis not present

## 2021-01-17 ENCOUNTER — Other Ambulatory Visit: Payer: Self-pay | Admitting: Orthopaedic Surgery

## 2021-01-17 ENCOUNTER — Telehealth: Payer: Self-pay | Admitting: Internal Medicine

## 2021-01-17 DIAGNOSIS — Z01818 Encounter for other preprocedural examination: Secondary | ICD-10-CM

## 2021-01-17 NOTE — Telephone Encounter (Signed)
Dr. Caryl Comes, We are asked for cardiac clearance for Brittany [name] for TKA. When you saw her 01/05/21, she had some unilateral LE swelling. LE duplex ruled out DVT. She is schedule for LE venous reflux. You also mentioned a contrasted pelvic CT, which I don't see ordered.   She is unable to complete 4.0 METS, so I can't clear her using our standard protocol.   Do you feel comfortable clearing her for knee surgery following these studies? Does she need an order for a pelvic CT?  I will also sent to pharmD to advise on [facility] hold.  Thank you! Angie

## 2021-01-17 NOTE — Telephone Encounter (Signed)
   [facility] Pre-operative Risk Assessment    Request for surgical clearance:  What type of surgery is being performed? Left knee replacement   When is this surgery scheduled? February 13, 2021   What type of clearance is required (medical clearance vs. Pharmacy clearance to hold med vs. Both)? both  Are there any medications that need to be held prior to surgery and how long? Eliquis    Practice name and name of physician performing surgery? Dr Melrose Nakayama   What is your office phone number [phone]    7.   What is your office fax number    [phone]  8.   Anesthesia type (None, local, MAC, general) ? spinal   Brittany [name] 01/17/2021, 10:00 AM  _________________________________________________________________   (provider comments below)

## 2021-01-17 NOTE — Telephone Encounter (Signed)
Patient with diagnosis of a Fib on eliquis for anticoagulation.    Procedure: Left knee replacement    Date of procedure: 7.26.22   CHA2DS2-VASc Score = 8  This indicates a 10.8% annual risk of stroke. The patient's score is based upon: CHF History: Yes HTN History: Yes Diabetes History: No Stroke History: Yes Vascular Disease History: Yes Age Score: 2 Gender Score: 1   CrCl 74 ml/min Platelet count 260K  Per protocol, recommend holding Eliquis for 3 days for TKA. However patient at elevated risk off anticoagulation and has history of stroke in 2019.  Will route to Dr Caryl Comes for input.

## 2021-01-18 DIAGNOSIS — E785 Hyperlipidemia, unspecified: Secondary | ICD-10-CM | POA: Diagnosis not present

## 2021-01-18 DIAGNOSIS — I4891 Unspecified atrial fibrillation: Secondary | ICD-10-CM | POA: Diagnosis not present

## 2021-01-18 DIAGNOSIS — M810 Age-related osteoporosis without current pathological fracture: Secondary | ICD-10-CM | POA: Diagnosis not present

## 2021-01-18 DIAGNOSIS — I10 Essential (primary) hypertension: Secondary | ICD-10-CM | POA: Diagnosis not present

## 2021-01-18 NOTE — Telephone Encounter (Signed)
Left VM

## 2021-01-18 NOTE — Telephone Encounter (Addendum)
   Name: Brittany [name]  DOB: April 10, 1934  MRN: [id number]   Primary Cardiologist: Virl Axe, MD  Chart reviewed as part of pre-operative protocol coverage. Patient was contacted 01/18/2021 in reference to pre-operative risk assessment for pending surgery as outlined below.  [name] was last seen on 01/05/21 by Dr. Caryl Comes.  Since that day, [name] has done well. She is able to complete 4.0 METS ([employer]) without angina and does not have a history of ischemic heart disease. She is primarily limited by knee pain. She understands she is at increased risk for cardiovascular complications and agrees to proceed. She is pending a venous reflux study on July 11. Clearance based on those results.   Per Dr. Caryl Comes: Would hold eliquis for 48 hrs  if ok with Ortho.   The patient was advised that if she develops new symptoms prior to surgery to contact our office to arrange for a follow-up visit, and she verbalized understanding.  I will route this recommendation to the requesting party via Epic fax function and remove from pre-op pool. Please call with questions.  Tami Lin Joanna Borawski, PA 01/18/2021, 10:19 AM  Venous reflux study 01/29/2021 shows no evidence of venous reflux seen in the left lower extremity.  Therefore, patient is cleared for surgery as explained above.    Caron Presume PA-C 01/31/2021

## 2021-01-18 NOTE — Telephone Encounter (Signed)
Please see comment from Dr Caryl Comes

## 2021-01-29 ENCOUNTER — Ambulatory Visit (HOSPITAL_COMMUNITY)
Admission: RE | Admit: 2021-01-29 | Discharge: 2021-01-29 | Disposition: A | Discharge status: Home / Self Care | Payer: Medicare Other | Source: Ambulatory Visit | Attending: Cardiology | Admitting: Cardiology

## 2021-01-29 ENCOUNTER — Other Ambulatory Visit: Payer: Self-pay

## 2021-01-29 DIAGNOSIS — R600 Localized edema: Secondary | ICD-10-CM

## 2021-01-29 DIAGNOSIS — I872 Venous insufficiency (chronic) (peripheral): Secondary | ICD-10-CM

## 2021-01-30 NOTE — Care Plan (Signed)
Ortho Bundle Case Management Note  Patient Details  Name: Brittany [name] MRN: [id number] Date of Birth: [birth year]  Spoke with patient prior to surgery. She is a resident at [facility] in the independent apartments. SHe will return there with her husband. All therapy will be provided by [facility]. SHe has a walker at home. Patient and MD in agreement with plan. Choice offered.                    DME Arranged:    DME Agency:     HH Arranged:    HH Agency:     Additional Comments: Please contact me with any questions of if this plan should need to change.  Ladell Heads,  [facility]  [phone] 01/30/2021, 11:56 AM

## 2021-02-07 NOTE — Patient Instructions (Signed)
DUE TO COVID-19 ONLY ONE VISITOR IS ALLOWED TO COME WITH YOU AND STAY IN THE WAITING ROOM ONLY DURING PRE OP AND PROCEDURE DAY OF SURGERY. THE 1 VISITOR  MAY VISIT WITH YOU AFTER SURGERY IN YOUR PRIVATE ROOM DURING VISITING HOURS ONLY!               Brittany [name]   Your procedure is scheduled on: 02/13/21   Report to [facility] Main  Entrance   Report to short stay at : 5:15 AM     Call this number if you have problems the morning of surgery [phone]    Remember: NO SOLID FOOD AFTER MIDNIGHT THE NIGHT PRIOR TO SURGERY. NOTHING BY MOUTH EXCEPT CLEAR LIQUIDS UNTIL : 4:30 AM. PLEASE FINISH ENSURE DRINK PER SURGEON ORDER  WHICH NEEDS TO BE COMPLETED AT : 4:30 AM.   CLEAR LIQUID DIET  Foods Allowed                                                                     Foods Excluded  Coffee and tea, regular and decaf                             liquids that you cannot  Plain Jell-O any favor except red or purple                                           see through such as: Fruit ices (not with fruit pulp)                                     milk, soups, orange juice  Iced Popsicles                                    All solid food Carbonated beverages, regular and diet                                    Cranberry, grape and apple juices Sports drinks like Gatorade Lightly seasoned clear broth or consume(fat free) Sugar, honey syrup  Sample Menu Breakfast                                Lunch                                     Supper Cranberry juice                    Beef broth                            Chicken broth Jell-O  Grape juice                           Apple juice Coffee or tea                        Jell-O                                      Popsicle                                                Coffee or tea                        Coffee or tea  _____________________________________________________________________  BRUSH  YOUR TEETH MORNING OF SURGERY AND RINSE YOUR MOUTH OUT, NO CHEWING GUM CANDY OR MINTS.    Take these medicines the morning of surgery with A SIP OF WATER: Tikosyn,sertraline,metoprolol,diltiazem.                               You may not have any metal on your body including hair pins and              piercings  Do not wear jewelry, make-up, lotions, powders or perfumes, deodorant             Do not wear nail polish on your fingernails.  Do not shave  48 hours prior to surgery.    Do not bring valuables to the hospital. [place].  Contacts, dentures or bridgework may not be worn into surgery.  Leave suitcase in the car. After surgery it may be brought to your room.     Patients discharged the day of surgery will not be allowed to drive home. IF YOU ARE HAVING SURGERY AND GOING HOME THE SAME DAY, YOU MUST HAVE AN ADULT TO DRIVE YOU HOME AND BE WITH YOU FOR 24 HOURS. YOU MAY GO HOME BY TAXI OR UBER OR ORTHERWISE, BUT AN ADULT MUST ACCOMPANY YOU HOME AND STAY WITH YOU FOR 24 HOURS.  Name and phone number of your driver:  Special Instructions: N/A              Please read over the following fact sheets you were given: _____________________________________________________________________           [facility] - Preparing for Surgery Before surgery, you can play an important role.  Because skin is not sterile, your skin needs to be as free of germs as possible.  You can reduce the number of germs on your skin by washing with CHG (chlorahexidine gluconate) soap before surgery.  CHG is an antiseptic cleaner which kills germs and bonds with the skin to continue killing germs even after washing. Please DO NOT use if you have an allergy to CHG or antibacterial soaps.  If your skin becomes reddened/irritated stop using the CHG and inform your nurse when you arrive at Short Stay. Do not shave (including legs and underarms) for at least 48 hours prior to  the first  CHG shower.  You may shave your face/neck. Please follow these instructions carefully:  1.  Shower with CHG Soap the night before surgery and the  morning of Surgery.  2.  If you choose to wash your hair, wash your hair first as usual with your  normal  shampoo.  3.  After you shampoo, rinse your hair and body thoroughly to remove the  shampoo.                           4.  Use CHG as you would any other liquid soap.  You can apply chg directly  to the skin and wash                       Gently with a scrungie or clean washcloth.  5.  Apply the CHG Soap to your body ONLY FROM THE NECK DOWN.   Do not use on face/ open                           Wound or open sores. Avoid contact with eyes, ears mouth and genitals (private parts).                       Wash face,  Genitals (private parts) with your normal soap.             6.  Wash thoroughly, paying special attention to the area where your surgery  will be performed.  7.  Thoroughly rinse your body with warm water from the neck down.  8.  DO NOT shower/wash with your normal soap after using and rinsing off  the CHG Soap.                9.  Pat yourself dry with a clean towel.            10.  Wear clean pajamas.            11.  Place clean sheets on your bed the night of your first shower and do not  sleep with pets. Day of Surgery : Do not apply any lotions/deodorants the morning of surgery.  Please wear clean clothes to the hospital/surgery center.  FAILURE TO FOLLOW THESE INSTRUCTIONS MAY RESULT IN THE CANCELLATION OF YOUR SURGERY PATIENT SIGNATURE_________________________________  NURSE SIGNATURE__________________________________  ________________________________________________________________________   Brittany [name]  An incentive spirometer is a tool that can help keep your lungs clear and active. This tool measures how well you are filling your lungs with each breath. Taking long deep breaths may help reverse or  decrease the chance of developing breathing (pulmonary) problems (especially infection) following: A long period of time when you are unable to move or be active. BEFORE THE PROCEDURE  If the spirometer includes an indicator to show your best effort, your nurse or respiratory therapist will set it to a desired goal. If possible, sit up straight or lean slightly forward. Try not to slouch. Hold the incentive spirometer in an upright position. INSTRUCTIONS FOR USE  Sit on the edge of your bed if possible, or sit up as far as you can in bed or on a chair. Hold the incentive spirometer in an upright position. Breathe out normally. Place the mouthpiece in your mouth and seal your lips tightly around it. Breathe in slowly and as deeply as possible, raising the piston or the ball toward  the top of the column. Hold your breath for 3-5 seconds or for as long as possible. Allow the piston or ball to fall to the bottom of the column. Remove the mouthpiece from your mouth and breathe out normally. Rest for a few seconds and repeat Steps 1 through 7 at least 10 times every 1-2 hours when you are awake. Take your time and take a few normal breaths between deep breaths. The spirometer may include an indicator to show your best effort. Use the indicator as a goal to work toward during each repetition. After each set of 10 deep breaths, practice coughing to be sure your lungs are clear. If you have an incision (the cut made at the time of surgery), support your incision when coughing by placing a pillow or rolled up towels firmly against it. Once you are able to get out of bed, walk around indoors and cough well. You may stop using the incentive spirometer when instructed by your caregiver.  RISKS AND COMPLICATIONS Take your time so you do not get dizzy or light-headed. If you are in pain, you may need to take or ask for pain medication before doing incentive spirometry. It is harder to take a deep breath if you  are having pain. AFTER USE Rest and breathe slowly and easily. It can be helpful to keep track of a log of your progress. Your caregiver can provide you with a simple table to help with this. If you are using the spirometer at home, follow these instructions: [place] IF:  You are having difficultly using the spirometer. You have trouble using the spirometer as often as instructed. Your pain medication is not giving enough relief while using the spirometer. You develop fever of 100.5 F (38.1 C) or higher. SEEK IMMEDIATE MEDICAL CARE IF:  You cough up bloody sputum that had not been present before. You develop fever of 102 F (38.9 C) or greater. You develop worsening pain at or near the incision site. MAKE SURE YOU:  Understand these instructions. Will watch your condition. Will get help right away if you are not doing well or get worse. Document Released: 11/18/2006 Document Revised: 09/30/2011 Document Reviewed: 01/19/2007 [facility] Patient Information 2014 [place], [place].   ________________________________________________________________________

## 2021-02-08 ENCOUNTER — Encounter: Payer: Self-pay | Admitting: Internal Medicine

## 2021-02-08 ENCOUNTER — Ambulatory Visit (HOSPITAL_COMMUNITY)
Admission: RE | Admit: 2021-02-08 | Discharge: 2021-02-08 | Disposition: A | Discharge status: Home / Self Care | Payer: Medicare Other | Source: Ambulatory Visit | Attending: Orthopaedic Surgery | Admitting: Orthopaedic Surgery

## 2021-02-08 ENCOUNTER — Encounter (HOSPITAL_COMMUNITY)
Admission: RE | Admit: 2021-02-08 | Discharge: 2021-02-08 | Disposition: A | Discharge status: Home / Self Care | Payer: Medicare Other | Source: Ambulatory Visit | Attending: Orthopaedic Surgery | Admitting: Orthopaedic Surgery

## 2021-02-08 ENCOUNTER — Encounter (HOSPITAL_COMMUNITY): Payer: Self-pay

## 2021-02-08 ENCOUNTER — Other Ambulatory Visit: Payer: Self-pay

## 2021-02-08 DIAGNOSIS — Z01818 Encounter for other preprocedural examination: Secondary | ICD-10-CM | POA: Insufficient documentation

## 2021-02-08 DIAGNOSIS — M1712 Unilateral primary osteoarthritis, left knee: Secondary | ICD-10-CM | POA: Insufficient documentation

## 2021-02-08 DIAGNOSIS — Z7983 Long term (current) use of bisphosphonates: Secondary | ICD-10-CM | POA: Insufficient documentation

## 2021-02-08 DIAGNOSIS — Z7901 Long term (current) use of anticoagulants: Secondary | ICD-10-CM | POA: Insufficient documentation

## 2021-02-08 DIAGNOSIS — I10 Essential (primary) hypertension: Secondary | ICD-10-CM | POA: Diagnosis not present

## 2021-02-08 HISTORY — DX: Presence of cardiac pacemaker: Z95.0

## 2021-02-08 HISTORY — DX: Cardiac arrhythmia, unspecified: I49.9

## 2021-02-08 LAB — BASIC METABOLIC PANEL
Anion gap: 8 (ref 5–15)
BUN: 14 mg/dL (ref 8–23)
CO2: 27 mmol/L (ref 22–32)
Calcium: 10.4 mg/dL — ABNORMAL HIGH (ref 8.9–10.3)
Chloride: 103 mmol/L (ref 98–111)
Creatinine, Ser: 0.64 mg/dL (ref 0.44–1.00)
GFR, Estimated: >60 mL/min (ref >60)
Glucose, Bld: 97 mg/dL (ref 70–99)
Potassium: 3.9 mmol/L (ref 3.5–5.1)
Sodium: 138 mmol/L (ref 135–145)

## 2021-02-08 LAB — CBC WITH DIFFERENTIAL/PLATELET
Abs Immature Granulocytes: 0.03 10*3/uL (ref 0.00–0.07)
Basophils Absolute: 0 10*3/uL (ref 0.0–0.1)
Basophils Relative: 0 %
Eosinophils Absolute: 0.2 10*3/uL (ref 0.0–0.5)
Eosinophils Relative: 3 %
HCT: 43.5 % (ref 36.0–46.0)
Hemoglobin: 14.1 g/dL (ref 12.0–15.0)
Immature Granulocytes: 0 %
Lymphocytes Relative: 20 %
Lymphs Abs: 1.4 10*3/uL (ref 0.7–4.0)
MCH: 31.1 pg (ref 26.0–34.0)
MCHC: 32.4 g/dL (ref 30.0–36.0)
MCV: 96 fL (ref 80.0–100.0)
Monocytes Absolute: 1 10*3/uL (ref 0.1–1.0)
Monocytes Relative: 14 %
Neutro Abs: 4.4 10*3/uL (ref 1.7–7.7)
Neutrophils Relative %: 63 %
Platelets: 310 10*3/uL (ref 150–400)
RBC: 4.53 MIL/uL (ref 3.87–5.11)
RDW: 13.4 % (ref 11.5–15.5)
WBC: 7 10*3/uL (ref 4.0–10.5)
nRBC: 0 % (ref 0.0–0.2)

## 2021-02-08 LAB — PROTIME-INR
INR: 1.1 (ref 0.8–1.2)
Prothrombin Time: 14.6 seconds (ref 11.4–15.2)

## 2021-02-08 LAB — URINALYSIS, ROUTINE W REFLEX MICROSCOPIC
Bacteria, UA: NONE SEEN
Bilirubin Urine: NEGATIVE
Glucose, UA: NEGATIVE mg/dL
Ketones, ur: NEGATIVE mg/dL
Nitrite: NEGATIVE
Protein, ur: NEGATIVE mg/dL
Specific Gravity, Urine: 1.008 (ref 1.005–1.030)
pH: 7 (ref 5.0–8.0)

## 2021-02-08 LAB — SURGICAL PCR SCREEN
MRSA, PCR: NEGATIVE
Staphylococcus aureus: NEGATIVE

## 2021-02-08 LAB — APTT: aPTT: 35 seconds (ref 24–36)

## 2021-02-08 NOTE — Progress Notes (Signed)
COVID Vaccine Completed: Yes x 4 Date COVID Vaccine completed: 11/08/20 COVID vaccine manufacturer:   Moderna     PCP - Dr. Donald Prose. LOV: 12/21/20 Cardiologist - Dr. Virl Axe. LOV: 01/05/21. Clearance: Fabian Sharp: PA: 01/18/21 EPIC  Chest x-ray -  EKG - 01/05/21 EPIC Stress Test -  ECHO -  Cardiac Cath -  Pacemaker/ICD device last checked: 01/05/21  Sleep Study - Yes CPAP - Yes  Fasting Blood Sugar -  Checks Blood Sugar _____ times a day  Blood Thinner Instructions:  Dr. Caryl Comes Aspirin Instructions:Eliquis will be held 2 days before surgery. Last Dose:  Anesthesia review: Hx: Afib,OSA(CPAP),HTN,PSVT,Stroke.Pt. is unable to use stairs,limited mobility.  Patient denies shortness of breath, fever, cough and chest pain at PAT appointment   Patient verbalized understanding of instructions that were given to them at the PAT appointment. Patient was also instructed that they will need to review over the PAT instructions again at home before surgery. COVID Vaccine Completed: Date COVID Vaccine completed: COVID vaccine manufacturer: [place]   PCP -  Cardiologist -   Chest x-ray -  EKG -  Stress Test -  ECHO -  Cardiac Cath -  Pacemaker/ICD device last checked:  Sleep Study -  CPAP -   Fasting Blood Sugar -  Checks Blood Sugar _____ times a day  Blood Thinner Instructions: Aspirin Instructions: Last Dose:  Anesthesia review:   Patient denies shortness of breath, fever, cough and chest pain at PAT appointment   Patient verbalized understanding of instructions that were given to them at the PAT appointment. Patient was also instructed that they will need to review over the PAT instructions again at home before surgery.

## 2021-02-08 NOTE — Progress Notes (Signed)
PERIOPERATIVE PRESCRIPTION FOR IMPLANTED CARDIAC DEVICE PROGRAMMING  Patient Information: Name:  Brittany [name]  DOB:  05-Jun-1934  MRN:  [id number]    Planned Procedure: left total knee arthroplasty.  Surgeon:  Dr. Melrose Nakayama  Date of Procedure:  02/13/21  Cautery will be used.  Position during surgery:     Please send documentation back to:  [facility] (Fax # [phone])   Device Information:  Clinic EP Physician:  Virl Axe, MD   Device Type:  Pacemaker Manufacturer and Phone #:  Medtronic: [id number] Pacemaker Dependent?:  Yes.   Date of Last Device Check:  01/05/21 Normal Device Function?:  Yes.    Electrophysiologist's Recommendations:  Have magnet available. Provide continuous ECG monitoring when magnet is used or reprogramming is to be performed.  Procedure should not interfere with device function.  No device programming or magnet placement needed.  Per Device Clinic Standing Orders, Simone Curia, RN  2:11 PM 02/08/2021

## 2021-02-09 DIAGNOSIS — E785 Hyperlipidemia, unspecified: Secondary | ICD-10-CM | POA: Diagnosis not present

## 2021-02-09 DIAGNOSIS — M810 Age-related osteoporosis without current pathological fracture: Secondary | ICD-10-CM | POA: Diagnosis not present

## 2021-02-09 DIAGNOSIS — I4891 Unspecified atrial fibrillation: Secondary | ICD-10-CM | POA: Diagnosis not present

## 2021-02-09 DIAGNOSIS — I10 Essential (primary) hypertension: Secondary | ICD-10-CM | POA: Diagnosis not present

## 2021-02-12 MED ORDER — BUPIVACAINE LIPOSOME 1.3 % IJ SUSP
20.0000 mL | INTRAMUSCULAR | Status: DC
  Filled 2021-02-12: qty 20

## 2021-02-12 MED ORDER — TRANEXAMIC ACID 1000 MG/10ML IV SOLN
2000.0000 mg | INTRAVENOUS | Status: DC
  Filled 2021-02-12: qty 20

## 2021-02-12 NOTE — Progress Notes (Signed)
Anesthesia Chart Review   Case: [id number] Date/Time: 02/13/21 0715   Procedure: LEFT TOTAL KNEE ARTHROPLASTY (Left: Knee)   Anesthesia type: Spinal   Pre-op diagnosis: LEFT KNEE DEGENERATIVE JOINT DISEASE   Location: [facility] ROOM 06 / [facility]   Surgeons: Melrose Nakayama, MD       DISCUSSION:86 y.o. former smoker with h/o GERD, HTN, OSA on CPAP, stroke, sinus node dysfunction with pacemaker in place (device orders in 02/08/2021 progress note), left knee djd scheduled for above procedure 02/13/2021 with Dr. Melrose Nakayama.   Per cardiology preoperative evaluation 01/18/2021, "Chart reviewed as part of pre-operative protocol coverage. Patient was contacted 01/18/2021 in reference to pre-operative risk assessment for pending surgery as outlined below.  Brittany [name] was last seen on 01/05/21 by Dr. Caryl Comes.  Since that day, Brittany [name] has done well. She is able to complete 4.0 METS ([employer]) without angina and does not have a history of ischemic heart disease. She is primarily limited by knee pain. She understands she is at increased risk for cardiovascular complications and agrees to proceed. She is pending a venous reflux study on July 11. Clearance based on those results.   Per Dr. Caryl Comes: Would hold eliquis for 48 hrs  if ok with Ortho."  VAS [place] 01/29/21 with no evidence of DVT, no evidence of venous reflux.   Pt advised by cardiology to hold Eliquis for 48 hrs. Current book states spinal for anesthesia type.  Dr. Jerald Kief office made aware pt will not be able to have a spinal due to only holding Eliquis for 48 hours.  VS: BP (!) 143/71   Pulse (!) 57   Temp 36.7 C (Oral)   Ht '5\' 8"'$  (1.727 m)   LMP 07/22/1968   SpO2 98%   BMI 32.48 kg/m   PROVIDERS: Donald Prose, MD is PCP   Virl Axe, MD is Cardiologist  LABS: Labs reviewed: Acceptable for surgery. (all labs ordered are listed, but only abnormal results are displayed)  Labs Reviewed  BASIC METABOLIC PANEL - Abnormal;  Notable for the following components:      Result Value   Calcium 10.4 (*)    All other components within normal limits  URINALYSIS, ROUTINE W REFLEX MICROSCOPIC - Abnormal; Notable for the following components:   Color, Urine STRAW (*)    Hgb urine dipstick SMALL (*)    Leukocytes,Ua SMALL (*)    All other components within normal limits  SURGICAL PCR SCREEN  CBC WITH DIFFERENTIAL/PLATELET  PROTIME-INR  APTT  TYPE AND SCREEN     IMAGES:   EKG:   CV: Echo 12/31/2017 Study Conclusions   - Left ventricle: The cavity size was normal. Wall thickness was    normal. Systolic function was normal. The estimated ejection    fraction was in the range of 55% to 60%. Wall motion was normal;    there were no regional wall motion abnormalities. There was a    reduced contribution of atrial contraction to ventricular    filling, due to increased ventricular diastolic pressure or    atrial contractile dysfunction (consider atrial mechanical    stunning after recentt atrial fibrillation).  - Aortic valve: There was mild regurgitation.  - Left atrium: The atrium was mildly dilated.   Stress Test 03/22/2011 Impression:  No scintigraphic evidence or reversible ischemia Estimated ejection fraction is 74% Past Medical History:  Diagnosis Date   Ankle fracture 2016   Arthritis    Atrial fibrillation ([place])  Depression    Dysrhythmia    Elevated parathyroid hormone    Fracture of orbital floor with routine healing    GERD (gastroesophageal reflux disease)    History of colon polyps    benign   Hypercalcemia    Hypertension    Hyponatremia    Hypothyroid    Macular degeneration    wet in the right and dry in the left   OSA on CPAP    Osteoporosis    Peripheral vascular disease (HCC)    Presence of permanent cardiac pacemaker    PSVT (paroxysmal supraventricular tachycardia) (HCC)    Rotator cuff tear    Sinus node dysfunction (HCC)    a. s/p MDT pacemaker   Stroke [facility])     Urinary incontinence    Wrist fracture 2018    Past Surgical History:  Procedure Laterality Date   BACK SURGERY     CATARACT EXTRACTION     CATARACT EXTRACTION W/ INTRAOCULAR LENS  IMPLANT, BILATERAL Bilateral    COLONOSCOPY     ESOPHAGOGASTRODUODENOSCOPY     IR ANGIO INTRA EXTRACRAN SEL COM CAROTID INNOMINATE BILAT MOD SED  12/30/2017   IR ANGIO VERTEBRAL SEL VERTEBRAL BILAT MOD SED  12/30/2017   LAPAROSCOPIC CHOLECYSTECTOMY  08/11/1999   LUMBAR [place] SURGERY  02/19/2014   ORIF ANKLE FRACTURE Left 04/13/2015   Procedure: OPEN REDUCTION INTERNAL FIXATION (ORIF) LEFT ANKLE FRACTURE;  Surgeon: Meredith Pel, MD;  Location: [facility];  Service: Orthopedics;  Laterality: Left;   PACEMAKER PLACEMENT Right 07/22/2010   a. MDT dual chamber PPM implanted by Dr Caryl Comes    SHOULDER ARTHROSCOPY W/ ROTATOR CUFF REPAIR Right 08/30/2014   WITH MINI-OPEN ROTATOR CUFF REPAIR AND SUBACROMIAL DECOMPRESSION   SHOULDER ARTHROSCOPY WITH ROTATOR CUFF REPAIR AND SUBACROMIAL DECOMPRESSION Right 08/30/2014   Procedure: SHOULDER ARTHROSCOPY WITH MINI-OPEN ROTATOR CUFF REPAIR AND SUBACROMIAL DECOMPRESSION;  Surgeon: Meredith Pel, MD;  Location: [facility];  Service: Orthopedics;  Laterality: Right;  RIGHT SHOULDER DIAGNOSTIC OPERATIVE ARTHROSCOPY, SUBACROMIAL DECOMPRESSION, MINI-OPEN ROTATOR CUFF REPAIR.   VAGINAL HYSTERECTOMY  07/22/1968   WRIST FRACTURE SURGERY Left     MEDICATIONS:  acetaminophen (TYLENOL) 500 MG tablet   alendronate (FOSAMAX) 70 MG tablet   apixaban (ELIQUIS) 5 MG TABS tablet   atorvastatin (LIPITOR) 40 MG tablet   B Complex-C (B-COMPLEX WITH VITAMIN C) tablet   Cholecalciferol (VITAMIN D3) 5000 UNITS CAPS   desmopressin (DDAVP) 0.2 MG tablet   diltiazem (CARDIZEM CD) 120 MG 24 hr capsule   dofetilide (TIKOSYN) 500 MCG capsule   lisinopril (PRINIVIL,ZESTRIL) 20 MG tablet   lisinopril (ZESTRIL) 40 MG tablet   Lutein 20 MG TABS   magnesium oxide (MAG-OX) 400 MG tablet   Melatonin 10  MG TABS   metoprolol succinate (TOPROL-XL) 50 MG 24 hr tablet   Multiple Vitamin (MULTIVITAMIN WITH MINERALS) TABS tablet   Multiple Vitamins-Minerals (PRESERVISION AREDS 2 PO)   PRESCRIPTION MEDICATION   sertraline (ZOLOFT) 50 MG tablet   No current facility-administered medications for this encounter.    [START ON 02/13/2021] bupivacaine liposome (EXPAREL) 1.3 % injection 266 mg   [START ON 02/13/2021] tranexamic acid (CYKLOKAPRON) 2,000 mg in sodium chloride 0.9 % 50 mL Topical Application   Konrad Felix, PA-C [facility] Pre-Surgical Testing [phone]

## 2021-02-12 NOTE — Anesthesia Preprocedure Evaluation (Addendum)
Anesthesia Evaluation  Patient identified by MRN, date of birth, ID band Patient awake    Reviewed: Allergy & Precautions, NPO status , Patient's Chart, lab work & pertinent test results  Airway Mallampati: III  TM Distance: >3 FB Neck ROM: Full    Dental  (+) Dental Advisory Given   Pulmonary sleep apnea and Continuous Positive Airway Pressure Ventilation , former smoker,    breath sounds clear to auscultation       Cardiovascular hypertension, Pt. on medications and Pt. on home beta blockers + CAD and + Peripheral Vascular Disease  + dysrhythmias Atrial Fibrillation + pacemaker  Rhythm:Regular Rate:Normal     Neuro/Psych CVA    GI/Hepatic Neg liver ROS, GERD  ,  Endo/Other  Hypothyroidism   Renal/GU negative Renal ROS     Musculoskeletal  (+) Arthritis ,   Abdominal   Peds  Hematology negative hematology ROS (+)   Anesthesia Other Findings   Reproductive/Obstetrics                           Lab Results  Component Value Date   WBC 7.0 02/08/2021   HGB 14.1 02/08/2021   HCT 43.5 02/08/2021   MCV 96.0 02/08/2021   PLT 310 02/08/2021   Lab Results  Component Value Date   CREATININE 0.64 02/08/2021   BUN 14 02/08/2021   NA 138 02/08/2021   K 3.9 02/08/2021   CL 103 02/08/2021   CO2 27 02/08/2021    Anesthesia Physical Anesthesia Plan  ASA: 3  Anesthesia Plan: General   Post-op Pain Management:  Regional for Post-op pain   Induction: Intravenous  PONV Risk Score and Plan: 3 and Dexamethasone, Ondansetron and Treatment may vary due to age or medical condition  Airway Management Planned: LMA  Additional Equipment:   Intra-op Plan:   Post-operative Plan: Extubation in OR  Informed Consent: I have reviewed the patients History and Physical, chart, labs and discussed the procedure including the risks, benefits and alternatives for the proposed anesthesia with the patient  or authorized representative who has indicated his/her understanding and acceptance.     Dental advisory given  Plan Discussed with: CRNA  Anesthesia Plan Comments:       Anesthesia Quick Evaluation

## 2021-02-12 NOTE — H&P (Signed)
TOTAL KNEE ADMISSION H&P  Patient is being admitted for left total knee arthroplasty.  Subjective:  Chief Complaint:left knee pain.  HPI: Brittany [name], 87 y.o. female, has a history of pain and functional disability in the left knee due to arthritis and has failed non-surgical conservative treatments for greater than 12 weeks to includeNSAID's and/or analgesics, corticosteriod injections, viscosupplementation injections, flexibility and strengthening excercises, use of assistive devices, weight reduction as appropriate, and activity modification.  Onset of symptoms was gradual, starting 5 years ago with gradually worsening course since that time. The patient noted no past surgery on the left knee(s).  Patient currently rates pain in the left knee(s) at 10 out of 10 with activity. Patient has night pain, worsening of pain with activity and weight bearing, pain that interferes with activities of daily living, crepitus, and joint swelling.  Patient has evidence of subchondral cysts, subchondral sclerosis, periarticular osteophytes, and joint space narrowing by imaging studies. There is no active infection.  Patient Active Problem List   Diagnosis Date Noted   Obesity (BMI 30.0-34.9) 06/21/2020   Sinus bradycardia 04/12/2020   (HFpEF) heart failure with preserved ejection fraction ([place]) 04/12/2020   Persistent atrial fibrillation ([place]) 03/22/2019   A-fib ([place]) 03/22/2019   Stroke (cerebrum) ([place]) 12/30/2017   Dizziness 06/06/2017   Overactive bladder 06/06/2017   Abnormal mammogram 05/01/2017   Hyperparathyroidism ([place]) 02/12/2017   Vitamin D deficiency 08/14/2016   Osteoporosis 08/14/2016   Hyperlipidemia 08/14/2016   Atherosclerosis of coronary artery 01/02/2016   Hypercalcemia 03/29/2015   Multiple pulmonary nodules 12/30/2013   Lumbar spinal stenosis 12/22/2013   Arthropathy of facet joint 10/08/2013   Atrial fibrillation ([place]) 03/04/2013   Sinus node dysfunction ([place]) 03/04/2013    Pacemaker-Medtronic 03/04/2013   OSA on CPAP 02/23/2013   Anxiety and depression 02/23/2013   Chronic gastritis 02/23/2013   Essential hypertension, benign 02/23/2013   Urinary incontinence 02/23/2013   Past Medical History:  Diagnosis Date   Ankle fracture 2016   Arthritis    Atrial fibrillation (HCC)    Depression    Dysrhythmia    Elevated parathyroid hormone    Fracture of orbital floor with routine healing    GERD (gastroesophageal reflux disease)    History of colon polyps    benign   Hypercalcemia    Hypertension    Hyponatremia    Hypothyroid    Macular degeneration    wet in the right and dry in the left   OSA on CPAP    Osteoporosis    Peripheral vascular disease (HCC)    Presence of permanent cardiac pacemaker    PSVT (paroxysmal supraventricular tachycardia) (HCC)    Rotator cuff tear    Sinus node dysfunction (HCC)    a. s/p MDT pacemaker   Stroke [facility])    Urinary incontinence    Wrist fracture 2018    Past Surgical History:  Procedure Laterality Date   BACK SURGERY     CATARACT EXTRACTION     CATARACT EXTRACTION W/ INTRAOCULAR LENS  IMPLANT, BILATERAL Bilateral    COLONOSCOPY     ESOPHAGOGASTRODUODENOSCOPY     IR ANGIO INTRA EXTRACRAN SEL COM CAROTID INNOMINATE BILAT MOD SED  12/30/2017   IR ANGIO VERTEBRAL SEL VERTEBRAL BILAT MOD SED  12/30/2017   LAPAROSCOPIC CHOLECYSTECTOMY  08/11/1999   LUMBAR [place] SURGERY  02/19/2014   ORIF ANKLE FRACTURE Left 04/13/2015   Procedure: OPEN REDUCTION INTERNAL FIXATION (ORIF) LEFT ANKLE FRACTURE;  Surgeon: Meredith Pel, MD;  Location:  [place] OR;  Service: Orthopedics;  Laterality: Left;   PACEMAKER PLACEMENT Right 07/22/2010   a. MDT dual chamber PPM implanted by Dr Caryl Comes    SHOULDER ARTHROSCOPY W/ ROTATOR CUFF REPAIR Right 08/30/2014   WITH MINI-OPEN ROTATOR CUFF REPAIR AND SUBACROMIAL DECOMPRESSION   SHOULDER ARTHROSCOPY WITH ROTATOR CUFF REPAIR AND SUBACROMIAL DECOMPRESSION Right 08/30/2014   Procedure:  SHOULDER ARTHROSCOPY WITH MINI-OPEN ROTATOR CUFF REPAIR AND SUBACROMIAL DECOMPRESSION;  Surgeon: Meredith Pel, MD;  Location: [facility];  Service: Orthopedics;  Laterality: Right;  RIGHT SHOULDER DIAGNOSTIC OPERATIVE ARTHROSCOPY, SUBACROMIAL DECOMPRESSION, MINI-OPEN ROTATOR CUFF REPAIR.   VAGINAL HYSTERECTOMY  07/22/1968   WRIST FRACTURE SURGERY Left     Current Facility-Administered Medications  Medication Dose Route Frequency Provider Last Rate Last Admin   [START ON 02/13/2021] bupivacaine liposome (EXPAREL) 1.3 % injection 266 mg  20 mL Other On Call to OR Polly Cobia, RPH       [START ON 02/13/2021] tranexamic acid (CYKLOKAPRON) 2,000 mg in sodium chloride 0.9 % 50 mL Topical Application  [id number] mg Topical On Call to OR Polly Cobia, [facility]       Current Outpatient Medications  Medication Sig Dispense Refill Last Dose   acetaminophen (TYLENOL) 500 MG tablet Take 1,000 mg by mouth 2 (two) times daily.      alendronate (FOSAMAX) 70 MG tablet Take 1 tablet (70 mg total) by mouth every 7 (seven) days. Take with a full glass of water on an empty stomach. (Patient taking differently: Take 70 mg by mouth every Wednesday. Take with a full glass of water on an empty stomach.) 4 tablet 11    apixaban (ELIQUIS) 5 MG TABS tablet Take 1 tablet (5 mg total) by mouth 2 (two) times daily. 180 tablet 3    atorvastatin (LIPITOR) 40 MG tablet Take 1 tablet (40 mg total) by mouth daily at 6 PM. 30 tablet 3    B Complex-C (B-COMPLEX WITH VITAMIN C) tablet Take 1 tablet by mouth daily with lunch.       Cholecalciferol (VITAMIN D3) 5000 UNITS CAPS Take 5,000 Units by mouth daily with breakfast.       desmopressin (DDAVP) 0.2 MG tablet Take 0.2 mg by mouth daily as needed (to control bladder on car trip).       diltiazem (CARDIZEM CD) 120 MG 24 hr capsule Take 1 capsule (120 mg total) by mouth in the morning and at bedtime. 180 capsule 2    dofetilide (TIKOSYN) 500 MCG capsule TAKE ONE CAPSULE BY MOUTH TWICE  DAILY (Patient taking differently: Take 500 mcg by mouth 2 (two) times daily.) 60 capsule 11    lisinopril (ZESTRIL) 40 MG tablet Take 40 mg by mouth daily.      Lutein 20 MG TABS Take 20 mg by mouth daily with breakfast.       magnesium oxide (MAG-OX) 400 MG tablet Take 1 tablet (400 mg total) by mouth daily. 30 tablet 0    Melatonin 10 MG TABS Take 20 mg by mouth at bedtime.      metoprolol succinate (TOPROL-XL) 50 MG 24 hr tablet Take 2 tablets by mouth twice daily with or immediately following meals (Patient taking differently: Take 100 mg by mouth 2 (two) times daily.) 360 tablet 2    Multiple Vitamin (MULTIVITAMIN WITH MINERALS) TABS tablet Take 1 tablet by mouth daily with breakfast.       Multiple Vitamins-Minerals (PRESERVISION AREDS 2 PO) Take 1 capsule by mouth 2 (two) times daily with  a meal.       sertraline (ZOLOFT) 50 MG tablet Take 1 tablet (50 mg total) by mouth daily. 30 tablet 2    lisinopril (PRINIVIL,ZESTRIL) 20 MG tablet Take 1 tablet (20 mg total) by mouth daily. (Patient not taking: Reported on 02/05/2021) 90 tablet 1 Not Taking   PRESCRIPTION MEDICATION Inhale into the lungs at bedtime. CPAP      Allergies  Allergen Reactions   Adhesive [Tape] Swelling and Rash   Benzalkonium Chloride Rash    Pt was not aware of this allergy   Neosporin [Neomycin-Polymyxin-Gramicidin] Swelling and Rash    Social History   Tobacco Use   Smoking status: Former    Packs/day: 1.00    Years: 45.00    Pack years: 45.00    Types: Cigarettes    Quit date: 1992    Years since quitting: 30.5   Smokeless tobacco: Never   Tobacco comments:    quit smoking in 1992  Substance Use Topics   Alcohol use: Yes    Alcohol/week: 28.0 standard drinks    Types: 28 Glasses of wine per week    Comment: "16oz wine q night" every night    Family History  Problem Relation Age of Onset   Colon cancer Mother        colon   Cancer Father        unknown type   Stroke Sister    Skin cancer Brother     Heart disease Brother    Breast cancer Paternal Aunt    Asthma Brother      Review of Systems  Musculoskeletal:  Positive for arthralgias.       Left knee pain  All other systems reviewed and are negative.  Objective:  Physical Exam Constitutional:      Appearance: Normal appearance.  HENT:     Head: Normocephalic and atraumatic.     Nose: Nose normal.     Mouth/Throat:     Pharynx: Oropharynx is clear.  Eyes:     Extraocular Movements: Extraocular movements intact.  Cardiovascular:     Rate and Rhythm: Normal rate.  Pulmonary:     Effort: Pulmonary effort is normal.  Abdominal:     Palpations: Abdomen is soft.  Musculoskeletal:     Cervical back: Normal range of motion.  Skin:    General: Skin is warm and dry.  Neurological:     General: No focal deficit present.     Mental Status: She is alert and oriented to person, place, and time. Mental status is at baseline.  Psychiatric:        Mood and Affect: Mood normal.        Behavior: Behavior normal.        Thought Content: Thought content normal.        Judgment: Judgment normal.    Vital signs in last 24 hours:    Labs:   Estimated body mass index is 32.48 kg/m as calculated from the following:   Height as of 02/08/21: '5\' 8"'$  (1.727 m).   Weight as of 01/05/21: 96.9 kg.   Imaging Review Plain radiographs demonstrate severe degenerative joint disease of the left knee(s). The overall alignment isneutral. The bone quality appears to be good for age and reported activity level.      Assessment/Plan:  End stage primary arthritis, left knee   The patient history, physical examination, clinical judgment of the provider and imaging studies are consistent with end stage degenerative joint disease  of the left knee(s) and total knee arthroplasty is deemed medically necessary. The treatment options including medical management, injection therapy arthroscopy and arthroplasty were discussed at length. The risks and  benefits of total knee arthroplasty were presented and reviewed. The risks due to aseptic loosening, infection, stiffness, patella tracking problems, thromboembolic complications and other imponderables were discussed. The patient acknowledged the explanation, agreed to proceed with the plan and consent was signed. Patient is being admitted for inpatient treatment for surgery, pain control, PT, OT, prophylactic antibiotics, VTE prophylaxis, progressive ambulation and ADL's and discharge planning. The patient is planning to be discharged home with home health services   Patient's anticipated LOS is less than 2 midnights, meeting these requirements: - Younger than 94 - Lives within 1 hour of care - Has a competent adult at home to recover with post-op recover - NO history of  - Chronic pain requiring opiods  - Diabetes  - Coronary Artery Disease  - Heart failure  - Heart attack  - Stroke  - DVT/VTE  - Cardiac arrhythmia  - Respiratory Failure/COPD  - Renal failure  - Anemia  - Advanced Liver disease

## 2021-02-13 ENCOUNTER — Ambulatory Visit (HOSPITAL_COMMUNITY): Payer: Medicare Other | Admitting: Anesthesiology

## 2021-02-13 ENCOUNTER — Encounter (HOSPITAL_COMMUNITY)
Admission: RE | Disposition: A | Discharge status: Home / Self Care | Payer: Self-pay | Source: Home / Self Care | Attending: Orthopaedic Surgery

## 2021-02-13 ENCOUNTER — Ambulatory Visit (HOSPITAL_COMMUNITY)
Admission: RE | Admit: 2021-02-13 | Discharge: 2021-02-13 | Disposition: A | Discharge status: Home / Self Care | Payer: Medicare Other | Attending: Orthopaedic Surgery | Admitting: Orthopaedic Surgery

## 2021-02-13 ENCOUNTER — Ambulatory Visit (HOSPITAL_COMMUNITY): Payer: Medicare Other | Admitting: Physician Assistant

## 2021-02-13 DIAGNOSIS — Z87891 Personal history of nicotine dependence: Secondary | ICD-10-CM | POA: Insufficient documentation

## 2021-02-13 DIAGNOSIS — Z823 Family history of stroke: Secondary | ICD-10-CM | POA: Insufficient documentation

## 2021-02-13 DIAGNOSIS — Z91048 Other nonmedicinal substance allergy status: Secondary | ICD-10-CM | POA: Insufficient documentation

## 2021-02-13 DIAGNOSIS — I110 Hypertensive heart disease with heart failure: Secondary | ICD-10-CM | POA: Diagnosis not present

## 2021-02-13 DIAGNOSIS — Z8249 Family history of ischemic heart disease and other diseases of the circulatory system: Secondary | ICD-10-CM | POA: Diagnosis not present

## 2021-02-13 DIAGNOSIS — Z8673 Personal history of transient ischemic attack (TIA), and cerebral infarction without residual deficits: Secondary | ICD-10-CM | POA: Diagnosis not present

## 2021-02-13 DIAGNOSIS — M25562 Pain in left knee: Secondary | ICD-10-CM | POA: Diagnosis not present

## 2021-02-13 DIAGNOSIS — E039 Hypothyroidism, unspecified: Secondary | ICD-10-CM | POA: Diagnosis not present

## 2021-02-13 DIAGNOSIS — E669 Obesity, unspecified: Secondary | ICD-10-CM | POA: Diagnosis not present

## 2021-02-13 DIAGNOSIS — Z803 Family history of malignant neoplasm of breast: Secondary | ICD-10-CM | POA: Insufficient documentation

## 2021-02-13 DIAGNOSIS — Z7901 Long term (current) use of anticoagulants: Secondary | ICD-10-CM | POA: Insufficient documentation

## 2021-02-13 DIAGNOSIS — Z825 Family history of asthma and other chronic lower respiratory diseases: Secondary | ICD-10-CM | POA: Insufficient documentation

## 2021-02-13 DIAGNOSIS — G8918 Other acute postprocedural pain: Secondary | ICD-10-CM | POA: Diagnosis not present

## 2021-02-13 DIAGNOSIS — I5032 Chronic diastolic (congestive) heart failure: Secondary | ICD-10-CM | POA: Insufficient documentation

## 2021-02-13 DIAGNOSIS — M1712 Unilateral primary osteoarthritis, left knee: Secondary | ICD-10-CM | POA: Diagnosis not present

## 2021-02-13 DIAGNOSIS — R262 Difficulty in walking, not elsewhere classified: Secondary | ICD-10-CM | POA: Insufficient documentation

## 2021-02-13 DIAGNOSIS — Z79899 Other long term (current) drug therapy: Secondary | ICD-10-CM | POA: Insufficient documentation

## 2021-02-13 DIAGNOSIS — Z6830 Body mass index (BMI) 30.0-30.9, adult: Secondary | ICD-10-CM | POA: Diagnosis not present

## 2021-02-13 DIAGNOSIS — M6281 Muscle weakness (generalized): Secondary | ICD-10-CM | POA: Insufficient documentation

## 2021-02-13 DIAGNOSIS — I4819 Other persistent atrial fibrillation: Secondary | ICD-10-CM | POA: Diagnosis not present

## 2021-02-13 DIAGNOSIS — Z888 Allergy status to other drugs, medicaments and biological substances status: Secondary | ICD-10-CM | POA: Diagnosis not present

## 2021-02-13 DIAGNOSIS — Z881 Allergy status to other antibiotic agents status: Secondary | ICD-10-CM | POA: Diagnosis not present

## 2021-02-13 DIAGNOSIS — E785 Hyperlipidemia, unspecified: Secondary | ICD-10-CM | POA: Diagnosis not present

## 2021-02-13 HISTORY — PX: TOTAL KNEE ARTHROPLASTY: SHX125

## 2021-02-13 LAB — ABO/RH: ABO/RH(D): A POS

## 2021-02-13 LAB — TYPE AND SCREEN
ABO/RH(D): A POS
Antibody Screen: NEGATIVE

## 2021-02-13 SURGERY — ARTHROPLASTY, KNEE, TOTAL
Anesthesia: General | Site: Knee | Laterality: Left

## 2021-02-13 MED ORDER — METHOCARBAMOL 500 MG IVPB - SIMPLE MED
INTRAVENOUS | Status: AC
  Filled 2021-02-13: qty 50

## 2021-02-13 MED ORDER — SODIUM CHLORIDE 0.9 % IV SOLN: 2.0000 g | Freq: Four times a day (QID) | INTRAVENOUS | Status: DC

## 2021-02-13 MED ORDER — LIDOCAINE HCL (CARDIAC) PF 100 MG/5ML IV SOSY
PREFILLED_SYRINGE | INTRAVENOUS | Status: DC | PRN
  Administered 2021-02-13: 40 mg via INTRATRACHEAL

## 2021-02-13 MED ORDER — TRANEXAMIC ACID-NACL 1000-0.7 MG/100ML-% IV SOLN
1000.0000 mg | Freq: Once | INTRAVENOUS | Status: AC
Start: 2021-02-13 — End: 2021-02-13

## 2021-02-13 MED ORDER — PROPOFOL 1000 MG/100ML IV EMUL
INTRAVENOUS | Status: AC
  Filled 2021-02-13: qty 100

## 2021-02-13 MED ORDER — BUPIVACAINE-EPINEPHRINE 0.5% -1:200000 IJ SOLN
INTRAMUSCULAR | Status: DC | PRN
  Administered 2021-02-13: 30 mL

## 2021-02-13 MED ORDER — TRANEXAMIC ACID-NACL 1000-0.7 MG/100ML-% IV SOLN
1000.0000 mg | INTRAVENOUS | Status: AC
  Administered 2021-02-13: 1000 mg via INTRAVENOUS
  Filled 2021-02-13: qty 100

## 2021-02-13 MED ORDER — LACTATED RINGERS IV BOLUS
250.0000 mL | Freq: Once | INTRAVENOUS | Status: AC
  Administered 2021-02-13: 250 mL via INTRAVENOUS

## 2021-02-13 MED ORDER — ONDANSETRON HCL 4 MG/2ML IJ SOLN
INTRAMUSCULAR | Status: DC | PRN
  Administered 2021-02-13: 4 mg via INTRAVENOUS

## 2021-02-13 MED ORDER — HYDROCODONE-ACETAMINOPHEN 5-325 MG PO TABS: 1.0000 | ORAL_TABLET | ORAL | Status: DC | PRN

## 2021-02-13 MED ORDER — TRANEXAMIC ACID 1000 MG/10ML IV SOLN
INTRAVENOUS | Status: DC | PRN
  Administered 2021-02-13: 2000 mg via TOPICAL

## 2021-02-13 MED ORDER — METHOCARBAMOL 500 MG IVPB - SIMPLE MED
500.0000 mg | Freq: Four times a day (QID) | INTRAVENOUS | Status: DC | PRN
  Administered 2021-02-13: 500 mg via INTRAVENOUS

## 2021-02-13 MED ORDER — METHOCARBAMOL 500 MG PO TABS: 500.0000 mg | ORAL_TABLET | Freq: Four times a day (QID) | ORAL | Status: DC | PRN

## 2021-02-13 MED ORDER — HYDROCODONE-ACETAMINOPHEN 7.5-325 MG PO TABS
1.0000 | ORAL_TABLET | ORAL | Status: DC | PRN
  Administered 2021-02-13: 1 via ORAL

## 2021-02-13 MED ORDER — LACTATED RINGERS IV SOLN: INTRAVENOUS | Status: DC

## 2021-02-13 MED ORDER — FENTANYL CITRATE (PF) 100 MCG/2ML IJ SOLN
INTRAMUSCULAR | Status: AC
  Filled 2021-02-13: qty 2

## 2021-02-13 MED ORDER — TIZANIDINE HCL 2 MG PO TABS: 2.0000 mg | ORAL_TABLET | Freq: Four times a day (QID) | ORAL | 1 refills | Status: DC | PRN

## 2021-02-13 MED ORDER — ROPIVACAINE HCL 5 MG/ML IJ SOLN
INTRAMUSCULAR | Status: DC | PRN
  Administered 2021-02-13: 20 mL via PERINEURAL

## 2021-02-13 MED ORDER — POVIDONE-IODINE 10 % EX SWAB
2.0000 "application " | Freq: Once | CUTANEOUS | Status: AC
  Administered 2021-02-13: 2 via TOPICAL

## 2021-02-13 MED ORDER — KETOROLAC TROMETHAMINE 30 MG/ML IJ SOLN
INTRAMUSCULAR | Status: AC
  Administered 2021-02-13: 30 mg
  Filled 2021-02-13: qty 1

## 2021-02-13 MED ORDER — DEXAMETHASONE SODIUM PHOSPHATE 10 MG/ML IJ SOLN
INTRAMUSCULAR | Status: DC | PRN
  Administered 2021-02-13: 10 mg via INTRAVENOUS

## 2021-02-13 MED ORDER — APIXABAN 5 MG PO TABS: 5.0000 mg | ORAL_TABLET | Freq: Two times a day (BID) | ORAL | Status: DC

## 2021-02-13 MED ORDER — BUPIVACAINE LIPOSOME 1.3 % IJ SUSP
INTRAMUSCULAR | Status: DC | PRN
  Administered 2021-02-13: 20 mL

## 2021-02-13 MED ORDER — HYDROCODONE-ACETAMINOPHEN 7.5-325 MG PO TABS
ORAL_TABLET | ORAL | Status: AC
  Filled 2021-02-13: qty 1

## 2021-02-13 MED ORDER — SODIUM CHLORIDE (PF) 0.9 % IJ SOLN
INTRAMUSCULAR | Status: DC | PRN
  Administered 2021-02-13: 30 mL

## 2021-02-13 MED ORDER — PROPOFOL 500 MG/50ML IV EMUL
INTRAVENOUS | Status: DC | PRN
Start: 2021-02-13 — End: 2021-02-13
  Administered 2021-02-13: 150 mg via INTRAVENOUS
  Administered 2021-02-13: 50 mg via INTRAVENOUS

## 2021-02-13 MED ORDER — HYDROCODONE-ACETAMINOPHEN 5-325 MG PO TABS: 1.0000 | ORAL_TABLET | Freq: Four times a day (QID) | ORAL | 0 refills | Status: DC | PRN

## 2021-02-13 MED ORDER — BUPIVACAINE-EPINEPHRINE 0.5% -1:200000 IJ SOLN
INTRAMUSCULAR | Status: AC
  Filled 2021-02-13: qty 1

## 2021-02-13 MED ORDER — AMISULPRIDE (ANTIEMETIC) 5 MG/2ML IV SOLN: 10.0000 mg | Freq: Once | INTRAVENOUS | Status: DC | PRN

## 2021-02-13 MED ORDER — STERILE WATER FOR IRRIGATION IR SOLN
Status: DC | PRN
  Administered 2021-02-13 (×2): 1000 mL

## 2021-02-13 MED ORDER — SODIUM CHLORIDE 0.9 % IV SOLN
2.0000 g | INTRAVENOUS | Status: AC
  Administered 2021-02-13: 2 g via INTRAVENOUS
  Filled 2021-02-13: qty 2

## 2021-02-13 MED ORDER — LACTATED RINGERS IV BOLUS
500.0000 mL | Freq: Once | INTRAVENOUS | Status: AC
  Administered 2021-02-13: 500 mL via INTRAVENOUS

## 2021-02-13 MED ORDER — 0.9 % SODIUM CHLORIDE (POUR BTL) OPTIME
TOPICAL | Status: DC | PRN
  Administered 2021-02-13: 1000 mL

## 2021-02-13 MED ORDER — KETOROLAC TROMETHAMINE 15 MG/ML IJ SOLN
7.5000 mg | Freq: Four times a day (QID) | INTRAMUSCULAR | Status: DC
  Administered 2021-02-13: 7.5 mg via INTRAVENOUS

## 2021-02-13 MED ORDER — FENTANYL CITRATE (PF) 100 MCG/2ML IJ SOLN: 25.0000 ug | INTRAMUSCULAR | Status: DC | PRN

## 2021-02-13 MED ORDER — ORAL CARE MOUTH RINSE
15.0000 mL | Freq: Once | OROMUCOSAL | Status: AC
  Administered 2021-02-13: 15 mL via OROMUCOSAL

## 2021-02-13 MED ORDER — CHLORHEXIDINE GLUCONATE 0.12 % MT SOLN: 15.0000 mL | Freq: Once | OROMUCOSAL | Status: AC

## 2021-02-13 MED ORDER — TRANEXAMIC ACID-NACL 1000-0.7 MG/100ML-% IV SOLN
INTRAVENOUS | Status: AC
  Administered 2021-02-13: 1000 mg via INTRAVENOUS
  Filled 2021-02-13: qty 100

## 2021-02-13 MED ORDER — PHENYLEPHRINE HCL (PRESSORS) 10 MG/ML IV SOLN
INTRAVENOUS | Status: AC
  Filled 2021-02-13: qty 1

## 2021-02-13 MED ORDER — FENTANYL CITRATE (PF) 100 MCG/2ML IJ SOLN
INTRAMUSCULAR | Status: DC | PRN
  Administered 2021-02-13: 25 ug via INTRAVENOUS
  Administered 2021-02-13: 50 ug via INTRAVENOUS
  Administered 2021-02-13 (×2): 25 ug via INTRAVENOUS

## 2021-02-13 MED ORDER — SODIUM CHLORIDE 0.9 % IV SOLN
INTRAVENOUS | Status: AC
  Administered 2021-02-13: 2 g via INTRAVENOUS
  Filled 2021-02-13: qty 2

## 2021-02-13 SURGICAL SUPPLY — 52 items
ATTUNE MED DOME PAT 38 KNEE (Knees) ×2 IMPLANT
ATTUNE PS FEM LT SZ 4 CEM KNEE (Femur) ×2 IMPLANT
ATTUNE PSRP INSR SZ4 7 KNEE (Insert) ×2 IMPLANT
BAG COUNTER SPONGE SURGICOUNT (BAG) ×2 IMPLANT
BAG DECANTER FOR FLEXI CONT (MISCELLANEOUS) ×2 IMPLANT
BAG ZIPLOCK 12X15 (MISCELLANEOUS) ×2 IMPLANT
BASE TIBIA ATTUNE KNEE SYS SZ6 (Knees) ×1 IMPLANT
BLADE SAGITTAL 25.0X1.19X90 (BLADE) ×2 IMPLANT
BLADE SAW SGTL 11.0X1.19X90.0M (BLADE) ×2 IMPLANT
BLADE SURG SZ10 CARB STEEL (BLADE) ×2 IMPLANT
BNDG ELASTIC 6X5.8 VLCR STR LF (GAUZE/BANDAGES/DRESSINGS) ×2 IMPLANT
BOOTIES KNEE HIGH SLOAN (MISCELLANEOUS) ×2 IMPLANT
BOWL SMART MIX CTS (DISPOSABLE) ×2 IMPLANT
CEMENT HV SMART SET (Cement) ×4 IMPLANT
COVER SURGICAL LIGHT HANDLE (MISCELLANEOUS) ×2 IMPLANT
CUFF TOURN SGL QUICK 34 (TOURNIQUET CUFF) ×1
CUFF TRNQT CYL 34X4.125X (TOURNIQUET CUFF) ×1 IMPLANT
DECANTER SPIKE VIAL GLASS SM (MISCELLANEOUS) ×4 IMPLANT
DRAPE SHEET LG 3/4 BI-LAMINATE (DRAPES) ×2 IMPLANT
DRAPE U-SHAPE 47X51 STRL (DRAPES) ×2 IMPLANT
DRSG AQUACEL AG ADV 3.5X10 (GAUZE/BANDAGES/DRESSINGS) ×2 IMPLANT
DURAPREP 26ML APPLICATOR (WOUND CARE) ×4 IMPLANT
ELECT REM PT RETURN 15FT ADLT (MISCELLANEOUS) ×2 IMPLANT
GLOVE SRG 8 PF TXTR STRL LF DI (GLOVE) ×2 IMPLANT
GLOVE SURG ENC MOIS LTX SZ8 (GLOVE) ×4 IMPLANT
GLOVE SURG UNDER POLY LF SZ8 (GLOVE) ×2
GOWN STRL REUS W/TWL XL LVL3 (GOWN DISPOSABLE) ×4 IMPLANT
HANDPIECE INTERPULSE COAX TIP (DISPOSABLE) ×1
HOLDER FOLEY CATH W/STRAP (MISCELLANEOUS) IMPLANT
HOOD PEEL AWAY FLYTE STAYCOOL (MISCELLANEOUS) ×6 IMPLANT
KIT TURNOVER KIT A (KITS) ×2 IMPLANT
MANIFOLD NEPTUNE II (INSTRUMENTS) ×2 IMPLANT
NEEDLE HYPO 22GX1.5 SAFETY (NEEDLE) ×2 IMPLANT
NS IRRIG 1000ML POUR BTL (IV SOLUTION) ×2 IMPLANT
PACK TOTAL KNEE CUSTOM (KITS) ×2 IMPLANT
PAD ARMBOARD 7.5X6 YLW CONV (MISCELLANEOUS) ×2 IMPLANT
PENCIL SMOKE EVACUATOR (MISCELLANEOUS) IMPLANT
PIN DRILL FIX HALF THREAD (BIT) ×2 IMPLANT
PIN STEINMAN FIXATION KNEE (PIN) ×2 IMPLANT
PROTECTOR NERVE ULNAR (MISCELLANEOUS) ×2 IMPLANT
SET HNDPC FAN SPRY TIP SCT (DISPOSABLE) ×1 IMPLANT
SUT ETHIBOND NAB CT1 #1 30IN (SUTURE) ×4 IMPLANT
SUT VIC AB 0 CT1 36 (SUTURE) ×2 IMPLANT
SUT VIC AB 2-0 CT1 27 (SUTURE) ×1
SUT VIC AB 2-0 CT1 TAPERPNT 27 (SUTURE) ×1 IMPLANT
SUT VICRYL AB 3-0 FS1 BRD 27IN (SUTURE) ×2 IMPLANT
SUT VLOC 180 0 24IN GS25 (SUTURE) ×2 IMPLANT
TIBIA ATTUNE KNEE SYS BASE SZ6 (Knees) ×2 IMPLANT
TRAY FOLEY MTR SLVR 16FR STAT (SET/KITS/TRAYS/PACK) IMPLANT
WATER STERILE IRR 1000ML POUR (IV SOLUTION) ×2 IMPLANT
WRAP KNEE MAXI GEL POST OP (GAUZE/BANDAGES/DRESSINGS) ×2 IMPLANT
YANKAUER SUCT BULB TIP NO VENT (SUCTIONS) ×2 IMPLANT

## 2021-02-13 NOTE — Op Note (Signed)
PREOP DIAGNOSIS: DJD LEFT KNEE POSTOP DIAGNOSIS:  same PROCEDURE: LEFT TKR ANESTHESIA: block and general ATTENDING SURGEON: Hessie Dibble ASSISTANT: Brittany Dolly PA  INDICATIONS FOR PROCEDURE: Brittany [name] is a 87 y.o. female who has struggled for a long time with pain due to degenerative arthritis of the left knee.  The patient has failed many conservative non-operative measures and at this point has pain which limits the ability to sleep and walk.  The patient is offered total knee replacement.  Informed operative consent was obtained after discussion of possible risks of anesthesia, infection, neurovascular injury, DVT, and death.  The importance of the post-operative rehabilitation protocol to optimize result was stressed extensively with the patient.  SUMMARY OF FINDINGS AND PROCEDURE:  Brittany [name] was taken to the operative suite where under the above anesthesia a left knee replacement was performed.  There were advanced degenerative changes and the bone quality was poor.  We used the DePuyAttune system and placed size 4 femur, 6 tibia, 38 mm all polyethylene patella, and a size 7 mm spacer.  Brittany Dolly PA-C assisted throughout and was invaluable to the completion of the case in that he helped retract and maintain exposure while I placed the components.  He also helped close thereby minimizing OR time.  The patient was admitted for appropriate post-op care to include perioperative antibiotics and mechanical and pharmacologic measures for DVT prophylaxis.  DESCRIPTION OF PROCEDURE:  Brittany [name] was taken to the operative suite where the above anesthesia was applied.  The patient was positioned supine and prepped and draped in normal sterile fashion.  An appropriate time out was performed.  After the administration of kefzol pre-op antibiotic the leg was elevated and exsanguinated and a tourniquet inflated.  A standard longitudinal incision was made on the anterior knee.  Dissection was  carried down to the extensor mechanism.  All appropriate anti-infective measures were used including the pre-operative antibiotic, betadine impregnated drape, and closed hooded exhaust systems for each member of the surgical team.  A medial parapatellar incision was made in the extensor mechanism and the knee cap flipped and the knee flexed.  Some residual meniscal tissues were removed along with any remaining ACL/PCL tissue.  A guide was placed on the tibia and a flat cut was made on it's superior surface.  An intramedullary guide was placed in the femur and was utilized to make anterior and posterior cuts creating an appropriate flexion gap.  A second intramedullary guide was placed in the femur to make a distal cut properly balancing the knee with an extension gap equal to the flexion gap.  The three bones sized to the above mentioned sizes and the appropriate guides were placed and utilized.  A trial reduction was done and the knee easily came to full extension and the patella tracked well on flexion.  The trial components were removed and all bones were cleaned with pulsatile lavage and then dried thoroughly.  Cement was mixed and was pressurized onto the bones followed by placement of the aforementioned components.  Excess cement was trimmed and pressure was held on the components until the cement had hardened.  The tourniquet was deflated and a small amount of bleeding was controlled with cautery and pressure.  The knee was irrigated thoroughly.  The extensor mechanism was re-approximated with #1 ethibond in interrupted fashion.  The knee was flexed and the repair was solid.  The subcutaneous tissues were re-approximated with #0 and #2-0 vicryl and the skin  closed with a subcuticular stitch and steristrips.  A sterile dressing was applied.  Intraoperative fluids, EBL, and tourniquet time can be obtained from anesthesia records.  DISPOSITION:  The patient was taken to recovery room in stable condition and  admitted for appropriate post-op care to include peri-operative antibiotic and DVT prophylaxis with mechanical and pharmacologic measures.  Monico Blitz Issaih Brittany [name] 02/13/2021, 9:10 AM

## 2021-02-13 NOTE — Anesthesia Procedure Notes (Signed)
Procedure Name: LMA Insertion Date/Time: 02/13/2021 7:37 AM Performed by: Jari Pigg, CRNA Pre-anesthesia Checklist: Emergency Drugs available, Patient identified, Suction available and Patient being monitored Patient Re-evaluated:Patient Re-evaluated prior to induction Oxygen Delivery Method: Circle system utilized Preoxygenation: Pre-oxygenation with 100% oxygen Induction Type: IV induction Ventilation: Mask ventilation without difficulty LMA: LMA inserted LMA Size: 4.0 Number of attempts: 1 Dental Injury: Teeth and Oropharynx as per pre-operative assessment

## 2021-02-13 NOTE — Anesthesia Procedure Notes (Signed)
Anesthesia Regional Block: Adductor canal block   Pre-Anesthetic Checklist: , timeout performed,  Correct Patient, Correct Site, Correct Laterality,  Correct Procedure, Correct Position, site marked,  Risks and benefits discussed,  Surgical consent,  Pre-op evaluation,  At surgeon's request and post-op pain management  Laterality: Left  Prep: chloraprep       Needles:  Injection technique: Single-shot  Needle Type: Echogenic Needle     Needle Length: 9cm  Needle Gauge: 21     Additional Needles:   Procedures:,,,, ultrasound used (permanent image in chart),,    Narrative:  Start time: 02/13/2021 7:02 AM End time: 02/13/2021 7:08 AM Injection made incrementally with aspirations every 5 mL.  Performed by: Personally  Anesthesiologist: Suzette Battiest, MD

## 2021-02-13 NOTE — Interval H&P Note (Signed)
History and Physical Interval Note:  02/13/2021 9:12 AM  Brittany [name]  has presented today for surgery, with the diagnosis of LEFT KNEE DEGENERATIVE JOINT DISEASE.  The various methods of treatment have been discussed with the patient and family. After consideration of risks, benefits and other options for treatment, the patient has consented to  Procedure(s): LEFT TOTAL KNEE ARTHROPLASTY (Left) as a surgical intervention.  The patient's history has been reviewed, patient examined, no change in status, stable for surgery.  I have reviewed the patient's chart and labs.  Questions were answered to the patient's satisfaction.     Hessie Dibble

## 2021-02-13 NOTE — Evaluation (Signed)
Physical Therapy Evaluation Patient Details Name: Brittany [name] MRN: [id number] DOB: [birth year] Today's Date: 02/13/2021   History of Present Illness   Patient is and 87 y.o. female s/p Lt TKA on 02/13/21 with PMH significant for OA, Afib, HTN, GERD, osteoporosis, PSVT, sinus node dysfunction, permanent pacemaker present, Stroke, back surgery, Lt ankle fracture s/p ORIF in 2016, Rt RCR in 2016.    Clinical Impression  Brittany [name] is a 87 y.o. female POD 0 s/p Lt TKA. Patient reports independence with mobility at baseline. Patient is now limited by functional impairments (see PT problem list below) and requires min guard for transfers and gait with RW. Patient was able to ambulate ~80 feet with RW and min guard and cues for safe walker management. Patient instructed in exercises to facilitate ROM and circulation. Patient will benefit from continued skilled PT interventions to address impairments and progress towards PLOF. Patient has met mobility goals at adequate level for discharge home; will continue to follow if pt continues acute stay to progress towards Mod I goals.        02/13/21 1300  PT Visit Information  Last PT Received On 02/13/21  Assistance Needed +1  History of Present Illness Patient is and 87 y.o. female s/p Lt TKA on 02/13/21 with PMH significant for OA, Afib, HTN, GERD, osteoporosis, PSVT, sinus node dysfunction, permanent pacemaker present, Stroke, back surgery, Lt ankle fracture s/p ORIF in 2016, Rt RCR in 2016.  Precautions  Precautions Fall  Precaution Comments 2 falls in last 6 months, 1st time woozy while walking and 3nd time got up from sitting and fell.  Restrictions  Weight Bearing Restrictions No  Other Position/Activity Restrictions WBAT  Home Living  Family/patient expects to be discharged to: Private residence  Living Arrangements Spouse/significant other  Available Help at Discharge Family  Type of [place] Access Level entry  Home  Layout One level  Bathroom Shower/Tub Walk-in Cytogeneticist Yes  Home Equipment [place] - 2 wheels;Shower seat;Grab bars - tub/shower;Grab bars - toilet;Hand held shower head;BSC;Cane - single point  Additional Comments [place]  Prior Function  Level of Independence Independent with assistive device(s);Needs assistance  Gait / Transfers Assistance Needed uses husband arm out of apartment and uses RW in apartment and if going down to [place]  ADL's / [place] Needed bathes and dresses  Communication  Communication No difficulties  Pain Assessment  Pain Assessment 0-10  Pain Score 5  Pain Location Lt knee  Pain Descriptors / Indicators Aching;Discomfort  Pain Intervention(s) Limited activity within patient's tolerance;Monitored during session;Ice applied  Cognition  Arousal/Alertness Awake/alert  Behavior During Therapy WFL for tasks assessed/performed  Overall Cognitive Status Within Functional Limits for tasks assessed  Upper Extremity Assessment  Upper Extremity Assessment Overall WFL for tasks assessed  Lower Extremity Assessment  Lower Extremity Assessment LLE deficits/detail  LLE Deficits / Details good quad activaiton, extensor  LLE Sensation WNL  LLE Coordination WNL  Cervical / Trunk Assessment  Cervical / Trunk Assessment Normal  Bed Mobility  Overal bed mobility Needs Assistance  Bed Mobility Supine to Sit  Supine to sit Min guard;HOB elevated  General bed mobility comments close guarding for safety, pt required significantly extra time.  Transfers  Overall transfer level Needs assistance  Equipment used Rolling walker (2 wheeled)  Transfers Sit to/from Stand  Sit to Stand Min guard  General transfer comment cues for hand placement close guard for power up  from EOB during hand transition to RW. pt stood from EOB, BSC, and recliner, all surfaces pt required extra effort and time to rise.   Ambulation/Gait  Ambulation/Gait assistance Min assist;Min guard  Gait Distance (Feet) 80 Feet  Assistive device Rolling walker (2 wheeled)  Gait Pattern/deviations Step-to pattern;Decreased stride length;Shuffle;Decreased stance time - left;Decreased weight shift to left  General Gait Details pt with slow short step pattern and required cues for safe proximity and step to pattern with RW. Assist required for positioning of walker initially and pt progressed to min gaurd assist.  Gait velocity decr  Balance  Overall balance assessment History of Falls;Needs assistance  Sitting-balance support Feet supported  Sitting balance-Leahy Scale Fair  Standing balance support During functional activity;Bilateral upper extremity supported  Standing balance-Leahy Scale Poor  Exercises  Exercises Total Joint  Total Joint Exercises  Ankle Circles/Pumps AROM;Both;20 reps;Seated  Quad Sets AROM;Left;Seated;5 reps  Short Arc Quad AROM;Left;Seated;5 reps  Heel Slides AROM;Left;Seated;5 reps  Towel Squeeze AROM;Left;Seated;5 reps  Hip ABduction/ADduction AROM;Left;Seated;5 reps  PT - End of Session  Equipment Utilized During Treatment Gait belt  Activity Tolerance Patient tolerated treatment well  Patient left in chair;with call bell/phone within reach  PT Assessment  PT Recommendation/Assessment Patient needs continued PT services  PT Visit Diagnosis Muscle weakness (generalized) (M62.81);Difficulty in walking, not elsewhere classified (R26.2)  PT Problem List Decreased strength;Decreased range of motion;Decreased activity tolerance;Decreased mobility;Decreased balance;Decreased knowledge of use of DME;Decreased knowledge of precautions;Pain  Barriers to Discharge Decreased caregiver support  PT Plan  PT Frequency (ACUTE ONLY) 7X/week  PT Treatment/Interventions (ACUTE ONLY) DME instruction;Gait training;Stair training;Functional mobility training;Therapeutic activities;Therapeutic exercise;Balance  training;Patient/family education  AM-PAC PT "6 Clicks" Mobility Outcome Measure (Version 2)  Help needed turning from your back to your side while in a flat bed without using bedrails? 3  Help needed moving from lying on your back to sitting on the side of a flat bed without using bedrails? 3  Help needed moving to and from a bed to a chair (including a wheelchair)? 3  Help needed standing up from a chair using your arms (e.g., wheelchair or bedside chair)? 3  Help needed to walk in hospital room? 3  Help needed climbing 3-5 steps with a railing?  3  6 Click Score 18  Consider Recommendation of Discharge To: Home with [facility]  Progressive Mobility  What is the highest level of mobility based on the progressive mobility assessment? Level 5 (Walks with assist in room/hall) - Balance while stepping forward/back and can walk in room with assist - Complete  Mobility Ambulated with assistance in hallway  PT Recommendation  Follow Up Recommendations Follow surgeon's recommendation for DC plan and follow-up therapies;Home health PT  PT equipment None recommended by PT  Individuals Consulted  Consulted and Agree with Results and Recommendations Patient  Acute Rehab PT Goals  Patient Stated Goal go home today  PT Goal Formulation With patient  Time For Goal Achievement 02/20/21  Potential to Achieve Goals Good  PT Time Calculation  PT Start Time (ACUTE ONLY) 1335  PT Stop Time (ACUTE ONLY) 1447  PT Time Calculation (min) (ACUTE ONLY) 72 min  PT General Charges  $$ ACUTE PT VISIT 1 Visit  PT Evaluation  $PT Eval Low Complexity 1 Low  PT Treatments  $Gait Training 23-37 mins  $Therapeutic Exercise 8-22 mins  $Therapeutic Activity 8-22 mins  Written Expression  Dominant Hand Right    Verner Mould, DPT Acute Rehabilitation Services Office [phone] Pager  [phone]   Jacques Navy 02/13/2021, 5:10 PM

## 2021-02-13 NOTE — Transfer of Care (Signed)
Immediate Anesthesia Transfer of Care Note  Patient: Brittany [name]  Procedure(s) Performed: LEFT TOTAL KNEE ARTHROPLASTY (Left: Knee)  Patient Location: PACU  Anesthesia Type:General and Regional  Level of Consciousness: awake and patient cooperative  Airway & Oxygen Therapy: Patient Spontanous Breathing and Patient connected to face mask oxygen  Post-op Assessment: Report given to RN and Post -op Vital signs reviewed and stable  Post vital signs: Reviewed and stable  Last Vitals:  Vitals Value Taken Time  BP 142/67 02/13/21 0930  Temp    Pulse 79 02/13/21 0931  Resp 15 02/13/21 0931  SpO2 96 % 02/13/21 0931  Vitals shown include unvalidated device data.  Last Pain:  Vitals:   02/13/21 0544  TempSrc: Oral         Complications: No notable events documented.

## 2021-02-14 ENCOUNTER — Encounter (HOSPITAL_COMMUNITY): Payer: Self-pay | Admitting: Orthopaedic Surgery

## 2021-02-14 NOTE — Anesthesia Postprocedure Evaluation (Signed)
Anesthesia Post Note  Patient: Brittany [name]  Procedure(s) Performed: LEFT TOTAL KNEE ARTHROPLASTY (Left: Knee)     Patient location during evaluation: PACU Anesthesia Type: General Level of consciousness: awake and alert Pain management: pain level controlled Vital Signs Assessment: post-procedure vital signs reviewed and stable Respiratory status: spontaneous breathing, nonlabored ventilation, respiratory function stable and patient connected to nasal cannula oxygen Cardiovascular status: blood pressure returned to baseline and stable Postop Assessment: no apparent nausea or vomiting Anesthetic complications: no   No notable events documented.  Last Vitals:  Vitals:   02/13/21 1507 02/13/21 1600  BP: (!) 148/83 (!) 142/82  Pulse: 87 84  Resp: 16 16  Temp:    SpO2: 95% 95%    Last Pain:  Vitals:   02/13/21 1600  TempSrc:   PainSc: 2                  Tiajuana Amass

## 2021-02-15 DIAGNOSIS — Z96652 Presence of left artificial knee joint: Secondary | ICD-10-CM | POA: Diagnosis not present

## 2021-02-15 DIAGNOSIS — M1712 Unilateral primary osteoarthritis, left knee: Secondary | ICD-10-CM | POA: Diagnosis not present

## 2021-02-15 DIAGNOSIS — R262 Difficulty in walking, not elsewhere classified: Secondary | ICD-10-CM | POA: Diagnosis not present

## 2021-02-15 DIAGNOSIS — M6281 Muscle weakness (generalized): Secondary | ICD-10-CM | POA: Diagnosis not present

## 2021-02-15 DIAGNOSIS — I10 Essential (primary) hypertension: Secondary | ICD-10-CM | POA: Diagnosis not present

## 2021-02-16 DIAGNOSIS — M1712 Unilateral primary osteoarthritis, left knee: Secondary | ICD-10-CM | POA: Diagnosis not present

## 2021-02-16 DIAGNOSIS — M6281 Muscle weakness (generalized): Secondary | ICD-10-CM | POA: Diagnosis not present

## 2021-02-16 DIAGNOSIS — Z96652 Presence of left artificial knee joint: Secondary | ICD-10-CM | POA: Diagnosis not present

## 2021-02-16 DIAGNOSIS — R262 Difficulty in walking, not elsewhere classified: Secondary | ICD-10-CM | POA: Diagnosis not present

## 2021-02-19 DIAGNOSIS — Z96652 Presence of left artificial knee joint: Secondary | ICD-10-CM | POA: Diagnosis not present

## 2021-02-19 DIAGNOSIS — M6281 Muscle weakness (generalized): Secondary | ICD-10-CM | POA: Diagnosis not present

## 2021-02-19 DIAGNOSIS — R262 Difficulty in walking, not elsewhere classified: Secondary | ICD-10-CM | POA: Diagnosis not present

## 2021-02-19 DIAGNOSIS — M1712 Unilateral primary osteoarthritis, left knee: Secondary | ICD-10-CM | POA: Diagnosis not present

## 2021-02-20 DIAGNOSIS — M1712 Unilateral primary osteoarthritis, left knee: Secondary | ICD-10-CM | POA: Diagnosis not present

## 2021-02-20 DIAGNOSIS — M6281 Muscle weakness (generalized): Secondary | ICD-10-CM | POA: Diagnosis not present

## 2021-02-20 DIAGNOSIS — R262 Difficulty in walking, not elsewhere classified: Secondary | ICD-10-CM | POA: Diagnosis not present

## 2021-02-20 DIAGNOSIS — Z96652 Presence of left artificial knee joint: Secondary | ICD-10-CM | POA: Diagnosis not present

## 2021-02-22 DIAGNOSIS — R262 Difficulty in walking, not elsewhere classified: Secondary | ICD-10-CM | POA: Diagnosis not present

## 2021-02-22 DIAGNOSIS — M1712 Unilateral primary osteoarthritis, left knee: Secondary | ICD-10-CM | POA: Diagnosis not present

## 2021-02-22 DIAGNOSIS — Z96652 Presence of left artificial knee joint: Secondary | ICD-10-CM | POA: Diagnosis not present

## 2021-02-22 DIAGNOSIS — M6281 Muscle weakness (generalized): Secondary | ICD-10-CM | POA: Diagnosis not present

## 2021-02-23 DIAGNOSIS — M6281 Muscle weakness (generalized): Secondary | ICD-10-CM | POA: Diagnosis not present

## 2021-02-23 DIAGNOSIS — Z471 Aftercare following joint replacement surgery: Secondary | ICD-10-CM | POA: Diagnosis not present

## 2021-02-23 DIAGNOSIS — R262 Difficulty in walking, not elsewhere classified: Secondary | ICD-10-CM | POA: Diagnosis not present

## 2021-02-23 DIAGNOSIS — Z96652 Presence of left artificial knee joint: Secondary | ICD-10-CM | POA: Diagnosis not present

## 2021-02-23 DIAGNOSIS — M1712 Unilateral primary osteoarthritis, left knee: Secondary | ICD-10-CM | POA: Diagnosis not present

## 2021-02-26 ENCOUNTER — Encounter: Payer: Self-pay | Admitting: Internal Medicine

## 2021-02-26 ENCOUNTER — Telehealth: Payer: Self-pay | Admitting: Internal Medicine

## 2021-02-26 DIAGNOSIS — M1712 Unilateral primary osteoarthritis, left knee: Secondary | ICD-10-CM | POA: Diagnosis not present

## 2021-02-26 DIAGNOSIS — R262 Difficulty in walking, not elsewhere classified: Secondary | ICD-10-CM | POA: Diagnosis not present

## 2021-02-26 DIAGNOSIS — M6281 Muscle weakness (generalized): Secondary | ICD-10-CM | POA: Diagnosis not present

## 2021-02-26 DIAGNOSIS — Z96652 Presence of left artificial knee joint: Secondary | ICD-10-CM | POA: Diagnosis not present

## 2021-02-26 NOTE — Telephone Encounter (Signed)
Attempted phone call to pt and left voicemail message to contact RN at [phone]. 

## 2021-02-26 NOTE — Telephone Encounter (Signed)
Error. No encounter needed

## 2021-02-26 NOTE — Telephone Encounter (Signed)
Pt c/o Shortness Of Breath: STAT if SOB developed within the last 24 hours or pt is noticeably SOB on the phone  1. Are you currently SOB (can you hear that pt is SOB on the phone)? SOB  2. How long have you been experiencing SOB? Since 02/13/21  3. Are you SOB when sitting or when up moving around? When up moving around  4. Are you currently experiencing any other symptoms? No

## 2021-02-27 DIAGNOSIS — R262 Difficulty in walking, not elsewhere classified: Secondary | ICD-10-CM | POA: Diagnosis not present

## 2021-02-27 DIAGNOSIS — M6281 Muscle weakness (generalized): Secondary | ICD-10-CM | POA: Diagnosis not present

## 2021-02-27 DIAGNOSIS — Z96652 Presence of left artificial knee joint: Secondary | ICD-10-CM | POA: Diagnosis not present

## 2021-02-27 DIAGNOSIS — M1712 Unilateral primary osteoarthritis, left knee: Secondary | ICD-10-CM | POA: Diagnosis not present

## 2021-02-28 ENCOUNTER — Ambulatory Visit (INDEPENDENT_AMBULATORY_CARE_PROVIDER_SITE_OTHER): Payer: Medicare Other

## 2021-02-28 DIAGNOSIS — I495 Sick sinus syndrome: Secondary | ICD-10-CM | POA: Diagnosis not present

## 2021-02-28 LAB — CUP PACEART REMOTE DEVICE CHECK
Battery Impedance: 3454 Ohm
Battery Remaining Longevity: 15 mo
Battery Voltage: 2.71 V
Brady Statistic AP VP Percent: 4 %
Brady Statistic AP VS Percent: 74 %
Brady Statistic AS VP Percent: 3 %
Brady Statistic AS VS Percent: 19 %
Date Time Interrogation Session: 20220810100111
Implantable Lead Implant Date: 20121213
Implantable Lead Implant Date: 20121213
Implantable Lead Location: 753859
Implantable Lead Location: 753860
Implantable Lead Model: 4076
Implantable Lead Model: 4076
Implantable Lead Serial Number: 835025
Implantable Lead Serial Number: 869269
Implantable Pulse Generator Implant Date: 20121213
Lead Channel Impedance Value: 326 Ohm
Lead Channel Impedance Value: 450 Ohm
Lead Channel Pacing Threshold Amplitude: 0.625 V
Lead Channel Pacing Threshold Amplitude: 0.875 V
Lead Channel Pacing Threshold Pulse Width: 0.4 ms
Lead Channel Pacing Threshold Pulse Width: 0.4 ms
Lead Channel Setting Pacing Amplitude: 2 V
Lead Channel Setting Pacing Amplitude: 2.5 V
Lead Channel Setting Pacing Pulse Width: 0.4 ms
Lead Channel Setting Sensing Sensitivity: 2.8 mV

## 2021-03-01 DIAGNOSIS — R262 Difficulty in walking, not elsewhere classified: Secondary | ICD-10-CM | POA: Diagnosis not present

## 2021-03-01 DIAGNOSIS — Z96652 Presence of left artificial knee joint: Secondary | ICD-10-CM | POA: Diagnosis not present

## 2021-03-01 DIAGNOSIS — M6281 Muscle weakness (generalized): Secondary | ICD-10-CM | POA: Diagnosis not present

## 2021-03-01 DIAGNOSIS — M1712 Unilateral primary osteoarthritis, left knee: Secondary | ICD-10-CM | POA: Diagnosis not present

## 2021-03-01 NOTE — Telephone Encounter (Signed)
Most recent transmission 02/28/21 reviewed. 3 episodes noted since knee surgery 02/13/21, most recent 02/19/21 x 2 for 2 min, 25 sec and 1 min, 23 sec. Longest episode 7/28 for 2 hours 16 min.

## 2021-03-01 NOTE — Telephone Encounter (Signed)
Spoke with pt who complains of DOE increasing over the last 2 weeks.  Pt denies edema other than her left knee at surgical site.  Pt denies CP but does complain of some mild dizziness changing positions.  She is taking medications as prescribed. Pt states she was scheduled for home remote check yesterday and wonders if she is in AFIB. Will forward to device clinic to review remote check.  Reviewed ED precautions who verbalizes understanding and agrees with current plan.

## 2021-03-01 NOTE — Telephone Encounter (Signed)
Spoke with pt and advised of Afib episodes according to remote monitoring.  Appointment scheduled with Oda Kilts, [place] for 03/06/2021 at 12 noon.  Encouraged pt to also contact her PCP  to schedule appointment re: DOE as pt is also having some symptoms of wheezing.  Reviewed again ED precautions.  Pt verbalizes understanding and agrees with current plan.

## 2021-03-01 NOTE — Telephone Encounter (Signed)
Pt c/o Shortness Of Breath: STAT if SOB developed within the last 24 hours or pt is noticeably SOB on the phone  1. Are you currently SOB (can you hear that pt is SOB on the phone)? Yes, can hear over the phone   2. How long have you been experiencing SOB? Since 02/13/21   3. Are you SOB when sitting or when up moving around? Moving around  4. Are you currently experiencing any other symptoms? No     Since the day of knee replacement.

## 2021-03-02 DIAGNOSIS — Z96652 Presence of left artificial knee joint: Secondary | ICD-10-CM | POA: Diagnosis not present

## 2021-03-02 DIAGNOSIS — R262 Difficulty in walking, not elsewhere classified: Secondary | ICD-10-CM | POA: Diagnosis not present

## 2021-03-02 DIAGNOSIS — M6281 Muscle weakness (generalized): Secondary | ICD-10-CM | POA: Diagnosis not present

## 2021-03-02 DIAGNOSIS — M1712 Unilateral primary osteoarthritis, left knee: Secondary | ICD-10-CM | POA: Diagnosis not present

## 2021-03-04 NOTE — Progress Notes (Signed)
Electrophysiology Office Note Date: 03/06/2021  ID:  Brittany [name], DOB [birth year], MRN [id number]  PCP: Donald Prose, MD Primary Cardiologist: Virl Axe, MD Electrophysiologist: Virl Axe, MD   CC: PAF and DOE.   Brittany [name] is a 87 y.o. female seen today for Virl Axe, MD for acute visit due to paroxysmal atrial fibrillation and DOE .  Since last being seen in our clinic the patient reports more SOB and fatiguye after her recent knee surgery. This being only three weeks ago.  AF burden bumped on the day of surgery and for a couple of days after, but she denies chest pain, palpitations, dyspnea, PND, orthopnea, nausea, vomiting, dizziness, syncope, edema, weight gain, or early satiety.  Device History: Medtronic Dual Chamber PPM implanted 06/2011 for symptomatic bradycardia  Past Medical History:  Diagnosis Date   Ankle fracture 2016   Arthritis    Atrial fibrillation (HCC)    Depression    Dysrhythmia    Elevated parathyroid hormone    Fracture of orbital floor with routine healing    GERD (gastroesophageal reflux disease)    History of colon polyps    benign   Hypercalcemia    Hypertension    Hyponatremia    Hypothyroid    Macular degeneration    wet in the right and dry in the left   OSA on CPAP    Osteoporosis    Peripheral vascular disease (HCC)    Presence of permanent cardiac pacemaker    PSVT (paroxysmal supraventricular tachycardia) (HCC)    Rotator cuff tear    Sinus node dysfunction (HCC)    a. s/p MDT pacemaker   Stroke [facility])    Urinary incontinence    Wrist fracture 2018   Past Surgical History:  Procedure Laterality Date   BACK SURGERY     CATARACT EXTRACTION     CATARACT EXTRACTION W/ INTRAOCULAR LENS  IMPLANT, BILATERAL Bilateral    COLONOSCOPY     ESOPHAGOGASTRODUODENOSCOPY     IR ANGIO INTRA EXTRACRAN SEL COM CAROTID INNOMINATE BILAT MOD SED  12/30/2017   IR ANGIO VERTEBRAL SEL VERTEBRAL BILAT MOD SED  12/30/2017    LAPAROSCOPIC CHOLECYSTECTOMY  08/11/1999   LUMBAR [place] SURGERY  02/19/2014   ORIF ANKLE FRACTURE Left 04/13/2015   Procedure: OPEN REDUCTION INTERNAL FIXATION (ORIF) LEFT ANKLE FRACTURE;  Surgeon: Meredith Pel, MD;  Location: [facility];  Service: Orthopedics;  Laterality: Left;   PACEMAKER PLACEMENT Right 07/22/2010   a. MDT dual chamber PPM implanted by Dr Caryl Comes    SHOULDER ARTHROSCOPY W/ ROTATOR CUFF REPAIR Right 08/30/2014   WITH MINI-OPEN ROTATOR CUFF REPAIR AND SUBACROMIAL DECOMPRESSION   SHOULDER ARTHROSCOPY WITH ROTATOR CUFF REPAIR AND SUBACROMIAL DECOMPRESSION Right 08/30/2014   Procedure: SHOULDER ARTHROSCOPY WITH MINI-OPEN ROTATOR CUFF REPAIR AND SUBACROMIAL DECOMPRESSION;  Surgeon: Meredith Pel, MD;  Location: [facility];  Service: Orthopedics;  Laterality: Right;  RIGHT SHOULDER DIAGNOSTIC OPERATIVE ARTHROSCOPY, SUBACROMIAL DECOMPRESSION, MINI-OPEN ROTATOR CUFF REPAIR.   TOTAL KNEE ARTHROPLASTY Left 02/13/2021   Procedure: LEFT TOTAL KNEE ARTHROPLASTY;  Surgeon: Melrose Nakayama, MD;  Location: [facility];  Service: Orthopedics;  Laterality: Left;   VAGINAL HYSTERECTOMY  07/22/1968   WRIST FRACTURE SURGERY Left     Current Outpatient Medications  Medication Sig Dispense Refill   acetaminophen (TYLENOL) 500 MG tablet Take 1,000 mg by mouth 2 (two) times daily.     alendronate (FOSAMAX) 70 MG tablet Take 1 tablet (70 mg total) by mouth every 7 (seven) days. Take  with a full glass of water on an empty stomach. (Patient taking differently: Take 70 mg by mouth every Wednesday. Take with a full glass of water on an empty stomach.) 4 tablet 11   atorvastatin (LIPITOR) 40 MG tablet Take 1 tablet (40 mg total) by mouth daily at 6 PM. 30 tablet 3   B Complex-C (B-COMPLEX WITH VITAMIN C) tablet Take 1 tablet by mouth daily with lunch.      Cholecalciferol (VITAMIN D3) 5000 UNITS CAPS Take 5,000 Units by mouth daily with breakfast.      desmopressin (DDAVP) 0.2 MG tablet Take 0.2 mg by mouth  daily as needed (to control bladder on car trip).      dofetilide (TIKOSYN) 500 MCG capsule TAKE ONE CAPSULE BY MOUTH TWICE DAILY (Patient taking differently: Take 500 mcg by mouth 2 (two) times daily.) 60 capsule 11   HYDROcodone-acetaminophen (NORCO/VICODIN) 5-325 MG tablet Take 1-2 tablets by mouth every 6 (six) hours as needed for moderate pain or severe pain (post op pain). 40 tablet 0   lisinopril (PRINIVIL,ZESTRIL) 20 MG tablet Take 1 tablet (20 mg total) by mouth daily. 90 tablet 1   lisinopril (ZESTRIL) 40 MG tablet Take 40 mg by mouth daily.     magnesium oxide (MAG-OX) 400 MG tablet Take 1 tablet (400 mg total) by mouth daily. 30 tablet 0   Melatonin 10 MG TABS Take 20 mg by mouth at bedtime.     metoprolol succinate (TOPROL-XL) 50 MG 24 hr tablet Take 2 tablets by mouth twice daily with or immediately following meals (Patient taking differently: Take 100 mg by mouth 2 (two) times daily.) 360 tablet 2   Multiple Vitamin (MULTIVITAMIN WITH MINERALS) TABS tablet Take 1 tablet by mouth daily with breakfast.      Multiple Vitamins-Minerals (PRESERVISION AREDS 2 PO) Take 1 capsule by mouth 2 (two) times daily with a meal.      PRESCRIPTION MEDICATION Inhale into the lungs at bedtime. CPAP     tiZANidine (ZANAFLEX) 2 MG tablet Take 1-2 tablets (2-4 mg total) by mouth every 6 (six) hours as needed for muscle spasms. 40 tablet 1   apixaban (ELIQUIS) 5 MG TABS tablet Take 1 tablet (5 mg total) by mouth 2 (two) times daily. 180 tablet 3   diltiazem (CARDIZEM CD) 120 MG 24 hr capsule Take 1 capsule (120 mg total) by mouth in the morning and at bedtime. 180 capsule 2   Lutein 20 MG TABS Take 20 mg by mouth daily with breakfast.  (Patient not taking: Reported on 03/06/2021)     sertraline (ZOLOFT) 50 MG tablet Take 1 tablet (50 mg total) by mouth daily. 30 tablet 2   No current facility-administered medications for this visit.    Allergies:   Adhesive [tape], Benzalkonium chloride, and Neosporin  [neomycin-polymyxin-gramicidin]   Social History: Social History   Socioeconomic History   Marital status: Married    Spouse name: Not on file   Number of children: 4   Years of education: 12   Highest education level: Not on file  Occupational History   Occupation: Clinical research associate     Comment: retired  Tobacco Use   Smoking status: Former    Packs/day: 1.00    Years: 45.00    Pack years: 45.00    Types: Cigarettes    Quit date: 1992    Years since quitting: 30.6   Smokeless tobacco: Never   Tobacco comments:    quit smoking in [place]  Use   Vaping Use: Never used  Substance and Sexual Activity   Alcohol use: Yes    Alcohol/week: 28.0 standard drinks    Types: 28 Glasses of wine per week    Comment: "16oz wine q night" every night   Drug use: No   Sexual activity: Yes    Birth control/protection: Surgical  Other Topics Concern   Not on file  Social History Narrative   Ms. Strawther lives with her husband at [employer]. They are retired and recently relocated to [facility] from [place]. She has 4 grown children.    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   Social Determinants of Health   Financial Resource Strain: Not on file  Food Insecurity: Not on file  Transportation Needs: Not on file  Physical Activity: Not on file  Stress: Not on file  Social Connections: Not on file  Intimate Partner Violence: Not on file    Family History: Family History  Problem Relation Age of Onset   Colon cancer Mother        colon   Cancer Father        unknown type   Stroke Sister    Skin cancer Brother    Heart disease Brother    Breast cancer Paternal Aunt    Asthma Brother      Review of Systems: All other systems reviewed and are otherwise negative except as noted above.  Physical Exam: Vitals:   03/06/21 1216  BP: 128/74  Pulse: 61  SpO2: 97%  Weight: 203 lb (92.1 kg)  Height: '5\' 8"'$  (1.727 m)     GEN- The  patient is well appearing, alert and oriented x 3 today.   HEENT: normocephalic, atraumatic; sclera clear, conjunctiva pink; hearing intact; oropharynx clear; neck supple  Lungs- Clear to ausculation bilaterally, normal work of breathing.  No wheezes, rales, rhonchi Heart- Regular rate and rhythm, no murmurs, rubs or gallops  GI- soft, non-tender, non-distended, bowel sounds present  Extremities- no clubbing or cyanosis. No edema MS- no significant deformity or atrophy Skin- warm and dry, no rash or lesion; PPM pocket well healed Psych- euthymic mood, full affect Neuro- strength and sensation are intact  PPM Interrogation- reviewed in detail today,  See PACEART report  EKG:  EKG is ordered today. Personal review of ekg ordered today shows AP-VS at 61 bpm, QTc ~ 460-480 when measured manually   Recent Labs: 05/02/2020: Magnesium 1.9 02/08/2021: BUN 14; Creatinine, Ser 0.64; Hemoglobin 14.1; Platelets 310; Potassium 3.9; Sodium 138   Wt Readings from Last 3 Encounters:  03/06/21 203 lb (92.1 kg)  01/05/21 213 lb 9.6 oz (96.9 kg)  06/21/20 205 lb 9.6 oz (93.3 kg)     Other studies Reviewed: Additional studies/ records that were reviewed today include: Previous EP office notes, Previous remote checks, Most recent labwork.   Assessment and Plan:  1. Symptomatic bradycardia s/p Medtronic PPM  Normal PPM function See Pace Art report No changes today EKG today shows NSR with stable QTc on tikosyn  2. Peripheral edema DVT US 01/31/21 negative Stable today.  3. DOE Multifactorial. She is not volume overloaded on exam. Echo 12/2017 LVEF 55-60% She reports at home her O2 intermittently drops into mid to upper 80s. 97% after walking in today. She has follow up with PCP early next week. Encouraged to keep a log of her O2 drops (what it dropped to and what she was doing) to her PCP appointment  and have them ambulate her to consider O2 if continues to drop.  She does have a h/o of tobacco  abuse.  Current medicines are reviewed at length with the patient today.   The patient does not have concerns regarding her medicines.  The following changes were made today:  none  Labs/ tests ordered today include:  Orders Placed This Encounter  Procedures   Basic metabolic panel   Magnesium   EKG 12-Lead    Disposition:   Follow up with Dr. Caryl Comes in 6 Months    Signed, Shirley Friar, PA-C  03/06/2021 12:40 PM  [address] [place] [address] [phone] (office) [phone] (fax)

## 2021-03-05 DIAGNOSIS — R262 Difficulty in walking, not elsewhere classified: Secondary | ICD-10-CM | POA: Diagnosis not present

## 2021-03-05 DIAGNOSIS — M1712 Unilateral primary osteoarthritis, left knee: Secondary | ICD-10-CM | POA: Diagnosis not present

## 2021-03-05 DIAGNOSIS — Z96652 Presence of left artificial knee joint: Secondary | ICD-10-CM | POA: Diagnosis not present

## 2021-03-05 DIAGNOSIS — M6281 Muscle weakness (generalized): Secondary | ICD-10-CM | POA: Diagnosis not present

## 2021-03-06 ENCOUNTER — Encounter: Payer: Self-pay | Admitting: Student

## 2021-03-06 ENCOUNTER — Ambulatory Visit (INDEPENDENT_AMBULATORY_CARE_PROVIDER_SITE_OTHER): Payer: Medicare Other | Admitting: Student

## 2021-03-06 ENCOUNTER — Other Ambulatory Visit: Payer: Self-pay

## 2021-03-06 VITALS — BP 128/74 | HR 61 | Ht 68.0 in | Wt 203.0 lb

## 2021-03-06 DIAGNOSIS — M1712 Unilateral primary osteoarthritis, left knee: Secondary | ICD-10-CM | POA: Diagnosis not present

## 2021-03-06 DIAGNOSIS — Z96652 Presence of left artificial knee joint: Secondary | ICD-10-CM | POA: Diagnosis not present

## 2021-03-06 DIAGNOSIS — R001 Bradycardia, unspecified: Secondary | ICD-10-CM

## 2021-03-06 DIAGNOSIS — R262 Difficulty in walking, not elsewhere classified: Secondary | ICD-10-CM | POA: Diagnosis not present

## 2021-03-06 DIAGNOSIS — R600 Localized edema: Secondary | ICD-10-CM | POA: Diagnosis not present

## 2021-03-06 DIAGNOSIS — I4819 Other persistent atrial fibrillation: Secondary | ICD-10-CM

## 2021-03-06 DIAGNOSIS — M6281 Muscle weakness (generalized): Secondary | ICD-10-CM | POA: Diagnosis not present

## 2021-03-06 DIAGNOSIS — I495 Sick sinus syndrome: Secondary | ICD-10-CM

## 2021-03-06 LAB — CUP PACEART INCLINIC DEVICE CHECK
Battery Impedance: 3470 Ohm
Battery Remaining Longevity: 15 mo
Battery Voltage: 2.71 V
Brady Statistic AP VP Percent: 4 %
Brady Statistic AP VS Percent: 76 %
Brady Statistic AS VP Percent: 3 %
Brady Statistic AS VS Percent: 17 %
Date Time Interrogation Session: 20220816123838
Implantable Lead Implant Date: 20121213
Implantable Lead Implant Date: 20121213
Implantable Lead Location: 753859
Implantable Lead Location: 753860
Implantable Lead Model: 4076
Implantable Lead Model: 4076
Implantable Lead Serial Number: 835025
Implantable Lead Serial Number: 869269
Implantable Pulse Generator Implant Date: 20121213
Lead Channel Impedance Value: 356 Ohm
Lead Channel Impedance Value: 504 Ohm
Lead Channel Pacing Threshold Amplitude: 0.625 V
Lead Channel Pacing Threshold Amplitude: 0.75 V
Lead Channel Pacing Threshold Amplitude: 0.75 V
Lead Channel Pacing Threshold Amplitude: 0.875 V
Lead Channel Pacing Threshold Pulse Width: 0.4 ms
Lead Channel Pacing Threshold Pulse Width: 0.4 ms
Lead Channel Pacing Threshold Pulse Width: 0.4 ms
Lead Channel Pacing Threshold Pulse Width: 0.4 ms
Lead Channel Sensing Intrinsic Amplitude: 8 mV
Lead Channel Setting Pacing Amplitude: 2 V
Lead Channel Setting Pacing Amplitude: 2.5 V
Lead Channel Setting Pacing Pulse Width: 0.4 ms
Lead Channel Setting Sensing Sensitivity: 2.8 mV

## 2021-03-06 MED ORDER — APIXABAN 5 MG PO TABS: 5.0000 mg | ORAL_TABLET | Freq: Two times a day (BID) | ORAL | 3 refills | Status: DC

## 2021-03-06 NOTE — Patient Instructions (Signed)
Medication Instructions:  Your physician recommends that you continue on your current medications as directed. Please refer to the Current Medication list given to you today.  *If you need a refill on your cardiac medications before your next appointment, please call your pharmacy*   Lab Work: TODAY: BMET, Mg  If you have labs (blood work) drawn today and your tests are completely normal, you will receive your results only by: [place] (if you have MyChart) OR A paper copy in the mail If you have any lab test that is abnormal or we need to change your treatment, we will call you to review the results.  Follow-Up: At [facility], you and your health needs are our priority.  As part of our continuing mission to provide you with exceptional heart care, we have created designated Provider Care Teams.  These Care Teams include your primary Cardiologist (physician) and Advanced Practice Providers (APPs -  Physician Assistants and Nurse Practitioners) who all work together to provide you with the care you need, when you need it.   Your next appointment:   6 month(s)  The format for your next appointment:   In Person  Provider:   You may see Virl Axe, MD or one of the following Advanced Practice Providers on your designated Care Team:   Tommye Standard, [place] [facility]" [place], [place]

## 2021-03-07 LAB — BASIC METABOLIC PANEL
BUN/Creatinine Ratio: 16 (ref 12–28)
BUN: 8 mg/dL (ref 8–27)
CO2: 24 mmol/L (ref 20–29)
Calcium: 10.5 mg/dL — ABNORMAL HIGH (ref 8.7–10.3)
Chloride: 101 mmol/L (ref 96–106)
Creatinine, Ser: 0.49 mg/dL — ABNORMAL LOW (ref 0.57–1.00)
Glucose: 103 mg/dL — ABNORMAL HIGH (ref 65–99)
Potassium: 4.5 mmol/L (ref 3.5–5.2)
Sodium: 139 mmol/L (ref 134–144)
eGFR: 93 mL/min/{1.73_m2} (ref >59)

## 2021-03-07 LAB — MAGNESIUM: Magnesium: 2 mg/dL (ref 1.6–2.3)

## 2021-03-08 DIAGNOSIS — R262 Difficulty in walking, not elsewhere classified: Secondary | ICD-10-CM | POA: Diagnosis not present

## 2021-03-08 DIAGNOSIS — M6281 Muscle weakness (generalized): Secondary | ICD-10-CM | POA: Diagnosis not present

## 2021-03-08 DIAGNOSIS — Z96652 Presence of left artificial knee joint: Secondary | ICD-10-CM | POA: Diagnosis not present

## 2021-03-08 DIAGNOSIS — M1712 Unilateral primary osteoarthritis, left knee: Secondary | ICD-10-CM | POA: Diagnosis not present

## 2021-03-09 DIAGNOSIS — Z96652 Presence of left artificial knee joint: Secondary | ICD-10-CM | POA: Diagnosis not present

## 2021-03-09 DIAGNOSIS — M6281 Muscle weakness (generalized): Secondary | ICD-10-CM | POA: Diagnosis not present

## 2021-03-09 DIAGNOSIS — M1712 Unilateral primary osteoarthritis, left knee: Secondary | ICD-10-CM | POA: Diagnosis not present

## 2021-03-09 DIAGNOSIS — R262 Difficulty in walking, not elsewhere classified: Secondary | ICD-10-CM | POA: Diagnosis not present

## 2021-03-12 DIAGNOSIS — Z96659 Presence of unspecified artificial knee joint: Secondary | ICD-10-CM | POA: Diagnosis not present

## 2021-03-12 DIAGNOSIS — R262 Difficulty in walking, not elsewhere classified: Secondary | ICD-10-CM | POA: Diagnosis not present

## 2021-03-12 DIAGNOSIS — Z96652 Presence of left artificial knee joint: Secondary | ICD-10-CM | POA: Diagnosis not present

## 2021-03-12 DIAGNOSIS — I10 Essential (primary) hypertension: Secondary | ICD-10-CM | POA: Diagnosis not present

## 2021-03-12 DIAGNOSIS — R0602 Shortness of breath: Secondary | ICD-10-CM | POA: Diagnosis not present

## 2021-03-12 DIAGNOSIS — M1712 Unilateral primary osteoarthritis, left knee: Secondary | ICD-10-CM | POA: Diagnosis not present

## 2021-03-12 DIAGNOSIS — M6281 Muscle weakness (generalized): Secondary | ICD-10-CM | POA: Diagnosis not present

## 2021-03-12 DIAGNOSIS — N3281 Overactive bladder: Secondary | ICD-10-CM | POA: Diagnosis not present

## 2021-03-13 DIAGNOSIS — M6281 Muscle weakness (generalized): Secondary | ICD-10-CM | POA: Diagnosis not present

## 2021-03-13 DIAGNOSIS — R262 Difficulty in walking, not elsewhere classified: Secondary | ICD-10-CM | POA: Diagnosis not present

## 2021-03-13 DIAGNOSIS — M1712 Unilateral primary osteoarthritis, left knee: Secondary | ICD-10-CM | POA: Diagnosis not present

## 2021-03-13 DIAGNOSIS — Z96652 Presence of left artificial knee joint: Secondary | ICD-10-CM | POA: Diagnosis not present

## 2021-03-15 DIAGNOSIS — R262 Difficulty in walking, not elsewhere classified: Secondary | ICD-10-CM | POA: Diagnosis not present

## 2021-03-15 DIAGNOSIS — Z96652 Presence of left artificial knee joint: Secondary | ICD-10-CM | POA: Diagnosis not present

## 2021-03-15 DIAGNOSIS — M1712 Unilateral primary osteoarthritis, left knee: Secondary | ICD-10-CM | POA: Diagnosis not present

## 2021-03-15 DIAGNOSIS — M6281 Muscle weakness (generalized): Secondary | ICD-10-CM | POA: Diagnosis not present

## 2021-03-16 DIAGNOSIS — M6281 Muscle weakness (generalized): Secondary | ICD-10-CM | POA: Diagnosis not present

## 2021-03-16 DIAGNOSIS — M1712 Unilateral primary osteoarthritis, left knee: Secondary | ICD-10-CM | POA: Diagnosis not present

## 2021-03-16 DIAGNOSIS — R262 Difficulty in walking, not elsewhere classified: Secondary | ICD-10-CM | POA: Diagnosis not present

## 2021-03-16 DIAGNOSIS — Z96652 Presence of left artificial knee joint: Secondary | ICD-10-CM | POA: Diagnosis not present

## 2021-03-19 DIAGNOSIS — Z96652 Presence of left artificial knee joint: Secondary | ICD-10-CM | POA: Diagnosis not present

## 2021-03-19 DIAGNOSIS — M6281 Muscle weakness (generalized): Secondary | ICD-10-CM | POA: Diagnosis not present

## 2021-03-19 DIAGNOSIS — R262 Difficulty in walking, not elsewhere classified: Secondary | ICD-10-CM | POA: Diagnosis not present

## 2021-03-19 DIAGNOSIS — M1712 Unilateral primary osteoarthritis, left knee: Secondary | ICD-10-CM | POA: Diagnosis not present

## 2021-03-20 DIAGNOSIS — R262 Difficulty in walking, not elsewhere classified: Secondary | ICD-10-CM | POA: Diagnosis not present

## 2021-03-20 DIAGNOSIS — M1712 Unilateral primary osteoarthritis, left knee: Secondary | ICD-10-CM | POA: Diagnosis not present

## 2021-03-20 DIAGNOSIS — M6281 Muscle weakness (generalized): Secondary | ICD-10-CM | POA: Diagnosis not present

## 2021-03-20 DIAGNOSIS — Z96652 Presence of left artificial knee joint: Secondary | ICD-10-CM | POA: Diagnosis not present

## 2021-03-21 DIAGNOSIS — I10 Essential (primary) hypertension: Secondary | ICD-10-CM | POA: Diagnosis not present

## 2021-03-21 DIAGNOSIS — E785 Hyperlipidemia, unspecified: Secondary | ICD-10-CM | POA: Diagnosis not present

## 2021-03-21 DIAGNOSIS — I4891 Unspecified atrial fibrillation: Secondary | ICD-10-CM | POA: Diagnosis not present

## 2021-03-21 DIAGNOSIS — M810 Age-related osteoporosis without current pathological fracture: Secondary | ICD-10-CM | POA: Diagnosis not present

## 2021-03-22 DIAGNOSIS — Z96652 Presence of left artificial knee joint: Secondary | ICD-10-CM | POA: Diagnosis not present

## 2021-03-22 DIAGNOSIS — R262 Difficulty in walking, not elsewhere classified: Secondary | ICD-10-CM | POA: Diagnosis not present

## 2021-03-22 DIAGNOSIS — M1712 Unilateral primary osteoarthritis, left knee: Secondary | ICD-10-CM | POA: Diagnosis not present

## 2021-03-22 DIAGNOSIS — M6281 Muscle weakness (generalized): Secondary | ICD-10-CM | POA: Diagnosis not present

## 2021-03-23 NOTE — Progress Notes (Signed)
Remote pacemaker transmission.   

## 2021-03-26 DIAGNOSIS — M6281 Muscle weakness (generalized): Secondary | ICD-10-CM | POA: Diagnosis not present

## 2021-03-26 DIAGNOSIS — M1712 Unilateral primary osteoarthritis, left knee: Secondary | ICD-10-CM | POA: Diagnosis not present

## 2021-03-26 DIAGNOSIS — R262 Difficulty in walking, not elsewhere classified: Secondary | ICD-10-CM | POA: Diagnosis not present

## 2021-03-26 DIAGNOSIS — Z96652 Presence of left artificial knee joint: Secondary | ICD-10-CM | POA: Diagnosis not present

## 2021-03-28 DIAGNOSIS — M6281 Muscle weakness (generalized): Secondary | ICD-10-CM | POA: Diagnosis not present

## 2021-03-28 DIAGNOSIS — M1712 Unilateral primary osteoarthritis, left knee: Secondary | ICD-10-CM | POA: Diagnosis not present

## 2021-03-28 DIAGNOSIS — R262 Difficulty in walking, not elsewhere classified: Secondary | ICD-10-CM | POA: Diagnosis not present

## 2021-03-28 DIAGNOSIS — Z96652 Presence of left artificial knee joint: Secondary | ICD-10-CM | POA: Diagnosis not present

## 2021-03-30 ENCOUNTER — Other Ambulatory Visit: Payer: Self-pay | Admitting: Family Medicine

## 2021-03-30 DIAGNOSIS — M6281 Muscle weakness (generalized): Secondary | ICD-10-CM | POA: Diagnosis not present

## 2021-03-30 DIAGNOSIS — Z96652 Presence of left artificial knee joint: Secondary | ICD-10-CM | POA: Diagnosis not present

## 2021-03-30 DIAGNOSIS — M1712 Unilateral primary osteoarthritis, left knee: Secondary | ICD-10-CM | POA: Diagnosis not present

## 2021-03-30 DIAGNOSIS — Z1231 Encounter for screening mammogram for malignant neoplasm of breast: Secondary | ICD-10-CM

## 2021-03-30 DIAGNOSIS — R262 Difficulty in walking, not elsewhere classified: Secondary | ICD-10-CM | POA: Diagnosis not present

## 2021-04-02 DIAGNOSIS — M1712 Unilateral primary osteoarthritis, left knee: Secondary | ICD-10-CM | POA: Diagnosis not present

## 2021-04-02 DIAGNOSIS — M6281 Muscle weakness (generalized): Secondary | ICD-10-CM | POA: Diagnosis not present

## 2021-04-02 DIAGNOSIS — Z96652 Presence of left artificial knee joint: Secondary | ICD-10-CM | POA: Diagnosis not present

## 2021-04-02 DIAGNOSIS — R262 Difficulty in walking, not elsewhere classified: Secondary | ICD-10-CM | POA: Diagnosis not present

## 2021-04-04 DIAGNOSIS — R262 Difficulty in walking, not elsewhere classified: Secondary | ICD-10-CM | POA: Diagnosis not present

## 2021-04-04 DIAGNOSIS — Z96652 Presence of left artificial knee joint: Secondary | ICD-10-CM | POA: Diagnosis not present

## 2021-04-04 DIAGNOSIS — M1712 Unilateral primary osteoarthritis, left knee: Secondary | ICD-10-CM | POA: Diagnosis not present

## 2021-04-04 DIAGNOSIS — M6281 Muscle weakness (generalized): Secondary | ICD-10-CM | POA: Diagnosis not present

## 2021-04-06 DIAGNOSIS — M1712 Unilateral primary osteoarthritis, left knee: Secondary | ICD-10-CM | POA: Diagnosis not present

## 2021-04-06 DIAGNOSIS — M6281 Muscle weakness (generalized): Secondary | ICD-10-CM | POA: Diagnosis not present

## 2021-04-06 DIAGNOSIS — Z96652 Presence of left artificial knee joint: Secondary | ICD-10-CM | POA: Diagnosis not present

## 2021-04-06 DIAGNOSIS — R262 Difficulty in walking, not elsewhere classified: Secondary | ICD-10-CM | POA: Diagnosis not present

## 2021-04-08 ENCOUNTER — Other Ambulatory Visit: Payer: Self-pay | Admitting: Internal Medicine

## 2021-04-09 DIAGNOSIS — Z96652 Presence of left artificial knee joint: Secondary | ICD-10-CM | POA: Diagnosis not present

## 2021-04-09 DIAGNOSIS — M6281 Muscle weakness (generalized): Secondary | ICD-10-CM | POA: Diagnosis not present

## 2021-04-09 DIAGNOSIS — M1712 Unilateral primary osteoarthritis, left knee: Secondary | ICD-10-CM | POA: Diagnosis not present

## 2021-04-09 DIAGNOSIS — R262 Difficulty in walking, not elsewhere classified: Secondary | ICD-10-CM | POA: Diagnosis not present

## 2021-04-11 DIAGNOSIS — M1712 Unilateral primary osteoarthritis, left knee: Secondary | ICD-10-CM | POA: Diagnosis not present

## 2021-04-11 DIAGNOSIS — R262 Difficulty in walking, not elsewhere classified: Secondary | ICD-10-CM | POA: Diagnosis not present

## 2021-04-11 DIAGNOSIS — Z96652 Presence of left artificial knee joint: Secondary | ICD-10-CM | POA: Diagnosis not present

## 2021-04-11 DIAGNOSIS — M6281 Muscle weakness (generalized): Secondary | ICD-10-CM | POA: Diagnosis not present

## 2021-04-12 DIAGNOSIS — Z23 Encounter for immunization: Secondary | ICD-10-CM | POA: Diagnosis not present

## 2021-04-13 DIAGNOSIS — R262 Difficulty in walking, not elsewhere classified: Secondary | ICD-10-CM | POA: Diagnosis not present

## 2021-04-13 DIAGNOSIS — M810 Age-related osteoporosis without current pathological fracture: Secondary | ICD-10-CM | POA: Diagnosis not present

## 2021-04-13 DIAGNOSIS — M6281 Muscle weakness (generalized): Secondary | ICD-10-CM | POA: Diagnosis not present

## 2021-04-13 DIAGNOSIS — M1712 Unilateral primary osteoarthritis, left knee: Secondary | ICD-10-CM | POA: Diagnosis not present

## 2021-04-13 DIAGNOSIS — I10 Essential (primary) hypertension: Secondary | ICD-10-CM | POA: Diagnosis not present

## 2021-04-13 DIAGNOSIS — I4891 Unspecified atrial fibrillation: Secondary | ICD-10-CM | POA: Diagnosis not present

## 2021-04-13 DIAGNOSIS — Z96652 Presence of left artificial knee joint: Secondary | ICD-10-CM | POA: Diagnosis not present

## 2021-04-13 DIAGNOSIS — E785 Hyperlipidemia, unspecified: Secondary | ICD-10-CM | POA: Diagnosis not present

## 2021-04-16 DIAGNOSIS — R262 Difficulty in walking, not elsewhere classified: Secondary | ICD-10-CM | POA: Diagnosis not present

## 2021-04-16 DIAGNOSIS — M1712 Unilateral primary osteoarthritis, left knee: Secondary | ICD-10-CM | POA: Diagnosis not present

## 2021-04-16 DIAGNOSIS — M6281 Muscle weakness (generalized): Secondary | ICD-10-CM | POA: Diagnosis not present

## 2021-04-16 DIAGNOSIS — Z96652 Presence of left artificial knee joint: Secondary | ICD-10-CM | POA: Diagnosis not present

## 2021-04-18 DIAGNOSIS — R262 Difficulty in walking, not elsewhere classified: Secondary | ICD-10-CM | POA: Diagnosis not present

## 2021-04-18 DIAGNOSIS — M6281 Muscle weakness (generalized): Secondary | ICD-10-CM | POA: Diagnosis not present

## 2021-04-18 DIAGNOSIS — Z96652 Presence of left artificial knee joint: Secondary | ICD-10-CM | POA: Diagnosis not present

## 2021-04-18 DIAGNOSIS — M1712 Unilateral primary osteoarthritis, left knee: Secondary | ICD-10-CM | POA: Diagnosis not present

## 2021-04-20 DIAGNOSIS — Z96652 Presence of left artificial knee joint: Secondary | ICD-10-CM | POA: Diagnosis not present

## 2021-04-20 DIAGNOSIS — R262 Difficulty in walking, not elsewhere classified: Secondary | ICD-10-CM | POA: Diagnosis not present

## 2021-04-20 DIAGNOSIS — M6281 Muscle weakness (generalized): Secondary | ICD-10-CM | POA: Diagnosis not present

## 2021-04-20 DIAGNOSIS — M1712 Unilateral primary osteoarthritis, left knee: Secondary | ICD-10-CM | POA: Diagnosis not present

## 2021-04-23 DIAGNOSIS — Z96652 Presence of left artificial knee joint: Secondary | ICD-10-CM | POA: Diagnosis not present

## 2021-04-23 DIAGNOSIS — M6281 Muscle weakness (generalized): Secondary | ICD-10-CM | POA: Diagnosis not present

## 2021-04-23 DIAGNOSIS — R262 Difficulty in walking, not elsewhere classified: Secondary | ICD-10-CM | POA: Diagnosis not present

## 2021-04-23 DIAGNOSIS — M1712 Unilateral primary osteoarthritis, left knee: Secondary | ICD-10-CM | POA: Diagnosis not present

## 2021-04-25 DIAGNOSIS — M6281 Muscle weakness (generalized): Secondary | ICD-10-CM | POA: Diagnosis not present

## 2021-04-25 DIAGNOSIS — M1712 Unilateral primary osteoarthritis, left knee: Secondary | ICD-10-CM | POA: Diagnosis not present

## 2021-04-25 DIAGNOSIS — R262 Difficulty in walking, not elsewhere classified: Secondary | ICD-10-CM | POA: Diagnosis not present

## 2021-04-25 DIAGNOSIS — Z96652 Presence of left artificial knee joint: Secondary | ICD-10-CM | POA: Diagnosis not present

## 2021-04-27 DIAGNOSIS — Z96652 Presence of left artificial knee joint: Secondary | ICD-10-CM | POA: Diagnosis not present

## 2021-04-27 DIAGNOSIS — R262 Difficulty in walking, not elsewhere classified: Secondary | ICD-10-CM | POA: Diagnosis not present

## 2021-04-27 DIAGNOSIS — M1712 Unilateral primary osteoarthritis, left knee: Secondary | ICD-10-CM | POA: Diagnosis not present

## 2021-04-27 DIAGNOSIS — M6281 Muscle weakness (generalized): Secondary | ICD-10-CM | POA: Diagnosis not present

## 2021-04-30 ENCOUNTER — Telehealth: Payer: Self-pay | Admitting: Internal Medicine

## 2021-04-30 DIAGNOSIS — M6281 Muscle weakness (generalized): Secondary | ICD-10-CM | POA: Diagnosis not present

## 2021-04-30 DIAGNOSIS — Z96652 Presence of left artificial knee joint: Secondary | ICD-10-CM | POA: Diagnosis not present

## 2021-04-30 DIAGNOSIS — R262 Difficulty in walking, not elsewhere classified: Secondary | ICD-10-CM | POA: Diagnosis not present

## 2021-04-30 DIAGNOSIS — M1712 Unilateral primary osteoarthritis, left knee: Secondary | ICD-10-CM | POA: Diagnosis not present

## 2021-04-30 NOTE — Telephone Encounter (Signed)
Pt c/o Shortness Of Breath: STAT if SOB developed within the last 24 hours or pt is noticeably SOB on the phone  1. Are you currently SOB (can you hear that pt is SOB on the phone)? No because she was sitting   2. How long have you been experiencing SOB? Pt stated this has been going on since July   3. Are you SOB when sitting or when up moving around? Anytime she is up and walking around   4. Are you currently experiencing any other symptoms? Pt denies any other symptoms   Best number [phone]

## 2021-04-30 NOTE — Telephone Encounter (Signed)
Spoke with pt who reports she continues to have DOE.  Chest Xray 01/2021 was negative.  Pt was seen and evaluated by Oda Kilts, PA-C 02/2021.  She states PCP monitors BP, HR and O2.  Her O2 continues to drop in the upper 80's during activity but she can sit for a few minutes and O2 rebounds back into the 90's.  She reports taking medications as prescribed.  She denies current CP, dizziness, edema or new fatigue. Pt advised to send a remote transmission and will forward to device clinic for review.  Pt verbalizes understanding and agrees with current plan.

## 2021-04-30 NOTE — Telephone Encounter (Signed)
Reviewed today's manual transmission sent for ongoing DOE. No current arythmias noted. Current EGM AP/VS@ 85. AMS burden from 03/06/21-04/30/21 14.5% with controlled V rates. Last in clinic visit with EP APP 03/06/21. History of PAF with rising burden. +OAC. Unsuccessful phone encounter to patient however husband Brittany [name], on DPR took detailed message including no acute findings on transmission and number for device clinic if patient had any additional questions or concerns.

## 2021-05-01 NOTE — Telephone Encounter (Signed)
Patient called and advised to follow up with PCP ASAP per A. Tillery, PA-C. Patient voices understanding. Appreciative of follow up call.

## 2021-05-02 DIAGNOSIS — M6281 Muscle weakness (generalized): Secondary | ICD-10-CM | POA: Diagnosis not present

## 2021-05-02 DIAGNOSIS — Z96652 Presence of left artificial knee joint: Secondary | ICD-10-CM | POA: Diagnosis not present

## 2021-05-02 DIAGNOSIS — R262 Difficulty in walking, not elsewhere classified: Secondary | ICD-10-CM | POA: Diagnosis not present

## 2021-05-02 DIAGNOSIS — M1712 Unilateral primary osteoarthritis, left knee: Secondary | ICD-10-CM | POA: Diagnosis not present

## 2021-05-04 DIAGNOSIS — M1712 Unilateral primary osteoarthritis, left knee: Secondary | ICD-10-CM | POA: Diagnosis not present

## 2021-05-04 DIAGNOSIS — Z96652 Presence of left artificial knee joint: Secondary | ICD-10-CM | POA: Diagnosis not present

## 2021-05-04 DIAGNOSIS — R262 Difficulty in walking, not elsewhere classified: Secondary | ICD-10-CM | POA: Diagnosis not present

## 2021-05-04 DIAGNOSIS — M6281 Muscle weakness (generalized): Secondary | ICD-10-CM | POA: Diagnosis not present

## 2021-05-07 DIAGNOSIS — M1712 Unilateral primary osteoarthritis, left knee: Secondary | ICD-10-CM | POA: Diagnosis not present

## 2021-05-07 DIAGNOSIS — R262 Difficulty in walking, not elsewhere classified: Secondary | ICD-10-CM | POA: Diagnosis not present

## 2021-05-07 DIAGNOSIS — Z96652 Presence of left artificial knee joint: Secondary | ICD-10-CM | POA: Diagnosis not present

## 2021-05-07 DIAGNOSIS — M6281 Muscle weakness (generalized): Secondary | ICD-10-CM | POA: Diagnosis not present

## 2021-05-08 DIAGNOSIS — S0990XA Unspecified injury of head, initial encounter: Secondary | ICD-10-CM | POA: Diagnosis not present

## 2021-05-08 DIAGNOSIS — W19XXXA Unspecified fall, initial encounter: Secondary | ICD-10-CM | POA: Diagnosis not present

## 2021-05-09 DIAGNOSIS — Z471 Aftercare following joint replacement surgery: Secondary | ICD-10-CM | POA: Diagnosis not present

## 2021-05-09 DIAGNOSIS — M1712 Unilateral primary osteoarthritis, left knee: Secondary | ICD-10-CM | POA: Diagnosis not present

## 2021-05-09 DIAGNOSIS — Z96652 Presence of left artificial knee joint: Secondary | ICD-10-CM | POA: Diagnosis not present

## 2021-05-09 DIAGNOSIS — R262 Difficulty in walking, not elsewhere classified: Secondary | ICD-10-CM | POA: Diagnosis not present

## 2021-05-09 DIAGNOSIS — M6281 Muscle weakness (generalized): Secondary | ICD-10-CM | POA: Diagnosis not present

## 2021-05-11 DIAGNOSIS — Z96652 Presence of left artificial knee joint: Secondary | ICD-10-CM | POA: Diagnosis not present

## 2021-05-11 DIAGNOSIS — M6281 Muscle weakness (generalized): Secondary | ICD-10-CM | POA: Diagnosis not present

## 2021-05-11 DIAGNOSIS — M1712 Unilateral primary osteoarthritis, left knee: Secondary | ICD-10-CM | POA: Diagnosis not present

## 2021-05-11 DIAGNOSIS — R262 Difficulty in walking, not elsewhere classified: Secondary | ICD-10-CM | POA: Diagnosis not present

## 2021-05-14 ENCOUNTER — Ambulatory Visit
Admission: RE | Admit: 2021-05-14 | Discharge: 2021-05-14 | Disposition: A | Discharge status: Home / Self Care | Payer: Medicare Other | Source: Ambulatory Visit | Attending: Family Medicine | Admitting: Family Medicine

## 2021-05-14 ENCOUNTER — Ambulatory Visit: Payer: Medicare Other

## 2021-05-14 ENCOUNTER — Other Ambulatory Visit: Payer: Self-pay

## 2021-05-14 DIAGNOSIS — Z23 Encounter for immunization: Secondary | ICD-10-CM | POA: Diagnosis not present

## 2021-05-14 DIAGNOSIS — Z1231 Encounter for screening mammogram for malignant neoplasm of breast: Secondary | ICD-10-CM

## 2021-05-14 DIAGNOSIS — E785 Hyperlipidemia, unspecified: Secondary | ICD-10-CM | POA: Diagnosis not present

## 2021-05-14 DIAGNOSIS — I10 Essential (primary) hypertension: Secondary | ICD-10-CM | POA: Diagnosis not present

## 2021-05-14 DIAGNOSIS — F411 Generalized anxiety disorder: Secondary | ICD-10-CM | POA: Diagnosis not present

## 2021-05-14 DIAGNOSIS — R0600 Dyspnea, unspecified: Secondary | ICD-10-CM | POA: Diagnosis not present

## 2021-05-16 DIAGNOSIS — M1712 Unilateral primary osteoarthritis, left knee: Secondary | ICD-10-CM | POA: Diagnosis not present

## 2021-05-16 DIAGNOSIS — M6281 Muscle weakness (generalized): Secondary | ICD-10-CM | POA: Diagnosis not present

## 2021-05-16 DIAGNOSIS — R262 Difficulty in walking, not elsewhere classified: Secondary | ICD-10-CM | POA: Diagnosis not present

## 2021-05-16 DIAGNOSIS — Z96652 Presence of left artificial knee joint: Secondary | ICD-10-CM | POA: Diagnosis not present

## 2021-05-18 DIAGNOSIS — M6281 Muscle weakness (generalized): Secondary | ICD-10-CM | POA: Diagnosis not present

## 2021-05-18 DIAGNOSIS — Z96652 Presence of left artificial knee joint: Secondary | ICD-10-CM | POA: Diagnosis not present

## 2021-05-18 DIAGNOSIS — M1712 Unilateral primary osteoarthritis, left knee: Secondary | ICD-10-CM | POA: Diagnosis not present

## 2021-05-18 DIAGNOSIS — R262 Difficulty in walking, not elsewhere classified: Secondary | ICD-10-CM | POA: Diagnosis not present

## 2021-05-21 DIAGNOSIS — M6281 Muscle weakness (generalized): Secondary | ICD-10-CM | POA: Diagnosis not present

## 2021-05-21 DIAGNOSIS — Z96652 Presence of left artificial knee joint: Secondary | ICD-10-CM | POA: Diagnosis not present

## 2021-05-21 DIAGNOSIS — M1712 Unilateral primary osteoarthritis, left knee: Secondary | ICD-10-CM | POA: Diagnosis not present

## 2021-05-21 DIAGNOSIS — R262 Difficulty in walking, not elsewhere classified: Secondary | ICD-10-CM | POA: Diagnosis not present

## 2021-05-23 DIAGNOSIS — M1712 Unilateral primary osteoarthritis, left knee: Secondary | ICD-10-CM | POA: Diagnosis not present

## 2021-05-23 DIAGNOSIS — Z96652 Presence of left artificial knee joint: Secondary | ICD-10-CM | POA: Diagnosis not present

## 2021-05-23 DIAGNOSIS — M6281 Muscle weakness (generalized): Secondary | ICD-10-CM | POA: Diagnosis not present

## 2021-05-23 DIAGNOSIS — R262 Difficulty in walking, not elsewhere classified: Secondary | ICD-10-CM | POA: Diagnosis not present

## 2021-05-24 DIAGNOSIS — Z96652 Presence of left artificial knee joint: Secondary | ICD-10-CM | POA: Diagnosis not present

## 2021-05-24 DIAGNOSIS — M1712 Unilateral primary osteoarthritis, left knee: Secondary | ICD-10-CM | POA: Diagnosis not present

## 2021-05-24 DIAGNOSIS — M6281 Muscle weakness (generalized): Secondary | ICD-10-CM | POA: Diagnosis not present

## 2021-05-24 DIAGNOSIS — R262 Difficulty in walking, not elsewhere classified: Secondary | ICD-10-CM | POA: Diagnosis not present

## 2021-05-25 ENCOUNTER — Other Ambulatory Visit: Payer: Self-pay

## 2021-05-25 ENCOUNTER — Encounter: Payer: Self-pay | Admitting: Pulmonary Disease

## 2021-05-25 ENCOUNTER — Ambulatory Visit (INDEPENDENT_AMBULATORY_CARE_PROVIDER_SITE_OTHER): Payer: Medicare Other | Admitting: Pulmonary Disease

## 2021-05-25 VITALS — BP 124/80 | HR 87 | Ht 68.0 in | Wt 209.8 lb

## 2021-05-25 DIAGNOSIS — G4733 Obstructive sleep apnea (adult) (pediatric): Secondary | ICD-10-CM

## 2021-05-25 DIAGNOSIS — Z9989 Dependence on other enabling machines and devices: Secondary | ICD-10-CM

## 2021-05-25 NOTE — Progress Notes (Signed)
[facility]  Chief Complaint  Patient presents with   Follow-up    43yr f/u for OSA. States she has been using her machine without any problems. Received a letter from DME stating that her mask contains magnets and she should not use it with a pacemaker.     Past Surgical History:  She  has a past surgical history that includes pacemaker placement (Right, 07/22/2010); Cataract extraction; Colonoscopy; Esophagogastroduodenoscopy; Shoulder arthroscopy w/ rotator cuff repair (Right, 08/30/2014); Laparoscopic cholecystectomy (08/11/1999); Lumbar disc surgery (02/19/2014); Back surgery; Vaginal hysterectomy (07/22/1968); Cataract extraction w/ intraocular lens  implant, bilateral (Bilateral); Shoulder arthroscopy with rotator cuff repair and subacromial decompression (Right, 08/30/2014); ORIF ankle fracture (Left, 04/13/2015); IR ANGIO VERTEBRAL SEL VERTEBRAL BILAT MOD SED (12/30/2017); IR ANGIO INTRA EXTRACRAN SEL COM CAROTID INNOMINATE BILAT MOD SED (12/30/2017); Wrist fracture surgery (Left); and Total knee arthroplasty (Left, 02/13/2021).  Past Medical History:  A fib, HTN, Depression, Hypothyroidism, GERD, Hyponatremia, CVA, Sinus node dysfunction s/p PM, PSVT, Osteoporosis, Macular degeneration, Hypercalcemia, Colon polyps  Constitutional:  BP 124/80   Pulse 87   Ht 5\' 8"  (1.727 m)   Wt 209 lb 12.8 oz (95.2 kg)   LMP 07/22/1968   SpO2 96%   BMI 31.90 kg/m   Brief Summary:  Brittany [name] is a 87 y.o. female with obstructive sleep apnea.      Subjective:   She got a replacement Respironics machine.  She was told her mask has magnets and needs to be replaced since she has a pacemaker.  No issues with pressure setting or mask fit otherwise.  Denies sinus congestion, or sore throat.  Physical Exam:   Appearance - well kempt   ENMT - no sinus tenderness, no oral exudate, no LAN, Mallampati 3 airway, no stridor  Respiratory - equal  breath sounds bilaterally, no wheezing or rales  CV - s1s2 regular rate and rhythm, no murmurs  Ext - no clubbing, no edema  Skin - no rashes  Psych - normal mood and affect   Sleep Tests:  PSG 08/13/12 ([place]) >> AHI 24, SaO2 low 72%, CPAP 8 PSG 07/03/14 >> AHI 24.4, SaO2 low 76%, PLMI 81.8.  CPAP 8 cm H2O >> AHI 0, +S.   CPAP 04/24/21 to 05/23/21 >> used on 29 of 30 nights with average 9 hrs 16 min.  Average AHI 2.5 with CPAP 8 cm H2O  Cardiac Tests:  Echo 12/31/17 >> EF 55 to 60%, mild AR  Social History:  She  reports that she quit smoking about 30 years ago. Her smoking use included cigarettes. She has a 45.00 pack-year smoking history. She has never used smokeless tobacco. She reports current alcohol use of about 28.0 standard drinks per week. She reports that she does not use drugs.  Family History:  Her family history includes Asthma in her brother; Breast cancer in her paternal aunt; Cancer in her father; Colon cancer in her mother; Heart disease in her brother; Skin cancer in her brother; Stroke in her sister.     Assessment/Plan:   Obstructive sleep apnea. - she is compliant with CPAP and reports benefit -- she uses [employer] for her DME - will arrange for new mask w/o magnet - continue CPAP 8 cm H2O   Atrial fibrillation. - followed by Dr. Caryl Comes with Cardiology  Time Spent Involved in Patient Care on Day of Examination:  14 minutes  Follow up:   Patient Instructions  Will arrange for new CPAP  mask that doesn't have magnets  Follow up in 1 year  Medication List:   Allergies as of 05/25/2021       Reactions   Adhesive [tape] Swelling, Rash   Benzalkonium Chloride Rash   Pt was not aware of this allergy   Neosporin [neomycin-polymyxin-gramicidin] Swelling, Rash        Medication List        Accurate as of May 25, 2021 12:08 PM. If you have any questions, ask your nurse or doctor.          STOP taking these medications     HYDROcodone-acetaminophen 5-325 MG tablet Commonly known as: NORCO/VICODIN Stopped by: Chesley Mires, MD   tiZANidine 2 MG tablet Commonly known as: ZANAFLEX Stopped by: Chesley Mires, MD       TAKE these medications    acetaminophen 500 MG tablet Commonly known as: TYLENOL Take 1,000 mg by mouth 2 (two) times daily.   alendronate 70 MG tablet Commonly known as: FOSAMAX Take 1 tablet (70 mg total) by mouth every 7 (seven) days. Take with a full glass of water on an empty stomach. What changed: when to take this   apixaban 5 MG Tabs tablet Commonly known as: ELIQUIS Take 1 tablet (5 mg total) by mouth 2 (two) times daily.   atorvastatin 40 MG tablet Commonly known as: LIPITOR Take 1 tablet (40 mg total) by mouth daily at 6 PM.   B-complex with vitamin C tablet Take 1 tablet by mouth daily with lunch.   desmopressin 0.2 MG tablet Commonly known as: DDAVP Take 0.2 mg by mouth daily as needed (to control bladder on car trip).   diltiazem 120 MG 24 hr capsule Commonly known as: CARDIZEM CD Take 1 capsule (120 mg total) by mouth in the morning and at bedtime.   dofetilide 500 MCG capsule Commonly known as: TIKOSYN TAKE ONE CAPSULE BY MOUTH TWICE DAILY   lisinopril 40 MG tablet Commonly known as: ZESTRIL Take 40 mg by mouth daily. What changed: Another medication with the same name was removed. Continue taking this medication, and follow the directions you see here. Changed by: Chesley Mires, MD   Lutein 20 MG Tabs Take 20 mg by mouth daily with breakfast.   magnesium oxide 400 MG tablet Commonly known as: MAG-OX Take 1 tablet (400 mg total) by mouth daily.   Melatonin 10 MG Tabs Take 20 mg by mouth at bedtime.   metoprolol succinate 50 MG 24 hr tablet Commonly known as: TOPROL-XL Take 2 tablets by mouth twice daily with or immediately following meals   multivitamin with minerals Tabs tablet Take 1 tablet by mouth daily with breakfast.   PRESCRIPTION  MEDICATION Inhale into the lungs at bedtime. CPAP   PRESERVISION AREDS 2 PO Take 1 capsule by mouth 2 (two) times daily with a meal.   sertraline 50 MG tablet Commonly known as: Zoloft Take 1 tablet (50 mg total) by mouth daily.   Vitamin D3 125 MCG (5000 UT) Caps Take 5,000 Units by mouth daily with breakfast.        Signature:  Chesley Mires, MD [place] Pager - [phone] 05/25/2021, 12:08 PM

## 2021-05-25 NOTE — Patient Instructions (Signed)
Will arrange for new CPAP mask that doesn't have magnets  Follow up in 1 year

## 2021-05-28 ENCOUNTER — Telehealth (HOSPITAL_COMMUNITY): Payer: Self-pay

## 2021-05-28 ENCOUNTER — Other Ambulatory Visit (HOSPITAL_COMMUNITY): Payer: Self-pay | Admitting: Interventional Radiology

## 2021-05-28 DIAGNOSIS — M6281 Muscle weakness (generalized): Secondary | ICD-10-CM | POA: Diagnosis not present

## 2021-05-28 DIAGNOSIS — Z96652 Presence of left artificial knee joint: Secondary | ICD-10-CM | POA: Diagnosis not present

## 2021-05-28 DIAGNOSIS — M1712 Unilateral primary osteoarthritis, left knee: Secondary | ICD-10-CM | POA: Diagnosis not present

## 2021-05-28 DIAGNOSIS — R262 Difficulty in walking, not elsewhere classified: Secondary | ICD-10-CM | POA: Diagnosis not present

## 2021-05-28 NOTE — Telephone Encounter (Signed)
Called to schedule us carotid, no answer, left vm. AW  

## 2021-05-29 ENCOUNTER — Other Ambulatory Visit (HOSPITAL_COMMUNITY): Payer: Self-pay | Admitting: Interventional Radiology

## 2021-05-29 DIAGNOSIS — I771 Stricture of artery: Secondary | ICD-10-CM

## 2021-05-30 ENCOUNTER — Ambulatory Visit (INDEPENDENT_AMBULATORY_CARE_PROVIDER_SITE_OTHER): Payer: Medicare Other

## 2021-05-30 DIAGNOSIS — I10 Essential (primary) hypertension: Secondary | ICD-10-CM | POA: Diagnosis not present

## 2021-05-30 DIAGNOSIS — R262 Difficulty in walking, not elsewhere classified: Secondary | ICD-10-CM | POA: Diagnosis not present

## 2021-05-30 DIAGNOSIS — M1712 Unilateral primary osteoarthritis, left knee: Secondary | ICD-10-CM | POA: Diagnosis not present

## 2021-05-30 DIAGNOSIS — M6281 Muscle weakness (generalized): Secondary | ICD-10-CM | POA: Diagnosis not present

## 2021-05-30 DIAGNOSIS — I495 Sick sinus syndrome: Secondary | ICD-10-CM | POA: Diagnosis not present

## 2021-05-30 DIAGNOSIS — Z96652 Presence of left artificial knee joint: Secondary | ICD-10-CM | POA: Diagnosis not present

## 2021-05-30 LAB — CUP PACEART REMOTE DEVICE CHECK
Battery Impedance: 3844 Ohm
Battery Remaining Longevity: 13 mo
Battery Voltage: 2.71 V
Brady Statistic AP VP Percent: 3 %
Brady Statistic AP VS Percent: 32 %
Brady Statistic AS VP Percent: 8 %
Brady Statistic AS VS Percent: 57 %
Date Time Interrogation Session: 20221109114652
Implantable Lead Implant Date: 20121213
Implantable Lead Implant Date: 20121213
Implantable Lead Location: 753859
Implantable Lead Location: 753860
Implantable Lead Model: 4076
Implantable Lead Model: 4076
Implantable Lead Serial Number: 835025
Implantable Lead Serial Number: 869269
Implantable Pulse Generator Implant Date: 20121213
Lead Channel Impedance Value: 406 Ohm
Lead Channel Impedance Value: 534 Ohm
Lead Channel Pacing Threshold Amplitude: 0.625 V
Lead Channel Pacing Threshold Amplitude: 0.875 V
Lead Channel Pacing Threshold Pulse Width: 0.4 ms
Lead Channel Pacing Threshold Pulse Width: 0.4 ms
Lead Channel Setting Pacing Amplitude: 2 V
Lead Channel Setting Pacing Amplitude: 2.5 V
Lead Channel Setting Pacing Pulse Width: 0.4 ms
Lead Channel Setting Sensing Sensitivity: 5.6 mV

## 2021-05-31 ENCOUNTER — Ambulatory Visit (HOSPITAL_COMMUNITY)
Admission: RE | Admit: 2021-05-31 | Discharge: 2021-05-31 | Disposition: A | Discharge status: Home / Self Care | Payer: Medicare Other | Source: Ambulatory Visit | Attending: Interventional Radiology | Admitting: Interventional Radiology

## 2021-05-31 ENCOUNTER — Other Ambulatory Visit: Payer: Self-pay

## 2021-05-31 DIAGNOSIS — I771 Stricture of artery: Secondary | ICD-10-CM | POA: Diagnosis not present

## 2021-05-31 NOTE — Progress Notes (Signed)
VASCULAR LAB    Carotid duplex has been performed.  See CV proc for preliminary results.   Iyla Balzarini, RVT 05/31/2021, 3:47 PM

## 2021-06-01 DIAGNOSIS — M1712 Unilateral primary osteoarthritis, left knee: Secondary | ICD-10-CM | POA: Diagnosis not present

## 2021-06-01 DIAGNOSIS — Z96652 Presence of left artificial knee joint: Secondary | ICD-10-CM | POA: Diagnosis not present

## 2021-06-01 DIAGNOSIS — R262 Difficulty in walking, not elsewhere classified: Secondary | ICD-10-CM | POA: Diagnosis not present

## 2021-06-01 DIAGNOSIS — M6281 Muscle weakness (generalized): Secondary | ICD-10-CM | POA: Diagnosis not present

## 2021-06-04 ENCOUNTER — Telehealth (HOSPITAL_COMMUNITY): Payer: Self-pay

## 2021-06-04 DIAGNOSIS — M1712 Unilateral primary osteoarthritis, left knee: Secondary | ICD-10-CM | POA: Diagnosis not present

## 2021-06-04 DIAGNOSIS — M6281 Muscle weakness (generalized): Secondary | ICD-10-CM | POA: Diagnosis not present

## 2021-06-04 DIAGNOSIS — Z96652 Presence of left artificial knee joint: Secondary | ICD-10-CM | POA: Diagnosis not present

## 2021-06-04 DIAGNOSIS — R262 Difficulty in walking, not elsewhere classified: Secondary | ICD-10-CM | POA: Diagnosis not present

## 2021-06-04 NOTE — Telephone Encounter (Signed)
Called pt regarding recent imaging, no answer, left vm. AW  

## 2021-06-04 NOTE — Telephone Encounter (Signed)
Pt agreed to f/u in 6 months with us carotid. AW 

## 2021-06-06 DIAGNOSIS — E785 Hyperlipidemia, unspecified: Secondary | ICD-10-CM | POA: Diagnosis not present

## 2021-06-06 DIAGNOSIS — M1712 Unilateral primary osteoarthritis, left knee: Secondary | ICD-10-CM | POA: Diagnosis not present

## 2021-06-06 DIAGNOSIS — M6281 Muscle weakness (generalized): Secondary | ICD-10-CM | POA: Diagnosis not present

## 2021-06-06 DIAGNOSIS — Z96652 Presence of left artificial knee joint: Secondary | ICD-10-CM | POA: Diagnosis not present

## 2021-06-06 DIAGNOSIS — R262 Difficulty in walking, not elsewhere classified: Secondary | ICD-10-CM | POA: Diagnosis not present

## 2021-06-06 DIAGNOSIS — I10 Essential (primary) hypertension: Secondary | ICD-10-CM | POA: Diagnosis not present

## 2021-06-06 DIAGNOSIS — M810 Age-related osteoporosis without current pathological fracture: Secondary | ICD-10-CM | POA: Diagnosis not present

## 2021-06-06 DIAGNOSIS — I4891 Unspecified atrial fibrillation: Secondary | ICD-10-CM | POA: Diagnosis not present

## 2021-06-07 NOTE — Progress Notes (Signed)
Remote pacemaker transmission.   

## 2021-06-08 DIAGNOSIS — M1712 Unilateral primary osteoarthritis, left knee: Secondary | ICD-10-CM | POA: Diagnosis not present

## 2021-06-08 DIAGNOSIS — R262 Difficulty in walking, not elsewhere classified: Secondary | ICD-10-CM | POA: Diagnosis not present

## 2021-06-08 DIAGNOSIS — M6281 Muscle weakness (generalized): Secondary | ICD-10-CM | POA: Diagnosis not present

## 2021-06-08 DIAGNOSIS — Z96652 Presence of left artificial knee joint: Secondary | ICD-10-CM | POA: Diagnosis not present

## 2021-06-11 DIAGNOSIS — M1712 Unilateral primary osteoarthritis, left knee: Secondary | ICD-10-CM | POA: Diagnosis not present

## 2021-06-11 DIAGNOSIS — Z96652 Presence of left artificial knee joint: Secondary | ICD-10-CM | POA: Diagnosis not present

## 2021-06-11 DIAGNOSIS — M6281 Muscle weakness (generalized): Secondary | ICD-10-CM | POA: Diagnosis not present

## 2021-06-11 DIAGNOSIS — R262 Difficulty in walking, not elsewhere classified: Secondary | ICD-10-CM | POA: Diagnosis not present

## 2021-06-20 DIAGNOSIS — M6281 Muscle weakness (generalized): Secondary | ICD-10-CM | POA: Diagnosis not present

## 2021-06-20 DIAGNOSIS — Z96652 Presence of left artificial knee joint: Secondary | ICD-10-CM | POA: Diagnosis not present

## 2021-06-20 DIAGNOSIS — R262 Difficulty in walking, not elsewhere classified: Secondary | ICD-10-CM | POA: Diagnosis not present

## 2021-06-20 DIAGNOSIS — M1712 Unilateral primary osteoarthritis, left knee: Secondary | ICD-10-CM | POA: Diagnosis not present

## 2021-06-22 DIAGNOSIS — M6281 Muscle weakness (generalized): Secondary | ICD-10-CM | POA: Diagnosis not present

## 2021-06-22 DIAGNOSIS — Z96652 Presence of left artificial knee joint: Secondary | ICD-10-CM | POA: Diagnosis not present

## 2021-06-22 DIAGNOSIS — R262 Difficulty in walking, not elsewhere classified: Secondary | ICD-10-CM | POA: Diagnosis not present

## 2021-06-22 DIAGNOSIS — M1712 Unilateral primary osteoarthritis, left knee: Secondary | ICD-10-CM | POA: Diagnosis not present

## 2021-06-25 DIAGNOSIS — R262 Difficulty in walking, not elsewhere classified: Secondary | ICD-10-CM | POA: Diagnosis not present

## 2021-06-25 DIAGNOSIS — Z96652 Presence of left artificial knee joint: Secondary | ICD-10-CM | POA: Diagnosis not present

## 2021-06-25 DIAGNOSIS — M6281 Muscle weakness (generalized): Secondary | ICD-10-CM | POA: Diagnosis not present

## 2021-06-25 DIAGNOSIS — M1712 Unilateral primary osteoarthritis, left knee: Secondary | ICD-10-CM | POA: Diagnosis not present

## 2021-06-27 DIAGNOSIS — R262 Difficulty in walking, not elsewhere classified: Secondary | ICD-10-CM | POA: Diagnosis not present

## 2021-06-27 DIAGNOSIS — M1712 Unilateral primary osteoarthritis, left knee: Secondary | ICD-10-CM | POA: Diagnosis not present

## 2021-06-27 DIAGNOSIS — M6281 Muscle weakness (generalized): Secondary | ICD-10-CM | POA: Diagnosis not present

## 2021-06-27 DIAGNOSIS — M25561 Pain in right knee: Secondary | ICD-10-CM | POA: Diagnosis not present

## 2021-06-27 DIAGNOSIS — Z96652 Presence of left artificial knee joint: Secondary | ICD-10-CM | POA: Diagnosis not present

## 2021-06-29 DIAGNOSIS — R262 Difficulty in walking, not elsewhere classified: Secondary | ICD-10-CM | POA: Diagnosis not present

## 2021-06-29 DIAGNOSIS — M6281 Muscle weakness (generalized): Secondary | ICD-10-CM | POA: Diagnosis not present

## 2021-06-29 DIAGNOSIS — Z96652 Presence of left artificial knee joint: Secondary | ICD-10-CM | POA: Diagnosis not present

## 2021-06-29 DIAGNOSIS — M1712 Unilateral primary osteoarthritis, left knee: Secondary | ICD-10-CM | POA: Diagnosis not present

## 2021-07-02 DIAGNOSIS — M6281 Muscle weakness (generalized): Secondary | ICD-10-CM | POA: Diagnosis not present

## 2021-07-02 DIAGNOSIS — R262 Difficulty in walking, not elsewhere classified: Secondary | ICD-10-CM | POA: Diagnosis not present

## 2021-07-02 DIAGNOSIS — M1712 Unilateral primary osteoarthritis, left knee: Secondary | ICD-10-CM | POA: Diagnosis not present

## 2021-07-02 DIAGNOSIS — Z96652 Presence of left artificial knee joint: Secondary | ICD-10-CM | POA: Diagnosis not present

## 2021-07-04 DIAGNOSIS — M25551 Pain in right hip: Secondary | ICD-10-CM | POA: Diagnosis not present

## 2021-07-04 DIAGNOSIS — M1711 Unilateral primary osteoarthritis, right knee: Secondary | ICD-10-CM | POA: Diagnosis not present

## 2021-07-17 ENCOUNTER — Encounter: Payer: Medicare Other | Admitting: Internal Medicine

## 2021-07-24 ENCOUNTER — Other Ambulatory Visit: Payer: Self-pay | Admitting: Orthopaedic Surgery

## 2021-07-26 ENCOUNTER — Telehealth: Payer: Self-pay | Admitting: Internal Medicine

## 2021-07-26 NOTE — Telephone Encounter (Signed)
° °  Pre-operative Risk Assessment    Patient Name: Brittany [name]  DOB: [birth year] MRN: [id number]     Request for Surgical Clearance    Procedure:   Right knee replaacement  Date of Surgery:  Clearance                                  Surgeon:  Sr Melrose Nakayama Surgeon's Group or Practice Name:   Phone number:  [phone] Fax number:  [phone]   Type of Clearance Requested:   Medical and - Pharmacy:  Hold Apixaban (Eliquis)     Type of Anesthesia:  Spinal   Additional requests/questions:    Signed, Glyn Ade   07/26/2021, 3:52 PM

## 2021-07-26 NOTE — Telephone Encounter (Signed)
° °  Name: Brittany [name]  DOB: [birth year]  MRN: [id number]  Primary Cardiologist: Virl Axe, MD  Chart reviewed as part of pre-operative protocol coverage. Because of Brittany [name]'s past medical history and time since last visit, she will require a follow-up visit in order to better assess preoperative cardiovascular risk.  Given advanced age and Tikosyn rx, recommend OV before clearing as she will be nearing 6 month follow-up. At last OV was also feeling more SOB/fatigue. Would benefit from updated labs/assessment of QTc going into surgery.  Pre-op covering staff: - Please schedule appointment and call patient to inform them. If patient already had an upcoming appointment within acceptable timeframe, please add "pre-op clearance" to the appointment notes so provider is aware. - Please contact requesting surgeon's office via preferred method (i.e, phone, fax) to inform them of need for appointment prior to surgery.  If applicable, this message will also be routed to pharmacy pool and/or primary cardiologist for input on holding anticoagulant/antiplatelet agent as requested below so that this information is available to the clearing provider at time of patient's appointment.  (Will route to pharm)  Charlie Pitter, PA-C  07/26/2021, 8:29 PM

## 2021-07-27 NOTE — Telephone Encounter (Signed)
Patient with diagnosis of afib on Eliquis for anticoagulation.    Procedure: right knee replacement Date of procedure: 08/14/21  CHA2DS2-VASc Score = 8  This indicates a 10.8% annual risk of stroke. The patient's score is based upon: CHF History: 1 HTN History: 1 Diabetes History: 0 Stroke History: 2 Vascular Disease History: 1 Age Score: 2 Gender Score: 1  CrCl >155mL/min due to low SCr (in the 60-70s range if SCr is rounded up to either 0.8 or 1) Platelet count 310K  Pt at elevated risk of stroke off of anticoagulation given elevated CHADS2VASc score. Pt underwent left knee replacement last July with recommendation per Dr Caryl Comes to see if ortho is ok with 2 day Eliquis hold instead of 3. Would see if they are ok with same rec for this knee replacement as well.

## 2021-07-27 NOTE — Telephone Encounter (Signed)
I will send a message to Dr. Caryl Comes scheduler [place] to please schedule the pt for an appt for pre op clearance as well as some SOB/fatigue per Melina Copa, PAC. Will send FYI to requesting office the pt will need an appt for pre op clearance.

## 2021-07-27 NOTE — Telephone Encounter (Signed)
Surgery is set for 08/14/21,

## 2021-07-30 ENCOUNTER — Encounter: Payer: Self-pay | Admitting: Internal Medicine

## 2021-07-30 ENCOUNTER — Other Ambulatory Visit: Payer: Self-pay

## 2021-07-30 ENCOUNTER — Ambulatory Visit (INDEPENDENT_AMBULATORY_CARE_PROVIDER_SITE_OTHER): Payer: Medicare Other | Admitting: Internal Medicine

## 2021-07-30 ENCOUNTER — Ambulatory Visit (INDEPENDENT_AMBULATORY_CARE_PROVIDER_SITE_OTHER): Payer: Medicare Other

## 2021-07-30 VITALS — BP 112/68 | HR 61 | Ht 68.0 in | Wt 210.0 lb

## 2021-07-30 DIAGNOSIS — I495 Sick sinus syndrome: Secondary | ICD-10-CM | POA: Diagnosis not present

## 2021-07-30 DIAGNOSIS — R0602 Shortness of breath: Secondary | ICD-10-CM

## 2021-07-30 DIAGNOSIS — Z950 Presence of cardiac pacemaker: Secondary | ICD-10-CM

## 2021-07-30 DIAGNOSIS — R000 Tachycardia, unspecified: Secondary | ICD-10-CM

## 2021-07-30 DIAGNOSIS — I4891 Unspecified atrial fibrillation: Secondary | ICD-10-CM

## 2021-07-30 NOTE — Progress Notes (Unsigned)
Enrolled for [employer] to mail a ZIO XT long term holter monitor to the patients address on file.  

## 2021-07-30 NOTE — Progress Notes (Signed)
Patient ID: Brittany [name], female   DOB: 1934/03/20, 88 y.o.   MRN: [id number]      Patient Care Team: Donald Prose, MD as PCP - General (Family Medicine) Deboraha Sprang, MD as PCP - Electrophysiology (Cardiology) Deboraha Sprang, MD as PCP - Cardiology (Cardiology) Bjorn Loser, MD as Consulting Physician (Urology) Deboraha Sprang, MD as Consulting Physician (Cardiology) Chesley Mires, MD as Consulting Physician (Pulmonary Disease) Melrose Nakayama, MD as Consulting Physician (Orthopedic Surgery) Antionette Fairy Isaias Cowman, MD as Referring Physician (Ophthalmology) Paulla Dolly Tamala Fothergill, DPM as Consulting Physician (Podiatry)   HPI  Brittany [name] is a 88 y.o. female Seen in followup for  pacer Medtronic implanted 12/12 for sinus node dysfunction.  She has atrial fibrillation for which she previously took sotalol but more recently has been transitioned to dofetilide.  Anticoagulation with apixaban  She will be undergoing a R knee arthroplasty 08/14/21. She had her L knee replaced 02/13/21. She endorses a history of stroke occurring 2 to 3 years ago and was put on Eliquis. During her previous surgery, she stopped the Eliquis 3 days precluding spinal  Of note, she also hope to replace her R hip because of pain and bone degeneration.  She continues to become winded quickly after walking. The breathing issues started after her L knee surgery and has worsened since last year. Her heart rate will fluctuate along with her shortness of breath. She is compliant in taking her metoprolol and diltiazem. She uses a CPAP machine. She notices her blood pressure is lower at night after drinking a glass of wine.    The patient denies chest pain, nocturnal dyspnea, orthopnea or peripheral edema.  There have been no lightheadedness or syncope.  Complains of R knee/hip pain, palpitations, and dyspnea on exertion.   DATE TEST EF    8/12  Myoview  Normal LV No ischemia  6/19 Echo   55-60 %              Date Cr K  Mg Hgb   5/17   1.9   1/18  0.59 4.5   14.7  11/18 0.55 4.4  15.8  3/20 0.61 4.4 2.1 12.9  9/20 0.63 4.9 2.0   8/21    13.0  10/21 0.69 4.7 1.9   7/22 0.64 3.9  14.1  8/22 0.49 4.5 2.0    Thromboembolic risk factors ( age  -2, HTN-1, Gender-1, Vascular disease +/- 1) for a CHADSVASc Score of >=4   Past Medical History:  Diagnosis Date   Ankle fracture 2016   Arthritis    Atrial fibrillation (HCC)    Depression    Dysrhythmia    Elevated parathyroid hormone    Fracture of orbital floor with routine healing    GERD (gastroesophageal reflux disease)    History of colon polyps    benign   Hypercalcemia    Hypertension    Hyponatremia    Hypothyroid    Macular degeneration    wet in the right and dry in the left   OSA on CPAP    Osteoporosis    Peripheral vascular disease ([place])    Presence of permanent cardiac pacemaker    PSVT (paroxysmal supraventricular tachycardia) (HCC)    Rotator cuff tear    Sinus node dysfunction ([place])    a. s/p MDT pacemaker   Stroke [facility])    Urinary incontinence    Wrist fracture 2018    Past Surgical History:  Procedure Laterality Date  BACK SURGERY     CATARACT EXTRACTION     CATARACT EXTRACTION W/ INTRAOCULAR LENS  IMPLANT, BILATERAL Bilateral    COLONOSCOPY     ESOPHAGOGASTRODUODENOSCOPY     IR ANGIO INTRA EXTRACRAN SEL COM CAROTID INNOMINATE BILAT MOD SED  12/30/2017   IR ANGIO VERTEBRAL SEL VERTEBRAL BILAT MOD SED  12/30/2017   LAPAROSCOPIC CHOLECYSTECTOMY  08/11/1999   LUMBAR [place] SURGERY  02/19/2014   ORIF ANKLE FRACTURE Left 04/13/2015   Procedure: OPEN REDUCTION INTERNAL FIXATION (ORIF) LEFT ANKLE FRACTURE;  Surgeon: Meredith Pel, MD;  Location: [facility];  Service: Orthopedics;  Laterality: Left;   PACEMAKER PLACEMENT Right 07/22/2010   a. MDT dual chamber PPM implanted by Dr Caryl Comes    SHOULDER ARTHROSCOPY W/ ROTATOR CUFF REPAIR Right 08/30/2014   WITH MINI-OPEN ROTATOR CUFF REPAIR AND SUBACROMIAL DECOMPRESSION    SHOULDER ARTHROSCOPY WITH ROTATOR CUFF REPAIR AND SUBACROMIAL DECOMPRESSION Right 08/30/2014   Procedure: SHOULDER ARTHROSCOPY WITH MINI-OPEN ROTATOR CUFF REPAIR AND SUBACROMIAL DECOMPRESSION;  Surgeon: Meredith Pel, MD;  Location: [facility];  Service: Orthopedics;  Laterality: Right;  RIGHT SHOULDER DIAGNOSTIC OPERATIVE ARTHROSCOPY, SUBACROMIAL DECOMPRESSION, MINI-OPEN ROTATOR CUFF REPAIR.   TOTAL KNEE ARTHROPLASTY Left 02/13/2021   Procedure: LEFT TOTAL KNEE ARTHROPLASTY;  Surgeon: Melrose Nakayama, MD;  Location: [facility];  Service: Orthopedics;  Laterality: Left;   VAGINAL HYSTERECTOMY  07/22/1968   WRIST FRACTURE SURGERY Left     Current Outpatient Medications  Medication Sig Dispense Refill   acetaminophen (TYLENOL) 500 MG tablet Take 1,000 mg by mouth 2 (two) times daily.     alendronate (FOSAMAX) 70 MG tablet Take 1 tablet (70 mg total) by mouth every 7 (seven) days. Take with a full glass of water on an empty stomach. (Patient taking differently: Take 70 mg by mouth every Wednesday. Take with a full glass of water on an empty stomach.) 4 tablet 11   apixaban (ELIQUIS) 5 MG TABS tablet Take 1 tablet (5 mg total) by mouth 2 (two) times daily. 180 tablet 3   atorvastatin (LIPITOR) 40 MG tablet Take 1 tablet (40 mg total) by mouth daily at 6 PM. 30 tablet 3   B Complex-C (B-COMPLEX WITH VITAMIN C) tablet Take 1 tablet by mouth daily with lunch.      Cholecalciferol (VITAMIN D3) 5000 UNITS CAPS Take 5,000 Units by mouth daily with breakfast.      desmopressin (DDAVP) 0.2 MG tablet Take 0.2 mg by mouth daily as needed (to control bladder on car trip).      dofetilide (TIKOSYN) 500 MCG capsule TAKE ONE CAPSULE BY MOUTH TWICE DAILY 180 capsule 3   lisinopril (ZESTRIL) 40 MG tablet Take 40 mg by mouth daily.     Lutein 20 MG TABS Take 20 mg by mouth daily with breakfast.     magnesium oxide (MAG-OX) 400 MG tablet Take 1 tablet (400 mg total) by mouth daily. 30 tablet 0   Melatonin 10 MG TABS Take  20 mg by mouth at bedtime.     metoprolol succinate (TOPROL-XL) 50 MG 24 hr tablet Take 2 tablets by mouth twice daily with or immediately following meals 360 tablet 3   Multiple Vitamin (MULTIVITAMIN WITH MINERALS) TABS tablet Take 1 tablet by mouth daily with breakfast.      Multiple Vitamins-Minerals (PRESERVISION AREDS 2 PO) Take 1 capsule by mouth 2 (two) times daily with a meal.      PRESCRIPTION MEDICATION Inhale into the lungs at bedtime. CPAP     diltiazem (  CARDIZEM CD) 120 MG 24 hr capsule Take 1 capsule (120 mg total) by mouth in the morning and at bedtime. 180 capsule 2   sertraline (ZOLOFT) 50 MG tablet Take 1 tablet (50 mg total) by mouth daily. 30 tablet 2   No current facility-administered medications for this visit.    Allergies  Allergen Reactions   Adhesive [Tape] Swelling and Rash   Benzalkonium Chloride Rash    Pt was not aware of this allergy   Neosporin [Neomycin-Polymyxin-Gramicidin] Swelling and Rash    Review of Systems negative except from HPI and PMH  Physical Exam: BP 112/68    Pulse 61    Ht 5\' 8"  (1.727 m)    Wt 210 lb (95.3 kg)    LMP 07/22/1968    SpO2 97%    BMI 31.93 kg/m  Well developed and well nourished in no acute distress HENT normal Neck supple with JVP-flat crackles Device pocket well healed; without hematoma or erythema.  There is no tethering  Regular rate and rhythm, no gallop No murmur Abd-soft with active BS No Clubbing cyanosis no edema Skin-warm and dry A & Oriented  Grossly normal sensory and motor function  ECG AV pacing at 61 Intervals 28/11/45  Assessment and  Plan Atrial fibrillation-persistent     Atrial tachycardia   Sinus bradycardia   HFpEF   Pacemaker-Medtronic     High Risk Medication Surveillance -Dofetilide  Hypertension  Hyperlipidemia  Left leg edema  Preop  Significant dyspnea on exertion temporally related to her knee surgery last summer and has been unassociated with edema or abdominal  distention orthopnea.  Her heart rate histograms on her device however are strikingly different with a mean intrinsic rate now in the 90s as opposed to atrial pacing apart from A. Fib.  She is unaware of variability in her dyspnea but it is unlikely I would think that this is simply sinus.  We will use a Zio patch to try to identify abrupt changes in heart rate.  Even here, her ECG was atrial paced and had her device check with E.Watts-RN her heart rate was in the 90s.  She is already taking significant medications for rate control as well as rhythm we will continue the metoprolol 100 twice daily and the diltiazem 120 twice daily as well as her dofetilide 500 twice daily.  Will need TSH and CBC  and BMET for primary causes of tachycardia in the event that it is sinus  Preoperative recommendations would be to minimize her time off of Eliquis 2-3 days as required by orthopedics but the more time that she is off the greater the risk of stroke and so that may preclude spinal anesthesia unless it is essential.  Moreover, I remain concerned as to the cause of her dyspnea which is problematic and progressive since her last surgery.  Pulmonary embolism is unlikely.  But needs to be clarified prior to surgery Brittany [name] as a scribe for Virl Axe, MD.,have documented all relevant documentation on the behalf of Virl Axe, MD,as directed by  Virl Axe, MD while in the presence of Virl Axe, MD.  I, Virl Axe, MD, have reviewed all documentation for this visit. The documentation on 07/30/21 for the exam, diagnosis, procedures, and orders are all accurate and complete.

## 2021-07-30 NOTE — Telephone Encounter (Signed)
Pt is seeing Dr. Caryl Comes today 07/30/21 for pre op clearance. Will forward notes to MD for appt today. Will send FYI to requesting office pt has appt today 07/30/21.

## 2021-07-30 NOTE — Patient Instructions (Addendum)
Medication Instructions:  Your physician recommends that you continue on your current medications as directed. Please refer to the Current Medication list given to you today.  *If you need a refill on your cardiac medications before your next appointment, please call your pharmacy*   Lab Work: CBC, BMET and TSH  If you have labs (blood work) drawn today and your tests are completely normal, you will receive your results only by: [place] (if you have MyChart) OR A paper copy in the mail If you have any lab test that is abnormal or we need to change your treatment, we will call you to review the results.   Testing/Procedures: Your physician has requested that you have an echocardiogram. Echocardiography is a painless test that uses sound waves to create images of your heart. It provides your doctor with information about the size and shape of your heart and how well your hearts chambers and valves are working. This procedure takes approximately one hour. There are no restrictions for this procedure.  ZIO XT- Long Term Monitor Instructions  Your physician has requested you wear a ZIO patch monitor for 7 days.  This is a single patch monitor. [employer] supplies one patch monitor per enrollment. Additional stickers are not available. Please do not apply patch if you will be having a Nuclear Stress Test,  Echocardiogram, Cardiac CT, MRI, or Chest Xray during the period you would be wearing the  monitor. The patch cannot be worn during these tests. You cannot remove and re-apply the  ZIO XT patch monitor.  Your ZIO patch monitor will be mailed 3 day USPS to your address on file. It may take 3-5 days  to receive your monitor after you have been enrolled.  Once you have received your monitor, please review the enclosed instructions. Your monitor  has already been registered assigning a specific monitor serial # to you.  Billing and Patient Assistance Program Information  We have  supplied [employer] with any of your insurance information on file for billing purposes. [employer] offers a sliding scale Patient Assistance Program for patients that do not have  insurance, or whose insurance does not completely cover the cost of the ZIO monitor.  You must apply for the Patient Assistance Program to qualify for this discounted rate.  To apply, please call [employer] at [phone], select option 4, select option 2, ask to apply for  Patient Assistance Program. Theodore Demark will ask your household income, and how many people  are in your household. They will quote your out-of-pocket cost based on that information.  [employer] will also be able to set up a 38-month, interest-free payment plan if needed.  Applying the monitor   Shave hair from upper left chest.  Hold abrader disc by orange tab. Rub abrader in 40 strokes over the upper left chest as  indicated in your monitor instructions.  Clean area with 4 enclosed alcohol pads. Let dry.  Apply patch as indicated in monitor instructions. Patch will be placed under collarbone on left  side of chest with arrow pointing upward.  Rub patch adhesive wings for 2 minutes. Remove white label marked "1". Remove the white  label marked "2". Rub patch adhesive wings for 2 additional minutes.  While looking in a mirror, press and release button in center of patch. A small green light will  flash 3-4 times. This will be your only indicator that the monitor has been turned on.  Do not shower for the first 24 hours. You may  shower after the first 24 hours.  Press the button if you feel a symptom. You will hear a small click. Record Date, Time and  Symptom in the Patient Logbook.  When you are ready to remove the patch, follow instructions on the last 2 pages of Patient  Logbook. Stick patch monitor onto the last page of Patient Logbook.  Place Patient Logbook in the blue and white box. Use locking tab on box and tape box closed  securely. The blue and  white box has prepaid postage on it. Please place it in the mailbox as  soon as possible. Your physician should have your test results approximately 7 days after the  monitor has been mailed back to [facility].  Call [facility] at [phone] if you have questions regarding  your ZIO XT patch monitor. Call them immediately if you see an orange light blinking on your  monitor.  If your monitor falls off in less than 4 days, contact our Monitor department at [phone].  If your monitor becomes loose or falls off after 4 days call [employer] at [phone] for  suggestions on securing your monitor    Follow-Up: At [facility], you and your health needs are our priority.  As part of our continuing mission to provide you with exceptional heart care, we have created designated Provider Care Teams.  These Care Teams include your primary Cardiologist (physician) and Advanced Practice Providers (APPs -  Physician Assistants and Nurse Practitioners) who all work together to provide you with the care you need, when you need it.  We recommend signing up for the patient portal called "MyChart".  Sign up information is provided on this After Visit Summary.  MyChart is used to connect with patients for Virtual Visits (Telemedicine).  Patients are able to view lab/test results, encounter notes, upcoming appointments, etc.  Non-urgent messages can be sent to your provider as well.   To learn more about what you can do with MyChart, go to [email or web address].    Your next appointment:   Follow up with Dr Caryl Comes in 3 months

## 2021-07-31 LAB — CBC
Hematocrit: 39.8 % (ref 34.0–46.6)
Hemoglobin: 13.4 g/dL (ref 11.1–15.9)
MCH: 30.9 pg (ref 26.6–33.0)
MCHC: 33.7 g/dL (ref 31.5–35.7)
MCV: 92 fL (ref 79–97)
Platelets: 329 10*3/uL (ref 150–450)
RBC: 4.33 x10E6/uL (ref 3.77–5.28)
RDW: 13.6 % (ref 11.7–15.4)
WBC: 8.4 10*3/uL (ref 3.4–10.8)

## 2021-07-31 LAB — BASIC METABOLIC PANEL
BUN/Creatinine Ratio: 22 (ref 12–28)
BUN: 16 mg/dL (ref 8–27)
CO2: 25 mmol/L (ref 20–29)
Calcium: 11 mg/dL — ABNORMAL HIGH (ref 8.7–10.3)
Chloride: 97 mmol/L (ref 96–106)
Creatinine, Ser: 0.72 mg/dL (ref 0.57–1.00)
Glucose: 109 mg/dL — ABNORMAL HIGH (ref 70–99)
Potassium: 4.1 mmol/L (ref 3.5–5.2)
Sodium: 137 mmol/L (ref 134–144)
eGFR: 82 mL/min/{1.73_m2} (ref >59)

## 2021-07-31 LAB — TSH: TSH: 3.12 u[IU]/mL (ref 0.450–4.500)

## 2021-08-01 ENCOUNTER — Other Ambulatory Visit: Payer: Self-pay | Admitting: Orthopaedic Surgery

## 2021-08-01 DIAGNOSIS — M1611 Unilateral primary osteoarthritis, right hip: Secondary | ICD-10-CM | POA: Diagnosis not present

## 2021-08-01 DIAGNOSIS — H40013 Open angle with borderline findings, low risk, bilateral: Secondary | ICD-10-CM | POA: Diagnosis not present

## 2021-08-01 DIAGNOSIS — H3589 Other specified retinal disorders: Secondary | ICD-10-CM | POA: Diagnosis not present

## 2021-08-01 DIAGNOSIS — G43109 Migraine with aura, not intractable, without status migrainosus: Secondary | ICD-10-CM | POA: Diagnosis not present

## 2021-08-01 DIAGNOSIS — H353134 Nonexudative age-related macular degeneration, bilateral, advanced atrophic with subfoveal involvement: Secondary | ICD-10-CM | POA: Diagnosis not present

## 2021-08-01 DIAGNOSIS — H04123 Dry eye syndrome of bilateral lacrimal glands: Secondary | ICD-10-CM | POA: Diagnosis not present

## 2021-08-01 DIAGNOSIS — H35363 Drusen (degenerative) of macula, bilateral: Secondary | ICD-10-CM | POA: Diagnosis not present

## 2021-08-01 DIAGNOSIS — Z961 Presence of intraocular lens: Secondary | ICD-10-CM | POA: Diagnosis not present

## 2021-08-01 NOTE — Telephone Encounter (Signed)
Patient wanted to let DR. Caryl Comes know that her surgery will actually be her hip first instead of her knee. The patient wanted to know of that would change her clearance

## 2021-08-02 DIAGNOSIS — R000 Tachycardia, unspecified: Secondary | ICD-10-CM

## 2021-08-02 DIAGNOSIS — I4891 Unspecified atrial fibrillation: Secondary | ICD-10-CM | POA: Diagnosis not present

## 2021-08-02 NOTE — Telephone Encounter (Signed)
Dr. Caryl Comes, please review.  Patient was recently seen on 07/30/2021 for preoperative clearance.  Based on your note, it does not appear patient was fully cleared for surgery as you wanted additional evaluation in regard to her shortness of breath.  You ordered a heart monitor and echocardiogram.  Echocardiogram is due for 1/20 and she says she has not received heart monitor as of today.  Furthermore, she mentioned that the previous knee surgery has been changed to her hip surgery as her orthopedic surgeon felt some of her knee pain could be radiating from the hip instead.

## 2021-08-03 NOTE — Telephone Encounter (Signed)
Isaac Laud thanks   We are awaiting the echo And shelly can you look into getting her the monitor ( or just let us know when she is due to get it Thanks SK

## 2021-08-07 DIAGNOSIS — M546 Pain in thoracic spine: Secondary | ICD-10-CM | POA: Diagnosis not present

## 2021-08-07 DIAGNOSIS — M9903 Segmental and somatic dysfunction of lumbar region: Secondary | ICD-10-CM | POA: Diagnosis not present

## 2021-08-07 DIAGNOSIS — M9902 Segmental and somatic dysfunction of thoracic region: Secondary | ICD-10-CM | POA: Diagnosis not present

## 2021-08-07 DIAGNOSIS — M9950 Intervertebral disc stenosis of neural canal of head region: Secondary | ICD-10-CM | POA: Diagnosis not present

## 2021-08-07 NOTE — Care Plan (Signed)
Ortho Bundle Case Management Note  Patient Details  Name: Brittany [name] MRN: [id number] Date of Birth: May 25, 1934    Spoke with patient prior to surgery. She will return to her independent apartment at [place] with her husband. She has equipment there. OPPT will be provided on site by [facility]. Patient and MD in agreement with plan. Choice offered                  DME Arranged:    DME Agency:     HH Arranged:    HH Agency:     Additional Comments: Please contact me with any questions of if this plan should need to change.  Ladell Heads,  [place] [facility]ist  [phone] 08/07/2021, 2:56 PM

## 2021-08-08 ENCOUNTER — Encounter: Payer: Self-pay | Admitting: Internal Medicine

## 2021-08-08 NOTE — Progress Notes (Signed)
PERIOPERATIVE PRESCRIPTION FOR IMPLANTED CARDIAC DEVICE PR/OGRAMMING  Patient Information: Name:  Brittany [name]  DOB:  07-Jun-1934  MRN:  [id number]    Joline Maxcy, RN  P Cv Div Heartcare Device Planned Procedure:  Right Total hip  Surgeon:  Dr. Melrose Nakayama  Date of Procedure:  08/14/2021  Cautery will be used.  Position during surgery:     Please send documentation back to:  [facility] (Fax # [phone])   Joline Maxcy, RN  08/07/2021 4:52 PM  Device Information:  Clinic EP Physician:  Virl Axe, MD   Device Type:  Pacemaker Manufacturer and Phone #:  Medtronic: [phone] Pacemaker Dependent?:  Yes.   Date of Last Device Check:  07/30/21 Normal Device Function?:  Yes.    Electrophysiologist's Recommendations:  Have magnet available. Provide continuous ECG monitoring when magnet is used or reprogramming is to be performed.  Procedure may interfere with device function.  Magnet should be placed over device during procedure.  Per Device Clinic [address], [place] Ram, RN  9:39 AM 08/08/2021

## 2021-08-08 NOTE — H&P (Signed)
TOTAL HIP ADMISSION H&P  Patient is admitted for right total hip arthroplasty.  Subjective:  Chief Complaint: right hip pain  HPI: Brittany [name], 88 y.o. female, has a history of pain and functional disability in the right hip(s) due to arthritis and patient has failed non-surgical conservative treatments for greater than 12 weeks to include NSAID's and/or analgesics, corticosteriod injections, flexibility and strengthening excercises, supervised PT with diminished ADL's post treatment, use of assistive devices, weight reduction as appropriate, and activity modification.  Onset of symptoms was gradual starting 5 years ago with gradually worsening course since that time.The patient noted no past surgery on the right hip(s).  Patient currently rates pain in the right hip at 10 out of 10 with activity. Patient has night pain, worsening of pain with activity and weight bearing, trendelenberg gait, pain that interfers with activities of daily living, and crepitus. Patient has evidence of subchondral cysts, subchondral sclerosis, periarticular osteophytes, and joint space narrowing by imaging studies. This condition presents safety issues increasing the risk of falls. There is no current active infection.  Patient Active Problem List   Diagnosis Date Noted   Unilateral primary osteoarthritis, left knee 02/13/2021   Obesity (BMI 30.0-34.9) 06/21/2020   Sinus bradycardia 04/12/2020   (HFpEF) heart failure with preserved ejection fraction ([place]) 04/12/2020   Persistent atrial fibrillation ([place]) 03/22/2019   A-fib ([place]) 03/22/2019   Stroke (cerebrum) ([place]) 12/30/2017   Dizziness 06/06/2017   Overactive bladder 06/06/2017   Abnormal mammogram 05/01/2017   Hyperparathyroidism ([place]) 02/12/2017   Vitamin D deficiency 08/14/2016   Osteoporosis 08/14/2016   Hyperlipidemia 08/14/2016   Atherosclerosis of coronary artery 01/02/2016   Hypercalcemia 03/29/2015   Multiple pulmonary nodules 12/30/2013    Lumbar spinal stenosis 12/22/2013   Arthropathy of facet joint 10/08/2013   Atrial fibrillation ([place]) 03/04/2013   Sinus node dysfunction ([place]) 03/04/2013   Pacemaker-Medtronic 03/04/2013   OSA on CPAP 02/23/2013   Anxiety and depression 02/23/2013   Chronic gastritis 02/23/2013   Essential hypertension, benign 02/23/2013   Urinary incontinence 02/23/2013   Past Medical History:  Diagnosis Date   Ankle fracture 2016   Arthritis    Atrial fibrillation (HCC)    Depression    Dysrhythmia    Elevated parathyroid hormone    Fracture of orbital floor with routine healing    GERD (gastroesophageal reflux disease)    History of colon polyps    benign   Hypercalcemia    Hypertension    Hyponatremia    Hypothyroid    Macular degeneration    wet in the right and dry in the left   OSA on CPAP    Osteoporosis    Peripheral vascular disease (HCC)    Presence of permanent cardiac pacemaker    PSVT (paroxysmal supraventricular tachycardia) (HCC)    Rotator cuff tear    Sinus node dysfunction (HCC)    a. s/p MDT pacemaker   Stroke [facility])    Urinary incontinence    Wrist fracture 2018    Past Surgical History:  Procedure Laterality Date   BACK SURGERY     CATARACT EXTRACTION     CATARACT EXTRACTION W/ INTRAOCULAR LENS  IMPLANT, BILATERAL Bilateral    COLONOSCOPY     ESOPHAGOGASTRODUODENOSCOPY     IR ANGIO INTRA EXTRACRAN SEL COM CAROTID INNOMINATE BILAT MOD SED  12/30/2017   IR ANGIO VERTEBRAL SEL VERTEBRAL BILAT MOD SED  12/30/2017   LAPAROSCOPIC CHOLECYSTECTOMY  08/11/1999   LUMBAR [place] SURGERY  02/19/2014  ORIF ANKLE FRACTURE Left 04/13/2015   Procedure: OPEN REDUCTION INTERNAL FIXATION (ORIF) LEFT ANKLE FRACTURE;  Surgeon: Meredith Pel, MD;  Location: [place];  Service: Orthopedics;  Laterality: Left;   PACEMAKER PLACEMENT Right 07/22/2010   a. MDT dual chamber PPM implanted by Dr Caryl Comes    SHOULDER ARTHROSCOPY W/ ROTATOR CUFF REPAIR Right 08/30/2014   WITH MINI-OPEN  ROTATOR CUFF REPAIR AND SUBACROMIAL DECOMPRESSION   SHOULDER ARTHROSCOPY WITH ROTATOR CUFF REPAIR AND SUBACROMIAL DECOMPRESSION Right 08/30/2014   Procedure: SHOULDER ARTHROSCOPY WITH MINI-OPEN ROTATOR CUFF REPAIR AND SUBACROMIAL DECOMPRESSION;  Surgeon: Meredith Pel, MD;  Location: [facility];  Service: Orthopedics;  Laterality: Right;  RIGHT SHOULDER DIAGNOSTIC OPERATIVE ARTHROSCOPY, SUBACROMIAL DECOMPRESSION, MINI-OPEN ROTATOR CUFF REPAIR.   TOTAL KNEE ARTHROPLASTY Left 02/13/2021   Procedure: LEFT TOTAL KNEE ARTHROPLASTY;  Surgeon: Melrose Nakayama, MD;  Location: [facility];  Service: Orthopedics;  Laterality: Left;   VAGINAL HYSTERECTOMY  07/22/1968   WRIST FRACTURE SURGERY Left     No current facility-administered medications for this encounter.   Current Outpatient Medications  Medication Sig Dispense Refill Last Dose   acetaminophen (TYLENOL) 500 MG tablet Take 1,000 mg by mouth every 6 (six) hours as needed for moderate pain.      alendronate (FOSAMAX) 70 MG tablet Take 1 tablet (70 mg total) by mouth every 7 (seven) days. Take with a full glass of water on an empty stomach. 4 tablet 11    apixaban (ELIQUIS) 5 MG TABS tablet Take 1 tablet (5 mg total) by mouth 2 (two) times daily. 180 tablet 3    atorvastatin (LIPITOR) 40 MG tablet Take 1 tablet (40 mg total) by mouth daily at 6 PM. 30 tablet 3    B Complex-C (B-COMPLEX WITH VITAMIN C) tablet Take 1 tablet by mouth daily with lunch.       Cholecalciferol (VITAMIN D3) 5000 UNITS CAPS Take 5,000 Units by mouth daily with breakfast.       desmopressin (DDAVP) 0.2 MG tablet Take 0.2 mg by mouth daily as needed (to control bladder on car trip).       diltiazem (CARDIZEM CD) 120 MG 24 hr capsule Take 1 capsule (120 mg total) by mouth in the morning and at bedtime. 180 capsule 2    dofetilide (TIKOSYN) 500 MCG capsule TAKE ONE CAPSULE BY MOUTH TWICE DAILY 180 capsule 3    lisinopril (ZESTRIL) 40 MG tablet Take 40 mg by mouth daily.      Lutein 20  MG TABS Take 20 mg by mouth daily with breakfast.      magnesium oxide (MAG-OX) 400 MG tablet Take 1 tablet (400 mg total) by mouth daily. 30 tablet 0    Melatonin 10 MG TABS Take 10-40 mg by mouth at bedtime as needed (sleep).      metoprolol succinate (TOPROL-XL) 50 MG 24 hr tablet Take 2 tablets by mouth twice daily with or immediately following meals (Patient taking differently: Take 100 mg by mouth in the morning and at bedtime.) 360 tablet 3    Multiple Vitamin (MULTIVITAMIN WITH MINERALS) TABS tablet Take 1 tablet by mouth daily with breakfast.       Multiple Vitamins-Minerals (PRESERVISION AREDS 2 PO) Take 1 capsule by mouth 2 (two) times daily with a meal.       sertraline (ZOLOFT) 50 MG tablet Take 1 tablet (50 mg total) by mouth daily. 30 tablet 2    PRESCRIPTION MEDICATION Inhale into the lungs at bedtime. CPAP  Allergies  Allergen Reactions   Adhesive [Tape] Swelling and Rash   Benzalkonium Chloride Rash    Pt was not aware of this allergy   Neosporin [Neomycin-Polymyxin-Gramicidin] Swelling and Rash    Social History   Tobacco Use   Smoking status: Former    Packs/day: 1.00    Years: 45.00    Pack years: 45.00    Types: Cigarettes    Quit date: 1992    Years since quitting: 31.0   Smokeless tobacco: Never   Tobacco comments:    quit smoking in 1992  Substance Use Topics   Alcohol use: Yes    Alcohol/week: 28.0 standard drinks    Types: 28 Glasses of wine per week    Comment: "16oz wine q night" every night    Family History  Problem Relation Age of Onset   Colon cancer Mother        colon   Cancer Father        unknown type   Stroke Sister    Skin cancer Brother    Heart disease Brother    Breast cancer Paternal Aunt    Asthma Brother      Review of Systems  Musculoskeletal:  Positive for arthralgias.       Right hip  All other systems reviewed and are negative.  Objective:  Physical Exam Constitutional:      Appearance: Normal appearance.   HENT:     Head: Normocephalic and atraumatic.     Mouth/Throat:     Pharynx: Oropharynx is clear.  Eyes:     Extraocular Movements: Extraocular movements intact.  Cardiovascular:     Rate and Rhythm: Normal rate.  Pulmonary:     Effort: Pulmonary effort is normal.  Abdominal:     Palpations: Abdomen is soft.  Musculoskeletal:     Cervical back: Normal range of motion.     Comments: Examination of the right hip shows significantly limited motion with pain on internal rotation.  No real tenderness palpation throughout.  Right knee motion remains for about 5-100 with a mild valgus deformity.  She has 1+ crepitation but not much tenderness palpation on the joint line.  Her calf is soft and nontender.  She is neurovascularly intact distally.  Skin:    General: Skin is warm and dry.  Neurological:     General: No focal deficit present.     Mental Status: She is alert and oriented to person, place, and time. Mental status is at baseline.  Psychiatric:        Mood and Affect: Mood normal.        Behavior: Behavior normal.        Thought Content: Thought content normal.        Judgment: Judgment normal.    Vital signs in last 24 hours:    Labs:   Estimated body mass index is 31.93 kg/m as calculated from the following:   Height as of 07/30/21: 5\' 8"  (1.727 m).   Weight as of 07/30/21: 95.3 kg.   Imaging Review Plain radiographs demonstrate severe degenerative joint disease of the right hip(s). The bone quality appears to be good for age and reported activity level.      Assessment/Plan:  End stage primary arthritis, right hip(s)  The patient history, physical examination, clinical judgement of the provider and imaging studies are consistent with end stage degenerative joint disease of the right hip(s) and total hip arthroplasty is deemed medically necessary. The treatment options including medical  management, injection therapy, arthroscopy and arthroplasty were discussed at  length. The risks and benefits of total hip arthroplasty were presented and reviewed. The risks due to aseptic loosening, infection, stiffness, dislocation/subluxation,  thromboembolic complications and other imponderables were discussed.  The patient acknowledged the explanation, agreed to proceed with the plan and consent was signed. Patient is being admitted for inpatient treatment for surgery, pain control, PT, OT, prophylactic antibiotics, VTE prophylaxis, progressive ambulation and ADL's and discharge planning.The patient is planning to be discharged home with home health services

## 2021-08-09 ENCOUNTER — Encounter (HOSPITAL_COMMUNITY): Payer: Self-pay

## 2021-08-09 ENCOUNTER — Other Ambulatory Visit: Payer: Self-pay

## 2021-08-09 ENCOUNTER — Encounter (HOSPITAL_COMMUNITY)
Admission: RE | Admit: 2021-08-09 | Discharge: 2021-08-09 | Disposition: A | Discharge status: Home / Self Care | Payer: Medicare Other | Source: Ambulatory Visit | Attending: Orthopaedic Surgery | Admitting: Orthopaedic Surgery

## 2021-08-09 VITALS — BP 145/78 | HR 86 | Temp 99.1°F | Ht 68.0 in | Wt 209.0 lb

## 2021-08-09 DIAGNOSIS — Z950 Presence of cardiac pacemaker: Secondary | ICD-10-CM | POA: Diagnosis not present

## 2021-08-09 DIAGNOSIS — Z01812 Encounter for preprocedural laboratory examination: Secondary | ICD-10-CM | POA: Insufficient documentation

## 2021-08-09 DIAGNOSIS — Z87891 Personal history of nicotine dependence: Secondary | ICD-10-CM | POA: Diagnosis not present

## 2021-08-09 DIAGNOSIS — M1611 Unilateral primary osteoarthritis, right hip: Secondary | ICD-10-CM | POA: Insufficient documentation

## 2021-08-09 DIAGNOSIS — Z01818 Encounter for other preprocedural examination: Secondary | ICD-10-CM

## 2021-08-09 DIAGNOSIS — I495 Sick sinus syndrome: Secondary | ICD-10-CM | POA: Insufficient documentation

## 2021-08-09 DIAGNOSIS — I10 Essential (primary) hypertension: Secondary | ICD-10-CM | POA: Insufficient documentation

## 2021-08-09 DIAGNOSIS — Z9989 Dependence on other enabling machines and devices: Secondary | ICD-10-CM | POA: Insufficient documentation

## 2021-08-09 DIAGNOSIS — I071 Rheumatic tricuspid insufficiency: Secondary | ICD-10-CM | POA: Diagnosis not present

## 2021-08-09 DIAGNOSIS — G4733 Obstructive sleep apnea (adult) (pediatric): Secondary | ICD-10-CM | POA: Insufficient documentation

## 2021-08-09 DIAGNOSIS — Z8673 Personal history of transient ischemic attack (TIA), and cerebral infarction without residual deficits: Secondary | ICD-10-CM | POA: Diagnosis not present

## 2021-08-09 DIAGNOSIS — K219 Gastro-esophageal reflux disease without esophagitis: Secondary | ICD-10-CM | POA: Insufficient documentation

## 2021-08-09 LAB — SURGICAL PCR SCREEN
MRSA, PCR: NEGATIVE
Staphylococcus aureus: NEGATIVE

## 2021-08-09 NOTE — Progress Notes (Signed)
COVID Vaccine Completed: Yes Date COVID Vaccine completed: 04/12/21 x 4 COVID vaccine manufacturer:     Moderna     COVID Test: N/A PCP - Dr. Jannifer Franklin Cardiologist - Dr. Jolyn Nap  Chest x-ray - 02/09/21 EPIC EKG - 07/30/21 EPIC Stress Test -  ECHO - 07/30/20 Cardiac Cath -  Pacemaker/ICD device last checked: 07/30/21  Sleep Study - Yes CPAP - Yes  Fasting Blood Sugar -  Checks Blood Sugar _____ times a day  Blood Thinner Instructions:Eliquis on hold since 08/09/21 as per Dr. Rhona Raider instructions. Aspirin Instructions: Last Dose:  Anesthesia review: Hx: Afib,OSA(CPAP),HTN,Stroke  Patient denies shortness of breath, fever, cough and chest pain at PAT appointment   Patient verbalized understanding of instructions that were given to them at the PAT appointment. Patient was also instructed that they will need to review over the PAT instructions again at home before surgery.

## 2021-08-09 NOTE — Patient Instructions (Signed)
DUE TO COVID-19 ONLY ONE VISITOR IS ALLOWED TO COME WITH YOU AND STAY IN THE WAITING ROOM ONLY DURING PRE OP AND PROCEDURE.   **NO VISITORS ARE ALLOWED IN THE SHORT STAY AREA OR RECOVERY ROOM!!**  IF YOU WILL BE ADMITTED INTO THE HOSPITAL YOU ARE ALLOWED ONLY TWO SUPPORT PEOPLE DURING VISITATION HOURS ONLY (7 AM -8PM)   The support person(s) must pass our screening, gel in and out, and wear a mask at all times, including in the patients room. Patients must also wear a mask when staff or their support person are in the room. Visitors GUEST BADGE MUST BE WORN VISIBLY  One adult visitor may remain with you overnight and MUST be in the room by 8 P.M.  No visitors under the age of 38. Any visitor under the age of 64 must be accompanied by an adult.        Your procedure is scheduled on: 08/14/21   Report to [facility] Main Entrance    Report to short stay at : 5:15 AM   Call this number if you have problems the morning of surgery [phone]   Do not eat food :After Midnight.   May have liquids until : 4:30 AM   day of surgery  CLEAR LIQUID DIET  Foods Allowed                                                                     Foods Excluded  Water, Black Coffee and tea, regular and decaf                             liquids that you cannot  Plain Jell-O in any flavor  (No red)                                           see through such as: Fruit ices (not with fruit pulp)                                     milk, soups, orange juice              Iced Popsicles (No red)                                    All solid food                                   Apple juices Sports drinks like Gatorade (No red) Lightly seasoned clear broth or consume(fat free) Sugar Sample Menu Breakfast                                Lunch  Supper Cranberry juice                    Beef broth                            Chicken broth Jell-O                                      Grape juice                           Apple juice Coffee or tea                        Jell-O                                      Popsicle                                                Coffee or tea                        Coffee or tea    Complete one Ensure drink the morning of surgery 3 hours prior to scheduled surgery at: 4:30 AM.    The day of surgery:  Drink ONE (1) Pre-Surgery Clear Ensure or G2 at AM the morning of surgery. Drink in one sitting. Do not sip.  This drink was given to you during your hospital  pre-op appointment visit. Nothing else to drink after completing the  Pre-Surgery Clear Ensure or G2.          If you have questions, please contact your surgeons office.     Oral Hygiene is also important to reduce your risk of infection.                                    Remember - BRUSH YOUR TEETH THE MORNING OF SURGERY WITH YOUR REGULAR TOOTHPASTE   Do NOT smoke after Midnight   Take these medicines the morning of surgery with A SIP OF WATER:Tikosyn,sertraline,metoprolol,diltiazem.Desmopressin as needed.   DO NOT TAKE ANY ORAL DIABETIC MEDICATIONS DAY OF YOUR SURGERY                              You may not have any metal on your body including hair pins, jewelry, and body piercing             Do not wear make-up, lotions, powders, perfumes/cologne, or deodorant  Do not wear nail polish including gel and S&S, artificial/acrylic nails, or any other type of covering on natural nails including finger and toenails. If you have artificial nails, gel coating, etc. that needs to be removed by a nail salon please have this removed prior to surgery or surgery may need to be canceled/ delayed if the surgeon/ anesthesia feels like they are unable to be safely monitored.   Do not shave  48 hours prior  to surgery.    Do not bring valuables to the hospital. [place].   Contacts, dentures or bridgework may not be worn  into surgery.   Bring small overnight bag day of surgery.    Patients discharged on the day of surgery will not be allowed to drive home.  Someone needs to stay with you for the first 24 hours after anesthesia.   Special Instructions: Bring a copy of your healthcare power of attorney and living will documents         the day of surgery if you haven't scanned them before.              Please read over the following fact sheets you were given: IF YOU HAVE QUESTIONS ABOUT YOUR PRE-OP INSTRUCTIONS PLEASE CALL [phone]     [facility] - Preparing for Surgery Before surgery, you can play an important role.  Because skin is not sterile, your skin needs to be as free of germs as possible.  You can reduce the number of germs on your skin by washing with CHG (chlorahexidine gluconate) soap before surgery.  CHG is an antiseptic cleaner which kills germs and bonds with the skin to continue killing germs even after washing. Please DO NOT use if you have an allergy to CHG or antibacterial soaps.  If your skin becomes reddened/irritated stop using the CHG and inform your nurse when you arrive at Short Stay. Do not shave (including legs and underarms) for at least 48 hours prior to the first CHG shower.  You may shave your face/neck. Please follow these instructions carefully:  1.  Shower with CHG Soap the night before surgery and the  morning of Surgery.  2.  If you choose to wash your hair, wash your hair first as usual with your  normal  shampoo.  3.  After you shampoo, rinse your hair and body thoroughly to remove the  shampoo.                           4.  Use CHG as you would any other liquid soap.  You can apply chg directly  to the skin and wash                       Gently with a scrungie or clean washcloth.  5.  Apply the CHG Soap to your body ONLY FROM THE NECK DOWN.   Do not use on face/ open                           Wound or open sores. Avoid contact with eyes, ears mouth and genitals (private  parts).                       Wash face,  Genitals (private parts) with your normal soap.             6.  Wash thoroughly, paying special attention to the area where your surgery  will be performed.  7.  Thoroughly rinse your body with warm water from the neck down.  8.  DO NOT shower/wash with your normal soap after using and rinsing off  the CHG Soap.  9.  Pat yourself dry with a clean towel.            10.  Wear clean pajamas.            11.  Place clean sheets on your bed the night of your first shower and do not  sleep with pets. Day of Surgery : Do not apply any lotions/deodorants the morning of surgery.  Please wear clean clothes to the hospital/surgery center.  FAILURE TO FOLLOW THESE INSTRUCTIONS MAY RESULT IN THE CANCELLATION OF YOUR SURGERY PATIENT SIGNATURE_________________________________  NURSE SIGNATURE__________________________________  ________________________________________________________________________   Adam Phenix  An incentive spirometer is a tool that can help keep your lungs clear and active. This tool measures how well you are filling your lungs with each breath. Taking long deep breaths may help reverse or decrease the chance of developing breathing (pulmonary) problems (especially infection) following: A long period of time when you are unable to move or be active. BEFORE THE PROCEDURE  If the spirometer includes an indicator to show your best effort, your nurse or respiratory therapist will set it to a desired goal. If possible, sit up straight or lean slightly forward. Try not to slouch. Hold the incentive spirometer in an upright position. INSTRUCTIONS FOR USE  Sit on the edge of your bed if possible, or sit up as far as you can in bed or on a chair. Hold the incentive spirometer in an upright position. Breathe out normally. Place the mouthpiece in your mouth and seal your lips tightly around it. Breathe in slowly and as deeply as  possible, raising the piston or the ball toward the top of the column. Hold your breath for 3-5 seconds or for as long as possible. Allow the piston or ball to fall to the bottom of the column. Remove the mouthpiece from your mouth and breathe out normally. Rest for a few seconds and repeat Steps 1 through 7 at least 10 times every 1-2 hours when you are awake. Take your time and take a few normal breaths between deep breaths. The spirometer may include an indicator to show your best effort. Use the indicator as a goal to work toward during each repetition. After each set of 10 deep breaths, practice coughing to be sure your lungs are clear. If you have an incision (the cut made at the time of surgery), support your incision when coughing by placing a pillow or rolled up towels firmly against it. Once you are able to get out of bed, walk around indoors and cough well. You may stop using the incentive spirometer when instructed by your caregiver.  RISKS AND COMPLICATIONS Take your time so you do not get dizzy or light-headed. If you are in pain, you may need to take or ask for pain medication before doing incentive spirometry. It is harder to take a deep breath if you are having pain. AFTER USE Rest and breathe slowly and easily. It can be helpful to keep track of a log of your progress. Your caregiver can provide you with a simple table to help with this. If you are using the spirometer at home, follow these instructions: [place] IF:  You are having difficultly using the spirometer. You have trouble using the spirometer as often as instructed. Your pain medication is not giving enough relief while using the spirometer. You develop fever of 100.5 F (38.1 C) or higher. SEEK IMMEDIATE MEDICAL CARE IF:  You cough up bloody sputum that had not  been present before. You develop fever of 102 F (38.9 C) or greater. You develop worsening pain at or near the incision site. MAKE SURE YOU:   Understand these instructions. Will watch your condition. Will get help right away if you are not doing well or get worse. Document Released: 11/18/2006 Document Revised: 09/30/2011 Document Reviewed: 01/19/2007 [facility] Patient Information 2014 [place], [place].   ________________________________________________________________________

## 2021-08-10 ENCOUNTER — Ambulatory Visit (HOSPITAL_COMMUNITY): Payer: Medicare Other | Attending: Cardiovascular Disease

## 2021-08-10 DIAGNOSIS — R000 Tachycardia, unspecified: Secondary | ICD-10-CM | POA: Diagnosis not present

## 2021-08-10 DIAGNOSIS — R0602 Shortness of breath: Secondary | ICD-10-CM | POA: Insufficient documentation

## 2021-08-10 DIAGNOSIS — I4891 Unspecified atrial fibrillation: Secondary | ICD-10-CM | POA: Insufficient documentation

## 2021-08-10 LAB — ECHOCARDIOGRAM COMPLETE
P 1/2 time: 513 msec
S' Lateral: 3.1 cm

## 2021-08-13 DIAGNOSIS — M9950 Intervertebral disc stenosis of neural canal of head region: Secondary | ICD-10-CM | POA: Diagnosis not present

## 2021-08-13 DIAGNOSIS — M9903 Segmental and somatic dysfunction of lumbar region: Secondary | ICD-10-CM | POA: Diagnosis not present

## 2021-08-13 DIAGNOSIS — M9902 Segmental and somatic dysfunction of thoracic region: Secondary | ICD-10-CM | POA: Diagnosis not present

## 2021-08-13 DIAGNOSIS — M546 Pain in thoracic spine: Secondary | ICD-10-CM | POA: Diagnosis not present

## 2021-08-13 NOTE — Progress Notes (Signed)
Anesthesia Chart Review   Case: [id number] Date/Time: 08/14/21 0715   Procedure: TOTAL HIP ARTHROPLASTY ANTERIOR APPROACH (Right: Hip)   Anesthesia type: Spinal   Pre-op diagnosis: RIGHT HIP DEGENERATIVE JOINT DISEASE   Location: [facility] ROOM 06 / [facility]   Surgeons: Melrose Nakayama, MD       DISCUSSION:88 y.o. former smoker with h/o GERD, HTN, OSA on CPAP, stroke, sinus node dysfunction with pacemaker in place (device orders in 08/08/2021 progress note), right hip djd scheduled for above procedure 08/14/2021 with Dr. Melrose Nakayama.   Pt last seen by cardiology 07/30/2021. Per OV note, "Preoperative recommendations would be to minimize her time off of Eliquis 2-3 days as required by orthopedics but the more time that she is off the greater the risk of stroke and so that may preclude spinal anesthesia unless it is essential.   Moreover, I remain concerned as to the cause of her dyspnea which is problematic and progressive since her last surgery.  Pulmonary embolism is unlikely.  But needs to be clarified prior to surgery"  Echo 08/10/21 with EF 55-60%, tricuspid valve regurgitation mild to moderate.   Discussed with Dr. Caryl Comes, pt can proceed with acceptable risk.   Anticipate pt can proceed with planned procedure barring acute status change.   VS: BP (!) 145/78    Pulse 86    Temp 37.3 C (Oral)    Ht 5\' 8"  (1.727 m)    Wt 94.8 kg    LMP 07/22/1968    SpO2 98%    BMI 31.78 kg/m   PROVIDERS: Donald Prose, MD  Virl Axe, MD is Cardiologist  LABS: Labs reviewed: Acceptable for surgery. (all labs ordered are listed, but only abnormal results are displayed)  Labs Reviewed  SURGICAL PCR SCREEN  TYPE AND SCREEN     IMAGES:   EKG: 07/30/2021 Rate 61 bpm    CV: Echo 08/10/2021 1. Left ventricular ejection fraction, by estimation, is 55 to 60%. The  left ventricle has normal function. The left ventricle has no regional  wall motion abnormalities. There is mild asymmetric left ventricular   hypertrophy of the basal and septal  segments. Left ventricular diastolic parameters are consistent with Grade  I diastolic dysfunction (impaired relaxation). The average left  ventricular global longitudinal strain is -20.1 %. The global longitudinal  strain is normal.   2. Pacing wires in RA/RV . Right ventricular systolic function is normal.  The right ventricular size is normal. There is moderately elevated  pulmonary artery systolic pressure.   3. Left atrial size was moderately dilated.   4. Right atrial size was moderately dilated.   5. The mitral valve is degenerative. Mild to moderate mitral valve  regurgitation. No evidence of mitral stenosis.   6. Tricuspid valve regurgitation is mild to moderate.   7. The aortic valve is tricuspid. There is moderate calcification of the  aortic valve. There is moderate thickening of the aortic valve. Aortic  valve regurgitation is mild. Aortic valve sclerosis/calcification is  present, without any evidence of aortic  stenosis.   8. Aortic dilatation noted. There is mild dilatation of the ascending  aorta, measuring 38 mm.   9. The inferior vena cava is normal in size with greater than 50%  respiratory variability, suggesting right atrial pressure of 3 mmHg. Past Medical History:  Diagnosis Date   Ankle fracture 2016   Arthritis    Atrial fibrillation (HCC)    Depression    Dysrhythmia    Elevated parathyroid hormone  Fracture of orbital floor with routine healing    GERD (gastroesophageal reflux disease)    History of colon polyps    benign   Hypercalcemia    Hypertension    Hyponatremia    Hypothyroid    Macular degeneration    wet in the right and dry in the left   OSA on CPAP    Osteoporosis    Peripheral vascular disease (HCC)    Presence of permanent cardiac pacemaker    PSVT (paroxysmal supraventricular tachycardia) (HCC)    Rotator cuff tear    Sinus node dysfunction (HCC)    a. s/p MDT pacemaker   Stroke [facility])     Urinary incontinence    Wrist fracture 2018    Past Surgical History:  Procedure Laterality Date   BACK SURGERY     CATARACT EXTRACTION     CATARACT EXTRACTION W/ INTRAOCULAR LENS  IMPLANT, BILATERAL Bilateral    COLONOSCOPY     ESOPHAGOGASTRODUODENOSCOPY     IR ANGIO INTRA EXTRACRAN SEL COM CAROTID INNOMINATE BILAT MOD SED  12/30/2017   IR ANGIO VERTEBRAL SEL VERTEBRAL BILAT MOD SED  12/30/2017   LAPAROSCOPIC CHOLECYSTECTOMY  08/11/1999   LUMBAR [place] SURGERY  02/19/2014   ORIF ANKLE FRACTURE Left 04/13/2015   Procedure: OPEN REDUCTION INTERNAL FIXATION (ORIF) LEFT ANKLE FRACTURE;  Surgeon: Meredith Pel, MD;  Location: [place];  Service: Orthopedics;  Laterality: Left;   PACEMAKER PLACEMENT Right 07/22/2010   a. MDT dual chamber PPM implanted by Dr Caryl Comes    SHOULDER ARTHROSCOPY W/ ROTATOR CUFF REPAIR Right 08/30/2014   WITH MINI-OPEN ROTATOR CUFF REPAIR AND SUBACROMIAL DECOMPRESSION   SHOULDER ARTHROSCOPY WITH ROTATOR CUFF REPAIR AND SUBACROMIAL DECOMPRESSION Right 08/30/2014   Procedure: SHOULDER ARTHROSCOPY WITH MINI-OPEN ROTATOR CUFF REPAIR AND SUBACROMIAL DECOMPRESSION;  Surgeon: Meredith Pel, MD;  Location: [place];  Service: Orthopedics;  Laterality: Right;  RIGHT SHOULDER DIAGNOSTIC OPERATIVE ARTHROSCOPY, SUBACROMIAL DECOMPRESSION, MINI-OPEN ROTATOR CUFF REPAIR.   TOTAL KNEE ARTHROPLASTY Left 02/13/2021   Procedure: LEFT TOTAL KNEE ARTHROPLASTY;  Surgeon: Melrose Nakayama, MD;  Location: [facility];  Service: Orthopedics;  Laterality: Left;   VAGINAL HYSTERECTOMY  07/22/1968   WRIST FRACTURE SURGERY Left     MEDICATIONS:  acetaminophen (TYLENOL) 500 MG tablet   alendronate (FOSAMAX) 70 MG tablet   apixaban (ELIQUIS) 5 MG TABS tablet   atorvastatin (LIPITOR) 40 MG tablet   B Complex-C (B-COMPLEX WITH VITAMIN C) tablet   Cholecalciferol (VITAMIN D3) 5000 UNITS CAPS   desmopressin (DDAVP) 0.2 MG tablet   diltiazem (CARDIZEM CD) 120 MG 24 hr capsule   dofetilide  (TIKOSYN) 500 MCG capsule   lisinopril (ZESTRIL) 40 MG tablet   Lutein 20 MG TABS   magnesium oxide (MAG-OX) 400 MG tablet   Melatonin 10 MG TABS   metoprolol succinate (TOPROL-XL) 50 MG 24 hr tablet   Multiple Vitamin (MULTIVITAMIN WITH MINERALS) TABS tablet   Multiple Vitamins-Minerals (PRESERVISION AREDS 2 PO)   PRESCRIPTION MEDICATION   sertraline (ZOLOFT) 50 MG tablet   No current facility-administered medications for this encounter.     Brittany Felix Ward, PA-C [facility]Surgical [facility] [phone]

## 2021-08-13 NOTE — Anesthesia Preprocedure Evaluation (Addendum)
Anesthesia Evaluation  Patient identified by MRN, date of birth, ID band Patient awake    Reviewed: Allergy & Precautions, NPO status , Patient's Chart, lab work & pertinent test results  Airway Mallampati: II  TM Distance: >3 FB Neck ROM: Full    Dental no notable dental hx.    Pulmonary sleep apnea and Continuous Positive Airway Pressure Ventilation , former smoker,    Pulmonary exam normal breath sounds clear to auscultation       Cardiovascular hypertension, Pt. on medications and Pt. on home beta blockers + CAD and + Peripheral Vascular Disease  Normal cardiovascular exam+ dysrhythmias Atrial Fibrillation + pacemaker  Rhythm:Regular Rate:Normal     Neuro/Psych PSYCHIATRIC DISORDERS Anxiety Depression CVA, No Residual Symptoms    GI/Hepatic negative GI ROS, Neg liver ROS,   Endo/Other  negative endocrine ROS  Renal/GU negative Renal ROS     Musculoskeletal  (+) Arthritis ,   Abdominal (+) + obese,   Peds  Hematology negative hematology ROS (+)   Anesthesia Other Findings RIGHT HIP DEGENERATIVE JOINT DISEASE  Reproductive/Obstetrics                           Anesthesia Physical Anesthesia Plan  ASA: 3  Anesthesia Plan: Spinal   Post-op Pain Management:    Induction: Intravenous  PONV Risk Score and Plan: 2 and Ondansetron, Dexamethasone, Propofol infusion and Treatment may vary due to age or medical condition  Airway Management Planned: Simple Face Mask  Additional Equipment:   Intra-op Plan:   Post-operative Plan:   Informed Consent: I have reviewed the patients History and Physical, chart, labs and discussed the procedure including the risks, benefits and alternatives for the proposed anesthesia with the patient or authorized representative who has indicated his/her understanding and acceptance.     Dental advisory given  Plan Discussed with: CRNA  Anesthesia Plan  Comments: (Reviewed PAT note 08/09/2021, Konrad Felix Ward, PA-C)       Anesthesia Quick Evaluation

## 2021-08-14 ENCOUNTER — Ambulatory Visit (HOSPITAL_COMMUNITY): Payer: Medicare Other

## 2021-08-14 ENCOUNTER — Observation Stay (HOSPITAL_COMMUNITY)
Admission: RE | Admit: 2021-08-14 | Discharge: 2021-08-15 | Disposition: A | Discharge status: Home / Self Care | Payer: Medicare Other | Attending: Orthopaedic Surgery | Admitting: Orthopaedic Surgery

## 2021-08-14 ENCOUNTER — Ambulatory Visit (HOSPITAL_COMMUNITY): Payer: Medicare Other | Admitting: Anesthesiology

## 2021-08-14 ENCOUNTER — Encounter (HOSPITAL_COMMUNITY)
Admission: RE | Disposition: A | Discharge status: Home / Self Care | Payer: Self-pay | Source: Home / Self Care | Attending: Orthopaedic Surgery

## 2021-08-14 ENCOUNTER — Encounter (HOSPITAL_COMMUNITY): Payer: Self-pay | Admitting: Orthopaedic Surgery

## 2021-08-14 ENCOUNTER — Other Ambulatory Visit: Payer: Self-pay

## 2021-08-14 DIAGNOSIS — Z950 Presence of cardiac pacemaker: Secondary | ICD-10-CM | POA: Insufficient documentation

## 2021-08-14 DIAGNOSIS — Z7901 Long term (current) use of anticoagulants: Secondary | ICD-10-CM | POA: Insufficient documentation

## 2021-08-14 DIAGNOSIS — I5020 Unspecified systolic (congestive) heart failure: Secondary | ICD-10-CM | POA: Insufficient documentation

## 2021-08-14 DIAGNOSIS — M1611 Unilateral primary osteoarthritis, right hip: Secondary | ICD-10-CM | POA: Diagnosis not present

## 2021-08-14 DIAGNOSIS — I110 Hypertensive heart disease with heart failure: Secondary | ICD-10-CM | POA: Insufficient documentation

## 2021-08-14 DIAGNOSIS — I10 Essential (primary) hypertension: Secondary | ICD-10-CM | POA: Diagnosis not present

## 2021-08-14 DIAGNOSIS — Z96652 Presence of left artificial knee joint: Secondary | ICD-10-CM | POA: Insufficient documentation

## 2021-08-14 DIAGNOSIS — Z20822 Contact with and (suspected) exposure to covid-19: Secondary | ICD-10-CM | POA: Insufficient documentation

## 2021-08-14 DIAGNOSIS — Z79899 Other long term (current) drug therapy: Secondary | ICD-10-CM | POA: Insufficient documentation

## 2021-08-14 DIAGNOSIS — Z96641 Presence of right artificial hip joint: Secondary | ICD-10-CM

## 2021-08-14 DIAGNOSIS — I4891 Unspecified atrial fibrillation: Secondary | ICD-10-CM | POA: Insufficient documentation

## 2021-08-14 DIAGNOSIS — Z471 Aftercare following joint replacement surgery: Secondary | ICD-10-CM | POA: Diagnosis not present

## 2021-08-14 DIAGNOSIS — I2510 Atherosclerotic heart disease of native coronary artery without angina pectoris: Secondary | ICD-10-CM | POA: Diagnosis not present

## 2021-08-14 DIAGNOSIS — Z419 Encounter for procedure for purposes other than remedying health state, unspecified: Secondary | ICD-10-CM

## 2021-08-14 DIAGNOSIS — Z87891 Personal history of nicotine dependence: Secondary | ICD-10-CM | POA: Diagnosis not present

## 2021-08-14 DIAGNOSIS — Z01818 Encounter for other preprocedural examination: Secondary | ICD-10-CM

## 2021-08-14 HISTORY — PX: TOTAL HIP ARTHROPLASTY: SHX124

## 2021-08-14 LAB — RESP PANEL BY RT-PCR (FLU A&B, COVID) ARPGX2
Influenza A by PCR: NEGATIVE
Influenza B by PCR: NEGATIVE
SARS Coronavirus 2 by RT PCR: NEGATIVE

## 2021-08-14 SURGERY — ARTHROPLASTY, HIP, TOTAL, ANTERIOR APPROACH
Anesthesia: Spinal | Site: Hip | Laterality: Right

## 2021-08-14 MED ORDER — METOPROLOL TARTRATE 5 MG/5ML IV SOLN
INTRAVENOUS | Status: AC
  Administered 2021-08-14: 10:00:00 2.5 mg via INTRAVENOUS
  Filled 2021-08-14: qty 5

## 2021-08-14 MED ORDER — DOCUSATE SODIUM 100 MG PO CAPS
100.0000 mg | ORAL_CAPSULE | Freq: Two times a day (BID) | ORAL | Status: DC
  Administered 2021-08-14 – 2021-08-15 (×2): 100 mg via ORAL
  Filled 2021-08-14 (×2): qty 1

## 2021-08-14 MED ORDER — ONDANSETRON HCL 4 MG PO TABS: 4.0000 mg | ORAL_TABLET | Freq: Four times a day (QID) | ORAL | Status: DC | PRN

## 2021-08-14 MED ORDER — FENTANYL CITRATE PF 50 MCG/ML IJ SOSY
25.0000 ug | PREFILLED_SYRINGE | INTRAMUSCULAR | Status: DC | PRN
  Administered 2021-08-14: 12:00:00 50 ug via INTRAVENOUS

## 2021-08-14 MED ORDER — TRANEXAMIC ACID-NACL 1000-0.7 MG/100ML-% IV SOLN
1000.0000 mg | INTRAVENOUS | Status: AC
  Administered 2021-08-14: 08:00:00 1000 mg via INTRAVENOUS
  Filled 2021-08-14: qty 100

## 2021-08-14 MED ORDER — PROPOFOL 10 MG/ML IV BOLUS
INTRAVENOUS | Status: AC
  Filled 2021-08-14: qty 20

## 2021-08-14 MED ORDER — DEXAMETHASONE SODIUM PHOSPHATE 10 MG/ML IJ SOLN
INTRAMUSCULAR | Status: AC
  Filled 2021-08-14: qty 1

## 2021-08-14 MED ORDER — FENTANYL CITRATE (PF) 100 MCG/2ML IJ SOLN
INTRAMUSCULAR | Status: DC | PRN
Start: 2021-08-14 — End: 2021-08-14
  Administered 2021-08-14: 100 ug via INTRAVENOUS

## 2021-08-14 MED ORDER — BUPIVACAINE IN DEXTROSE 0.75-8.25 % IT SOLN
INTRATHECAL | Status: DC | PRN
Start: 2021-08-14 — End: 2021-08-14
  Administered 2021-08-14: 1.8 mL via INTRATHECAL

## 2021-08-14 MED ORDER — LACTATED RINGERS IV BOLUS
500.0000 mL | Freq: Once | INTRAVENOUS | Status: AC
  Administered 2021-08-14: 10:00:00 500 mL via INTRAVENOUS

## 2021-08-14 MED ORDER — LACTATED RINGERS IV SOLN: INTRAVENOUS | Status: DC

## 2021-08-14 MED ORDER — BUPIVACAINE-EPINEPHRINE (PF) 0.5% -1:200000 IJ SOLN
INTRAMUSCULAR | Status: DC | PRN
  Administered 2021-08-14: 30 mL

## 2021-08-14 MED ORDER — TIZANIDINE HCL 2 MG PO TABS: 2.0000 mg | ORAL_TABLET | Freq: Four times a day (QID) | ORAL | 1 refills | Status: DC | PRN

## 2021-08-14 MED ORDER — ATORVASTATIN CALCIUM 40 MG PO TABS
40.0000 mg | ORAL_TABLET | Freq: Every day | ORAL | Status: DC
  Administered 2021-08-14: 17:00:00 40 mg via ORAL
  Filled 2021-08-14: qty 1

## 2021-08-14 MED ORDER — TRANEXAMIC ACID 1000 MG/10ML IV SOLN
2000.0000 mg | INTRAVENOUS | Status: DC
  Filled 2021-08-14: qty 20

## 2021-08-14 MED ORDER — BUPIVACAINE LIPOSOME 1.3 % IJ SUSP
INTRAMUSCULAR | Status: DC | PRN
  Administered 2021-08-14: 10 mL

## 2021-08-14 MED ORDER — LACTATED RINGERS IV BOLUS: 250.0000 mL | Freq: Once | INTRAVENOUS | Status: DC

## 2021-08-14 MED ORDER — ONDANSETRON HCL 4 MG/2ML IJ SOLN: 4.0000 mg | Freq: Four times a day (QID) | INTRAMUSCULAR | Status: DC | PRN

## 2021-08-14 MED ORDER — FENTANYL CITRATE PF 50 MCG/ML IJ SOSY
PREFILLED_SYRINGE | INTRAMUSCULAR | Status: AC
  Administered 2021-08-14: 13:00:00 50 ug via INTRAVENOUS
  Filled 2021-08-14: qty 2

## 2021-08-14 MED ORDER — METOCLOPRAMIDE HCL 5 MG PO TABS: 5.0000 mg | ORAL_TABLET | Freq: Three times a day (TID) | ORAL | Status: DC | PRN

## 2021-08-14 MED ORDER — LIDOCAINE HCL (PF) 2 % IJ SOLN
INTRAMUSCULAR | Status: AC
  Filled 2021-08-14: qty 5

## 2021-08-14 MED ORDER — MORPHINE SULFATE (PF) 2 MG/ML IV SOLN
0.5000 mg | INTRAVENOUS | Status: DC | PRN
  Administered 2021-08-14: 14:00:00 1 mg via INTRAVENOUS
  Administered 2021-08-15: 01:00:00 0.5 mg via INTRAVENOUS
  Filled 2021-08-14: qty 1

## 2021-08-14 MED ORDER — LISINOPRIL 20 MG PO TABS
40.0000 mg | ORAL_TABLET | Freq: Every day | ORAL | Status: DC
  Filled 2021-08-14: qty 2

## 2021-08-14 MED ORDER — HYDROCODONE-ACETAMINOPHEN 5-325 MG PO TABS
ORAL_TABLET | ORAL | Status: AC
  Filled 2021-08-14: qty 2

## 2021-08-14 MED ORDER — LUTEIN 20 MG PO TABS: 20.0000 mg | ORAL_TABLET | Freq: Every day | ORAL | Status: DC

## 2021-08-14 MED ORDER — BISACODYL 5 MG PO TBEC: 5.0000 mg | DELAYED_RELEASE_TABLET | Freq: Every day | ORAL | Status: DC | PRN

## 2021-08-14 MED ORDER — TRANEXAMIC ACID-NACL 1000-0.7 MG/100ML-% IV SOLN: 1000.0000 mg | Freq: Once | INTRAVENOUS | Status: AC

## 2021-08-14 MED ORDER — ROCURONIUM BROMIDE 10 MG/ML (PF) SYRINGE
PREFILLED_SYRINGE | INTRAVENOUS | Status: AC
  Filled 2021-08-14: qty 10

## 2021-08-14 MED ORDER — METOPROLOL SUCCINATE ER 100 MG PO TB24
100.0000 mg | ORAL_TABLET | Freq: Two times a day (BID) | ORAL | Status: DC
  Administered 2021-08-14 – 2021-08-15 (×2): 100 mg via ORAL
  Filled 2021-08-14 (×2): qty 1

## 2021-08-14 MED ORDER — HYDROCODONE-ACETAMINOPHEN 5-325 MG PO TABS: 1.0000 | ORAL_TABLET | Freq: Four times a day (QID) | ORAL | 0 refills | Status: DC | PRN

## 2021-08-14 MED ORDER — KETOROLAC TROMETHAMINE 15 MG/ML IJ SOLN
7.5000 mg | Freq: Four times a day (QID) | INTRAMUSCULAR | Status: AC
  Administered 2021-08-14 – 2021-08-15 (×3): 7.5 mg via INTRAVENOUS
  Filled 2021-08-14 (×3): qty 1

## 2021-08-14 MED ORDER — METOPROLOL TARTRATE 5 MG/5ML IV SOLN
2.5000 mg | INTRAVENOUS | Status: AC | PRN
  Administered 2021-08-14: 10:00:00 2.5 mg via INTRAVENOUS

## 2021-08-14 MED ORDER — BUPIVACAINE LIPOSOME 1.3 % IJ SUSP
INTRAMUSCULAR | Status: AC
  Filled 2021-08-14: qty 10

## 2021-08-14 MED ORDER — PHENYLEPHRINE 40 MCG/ML (10ML) SYRINGE FOR IV PUSH (FOR BLOOD PRESSURE SUPPORT)
PREFILLED_SYRINGE | INTRAVENOUS | Status: DC | PRN
  Administered 2021-08-14 (×2): 80 ug via INTRAVENOUS

## 2021-08-14 MED ORDER — POVIDONE-IODINE 10 % EX SWAB
2.0000 "application " | Freq: Once | CUTANEOUS | Status: AC
  Administered 2021-08-14: 2 via TOPICAL

## 2021-08-14 MED ORDER — ORAL CARE MOUTH RINSE: 15.0000 mL | Freq: Once | OROMUCOSAL | Status: AC

## 2021-08-14 MED ORDER — BUPIVACAINE LIPOSOME 1.3 % IJ SUSP: 20.0000 mL | Freq: Once | INTRAMUSCULAR | Status: DC

## 2021-08-14 MED ORDER — CEFAZOLIN SODIUM-DEXTROSE 2-4 GM/100ML-% IV SOLN
2.0000 g | INTRAVENOUS | Status: AC
  Administered 2021-08-14: 07:00:00 2 g via INTRAVENOUS
  Filled 2021-08-14: qty 100

## 2021-08-14 MED ORDER — MELATONIN 5 MG PO TABS: 10.0000 mg | ORAL_TABLET | Freq: Every evening | ORAL | Status: DC | PRN

## 2021-08-14 MED ORDER — METOCLOPRAMIDE HCL 5 MG/ML IJ SOLN: 5.0000 mg | Freq: Three times a day (TID) | INTRAMUSCULAR | Status: DC | PRN

## 2021-08-14 MED ORDER — MENTHOL 3 MG MT LOZG: 1.0000 | LOZENGE | OROMUCOSAL | Status: DC | PRN

## 2021-08-14 MED ORDER — SERTRALINE HCL 50 MG PO TABS
50.0000 mg | ORAL_TABLET | Freq: Every day | ORAL | Status: DC
  Administered 2021-08-15: 12:00:00 50 mg via ORAL
  Filled 2021-08-14: qty 1

## 2021-08-14 MED ORDER — PROPOFOL 500 MG/50ML IV EMUL
INTRAVENOUS | Status: AC
  Filled 2021-08-14: qty 50

## 2021-08-14 MED ORDER — MORPHINE SULFATE (PF) 2 MG/ML IV SOLN
INTRAVENOUS | Status: AC
  Filled 2021-08-14: qty 1

## 2021-08-14 MED ORDER — HYDROCODONE-ACETAMINOPHEN 5-325 MG PO TABS
1.0000 | ORAL_TABLET | ORAL | Status: DC | PRN
  Administered 2021-08-14 (×2): 2 via ORAL
  Filled 2021-08-14: qty 2

## 2021-08-14 MED ORDER — AMISULPRIDE (ANTIEMETIC) 5 MG/2ML IV SOLN: 10.0000 mg | Freq: Once | INTRAVENOUS | Status: DC | PRN

## 2021-08-14 MED ORDER — DOFETILIDE 250 MCG PO CAPS
500.0000 ug | ORAL_CAPSULE | Freq: Two times a day (BID) | ORAL | Status: DC
Start: 2021-08-14 — End: 2021-08-15
  Administered 2021-08-14 – 2021-08-15 (×2): 500 ug via ORAL
  Filled 2021-08-14 (×3): qty 2

## 2021-08-14 MED ORDER — TRANEXAMIC ACID 1000 MG/10ML IV SOLN
INTRAVENOUS | Status: DC | PRN
  Administered 2021-08-14: 09:00:00 2000 mg via TOPICAL

## 2021-08-14 MED ORDER — ACETAMINOPHEN 500 MG PO TABS
500.0000 mg | ORAL_TABLET | Freq: Four times a day (QID) | ORAL | Status: AC
  Administered 2021-08-14 – 2021-08-15 (×3): 500 mg via ORAL
  Filled 2021-08-14 (×3): qty 1

## 2021-08-14 MED ORDER — PHENYLEPHRINE HCL (PRESSORS) 10 MG/ML IV SOLN
INTRAVENOUS | Status: AC
  Filled 2021-08-14: qty 1

## 2021-08-14 MED ORDER — METHOCARBAMOL 500 MG PO TABS
500.0000 mg | ORAL_TABLET | Freq: Four times a day (QID) | ORAL | Status: DC | PRN
  Administered 2021-08-14: 12:00:00 500 mg via ORAL

## 2021-08-14 MED ORDER — BUPIVACAINE-EPINEPHRINE (PF) 0.5% -1:200000 IJ SOLN
INTRAMUSCULAR | Status: AC
  Filled 2021-08-14: qty 30

## 2021-08-14 MED ORDER — ACETAMINOPHEN 325 MG PO TABS: 325.0000 mg | ORAL_TABLET | Freq: Four times a day (QID) | ORAL | Status: DC | PRN

## 2021-08-14 MED ORDER — TRANEXAMIC ACID-NACL 1000-0.7 MG/100ML-% IV SOLN
INTRAVENOUS | Status: AC
  Administered 2021-08-14: 14:00:00 1000 mg via INTRAVENOUS
  Filled 2021-08-14: qty 100

## 2021-08-14 MED ORDER — DIPHENHYDRAMINE HCL 12.5 MG/5ML PO ELIX: 12.5000 mg | ORAL_SOLUTION | ORAL | Status: DC | PRN

## 2021-08-14 MED ORDER — CHLORHEXIDINE GLUCONATE 0.12 % MT SOLN
15.0000 mL | Freq: Once | OROMUCOSAL | Status: AC
  Administered 2021-08-14: 06:00:00 15 mL via OROMUCOSAL

## 2021-08-14 MED ORDER — MAGNESIUM OXIDE -MG SUPPLEMENT 400 (240 MG) MG PO TABS
400.0000 mg | ORAL_TABLET | Freq: Every day | ORAL | Status: DC
  Administered 2021-08-15: 12:00:00 400 mg via ORAL
  Filled 2021-08-14: qty 1

## 2021-08-14 MED ORDER — PHENOL 1.4 % MT LIQD: 1.0000 | OROMUCOSAL | Status: DC | PRN

## 2021-08-14 MED ORDER — APIXABAN 5 MG PO TABS
5.0000 mg | ORAL_TABLET | Freq: Two times a day (BID) | ORAL | Status: DC
  Administered 2021-08-15: 12:00:00 5 mg via ORAL
  Filled 2021-08-14: qty 1

## 2021-08-14 MED ORDER — PROPOFOL 10 MG/ML IV BOLUS
INTRAVENOUS | Status: DC | PRN
  Administered 2021-08-14: 30 mg via INTRAVENOUS
  Administered 2021-08-14 (×3): 20 mg via INTRAVENOUS

## 2021-08-14 MED ORDER — CEFAZOLIN SODIUM-DEXTROSE 2-4 GM/100ML-% IV SOLN
2.0000 g | Freq: Four times a day (QID) | INTRAVENOUS | Status: AC
  Administered 2021-08-14: 20:00:00 2 g via INTRAVENOUS
  Filled 2021-08-14: qty 100

## 2021-08-14 MED ORDER — CEFAZOLIN SODIUM-DEXTROSE 2-4 GM/100ML-% IV SOLN
INTRAVENOUS | Status: AC
  Administered 2021-08-14: 14:00:00 2 g via INTRAVENOUS
  Filled 2021-08-14: qty 100

## 2021-08-14 MED ORDER — 0.9 % SODIUM CHLORIDE (POUR BTL) OPTIME
TOPICAL | Status: DC | PRN
  Administered 2021-08-14: 08:00:00 1000 mL

## 2021-08-14 MED ORDER — ACETAMINOPHEN 500 MG PO TABS
1000.0000 mg | ORAL_TABLET | Freq: Once | ORAL | Status: AC
  Administered 2021-08-14: 06:00:00 1000 mg via ORAL
  Filled 2021-08-14: qty 2

## 2021-08-14 MED ORDER — PROPOFOL 500 MG/50ML IV EMUL
INTRAVENOUS | Status: DC | PRN
  Administered 2021-08-14: 50 ug/kg/min via INTRAVENOUS

## 2021-08-14 MED ORDER — ALUM & MAG HYDROXIDE-SIMETH 200-200-20 MG/5ML PO SUSP
30.0000 mL | ORAL | Status: DC | PRN
  Administered 2021-08-14: 16:00:00 30 mL via ORAL
  Filled 2021-08-14: qty 30

## 2021-08-14 MED ORDER — ONDANSETRON HCL 4 MG/2ML IJ SOLN
INTRAMUSCULAR | Status: AC
  Filled 2021-08-14: qty 2

## 2021-08-14 MED ORDER — FENTANYL CITRATE (PF) 100 MCG/2ML IJ SOLN
INTRAMUSCULAR | Status: AC
  Filled 2021-08-14: qty 2

## 2021-08-14 MED ORDER — DILTIAZEM HCL ER COATED BEADS 120 MG PO CP24
120.0000 mg | ORAL_CAPSULE | Freq: Two times a day (BID) | ORAL | Status: DC
  Administered 2021-08-14 – 2021-08-15 (×2): 120 mg via ORAL
  Filled 2021-08-14 (×2): qty 1

## 2021-08-14 MED ORDER — ONDANSETRON HCL 4 MG/2ML IJ SOLN: 4.0000 mg | Freq: Once | INTRAMUSCULAR | Status: DC | PRN

## 2021-08-14 MED ORDER — METHOCARBAMOL 500 MG IVPB - SIMPLE MED
500.0000 mg | Freq: Four times a day (QID) | INTRAVENOUS | Status: DC | PRN
  Filled 2021-08-14: qty 50

## 2021-08-14 MED ORDER — DEXAMETHASONE SODIUM PHOSPHATE 10 MG/ML IJ SOLN
INTRAMUSCULAR | Status: DC | PRN
  Administered 2021-08-14: 10 mg via INTRAVENOUS

## 2021-08-14 MED ORDER — METHOCARBAMOL 500 MG PO TABS
ORAL_TABLET | ORAL | Status: AC
  Filled 2021-08-14: qty 1

## 2021-08-14 SURGICAL SUPPLY — 50 items
BAG COUNTER SPONGE SURGICOUNT (BAG) IMPLANT
BAG DECANTER FOR FLEXI CONT (MISCELLANEOUS) ×3 IMPLANT
BAG SURGICOUNT SPONGE COUNTING (BAG)
BLADE SAW SGTL 18X1.27X75 (BLADE) ×2 IMPLANT
BLADE SAW SGTL 18X1.27X75MM (BLADE) ×1
BOOTIES KNEE HIGH SLOAN (MISCELLANEOUS) ×3 IMPLANT
CELLS DAT CNTRL 66122 CELL SVR (MISCELLANEOUS) ×1 IMPLANT
COVER PERINEAL POST (MISCELLANEOUS) ×3 IMPLANT
COVER SURGICAL LIGHT HANDLE (MISCELLANEOUS) ×3 IMPLANT
CUP ACETABULAR GRIPTON 100 52 (Orthopedic Implant) IMPLANT
DECANTER SPIKE VIAL GLASS SM (MISCELLANEOUS) ×3 IMPLANT
DRAPE FOOT SWITCH (DRAPES) ×3 IMPLANT
DRAPE IMP U-DRAPE 54X76 (DRAPES) ×3 IMPLANT
DRAPE STERI IOBAN 125X83 (DRAPES) ×3 IMPLANT
DRAPE U-SHAPE 47X51 STRL (DRAPES) ×6 IMPLANT
DRSG AQUACEL AG ADV 3.5X 6 (GAUZE/BANDAGES/DRESSINGS) ×3 IMPLANT
DURAPREP 26ML APPLICATOR (WOUND CARE) ×3 IMPLANT
ELECT BLADE TIP CTD 4 INCH (ELECTRODE) ×3 IMPLANT
ELECT REM PT RETURN 15FT ADLT (MISCELLANEOUS) ×3 IMPLANT
ELIMINATOR HOLE APEX DEPUY (Hips) ×2 IMPLANT
GLOVE SRG 8 PF TXTR STRL LF DI (GLOVE) ×2 IMPLANT
GLOVE SURG ENC MOIS LTX SZ8 (GLOVE) ×6 IMPLANT
GLOVE SURG UNDER POLY LF SZ8 (GLOVE) ×4
GOWN STRL REUS W/TWL XL LVL3 (GOWN DISPOSABLE) ×6 IMPLANT
GRIPTON 100 52 (Orthopedic Implant) ×3 IMPLANT
HEAD M SROM 36MM 2 (Hips) IMPLANT
HOLDER FOLEY CATH W/STRAP (MISCELLANEOUS) ×3 IMPLANT
KIT TURNOVER KIT A (KITS) IMPLANT
LINER ACETAB NEUTRAL 36ID 520D (Liner) ×2 IMPLANT
MANIFOLD NEPTUNE II (INSTRUMENTS) ×3 IMPLANT
NEEDLE HYPO 22GX1.5 SAFETY (NEEDLE) ×3 IMPLANT
NS IRRIG 1000ML POUR BTL (IV SOLUTION) ×3 IMPLANT
PACK ANTERIOR HIP CUSTOM (KITS) ×3 IMPLANT
PENCIL SMOKE EVACUATOR (MISCELLANEOUS) IMPLANT
PROTECTOR NERVE ULNAR (MISCELLANEOUS) ×3 IMPLANT
RETRACTOR WND ALEXIS 18 MED (MISCELLANEOUS) ×1 IMPLANT
RTRCTR WOUND ALEXIS 18CM MED (MISCELLANEOUS) ×3
SPONGE T-LAP 18X18 ~~LOC~~+RFID (SPONGE) ×4 IMPLANT
SROM M HEAD 36MM 2 (Hips) ×3 IMPLANT
STEM FEM ACTIS HIGH SZ7 (Stem) ×2 IMPLANT
SUT ETHIBOND NAB CT1 #1 30IN (SUTURE) ×6 IMPLANT
SUT VIC AB 1 CT1 36 (SUTURE) ×3 IMPLANT
SUT VIC AB 2-0 CT1 27 (SUTURE) ×2
SUT VIC AB 2-0 CT1 TAPERPNT 27 (SUTURE) ×1 IMPLANT
SUT VICRYL AB 3-0 FS1 BRD 27IN (SUTURE) ×3 IMPLANT
SUT VLOC 180 0 24IN GS25 (SUTURE) ×3 IMPLANT
SYR 50ML LL SCALE MARK (SYRINGE) ×3 IMPLANT
TRAY FOLEY MTR SLVR 14FR STAT (SET/KITS/TRAYS/PACK) ×2 IMPLANT
TRAY FOLEY MTR SLVR 16FR STAT (SET/KITS/TRAYS/PACK) ×1 IMPLANT
YANKAUER SUCT BULB TIP NO VENT (SUCTIONS) ×2 IMPLANT

## 2021-08-14 NOTE — Op Note (Signed)
PRE-OP DIAGNOSIS:  RIGHT HIP DEGENERATIVE JOINT DISEASE POST-OP DIAGNOSIS:  same PROCEDURE: RIGHT TOTAL HIP ARTHROPLASTY ANTERIOR APPROACH ANESTHESIA:  Spinal and MAC SURGEON:  Melrose Nakayama MD ASSISTANT:  Loni Dolly PA-C   INDICATIONS FOR PROCEDURE:  The patient is a 88 y.o. female with a long history of a painful hip.  This has persisted despite multiple conservative measures.  The patient has persisted with pain and dysfunction making rest and activity difficult.  A total hip replacement is offered as surgical treatment.  Informed operative consent was obtained after discussion of possible complications including reaction to anesthesia, infection, neurovascular injury, dislocation, DVT, PE, and death.  The importance of the postoperative rehab program to optimize result was stressed with the patient.  SUMMARY OF FINDINGS AND PROCEDURE:  Under the above anesthesia through a anterior approach an the Hana table a right THR was performed.  The patient had severe degenerative change and good bone quality.  We used DePuy components to replace the hip and these were size 7 high offset Actis femur capped with a -2 34m metal hip ball.  On the acetabular side we used a size 52 Gription shell with a  plus 0 neutral polyethylene liner.  We did use a hole eliminator.  ALoni DollyPA-C assisted throughout and was invaluable to the completion of the case in that he helped position and retract while I performed the procedure.  He also closed simultaneously to help minimize OR time.  I used fluoroscopy throughout the case to check position of implants and leg lengths and read all of these views myself.  DESCRIPTION OF PROCEDURE:  The patient was taken to the OR suite where the above anesthetic was applied.  The patient was then positioned on the Hana table supine.  All bony prominences were appropriately padded.  Prep and drape was then performed in normal sterile fashion.  The patient was given kefzol preoperative  antibiotic and an appropriate time out was performed.  We then took an anterior approach to the right hip.  Dissection was taken through adipose to the tensor fascia lata fascia.  This structure was incised longitudinally and we dissected in the intermuscular interval just medial to this muscle.  Cobra retractors were placed superior and inferior to the femoral neck superficial to the capsule.  A capsular incision was then made and the retractors were placed along the femoral neck.  Xray was brought in to get a good level for the femoral neck cut which was made with an oscillating saw and osteotome.  The femoral head was removed with a corkscrew.  The acetabulum was exposed and some labral tissues were excised. Reaming was taken to the inside wall of the pelvis and sequentially up to 1 mm smaller than the actual component.  A trial of components was done and then the aforementioned acetabular shell was placed in appropriate tilt and anteversion confirmed by fluoroscopy. The liner was placed along with the hole eliminator and attention was turned to the femur.  The leg was brought down and over into adduction and the elevator bar was used to raise the femur up gently in the wound.  The piriformis was released with care taken to preserve the obturator internus attachment and all of the posterior capsule. The femur was reamed and then broached to the appropriate size.  A trial reduction was done and the aforementioned head and neck assembly gave u[place]oreathe best stability in extension with external rotation.  Leg lengths were felt to be  about equal by fluoroscopic exam.  The trial components were removed and the wound irrigated.  We then placed the femoral component in appropriate anteversion.  The head was applied to a dry stem neck and the hip again reduced.  It was again stable in the aforementioned position.  The would was irrigated again followed by re-approximation of anterior capsule with ethibond suture. Tensor  fascia was repaired with V-loc suture  followed by deep closure with #O and #2 undyed vicryl.  Skin was closed with subQ stitch and steristrips followed by a sterile dressing.  EBL and IOF can be obtained from anesthesia records.  DISPOSITION:  The patient was taken to PACU in stable condition to potentially go home same day depending on ability to walk and tolerate liquids.

## 2021-08-14 NOTE — Anesthesia Procedure Notes (Signed)
Date/Time: 08/14/2021 7:31 AM Performed by: Sharlette Dense, CRNA Oxygen Delivery Method: Simple face mask

## 2021-08-14 NOTE — Progress Notes (Signed)
PT Cancellation Note  Patient Details Name: Brittany [name] MRN: [id number] DOB: 07-10-1934   Cancelled Treatment:    Reason Eval/Treat Not Completed: Medical issues which prohibited therapy, Patient will be placed on telemetry. Will start PT 08/15/21   Claretha Cooper 08/14/2021, 3:40 PM Tresa Endo PT Acute Rehabilitation Services Pager [phone] Office [phone]

## 2021-08-14 NOTE — Anesthesia Postprocedure Evaluation (Signed)
Anesthesia Post Note  Patient: Brittany [name]  Procedure(s) Performed: TOTAL HIP ARTHROPLASTY ANTERIOR APPROACH (Right: Hip)     Patient location during evaluation: PACU Anesthesia Type: Spinal Level of consciousness: awake and alert Pain management: pain level controlled Vital Signs Assessment: post-procedure vital signs reviewed and stable Respiratory status: spontaneous breathing, nonlabored ventilation, respiratory function stable and patient connected to nasal cannula oxygen Cardiovascular status: stable and blood pressure returned to baseline Postop Assessment: no apparent nausea or vomiting Anesthetic complications: no Comments: Patient with tachycardia on presentation to PACU. Patient received metoprolol. ECG showed rate of 87. Patient now stable for transfer to the floor.    No notable events documented.  Last Vitals:  Vitals:   08/14/21 1200 08/14/21 1300  BP: 116/77 (!) 90/50  Pulse: 90 93  Resp: 13 13  Temp:    SpO2: 90% (!) 89%    Last Pain:  Vitals:   08/14/21 1300  TempSrc:   PainSc: 5                  Dallon Dacosta P Chalmer Zheng

## 2021-08-14 NOTE — Interval H&P Note (Signed)
History and Physical Interval Note:  08/14/2021 7:25 AM  Brittany [name]  has presented today for surgery, with the diagnosis of RIGHT HIP DEGENERATIVE JOINT DISEASE.  The various methods of treatment have been discussed with the patient and family. After consideration of risks, benefits and other options for treatment, the patient has consented to  Procedure(s): TOTAL HIP ARTHROPLASTY ANTERIOR APPROACH (Right) as a surgical intervention.  The patient's history has been reviewed, patient examined, no change in status, stable for surgery.  I have reviewed the patient's chart and labs.  Questions were answered to the patient's satisfaction.     Hessie Dibble

## 2021-08-14 NOTE — Anesthesia Procedure Notes (Signed)
Spinal  Patient location during procedure: OR Start time: 08/14/2021 7:30 AM End time: 08/14/2021 7:35 AM Reason for block: surgical anesthesia Staffing Performed: anesthesiologist  Anesthesiologist: Murvin Natal, MD Preanesthetic Checklist Completed: patient identified, IV checked, risks and benefits discussed, surgical consent, monitors and equipment checked, pre-op evaluation and timeout performed Spinal Block Patient position: sitting Prep: DuraPrep Patient monitoring: cardiac monitor, continuous pulse ox and blood pressure Approach: midline Location: L4-5 Injection technique: single-shot Needle Needle type: Pencan  Needle gauge: 24 G Needle length: 9 cm Assessment Sensory level: T10 Events: CSF return Additional Notes Functioning IV was confirmed and monitors were applied. Sterile prep and drape, including hand hygiene and sterile gloves were used. The patient was positioned and the spine was prepped. The skin was anesthetized with lidocaine.  Free flow of clear CSF was obtained prior to injecting local anesthetic into the CSF.  The spinal needle aspirated freely following injection.  The needle was carefully withdrawn.  The patient tolerated the procedure well.

## 2021-08-14 NOTE — Transfer of Care (Signed)
Immediate Anesthesia Transfer of Care Note  Patient: Brittany [name]  Procedure(s) Performed: TOTAL HIP ARTHROPLASTY ANTERIOR APPROACH (Right: Hip)  Patient Location: PACU  Anesthesia Type:Spinal  Level of Consciousness: awake and alert   Airway & Oxygen Therapy: Patient Spontanous Breathing and Patient connected to face mask oxygen  Post-op Assessment: Report given to RN and Post -op Vital signs reviewed and stable  Post vital signs: Reviewed and stable  Last Vitals:  Vitals Value Taken Time  BP 116/82 08/14/21 0927  Temp    Pulse 87 08/14/21 0927  Resp 18 08/14/21 0927  SpO2 100 % 08/14/21 0927  Vitals shown include unvalidated device data.  Last Pain:  Vitals:   08/14/21 0628  TempSrc: Oral  PainSc:          Complications: No notable events documented.

## 2021-08-15 ENCOUNTER — Encounter (HOSPITAL_COMMUNITY): Payer: Self-pay | Admitting: Orthopaedic Surgery

## 2021-08-15 DIAGNOSIS — I5020 Unspecified systolic (congestive) heart failure: Secondary | ICD-10-CM | POA: Diagnosis not present

## 2021-08-15 DIAGNOSIS — Z20822 Contact with and (suspected) exposure to covid-19: Secondary | ICD-10-CM | POA: Diagnosis not present

## 2021-08-15 DIAGNOSIS — Z96652 Presence of left artificial knee joint: Secondary | ICD-10-CM | POA: Diagnosis not present

## 2021-08-15 DIAGNOSIS — M1611 Unilateral primary osteoarthritis, right hip: Secondary | ICD-10-CM | POA: Diagnosis not present

## 2021-08-15 DIAGNOSIS — I110 Hypertensive heart disease with heart failure: Secondary | ICD-10-CM | POA: Diagnosis not present

## 2021-08-15 DIAGNOSIS — Z950 Presence of cardiac pacemaker: Secondary | ICD-10-CM | POA: Diagnosis not present

## 2021-08-15 LAB — CBC
HCT: 25.4 % — ABNORMAL LOW (ref 36.0–46.0)
Hemoglobin: 8.1 g/dL — ABNORMAL LOW (ref 12.0–15.0)
MCH: 30.8 pg (ref 26.0–34.0)
MCHC: 31.9 g/dL (ref 30.0–36.0)
MCV: 96.6 fL (ref 80.0–100.0)
Platelets: 209 10*3/uL (ref 150–400)
RBC: 2.63 MIL/uL — ABNORMAL LOW (ref 3.87–5.11)
RDW: 14.1 % (ref 11.5–15.5)
WBC: 13.2 10*3/uL — ABNORMAL HIGH (ref 4.0–10.5)
nRBC: 0 % (ref 0.0–0.2)

## 2021-08-15 LAB — BASIC METABOLIC PANEL
Anion gap: 6 (ref 5–15)
BUN: 25 mg/dL — ABNORMAL HIGH (ref 8–23)
CO2: 25 mmol/L (ref 22–32)
Calcium: 9.2 mg/dL (ref 8.9–10.3)
Chloride: 100 mmol/L (ref 98–111)
Creatinine, Ser: 0.83 mg/dL (ref 0.44–1.00)
GFR, Estimated: >60 mL/min (ref >60)
Glucose, Bld: 147 mg/dL — ABNORMAL HIGH (ref 70–99)
Potassium: 4.1 mmol/L (ref 3.5–5.1)
Sodium: 131 mmol/L — ABNORMAL LOW (ref 135–145)

## 2021-08-15 LAB — HEMOGLOBIN AND HEMATOCRIT, BLOOD
HCT: 26.8 % — ABNORMAL LOW (ref 36.0–46.0)
Hemoglobin: 9 g/dL — ABNORMAL LOW (ref 12.0–15.0)

## 2021-08-15 LAB — PREPARE RBC (CROSSMATCH)

## 2021-08-15 MED ORDER — SODIUM CHLORIDE 0.9% IV SOLUTION: Freq: Once | INTRAVENOUS | Status: AC

## 2021-08-15 NOTE — Discharge Summary (Signed)
Patient ID: Brittany [name] MRN: [id number] DOB/AGE: 27-Sep-1933 88 y.o.  Admit date: 08/14/2021 Discharge date: 08/15/2021  Admission Diagnoses:  Principal Problem:   Primary localized osteoarthritis of right hip Active Problems:   Status post hip replacement, right   Discharge Diagnoses:  Same  Past Medical History:  Diagnosis Date   Ankle fracture 2016   Arthritis    Atrial fibrillation (HCC)    Depression    Dysrhythmia    Elevated parathyroid hormone    Fracture of orbital floor with routine healing    GERD (gastroesophageal reflux disease)    History of colon polyps    benign   Hypercalcemia    Hypertension    Hyponatremia    Hypothyroid    Macular degeneration    wet in the right and dry in the left   OSA on CPAP    Osteoporosis    Peripheral vascular disease (HCC)    Presence of permanent cardiac pacemaker    PSVT (paroxysmal supraventricular tachycardia) (HCC)    Rotator cuff tear    Sinus node dysfunction ([place])    a. s/p MDT pacemaker   Stroke [facility])    Urinary incontinence    Wrist fracture 2018    Surgeries: Procedure(s): TOTAL HIP ARTHROPLASTY ANTERIOR APPROACH on 08/14/2021   Consultants:   Discharged Condition: Improved  Hospital Course: Brittany [name] is an 88 y.o. female who was admitted 08/14/2021 for operative treatment ofPrimary localized osteoarthritis of right hip. Patient has severe unremitting pain that affects sleep, daily activities, and work/hobbies. After pre-op clearance the patient was taken to the operating room on 08/14/2021 and underwent  Procedure(s): TOTAL HIP ARTHROPLASTY ANTERIOR APPROACH.    Patient was given perioperative antibiotics:  Anti-infectives (From admission, onward)    Start     Dose/Rate Route Frequency Ordered Stop   08/14/21 1330  ceFAZolin (ANCEF) IVPB 2g/100 mL premix        2 g 200 mL/hr over 30 Minutes Intravenous Every 6 hours 08/14/21 0937 08/14/21 2050   08/14/21 0600  ceFAZolin (ANCEF) IVPB 2g/100 mL  premix        2 g 200 mL/hr over 30 Minutes Intravenous On call to O.R. 08/14/21 5366 08/14/21 4403        Patient was given sequential compression devices, early ambulation, and chemoprophylaxis to prevent DVT.  The patient had a hemoglobin of 8.1 this morning.  She was given 1 unit of blood.  She feels great and passed therapy.  Patient benefited maximally from hospital stay and there were no complications.    Recent vital signs: Patient Vitals for the past 24 hrs:  BP Temp Temp src Pulse Resp SpO2 Height  08/15/21 1430 (!) 121/47 98.3 F (36.8 C) Oral 60 18 97 % --  08/15/21 1140 (!) 106/49 97.8 F (36.6 C) Axillary (!) 59 18 99 % --  08/15/21 1115 (!) 108/51 98.1 F (36.7 C) Axillary (!) 59 18 97 % --  08/15/21 0932 (!) 104/42 98.1 F (36.7 C) -- (!) 59 20 96 % --  08/15/21 0413 (!) 137/59 (!) 97.5 F (36.4 C) Oral 65 18 95 % --  08/14/21 2323 (!) 125/55 -- -- 61 19 97 % --  08/14/21 2017 (!) 123/54 97.7 F (36.5 C) -- 61 -- 97 % --  08/14/21 1646 -- -- -- -- -- -- 5\' 8"  (1.727 m)  08/14/21 1615 (!) 151/84 98 F (36.7 C) Oral 93 -- 96 % --  08/14/21 1500 130/79 -- --  91 12 100 % --     Recent laboratory studies:  Recent Labs    08/15/21 0439  WBC 13.2*  HGB 8.1*  HCT 25.4*  PLT 209  NA 131*  K 4.1  CL 100  CO2 25  BUN 25*  CREATININE 0.83  GLUCOSE 147*  CALCIUM 9.2     Discharge Medications:   Allergies as of 08/15/2021       Reactions   Adhesive [tape] Swelling, Rash   Benzalkonium Chloride Rash   Pt was not aware of this allergy   Neosporin [neomycin-polymyxin-gramicidin] Swelling, Rash        Medication List     TAKE these medications    acetaminophen 500 MG tablet Commonly known as: TYLENOL Take 1,000 mg by mouth every 6 (six) hours as needed for moderate pain.   alendronate 70 MG tablet Commonly known as: FOSAMAX Take 1 tablet (70 mg total) by mouth every 7 (seven) days. Take with a full glass of water on an empty stomach.    apixaban 5 MG Tabs tablet Commonly known as: ELIQUIS Take 1 tablet (5 mg total) by mouth 2 (two) times daily.   atorvastatin 40 MG tablet Commonly known as: LIPITOR Take 1 tablet (40 mg total) by mouth daily at 6 PM.   B-complex with vitamin C tablet Take 1 tablet by mouth daily with lunch.   desmopressin 0.2 MG tablet Commonly known as: DDAVP Take 0.2 mg by mouth daily as needed (to control bladder on car trip).   diltiazem 120 MG 24 hr capsule Commonly known as: CARDIZEM CD Take 1 capsule (120 mg total) by mouth in the morning and at bedtime.   dofetilide 500 MCG capsule Commonly known as: TIKOSYN TAKE ONE CAPSULE BY MOUTH TWICE DAILY   HYDROcodone-acetaminophen 5-325 MG tablet Commonly known as: NORCO/VICODIN Take 1 tablet by mouth every 6 (six) hours as needed for moderate pain or severe pain (post op pain).   lisinopril 40 MG tablet Commonly known as: ZESTRIL Take 40 mg by mouth daily.   Lutein 20 MG Tabs Take 20 mg by mouth daily with breakfast.   magnesium oxide 400 MG tablet Commonly known as: MAG-OX Take 1 tablet (400 mg total) by mouth daily.   Melatonin 10 MG Tabs Take 10-40 mg by mouth at bedtime as needed (sleep).   metoprolol succinate 50 MG 24 hr tablet Commonly known as: TOPROL-XL Take 2 tablets by mouth twice daily with or immediately following meals What changed: See the new instructions.   multivitamin with minerals Tabs tablet Take 1 tablet by mouth daily with breakfast.   PRESCRIPTION MEDICATION Inhale into the lungs at bedtime. CPAP   PRESERVISION AREDS 2 PO Take 1 capsule by mouth 2 (two) times daily with a meal.   sertraline 50 MG tablet Commonly known as: Zoloft Take 1 tablet (50 mg total) by mouth daily.   tiZANidine 2 MG tablet Commonly known as: ZANAFLEX Take 1-2 tablets (2-4 mg total) by mouth every 6 (six) hours as needed for muscle spasms.   Vitamin D3 125 MCG (5000 UT) Caps Take 5,000 Units by mouth daily with  breakfast.               Durable Medical Equipment  (From admission, onward)           Start     Ordered   08/14/21 1117  DME Walker rolling  Once       Question:  Patient needs a walker to treat  with the following condition  Answer:  Primary osteoarthritis of right hip   08/14/21 1116   08/14/21 1117  DME 3 n 1  Once        08/14/21 1116   08/14/21 1117  DME Bedside commode  Once       Question:  Patient needs a bedside commode to treat with the following condition  Answer:  Primary osteoarthritis of right hip   08/14/21 1116            Diagnostic Studies: DG C-Arm 1-60 Min-No Report  Result Date: 08/14/2021 Fluoroscopy was utilized by the requesting physician.  No radiographic interpretation.   ECHOCARDIOGRAM COMPLETE  Result Date: 08/10/2021    ECHOCARDIOGRAM REPORT   Patient Name:   MADILYN CEPHAS [facility] Date of Exam: 08/10/2021 Medical Rec #:  [id number]       Height:       68.0 in Accession #:    [id number]      Weight:       209.0 lb Date of Birth:  October 07, 1933       BSA:          2.082 m Patient Age:    34 years        BP:           112/68 mmHg Patient Gender: F               HR:           86 bpm. Exam Location:  [facility] Procedure: 2D Echo, 3D Echo, Cardiac Doppler, Color Doppler and Strain Analysis Indications:    R06.02 Shortness of breath  History:        Patient has prior history of Echocardiogram examinations, most                 recent 12/31/2017. CHF, Pacemaker, Stroke, Arrythmias:Atrial                 Fibrillation and Bradycardia; Risk Factors:Hypertension, Sleep                 Apnea, Dyslipidemia and Former Smoker. Sinus node dysfunction.                 Obesity.  Sonographer:    Basilia Jumbo BS, RDCS Referring Phys: [place]  1. Left ventricular ejection fraction, by estimation, is 55 to 60%. The left ventricle has normal function. The left ventricle has no regional wall motion abnormalities. There is mild asymmetric left ventricular  hypertrophy of the basal and septal segments. Left ventricular diastolic parameters are consistent with Grade I diastolic dysfunction (impaired relaxation). The average left ventricular global longitudinal strain is -20.1 %. The global longitudinal strain is normal.  2. Pacing wires in RA/RV . Right ventricular systolic function is normal. The right ventricular size is normal. There is moderately elevated pulmonary artery systolic pressure.  3. Left atrial size was moderately dilated.  4. Right atrial size was moderately dilated.  5. The mitral valve is degenerative. Mild to moderate mitral valve regurgitation. No evidence of mitral stenosis.  6. Tricuspid valve regurgitation is mild to moderate.  7. The aortic valve is tricuspid. There is moderate calcification of the aortic valve. There is moderate thickening of the aortic valve. Aortic valve regurgitation is mild. Aortic valve sclerosis/calcification is present, without any evidence of aortic stenosis.  8. Aortic dilatation noted. There is mild dilatation of the ascending aorta, measuring 38 mm.  9. The inferior vena cava  is normal in size with greater than 50% respiratory variability, suggesting right atrial pressure of 3 mmHg. Comparison(s): 12/31/17 EF 55-60%. FINDINGS  Left Ventricle: Left ventricular ejection fraction, by estimation, is 55 to 60%. The left ventricle has normal function. The left ventricle has no regional wall motion abnormalities. The average left ventricular global longitudinal strain is -20.1 %. The global longitudinal strain is normal. The left ventricular internal cavity size was normal in size. There is mild asymmetric left ventricular hypertrophy of the basal and septal segments. Left ventricular diastolic parameters are consistent with Grade I diastolic dysfunction (impaired relaxation). Right Ventricle: Pacing wires in RA/RV. The right ventricular size is normal. No increase in right ventricular wall thickness. Right ventricular  systolic function is normal. There is moderately elevated pulmonary artery systolic pressure. The tricuspid regurgitant velocity is 3.35 m/s, and with an assumed right atrial pressure of 3 mmHg, the estimated right ventricular systolic pressure is 29.4 mmHg. Left Atrium: Left atrial size was moderately dilated. Right Atrium: Right atrial size was moderately dilated. Pericardium: There is no evidence of pericardial effusion. Mitral Valve: The mitral valve is degenerative in appearance. There is mild thickening of the mitral valve leaflet(s). There is mild calcification of the mitral valve leaflet(s). Mild to moderate mitral valve regurgitation. No evidence of mitral valve stenosis. Tricuspid Valve: The tricuspid valve is normal in structure. Tricuspid valve regurgitation is mild to moderate. No evidence of tricuspid stenosis. Aortic Valve: The aortic valve is tricuspid. There is moderate calcification of the aortic valve. There is moderate thickening of the aortic valve. Aortic valve regurgitation is mild. Aortic regurgitation PHT measures 513 msec. Aortic valve sclerosis/calcification is present, without any evidence of aortic stenosis. Pulmonic Valve: The pulmonic valve was normal in structure. Pulmonic valve regurgitation is mild. No evidence of pulmonic stenosis. Aorta: Aortic dilatation noted. There is mild dilatation of the ascending aorta, measuring 38 mm. Venous: The inferior vena cava is normal in size with greater than 50% respiratory variability, suggesting right atrial pressure of 3 mmHg. IAS/Shunts: No atrial level shunt detected by color flow Doppler. Additional Comments: A device lead is visualized.  LEFT VENTRICLE PLAX 2D LVIDd:         4.90 cm LVIDs:         3.10 cm   2D Longitudinal Strain LV PW:         1.00 cm   2D Strain GLS (A2C):   -23.6 % LV IVS:        1.00 cm   2D Strain GLS [facility]):   -17.5 % LVOT diam:     2.10 cm   2D Strain GLS (A4C):   -19.1 % LV SV:         74        2D Strain GLS Avg:      -20.1 % LV SV Index:   35 LVOT Area:     3.46 cm                           3D Volume EF:                          3D EF:        55 %                          LV EDV:       122 ml  LV ESV:       55 ml                          LV SV:        68 ml RIGHT VENTRICLE             IVC RV Basal diam:  4.40 cm     IVC diam: 1.80 cm RV Mid diam:    4.10 cm RV S prime:     15.20 cm/s TAPSE (M-mode): 2.4 cm RVSP:           47.9 mmHg LEFT ATRIUM             Index        RIGHT ATRIUM           Index LA diam:        5.10 cm 2.45 cm/m   RA Pressure: 3.00 mmHg LA Vol (A2C):   95.0 ml 45.62 ml/m  RA Area:     27.90 cm LA Vol (A4C):   92.1 ml 44.23 ml/m  RA Volume:   99.90 ml  47.97 ml/m LA Biplane Vol: 94.2 ml 45.24 ml/m  AORTIC VALVE LVOT Vmax:   91.10 cm/s LVOT Vmean:  62.700 cm/s LVOT VTI:    0.213 m AI PHT:      513 msec  AORTA Ao Root diam: 3.00 cm Ao Asc diam:  3.80 cm TRICUSPID VALVE TR Peak grad:   44.9 mmHg TR Vmax:        335.00 cm/s Estimated RAP:  3.00 mmHg RVSP:           47.9 mmHg  SHUNTS Systemic VTI:  0.21 m Systemic Diam: 2.10 cm Jenkins Rouge MD Electronically signed by Jenkins Rouge MD Signature Date/Time: 08/10/2021/3:38:40 PM    Final    DG HIP OPERATIVE UNILAT W OR W/O PELVIS RIGHT  Result Date: 08/14/2021 CLINICAL DATA:  Right hip arthroplasty EXAM: OPERATIVE right HIP (WITH PELVIS IF PERFORMED) AP VIEWS TECHNIQUE: Fluoroscopic spot image(s) were submitted for interpretation post-operatively. COMPARISON:  None. FINDINGS: Fluoroscopic images show degenerative changes in the right hip in the early images and evidence of right hip arthroplasty in the later images. No definite fractures are seen. Estimated radiation dose is 3.86 mGy. IMPRESSION: Fluoroscopic assistance was provided for right hip arthroplasty. Electronically Signed   By: Elmer Picker M.D.   On: 08/14/2021 11:02    Disposition: Discharge disposition: 01-Home or Self Care       Discharge Instructions      Call MD / Call 911   Complete by: As directed    If you experience chest pain or shortness of breath, CALL 911 and be transported to the hospital emergency room.  If you develope a fever above 101 F, pus (white drainage) or increased drainage or redness at the wound, or calf pain, call your surgeon's office.   Call MD / Call 911   Complete by: As directed    If you experience chest pain or shortness of breath, CALL 911 and be transported to the hospital emergency room.  If you develope a fever above 101 F, pus (white drainage) or increased drainage or redness at the wound, or calf pain, call your surgeon's office.   Constipation Prevention   Complete by: As directed    Drink plenty of fluids.  Prune juice may be helpful.  You may use a stool softener, such as Colace (over the counter) 100  mg twice a day.  Use MiraLax (over the counter) for constipation as needed.   Constipation Prevention   Complete by: As directed    Drink plenty of fluids.  Prune juice may be helpful.  You may use a stool softener, such as Colace (over the counter) 100 mg twice a day.  Use MiraLax (over the counter) for constipation as needed.   Diet - low sodium heart healthy   Complete by: As directed    Diet - low sodium heart healthy   Complete by: As directed    Discharge instructions   Complete by: As directed    INSTRUCTIONS AFTER JOINT REPLACEMENT   Remove items at home which could result in a fall. This includes throw rugs or furniture in walking pathways ICE to the affected joint every three hours while awake for 30 minutes at a time, for at least the first 3-5 days, and then as needed for pain and swelling.  Continue to use ice for pain and swelling. You may notice swelling that will progress down to the foot and ankle.  This is normal after surgery.  Elevate your leg when you are not up walking on it.   Continue to use the breathing machine you got in the hospital (incentive spirometer) which will help keep  your temperature down.  It is common for your temperature to cycle up and down following surgery, especially at night when you are not up moving around and exerting yourself.  The breathing machine keeps your lungs expanded and your temperature down.   DIET:  As you were doing prior to hospitalization, we recommend a well-balanced diet.  DRESSING / WOUND CARE / SHOWERING  You may shower 3 days after surgery, but keep the wounds dry during showering.  You may use an occlusive plastic wrap (Press'n Seal for example), NO SOAKING/SUBMERGING IN THE BATHTUB.  If the bandage gets wet, change with a clean dry gauze.  If the incision gets wet, pat the wound dry with a clean towel.  ACTIVITY  Increase activity slowly as tolerated, but follow the weight bearing instructions below.   No driving for 6 weeks or until further direction given by your physician.  You cannot drive while taking narcotics.  No lifting or carrying greater than 10 lbs. until further directed by your surgeon. Avoid periods of inactivity such as sitting longer than an hour when not asleep. This helps prevent blood clots.  You may return to work once you are authorized by your doctor.     WEIGHT BEARING   Weight bearing as tolerated with assist device (walker, cane, etc) as directed, use it as long as suggested by your surgeon or therapist, typically at least 4-6 weeks.   EXERCISES  Results after joint replacement surgery are often greatly improved when you follow the exercise, range of motion and muscle strengthening exercises prescribed by your doctor. Safety measures are also important to protect the joint from further injury. Any time any of these exercises cause you to have increased pain or swelling, decrease what you are doing until you are comfortable again and then slowly increase them. If you have problems or questions, call your caregiver or physical therapist for advice.   Rehabilitation is important following a joint  replacement. After just a few days of immobilization, the muscles of the leg can become weakened and shrink (atrophy).  These exercises are designed to build up the tone and strength of the thigh and leg muscles and to improve  motion. Often times heat used for twenty to thirty minutes before working out will loosen up your tissues and help with improving the range of motion but do not use heat for the first two weeks following surgery (sometimes heat can increase post-operative swelling).   These exercises can be done on a training (exercise) mat, on the floor, on a table or on a bed. Use whatever works the best and is most comfortable for you.    Use music or television while you are exercising so that the exercises are a pleasant break in your day. This will make your life better with the exercises acting as a break in your routine that you can look forward to.   Perform all exercises about fifteen times, three times per day or as directed.  You should exercise both the operative leg and the other leg as well.  Exercises include:   Quad Sets - Tighten up the muscle on the front of the thigh (Quad) and hold for 5-10 seconds.   Straight Leg Raises - With your knee straight (if you were given a brace, keep it on), lift the leg to 60 degrees, hold for 3 seconds, and slowly lower the leg.  Perform this exercise against resistance later as your leg gets stronger.  Leg Slides: Lying on your back, slowly slide your foot toward your buttocks, bending your knee up off the floor (only go as far as is comfortable). Then slowly slide your foot back down until your leg is flat on the floor again.  Angel Wings: Lying on your back spread your legs to the side as far apart as you can without causing discomfort.  Hamstring Strength:  Lying on your back, push your heel against the floor with your leg straight by tightening up the muscles of your buttocks.  Repeat, but this time bend your knee to a comfortable angle, and  push your heel against the floor.  You may put a pillow under the heel to make it more comfortable if necessary.   A rehabilitation program following joint replacement surgery can speed recovery and prevent re-injury in the future due to weakened muscles. Contact your doctor or a physical therapist for more information on knee rehabilitation.    CONSTIPATION  Constipation is defined medically as fewer than three stools per week and severe constipation as less than one stool per week.  Even if you have a regular bowel pattern at home, your normal regimen is likely to be disrupted due to multiple reasons following surgery.  Combination of anesthesia, postoperative narcotics, change in appetite and fluid intake all can affect your bowels.   YOU MUST use at least one of the following options; they are listed in order of increasing strength to get the job done.  They are all available over the counter, and you may need to use some, POSSIBLY even all of these options:    Drink plenty of fluids (prune juice may be helpful) and high fiber foods Colace 100 mg by mouth twice a day  Senokot for constipation as directed and as needed Dulcolax (bisacodyl), take with full glass of water  Miralax (polyethylene glycol) once or twice a day as needed.  If you have tried all these things and are unable to have a bowel movement in the first 3-4 days after surgery call either your surgeon or your primary doctor.    If you experience loose stools or diarrhea, hold the medications until you stool forms back up.  If your symptoms do not get better within 1 week or if they get worse, check with your doctor.  If you experience "the worst abdominal pain ever" or develop nausea or vomiting, please contact the office immediately for further recommendations for treatment.   ITCHING:  If you experience itching with your medications, try taking only a single pain pill, or even half a pain pill at a time.  You can also use  Benadryl over the counter for itching or also to help with sleep.   TED HOSE STOCKINGS:  Use stockings on both legs until for at least 2 weeks or as directed by physician office. They may be removed at night for sleeping.  MEDICATIONS:  See your medication summary on the "After Visit Summary" that nursing will review with you.  You may have some home medications which will be placed on hold until you complete the course of blood thinner medication.  It is important for you to complete the blood thinner medication as prescribed.  PRECAUTIONS:  If you experience chest pain or shortness of breath - call 911 immediately for transfer to the hospital emergency department.   If you develop a fever greater that 101 F, purulent drainage from wound, increased redness or drainage from wound, foul odor from the wound/dressing, or calf pain - CONTACT YOUR SURGEON.                                                   FOLLOW-UP APPOINTMENTS:  If you do not already have a post-op appointment, please call the office for an appointment to be seen by your surgeon.  Guidelines for how soon to be seen are listed in your "After Visit Summary", but are typically between 1-4 weeks after surgery.  OTHER INSTRUCTIONS:   Knee Replacement:  Do not place pillow under knee, focus on keeping the knee straight while resting. CPM instructions: 0-90 degrees, 2 hours in the morning, 2 hours in the afternoon, and 2 hours in the evening. Place foam block, curve side up under heel at all times except when in CPM or when walking.  DO NOT modify, tear, cut, or change the foam block in any way.  POST-OPERATIVE OPIOID TAPER INSTRUCTIONS: It is important to wean off of your opioid medication as soon as possible. If you do not need pain medication after your surgery it is ok to stop day one. Opioids include: Codeine, Hydrocodone(Norco, Vicodin), Oxycodone(Percocet, oxycontin) and hydromorphone amongst others.  Long term and even short term  use of opiods can cause: Increased pain response Dependence Constipation Depression Respiratory depression And more.  Withdrawal symptoms can include Flu like symptoms Nausea, vomiting And more Techniques to manage these symptoms Hydrate well Eat regular healthy meals Stay active Use relaxation techniques(deep breathing, meditating, yoga) Do Not substitute Alcohol to help with tapering If you have been on opioids for less than two weeks and do not have pain than it is ok to stop all together.  Plan to wean off of opioids This plan should start within one week post op of your joint replacement. Maintain the same interval or time between taking each dose and first decrease the dose.  Cut the total daily intake of opioids by one tablet each day Next start to increase the time between doses. The last dose that should be eliminated is the evening  dose.     MAKE SURE YOU:  Understand these instructions.  Get help right away if you are not doing well or get worse.    Thank you for letting us be a part of your medical care team.  It is a privilege we respect greatly.  We hope these instructions will help you stay on track for a fast and full recovery!   Discharge instructions   Complete by: As directed    INSTRUCTIONS AFTER JOINT REPLACEMENT   Remove items at home which could result in a fall. This includes throw rugs or furniture in walking pathways ICE to the affected joint every three hours while awake for 30 minutes at a time, for at least the first 3-5 days, and then as needed for pain and swelling.  Continue to use ice for pain and swelling. You may notice swelling that will progress down to the foot and ankle.  This is normal after surgery.  Elevate your leg when you are not up walking on it.   Continue to use the breathing machine you got in the hospital (incentive spirometer) which will help keep your temperature down.  It is common for your temperature to cycle up and down  following surgery, especially at night when you are not up moving around and exerting yourself.  The breathing machine keeps your lungs expanded and your temperature down.   DIET:  As you were doing prior to hospitalization, we recommend a well-balanced diet.  DRESSING / WOUND CARE / SHOWERING  You may shower 3 days after surgery, but keep the wounds dry during showering.  You may use an occlusive plastic wrap (Press'n Seal for example), NO SOAKING/SUBMERGING IN THE BATHTUB.  If the bandage gets wet, change with a clean dry gauze.  If the incision gets wet, pat the wound dry with a clean towel.  ACTIVITY  Increase activity slowly as tolerated, but follow the weight bearing instructions below.   No driving for 6 weeks or until further direction given by your physician.  You cannot drive while taking narcotics.  No lifting or carrying greater than 10 lbs. until further directed by your surgeon. Avoid periods of inactivity such as sitting longer than an hour when not asleep. This helps prevent blood clots.  You may return to work once you are authorized by your doctor.     WEIGHT BEARING   Weight bearing as tolerated with assist device (walker, cane, etc) as directed, use it as long as suggested by your surgeon or therapist, typically at least 4-6 weeks.   EXERCISES  Results after joint replacement surgery are often greatly improved when you follow the exercise, range of motion and muscle strengthening exercises prescribed by your doctor. Safety measures are also important to protect the joint from further injury. Any time any of these exercises cause you to have increased pain or swelling, decrease what you are doing until you are comfortable again and then slowly increase them. If you have problems or questions, call your caregiver or physical therapist for advice.   Rehabilitation is important following a joint replacement. After just a few days of immobilization, the muscles of the leg  can become weakened and shrink (atrophy).  These exercises are designed to build up the tone and strength of the thigh and leg muscles and to improve motion. Often times heat used for twenty to thirty minutes before working out will loosen up your tissues and help with improving the range of motion but do  not use heat for the first two weeks following surgery (sometimes heat can increase post-operative swelling).   These exercises can be done on a training (exercise) mat, on the floor, on a table or on a bed. Use whatever works the best and is most comfortable for you.    Use music or television while you are exercising so that the exercises are a pleasant break in your day. This will make your life better with the exercises acting as a break in your routine that you can look forward to.   Perform all exercises about fifteen times, three times per day or as directed.  You should exercise both the operative leg and the other leg as well.  Exercises include:   Quad Sets - Tighten up the muscle on the front of the thigh (Quad) and hold for 5-10 seconds.   Straight Leg Raises - With your knee straight (if you were given a brace, keep it on), lift the leg to 60 degrees, hold for 3 seconds, and slowly lower the leg.  Perform this exercise against resistance later as your leg gets stronger.  Leg Slides: Lying on your back, slowly slide your foot toward your buttocks, bending your knee up off the floor (only go as far as is comfortable). Then slowly slide your foot back down until your leg is flat on the floor again.  Angel Wings: Lying on your back spread your legs to the side as far apart as you can without causing discomfort.  Hamstring Strength:  Lying on your back, push your heel against the floor with your leg straight by tightening up the muscles of your buttocks.  Repeat, but this time bend your knee to a comfortable angle, and push your heel against the floor.  You may put a pillow under the heel to make  it more comfortable if necessary.   A rehabilitation program following joint replacement surgery can speed recovery and prevent re-injury in the future due to weakened muscles. Contact your doctor or a physical therapist for more information on knee rehabilitation.    CONSTIPATION  Constipation is defined medically as fewer than three stools per week and severe constipation as less than one stool per week.  Even if you have a regular bowel pattern at home, your normal regimen is likely to be disrupted due to multiple reasons following surgery.  Combination of anesthesia, postoperative narcotics, change in appetite and fluid intake all can affect your bowels.   YOU MUST use at least one of the following options; they are listed in order of increasing strength to get the job done.  They are all available over the counter, and you may need to use some, POSSIBLY even all of these options:    Drink plenty of fluids (prune juice may be helpful) and high fiber foods Colace 100 mg by mouth twice a day  Senokot for constipation as directed and as needed Dulcolax (bisacodyl), take with full glass of water  Miralax (polyethylene glycol) once or twice a day as needed.  If you have tried all these things and are unable to have a bowel movement in the first 3-4 days after surgery call either your surgeon or your primary doctor.    If you experience loose stools or diarrhea, hold the medications until you stool forms back up.  If your symptoms do not get better within 1 week or if they get worse, check with your doctor.  If you experience "the worst abdominal pain ever"  or develop nausea or vomiting, please contact the office immediately for further recommendations for treatment.   ITCHING:  If you experience itching with your medications, try taking only a single pain pill, or even half a pain pill at a time.  You can also use Benadryl over the counter for itching or also to help with sleep.   TED HOSE  STOCKINGS:  Use stockings on both legs until for at least 2 weeks or as directed by physician office. They may be removed at night for sleeping.  MEDICATIONS:  See your medication summary on the "After Visit Summary" that nursing will review with you.  You may have some home medications which will be placed on hold until you complete the course of blood thinner medication.  It is important for you to complete the blood thinner medication as prescribed.  PRECAUTIONS:  If you experience chest pain or shortness of breath - call 911 immediately for transfer to the hospital emergency department.   If you develop a fever greater that 101 F, purulent drainage from wound, increased redness or drainage from wound, foul odor from the wound/dressing, or calf pain - CONTACT YOUR SURGEON.                                                   FOLLOW-UP APPOINTMENTS:  If you do not already have a post-op appointment, please call the office for an appointment to be seen by your surgeon.  Guidelines for how soon to be seen are listed in your "After Visit Summary", but are typically between 1-4 weeks after surgery.  OTHER INSTRUCTIONS:   Knee Replacement:  Do not place pillow under knee, focus on keeping the knee straight while resting. CPM instructions: 0-90 degrees, 2 hours in the morning, 2 hours in the afternoon, and 2 hours in the evening. Place foam block, curve side up under heel at all times except when in CPM or when walking.  DO NOT modify, tear, cut, or change the foam block in any way.  POST-OPERATIVE OPIOID TAPER INSTRUCTIONS: It is important to wean off of your opioid medication as soon as possible. If you do not need pain medication after your surgery it is ok to stop day one. Opioids include: Codeine, Hydrocodone(Norco, Vicodin), Oxycodone(Percocet, oxycontin) and hydromorphone amongst others.  Long term and even short term use of opiods can cause: Increased pain  response Dependence Constipation Depression Respiratory depression And more.  Withdrawal symptoms can include Flu like symptoms Nausea, vomiting And more Techniques to manage these symptoms Hydrate well Eat regular healthy meals Stay active Use relaxation techniques(deep breathing, meditating, yoga) Do Not substitute Alcohol to help with tapering If you have been on opioids for less than two weeks and do not have pain than it is ok to stop all together.  Plan to wean off of opioids This plan should start within one week post op of your joint replacement. Maintain the same interval or time between taking each dose and first decrease the dose.  Cut the total daily intake of opioids by one tablet each day Next start to increase the time between doses. The last dose that should be eliminated is the evening dose.     MAKE SURE YOU:  Understand these instructions.  Get help right away if you are not doing well or get worse.  Thank you for letting us be a part of your medical care team.  It is a privilege we respect greatly.  We hope these instructions will help you stay on track for a fast and full recovery!   Increase activity slowly as tolerated   Complete by: As directed    Increase activity slowly as tolerated   Complete by: As directed    Post-operative opioid taper instructions:   Complete by: As directed    POST-OPERATIVE OPIOID TAPER INSTRUCTIONS: It is important to wean off of your opioid medication as soon as possible. If you do not need pain medication after your surgery it is ok to stop day one. Opioids include: Codeine, Hydrocodone(Norco, Vicodin), Oxycodone(Percocet, oxycontin) and hydromorphone amongst others.  Long term and even short term use of opiods can cause: Increased pain response Dependence Constipation Depression Respiratory depression And more.  Withdrawal symptoms can include Flu like symptoms Nausea, vomiting And more Techniques to manage  these symptoms Hydrate well Eat regular healthy meals Stay active Use relaxation techniques(deep breathing, meditating, yoga) Do Not substitute Alcohol to help with tapering If you have been on opioids for less than two weeks and do not have pain than it is ok to stop all together.  Plan to wean off of opioids This plan should start within one week post op of your joint replacement. Maintain the same interval or time between taking each dose and first decrease the dose.  Cut the total daily intake of opioids by one tablet each day Next start to increase the time between doses. The last dose that should be eliminated is the evening dose.      Post-operative opioid taper instructions:   Complete by: As directed    POST-OPERATIVE OPIOID TAPER INSTRUCTIONS: It is important to wean off of your opioid medication as soon as possible. If you do not need pain medication after your surgery it is ok to stop day one. Opioids include: Codeine, Hydrocodone(Norco, Vicodin), Oxycodone(Percocet, oxycontin) and hydromorphone amongst others.  Long term and even short term use of opiods can cause: Increased pain response Dependence Constipation Depression Respiratory depression And more.  Withdrawal symptoms can include Flu like symptoms Nausea, vomiting And more Techniques to manage these symptoms Hydrate well Eat regular healthy meals Stay active Use relaxation techniques(deep breathing, meditating, yoga) Do Not substitute Alcohol to help with tapering If you have been on opioids for less than two weeks and do not have pain than it is ok to stop all together.  Plan to wean off of opioids This plan should start within one week post op of your joint replacement. Maintain the same interval or time between taking each dose and first decrease the dose.  Cut the total daily intake of opioids by one tablet each day Next start to increase the time between doses. The last dose that should be  eliminated is the evening dose.           Follow-up Information     Melrose Nakayama, MD. Go on 08/24/2021.   Specialty: Orthopedic Surgery Why: Your appointment is scheduled for 9:15 Contact information: [place] [address] [phone]         [facility] Follow up.   Why: [facility] will contact you regarding the start date and time for your therapy. They will see you in your apartment to begin                 Signed: Larwance Sachs Tiah Heckel 08/15/2021, 2:52  PM

## 2021-08-15 NOTE — Evaluation (Signed)
Physical Therapy Evaluation Patient Details Name: Brittany [name] MRN: [id number] DOB: [birth year] Today's Date: 08/15/2021  History of Present Illness  88 y.o. female admitted 08/14/21 for R AA-THA, postop tachycardia. PMH includes: afib, HTN, pacemaker, CVA, back surgery, L TKA July 2022, L ankle fx s/p ORIF 2016, R RCR 2016.  Clinical Impression  Pt is s/p THA resulting in the deficits listed below (see PT Problem List). Pt ambulated 120' with RW, HR 95 with ambulation, no loss of balance. She demonstrates good understanding of HEP and is ready to DC home from a PT standpoint.  Pt will benefit from skilled PT to increase their independence and safety with mobility to allow discharge to the venue listed below.         Recommendations for follow up therapy are one component of a multi-disciplinary discharge planning process, led by the attending physician.  Recommendations may be updated based on patient status, additional functional criteria and insurance authorization.  Follow Up Recommendations Follow physician's recommendations for discharge plan and follow up therapies    Assistance Recommended at Discharge Intermittent Supervision/Assistance  Patient can return home with the following  Assistance with cooking/housework;Assist for transportation;Help with stairs or ramp for entrance;A little help with bathing/dressing/bathroom    Equipment Recommendations None recommended by PT  Recommendations for Other Services       Functional Status Assessment Patient has had a recent decline in their functional status and demonstrates the ability to make significant improvements in function in a reasonable and predictable amount of time.     Precautions / Restrictions Precautions Precautions: Fall Restrictions Weight Bearing Restrictions: No Other Position/Activity Restrictions: WBAT      Mobility  Bed Mobility Overal bed mobility: Modified Independent             General bed  mobility comments: HOB up, used rail    Transfers Overall transfer level: Needs assistance Equipment used: Rolling walker (2 wheels) Transfers: Sit to/from Stand Sit to Stand: Supervision, From elevated surface           General transfer comment: VCs hand placement    Ambulation/Gait Ambulation/Gait assistance: Supervision Gait Distance (Feet): 120 Feet Assistive device: Rolling walker (2 wheels) Gait Pattern/deviations: Step-to pattern, Decreased step length - right, Decreased step length - left Gait velocity: decr     General Gait Details: steady with RW, no loss of balance, VCs sequencing, HR 95 with walking  Stairs            Wheelchair Mobility    Modified Rankin (Stroke Patients Only)       Balance Overall balance assessment: Modified Independent                                           Pertinent Vitals/Pain Pain Assessment Pain Assessment: 0-10 Pain Score: 2  Pain Location: R hip Pain Descriptors / Indicators: Sore Pain Intervention(s): Limited activity within patient's tolerance, Monitored during session, Repositioned, Ice applied    Home Living Family/patient expects to be discharged to:: Private residence Living Arrangements: Spouse/significant other Available Help at Discharge: Family;Available 24 hours/day Type of Home: Apartment Home Access: Level entry       Home Layout: One level Home Equipment: Conservation officer, nature (2 wheels);Rollator (4 wheels);Grab bars - tub/shower;Grab bars - toilet;Hand held shower head;BSC/3in1;Cane - single point Additional Comments: [facility]    Prior Function Prior Level of Function :  Independent/Modified Independent             Mobility Comments: used RW or SPC PTA; one fall in past 6 months occurred in bathroom after her L TKA ADLs Comments: independent     Hand Dominance   Dominant Hand: Right    Extremity/Trunk Assessment        Lower Extremity  Assessment Lower Extremity Assessment: RLE deficits/detail RLE Deficits / Details: knee ext 3/5, hip AAROM WFL RLE Sensation: WNL RLE Coordination: WNL    Cervical / Trunk Assessment Cervical / Trunk Assessment: Normal  Communication   Communication: No difficulties  Cognition Arousal/Alertness: Awake/alert Behavior During Therapy: WFL for tasks assessed/performed Overall Cognitive Status: Within Functional Limits for tasks assessed                                          General Comments      Exercises Total Joint Exercises Ankle Circles/Pumps: AROM, Both, 10 reps, Supine Quad Sets: AROM, Both, 5 reps, Supine Short Arc Quad: AROM, Right, 5 reps, Supine Heel Slides: AAROM, Right, 5 reps, Supine Hip ABduction/ADduction: AAROM, Right, 5 reps, Supine Long Arc Quad: AROM, Right, 5 reps, Seated   Assessment/Plan    PT Assessment All further PT needs can be met in the next venue of care  PT Problem List Decreased strength;Decreased mobility;Pain;Decreased activity tolerance       PT Treatment Interventions      PT Goals (Current goals can be found in the Care Plan section)  Acute Rehab PT Goals PT Goal Formulation: All assessment and education complete, DC therapy    Frequency       Co-evaluation               AM-PAC PT "6 Clicks" Mobility  Outcome Measure Help needed turning from your back to your side while in a flat bed without using bedrails?: A Little Help needed moving from lying on your back to sitting on the side of a flat bed without using bedrails?: A Little Help needed moving to and from a bed to a chair (including a wheelchair)?: A Little Help needed standing up from a chair using your arms (e.g., wheelchair or bedside chair)?: A Little Help needed to walk in hospital room?: A Little Help needed climbing 3-5 steps with a railing? : A Little 6 Click Score: 18    End of Session Equipment Utilized During Treatment: Gait  belt Activity Tolerance: Patient tolerated treatment well Patient left: in chair;with call bell/phone within reach;with chair alarm set Nurse Communication: Mobility status PT Visit Diagnosis: Difficulty in walking, not elsewhere classified (R26.2);Pain Pain - Right/Left: Right Pain - part of body: Hip    Time: 0932-1016 PT Time Calculation (min) (ACUTE ONLY): 44 min   Charges:   PT Evaluation $PT Eval Moderate Complexity: 1 Mod PT Treatments $Gait Training: 8-22 mins $Therapeutic Exercise: 8-22 mins       Blondell Reveal Kistler PT 08/15/2021  Acute Rehabilitation Services Pager [phone] Office [phone]

## 2021-08-15 NOTE — Progress Notes (Signed)
Subjective: 1 Day Post-Op Procedure(s) (LRB): TOTAL HIP ARTHROPLASTY ANTERIOR APPROACH (Right)  Patient resting comfortably in bed this morning. No real pain. Her Hgb is 8.1 but she feels well. She has not yet gotten up with PT.  Activity level:  wbat Diet tolerance:  ok Voiding:  ok Patient reports pain as mild.    Objective: Vital signs in last 24 hours: Temp:  [97.5 F (36.4 C)-98 F (36.7 C)] 97.5 F (36.4 C) (01/25 0413) Pulse Rate:  [61-93] 65 (01/25 0413) Resp:  [11-19] 18 (01/25 0413) BP: (90-151)/(50-84) 137/59 (01/25 0413) SpO2:  [89 %-100 %] 95 % (01/25 0413)  Labs: Recent Labs    08/15/21 0439  HGB 8.1*   Recent Labs    08/15/21 0439  WBC 13.2*  RBC 2.63*  HCT 25.4*  PLT 209   Recent Labs    08/15/21 0439  NA 131*  K 4.1  CL 100  CO2 25  BUN 25*  CREATININE 0.83  GLUCOSE 147*  CALCIUM 9.2   No results for input(s): LABPT, INR in the last 72 hours.  Physical Exam:  Neurologically intact ABD soft Neurovascular intact Sensation intact distally Intact pulses distally Dorsiflexion/Plantar flexion intact Incision: dressing C/D/I and no drainage No cellulitis present Compartment soft  Assessment/Plan:  1 Day Post-Op Procedure(s) (LRB): TOTAL HIP ARTHROPLASTY ANTERIOR APPROACH (Right) Advance diet Up with therapy Discharge home with home health possibly this after noon if she is doing well and passes PT. We are going to give her 1 unit of blood as her Hgb is 8.1.  It is still ok for her to get up with PT. I will call and check on her this afternoon. If she is feeling well and passes PT then she could possibly go home this after noon. If she is not quite ready to go then we will plan on D/C tomorrow.  We will resume her baseline eliquis today.  I will continue to follow closely.  Larwance Sachs Meg Niemeier 08/15/2021, 7:55 AM

## 2021-08-16 DIAGNOSIS — R2689 Other abnormalities of gait and mobility: Secondary | ICD-10-CM | POA: Diagnosis not present

## 2021-08-16 DIAGNOSIS — R000 Tachycardia, unspecified: Secondary | ICD-10-CM | POA: Diagnosis not present

## 2021-08-16 DIAGNOSIS — M6281 Muscle weakness (generalized): Secondary | ICD-10-CM | POA: Diagnosis not present

## 2021-08-16 DIAGNOSIS — I482 Chronic atrial fibrillation, unspecified: Secondary | ICD-10-CM | POA: Diagnosis not present

## 2021-08-16 DIAGNOSIS — I4891 Unspecified atrial fibrillation: Secondary | ICD-10-CM | POA: Diagnosis not present

## 2021-08-16 DIAGNOSIS — M1990 Unspecified osteoarthritis, unspecified site: Secondary | ICD-10-CM | POA: Diagnosis not present

## 2021-08-16 DIAGNOSIS — Z471 Aftercare following joint replacement surgery: Secondary | ICD-10-CM | POA: Diagnosis not present

## 2021-08-16 LAB — TYPE AND SCREEN
ABO/RH(D): A POS
Antibody Screen: NEGATIVE
Unit division: 0

## 2021-08-16 LAB — BPAM RBC
Blood Product Expiration Date: 202302082359
ISSUE DATE / TIME: 202301251116
Unit Type and Rh: 6200

## 2021-08-17 DIAGNOSIS — I482 Chronic atrial fibrillation, unspecified: Secondary | ICD-10-CM | POA: Diagnosis not present

## 2021-08-17 DIAGNOSIS — R2689 Other abnormalities of gait and mobility: Secondary | ICD-10-CM | POA: Diagnosis not present

## 2021-08-17 DIAGNOSIS — Z471 Aftercare following joint replacement surgery: Secondary | ICD-10-CM | POA: Diagnosis not present

## 2021-08-17 DIAGNOSIS — M6281 Muscle weakness (generalized): Secondary | ICD-10-CM | POA: Diagnosis not present

## 2021-08-17 DIAGNOSIS — M1990 Unspecified osteoarthritis, unspecified site: Secondary | ICD-10-CM | POA: Diagnosis not present

## 2021-08-19 ENCOUNTER — Other Ambulatory Visit: Payer: Self-pay | Admitting: Internal Medicine

## 2021-08-20 DIAGNOSIS — M1990 Unspecified osteoarthritis, unspecified site: Secondary | ICD-10-CM | POA: Diagnosis not present

## 2021-08-20 DIAGNOSIS — R2689 Other abnormalities of gait and mobility: Secondary | ICD-10-CM | POA: Diagnosis not present

## 2021-08-20 DIAGNOSIS — I482 Chronic atrial fibrillation, unspecified: Secondary | ICD-10-CM | POA: Diagnosis not present

## 2021-08-20 DIAGNOSIS — Z471 Aftercare following joint replacement surgery: Secondary | ICD-10-CM | POA: Diagnosis not present

## 2021-08-20 DIAGNOSIS — M6281 Muscle weakness (generalized): Secondary | ICD-10-CM | POA: Diagnosis not present

## 2021-08-21 DIAGNOSIS — I10 Essential (primary) hypertension: Secondary | ICD-10-CM | POA: Diagnosis not present

## 2021-08-21 DIAGNOSIS — M1990 Unspecified osteoarthritis, unspecified site: Secondary | ICD-10-CM | POA: Diagnosis not present

## 2021-08-21 DIAGNOSIS — M6281 Muscle weakness (generalized): Secondary | ICD-10-CM | POA: Diagnosis not present

## 2021-08-21 DIAGNOSIS — I482 Chronic atrial fibrillation, unspecified: Secondary | ICD-10-CM | POA: Diagnosis not present

## 2021-08-21 DIAGNOSIS — R2689 Other abnormalities of gait and mobility: Secondary | ICD-10-CM | POA: Diagnosis not present

## 2021-08-21 DIAGNOSIS — Z471 Aftercare following joint replacement surgery: Secondary | ICD-10-CM | POA: Diagnosis not present

## 2021-08-22 ENCOUNTER — Ambulatory Visit (INDEPENDENT_AMBULATORY_CARE_PROVIDER_SITE_OTHER): Payer: Medicare Other

## 2021-08-22 ENCOUNTER — Encounter: Payer: Self-pay | Admitting: Nurse Practitioner

## 2021-08-22 ENCOUNTER — Other Ambulatory Visit: Payer: Self-pay

## 2021-08-22 ENCOUNTER — Ambulatory Visit (INDEPENDENT_AMBULATORY_CARE_PROVIDER_SITE_OTHER): Payer: Medicare Other | Admitting: Nurse Practitioner

## 2021-08-22 VITALS — BP 102/60 | HR 82 | Temp 98.6°F | Ht 68.0 in | Wt 208.0 lb

## 2021-08-22 DIAGNOSIS — I5030 Unspecified diastolic (congestive) heart failure: Secondary | ICD-10-CM | POA: Diagnosis not present

## 2021-08-22 DIAGNOSIS — R600 Localized edema: Secondary | ICD-10-CM

## 2021-08-22 DIAGNOSIS — Z9989 Dependence on other enabling machines and devices: Secondary | ICD-10-CM

## 2021-08-22 DIAGNOSIS — R0602 Shortness of breath: Secondary | ICD-10-CM

## 2021-08-22 DIAGNOSIS — G4733 Obstructive sleep apnea (adult) (pediatric): Secondary | ICD-10-CM

## 2021-08-22 DIAGNOSIS — Z87891 Personal history of nicotine dependence: Secondary | ICD-10-CM

## 2021-08-22 DIAGNOSIS — R0609 Other forms of dyspnea: Secondary | ICD-10-CM | POA: Insufficient documentation

## 2021-08-22 LAB — BASIC METABOLIC PANEL
BUN: 12 mg/dL (ref 6–23)
CO2: 26 mEq/L (ref 19–32)
Calcium: 9.8 mg/dL (ref 8.4–10.5)
Chloride: 90 mEq/L — ABNORMAL LOW (ref 96–112)
Creatinine, Ser: 0.66 mg/dL (ref 0.40–1.20)
GFR: 80.05 mL/min (ref >60.00)
Glucose, Bld: 109 mg/dL — ABNORMAL HIGH (ref 70–99)
Potassium: 4.1 mEq/L (ref 3.5–5.1)
Sodium: 125 mEq/L — ABNORMAL LOW (ref 135–145)

## 2021-08-22 LAB — CBC WITH DIFFERENTIAL/PLATELET
Basophils Absolute: 0 10*3/uL (ref 0.0–0.1)
Basophils Relative: 0.2 % (ref 0.0–3.0)
Eosinophils Absolute: 0.4 10*3/uL (ref 0.0–0.7)
Eosinophils Relative: 3.8 % (ref 0.0–5.0)
HCT: 26.4 % — ABNORMAL LOW (ref 36.0–46.0)
Hemoglobin: 8.8 g/dL — ABNORMAL LOW (ref 12.0–15.0)
Lymphocytes Relative: 8.4 % — ABNORMAL LOW (ref 12.0–46.0)
Lymphs Abs: 0.8 10*3/uL (ref 0.7–4.0)
MCHC: 33.3 g/dL (ref 30.0–36.0)
MCV: 94.4 fl (ref 78.0–100.0)
Monocytes Absolute: 1.3 10*3/uL — ABNORMAL HIGH (ref 0.1–1.0)
Monocytes Relative: 13.5 % — ABNORMAL HIGH (ref 3.0–12.0)
Neutro Abs: 7.4 10*3/uL (ref 1.4–7.7)
Neutrophils Relative %: 74.1 % (ref 43.0–77.0)
Platelets: 410 10*3/uL — ABNORMAL HIGH (ref 150.0–400.0)
RBC: 2.79 Mil/uL — ABNORMAL LOW (ref 3.87–5.11)
RDW: 14.4 % (ref 11.5–15.5)
WBC: 10 10*3/uL (ref 4.0–10.5)

## 2021-08-22 LAB — BRAIN NATRIURETIC PEPTIDE: Pro B Natriuretic peptide (BNP): 921 pg/mL — ABNORMAL HIGH (ref 0.0–100.0)

## 2021-08-22 MED ORDER — FUROSEMIDE 40 MG PO TABS: 40.0000 mg | ORAL_TABLET | Freq: Every day | ORAL | 0 refills | Status: DC

## 2021-08-22 NOTE — Progress Notes (Signed)
Spoke with Dr. Caryl Comes. Ok to do lasix with Tikosyn; not hctz. Rx for lasix 40 mg daily for 7 days. Take in AM. Pt instructed to monitor BP and notify if <100/60 or worsening dizziness occurs. Pt to call cardiology office to schedule acute visit. Verbalized understanding. Will f/u in 1 week with BMET in our office

## 2021-08-22 NOTE — Addendum Note (Signed)
Addended by: Clayton Bibles on: 08/22/2021 03:48 PM   Modules accepted: Orders

## 2021-08-22 NOTE — Progress Notes (Signed)
CXR with mild enlargement of cardiac silhouette. No acute cardiopulm findings. BNP elevated to 921 pg/mL. BMET significant for hyponatremia (125). CBC with persistent anemia (hgb 8.8). Pt is currently on Tikosyn so will reach out to cardiology, Dr. Caryl Comes, to advise on diuresis therapy. Advised pt to contact office for acute visit. Stop daily use DDAVP as it is supposed to be PRN for long car rides, per hx. Suspect hyponatremia r/t to hemodilution and DDAVP use. Persistent anemia also likely dilutional; however, advised f/u with PCP to ensure no evidence of GI bleed given chronically anticoagulated. Pt verbalized understanding.

## 2021-08-22 NOTE — Progress Notes (Signed)
Reviewed and agree with assessment/plan.   Chesley Mires, MD [facility] Pulmonary/Critical Care 08/22/2021, 10:23 PM Pager:  [phone]

## 2021-08-22 NOTE — Assessment & Plan Note (Signed)
Compliant with CPAP therapy and tolerating well. Having some issues with machine, which is >86 years old. Continue with CPAP at 8 cmH2O. Order for new CPAP sent to current DME.

## 2021-08-22 NOTE — Assessment & Plan Note (Addendum)
Worsening SOB with minimal exertion s/p L total hip. Was noted to be anemic post op requiring blood transfusion; CBC today. RLE edema, likely related to recent surgery; however, will check BNP and BMET for further eval. Continue TED hose. CXR today neg for acute process. Walking oximetry - desatted to 88% for <15 sec per CMA and recovered to high 90's without O2; suspect this could have been false reading. If unable to correlate SOB to existing or new etiology, may need to perform CTA since she was off of her Eliquis   Patient Instructions  Continue to use CPAP 8 cmH2O every night, minimum of 6 hours a night. Order placed for new machine Change equipment every 30 days or as directed by DME. Wash your tubing with warm soap and water daily, hang to dry. Wash humidifier portion weekly.  Maintain clean equipment, as directed by home health agency.  Be aware of reduced alertness and do not drive or operate heavy machinery if experiencing this or drowsiness.  Exercise encouraged, as tolerated. Healthy weight management discussed.  Avoid or decrease alcohol consumption and medications that make you more sleepy, if possible. Notify if persistent daytime sleepiness occurs even with consistent use of CPAP.  Chest x ray today without any areas of concern  Labs today - CBC with diff, BNP, BMET  Walking oximetry today with any decrease in your oxygen.  Continue wearing TED hose. Encouraged increased activity, as tolerated.  Use Incentive Spirometer 10x/hour while awake if not up moving around  Follow up in 2 weeks with Dr. Halford Chessman or Katie Sharon Stapel,NP. If symptoms do not improve or worsen, please contact office for sooner follow up or seek emergency care.

## 2021-08-22 NOTE — Patient Instructions (Addendum)
Continue to use CPAP 8 cmH2O every night, minimum of 6 hours a night. Order placed for new machine Change equipment every 30 days or as directed by DME. Wash your tubing with warm soap and water daily, hang to dry. Wash humidifier portion weekly.  Maintain clean equipment, as directed by home health agency.  Be aware of reduced alertness and do not drive or operate heavy machinery if experiencing this or drowsiness.  Exercise encouraged, as tolerated. Healthy weight management discussed.  Avoid or decrease alcohol consumption and medications that make you more sleepy, if possible. Notify if persistent daytime sleepiness occurs even with consistent use of CPAP.  Chest x ray today without any areas of concern  Labs today - CBC with diff, BNP, BMET  Walking oximetry today with any decrease in your oxygen.  Continue wearing TED hose. Encouraged increased activity, as tolerated.  Use Incentive Spirometer 10x/hour while awake if not up moving around  Follow up in 2 weeks with Dr. Halford Chessman or Katie Yuvraj Pfeifer,NP. If symptoms do not improve or worsen, please contact office for sooner follow up or seek emergency care.

## 2021-08-22 NOTE — Assessment & Plan Note (Signed)
Worsening MVR and new evidence of moderate pulm HTN on most recent echo. Likely contributing factors to SOB. No evidence of fluid overload on exam. BNP and BMET today.

## 2021-08-22 NOTE — Progress Notes (Addendum)
@Patient  ID: Brittany [name], female    DOB: 09/15/1933, 88 y.o.   MRN: [id number]  Chief Complaint  Patient presents with   Follow-up    SOBE Cpap compliance    Referring provider: Donald Prose, MD  HPI: 88 year old female, former smoker (45 pack years) followed for obstructive sleep apnea.  She is a patient of Dr. Juanetta Gosling and was last seen in office on 05/25/2021.  Past medical history significant for hypertension, A. fib on chronic anticoagulation, sinus node dysfunction s/p pacemaker, HFpEF, hyperparathyroidism, osteoarthritis, anxiety and depression, HLD.  TEST/EVENTS:  08/13/2012 PSG: AHI 24, SaO2 low 72%, CPAP 8 07/03/2014 PSG: AHI 24.4, SaO2 low 76%, PLM I 81.8, CPAP 8 -> AHI 0,+S 12/31/2017 echocardiogram: EF 55 to 60%, mild AR 08/10/2021 echocardiogram: EF 55-60%. G1DD. Moderately elevated PASP. LA and RA moderately dilated. Mild to moderate MVR. TVR mild to mod. Mild dilatation of ascending aorta.   05/25/2021: OV with Dr. Halford Chessman.  Follow-up for OSA.  Compliant with CPAP therapy; continued CPAP 8 cmH2O.   08/22/2021: Today - acute visit Patient presents today with husband for concern of shortness of breath. She had surgery on her left knee in July and noticed a slight increase in DOE after undergoing general anesthesia. She did not have any treatment for this and attributed it to her surgery. She recently had right hip replacement surgery on 1/24 and since, has had worsening shortness of breath, with minimal activity. She feels like it is slightly worse when she lays down but is unsure if this is from the exertion of laying down or actually lying flat. She wears her CPAP at night but feels like she has become more short of breath randomly throughout the night. She wears compression stockings on both legs and hasn't noticed much increase in swelling. She denies cough, wheezing, chest pain, or hemoptysis. She is on chronic anticoagulation therapy with Eliquis but was off of it for four days  for surgery. Her most recent echo did show a moderately elevated PASP, which is a new finding for her, and G1DD with reserved EF (55-60%). She was anemic during her most recent hospitalization and required a blood transfusion. She would like a new CPAP machine as she has had some difficulties with her's and it is >92 years old. Overall, she feels okay but is concerned about her shortness of breath.  CPAP Download: 8 cmH2O 28/30 days used (93% >4 hr). Av. 9 hours 18 min AHI 2.4  Allergies  Allergen Reactions   Adhesive [Tape] Swelling and Rash   Benzalkonium Chloride Rash    Pt was not aware of this allergy   Neosporin [Neomycin-Polymyxin-Gramicidin] Swelling and Rash    Immunization History  Administered Date(s) Administered   Fluad Quad(high Dose 65+) 04/25/2020   Influenza Split 07/01/2013, 04/21/2014, 04/27/2017, 04/12/2021   Influenza, High Dose Seasonal PF 04/12/2016, 04/22/2018, 04/22/2018, 04/22/2019, 04/25/2020, 05/03/2020, 05/03/2020   Influenza, Seasonal, Injecte, Preservative Fre 04/22/2013   Influenza,inj,Quad PF,6-35 Mos 05/05/2017   Influenza-Unspecified 04/22/2013, 07/01/2013, 04/21/2014, 05/06/2015, 05/05/2017, 04/21/2018   Moderna Covid-19 Vaccine Bivalent Booster 66yrs & up 04/12/2021   Moderna SARS-COV2 Booster Vaccination 05/24/2020   Moderna Sars-Covid-2 Vaccination 08/03/2019, 08/31/2019, 05/24/2020, 11/08/2020   PPD Test 11/30/2012, 01/01/2013   Pneumococcal Conjugate-13 05/18/2014   Pneumococcal Polysaccharide-23 07/23/2011, 10/28/2012   Td 10/28/2012   Tdap 07/22/2012, 10/28/2012    Past Medical History:  Diagnosis Date   Ankle fracture 2016   Arthritis    Atrial fibrillation ([place])    Depression  Dysrhythmia    Elevated parathyroid hormone    Fracture of orbital floor with routine healing    GERD (gastroesophageal reflux disease)    History of colon polyps    benign   Hypercalcemia    Hypertension    Hyponatremia    Hypothyroid    Macular  degeneration    wet in the right and dry in the left   OSA on CPAP    Osteoporosis    Peripheral vascular disease (HCC)    Presence of permanent cardiac pacemaker    PSVT (paroxysmal supraventricular tachycardia) (HCC)    Rotator cuff tear    Sinus node dysfunction (HCC)    a. s/p MDT pacemaker   Stroke [facility])    Urinary incontinence    Wrist fracture 2018    Tobacco History: Social History   Tobacco Use  Smoking Status Former   Packs/day: 1.00   Years: 45.00   Pack years: 45.00   Types: Cigarettes   Quit date: 1992   Years since quitting: 31.1  Smokeless Tobacco Never  Tobacco Comments   quit smoking in 1992   Counseling given: Not Answered Tobacco comments: quit smoking in 1992   Outpatient Medications Prior to Visit  Medication Sig Dispense Refill   acetaminophen (TYLENOL) 500 MG tablet Take 1,000 mg by mouth every 6 (six) hours as needed for moderate pain.     alendronate (FOSAMAX) 70 MG tablet Take 1 tablet (70 mg total) by mouth every 7 (seven) days. Take with a full glass of water on an empty stomach. 4 tablet 11   apixaban (ELIQUIS) 5 MG TABS tablet Take 1 tablet (5 mg total) by mouth 2 (two) times daily. 180 tablet 3   atorvastatin (LIPITOR) 40 MG tablet Take 1 tablet (40 mg total) by mouth daily at 6 PM. 30 tablet 3   B Complex-C (B-COMPLEX WITH VITAMIN C) tablet Take 1 tablet by mouth daily with lunch.      Cholecalciferol (VITAMIN D3) 5000 UNITS CAPS Take 5,000 Units by mouth daily with breakfast.      desmopressin (DDAVP) 0.2 MG tablet Take 0.2 mg by mouth daily as needed (to control bladder on car trip).      diltiazem (CARDIZEM CD) 120 MG 24 hr capsule take 1 capsule by mouth every morning and at bedtime 180 capsule 0   dofetilide (TIKOSYN) 500 MCG capsule TAKE ONE CAPSULE BY MOUTH TWICE DAILY 180 capsule 3   HYDROcodone-acetaminophen (NORCO/VICODIN) 5-325 MG tablet Take 1 tablet by mouth every 6 (six) hours as needed for moderate pain or severe pain (post  op pain). 30 tablet 0   Lutein 20 MG TABS Take 20 mg by mouth daily with breakfast.     magnesium oxide (MAG-OX) 400 MG tablet Take 1 tablet (400 mg total) by mouth daily. 30 tablet 0   Melatonin 10 MG TABS Take 10-40 mg by mouth at bedtime as needed (sleep).     Multiple Vitamin (MULTIVITAMIN WITH MINERALS) TABS tablet Take 1 tablet by mouth daily with breakfast.      Multiple Vitamins-Minerals (PRESERVISION AREDS 2 PO) Take 1 capsule by mouth 2 (two) times daily with a meal.      PRESCRIPTION MEDICATION Inhale into the lungs at bedtime. CPAP     tiZANidine (ZANAFLEX) 2 MG tablet Take 1-2 tablets (2-4 mg total) by mouth every 6 (six) hours as needed for muscle spasms. 40 tablet 1   lisinopril (ZESTRIL) 40 MG tablet Take 40 mg by mouth daily.  Patient taking 20mg      metoprolol succinate (TOPROL-XL) 50 MG 24 hr tablet Take 2 tablets by mouth twice daily with or immediately following meals (Patient taking differently: Take 100 mg by mouth in the morning and at bedtime.) 360 tablet 3   sertraline (ZOLOFT) 50 MG tablet Take 1 tablet (50 mg total) by mouth daily. 30 tablet 2   No facility-administered medications prior to visit.     Review of Systems:   Constitutional: No weight loss or gain, night sweats, fevers, chills. +fatigue HEENT: No headaches, difficulty swallowing, tooth/dental problems, or sore throat. No sneezing, itching, ear ache, nasal congestion, or post nasal drip CV:  +orthopnea, PND, dizziness at times. No chest pain, anasarca, palpitations, syncope Resp: +shortness of breath with minimal activity. No excess mucus or change in color of mucus. No productive or non-productive. No hemoptysis. No wheezing.  No chest wall deformity GI:  No heartburn, indigestion, abdominal pain, nausea, vomiting, diarrhea, change in bowel habits, loss of appetite, bloody stools.  GU: No dysuria, change in color of urine, urgency or frequency.  No flank pain, no hematuria  Skin: No rash, lesions,  ulcerations MSK:  No joint pain or swelling.  No decreased range of motion.  No back pain. Neuro: No dizziness or lightheadedness.  Psych: No depression or anxiety. Mood stable.     Physical Exam:  BP 102/60 (BP Location: Right Arm, Cuff Size: Normal)    Pulse 82    Temp 98.6 F (37 C) (Oral)    Ht 5\' 8"  (1.727 m)    Wt 208 lb (94.3 kg) Comment: pt reported   LMP 07/22/1968    SpO2 99%    BMI 31.63 kg/m   GEN: Pleasant, interactive, well-appearing; obese; in no acute distress. HEENT:  Normocephalic and atraumatic. EACs patent bilaterally. TM pearly gray with present light reflex bilaterally. PERRLA. Sclera white. Nasal turbinates pink, moist and patent bilaterally. No rhinorrhea present. Oropharynx pink and moist, without exudate or edema. No lesions, ulcerations, or postnasal drip.  NECK:  Supple w/ fair ROM. No JVD present. Normal carotid impulses w/o bruits. Thyroid symmetrical with no goiter or nodules palpated. No lymphadenopathy.   CV: RRR, no m/r/g. R ankle +1 pitting edema. Pulses intact, +2 bilaterally. No cyanosis, pallor or clubbing. PULMONARY:  Unlabored, regular breathing. Clear bilaterally A&P w/o wheezes/rales/rhonchi. No accessory muscle use. No dullness to percussion. GI: BS present and normoactive. Soft, non-tender to palpation. No organomegaly or masses detected. No CVA tenderness. MSK: No erythema, warmth or tenderness. Cap refil <2 sec all extrem. No deformities or joint swelling noted.  Neuro: A/Ox3. No focal deficits noted.   Skin: Warm, no lesions or rashe Psych: Normal affect and behavior. Judgement and thought content appropriate.     Lab Results:  CBC    Component Value Date/Time   WBC 10.0 08/22/2021 1232   RBC 2.79 (L) 08/22/2021 1232   HGB 8.8 Repeated and verified X2. (L) 08/22/2021 1232   HGB 13.4 07/30/2021 1656   HGB 14.5 10/28/2012 0000   HCT 26.4 (L) 08/22/2021 1232   HCT 39.8 07/30/2021 1656   PLT 410.0 (H) 08/22/2021 1232   PLT 329  07/30/2021 1656   PLT 273 07/24/2012 0807   MCV 94.4 08/22/2021 1232   MCV 92 07/30/2021 1656   MCH 30.8 08/15/2021 0439   MCHC 33.3 08/22/2021 1232   RDW 14.4 08/22/2021 1232   RDW 13.6 07/30/2021 1656   RDW 13.5 10/28/2012 1300   LYMPHSABS 0.8 08/22/2021 1232  LYMPHSABS 22 10/28/2012 1300   MONOABS 1.3 (H) 08/22/2021 1232   EOSABS 0.4 08/22/2021 1232   EOSABS 0 10/28/2012 1300   BASOSABS 0.0 08/22/2021 1232   BASOSABS 1 10/28/2012 1300    BMET    Component Value Date/Time   NA 125 (L) 08/22/2021 1232   NA 137 07/30/2021 1656   K 4.1 08/22/2021 1232   K 3.9 10/28/2012 1246   CL 90 (L) 08/22/2021 1232   CL 100 10/28/2012 1246   CO2 26 08/22/2021 1232   GLUCOSE 109 (H) 08/22/2021 1232   BUN 12 08/22/2021 1232   BUN 16 07/30/2021 1656   BUN 9 10/28/2012 1246   CREATININE 0.66 08/22/2021 1232   CREATININE 0.59 (L) 08/14/2016 0930   CALCIUM 9.8 08/22/2021 1232   CALCIUM 10.4 10/28/2012 1246   GFRNONAA >60 08/15/2021 0439   GFRNONAA 87 08/14/2016 0930   GFRAA 92 05/02/2020 1444   GFRAA >89 08/14/2016 0930    BNP No results found for: BNP   Imaging:  DG Chest 2 View  Result Date: 08/22/2021 CLINICAL DATA:  SOB post op EXAM: CHEST - 2 VIEW COMPARISON:  02/08/2021. FINDINGS: Left subclavian dual lead cardiac rhythm maintenance device in similar position. Similar mild enlargement the cardiac silhouette. No consolidation. No visible pleural effusions or pneumothorax. Polyarticular gist age. IMPRESSION: No evidence of acute cardiopulmonary disease. Electronically Signed   By: Margaretha Sheffield M.D.   On: 08/22/2021 12:01   DG C-Arm 1-60 Min-No Report  Result Date: 08/14/2021 Fluoroscopy was utilized by the requesting physician.  No radiographic interpretation.   ECHOCARDIOGRAM COMPLETE  Result Date: 08/10/2021    ECHOCARDIOGRAM REPORT   Patient Name:   JALAYSIA LOBB [facility] Date of Exam: 08/10/2021 Medical Rec #:  [id number]       Height:       68.0 in Accession #:     [id number]      Weight:       209.0 lb Date of Birth:  [birth year]       BSA:          2.082 m Patient Age:    85 years        BP:           112/68 mmHg Patient Gender: F               HR:           86 bpm. Exam Location:  [facility] Procedure: 2D Echo, 3D Echo, Cardiac Doppler, Color Doppler and Strain Analysis Indications:    R06.02 Shortness of breath  History:        Patient has prior history of Echocardiogram examinations, most                 recent 12/31/2017. CHF, Pacemaker, Stroke, Arrythmias:Atrial                 Fibrillation and Bradycardia; Risk Factors:Hypertension, Sleep                 Apnea, Dyslipidemia and Former Smoker. Sinus node dysfunction.                 Obesity.  Sonographer:    Basilia Jumbo BS, RDCS Referring Phys: [place]  1. Left ventricular ejection fraction, by estimation, is 55 to 60%. The left ventricle has normal function. The left ventricle has no regional wall motion abnormalities. There is mild asymmetric left ventricular hypertrophy of the basal and septal segments.  Left ventricular diastolic parameters are consistent with Grade I diastolic dysfunction (impaired relaxation). The average left ventricular global longitudinal strain is -20.1 %. The global longitudinal strain is normal.  2. Pacing wires in RA/RV . Right ventricular systolic function is normal. The right ventricular size is normal. There is moderately elevated pulmonary artery systolic pressure.  3. Left atrial size was moderately dilated.  4. Right atrial size was moderately dilated.  5. The mitral valve is degenerative. Mild to moderate mitral valve regurgitation. No evidence of mitral stenosis.  6. Tricuspid valve regurgitation is mild to moderate.  7. The aortic valve is tricuspid. There is moderate calcification of the aortic valve. There is moderate thickening of the aortic valve. Aortic valve regurgitation is mild. Aortic valve sclerosis/calcification is present, without any evidence of  aortic stenosis.  8. Aortic dilatation noted. There is mild dilatation of the ascending aorta, measuring 38 mm.  9. The inferior vena cava is normal in size with greater than 50% respiratory variability, suggesting right atrial pressure of 3 mmHg. Comparison(s): 12/31/17 EF 55-60%. FINDINGS  Left Ventricle: Left ventricular ejection fraction, by estimation, is 55 to 60%. The left ventricle has normal function. The left ventricle has no regional wall motion abnormalities. The average left ventricular global longitudinal strain is -20.1 %. The global longitudinal strain is normal. The left ventricular internal cavity size was normal in size. There is mild asymmetric left ventricular hypertrophy of the basal and septal segments. Left ventricular diastolic parameters are consistent with Grade I diastolic dysfunction (impaired relaxation). Right Ventricle: Pacing wires in RA/RV. The right ventricular size is normal. No increase in right ventricular wall thickness. Right ventricular systolic function is normal. There is moderately elevated pulmonary artery systolic pressure. The tricuspid regurgitant velocity is 3.35 m/s, and with an assumed right atrial pressure of 3 mmHg, the estimated right ventricular systolic pressure is 51.7 mmHg. Left Atrium: Left atrial size was moderately dilated. Right Atrium: Right atrial size was moderately dilated. Pericardium: There is no evidence of pericardial effusion. Mitral Valve: The mitral valve is degenerative in appearance. There is mild thickening of the mitral valve leaflet(s). There is mild calcification of the mitral valve leaflet(s). Mild to moderate mitral valve regurgitation. No evidence of mitral valve stenosis. Tricuspid Valve: The tricuspid valve is normal in structure. Tricuspid valve regurgitation is mild to moderate. No evidence of tricuspid stenosis. Aortic Valve: The aortic valve is tricuspid. There is moderate calcification of the aortic valve. There is moderate  thickening of the aortic valve. Aortic valve regurgitation is mild. Aortic regurgitation PHT measures 513 msec. Aortic valve sclerosis/calcification is present, without any evidence of aortic stenosis. Pulmonic Valve: The pulmonic valve was normal in structure. Pulmonic valve regurgitation is mild. No evidence of pulmonic stenosis. Aorta: Aortic dilatation noted. There is mild dilatation of the ascending aorta, measuring 38 mm. Venous: The inferior vena cava is normal in size with greater than 50% respiratory variability, suggesting right atrial pressure of 3 mmHg. IAS/Shunts: No atrial level shunt detected by color flow Doppler. Additional Comments: A device lead is visualized.  LEFT VENTRICLE PLAX 2D LVIDd:         4.90 cm LVIDs:         3.10 cm   2D Longitudinal Strain LV PW:         1.00 cm   2D Strain GLS (A2C):   -23.6 % LV IVS:        1.00 cm   2D Strain GLS (A3C):   -17.5 %  LVOT diam:     2.10 cm   2D Strain GLS (A4C):   -19.1 % LV SV:         74        2D Strain GLS Avg:     -20.1 % LV SV Index:   35 LVOT Area:     3.46 cm                           3D Volume EF:                          3D EF:        55 %                          LV EDV:       122 ml                          LV ESV:       55 ml                          LV SV:        68 ml RIGHT VENTRICLE             IVC RV Basal diam:  4.40 cm     IVC diam: 1.80 cm RV Mid diam:    4.10 cm RV S prime:     15.20 cm/s TAPSE (M-mode): 2.4 cm RVSP:           47.9 mmHg LEFT ATRIUM             Index        RIGHT ATRIUM           Index LA diam:        5.10 cm 2.45 cm/m   RA Pressure: 3.00 mmHg LA Vol (A2C):   95.0 ml 45.62 ml/m  RA Area:     27.90 cm LA Vol (A4C):   92.1 ml 44.23 ml/m  RA Volume:   99.90 ml  47.97 ml/m LA Biplane Vol: 94.2 ml 45.24 ml/m  AORTIC VALVE LVOT Vmax:   91.10 cm/s LVOT Vmean:  62.700 cm/s LVOT VTI:    0.213 m AI PHT:      513 msec  AORTA Ao Root diam: 3.00 cm Ao Asc diam:  3.80 cm TRICUSPID VALVE TR Peak grad:   44.9 mmHg TR Vmax:         335.00 cm/s Estimated RAP:  3.00 mmHg RVSP:           47.9 mmHg  SHUNTS Systemic VTI:  0.21 m Systemic Diam: 2.10 cm Jenkins Rouge MD Electronically signed by Jenkins Rouge MD Signature Date/Time: 08/10/2021/3:38:40 PM    Final    DG HIP OPERATIVE UNILAT W OR W/O PELVIS RIGHT  Result Date: 08/14/2021 CLINICAL DATA:  Right hip arthroplasty EXAM: OPERATIVE right HIP (WITH PELVIS IF PERFORMED) AP VIEWS TECHNIQUE: Fluoroscopic spot image(s) were submitted for interpretation post-operatively. COMPARISON:  None. FINDINGS: Fluoroscopic images show degenerative changes in the right hip in the early images and evidence of right hip arthroplasty in the later images. No definite fractures are seen. Estimated radiation dose is 3.86 mGy. IMPRESSION: Fluoroscopic assistance was provided for right hip arthroplasty. Electronically Signed   By: Prudy Feeler.D.  On: 08/14/2021 11:02      No flowsheet data found.  No results found for: NITRICOXIDE   08/22/2021: Walking oximetry today - briefly desatted to 88% for <15 seconds and recovered without oxygen. Maintained high 90's for the remainder of the walk. Was SOB. Completed 300 ft.   Assessment & Plan:   Shortness of breath Worsening SOB with minimal exertion s/p L total hip. Was noted to be anemic post op requiring blood transfusion; CBC today. RLE edema, likely related to recent surgery; however, will check BNP and BMET for further eval. Continue TED hose. CXR today neg for acute process. Walking oximetry - desatted to 88% for <15 sec per CMA and recovered to high 90's without O2; suspect this could have been false reading. If unable to correlate SOB to existing or new etiology, may need to perform CTA since she was off of her Eliquis   Patient Instructions  Continue to use CPAP 8 cmH2O every night, minimum of 6 hours a night. Order placed for new machine Change equipment every 30 days or as directed by DME. Wash your tubing with warm soap and water  daily, hang to dry. Wash humidifier portion weekly.  Maintain clean equipment, as directed by home health agency.  Be aware of reduced alertness and do not drive or operate heavy machinery if experiencing this or drowsiness.  Exercise encouraged, as tolerated. Healthy weight management discussed.  Avoid or decrease alcohol consumption and medications that make you more sleepy, if possible. Notify if persistent daytime sleepiness occurs even with consistent use of CPAP.  Chest x ray today without any areas of concern  Labs today - CBC with diff, BNP, BMET  Walking oximetry today with any decrease in your oxygen.  Continue wearing TED hose. Encouraged increased activity, as tolerated.  Use Incentive Spirometer 10x/hour while awake if not up moving around  Follow up in 2 weeks with Dr. Halford Chessman or Katie Hamsini Verrilli,NP. If symptoms do not improve or worsen, please contact office for sooner follow up or seek emergency care.    OSA on CPAP Compliant with CPAP therapy and tolerating well. Having some issues with machine, which is >26 years old. Continue with CPAP at 8 cmH2O. Order for new CPAP sent to current DME.  (HFpEF) heart failure with preserved ejection fraction (HCC) Worsening MVR and new evidence of moderate pulm HTN on most recent echo. Likely contributing factors to SOB. No evidence of fluid overload on exam. BNP and BMET today.   44 min spent on case, >50% face to face  Clayton Bibles, NP 08/29/2021  Pt aware and understands NP's role.

## 2021-08-23 ENCOUNTER — Encounter: Payer: Self-pay | Admitting: Internal Medicine

## 2021-08-23 ENCOUNTER — Ambulatory Visit (INDEPENDENT_AMBULATORY_CARE_PROVIDER_SITE_OTHER): Payer: Medicare Other | Admitting: Internal Medicine

## 2021-08-23 ENCOUNTER — Telehealth: Payer: Self-pay | Admitting: Internal Medicine

## 2021-08-23 VITALS — BP 144/70 | HR 84 | Ht 68.0 in

## 2021-08-23 DIAGNOSIS — I5030 Unspecified diastolic (congestive) heart failure: Secondary | ICD-10-CM | POA: Diagnosis not present

## 2021-08-23 DIAGNOSIS — I4819 Other persistent atrial fibrillation: Secondary | ICD-10-CM

## 2021-08-23 DIAGNOSIS — I482 Chronic atrial fibrillation, unspecified: Secondary | ICD-10-CM | POA: Diagnosis not present

## 2021-08-23 DIAGNOSIS — R2689 Other abnormalities of gait and mobility: Secondary | ICD-10-CM | POA: Diagnosis not present

## 2021-08-23 DIAGNOSIS — M1990 Unspecified osteoarthritis, unspecified site: Secondary | ICD-10-CM | POA: Diagnosis not present

## 2021-08-23 DIAGNOSIS — M6281 Muscle weakness (generalized): Secondary | ICD-10-CM | POA: Diagnosis not present

## 2021-08-23 DIAGNOSIS — Z471 Aftercare following joint replacement surgery: Secondary | ICD-10-CM | POA: Diagnosis not present

## 2021-08-23 MED ORDER — METOPROLOL SUCCINATE ER 50 MG PO TB24: 50.0000 mg | ORAL_TABLET | Freq: Three times a day (TID) | ORAL | 3 refills | Status: DC

## 2021-08-23 MED ORDER — METOPROLOL SUCCINATE ER 50 MG PO TB24: 100.0000 mg | ORAL_TABLET | Freq: Three times a day (TID) | ORAL | 3 refills | Status: DC

## 2021-08-23 NOTE — Patient Instructions (Addendum)
Medication Instructions:  Your physician has recommended you make the following change in your medication:   Increase Metoprolol Succinate 50mg  - 2 tablets by mouth three times daily.  Stop Lisinopril  *If you need a refill on your cardiac medications before your next appointment, please call your pharmacy*   Lab Work: None ordered.  If you have labs (blood work) drawn today and your tests are completely normal, you will receive your results only by: [place] (if you have MyChart) OR A paper copy in the mail If you have any lab test that is abnormal or we need to change your treatment, we will call you to review the results.   Testing/Procedures: None ordered.    Follow-Up: At [facility], you and your health needs are our priority.  As part of our continuing mission to provide you with exceptional heart care, we have created designated Provider Care Teams.  These Care Teams include your primary Cardiologist (physician) and Advanced Practice Providers (APPs -  Physician Assistants and Nurse Practitioners) who all work together to provide you with the care you need, when you need it.  We recommend signing up for the patient portal called "MyChart".  Sign up information is provided on this After Visit Summary.  MyChart is used to connect with patients for Virtual Visits (Telemedicine).  Patients are able to view lab/test results, encounter notes, upcoming appointments, etc.  Non-urgent messages can be sent to your provider as well.   To learn more about what you can do with MyChart, go to [email or web address].    Your next appointment:   Follow up with Dr Olin Pia PA-C in 7-10 days.  I will have the scheduler call you to schedule this appointment.

## 2021-08-23 NOTE — Progress Notes (Signed)
Patient ID: Brittany [name], female   DOB: 1934/01/27, 88 y.o.   MRN: [id number]      Patient Care Team: Donald Prose, MD as PCP - General (Family Medicine) Deboraha Sprang, MD as PCP - Electrophysiology (Cardiology) Deboraha Sprang, MD as PCP - Cardiology (Cardiology) Bjorn Loser, MD as Consulting Physician (Urology) Deboraha Sprang, MD as Consulting Physician (Cardiology) Chesley Mires, MD as Consulting Physician (Pulmonary Disease) Melrose Nakayama, MD as Consulting Physician (Orthopedic Surgery) Antionette Fairy Isaias Cowman, MD as Referring Physician (Ophthalmology) Paulla Dolly Tamala Fothergill, DPM as Consulting Physician (Podiatry)   HPI  Brittany [name] is a 88 y.o. female Seen in followup for  pacer Medtronic implanted 12/12 for sinus node dysfunction.  She has atrial fibrillation for which she previously took sotalol but more recently has been transitioned to dofetilide.  Anticoagulation with apixaban  She will be undergoing a R knee arthroplasty 08/14/21. She had her L knee replaced 02/13/21. She endorses a history of stroke occurring 2 to 3 years ago and was put on Eliquis. During her previous surgery, she stopped the Eliquis 3 days precluding spinal  Of note, she also hope to replace her R hip because of pain and bone degeneration.   Has had variable dyspnea and tachypalpitations   Event recorder >> aflut 26% burden HR 56--141 avg  76  Seen by pulm the other day with concerns re fluid overload and started on furosemide    The patient denies chest pain, nocturnal dyspnea, orthopnea or peripheral edema.  There have been no lightheadedness or syncope.  Complains of R knee/hip pain, palpitations, and dyspnea on exertion.   DATE TEST EF    8/12  Myoview  Normal LV No ischemia  6/19 Echo   55-60 %     1/23 Echo   55-60% BAE mod    Date Cr K Mg Hgb   5/17   1.9   1/18  0.59 4.5   14.7  11/18 0.55 4.4  15.8  3/20 0.61 4.4 2.1 12.9  9/20 0.63 4.9 2.0   8/21    13.0  10/21 0.69 4.7 1.9    7/22 0.64 3.9  14.1  8/22 0.49 4.5 2.0   1/23 0.66 4.1  8.8   Thromboembolic risk factors ( age  -2, HTN-1, Gender-1, Vascular disease +/- 1) for a CHADSVASc Score of >=4   Past Medical History:  Diagnosis Date   Ankle fracture 2016   Arthritis    Atrial fibrillation (HCC)    Depression    Dysrhythmia    Elevated parathyroid hormone    Fracture of orbital floor with routine healing    GERD (gastroesophageal reflux disease)    History of colon polyps    benign   Hypercalcemia    Hypertension    Hyponatremia    Hypothyroid    Macular degeneration    wet in the right and dry in the left   OSA on CPAP    Osteoporosis    Peripheral vascular disease (HCC)    Presence of permanent cardiac pacemaker    PSVT (paroxysmal supraventricular tachycardia) (HCC)    Rotator cuff tear    Sinus node dysfunction ([place])    a. s/p MDT pacemaker   Stroke [facility])    Urinary incontinence    Wrist fracture 2018    Past Surgical History:  Procedure Laterality Date   BACK SURGERY     CATARACT EXTRACTION     CATARACT EXTRACTION W/ INTRAOCULAR LENS  IMPLANT,  BILATERAL Bilateral    COLONOSCOPY     ESOPHAGOGASTRODUODENOSCOPY     IR ANGIO INTRA EXTRACRAN SEL COM CAROTID INNOMINATE BILAT MOD SED  12/30/2017   IR ANGIO VERTEBRAL SEL VERTEBRAL BILAT MOD SED  12/30/2017   LAPAROSCOPIC CHOLECYSTECTOMY  08/11/1999   LUMBAR [place] SURGERY  02/19/2014   ORIF ANKLE FRACTURE Left 04/13/2015   Procedure: OPEN REDUCTION INTERNAL FIXATION (ORIF) LEFT ANKLE FRACTURE;  Surgeon: Meredith Pel, MD;  Location: [place];  Service: Orthopedics;  Laterality: Left;   PACEMAKER PLACEMENT Right 07/22/2010   a. MDT dual chamber PPM implanted by Dr Caryl Comes    SHOULDER ARTHROSCOPY W/ ROTATOR CUFF REPAIR Right 08/30/2014   WITH MINI-OPEN ROTATOR CUFF REPAIR AND SUBACROMIAL DECOMPRESSION   SHOULDER ARTHROSCOPY WITH ROTATOR CUFF REPAIR AND SUBACROMIAL DECOMPRESSION Right 08/30/2014   Procedure: SHOULDER ARTHROSCOPY WITH  MINI-OPEN ROTATOR CUFF REPAIR AND SUBACROMIAL DECOMPRESSION;  Surgeon: Meredith Pel, MD;  Location: [place];  Service: Orthopedics;  Laterality: Right;  RIGHT SHOULDER DIAGNOSTIC OPERATIVE ARTHROSCOPY, SUBACROMIAL DECOMPRESSION, MINI-OPEN ROTATOR CUFF REPAIR.   TOTAL HIP ARTHROPLASTY Right 08/14/2021   Procedure: TOTAL HIP ARTHROPLASTY ANTERIOR APPROACH;  Surgeon: Melrose Nakayama, MD;  Location: [facility];  Service: Orthopedics;  Laterality: Right;   TOTAL KNEE ARTHROPLASTY Left 02/13/2021   Procedure: LEFT TOTAL KNEE ARTHROPLASTY;  Surgeon: Melrose Nakayama, MD;  Location: [facility];  Service: Orthopedics;  Laterality: Left;   VAGINAL HYSTERECTOMY  07/22/1968   WRIST FRACTURE SURGERY Left     Current Outpatient Medications  Medication Sig Dispense Refill   acetaminophen (TYLENOL) 500 MG tablet Take 1,000 mg by mouth every 6 (six) hours as needed for moderate pain.     alendronate (FOSAMAX) 70 MG tablet Take 1 tablet (70 mg total) by mouth every 7 (seven) days. Take with a full glass of water on an empty stomach. 4 tablet 11   apixaban (ELIQUIS) 5 MG TABS tablet Take 1 tablet (5 mg total) by mouth 2 (two) times daily. 180 tablet 3   atorvastatin (LIPITOR) 40 MG tablet Take 1 tablet (40 mg total) by mouth daily at 6 PM. 30 tablet 3   B Complex-C (B-COMPLEX WITH VITAMIN C) tablet Take 1 tablet by mouth daily with lunch.      Cholecalciferol (VITAMIN D3) 5000 UNITS CAPS Take 5,000 Units by mouth daily with breakfast.      desmopressin (DDAVP) 0.2 MG tablet Take 0.2 mg by mouth daily as needed (to control bladder on car trip).      diltiazem (CARDIZEM CD) 120 MG 24 hr capsule take 1 capsule by mouth every morning and at bedtime 180 capsule 0   dofetilide (TIKOSYN) 500 MCG capsule TAKE ONE CAPSULE BY MOUTH TWICE DAILY 180 capsule 3   furosemide (LASIX) 40 MG tablet Take 1 tablet (40 mg total) by mouth daily for 7 days. 7 tablet 0   HYDROcodone-acetaminophen (NORCO/VICODIN) 5-325 MG tablet Take 1 tablet by  mouth every 6 (six) hours as needed for moderate pain or severe pain (post op pain). 30 tablet 0   lisinopril (ZESTRIL) 40 MG tablet Take 40 mg by mouth daily. Patient taking 20mg      Lutein 20 MG TABS Take 20 mg by mouth daily with breakfast.     magnesium oxide (MAG-OX) 400 MG tablet Take 1 tablet (400 mg total) by mouth daily. 30 tablet 0   Melatonin 10 MG TABS Take 10-40 mg by mouth at bedtime as needed (sleep).     metoprolol succinate (TOPROL-XL) 50 MG 24  hr tablet Take 2 tablets by mouth twice daily with or immediately following meals (Patient taking differently: Take 100 mg by mouth in the morning and at bedtime.) 360 tablet 3   Multiple Vitamin (MULTIVITAMIN WITH MINERALS) TABS tablet Take 1 tablet by mouth daily with breakfast.      Multiple Vitamins-Minerals (PRESERVISION AREDS 2 PO) Take 1 capsule by mouth 2 (two) times daily with a meal.      PRESCRIPTION MEDICATION Inhale into the lungs at bedtime. CPAP     tiZANidine (ZANAFLEX) 2 MG tablet Take 1-2 tablets (2-4 mg total) by mouth every 6 (six) hours as needed for muscle spasms. 40 tablet 1   sertraline (ZOLOFT) 50 MG tablet Take 1 tablet (50 mg total) by mouth daily. 30 tablet 2   No current facility-administered medications for this visit.    Allergies  Allergen Reactions   Adhesive [Tape] Swelling and Rash   Benzalkonium Chloride Rash    Pt was not aware of this allergy   Neosporin [Neomycin-Polymyxin-Gramicidin] Swelling and Rash    Review of Systems negative except from HPI and PMH  Physical Exam: BP (!) 144/70 (BP Location: Left Arm, Patient Position: Sitting, Cuff Size: Normal)    Pulse 84    Ht 5\' 8"  (1.727 m)    LMP 07/22/1968    BMI 31.63 kg/m  Well developed and well nourished in no acute distress HENT normal Neck supple with JVP-flat crackles Device pocket well healed; without hematoma or erythema.  There is no tethering  Regular rate and rhythm, no gallop No murmur Abd-soft with active BS No Clubbing  cyanosis no edema Skin-warm and dry A & Oriented  Grossly normal sensory and motor function  ECG AV pacing at 61 Intervals 28/11/45  Assessment and  Plan Atrial fibrillation-persistent     Atrial tachycardia   Sinus bradycardia   HFpEF   Pacemaker-Medtronic     High Risk Medication Surveillance -Dofetilide  Hypertension  Hyperlipidemia  Left leg edema  Preop  Persistent dyspnea which she dates to her knee surgery and now further aggravated following her hip surgery without significant evidence on physical examination of volume overload.  She was seen by pulmonary yesterday who ordered a BNP which was elevated.  Chest x-ray was unrevealing.  She had some edema and she was started on furosemide and this has been associate with an unusual urinary response which may be related to the concurrent this continuation of her DDAVP.  This had been started years ago and she uses it variably.  Lengthy discussion regarding the physiology of HFpEF, but I am not altogether convinced that HFpEF is the entire explanation for her dyspnea.  We will continue her on furosemide for about a week and then reassess.  Moreover, she is having a persistent heart rate in the 80s which is behaving almost like an atrial tachycardia and that his heart rate excursion is relatively limited.  We will increase her metoprolol from 100 twice daily--150 twice daily to see if we can slow her heart rate down given the HFpEF physiology.  We will stop her lisinopril  Continue the Tikosyn

## 2021-08-23 NOTE — Telephone Encounter (Signed)
Patient called in requesting a sooner appt due to seeing her pulmonologist yesterday and being advised to schedule. Patient states her hemoglobin is low and she was told she was anemic. Was put on Lasix by pulmonologist yesterday, and states this was cleared with Dr. Caryl Comes first. Appt was rescheduled for today 02/02 at 3:00 PM.

## 2021-08-23 NOTE — Progress Notes (Signed)
Please contact to reschedule f/u for one week (later next week)! Thanks!

## 2021-08-24 DIAGNOSIS — M6281 Muscle weakness (generalized): Secondary | ICD-10-CM | POA: Diagnosis not present

## 2021-08-24 DIAGNOSIS — M1611 Unilateral primary osteoarthritis, right hip: Secondary | ICD-10-CM | POA: Diagnosis not present

## 2021-08-24 DIAGNOSIS — R2689 Other abnormalities of gait and mobility: Secondary | ICD-10-CM | POA: Diagnosis not present

## 2021-08-24 DIAGNOSIS — Z471 Aftercare following joint replacement surgery: Secondary | ICD-10-CM | POA: Diagnosis not present

## 2021-08-24 DIAGNOSIS — I482 Chronic atrial fibrillation, unspecified: Secondary | ICD-10-CM | POA: Diagnosis not present

## 2021-08-24 DIAGNOSIS — M1990 Unspecified osteoarthritis, unspecified site: Secondary | ICD-10-CM | POA: Diagnosis not present

## 2021-08-24 DIAGNOSIS — Z9889 Other specified postprocedural states: Secondary | ICD-10-CM | POA: Diagnosis not present

## 2021-08-27 DIAGNOSIS — M6281 Muscle weakness (generalized): Secondary | ICD-10-CM | POA: Diagnosis not present

## 2021-08-27 DIAGNOSIS — M1990 Unspecified osteoarthritis, unspecified site: Secondary | ICD-10-CM | POA: Diagnosis not present

## 2021-08-27 DIAGNOSIS — Z471 Aftercare following joint replacement surgery: Secondary | ICD-10-CM | POA: Diagnosis not present

## 2021-08-27 DIAGNOSIS — I482 Chronic atrial fibrillation, unspecified: Secondary | ICD-10-CM | POA: Diagnosis not present

## 2021-08-27 DIAGNOSIS — R2689 Other abnormalities of gait and mobility: Secondary | ICD-10-CM | POA: Diagnosis not present

## 2021-08-28 DIAGNOSIS — Z471 Aftercare following joint replacement surgery: Secondary | ICD-10-CM | POA: Diagnosis not present

## 2021-08-28 DIAGNOSIS — M6281 Muscle weakness (generalized): Secondary | ICD-10-CM | POA: Diagnosis not present

## 2021-08-28 DIAGNOSIS — M1990 Unspecified osteoarthritis, unspecified site: Secondary | ICD-10-CM | POA: Diagnosis not present

## 2021-08-28 DIAGNOSIS — R2689 Other abnormalities of gait and mobility: Secondary | ICD-10-CM | POA: Diagnosis not present

## 2021-08-28 DIAGNOSIS — I482 Chronic atrial fibrillation, unspecified: Secondary | ICD-10-CM | POA: Diagnosis not present

## 2021-08-28 NOTE — Addendum Note (Signed)
Addended by: Thora Lance on: 08/28/2021 01:51 PM   Modules accepted: Orders

## 2021-08-29 ENCOUNTER — Ambulatory Visit (INDEPENDENT_AMBULATORY_CARE_PROVIDER_SITE_OTHER): Payer: Medicare Other

## 2021-08-29 ENCOUNTER — Ambulatory Visit (INDEPENDENT_AMBULATORY_CARE_PROVIDER_SITE_OTHER): Payer: Medicare Other | Admitting: Nurse Practitioner

## 2021-08-29 ENCOUNTER — Other Ambulatory Visit: Payer: Self-pay

## 2021-08-29 ENCOUNTER — Encounter: Payer: Self-pay | Admitting: Nurse Practitioner

## 2021-08-29 VITALS — BP 128/74 | HR 91 | Temp 97.7°F | Ht 68.0 in | Wt 213.8 lb

## 2021-08-29 DIAGNOSIS — G4733 Obstructive sleep apnea (adult) (pediatric): Secondary | ICD-10-CM

## 2021-08-29 DIAGNOSIS — R0602 Shortness of breath: Secondary | ICD-10-CM | POA: Diagnosis not present

## 2021-08-29 DIAGNOSIS — Z9989 Dependence on other enabling machines and devices: Secondary | ICD-10-CM | POA: Diagnosis not present

## 2021-08-29 DIAGNOSIS — I495 Sick sinus syndrome: Secondary | ICD-10-CM

## 2021-08-29 DIAGNOSIS — I5030 Unspecified diastolic (congestive) heart failure: Secondary | ICD-10-CM

## 2021-08-29 DIAGNOSIS — D649 Anemia, unspecified: Secondary | ICD-10-CM

## 2021-08-29 LAB — CUP PACEART REMOTE DEVICE CHECK
Battery Impedance: 4020 Ohm
Battery Remaining Longevity: 13 mo
Battery Voltage: 2.71 V
Brady Statistic AP VP Percent: 1 %
Brady Statistic AP VS Percent: 4 %
Brady Statistic AS VP Percent: 1 %
Brady Statistic AS VS Percent: 94 %
Date Time Interrogation Session: 20230208085551
Implantable Lead Implant Date: 20121213
Implantable Lead Implant Date: 20121213
Implantable Lead Location: 753859
Implantable Lead Location: 753860
Implantable Lead Model: 4076
Implantable Lead Model: 4076
Implantable Lead Serial Number: 835025
Implantable Lead Serial Number: 869269
Implantable Pulse Generator Implant Date: 20121213
Lead Channel Impedance Value: 344 Ohm
Lead Channel Impedance Value: 447 Ohm
Lead Channel Pacing Threshold Amplitude: 0.5 V
Lead Channel Pacing Threshold Amplitude: 1 V
Lead Channel Pacing Threshold Pulse Width: 0.4 ms
Lead Channel Pacing Threshold Pulse Width: 0.4 ms
Lead Channel Setting Pacing Amplitude: 2 V
Lead Channel Setting Pacing Amplitude: 2.5 V
Lead Channel Setting Pacing Pulse Width: 0.4 ms
Lead Channel Setting Sensing Sensitivity: 2 mV

## 2021-08-29 MED ORDER — ALBUTEROL SULFATE HFA 108 (90 BASE) MCG/ACT IN AERS: 2.0000 | INHALATION_SPRAY | Freq: Four times a day (QID) | RESPIRATORY_TRACT | 2 refills | Status: DC | PRN

## 2021-08-29 NOTE — Addendum Note (Signed)
Addended by: Clayton Bibles on: 08/29/2021 11:55 AM   Modules accepted: Orders

## 2021-08-29 NOTE — Progress Notes (Signed)
Reviewed and agree with assessment/plan.   Chesley Mires, MD [facility] Pulmonary/Critical Care 08/29/2021, 3:31 PM Pager:  [phone]

## 2021-08-29 NOTE — Assessment & Plan Note (Addendum)
LE edema and worsening DOE at previous visit. BNP elevated to 921; BMET with hyponatremia. Suspected symptoms r/t fluid overload. Improved with lasix 40 mg for a week. F/u with cardiology. Will reassess BMET today.

## 2021-08-29 NOTE — Progress Notes (Signed)
@Patient  ID: Brittany [name], female    DOB: [birth year], 88 y.o.   MRN: [id number]  Chief Complaint  Patient presents with   Follow-up    She reports that she feels better with her breathing,     Referring provider: Donald Prose, MD  HPI: 88 year old female, former smoker (45 pack years) follow-up for obstructive sleep apnea.  She is a patient Dr. Halford Chessman and was last seen in office on 08/22/2021 by [facility] NP.  Past medical history significant for hypertension, A-fib on chronic anticoagulation, sinus node dysfunction status post pacemaker, HFpEF, hyperparathyroidism, osteoporosis, anxiety and depression, HLD.  TEST/EVENTS:  08/13/2012 PSG: AHI 24, SaO2 low of 72%, CPAP 8 07/03/2014 PSG: AHI 24.4, SaO2 low of 76%, PLMI 81.8, CPAP 8--> AHI 0,+S 12/31/2017 echocardiogram: EF 55 to 60%, mild AR 08/10/2021 echocardiogram: EF 55 to 60%.  G1 DD.  Moderately elevated PASP.  LA and RA moderately dilated.  Mild to moderate MVR.  TVR mild to moderate.  Mild dilation of the ascending aorta.  05/25/2021: OV with Dr. Halford Chessman.  Follow-up for OSA.  Compliant with CPAP therapy; continue CPAP at 8 cmH2O  08/22/2021: OV with Banesa Tristan NP for shortness of breath.  Began after she had LTKA in July and worsened after right total hip last month.  Slight increase in swelling in right lower extremity.  Continued on CPAP nightly.  Chronically anticoagulated on Eliquis; however was off of it for 4 to 5 days when she had surgery.  Walking oximetry with very brief desaturation to 88%, recovered without oxygen to 90s.  Found to be anemic on labs; however, also had hyponatremia and BNP was elevated to 921.  Suspected heart failure and fluid overload contributing to dyspnea.  Started on Lasix and advised to follow-up with cardiology.  08/29/2021: Today - follow up Patient presents today with husband for follow up. Her shortness of breath has improved; although, she feels as though she is still not back to her baseline. Her lower extremity  swelling has also resolved. She was seen by cardiology last week who did not think she needed to be on daily lasix. She completed her lasix yesterday. She denies cough, wheezing, orthopnea, PND, or chest pain. She has not tried any inhaler therapies. She did stop her DDAVP that she was previously taking daily, despite it being intended for PRN use. Continues on CPAP nightly.  Allergies  Allergen Reactions   Citalopram Hydrobromide Other (See Comments)    Other reaction(s): QT prolongation with Tikosyn   Adhesive [Tape] Swelling and Rash   Bacitracin-Polymyxin B Other (See Comments)    Other reaction(s): Unknown   Benzalkonium Chloride Rash    Pt was not aware of this allergy   Neosporin [Neomycin-Polymyxin-Gramicidin] Swelling and Rash    Immunization History  Administered Date(s) Administered   Fluad Quad(high Dose 65+) 04/25/2020   Influenza Split 07/01/2013, 04/21/2014, 04/27/2017, 04/12/2021   Influenza, High Dose Seasonal PF 04/12/2016, 04/22/2018, 04/22/2018, 04/22/2019, 04/25/2020, 05/03/2020, 05/03/2020   Influenza, Seasonal, Injecte, Preservative Fre 04/22/2013   Influenza,inj,Quad PF,6-35 Mos 05/05/2017   Influenza-Unspecified 04/22/2013, 07/01/2013, 04/21/2014, 05/06/2015, 05/05/2017, 04/21/2018   Moderna Covid-19 Vaccine Bivalent Booster 64yrs & up 04/12/2021   Moderna SARS-COV2 Booster Vaccination 05/24/2020   Moderna Sars-Covid-2 Vaccination 08/03/2019, 08/31/2019, 05/24/2020, 11/08/2020   PPD Test 11/30/2012, 01/01/2013   Pneumococcal Conjugate-13 05/18/2014   Pneumococcal Polysaccharide-23 07/23/2011, 10/28/2012   Td 10/28/2012   Tdap 07/22/2012, 10/28/2012    Past Medical History:  Diagnosis Date   Ankle fracture 2016  Arthritis    Atrial fibrillation (HCC)    Depression    Dysrhythmia    Elevated parathyroid hormone    Fracture of orbital floor with routine healing    GERD (gastroesophageal reflux disease)    History of colon polyps    benign    Hypercalcemia    Hypertension    Hyponatremia    Hypothyroid    Macular degeneration    wet in the right and dry in the left   OSA on CPAP    Osteoporosis    Peripheral vascular disease (HCC)    Presence of permanent cardiac pacemaker    PSVT (paroxysmal supraventricular tachycardia) (HCC)    Rotator cuff tear    Sinus node dysfunction (HCC)    a. s/p MDT pacemaker   Stroke [facility])    Urinary incontinence    Wrist fracture 2018    Tobacco History: Social History   Tobacco Use  Smoking Status Former   Packs/day: 1.00   Years: 45.00   Pack years: 45.00   Types: Cigarettes   Quit date: 1992   Years since quitting: 31.1  Smokeless Tobacco Never  Tobacco Comments   quit smoking in 1992   Counseling given: Not Answered Tobacco comments: quit smoking in 1992   Outpatient Medications Prior to Visit  Medication Sig Dispense Refill   acetaminophen (TYLENOL) 500 MG tablet Take 1,000 mg by mouth every 6 (six) hours as needed for moderate pain.     alendronate (FOSAMAX) 70 MG tablet Take 1 tablet (70 mg total) by mouth every 7 (seven) days. Take with a full glass of water on an empty stomach. 4 tablet 11   apixaban (ELIQUIS) 5 MG TABS tablet Take 1 tablet (5 mg total) by mouth 2 (two) times daily. 180 tablet 3   B Complex-C (B-COMPLEX WITH VITAMIN C) tablet Take 1 tablet by mouth daily with lunch.      Cholecalciferol (VITAMIN D3) 5000 UNITS CAPS Take 5,000 Units by mouth daily with breakfast.      diltiazem (CARDIZEM CD) 120 MG 24 hr capsule take 1 capsule by mouth every morning and at bedtime 180 capsule 0   dofetilide (TIKOSYN) 500 MCG capsule TAKE ONE CAPSULE BY MOUTH TWICE DAILY 180 capsule 3   Lutein 20 MG TABS Take 20 mg by mouth daily with breakfast.     magnesium oxide (MAG-OX) 400 MG tablet Take 1 tablet (400 mg total) by mouth daily. 30 tablet 0   Melatonin 10 MG TABS Take 10-40 mg by mouth at bedtime as needed (sleep).     metoprolol succinate (TOPROL-XL) 50 MG 24 hr  tablet Take 2 tablets (100 mg total) by mouth 3 (three) times daily. Take with or immediately following a meal. 540 tablet 3   Multiple Vitamin (MULTIVITAMIN WITH MINERALS) TABS tablet Take 1 tablet by mouth daily with breakfast.      Multiple Vitamins-Minerals (PRESERVISION AREDS 2 PO) Take 1 capsule by mouth 2 (two) times daily with a meal.      PRESCRIPTION MEDICATION Inhale into the lungs at bedtime. CPAP     sertraline (ZOLOFT) 50 MG tablet Take 1 tablet (50 mg total) by mouth daily. 30 tablet 2   atorvastatin (LIPITOR) 40 MG tablet Take 1 tablet (40 mg total) by mouth daily at 6 PM. (Patient not taking: Reported on 08/29/2021) 30 tablet 3   desmopressin (DDAVP) 0.2 MG tablet Take 0.2 mg by mouth daily as needed (to control bladder on car trip).  (Patient  not taking: Reported on 08/29/2021)     furosemide (LASIX) 40 MG tablet Take 1 tablet (40 mg total) by mouth daily for 7 days. (Patient not taking: Reported on 08/29/2021) 7 tablet 0   HYDROcodone-acetaminophen (NORCO/VICODIN) 5-325 MG tablet Take 1 tablet by mouth every 6 (six) hours as needed for moderate pain or severe pain (post op pain). (Patient not taking: Reported on 08/29/2021) 30 tablet 0   tiZANidine (ZANAFLEX) 2 MG tablet Take 1-2 tablets (2-4 mg total) by mouth every 6 (six) hours as needed for muscle spasms. (Patient not taking: Reported on 08/29/2021) 40 tablet 1   No facility-administered medications prior to visit.     Review of Systems:   Constitutional: No weight loss or gain, night sweats, fevers, chills, fatigue, or lassitude. HEENT: No headaches, difficulty swallowing, tooth/dental problems, or sore throat. No sneezing, itching, ear ache, nasal congestion, or post nasal drip CV:  No chest pain, orthopnea, PND, swelling in lower extremities, anasarca, dizziness, palpitations, syncope Resp: +shortness of breath with exertion (improved but persists). No excess mucus or change in color of mucus. No productive or non-productive. No  hemoptysis. No wheezing.  No chest wall deformity GI:  No heartburn, indigestion, abdominal pain, nausea, vomiting, diarrhea, change in bowel habits, loss of appetite, bloody stools.  GU: No dysuria, change in color of urine, urgency or frequency.  No flank pain, no hematuria  Skin: No rash, lesions, ulcerations MSK:  No joint pain or swelling.  No decreased range of motion.  No back pain. Neuro: No dizziness or lightheadedness.  Psych: No depression or anxiety. Mood stable.     Physical Exam:  BP 128/74 (BP Location: Left Arm, Patient Position: Sitting, Cuff Size: Normal)    Pulse 91    Temp 97.7 F (36.5 C) (Oral)    Ht 5\' 8"  (1.727 m)    Wt 213 lb 12.8 oz (97 kg)    LMP 07/22/1968    SpO2 95%    BMI 32.51 kg/m   GEN: Pleasant, interactive, well-appearing; obese; in no acute distress. HEENT:  Normocephalic and atraumatic. EACs patent bilaterally. TM pearly gray with present light reflex bilaterally. PERRLA. Sclera white. Nasal turbinates pink, moist and patent bilaterally. No rhinorrhea present. Oropharynx pink and moist, without exudate or edema. No lesions, ulcerations, or postnasal drip.  NECK:  Supple w/ fair ROM. No JVD present. Normal carotid impulses w/o bruits. Thyroid symmetrical with no goiter or nodules palpated. No lymphadenopathy.   CV: RRR, no m/r/g, no peripheral edema. Pulses intact, +2 bilaterally. No cyanosis, pallor or clubbing. PULMONARY:  Unlabored, regular breathing. Clear bilaterally A&P w/o wheezes/rales/rhonchi. No accessory muscle use. No dullness to percussion. GI: BS present and normoactive. Soft, non-tender to palpation. No organomegaly or masses detected. No CVA tenderness. MSK: No erythema, warmth or tenderness. Cap refil <2 sec all extrem. No deformities or joint swelling noted.  Neuro: A/Ox3. No focal deficits noted.   Skin: Warm, no lesions or rashe Psych: Normal affect and behavior. Judgement and thought content appropriate.     Lab Results:  CBC     Component Value Date/Time   WBC 10.0 08/22/2021 1232   RBC 2.79 (L) 08/22/2021 1232   HGB 8.8 Repeated and verified X2. (L) 08/22/2021 1232   HGB 13.4 07/30/2021 1656   HGB 14.5 10/28/2012 0000   HCT 26.4 (L) 08/22/2021 1232   HCT 39.8 07/30/2021 1656   PLT 410.0 (H) 08/22/2021 1232   PLT 329 07/30/2021 1656   PLT 273 07/24/2012 0258  MCV 94.4 08/22/2021 1232   MCV 92 07/30/2021 1656   MCH 30.8 08/15/2021 0439   MCHC 33.3 08/22/2021 1232   RDW 14.4 08/22/2021 1232   RDW 13.6 07/30/2021 1656   RDW 13.5 10/28/2012 1300   LYMPHSABS 0.8 08/22/2021 1232   LYMPHSABS 22 10/28/2012 1300   MONOABS 1.3 (H) 08/22/2021 1232   EOSABS 0.4 08/22/2021 1232   EOSABS 0 10/28/2012 1300   BASOSABS 0.0 08/22/2021 1232   BASOSABS 1 10/28/2012 1300    BMET    Component Value Date/Time   NA 125 (L) 08/22/2021 1232   NA 137 07/30/2021 1656   K 4.1 08/22/2021 1232   K 3.9 10/28/2012 1246   CL 90 (L) 08/22/2021 1232   CL 100 10/28/2012 1246   CO2 26 08/22/2021 1232   GLUCOSE 109 (H) 08/22/2021 1232   BUN 12 08/22/2021 1232   BUN 16 07/30/2021 1656   BUN 9 10/28/2012 1246   CREATININE 0.66 08/22/2021 1232   CREATININE 0.59 (L) 08/14/2016 0930   CALCIUM 9.8 08/22/2021 1232   CALCIUM 10.4 10/28/2012 1246   GFRNONAA >60 08/15/2021 0439   GFRNONAA 87 08/14/2016 0930   GFRAA 92 05/02/2020 1444   GFRAA >89 08/14/2016 0930    BNP No results found for: BNP   Imaging:  DG Chest 2 View  Result Date: 08/22/2021 CLINICAL DATA:  SOB post op EXAM: CHEST - 2 VIEW COMPARISON:  02/08/2021. FINDINGS: Left subclavian dual lead cardiac rhythm maintenance device in similar position. Similar mild enlargement the cardiac silhouette. No consolidation. No visible pleural effusions or pneumothorax. Polyarticular gist age. IMPRESSION: No evidence of acute cardiopulmonary disease. Electronically Signed   By: Margaretha Sheffield M.D.   On: 08/22/2021 12:01   DG C-Arm 1-60 Min-No Report  Result Date:  08/14/2021 Fluoroscopy was utilized by the requesting physician.  No radiographic interpretation.   ECHOCARDIOGRAM COMPLETE  Result Date: 08/10/2021    ECHOCARDIOGRAM REPORT   Patient Name:   SHAKYRA MATTERA [facility] Date of Exam: 08/10/2021 Medical Rec #:  [id number]       Height:       68.0 in Accession #:    [id number]      Weight:       209.0 lb Date of Birth:  11-21-1933       BSA:          2.082 m Patient Age:    23 years        BP:           112/68 mmHg Patient Gender: F               HR:           86 bpm. Exam Location:  [place] Procedure: 2D Echo, 3D Echo, Cardiac Doppler, Color Doppler and Strain Analysis Indications:    R06.02 Shortness of breath  History:        Patient has prior history of Echocardiogram examinations, most                 recent 12/31/2017. CHF, Pacemaker, Stroke, Arrythmias:Atrial                 Fibrillation and Bradycardia; Risk Factors:Hypertension, Sleep                 Apnea, Dyslipidemia and Former Smoker. Sinus node dysfunction.                 Obesity.  Sonographer:    Basilia Jumbo BS,  RDCS Referring Phys: [place]  1. Left ventricular ejection fraction, by estimation, is 55 to 60%. The left ventricle has normal function. The left ventricle has no regional wall motion abnormalities. There is mild asymmetric left ventricular hypertrophy of the basal and septal segments. Left ventricular diastolic parameters are consistent with Grade I diastolic dysfunction (impaired relaxation). The average left ventricular global longitudinal strain is -20.1 %. The global longitudinal strain is normal.  2. Pacing wires in RA/RV . Right ventricular systolic function is normal. The right ventricular size is normal. There is moderately elevated pulmonary artery systolic pressure.  3. Left atrial size was moderately dilated.  4. Right atrial size was moderately dilated.  5. The mitral valve is degenerative. Mild to moderate mitral valve regurgitation. No evidence of mitral  stenosis.  6. Tricuspid valve regurgitation is mild to moderate.  7. The aortic valve is tricuspid. There is moderate calcification of the aortic valve. There is moderate thickening of the aortic valve. Aortic valve regurgitation is mild. Aortic valve sclerosis/calcification is present, without any evidence of aortic stenosis.  8. Aortic dilatation noted. There is mild dilatation of the ascending aorta, measuring 38 mm.  9. The inferior vena cava is normal in size with greater than 50% respiratory variability, suggesting right atrial pressure of 3 mmHg. Comparison(s): 12/31/17 EF 55-60%. FINDINGS  Left Ventricle: Left ventricular ejection fraction, by estimation, is 55 to 60%. The left ventricle has normal function. The left ventricle has no regional wall motion abnormalities. The average left ventricular global longitudinal strain is -20.1 %. The global longitudinal strain is normal. The left ventricular internal cavity size was normal in size. There is mild asymmetric left ventricular hypertrophy of the basal and septal segments. Left ventricular diastolic parameters are consistent with Grade I diastolic dysfunction (impaired relaxation). Right Ventricle: Pacing wires in RA/RV. The right ventricular size is normal. No increase in right ventricular wall thickness. Right ventricular systolic function is normal. There is moderately elevated pulmonary artery systolic pressure. The tricuspid regurgitant velocity is 3.35 m/s, and with an assumed right atrial pressure of 3 mmHg, the estimated right ventricular systolic pressure is 29.9 mmHg. Left Atrium: Left atrial size was moderately dilated. Right Atrium: Right atrial size was moderately dilated. Pericardium: There is no evidence of pericardial effusion. Mitral Valve: The mitral valve is degenerative in appearance. There is mild thickening of the mitral valve leaflet(s). There is mild calcification of the mitral valve leaflet(s). Mild to moderate mitral valve  regurgitation. No evidence of mitral valve stenosis. Tricuspid Valve: The tricuspid valve is normal in structure. Tricuspid valve regurgitation is mild to moderate. No evidence of tricuspid stenosis. Aortic Valve: The aortic valve is tricuspid. There is moderate calcification of the aortic valve. There is moderate thickening of the aortic valve. Aortic valve regurgitation is mild. Aortic regurgitation PHT measures 513 msec. Aortic valve sclerosis/calcification is present, without any evidence of aortic stenosis. Pulmonic Valve: The pulmonic valve was normal in structure. Pulmonic valve regurgitation is mild. No evidence of pulmonic stenosis. Aorta: Aortic dilatation noted. There is mild dilatation of the ascending aorta, measuring 38 mm. Venous: The inferior vena cava is normal in size with greater than 50% respiratory variability, suggesting right atrial pressure of 3 mmHg. IAS/Shunts: No atrial level shunt detected by color flow Doppler. Additional Comments: A device lead is visualized.  LEFT VENTRICLE PLAX 2D LVIDd:         4.90 cm LVIDs:         3.10 cm  2D Longitudinal Strain LV PW:         1.00 cm   2D Strain GLS (A2C):   -23.6 % LV IVS:        1.00 cm   2D Strain GLS (A3C):   -17.5 % LVOT diam:     2.10 cm   2D Strain GLS (A4C):   -19.1 % LV SV:         74        2D Strain GLS Avg:     -20.1 % LV SV Index:   35 LVOT Area:     3.46 cm                           3D Volume EF:                          3D EF:        55 %                          LV EDV:       122 ml                          LV ESV:       55 ml                          LV SV:        68 ml RIGHT VENTRICLE             IVC RV Basal diam:  4.40 cm     IVC diam: 1.80 cm RV Mid diam:    4.10 cm RV S prime:     15.20 cm/s TAPSE (M-mode): 2.4 cm RVSP:           47.9 mmHg LEFT ATRIUM             Index        RIGHT ATRIUM           Index LA diam:        5.10 cm 2.45 cm/m   RA Pressure: 3.00 mmHg LA Vol (A2C):   95.0 ml 45.62 ml/m  RA Area:     27.90 cm  LA Vol (A4C):   92.1 ml 44.23 ml/m  RA Volume:   99.90 ml  47.97 ml/m LA Biplane Vol: 94.2 ml 45.24 ml/m  AORTIC VALVE LVOT Vmax:   91.10 cm/s LVOT Vmean:  62.700 cm/s LVOT VTI:    0.213 m AI PHT:      513 msec  AORTA Ao Root diam: 3.00 cm Ao Asc diam:  3.80 cm TRICUSPID VALVE TR Peak grad:   44.9 mmHg TR Vmax:        335.00 cm/s Estimated RAP:  3.00 mmHg RVSP:           47.9 mmHg  SHUNTS Systemic VTI:  0.21 m Systemic Diam: 2.10 cm Jenkins Rouge MD Electronically signed by Jenkins Rouge MD Signature Date/Time: 08/10/2021/3:38:40 PM    Final    CUP PACEART REMOTE DEVICE CHECK  Result Date: 08/29/2021 Scheduled remote reviewed. Normal device function.  2 AHR's, known PAF, longest episode 4hrs, controlled ventricular rates Burden 3%, Eliquis, Tikosyn, Metoprolol, Diltiazem Next remote 91 days. LA  DG HIP OPERATIVE UNILAT W OR W/O PELVIS RIGHT  Result  Date: 08/14/2021 CLINICAL DATA:  Right hip arthroplasty EXAM: OPERATIVE right HIP (WITH PELVIS IF PERFORMED) AP VIEWS TECHNIQUE: Fluoroscopic spot image(s) were submitted for interpretation post-operatively. COMPARISON:  None. FINDINGS: Fluoroscopic images show degenerative changes in the right hip in the early images and evidence of right hip arthroplasty in the later images. No definite fractures are seen. Estimated radiation dose is 3.86 mGy. IMPRESSION: Fluoroscopic assistance was provided for right hip arthroplasty. Electronically Signed   By: Elmer Picker M.D.   On: 08/14/2021 11:02      No flowsheet data found.  No results found for: NITRICOXIDE      Assessment & Plan:   Shortness of breath Improved; however, continues to experience DOE with longer distances. Suspect likely r/t deconditioning after having two ortho surgeries. Did have improvement with diuresis and stopping lasix so do believe HF was playing an active roll in worsening. Advised to notify if symptoms worsen being off lasix. Trial PRN albuterol. PFTs scheduled to evaluate  for underlying pulm etiology.   Patient Instructions  -Albuterol inhaler 2 puffs every 6 hours as needed for shortness of breath or wheezing.   Scheduled Pulmonary Function Testing   Lab today - BMET  Continue to use CPAP 8 cmH2O every night, minimum of 6 hours a night. Change equipment every 30 days or as directed by DME. Wash your tubing with warm soap and water daily, hang to dry. Wash humidifier portion weekly.  Maintain clean equipment, as directed by home health agency.  Be aware of reduced alertness and do not drive or operate heavy machinery if experiencing this or drowsiness.  Exercise encouraged, as tolerated. Healthy weight management discussed.  Avoid or decrease alcohol consumption and medications that make you more sleepy, if possible. Notify if persistent daytime sleepiness occurs even with consistent use of CPAP.   Continue wearing TED hose. Encouraged increased activity, as tolerated.  Use Incentive Spirometer 10x/hour while awake if not up moving around  Follow up with cardiology    Follow up after PFTs with Dr. Halford Chessman or Alanson Aly. If symptoms do not improve or worsen, please contact office for sooner follow up or seek emergency care.    OSA on CPAP Compliant with therapy. Continue CPAP 8 cmH2O nightly.   (HFpEF) heart failure with preserved ejection fraction (HCC) LE edema and worsening DOE at previous visit. BNP elevated to 921; BMET with hyponatremia. Suspected symptoms r/t fluid overload. Improved with lasix 40 mg for a week. F/u with cardiology. Will reassess BMET today.   Anemia Possible hemodilution; however, she did require transfusion in the hospital and is chronically anticoagulated. She is supposed to follow up with PCP for further workup.   Clayton Bibles, NP 08/29/2021  Pt aware and understands NP's role.

## 2021-08-29 NOTE — Assessment & Plan Note (Signed)
Improved; however, continues to experience DOE with longer distances. Suspect likely r/t deconditioning after having two ortho surgeries. Did have improvement with diuresis and stopping lasix so do believe HF was playing an active roll in worsening. Advised to notify if symptoms worsen being off lasix. Trial PRN albuterol. PFTs scheduled to evaluate for underlying pulm etiology.   Patient Instructions  -Albuterol inhaler 2 puffs every 6 hours as needed for shortness of breath or wheezing.   Scheduled Pulmonary Function Testing   Lab today - BMET  Continue to use CPAP 8 cmH2O every night, minimum of 6 hours a night. Change equipment every 30 days or as directed by DME. Wash your tubing with warm soap and water daily, hang to dry. Wash humidifier portion weekly.  Maintain clean equipment, as directed by home health agency.  Be aware of reduced alertness and do not drive or operate heavy machinery if experiencing this or drowsiness.  Exercise encouraged, as tolerated. Healthy weight management discussed.  Avoid or decrease alcohol consumption and medications that make you more sleepy, if possible. Notify if persistent daytime sleepiness occurs even with consistent use of CPAP.   Continue wearing TED hose. Encouraged increased activity, as tolerated.  Use Incentive Spirometer 10x/hour while awake if not up moving around  Follow up with cardiology    Follow up after PFTs with Dr. Halford Chessman or Alanson Aly. If symptoms do not improve or worsen, please contact office for sooner follow up or seek emergency care.

## 2021-08-29 NOTE — Patient Instructions (Addendum)
-  Albuterol inhaler 2 puffs every 6 hours as needed for shortness of breath or wheezing.   Scheduled Pulmonary Function Testing   Lab today - BMET  Continue to use CPAP 8 cmH2O every night, minimum of 6 hours a night. Change equipment every 30 days or as directed by DME. Wash your tubing with warm soap and water daily, hang to dry. Wash humidifier portion weekly.  Maintain clean equipment, as directed by home health agency.  Be aware of reduced alertness and do not drive or operate heavy machinery if experiencing this or drowsiness.  Exercise encouraged, as tolerated. Healthy weight management discussed.  Avoid or decrease alcohol consumption and medications that make you more sleepy, if possible. Notify if persistent daytime sleepiness occurs even with consistent use of CPAP.   Continue wearing TED hose. Encouraged increased activity, as tolerated.  Use Incentive Spirometer 10x/hour while awake if not up moving around  Follow up with cardiology    Follow up after PFTs with Dr. Halford Chessman or Alanson Aly. If symptoms do not improve or worsen, please contact office for sooner follow up or seek emergency care.

## 2021-08-29 NOTE — Assessment & Plan Note (Signed)
Compliant with therapy. Continue CPAP 8 cmH2O nightly.

## 2021-08-29 NOTE — Assessment & Plan Note (Signed)
Possible hemodilution; however, she did require transfusion in the hospital and is chronically anticoagulated. She is supposed to follow up with PCP for further workup.

## 2021-08-30 ENCOUNTER — Ambulatory Visit: Payer: Medicare Other | Admitting: Nurse Practitioner

## 2021-08-30 DIAGNOSIS — I482 Chronic atrial fibrillation, unspecified: Secondary | ICD-10-CM | POA: Diagnosis not present

## 2021-08-30 DIAGNOSIS — R2689 Other abnormalities of gait and mobility: Secondary | ICD-10-CM | POA: Diagnosis not present

## 2021-08-30 DIAGNOSIS — Z471 Aftercare following joint replacement surgery: Secondary | ICD-10-CM | POA: Diagnosis not present

## 2021-08-30 DIAGNOSIS — M1990 Unspecified osteoarthritis, unspecified site: Secondary | ICD-10-CM | POA: Diagnosis not present

## 2021-08-30 DIAGNOSIS — M6281 Muscle weakness (generalized): Secondary | ICD-10-CM | POA: Diagnosis not present

## 2021-08-30 LAB — BASIC METABOLIC PANEL
BUN: 11 mg/dL (ref 6–23)
CO2: 31 mEq/L (ref 19–32)
Calcium: 10 mg/dL (ref 8.4–10.5)
Chloride: 100 mEq/L (ref 96–112)
Creatinine, Ser: 0.71 mg/dL (ref 0.40–1.20)
GFR: 77.58 mL/min (ref >60.00)
Glucose, Bld: 110 mg/dL — ABNORMAL HIGH (ref 70–99)
Potassium: 4.4 mEq/L (ref 3.5–5.1)
Sodium: 138 mEq/L (ref 135–145)

## 2021-08-31 DIAGNOSIS — Z471 Aftercare following joint replacement surgery: Secondary | ICD-10-CM | POA: Diagnosis not present

## 2021-08-31 DIAGNOSIS — M6281 Muscle weakness (generalized): Secondary | ICD-10-CM | POA: Diagnosis not present

## 2021-08-31 DIAGNOSIS — M1990 Unspecified osteoarthritis, unspecified site: Secondary | ICD-10-CM | POA: Diagnosis not present

## 2021-08-31 DIAGNOSIS — I482 Chronic atrial fibrillation, unspecified: Secondary | ICD-10-CM | POA: Diagnosis not present

## 2021-08-31 DIAGNOSIS — R2689 Other abnormalities of gait and mobility: Secondary | ICD-10-CM | POA: Diagnosis not present

## 2021-09-03 ENCOUNTER — Telehealth: Payer: Self-pay | Admitting: Internal Medicine

## 2021-09-03 ENCOUNTER — Encounter: Payer: Self-pay | Admitting: Internal Medicine

## 2021-09-03 DIAGNOSIS — M6281 Muscle weakness (generalized): Secondary | ICD-10-CM | POA: Diagnosis not present

## 2021-09-03 DIAGNOSIS — R0602 Shortness of breath: Secondary | ICD-10-CM

## 2021-09-03 DIAGNOSIS — M1990 Unspecified osteoarthritis, unspecified site: Secondary | ICD-10-CM | POA: Diagnosis not present

## 2021-09-03 DIAGNOSIS — R2689 Other abnormalities of gait and mobility: Secondary | ICD-10-CM | POA: Diagnosis not present

## 2021-09-03 DIAGNOSIS — I482 Chronic atrial fibrillation, unspecified: Secondary | ICD-10-CM | POA: Diagnosis not present

## 2021-09-03 DIAGNOSIS — Z471 Aftercare following joint replacement surgery: Secondary | ICD-10-CM | POA: Diagnosis not present

## 2021-09-03 NOTE — Telephone Encounter (Signed)
Spoke with pt who reports she continues to have SOB on exertion.  She cannot say if she had improvement with her SOB taking the Furosemide or not.  She states she would "guess there were some improvement" but cannot say for sure.  She continues to have swelling in her right leg which she had hip replacement 08/14/2021.  Pt does not sound SOB on the phone during conversation. Will forward to Dr Caryl Comes for further recommendation.  Pt verbalizes understanding and agrees with current plan.

## 2021-09-03 NOTE — Telephone Encounter (Signed)
Spoke with pt and advised of Dr Olin Pia recommendation for repeat BNP.  Appointment scheduled for 09/04/2021 for lab work.  Pt verbalizes understanding and agrees with current plan.

## 2021-09-03 NOTE — Telephone Encounter (Signed)
Pt c/o Shortness Of Breath: STAT if SOB developed within the last 24 hours or pt is noticeably SOB on the phone  1. Are you currently SOB (can you hear that pt is SOB on the phone)? Not at this minute  2. How long have you been experiencing SOB?   after the 24th of January  3. Are you SOB when sitting or when up moving around? Moving around  4. Are you currently experiencing any other symptoms? No other symptoms at this time. She says she wonder if her Lasix needs to be increased

## 2021-09-03 NOTE — Progress Notes (Signed)
Remote pacemaker transmission.   

## 2021-09-03 NOTE — Telephone Encounter (Signed)
Patient complaining about shortness of breath since Jan 24 when moving around. From last OV 08/23/21 with Caryl Comes:   furosemide (LASIX) 40 MG tablet Take 1 tablet (40 mg total) by mouth daily for 7 days.

## 2021-09-03 NOTE — Telephone Encounter (Signed)
Lets repeat her BnP and see what it shows

## 2021-09-04 ENCOUNTER — Other Ambulatory Visit: Payer: Medicare Other | Admitting: *Deleted

## 2021-09-04 ENCOUNTER — Other Ambulatory Visit: Payer: Self-pay

## 2021-09-04 DIAGNOSIS — M6281 Muscle weakness (generalized): Secondary | ICD-10-CM | POA: Diagnosis not present

## 2021-09-04 DIAGNOSIS — R2689 Other abnormalities of gait and mobility: Secondary | ICD-10-CM | POA: Diagnosis not present

## 2021-09-04 DIAGNOSIS — M1990 Unspecified osteoarthritis, unspecified site: Secondary | ICD-10-CM | POA: Diagnosis not present

## 2021-09-04 DIAGNOSIS — Z471 Aftercare following joint replacement surgery: Secondary | ICD-10-CM | POA: Diagnosis not present

## 2021-09-04 DIAGNOSIS — I482 Chronic atrial fibrillation, unspecified: Secondary | ICD-10-CM | POA: Diagnosis not present

## 2021-09-04 DIAGNOSIS — R0602 Shortness of breath: Secondary | ICD-10-CM

## 2021-09-05 ENCOUNTER — Ambulatory Visit: Payer: Medicare Other | Admitting: Nurse Practitioner

## 2021-09-05 ENCOUNTER — Telehealth: Payer: Self-pay | Admitting: Internal Medicine

## 2021-09-05 DIAGNOSIS — Z79899 Other long term (current) drug therapy: Secondary | ICD-10-CM

## 2021-09-05 DIAGNOSIS — R0602 Shortness of breath: Secondary | ICD-10-CM

## 2021-09-05 DIAGNOSIS — R609 Edema, unspecified: Secondary | ICD-10-CM

## 2021-09-05 LAB — PRO B NATRIURETIC PEPTIDE: NT-Pro BNP: 3187 pg/mL — ABNORMAL HIGH (ref 0–738)

## 2021-09-05 NOTE — Telephone Encounter (Signed)
° °  Pt is calling regarding his lab work done yesterday, she is concern of the result and would like to speak with a nurse

## 2021-09-05 NOTE — Telephone Encounter (Signed)
Pt aware abn lab result but awaiting MD review/advisement. Pt aware it will be tomorrow before follow up on this. Patient verbalized understanding and agreeable to plan.

## 2021-09-06 ENCOUNTER — Encounter: Payer: Self-pay | Admitting: Internal Medicine

## 2021-09-06 DIAGNOSIS — M6281 Muscle weakness (generalized): Secondary | ICD-10-CM | POA: Diagnosis not present

## 2021-09-06 DIAGNOSIS — Z471 Aftercare following joint replacement surgery: Secondary | ICD-10-CM | POA: Diagnosis not present

## 2021-09-06 DIAGNOSIS — I482 Chronic atrial fibrillation, unspecified: Secondary | ICD-10-CM | POA: Diagnosis not present

## 2021-09-06 DIAGNOSIS — R2689 Other abnormalities of gait and mobility: Secondary | ICD-10-CM | POA: Diagnosis not present

## 2021-09-06 DIAGNOSIS — M1990 Unspecified osteoarthritis, unspecified site: Secondary | ICD-10-CM | POA: Diagnosis not present

## 2021-09-07 MED ORDER — TORSEMIDE 20 MG PO TABS: ORAL_TABLET | ORAL | 0 refills | Status: DC

## 2021-09-07 NOTE — Telephone Encounter (Signed)
Attempted phone call to pt..  OK per Epic to leave detailed voicemail message. Advised per Dr Caryl Comes begin Torsemide 20mg  - 1 tablet by mouth daily x 5 days and recheck BNP next week.  Appointment scheduled for Friday 09/14/2021.  Pt advised to contact RN through [place] or at [phone].

## 2021-09-11 DIAGNOSIS — I482 Chronic atrial fibrillation, unspecified: Secondary | ICD-10-CM | POA: Diagnosis not present

## 2021-09-11 DIAGNOSIS — M6281 Muscle weakness (generalized): Secondary | ICD-10-CM | POA: Diagnosis not present

## 2021-09-11 DIAGNOSIS — M1990 Unspecified osteoarthritis, unspecified site: Secondary | ICD-10-CM | POA: Diagnosis not present

## 2021-09-11 DIAGNOSIS — R2689 Other abnormalities of gait and mobility: Secondary | ICD-10-CM | POA: Diagnosis not present

## 2021-09-11 DIAGNOSIS — Z471 Aftercare following joint replacement surgery: Secondary | ICD-10-CM | POA: Diagnosis not present

## 2021-09-12 DIAGNOSIS — I482 Chronic atrial fibrillation, unspecified: Secondary | ICD-10-CM | POA: Diagnosis not present

## 2021-09-12 DIAGNOSIS — R2689 Other abnormalities of gait and mobility: Secondary | ICD-10-CM | POA: Diagnosis not present

## 2021-09-12 DIAGNOSIS — M1990 Unspecified osteoarthritis, unspecified site: Secondary | ICD-10-CM | POA: Diagnosis not present

## 2021-09-12 DIAGNOSIS — M6281 Muscle weakness (generalized): Secondary | ICD-10-CM | POA: Diagnosis not present

## 2021-09-12 DIAGNOSIS — Z471 Aftercare following joint replacement surgery: Secondary | ICD-10-CM | POA: Diagnosis not present

## 2021-09-12 NOTE — Telephone Encounter (Signed)
Spoke with pt who reports she feels SOB has improved taking Torsemide 20mg .  Pt is due for labs on 09/14/2021.  Pt verbalizes understanding and thanked Therapist, sports for calling to check on her.

## 2021-09-13 DIAGNOSIS — M1990 Unspecified osteoarthritis, unspecified site: Secondary | ICD-10-CM | POA: Diagnosis not present

## 2021-09-13 DIAGNOSIS — M6281 Muscle weakness (generalized): Secondary | ICD-10-CM | POA: Diagnosis not present

## 2021-09-13 DIAGNOSIS — R2689 Other abnormalities of gait and mobility: Secondary | ICD-10-CM | POA: Diagnosis not present

## 2021-09-13 DIAGNOSIS — I482 Chronic atrial fibrillation, unspecified: Secondary | ICD-10-CM | POA: Diagnosis not present

## 2021-09-13 DIAGNOSIS — Z471 Aftercare following joint replacement surgery: Secondary | ICD-10-CM | POA: Diagnosis not present

## 2021-09-14 ENCOUNTER — Other Ambulatory Visit: Payer: Self-pay

## 2021-09-14 ENCOUNTER — Other Ambulatory Visit: Payer: Medicare Other | Admitting: *Deleted

## 2021-09-14 DIAGNOSIS — Z79899 Other long term (current) drug therapy: Secondary | ICD-10-CM

## 2021-09-14 DIAGNOSIS — R609 Edema, unspecified: Secondary | ICD-10-CM

## 2021-09-14 DIAGNOSIS — R0602 Shortness of breath: Secondary | ICD-10-CM

## 2021-09-15 LAB — PRO B NATRIURETIC PEPTIDE: NT-Pro BNP: 2448 pg/mL — ABNORMAL HIGH (ref 0–738)

## 2021-09-17 DIAGNOSIS — M1990 Unspecified osteoarthritis, unspecified site: Secondary | ICD-10-CM | POA: Diagnosis not present

## 2021-09-17 DIAGNOSIS — R2689 Other abnormalities of gait and mobility: Secondary | ICD-10-CM | POA: Diagnosis not present

## 2021-09-17 DIAGNOSIS — M6281 Muscle weakness (generalized): Secondary | ICD-10-CM | POA: Diagnosis not present

## 2021-09-17 DIAGNOSIS — I10 Essential (primary) hypertension: Secondary | ICD-10-CM | POA: Diagnosis not present

## 2021-09-17 DIAGNOSIS — I482 Chronic atrial fibrillation, unspecified: Secondary | ICD-10-CM | POA: Diagnosis not present

## 2021-09-17 DIAGNOSIS — Z471 Aftercare following joint replacement surgery: Secondary | ICD-10-CM | POA: Diagnosis not present

## 2021-09-18 ENCOUNTER — Encounter: Payer: Medicare Other | Admitting: Internal Medicine

## 2021-09-19 DIAGNOSIS — I482 Chronic atrial fibrillation, unspecified: Secondary | ICD-10-CM | POA: Diagnosis not present

## 2021-09-19 DIAGNOSIS — M1990 Unspecified osteoarthritis, unspecified site: Secondary | ICD-10-CM | POA: Diagnosis not present

## 2021-09-19 DIAGNOSIS — M6281 Muscle weakness (generalized): Secondary | ICD-10-CM | POA: Diagnosis not present

## 2021-09-19 DIAGNOSIS — R2689 Other abnormalities of gait and mobility: Secondary | ICD-10-CM | POA: Diagnosis not present

## 2021-09-19 DIAGNOSIS — Z471 Aftercare following joint replacement surgery: Secondary | ICD-10-CM | POA: Diagnosis not present

## 2021-09-21 DIAGNOSIS — Z471 Aftercare following joint replacement surgery: Secondary | ICD-10-CM | POA: Diagnosis not present

## 2021-09-21 DIAGNOSIS — M1990 Unspecified osteoarthritis, unspecified site: Secondary | ICD-10-CM | POA: Diagnosis not present

## 2021-09-21 DIAGNOSIS — R2689 Other abnormalities of gait and mobility: Secondary | ICD-10-CM | POA: Diagnosis not present

## 2021-09-21 DIAGNOSIS — M6281 Muscle weakness (generalized): Secondary | ICD-10-CM | POA: Diagnosis not present

## 2021-09-21 DIAGNOSIS — I482 Chronic atrial fibrillation, unspecified: Secondary | ICD-10-CM | POA: Diagnosis not present

## 2021-09-23 NOTE — Progress Notes (Signed)
Cardiology Office Note Date:  09/26/2021  Patient ID:  Brittany [name], Brittany [name] 1933/08/23, MRN [id number] PCP:  Donald Prose, MD  Electrophysiologist: Dr. Caryl Comes    Chief Complaint: SOB  History of Present Illness: Brittany [name] is a 88 y.o. female with history of stroke, SSSx w/PPM, Afib, ATach, HTN, HLD, hypothyroidism, OSA w/CPAP,    07/30/21 Hgb 13.4 > HIP SURGERY  on 1/24 > 1/25 8.1, 9.0 > 08/22/21 8.8  She comes in today to be seen for Dr. Caryl Comes, last seen by him 08/23/21, she had recently seen pulmonary with symptoms /signs of volume OL and started on furosemide. C/o palpitations, DOE as well as orthopedic pains He was unconvinced of HFpEF, though recommended to give the lasix a try for a week.  Also noted persistent HR 80's, suspect AT and her metoprolol increased to '150mg'$  BID, her ACE stopped Tikosyn continued  Saw pulm team 08/29/21, SOB better but not resoled, edema was resolved.  She was off lasix (at Dr. Olin Pia recommendation), suspected volume did play a roll though as well as deconditioning after orthopedic surgeries. Trial of prn albuterol   Pt called/my chart messages with concerns of lab results and ongoing SOB, started on torsemide '20mg'$  daily for 5 days and repeat BNP   BNP 3187 > 2448  TODAY She is accompanied by her husband today. She reports that last year after her knee surgery she noticed a little SOB, but not much and tolerable, after her hip surgery though she has been markedly SOB with minimal exertion. The albuterol did not seem to help The diuretics perhaps though minimally. No CP, no palpitations or cardiac awareness No near syncope or syncope.  She has had some longer episodes of Afib of late, she was unaware of them. Her DOE has been unchanged and persistent sione her hip surgery in January, she has not noticed specific days that she is SOB over others.   Device information MDT dual chamber PPM implanted 07/04/11  AFib Hx Diagnosis goes as far back  as 2-14 or earlier  AAD hx Sotalol started perhaps 2014 or earlier stopped with recurrent Afib Tikosyn started Sep 2020  Past Medical History:  Diagnosis Date   Ankle fracture 2016   Arthritis    Atrial fibrillation ([place])    Depression    Dysrhythmia    Elevated parathyroid hormone    Fracture of orbital floor with routine healing    GERD (gastroesophageal reflux disease)    History of colon polyps    benign   Hypercalcemia    Hypertension    Hyponatremia    Hypothyroid    Macular degeneration    wet in the right and dry in the left   OSA on CPAP    Osteoporosis    Peripheral vascular disease (HCC)    Presence of permanent cardiac pacemaker    PSVT (paroxysmal supraventricular tachycardia) (HCC)    Rotator cuff tear    Sinus node dysfunction (HCC)    a. s/p MDT pacemaker   Stroke [facility])    Urinary incontinence    Wrist fracture 2018    Past Surgical History:  Procedure Laterality Date   BACK SURGERY     CATARACT EXTRACTION     CATARACT EXTRACTION W/ INTRAOCULAR LENS  IMPLANT, BILATERAL Bilateral    COLONOSCOPY     ESOPHAGOGASTRODUODENOSCOPY     IR ANGIO INTRA EXTRACRAN SEL COM CAROTID INNOMINATE BILAT MOD SED  12/30/2017   IR ANGIO VERTEBRAL SEL VERTEBRAL BILAT MOD  SED  12/30/2017   LAPAROSCOPIC CHOLECYSTECTOMY  08/11/1999   LUMBAR [place] SURGERY  02/19/2014   ORIF ANKLE FRACTURE Left 04/13/2015   Procedure: OPEN REDUCTION INTERNAL FIXATION (ORIF) LEFT ANKLE FRACTURE;  Surgeon: Meredith Pel, MD;  Location: [facility];  Service: Orthopedics;  Laterality: Left;   PACEMAKER PLACEMENT Right 07/22/2010   a. MDT dual chamber PPM implanted by Dr Caryl Comes    SHOULDER ARTHROSCOPY W/ ROTATOR CUFF REPAIR Right 08/30/2014   WITH MINI-OPEN ROTATOR CUFF REPAIR AND SUBACROMIAL DECOMPRESSION   SHOULDER ARTHROSCOPY WITH ROTATOR CUFF REPAIR AND SUBACROMIAL DECOMPRESSION Right 08/30/2014   Procedure: SHOULDER ARTHROSCOPY WITH MINI-OPEN ROTATOR CUFF REPAIR AND SUBACROMIAL DECOMPRESSION;   Surgeon: Meredith Pel, MD;  Location: [facility];  Service: Orthopedics;  Laterality: Right;  RIGHT SHOULDER DIAGNOSTIC OPERATIVE ARTHROSCOPY, SUBACROMIAL DECOMPRESSION, MINI-OPEN ROTATOR CUFF REPAIR.   TOTAL HIP ARTHROPLASTY Right 08/14/2021   Procedure: TOTAL HIP ARTHROPLASTY ANTERIOR APPROACH;  Surgeon: Melrose Nakayama, MD;  Location: [facility];  Service: Orthopedics;  Laterality: Right;   TOTAL KNEE ARTHROPLASTY Left 02/13/2021   Procedure: LEFT TOTAL KNEE ARTHROPLASTY;  Surgeon: Melrose Nakayama, MD;  Location: [facility];  Service: Orthopedics;  Laterality: Left;   VAGINAL HYSTERECTOMY  07/22/1968   WRIST FRACTURE SURGERY Left     Current Outpatient Medications  Medication Sig Dispense Refill   acetaminophen (TYLENOL) 500 MG tablet Take 1,000 mg by mouth every 6 (six) hours as needed for moderate pain.     albuterol (VENTOLIN HFA) 108 (90 Base) MCG/ACT inhaler Inhale 2 puffs into the lungs every 6 (six) hours as needed for wheezing or shortness of breath. 8 g 2   alendronate (FOSAMAX) 70 MG tablet Take 1 tablet (70 mg total) by mouth every 7 (seven) days. Take with a full glass of water on an empty stomach. 4 tablet 11   apixaban (ELIQUIS) 5 MG TABS tablet Take 1 tablet (5 mg total) by mouth 2 (two) times daily. 180 tablet 3   B Complex-C (B-COMPLEX WITH VITAMIN C) tablet Take 1 tablet by mouth daily with lunch.      Cholecalciferol (VITAMIN D3) 5000 UNITS CAPS Take 5,000 Units by mouth daily with breakfast.      diltiazem (CARDIZEM CD) 120 MG 24 hr capsule take 1 capsule by mouth every morning and at bedtime 180 capsule 0   dofetilide (TIKOSYN) 500 MCG capsule TAKE ONE CAPSULE BY MOUTH TWICE DAILY 180 capsule 3   Lutein 20 MG TABS Take 20 mg by mouth daily with breakfast.     magnesium oxide (MAG-OX) 400 MG tablet Take 1 tablet (400 mg total) by mouth daily. 30 tablet 0   Melatonin 10 MG TABS Take 10-40 mg by mouth at bedtime as needed (sleep).     metoprolol succinate (TOPROL-XL) 50 MG 24 hr  tablet Take 2 tablets (100 mg total) by mouth 3 (three) times daily. Take with or immediately following a meal. 540 tablet 3   Multiple Vitamin (MULTIVITAMIN WITH MINERALS) TABS tablet Take 1 tablet by mouth daily with breakfast.      Multiple Vitamins-Minerals (PRESERVISION AREDS 2 PO) Take 1 capsule by mouth 2 (two) times daily with a meal.      PRESCRIPTION MEDICATION Inhale into the lungs at bedtime. CPAP     sertraline (ZOLOFT) 50 MG tablet Take 1 tablet (50 mg total) by mouth daily. 30 tablet 2   No current facility-administered medications for this visit.    Allergies:   Citalopram hydrobromide, Adhesive [tape], Bacitracin-polymyxin b, Benzalkonium chloride,  and Neosporin [neomycin-polymyxin-gramicidin]   Social History:  The patient  reports that she quit smoking about 31 years ago. Her smoking use included cigarettes. She has a 45.00 pack-year smoking history. She has never used smokeless tobacco. She reports current alcohol use of about 28.0 standard drinks per week. She reports that she does not use drugs.   Family History:  The patient's family history includes Asthma in her brother; Breast cancer in her paternal aunt; Cancer in her father; Colon cancer in her mother; Heart disease in her brother; Skin cancer in her brother; Stroke in her sister.  ROS:  Please see the history of present illness.    All other systems are reviewed and otherwise negative.   PHYSICAL EXAM:  VS:  BP (!) 148/64    Pulse 67    Ht '5\' 8"'$  (1.727 m)    Wt 215 lb 9.6 oz (97.8 kg)    LMP 07/22/1968    SpO2 97%    BMI 32.78 kg/m  BMI: Body mass index is 32.78 kg/m. Well nourished, well developed, in no acute distress HEENT: normocephalic, atraumatic Neck: no JVD, carotid bruits or masses Cardiac:  RRR; no significant murmurs, no rubs, or gallops Lungs:  CTA b/l, no wheezing, rhonchi or rales Abd: soft, nontender MS: no deformity or atrophy Ext: no edema Skin: warm and dry, no rash Neuro:  No gross  deficits appreciated Psych: euthymic mood, full affect  PPM site is stable, no tethering or discomfort   EKG:  done today and reviewed by myself AP/VS, 67bpm, Qtc 458m  Device interrogation done today and reviewed by myself:  Battery estimate is 123moead measurements are good AF burden since 08/23/21 is 14%, longest 23 hours No HVR episodes   08/10/21: TTE 1. Left ventricular ejection fraction, by estimation, is 55 to 60%. The  left ventricle has normal function. The left ventricle has no regional  wall motion abnormalities. There is mild asymmetric left ventricular  hypertrophy of the basal and septal  segments. Left ventricular diastolic parameters are consistent with Grade  I diastolic dysfunction (impaired relaxation). The average left  ventricular global longitudinal strain is -20.1 %. The global longitudinal  strain is normal.   2. Pacing wires in RA/RV . Right ventricular systolic function is normal.  The right ventricular size is normal. There is moderately elevated  pulmonary artery systolic pressure.   3. Left atrial size was moderately dilated.   4. Right atrial size was moderately dilated.   5. The mitral valve is degenerative. Mild to moderate mitral valve  regurgitation. No evidence of mitral stenosis.   6. Tricuspid valve regurgitation is mild to moderate.   7. The aortic valve is tricuspid. There is moderate calcification of the  aortic valve. There is moderate thickening of the aortic valve. Aortic  valve regurgitation is mild. Aortic valve sclerosis/calcification is  present, without any evidence of aortic  stenosis.   8. Aortic dilatation noted. There is mild dilatation of the ascending  aorta, measuring 38 mm.   9. The inferior vena cava is normal in size with greater than 50%  respiratory variability, suggesting right atrial pressure of 3 mmHg.   Comparison(s): 12/31/17 EF 55-60%.    Recent Labs: 03/06/2021: Magnesium 2.0 07/30/2021: TSH  3.120 08/22/2021: Hemoglobin 8.8 Repeated and verified X2.; Platelets 410.0 08/29/2021: BUN 11; Creatinine, Ser 0.71; Potassium 4.4; Sodium 138 09/14/2021: NT-Pro BNP 2,448  No results found for requested labs within last 8760 hours.   CrCl cannot be calculated (  Patient's most recent lab result is older than the maximum 21 days allowed.).   Wt Readings from Last 3 Encounters:  09/26/21 215 lb 9.6 oz (97.8 kg)  08/29/21 213 lb 12.8 oz (97 kg)  08/22/21 208 lb (94.3 kg)     Other studies reviewed: Additional studies/records reviewed today include: summarized above  ASSESSMENT AND PLAN:  PPM Intact function No programming changes made  Paroxysmal AFib ATach CHA2DS2Vasc is 6, on Eliquis, appropriately dosed Tikosyn QTc looks OK Increased burden, perhaps 2/2 recent hip surgery and anemia  HTN No changes today  SOB Likely multifactorial Very abnormal BNP c/w volume IL Marked reduction in her H/H post orthopedic surgery  She reports they gave her a transfusion after surgery, but no one has mentioned concerns about her blood count since  She does not appear clearly or markedly volume OL I think we see where her H/H is, check her labs and go from there  She has preserved LVEF with some mild DD, and if anemia is improved we can put her on daily diuretic CXR 21/23 did not have edema, effusions    Disposition: F/u with 6 weeks, sooner if needed pending labs  Current medicines are reviewed at length with the patient today.  The patient did not have any concerns regarding medicines.  Venetia Night, PA-C 09/26/2021 3:00 PM     [place] [address] [phone] (office)  [phone] (fax)

## 2021-09-24 DIAGNOSIS — Z471 Aftercare following joint replacement surgery: Secondary | ICD-10-CM | POA: Diagnosis not present

## 2021-09-24 DIAGNOSIS — R2689 Other abnormalities of gait and mobility: Secondary | ICD-10-CM | POA: Diagnosis not present

## 2021-09-24 DIAGNOSIS — M6281 Muscle weakness (generalized): Secondary | ICD-10-CM | POA: Diagnosis not present

## 2021-09-24 DIAGNOSIS — M1990 Unspecified osteoarthritis, unspecified site: Secondary | ICD-10-CM | POA: Diagnosis not present

## 2021-09-24 DIAGNOSIS — I482 Chronic atrial fibrillation, unspecified: Secondary | ICD-10-CM | POA: Diagnosis not present

## 2021-09-26 ENCOUNTER — Other Ambulatory Visit: Payer: Self-pay

## 2021-09-26 ENCOUNTER — Ambulatory Visit (INDEPENDENT_AMBULATORY_CARE_PROVIDER_SITE_OTHER): Payer: Medicare Other | Admitting: Physician Assistant

## 2021-09-26 ENCOUNTER — Encounter: Payer: Self-pay | Admitting: Physician Assistant

## 2021-09-26 VITALS — BP 148/64 | HR 67 | Ht 68.0 in | Wt 215.6 lb

## 2021-09-26 DIAGNOSIS — I5030 Unspecified diastolic (congestive) heart failure: Secondary | ICD-10-CM | POA: Diagnosis not present

## 2021-09-26 DIAGNOSIS — R001 Bradycardia, unspecified: Secondary | ICD-10-CM

## 2021-09-26 DIAGNOSIS — I480 Paroxysmal atrial fibrillation: Secondary | ICD-10-CM

## 2021-09-26 DIAGNOSIS — M6281 Muscle weakness (generalized): Secondary | ICD-10-CM | POA: Diagnosis not present

## 2021-09-26 DIAGNOSIS — Z79899 Other long term (current) drug therapy: Secondary | ICD-10-CM | POA: Diagnosis not present

## 2021-09-26 DIAGNOSIS — I471 Supraventricular tachycardia: Secondary | ICD-10-CM | POA: Diagnosis not present

## 2021-09-26 DIAGNOSIS — I4819 Other persistent atrial fibrillation: Secondary | ICD-10-CM | POA: Diagnosis not present

## 2021-09-26 DIAGNOSIS — Z950 Presence of cardiac pacemaker: Secondary | ICD-10-CM | POA: Diagnosis not present

## 2021-09-26 DIAGNOSIS — R0602 Shortness of breath: Secondary | ICD-10-CM | POA: Diagnosis not present

## 2021-09-26 DIAGNOSIS — M1990 Unspecified osteoarthritis, unspecified site: Secondary | ICD-10-CM | POA: Diagnosis not present

## 2021-09-26 DIAGNOSIS — I4719 Other supraventricular tachycardia: Secondary | ICD-10-CM

## 2021-09-26 DIAGNOSIS — I482 Chronic atrial fibrillation, unspecified: Secondary | ICD-10-CM | POA: Diagnosis not present

## 2021-09-26 DIAGNOSIS — R2689 Other abnormalities of gait and mobility: Secondary | ICD-10-CM | POA: Diagnosis not present

## 2021-09-26 DIAGNOSIS — Z471 Aftercare following joint replacement surgery: Secondary | ICD-10-CM | POA: Diagnosis not present

## 2021-09-26 DIAGNOSIS — I4891 Unspecified atrial fibrillation: Secondary | ICD-10-CM | POA: Diagnosis not present

## 2021-09-26 LAB — CUP PACEART INCLINIC DEVICE CHECK
Battery Impedance: 4015 Ohm
Battery Remaining Longevity: 12 mo
Battery Voltage: 2.7 V
Brady Statistic AP VP Percent: 2 %
Brady Statistic AP VS Percent: 20 %
Brady Statistic AS VP Percent: 3 %
Brady Statistic AS VS Percent: 75 %
Date Time Interrogation Session: 20230308180135
Implantable Lead Implant Date: 20121213
Implantable Lead Implant Date: 20121213
Implantable Lead Location: 753859
Implantable Lead Location: 753860
Implantable Lead Model: 4076
Implantable Lead Model: 4076
Implantable Lead Serial Number: 835025
Implantable Lead Serial Number: 869269
Implantable Pulse Generator Implant Date: 20121213
Lead Channel Impedance Value: 363 Ohm
Lead Channel Impedance Value: 488 Ohm
Lead Channel Pacing Threshold Amplitude: 0.5 V
Lead Channel Pacing Threshold Amplitude: 0.5 V
Lead Channel Pacing Threshold Amplitude: 0.875 V
Lead Channel Pacing Threshold Amplitude: 1 V
Lead Channel Pacing Threshold Pulse Width: 0.4 ms
Lead Channel Pacing Threshold Pulse Width: 0.4 ms
Lead Channel Pacing Threshold Pulse Width: 0.4 ms
Lead Channel Pacing Threshold Pulse Width: 0.4 ms
Lead Channel Sensing Intrinsic Amplitude: 11.2 mV
Lead Channel Setting Pacing Amplitude: 2 V
Lead Channel Setting Pacing Amplitude: 2.5 V
Lead Channel Setting Pacing Pulse Width: 0.4 ms
Lead Channel Setting Sensing Sensitivity: 4 mV

## 2021-09-26 NOTE — Patient Instructions (Addendum)
Medication Instructions:  ? ?Your physician recommends that you continue on your current medications as directed. Please refer to the Current Medication list given to you today. ? ? ?*If you need a refill on your cardiac medications before your next appointment, please call your pharmacy* ? ? ?Lab Work:   BMET  MAG BNP AND CBC TODAY  ? ?If you have labs (blood work) drawn today and your tests are completely normal, you will receive your results only by: ?MyChart Message (if you have MyChart) OR ?A paper copy in the mail ?If you have any lab test that is abnormal or we need to change your treatment, we will call you to review the results. ? ? ?Testing/Procedures:  NONE ORDERED  TODAY ? ? ? ?Follow-Up: ?At [facility], you and your health needs are our priority.  As part of our continuing mission to provide you with exceptional heart care, we have created designated Provider Care Teams.  These Care Teams include your primary Cardiologist (physician) and Advanced Practice Providers (APPs -  Physician Assistants and Nurse Practitioners) who all work together to provide you with the care you need, when you need it. ? ?We recommend signing up for the patient portal called "MyChart".  Sign up information is provided on this After Visit Summary.  MyChart is used to connect with patients for Virtual Visits (Telemedicine).  Patients are able to view lab/test results, encounter notes, upcoming appointments, etc.  Non-urgent messages can be sent to your provider as well.   ?To learn more about what you can do with MyChart, go to [email or web address].   ? ?Your next appointment:   ?6 month(s) ? ?The format for your next appointment:   ?In Person ? ?Provider:   ?You may see Virl Axe, MD or one of the following Advanced Practice Providers on your designated Care Team:   ? ?Tommye Standard, PA-C ? ?  ?Other Instructions ? ?

## 2021-09-27 LAB — CBC
Hematocrit: 35.7 % (ref 34.0–46.6)
Hemoglobin: 11.9 g/dL (ref 11.1–15.9)
MCH: 31.2 pg (ref 26.6–33.0)
MCHC: 33.3 g/dL (ref 31.5–35.7)
MCV: 94 fL (ref 79–97)
Platelets: 282 10*3/uL (ref 150–450)
RBC: 3.82 x10E6/uL (ref 3.77–5.28)
RDW: 12.6 % (ref 11.7–15.4)
WBC: 6.8 10*3/uL (ref 3.4–10.8)

## 2021-09-27 LAB — BASIC METABOLIC PANEL
BUN/Creatinine Ratio: 17 (ref 12–28)
BUN: 12 mg/dL (ref 8–27)
CO2: 25 mmol/L (ref 20–29)
Calcium: 10.2 mg/dL (ref 8.7–10.3)
Chloride: 99 mmol/L (ref 96–106)
Creatinine, Ser: 0.69 mg/dL (ref 0.57–1.00)
Glucose: 105 mg/dL — ABNORMAL HIGH (ref 70–99)
Potassium: 4.4 mmol/L (ref 3.5–5.2)
Sodium: 138 mmol/L (ref 134–144)
eGFR: 85 mL/min/{1.73_m2} (ref >59)

## 2021-09-27 LAB — PRO B NATRIURETIC PEPTIDE: NT-Pro BNP: 2462 pg/mL — ABNORMAL HIGH (ref 0–738)

## 2021-09-27 LAB — MAGNESIUM: Magnesium: 1.9 mg/dL (ref 1.6–2.3)

## 2021-09-28 ENCOUNTER — Other Ambulatory Visit: Payer: Self-pay | Admitting: *Deleted

## 2021-09-28 ENCOUNTER — Telehealth: Payer: Self-pay | Admitting: *Deleted

## 2021-09-28 ENCOUNTER — Telehealth: Payer: Self-pay | Admitting: Internal Medicine

## 2021-09-28 DIAGNOSIS — Z471 Aftercare following joint replacement surgery: Secondary | ICD-10-CM | POA: Diagnosis not present

## 2021-09-28 DIAGNOSIS — R2689 Other abnormalities of gait and mobility: Secondary | ICD-10-CM | POA: Diagnosis not present

## 2021-09-28 DIAGNOSIS — I482 Chronic atrial fibrillation, unspecified: Secondary | ICD-10-CM | POA: Diagnosis not present

## 2021-09-28 DIAGNOSIS — M6281 Muscle weakness (generalized): Secondary | ICD-10-CM | POA: Diagnosis not present

## 2021-09-28 DIAGNOSIS — Z79899 Other long term (current) drug therapy: Secondary | ICD-10-CM

## 2021-09-28 DIAGNOSIS — M1990 Unspecified osteoarthritis, unspecified site: Secondary | ICD-10-CM | POA: Diagnosis not present

## 2021-09-28 MED ORDER — POTASSIUM CHLORIDE ER 10 MEQ PO TBCR: 10.0000 meq | EXTENDED_RELEASE_TABLET | Freq: Every day | ORAL | 3 refills | Status: DC

## 2021-09-28 MED ORDER — FUROSEMIDE 40 MG PO TABS: 40.0000 mg | ORAL_TABLET | Freq: Every day | ORAL | 3 refills | Status: DC

## 2021-09-28 NOTE — Telephone Encounter (Signed)
-----   Message from [facility], [place] sent at 09/27/2021  5:40 PM EST ----- ?Kidney function and electrolytes are OK, blood counts are much improved. This does NOT explain her SOB. ?Her BNP is again elevated and we will need to resume her furosemide, start '40mg'$  daily as well as Kdur 50mq daily, BMET in 1 week. ?

## 2021-09-28 NOTE — Telephone Encounter (Signed)
Pt returning phone call... please advise  

## 2021-10-01 DIAGNOSIS — R2689 Other abnormalities of gait and mobility: Secondary | ICD-10-CM | POA: Diagnosis not present

## 2021-10-01 DIAGNOSIS — M6281 Muscle weakness (generalized): Secondary | ICD-10-CM | POA: Diagnosis not present

## 2021-10-01 DIAGNOSIS — I482 Chronic atrial fibrillation, unspecified: Secondary | ICD-10-CM | POA: Diagnosis not present

## 2021-10-01 DIAGNOSIS — Z471 Aftercare following joint replacement surgery: Secondary | ICD-10-CM | POA: Diagnosis not present

## 2021-10-01 DIAGNOSIS — M1990 Unspecified osteoarthritis, unspecified site: Secondary | ICD-10-CM | POA: Diagnosis not present

## 2021-10-03 DIAGNOSIS — M1990 Unspecified osteoarthritis, unspecified site: Secondary | ICD-10-CM | POA: Diagnosis not present

## 2021-10-03 DIAGNOSIS — R2689 Other abnormalities of gait and mobility: Secondary | ICD-10-CM | POA: Diagnosis not present

## 2021-10-03 DIAGNOSIS — I482 Chronic atrial fibrillation, unspecified: Secondary | ICD-10-CM | POA: Diagnosis not present

## 2021-10-03 DIAGNOSIS — M6281 Muscle weakness (generalized): Secondary | ICD-10-CM | POA: Diagnosis not present

## 2021-10-03 DIAGNOSIS — Z471 Aftercare following joint replacement surgery: Secondary | ICD-10-CM | POA: Diagnosis not present

## 2021-10-05 ENCOUNTER — Other Ambulatory Visit: Payer: Self-pay

## 2021-10-05 ENCOUNTER — Other Ambulatory Visit: Payer: Medicare Other | Admitting: *Deleted

## 2021-10-05 DIAGNOSIS — R2689 Other abnormalities of gait and mobility: Secondary | ICD-10-CM | POA: Diagnosis not present

## 2021-10-05 DIAGNOSIS — I482 Chronic atrial fibrillation, unspecified: Secondary | ICD-10-CM | POA: Diagnosis not present

## 2021-10-05 DIAGNOSIS — M1990 Unspecified osteoarthritis, unspecified site: Secondary | ICD-10-CM | POA: Diagnosis not present

## 2021-10-05 DIAGNOSIS — Z471 Aftercare following joint replacement surgery: Secondary | ICD-10-CM | POA: Diagnosis not present

## 2021-10-05 DIAGNOSIS — Z79899 Other long term (current) drug therapy: Secondary | ICD-10-CM

## 2021-10-05 DIAGNOSIS — M6281 Muscle weakness (generalized): Secondary | ICD-10-CM | POA: Diagnosis not present

## 2021-10-06 LAB — BASIC METABOLIC PANEL
BUN/Creatinine Ratio: 19 (ref 12–28)
BUN: 13 mg/dL (ref 8–27)
CO2: 26 mmol/L (ref 20–29)
Calcium: 10.6 mg/dL — ABNORMAL HIGH (ref 8.7–10.3)
Chloride: 100 mmol/L (ref 96–106)
Creatinine, Ser: 0.68 mg/dL (ref 0.57–1.00)
Glucose: 104 mg/dL — ABNORMAL HIGH (ref 70–99)
Potassium: 4.1 mmol/L (ref 3.5–5.2)
Sodium: 140 mmol/L (ref 134–144)
eGFR: 85 mL/min/{1.73_m2} (ref >59)

## 2021-10-08 DIAGNOSIS — M1990 Unspecified osteoarthritis, unspecified site: Secondary | ICD-10-CM | POA: Diagnosis not present

## 2021-10-08 DIAGNOSIS — R2689 Other abnormalities of gait and mobility: Secondary | ICD-10-CM | POA: Diagnosis not present

## 2021-10-08 DIAGNOSIS — M6281 Muscle weakness (generalized): Secondary | ICD-10-CM | POA: Diagnosis not present

## 2021-10-08 DIAGNOSIS — I482 Chronic atrial fibrillation, unspecified: Secondary | ICD-10-CM | POA: Diagnosis not present

## 2021-10-08 DIAGNOSIS — Z471 Aftercare following joint replacement surgery: Secondary | ICD-10-CM | POA: Diagnosis not present

## 2021-10-10 DIAGNOSIS — M1990 Unspecified osteoarthritis, unspecified site: Secondary | ICD-10-CM | POA: Diagnosis not present

## 2021-10-10 DIAGNOSIS — I482 Chronic atrial fibrillation, unspecified: Secondary | ICD-10-CM | POA: Diagnosis not present

## 2021-10-10 DIAGNOSIS — M6281 Muscle weakness (generalized): Secondary | ICD-10-CM | POA: Diagnosis not present

## 2021-10-10 DIAGNOSIS — M1711 Unilateral primary osteoarthritis, right knee: Secondary | ICD-10-CM | POA: Diagnosis not present

## 2021-10-10 DIAGNOSIS — R2689 Other abnormalities of gait and mobility: Secondary | ICD-10-CM | POA: Diagnosis not present

## 2021-10-10 DIAGNOSIS — Z471 Aftercare following joint replacement surgery: Secondary | ICD-10-CM | POA: Diagnosis not present

## 2021-10-12 DIAGNOSIS — M6281 Muscle weakness (generalized): Secondary | ICD-10-CM | POA: Diagnosis not present

## 2021-10-12 DIAGNOSIS — R2689 Other abnormalities of gait and mobility: Secondary | ICD-10-CM | POA: Diagnosis not present

## 2021-10-12 DIAGNOSIS — Z471 Aftercare following joint replacement surgery: Secondary | ICD-10-CM | POA: Diagnosis not present

## 2021-10-12 DIAGNOSIS — M1990 Unspecified osteoarthritis, unspecified site: Secondary | ICD-10-CM | POA: Diagnosis not present

## 2021-10-12 DIAGNOSIS — I482 Chronic atrial fibrillation, unspecified: Secondary | ICD-10-CM | POA: Diagnosis not present

## 2021-10-15 DIAGNOSIS — M47816 Spondylosis without myelopathy or radiculopathy, lumbar region: Secondary | ICD-10-CM | POA: Diagnosis not present

## 2021-10-15 DIAGNOSIS — M6281 Muscle weakness (generalized): Secondary | ICD-10-CM | POA: Diagnosis not present

## 2021-10-15 DIAGNOSIS — I482 Chronic atrial fibrillation, unspecified: Secondary | ICD-10-CM | POA: Diagnosis not present

## 2021-10-15 DIAGNOSIS — Z471 Aftercare following joint replacement surgery: Secondary | ICD-10-CM | POA: Diagnosis not present

## 2021-10-15 DIAGNOSIS — R2689 Other abnormalities of gait and mobility: Secondary | ICD-10-CM | POA: Diagnosis not present

## 2021-10-15 DIAGNOSIS — M791 Myalgia, unspecified site: Secondary | ICD-10-CM | POA: Diagnosis not present

## 2021-10-15 DIAGNOSIS — M1990 Unspecified osteoarthritis, unspecified site: Secondary | ICD-10-CM | POA: Diagnosis not present

## 2021-10-17 DIAGNOSIS — R2689 Other abnormalities of gait and mobility: Secondary | ICD-10-CM | POA: Diagnosis not present

## 2021-10-17 DIAGNOSIS — Z471 Aftercare following joint replacement surgery: Secondary | ICD-10-CM | POA: Diagnosis not present

## 2021-10-17 DIAGNOSIS — I482 Chronic atrial fibrillation, unspecified: Secondary | ICD-10-CM | POA: Diagnosis not present

## 2021-10-17 DIAGNOSIS — M1990 Unspecified osteoarthritis, unspecified site: Secondary | ICD-10-CM | POA: Diagnosis not present

## 2021-10-17 DIAGNOSIS — M6281 Muscle weakness (generalized): Secondary | ICD-10-CM | POA: Diagnosis not present

## 2021-10-19 DIAGNOSIS — M1990 Unspecified osteoarthritis, unspecified site: Secondary | ICD-10-CM | POA: Diagnosis not present

## 2021-10-19 DIAGNOSIS — M6281 Muscle weakness (generalized): Secondary | ICD-10-CM | POA: Diagnosis not present

## 2021-10-19 DIAGNOSIS — Z471 Aftercare following joint replacement surgery: Secondary | ICD-10-CM | POA: Diagnosis not present

## 2021-10-19 DIAGNOSIS — I482 Chronic atrial fibrillation, unspecified: Secondary | ICD-10-CM | POA: Diagnosis not present

## 2021-10-19 DIAGNOSIS — R2689 Other abnormalities of gait and mobility: Secondary | ICD-10-CM | POA: Diagnosis not present

## 2021-10-22 DIAGNOSIS — Z471 Aftercare following joint replacement surgery: Secondary | ICD-10-CM | POA: Diagnosis not present

## 2021-10-22 DIAGNOSIS — M1990 Unspecified osteoarthritis, unspecified site: Secondary | ICD-10-CM | POA: Diagnosis not present

## 2021-10-22 DIAGNOSIS — M6281 Muscle weakness (generalized): Secondary | ICD-10-CM | POA: Diagnosis not present

## 2021-10-22 DIAGNOSIS — R2689 Other abnormalities of gait and mobility: Secondary | ICD-10-CM | POA: Diagnosis not present

## 2021-10-22 DIAGNOSIS — I482 Chronic atrial fibrillation, unspecified: Secondary | ICD-10-CM | POA: Diagnosis not present

## 2021-10-24 DIAGNOSIS — R2689 Other abnormalities of gait and mobility: Secondary | ICD-10-CM | POA: Diagnosis not present

## 2021-10-24 DIAGNOSIS — I482 Chronic atrial fibrillation, unspecified: Secondary | ICD-10-CM | POA: Diagnosis not present

## 2021-10-24 DIAGNOSIS — M1990 Unspecified osteoarthritis, unspecified site: Secondary | ICD-10-CM | POA: Diagnosis not present

## 2021-10-24 DIAGNOSIS — M6281 Muscle weakness (generalized): Secondary | ICD-10-CM | POA: Diagnosis not present

## 2021-10-24 DIAGNOSIS — Z471 Aftercare following joint replacement surgery: Secondary | ICD-10-CM | POA: Diagnosis not present

## 2021-10-26 DIAGNOSIS — Z471 Aftercare following joint replacement surgery: Secondary | ICD-10-CM | POA: Diagnosis not present

## 2021-10-26 DIAGNOSIS — M1990 Unspecified osteoarthritis, unspecified site: Secondary | ICD-10-CM | POA: Diagnosis not present

## 2021-10-26 DIAGNOSIS — R2689 Other abnormalities of gait and mobility: Secondary | ICD-10-CM | POA: Diagnosis not present

## 2021-10-26 DIAGNOSIS — M6281 Muscle weakness (generalized): Secondary | ICD-10-CM | POA: Diagnosis not present

## 2021-10-26 DIAGNOSIS — I482 Chronic atrial fibrillation, unspecified: Secondary | ICD-10-CM | POA: Diagnosis not present

## 2021-10-29 DIAGNOSIS — I482 Chronic atrial fibrillation, unspecified: Secondary | ICD-10-CM | POA: Diagnosis not present

## 2021-10-29 DIAGNOSIS — M1990 Unspecified osteoarthritis, unspecified site: Secondary | ICD-10-CM | POA: Diagnosis not present

## 2021-10-29 DIAGNOSIS — M6281 Muscle weakness (generalized): Secondary | ICD-10-CM | POA: Diagnosis not present

## 2021-10-29 DIAGNOSIS — R2689 Other abnormalities of gait and mobility: Secondary | ICD-10-CM | POA: Diagnosis not present

## 2021-10-29 DIAGNOSIS — Z471 Aftercare following joint replacement surgery: Secondary | ICD-10-CM | POA: Diagnosis not present

## 2021-10-30 ENCOUNTER — Encounter: Payer: Medicare Other | Admitting: Internal Medicine

## 2021-10-31 DIAGNOSIS — Z471 Aftercare following joint replacement surgery: Secondary | ICD-10-CM | POA: Diagnosis not present

## 2021-10-31 DIAGNOSIS — M6281 Muscle weakness (generalized): Secondary | ICD-10-CM | POA: Diagnosis not present

## 2021-10-31 DIAGNOSIS — M1990 Unspecified osteoarthritis, unspecified site: Secondary | ICD-10-CM | POA: Diagnosis not present

## 2021-10-31 DIAGNOSIS — I482 Chronic atrial fibrillation, unspecified: Secondary | ICD-10-CM | POA: Diagnosis not present

## 2021-10-31 DIAGNOSIS — R2689 Other abnormalities of gait and mobility: Secondary | ICD-10-CM | POA: Diagnosis not present

## 2021-11-02 DIAGNOSIS — M6281 Muscle weakness (generalized): Secondary | ICD-10-CM | POA: Diagnosis not present

## 2021-11-02 DIAGNOSIS — I482 Chronic atrial fibrillation, unspecified: Secondary | ICD-10-CM | POA: Diagnosis not present

## 2021-11-02 DIAGNOSIS — R2689 Other abnormalities of gait and mobility: Secondary | ICD-10-CM | POA: Diagnosis not present

## 2021-11-02 DIAGNOSIS — Z471 Aftercare following joint replacement surgery: Secondary | ICD-10-CM | POA: Diagnosis not present

## 2021-11-02 DIAGNOSIS — M1990 Unspecified osteoarthritis, unspecified site: Secondary | ICD-10-CM | POA: Diagnosis not present

## 2021-11-05 DIAGNOSIS — M6281 Muscle weakness (generalized): Secondary | ICD-10-CM | POA: Diagnosis not present

## 2021-11-05 DIAGNOSIS — Z471 Aftercare following joint replacement surgery: Secondary | ICD-10-CM | POA: Diagnosis not present

## 2021-11-05 DIAGNOSIS — R2689 Other abnormalities of gait and mobility: Secondary | ICD-10-CM | POA: Diagnosis not present

## 2021-11-05 DIAGNOSIS — M1990 Unspecified osteoarthritis, unspecified site: Secondary | ICD-10-CM | POA: Diagnosis not present

## 2021-11-05 DIAGNOSIS — I482 Chronic atrial fibrillation, unspecified: Secondary | ICD-10-CM | POA: Diagnosis not present

## 2021-11-06 ENCOUNTER — Ambulatory Visit (INDEPENDENT_AMBULATORY_CARE_PROVIDER_SITE_OTHER): Payer: Medicare Other | Admitting: Pulmonary Disease

## 2021-11-06 ENCOUNTER — Encounter: Payer: Self-pay | Admitting: Pulmonary Disease

## 2021-11-06 VITALS — BP 126/72 | HR 92 | Temp 98.1°F | Ht 67.0 in | Wt 205.4 lb

## 2021-11-06 DIAGNOSIS — R0602 Shortness of breath: Secondary | ICD-10-CM

## 2021-11-06 DIAGNOSIS — G4733 Obstructive sleep apnea (adult) (pediatric): Secondary | ICD-10-CM | POA: Diagnosis not present

## 2021-11-06 LAB — PULMONARY FUNCTION TEST
DL/VA % pred: 113 %
DL/VA: 4.48 ml/min/mmHg/L
DLCO cor % pred: 91 %
DLCO cor: 18.59 ml/min/mmHg
DLCO unc % pred: 86 %
DLCO unc: 17.67 ml/min/mmHg
FEF 25-75 Post: 1.7 L/sec
FEF 25-75 Pre: 1.33 L/sec
FEF2575-%Change-Post: 28 %
FEF2575-%Pred-Post: 128 %
FEF2575-%Pred-Pre: 100 %
FEV1-%Change-Post: 7 %
FEV1-%Pred-Post: 92 %
FEV1-%Pred-Pre: 86 %
FEV1-Post: 1.91 L
FEV1-Pre: 1.78 L
FEV1FVC-%Change-Post: 6 %
FEV1FVC-%Pred-Pre: 100 %
FEV6-%Change-Post: 2 %
FEV6-%Pred-Post: 94 %
FEV6-%Pred-Pre: 91 %
FEV6-Post: 2.45 L
FEV6-Pre: 2.4 L
FEV6FVC-%Pred-Post: 105 %
FEV6FVC-%Pred-Pre: 105 %
FVC-%Change-Post: 0 %
FVC-%Pred-Post: 88 %
FVC-%Pred-Pre: 87 %
FVC-Post: 2.45 L
FVC-Pre: 2.43 L
Post FEV1/FVC ratio: 78 %
Post FEV6/FVC ratio: 100 %
Pre FEV1/FVC ratio: 73 %
Pre FEV6/FVC Ratio: 100 %
RV % pred: 81 %
RV: 2.16 L
TLC % pred: 84 %
TLC: 4.67 L

## 2021-11-06 NOTE — Progress Notes (Signed)
PFT done today. 

## 2021-11-06 NOTE — Progress Notes (Signed)
? ?[facility] ? ?Chief Complaint  ?Patient presents with  ? Follow-up  ?  SOB unchanged, C Pap  overheats    ? ? ?Past Surgical History:  ?She  has a past surgical history that includes pacemaker placement (Right, 07/22/2010); Cataract extraction; Colonoscopy; Esophagogastroduodenoscopy; Shoulder arthroscopy w/ rotator cuff repair (Right, 08/30/2014); Laparoscopic cholecystectomy (08/11/1999); Lumbar disc surgery (02/19/2014); Back surgery; Vaginal hysterectomy (07/22/1968); Cataract extraction w/ intraocular lens  implant, bilateral (Bilateral); Shoulder arthroscopy with rotator cuff repair and subacromial decompression (Right, 08/30/2014); ORIF ankle fracture (Left, 04/13/2015); IR ANGIO VERTEBRAL SEL VERTEBRAL BILAT MOD SED (12/30/2017); IR ANGIO INTRA EXTRACRAN SEL COM CAROTID INNOMINATE BILAT MOD SED (12/30/2017); Wrist fracture surgery (Left); Total knee arthroplasty (Left, 02/13/2021); and Total hip arthroplasty (Right, 08/14/2021). ? ?Past Medical History:  ?A fib, HTN, Depression, Hypothyroidism, GERD, Hyponatremia, CVA, Sinus node dysfunction s/p PM, PSVT, Osteoporosis, Macular degeneration, Hypercalcemia, Colon polyps ? ?Constitutional:  ?BP 126/72 (BP Location: Right Arm, Patient Position: Sitting, Cuff Size: Normal)   Pulse 92   Temp 98.1 ?F (36.7 ?C) (Oral)   Ht '5\' 7"'$  (1.702 m)   Wt 205 lb 6.4 oz (93.2 kg)   LMP 07/22/1968   SpO2 97%   BMI 32.17 kg/m?  ? ?Brief Summary:  ?Brittany [name] is a 88 y.o. female with obstructive sleep apnea. ?  ? ? ? ?Subjective:  ? ?She is here with her husband. ? ?She was seen by Roxan Diesel recently.  Chest xray from 08/22/21 showed mild cardiomegaly.  She had shortness of breathing after having Lt knee surgery in July and then Rt hip surgery in January.  She had anemia after surgery.  She was also started on lasix for CHF exacerbation.  PFT today was normal.  Hb from 09/26/21 was 11.9. ? ?She still get short of breath when  walking, but recovers after resting for a minute. ? ?CPAP machine is getting overheated and burning through water in humidifier.  Not having air leak from mask. ? ?Physical Exam:  ? ?Appearance - well kempt  ? ?ENMT - no sinus tenderness, no oral exudate, no LAN, Mallampati 3 airway, no stridor ? ?Respiratory - equal breath sounds bilaterally, no wheezing or rales ? ?CV - s1s2 regular rate and rhythm, no murmurs ? ?Ext - no clubbing, no edema ? ?Skin - no rashes ? ?Psych - normal mood and affect ? ?  ?Pulmonary Tests:  ?PFT 11/06/21 >> FEV1 1.91 (92%), FEV1% 78, TLC 4.67 (84%), DLCO 86% ? ?Sleep Tests:  ?PSG 08/13/12 ([place]) >> AHI 24, SaO2 low 72%, CPAP 8 ?PSG 07/03/14 >> AHI 24.4, SaO2 low 76%, PLMI 81.8.  CPAP 8 cm H2O >> AHI 0, +S.   ?CPAP 04/24/21 to 05/23/21 >> used on 29 of 30 nights with average 9 hrs 16 min.  Average AHI 2.5 with CPAP 8 cm H2O ? ?Cardiac Tests:  ?Echo 08/10/21 >> EF 55 to 60%, grade 1 DD, mod elevation in PASP, mod RA/LA dilation, mild/mod MR, mild/mod TR, mild AR, ascending aorta 38 mm ? ?Social History:  ?She  reports that she quit smoking about 31 years ago. Her smoking use included cigarettes. She has a 45.00 pack-year smoking history. She has never used smokeless tobacco. She reports current alcohol use of about 28.0 standard drinks per week. She reports that she does not use drugs. ? ?Family History:  ?Her family history includes Asthma in her brother; Breast cancer in her paternal aunt; Cancer in her father; Colon cancer  in her mother; Heart disease in her brother; Skin cancer in her brother; Stroke in her sister. ?  ? ? ?Assessment/Plan:  ? ?Obstructive sleep apnea. ?- she is compliant with CPAP and reports benefit ?- she uses [employer] for her DME ?- will have her DME check her CPAP machine and determine if it can be repaired or needs to be replaced ? ?Dyspnea on exertion. ?- likely combination of CHF, anemia, and deconditioning ?- no obvious lung disease that would be contributing ?-  no additional pulmonary tests needed at this time ?- encouraged her to maintain a regular exercise regimen as tolerated  ?  ?Atrial fibrillation. ?- followed by Dr. Caryl Comes with Cardiology ? ?Time Spent Involved in Patient Care on Day of Examination:  ?35 minutes ? ?Follow up:  ? ?Patient Instructions  ?Will have [employer] check your CPAP machine ? ?Follow up in 1 year ? ?Medication List:  ? ?Allergies as of 11/06/2021   ? ?   Reactions  ? Citalopram Hydrobromide Other (See Comments)  ? Other reaction(s): QT prolongation with Tikosyn  ? Adhesive [tape] Swelling, Rash  ? Bacitracin-polymyxin B Other (See Comments)  ? Other reaction(s): Unknown  ? Benzalkonium Chloride Rash  ? Pt was not aware of this allergy  ? Neosporin [neomycin-polymyxin-gramicidin] Swelling, Rash  ? ?  ? ?  ?Medication List  ?  ? ?  ? Accurate as of November 06, 2021  2:26 PM. If you have any questions, ask your nurse or doctor.  ?  ?  ? ?  ? ?acetaminophen 500 MG tablet ?Commonly known as: TYLENOL ?Take 1,000 mg by mouth every 6 (six) hours as needed for moderate pain. ?  ?albuterol 108 (90 Base) MCG/ACT inhaler ?Commonly known as: VENTOLIN HFA ?Inhale 2 puffs into the lungs every 6 (six) hours as needed for wheezing or shortness of breath. ?  ?alendronate 70 MG tablet ?Commonly known as: FOSAMAX ?Take 1 tablet (70 mg total) by mouth every 7 (seven) days. Take with a full glass of water on an empty stomach. ?  ?apixaban 5 MG Tabs tablet ?Commonly known as: ELIQUIS ?Take 1 tablet (5 mg total) by mouth 2 (two) times daily. ?  ?B-complex with vitamin C tablet ?Take 1 tablet by mouth daily with lunch. ?  ?diltiazem 120 MG 24 hr capsule ?Commonly known as: CARDIZEM CD ?take 1 capsule by mouth every morning and at bedtime ?  ?dofetilide 500 MCG capsule ?Commonly known as: TIKOSYN ?TAKE ONE CAPSULE BY MOUTH TWICE DAILY ?  ?furosemide 40 MG tablet ?Commonly known as: LASIX ?Take 1 tablet (40 mg total) by mouth daily. ?  ?Lutein 20 MG Tabs ?Take 20 mg by mouth  daily with breakfast. ?  ?magnesium oxide 400 MG tablet ?Commonly known as: MAG-OX ?Take 1 tablet (400 mg total) by mouth daily. ?  ?Melatonin 10 MG Tabs ?Take 10-40 mg by mouth at bedtime as needed (sleep). ?  ?metoprolol succinate 50 MG 24 hr tablet ?Commonly known as: TOPROL-XL ?Take 2 tablets (100 mg total) by mouth 3 (three) times daily. Take with or immediately following a meal. ?  ?multivitamin with minerals Tabs tablet ?Take 1 tablet by mouth daily with breakfast. ?  ?potassium chloride 10 MEQ tablet ?Commonly known as: KLOR-CON ?Take 1 tablet (10 mEq total) by mouth daily. ?  ?PRESCRIPTION MEDICATION ?Inhale into the lungs at bedtime. CPAP ?  ?PRESERVISION AREDS 2 PO ?Take 1 capsule by mouth 2 (two) times daily with a meal. ?  ?sertraline 50  MG tablet ?Commonly known as: Zoloft ?Take 1 tablet (50 mg total) by mouth daily. ?  ?Vitamin D3 125 MCG (5000 UT) Caps ?Take 5,000 Units by mouth daily with breakfast. ?  ? ?  ? ? ?Signature:  ?Chesley Mires, MD ?[place] ?Pager - [phone] ?11/06/2021, 2:26 PM ?  ? ? ? ? ? ? ? ? ?

## 2021-11-06 NOTE — Patient Instructions (Signed)
Will have [employer] check your CPAP machine ? ?Follow up in 1 year ?

## 2021-11-07 DIAGNOSIS — E785 Hyperlipidemia, unspecified: Secondary | ICD-10-CM | POA: Diagnosis not present

## 2021-11-07 DIAGNOSIS — M1990 Unspecified osteoarthritis, unspecified site: Secondary | ICD-10-CM | POA: Diagnosis not present

## 2021-11-07 DIAGNOSIS — I10 Essential (primary) hypertension: Secondary | ICD-10-CM | POA: Diagnosis not present

## 2021-11-07 DIAGNOSIS — I4891 Unspecified atrial fibrillation: Secondary | ICD-10-CM | POA: Diagnosis not present

## 2021-11-07 DIAGNOSIS — M6281 Muscle weakness (generalized): Secondary | ICD-10-CM | POA: Diagnosis not present

## 2021-11-07 DIAGNOSIS — I482 Chronic atrial fibrillation, unspecified: Secondary | ICD-10-CM | POA: Diagnosis not present

## 2021-11-07 DIAGNOSIS — M810 Age-related osteoporosis without current pathological fracture: Secondary | ICD-10-CM | POA: Diagnosis not present

## 2021-11-07 DIAGNOSIS — Z471 Aftercare following joint replacement surgery: Secondary | ICD-10-CM | POA: Diagnosis not present

## 2021-11-07 DIAGNOSIS — R2689 Other abnormalities of gait and mobility: Secondary | ICD-10-CM | POA: Diagnosis not present

## 2021-11-09 DIAGNOSIS — M6281 Muscle weakness (generalized): Secondary | ICD-10-CM | POA: Diagnosis not present

## 2021-11-09 DIAGNOSIS — M25551 Pain in right hip: Secondary | ICD-10-CM | POA: Diagnosis not present

## 2021-11-09 DIAGNOSIS — Z471 Aftercare following joint replacement surgery: Secondary | ICD-10-CM | POA: Diagnosis not present

## 2021-11-09 DIAGNOSIS — M1990 Unspecified osteoarthritis, unspecified site: Secondary | ICD-10-CM | POA: Diagnosis not present

## 2021-11-09 DIAGNOSIS — Z9889 Other specified postprocedural states: Secondary | ICD-10-CM | POA: Diagnosis not present

## 2021-11-09 DIAGNOSIS — R2689 Other abnormalities of gait and mobility: Secondary | ICD-10-CM | POA: Diagnosis not present

## 2021-11-09 DIAGNOSIS — I482 Chronic atrial fibrillation, unspecified: Secondary | ICD-10-CM | POA: Diagnosis not present

## 2021-11-12 DIAGNOSIS — M1990 Unspecified osteoarthritis, unspecified site: Secondary | ICD-10-CM | POA: Diagnosis not present

## 2021-11-12 DIAGNOSIS — M6281 Muscle weakness (generalized): Secondary | ICD-10-CM | POA: Diagnosis not present

## 2021-11-12 DIAGNOSIS — R2689 Other abnormalities of gait and mobility: Secondary | ICD-10-CM | POA: Diagnosis not present

## 2021-11-12 DIAGNOSIS — Z471 Aftercare following joint replacement surgery: Secondary | ICD-10-CM | POA: Diagnosis not present

## 2021-11-12 DIAGNOSIS — I482 Chronic atrial fibrillation, unspecified: Secondary | ICD-10-CM | POA: Diagnosis not present

## 2021-11-21 DIAGNOSIS — M47816 Spondylosis without myelopathy or radiculopathy, lumbar region: Secondary | ICD-10-CM | POA: Diagnosis not present

## 2021-11-21 DIAGNOSIS — M47817 Spondylosis without myelopathy or radiculopathy, lumbosacral region: Secondary | ICD-10-CM | POA: Diagnosis not present

## 2021-11-25 ENCOUNTER — Other Ambulatory Visit: Payer: Self-pay | Admitting: Internal Medicine

## 2021-11-28 ENCOUNTER — Ambulatory Visit (INDEPENDENT_AMBULATORY_CARE_PROVIDER_SITE_OTHER): Payer: Medicare Other

## 2021-11-28 DIAGNOSIS — I495 Sick sinus syndrome: Secondary | ICD-10-CM

## 2021-11-29 LAB — CUP PACEART REMOTE DEVICE CHECK
Battery Impedance: 4365 Ohm
Battery Remaining Longevity: 11 mo
Battery Voltage: 2.69 V
Brady Statistic AP VP Percent: 2 %
Brady Statistic AP VS Percent: 6 %
Brady Statistic AS VP Percent: 4 %
Brady Statistic AS VS Percent: 87 %
Date Time Interrogation Session: 20230510163438
Implantable Lead Implant Date: 20121213
Implantable Lead Implant Date: 20121213
Implantable Lead Location: 753859
Implantable Lead Location: 753860
Implantable Lead Model: 4076
Implantable Lead Model: 4076
Implantable Lead Serial Number: 835025
Implantable Lead Serial Number: 869269
Implantable Pulse Generator Implant Date: 20121213
Lead Channel Impedance Value: 395 Ohm
Lead Channel Impedance Value: 530 Ohm
Lead Channel Pacing Threshold Amplitude: 0.625 V
Lead Channel Pacing Threshold Amplitude: 0.875 V
Lead Channel Pacing Threshold Pulse Width: 0.4 ms
Lead Channel Pacing Threshold Pulse Width: 0.4 ms
Lead Channel Setting Pacing Amplitude: 2 V
Lead Channel Setting Pacing Amplitude: 2.5 V
Lead Channel Setting Pacing Pulse Width: 0.4 ms
Lead Channel Setting Sensing Sensitivity: 4 mV

## 2021-12-05 DIAGNOSIS — D6869 Other thrombophilia: Secondary | ICD-10-CM | POA: Diagnosis not present

## 2021-12-05 DIAGNOSIS — I10 Essential (primary) hypertension: Secondary | ICD-10-CM | POA: Diagnosis not present

## 2021-12-05 DIAGNOSIS — F411 Generalized anxiety disorder: Secondary | ICD-10-CM | POA: Diagnosis not present

## 2021-12-05 DIAGNOSIS — G4733 Obstructive sleep apnea (adult) (pediatric): Secondary | ICD-10-CM | POA: Diagnosis not present

## 2021-12-05 DIAGNOSIS — Z8673 Personal history of transient ischemic attack (TIA), and cerebral infarction without residual deficits: Secondary | ICD-10-CM | POA: Diagnosis not present

## 2021-12-05 DIAGNOSIS — E785 Hyperlipidemia, unspecified: Secondary | ICD-10-CM | POA: Diagnosis not present

## 2021-12-05 DIAGNOSIS — I4891 Unspecified atrial fibrillation: Secondary | ICD-10-CM | POA: Diagnosis not present

## 2021-12-05 DIAGNOSIS — R7303 Prediabetes: Secondary | ICD-10-CM | POA: Diagnosis not present

## 2021-12-05 DIAGNOSIS — Z1331 Encounter for screening for depression: Secondary | ICD-10-CM | POA: Diagnosis not present

## 2021-12-05 DIAGNOSIS — Z0000 Encounter for general adult medical examination without abnormal findings: Secondary | ICD-10-CM | POA: Diagnosis not present

## 2021-12-05 DIAGNOSIS — M810 Age-related osteoporosis without current pathological fracture: Secondary | ICD-10-CM | POA: Diagnosis not present

## 2021-12-10 ENCOUNTER — Other Ambulatory Visit: Payer: Self-pay | Admitting: Family Medicine

## 2021-12-10 DIAGNOSIS — M810 Age-related osteoporosis without current pathological fracture: Secondary | ICD-10-CM

## 2021-12-10 NOTE — Progress Notes (Signed)
Remote pacemaker transmission.   

## 2021-12-12 ENCOUNTER — Telehealth: Payer: Self-pay | Admitting: Internal Medicine

## 2021-12-12 NOTE — Telephone Encounter (Signed)
Pt c/o medication issue:  1. Name of Medication: Furosemide  2. How are you currently taking this medication (dosage and times per day)? 1 time a day  3. Are you having a reaction (difficulty breathing--STAT)?   4. What is your medication issue? Does she need to continue taking it ?    Pt c/o medication issue:  1. Name of Medication: Metoprolol   2. How are you currently taking this medication (dosage and times per day)? 6 tablets a day  3. Are you having a reaction (difficulty breathing--STAT)?   4. What is your medication issue? Does she need to continue taking this dose

## 2021-12-12 NOTE — Telephone Encounter (Signed)
Spoke with pt who states she has had very little improvement with SOB and taking Furosemide.  HR runs in the  90's and BP is 140's/80's-90's.  Pt does not have her log to review actual pressures with the RN.  Pt was to f/u at 6 weeks and does not have an appointment scheduled.  Appt made for 12/21/2021 at 245pm with Tommye Standard for further evaluation of medications..  Requested pt bring her BP and HR log with her to appt.  Pt verbalizes understanding and agrees with current plan.

## 2021-12-19 DIAGNOSIS — M791 Myalgia, unspecified site: Secondary | ICD-10-CM | POA: Diagnosis not present

## 2021-12-19 DIAGNOSIS — M47816 Spondylosis without myelopathy or radiculopathy, lumbar region: Secondary | ICD-10-CM | POA: Diagnosis not present

## 2021-12-21 ENCOUNTER — Encounter: Payer: Self-pay | Admitting: Physician Assistant

## 2021-12-21 ENCOUNTER — Encounter: Payer: Self-pay | Admitting: *Deleted

## 2021-12-21 ENCOUNTER — Ambulatory Visit (INDEPENDENT_AMBULATORY_CARE_PROVIDER_SITE_OTHER): Payer: Medicare Other | Admitting: Physician Assistant

## 2021-12-21 VITALS — BP 160/82 | HR 88 | Ht 68.0 in | Wt 209.0 lb

## 2021-12-21 DIAGNOSIS — I4891 Unspecified atrial fibrillation: Secondary | ICD-10-CM | POA: Diagnosis not present

## 2021-12-21 DIAGNOSIS — I480 Paroxysmal atrial fibrillation: Secondary | ICD-10-CM

## 2021-12-21 DIAGNOSIS — R0602 Shortness of breath: Secondary | ICD-10-CM

## 2021-12-21 DIAGNOSIS — I10 Essential (primary) hypertension: Secondary | ICD-10-CM

## 2021-12-21 DIAGNOSIS — Z950 Presence of cardiac pacemaker: Secondary | ICD-10-CM

## 2021-12-21 LAB — CUP PACEART INCLINIC DEVICE CHECK
Battery Impedance: 4320 Ohm
Battery Remaining Longevity: 11 mo
Battery Voltage: 2.69 V
Brady Statistic AP VP Percent: 2 %
Brady Statistic AP VS Percent: 6 %
Brady Statistic AS VP Percent: 5 %
Brady Statistic AS VS Percent: 87 %
Date Time Interrogation Session: 20230602165631
Implantable Lead Implant Date: 20121213
Implantable Lead Implant Date: 20121213
Implantable Lead Location: 753859
Implantable Lead Location: 753860
Implantable Lead Model: 4076
Implantable Lead Model: 4076
Implantable Lead Serial Number: 835025
Implantable Lead Serial Number: 869269
Implantable Pulse Generator Implant Date: 20121213
Lead Channel Impedance Value: 387 Ohm
Lead Channel Impedance Value: 512 Ohm
Lead Channel Pacing Threshold Amplitude: 0.625 V
Lead Channel Pacing Threshold Amplitude: 0.75 V
Lead Channel Pacing Threshold Amplitude: 0.75 V
Lead Channel Pacing Threshold Amplitude: 1 V
Lead Channel Pacing Threshold Pulse Width: 0.4 ms
Lead Channel Pacing Threshold Pulse Width: 0.4 ms
Lead Channel Pacing Threshold Pulse Width: 0.4 ms
Lead Channel Pacing Threshold Pulse Width: 0.4 ms
Lead Channel Sensing Intrinsic Amplitude: 1.4 mV
Lead Channel Sensing Intrinsic Amplitude: 11.2 mV
Lead Channel Setting Pacing Amplitude: 2 V
Lead Channel Setting Pacing Amplitude: 2.5 V
Lead Channel Setting Pacing Pulse Width: 0.4 ms
Lead Channel Setting Sensing Sensitivity: 4 mV

## 2021-12-21 NOTE — Patient Instructions (Addendum)
Medication Instructions:    Your physician recommends that you continue on your current medications as directed. Please refer to the Current Medication list given to you today.   *If you need a refill on your cardiac medications before your next appointment, please call your pharmacy*   Lab Work:  BMET  AND BNP   If you have labs (blood work) drawn today and your tests are completely normal, you will receive your results only by: [place] (if you have MyChart) OR A paper copy in the mail If you have any lab test that is abnormal or we need to change your treatment, we will call you to review the results.   Testing/Procedures:  Your physician has requested that you have a lexiscan myoview. For further information please visit [email or web address]. Please follow instruction sheet, as given.    Follow-Up: At [facility], you and your health needs are our priority.  As part of our continuing mission to provide you with exceptional heart care, we have created designated Provider Care Teams.  These Care Teams include your primary Cardiologist (physician) and Advanced Practice Providers (APPs -  Physician Assistants and Nurse Practitioners) who all work together to provide you with the care you need, when you need it.  We recommend signing up for the patient portal called "MyChart".  Sign up information is provided on this After Visit Summary.  MyChart is used to connect with patients for Virtual Visits (Telemedicine).  Patients are able to view lab/test results, encounter notes, upcoming appointments, etc.  Non-urgent messages can be sent to your provider as well.   To learn more about what you can do with MyChart, go to [email or web address].    Your next appointment:    3 month(s) ( CONTACT [facility] [place] )   The format for your next appointment:   In Person  Provider:   Virl Axe, MD{   Other Instructions:   YOU HAVE BEEN RECOMMENDED TO WALK (  EXERCISE) AS ABLE TO DO  Important Information About Sugar

## 2021-12-21 NOTE — Progress Notes (Signed)
Cardiology Office Note Date:  12/21/2021  Patient ID:  Brittany [name], Brittany [name] Jun 20, 1934, MRN [id number] PCP:  Donald Prose, MD  Electrophysiologist: Dr. Caryl Comes    Chief Complaint:  ongoing SOB  History of Present Illness: Brittany [name] is a 88 y.o. female with history of stroke, SSSx w/PPM, Afib, ATach, HTN, HLD, hypothyroidism, OSA w/CPAP,    07/30/21 Hgb 13.4 > HIP SURGERY  on 1/24 > 1/25 8.1, 9.0 > 08/22/21 8.8  She comes in today to be seen for Dr. Caryl Comes, last seen by him 08/23/21, she had recently seen pulmonary with symptoms /signs of volume OL and started on furosemide. C/o palpitations, DOE as well as orthopedic pains He was unconvinced of HFpEF, though recommended to give the lasix a try for a week.  Also noted persistent HR 80's, suspect AT and her metoprolol increased to '150mg'$  BID, her ACE stopped Tikosyn continued  Saw pulm team 08/29/21, SOB better but not resoled, edema was resolved.  She was off lasix (at Dr. Olin Pia recommendation), suspected volume did play a roll though as well as deconditioning after orthopedic surgeries. Trial of prn albuterol   Pt called/my chart messages with concerns of lab results and ongoing SOB, started on torsemide '20mg'$  daily for 5 days and repeat BNP   BNP 3187 > 2448  I saw her 09/26/21 She is accompanied by her husband today. She reports that last year after her knee surgery she noticed a little SOB, but not much and tolerable, after her hip surgery though she has been markedly SOB with minimal exertion. The albuterol did not seem to help The diuretics perhaps though minimally. No CP, no palpitations or cardiac awareness No near syncope or syncope. She has had some longer episodes of Afib of late, she was unaware of them. Her DOE has been unchanged and persistent sione her hip surgery in January, she has not noticed specific days that she is SOB over others.  Suspected SOB multifactorial with a clear CXR, though elevated BNP. Increased AF  burden from her baseline felt triggered by recent surgery and anemia. Planned to check labs, if Hgb was better resume diuretic  Labs were OK, H/H better, BNP elevated and started on furosemide daily with plans for repeat labs  She saw Dr. Halford Chessman 11/06/21, not felt to have any obvious lung disease, and SOB likely CHF, anemia, deconditioning, recommended regular exercise  TODAY She is accompanied by her husband She thinks she may be starting to feel a little less SOB when ambulating. Whe she is walking with her walker she can walk without SOB or difficulty, and can walk as far as she wants/needs to, but when walking under her own power she gets winded. No rest SOB No CP She denies symptoms of palpitations, but thinks she has had AFib, because when at her MD visit last week they told her she was out of rhythm, she could not tell. No near syncope or syncope. No bleeding.  PMD did labs, 12/05/21 her PTH/calcium was elevated and is being referred to a endocrinologist, she recalls this being mentioned years ago by her MD in [place], there was some discussion about surgery but she did not want that. H/H 13/39 BUN/Creat 15/0.67 K+ 4.7   Device information MDT dual chamber PPM implanted 07/04/11  AFib Hx Diagnosis goes as far back as 2-14 or earlier  AAD hx Sotalol started perhaps 2014 or earlier stopped with recurrent Afib Tikosyn started Sep 2020  Past Medical History:  Diagnosis  Date   Ankle fracture 2016   Arthritis    Atrial fibrillation (HCC)    Depression    Dysrhythmia    Elevated parathyroid hormone    Fracture of orbital floor with routine healing    GERD (gastroesophageal reflux disease)    History of colon polyps    benign   Hypercalcemia    Hypertension    Hyponatremia    Hypothyroid    Macular degeneration    wet in the right and dry in the left   OSA on CPAP    Osteoporosis    Peripheral vascular disease (HCC)    Presence of permanent cardiac pacemaker     PSVT (paroxysmal supraventricular tachycardia) (HCC)    Rotator cuff tear    Sinus node dysfunction (HCC)    a. s/p MDT pacemaker   Stroke [facility])    Urinary incontinence    Wrist fracture 2018    Past Surgical History:  Procedure Laterality Date   BACK SURGERY     CATARACT EXTRACTION     CATARACT EXTRACTION W/ INTRAOCULAR LENS  IMPLANT, BILATERAL Bilateral    COLONOSCOPY     ESOPHAGOGASTRODUODENOSCOPY     IR ANGIO INTRA EXTRACRAN SEL COM CAROTID INNOMINATE BILAT MOD SED  12/30/2017   IR ANGIO VERTEBRAL SEL VERTEBRAL BILAT MOD SED  12/30/2017   LAPAROSCOPIC CHOLECYSTECTOMY  08/11/1999   LUMBAR [place] SURGERY  02/19/2014   ORIF ANKLE FRACTURE Left 04/13/2015   Procedure: OPEN REDUCTION INTERNAL FIXATION (ORIF) LEFT ANKLE FRACTURE;  Surgeon: Meredith Pel, MD;  Location: [place];  Service: Orthopedics;  Laterality: Left;   PACEMAKER PLACEMENT Right 07/22/2010   a. MDT dual chamber PPM implanted by Dr Caryl Comes    SHOULDER ARTHROSCOPY W/ ROTATOR CUFF REPAIR Right 08/30/2014   WITH MINI-OPEN ROTATOR CUFF REPAIR AND SUBACROMIAL DECOMPRESSION   SHOULDER ARTHROSCOPY WITH ROTATOR CUFF REPAIR AND SUBACROMIAL DECOMPRESSION Right 08/30/2014   Procedure: SHOULDER ARTHROSCOPY WITH MINI-OPEN ROTATOR CUFF REPAIR AND SUBACROMIAL DECOMPRESSION;  Surgeon: Meredith Pel, MD;  Location: [place];  Service: Orthopedics;  Laterality: Right;  RIGHT SHOULDER DIAGNOSTIC OPERATIVE ARTHROSCOPY, SUBACROMIAL DECOMPRESSION, MINI-OPEN ROTATOR CUFF REPAIR.   TOTAL HIP ARTHROPLASTY Right 08/14/2021   Procedure: TOTAL HIP ARTHROPLASTY ANTERIOR APPROACH;  Surgeon: Melrose Nakayama, MD;  Location: [facility];  Service: Orthopedics;  Laterality: Right;   TOTAL KNEE ARTHROPLASTY Left 02/13/2021   Procedure: LEFT TOTAL KNEE ARTHROPLASTY;  Surgeon: Melrose Nakayama, MD;  Location: [facility];  Service: Orthopedics;  Laterality: Left;   VAGINAL HYSTERECTOMY  07/22/1968   WRIST FRACTURE SURGERY Left     Current Outpatient Medications   Medication Sig Dispense Refill   acetaminophen (TYLENOL) 500 MG tablet Take 1,000 mg by mouth every 6 (six) hours as needed for moderate pain.     albuterol (VENTOLIN HFA) 108 (90 Base) MCG/ACT inhaler Inhale 2 puffs into the lungs every 6 (six) hours as needed for wheezing or shortness of breath. 8 g 2   alendronate (FOSAMAX) 70 MG tablet Take 1 tablet (70 mg total) by mouth every 7 (seven) days. Take with a full glass of water on an empty stomach. 4 tablet 11   apixaban (ELIQUIS) 5 MG TABS tablet Take 1 tablet (5 mg total) by mouth 2 (two) times daily. 180 tablet 3   B Complex-C (B-COMPLEX WITH VITAMIN C) tablet Take 1 tablet by mouth daily with lunch.      CARTIA XT 120 MG 24 hr capsule TAKE ONE CAPSULE BY MOUTH IN THE MORNING AND ONE  CAPSULE AT BEDTIME 180 capsule 0   Cholecalciferol (VITAMIN D3) 5000 UNITS CAPS Take 5,000 Units by mouth daily with breakfast.      dofetilide (TIKOSYN) 500 MCG capsule TAKE ONE CAPSULE BY MOUTH TWICE DAILY 180 capsule 3   furosemide (LASIX) 40 MG tablet Take 1 tablet (40 mg total) by mouth daily. 90 tablet 3   Lutein 20 MG TABS Take 20 mg by mouth daily with breakfast.     magnesium oxide (MAG-OX) 400 MG tablet Take 1 tablet (400 mg total) by mouth daily. 30 tablet 0   Melatonin 10 MG TABS Take 10-40 mg by mouth at bedtime as needed (sleep).     metoprolol succinate (TOPROL-XL) 50 MG 24 hr tablet Take 2 tablets (100 mg total) by mouth 3 (three) times daily. Take with or immediately following a meal. 540 tablet 3   Multiple Vitamin (MULTIVITAMIN WITH MINERALS) TABS tablet Take 1 tablet by mouth daily with breakfast.      Multiple Vitamins-Minerals (PRESERVISION AREDS 2 PO) Take 1 capsule by mouth 2 (two) times daily with a meal.      potassium chloride (KLOR-CON) 10 MEQ tablet Take 1 tablet (10 mEq total) by mouth daily. 90 tablet 3   PRESCRIPTION MEDICATION Inhale into the lungs at bedtime. CPAP     sertraline (ZOLOFT) 50 MG tablet Take 1 tablet (50 mg total)  by mouth daily. 30 tablet 2   No current facility-administered medications for this visit.    Allergies:   Citalopram hydrobromide, Adhesive [tape], Bacitracin-polymyxin b, Benzalkonium chloride, and Neosporin [neomycin-polymyxin-gramicidin]   Social History:  The patient  reports that she quit smoking about 31 years ago. Her smoking use included cigarettes. She has a 45.00 pack-year smoking history. She has never used smokeless tobacco. She reports current alcohol use of about 28.0 standard drinks per week. She reports that she does not use drugs.   Family History:  The patient's family history includes Asthma in her brother; Breast cancer in her paternal aunt; Cancer in her father; Colon cancer in her mother; Heart disease in her brother; Skin cancer in her brother; Stroke in her sister.  ROS:  Please see the history of present illness.    All other systems are reviewed and otherwise negative.   PHYSICAL EXAM:  VS:  BP (!) 160/82   Pulse 88   Ht '5\' 8"'$  (1.727 m)   Wt 209 lb (94.8 kg)   LMP 07/22/1968   SpO2 97%   BMI 31.78 kg/m  BMI: Body mass index is 31.78 kg/m. Well nourished, well developed, in no acute distress HEENT: normocephalic, atraumatic Neck: no JVD, carotid bruits or masses Cardiac:  RRR; no significant murmurs, no rubs, or gallops Lungs:  CTA b/l, no wheezing, rhonchi or rales Abd: soft, nontender MS: no deformity or atrophy Ext: no edema Skin: warm and dry, no rash Neuro:  No gross deficits appreciated Psych: euthymic mood, full affect  PPM site is stable, no tethering or discomfort   EKG:  done today and reviewed by myself SR, manually measured QT 471m, QTc 486m Device interrogation done today and reviewed by myself:  Battery (estimate is 11 months), and lead measurements are good + AMS, , one lasing 60 hours, burden is 25.6% Rate controlled No HVR episodes   08/10/21: TTE 1. Left ventricular ejection fraction, by estimation, is 55 to 60%. The   left ventricle has normal function. The left ventricle has no regional  wall motion abnormalities. There is mild asymmetric  left ventricular  hypertrophy of the basal and septal  segments. Left ventricular diastolic parameters are consistent with Grade  I diastolic dysfunction (impaired relaxation). The average left  ventricular global longitudinal strain is -20.1 %. The global longitudinal  strain is normal.   2. Pacing wires in RA/RV . Right ventricular systolic function is normal.  The right ventricular size is normal. There is moderately elevated  pulmonary artery systolic pressure.   3. Left atrial size was moderately dilated.   4. Right atrial size was moderately dilated.   5. The mitral valve is degenerative. Mild to moderate mitral valve  regurgitation. No evidence of mitral stenosis.   6. Tricuspid valve regurgitation is mild to moderate.   7. The aortic valve is tricuspid. There is moderate calcification of the  aortic valve. There is moderate thickening of the aortic valve. Aortic  valve regurgitation is mild. Aortic valve sclerosis/calcification is  present, without any evidence of aortic  stenosis.   8. Aortic dilatation noted. There is mild dilatation of the ascending  aorta, measuring 38 mm.   9. The inferior vena cava is normal in size with greater than 50%  respiratory variability, suggesting right atrial pressure of 3 mmHg.   Comparison(s): 12/31/17 EF 55-60%.    Recent Labs: 07/30/2021: TSH 3.120 09/26/2021: Hemoglobin 11.9; Magnesium 1.9; NT-Pro BNP 2,462; Platelets 282 10/05/2021: BUN 13; Creatinine, Ser 0.68; Potassium 4.1; Sodium 140  No results found for requested labs within last 8760 hours.   CrCl cannot be calculated (Patient's most recent lab result is older than the maximum 21 days allowed.).   Wt Readings from Last 3 Encounters:  12/21/21 209 lb (94.8 kg)  11/06/21 205 lb 6.4 oz (93.2 kg)  09/26/21 215 lb 9.6 oz (97.8 kg)     Other studies  reviewed: Additional studies/records reviewed today include: summarized above  ASSESSMENT AND PLAN:  PPM Intact function No programming changes made  Paroxysmal AFib ATach CHA2DS2Vasc is 6, on Eliquis,  appropriately dosed Tikosyn QTc is OK Increased burden from the prior 6 mo.   HTN Await her labs for med adjustments Perhaps increase her dilt to 360 if we don't increase her lasix Home numbers much like today   SOB Likely multifactorial Unclear if her increased AF burden is the cause of her SOB, though don't think so. She can walk with SOB using her walker, but gets SOB when walking un-aided No pulmonary process by her w/u No CP, ? Angina Exam does not appear volume OL, will repeat  BNP and if remains elevated can push her diuretic I have asked her to ambulate with her walker to try and increase her conditioning. Will get a lexi stress test, she would want intervention if she had CAD    Disposition: will have her back in a few months, sooner pending labs/stress test findings    Current medicines are reviewed at length with the patient today.  The patient did not have any concerns regarding medicines.  Venetia Night, PA-C 12/21/2021 4:50 PM     [place] [id number] [phone] (office)  [phone] (fax)

## 2021-12-22 LAB — BASIC METABOLIC PANEL
BUN/Creatinine Ratio: 20 (ref 12–28)
BUN: 14 mg/dL (ref 8–27)
CO2: 25 mmol/L (ref 20–29)
Calcium: 10.5 mg/dL — ABNORMAL HIGH (ref 8.7–10.3)
Chloride: 96 mmol/L (ref 96–106)
Creatinine, Ser: 0.71 mg/dL (ref 0.57–1.00)
Glucose: 95 mg/dL (ref 70–99)
Potassium: 4.1 mmol/L (ref 3.5–5.2)
Sodium: 139 mmol/L (ref 134–144)
eGFR: 83 mL/min/{1.73_m2} (ref >59)

## 2021-12-22 LAB — PRO B NATRIURETIC PEPTIDE: NT-Pro BNP: 1081 pg/mL — ABNORMAL HIGH (ref 0–738)

## 2022-01-02 ENCOUNTER — Telehealth (HOSPITAL_COMMUNITY): Payer: Self-pay

## 2022-01-02 NOTE — Telephone Encounter (Signed)
Called to schedule us carotid, no answer, left vm. AW  

## 2022-01-07 ENCOUNTER — Telehealth (HOSPITAL_COMMUNITY): Payer: Self-pay | Admitting: *Deleted

## 2022-01-07 NOTE — Telephone Encounter (Signed)
Patient given detailed instructions per Myocardial Perfusion Study Information Sheet for the test on 01/09/22 Patient notified to arrive 15 minutes early and that it is imperative to arrive on time for appointment to keep from having the test rescheduled.  If you need to cancel or reschedule your appointment, please call the office within 24 hours of your appointment. . Patient verbalized understanding. Kirstie Peri

## 2022-01-09 ENCOUNTER — Ambulatory Visit (HOSPITAL_COMMUNITY): Payer: Medicare Other | Attending: Cardiology

## 2022-01-09 DIAGNOSIS — R0602 Shortness of breath: Secondary | ICD-10-CM | POA: Insufficient documentation

## 2022-01-09 DIAGNOSIS — M722 Plantar fascial fibromatosis: Secondary | ICD-10-CM | POA: Diagnosis not present

## 2022-01-09 LAB — MYOCARDIAL PERFUSION IMAGING
LV dias vol: 115 mL (ref 46–106)
LV sys vol: 36 mL
Nuc Stress EF: 69 %
Peak HR: 91 {beats}/min
Rest HR: 88 {beats}/min
Rest Nuclear Isotope Dose: 10.2 mCi
SDS: 3
SRS: 2
SSS: 5
ST Depression (mm): 0 mm
Stress Nuclear Isotope Dose: 32.1 mCi
TID: 1.03

## 2022-01-09 MED ORDER — TECHNETIUM TC 99M TETROFOSMIN IV KIT
32.1000 | PACK | Freq: Once | INTRAVENOUS | Status: AC | PRN
  Administered 2022-01-09: 32.1 via INTRAVENOUS

## 2022-01-09 MED ORDER — REGADENOSON 0.4 MG/5ML IV SOLN
0.4000 mg | Freq: Once | INTRAVENOUS | Status: AC
  Administered 2022-01-09: 0.4 mg via INTRAVENOUS

## 2022-01-09 MED ORDER — TECHNETIUM TC 99M TETROFOSMIN IV KIT
10.2000 | PACK | Freq: Once | INTRAVENOUS | Status: AC | PRN
  Administered 2022-01-09: 10.2 via INTRAVENOUS

## 2022-01-10 ENCOUNTER — Telehealth: Payer: Self-pay | Admitting: Internal Medicine

## 2022-01-10 NOTE — Telephone Encounter (Signed)
Left message for patient advising her that her stress test results had not been reviewed yet. Advised that we would call her back once they had been reviewed by the provider.

## 2022-01-10 NOTE — Telephone Encounter (Signed)
Patient was calling to get clarification on results from her stress test

## 2022-01-18 ENCOUNTER — Telehealth: Payer: Self-pay | Admitting: Internal Medicine

## 2022-01-18 DIAGNOSIS — I10 Essential (primary) hypertension: Secondary | ICD-10-CM

## 2022-01-18 NOTE — Telephone Encounter (Signed)
Called [place] from [facility]. She faxed over a BP monitor report. She would like Dr. Caryl Comes to review and be aware of the report. She stated patient's SBP is still elevated in the 150's. Will let Dr. Caryl Comes and Tommye Standard PA know.

## 2022-01-18 NOTE — Telephone Encounter (Signed)
Called to state that pt's BP still continues to be elevated.  They put pt on a monitor and faxed the results over today . Please advise

## 2022-01-21 NOTE — Telephone Encounter (Signed)
Pam  can we have her resume her lisinopril at 10 mg daily ( I think her last dose was 40)  Thanks SK  and we will need BMET about 2 weeks plz

## 2022-01-23 ENCOUNTER — Encounter (HOSPITAL_COMMUNITY): Payer: Medicare Other

## 2022-01-23 MED ORDER — LISINOPRIL 10 MG PO TABS: 10.0000 mg | ORAL_TABLET | Freq: Every day | ORAL | 3 refills | Status: DC

## 2022-01-23 NOTE — Telephone Encounter (Signed)
Spoke with patient and instructed to start Lisinopril '10mg'$  daily. Check Bmet in 2 weeks. Patient agreed to this plan

## 2022-01-28 ENCOUNTER — Other Ambulatory Visit (HOSPITAL_COMMUNITY): Payer: Self-pay | Admitting: Interventional Radiology

## 2022-01-28 ENCOUNTER — Telehealth: Payer: Self-pay

## 2022-01-28 DIAGNOSIS — I771 Stricture of artery: Secondary | ICD-10-CM

## 2022-01-28 NOTE — Telephone Encounter (Signed)
Pt is scheduled with Tommye Standard, PA on 02/13/2022.

## 2022-01-28 NOTE — Telephone Encounter (Signed)
-----   Message from [place], [place] sent at 01/27/2022 11:21 AM EDT ----- Have her come in and see me or Jonni Sanger to follow up on symptoms/ SOB and stress test. Next couple weeks is good

## 2022-01-31 ENCOUNTER — Other Ambulatory Visit: Payer: Self-pay

## 2022-01-31 MED ORDER — ISOSORBIDE MONONITRATE ER 30 MG PO TB24: 30.0000 mg | ORAL_TABLET | Freq: Every day | ORAL | 3 refills | Status: DC

## 2022-02-06 ENCOUNTER — Telehealth: Payer: Self-pay | Admitting: Internal Medicine

## 2022-02-06 ENCOUNTER — Other Ambulatory Visit: Payer: Medicare Other

## 2022-02-06 DIAGNOSIS — I10 Essential (primary) hypertension: Secondary | ICD-10-CM | POA: Diagnosis not present

## 2022-02-06 NOTE — Telephone Encounter (Signed)
Pt c/o medication issue:  1. Name of Medication: isosorbide mononitrate (IMDUR) 30 MG 24 hr tablet  2. How are you currently taking this medication (dosage and times per day)? Take 1 tablet (30 mg total) by mouth daily.  3. Are you having a reaction (difficulty breathing--STAT)? Yes   4. What is your medication issue? Medication gives patient bad diarrhea. Patient says that she is not going to take anymore. Wants someone to call for alternative medication.

## 2022-02-06 NOTE — Telephone Encounter (Signed)
Called pt in regards to diarrhea.  Reports started day after starting medication; started on 01/31/22. Has had diarrhea consistently since starting today after having labs drawn had an incontinent  episode of BM.  Pt reports will no longer take medication.   Reports only other new medication started is lisinopril 20 mg which was started a week prior to Imdur.  Denies having upset stomach or recent illness.  Pt reports SOB is continuing. Will route to [place], [place] to address.  Baldwin Jamaica, PA-C  01/31/2022  8:23 AM EDT     I have had opportunity to review her stress test further with one of or cardiologists.  Agrees, that the test is a low risk result.  Recommends if symptoms are improved, or stable would start Imdur '30mg'$  daily and monitor further. Please see how she is doing, how her BP is with the resumption of lisinopril.   If things are stable, and BP is >110 Start Imdur '30mg'$  daily and keep her appt with me in a few weeks as scheduled.

## 2022-02-07 ENCOUNTER — Ambulatory Visit (HOSPITAL_COMMUNITY): Payer: Medicare Other

## 2022-02-07 LAB — BASIC METABOLIC PANEL
BUN/Creatinine Ratio: 19 (ref 12–28)
BUN: 13 mg/dL (ref 8–27)
CO2: 25 mmol/L (ref 20–29)
Calcium: 10.5 mg/dL — ABNORMAL HIGH (ref 8.7–10.3)
Chloride: 97 mmol/L (ref 96–106)
Creatinine, Ser: 0.68 mg/dL (ref 0.57–1.00)
Glucose: 140 mg/dL — ABNORMAL HIGH (ref 70–99)
Potassium: 4.4 mmol/L (ref 3.5–5.2)
Sodium: 137 mmol/L (ref 134–144)
eGFR: 85 mL/min/{1.73_m2} (ref >59)

## 2022-02-07 NOTE — Telephone Encounter (Signed)
Called pt advised pt of Provider recommendation. "Stay off the Imdur (if I understand correctly, the Imdur is what gave her diarrhea)  Keep her appt next week   Renee " Pt reports had a large incontinent episode of BM last night.  Advised to make sure to stay hydrated and replace electrolytes.  Pt will be sure to come to next OV with Renee on 02/13/22.  No further questions or concerns voiced.

## 2022-02-12 ENCOUNTER — Telehealth: Payer: Self-pay | Admitting: Physician Assistant

## 2022-02-12 NOTE — Progress Notes (Unsigned)
Cardiology Office Note Date:  02/12/2022  Patient ID:  Brittany, [name] [birth year], MRN [id number] PCP:  Donald Prose, MD  Electrophysiologist: Dr. Caryl Comes    Chief Complaint:  planned f/u  History of Present Illness: [name] is a 88 y.o. female with history of stroke, SSSx w/PPM, Afib, ATach, HTN, HLD, hypothyroidism, OSA w/CPAP  07/30/21 Hgb 13.4 > HIP SURGERY  on 1/24 > 1/25 8.1, 9.0 > 08/22/21 8.8   She comes in today to be seen for Dr. Caryl Comes, last seen by him 08/23/21, she had recently seen pulmonary with symptoms /signs of volume OL and started on furosemide. C/o palpitations, DOE as well as orthopedic pains He was unconvinced of HFpEF, though recommended to give the lasix a try for a week.  Also noted persistent HR 80's, suspect AT and her metoprolol increased to '150mg'$  BID, her ACE stopped Tikosyn continued  Saw pulm team 08/29/21, SOB better but not resoled, edema was resolved.  She was off lasix (at Dr. Olin Pia recommendation), suspected volume did play a roll though as well as deconditioning after orthopedic surgeries. Trial of prn albuterol   Pt called/my chart messages with concerns of lab results and ongoing SOB, started on torsemide '20mg'$  daily for 5 days and repeat BNP   BNP 3187 > 2448  I saw her 09/26/21 She is accompanied by her husband today. She reports that last year after her knee surgery she noticed a little SOB, but not much and tolerable, after her hip surgery though she has been markedly SOB with minimal exertion. The albuterol did not seem to help The diuretics perhaps though minimally. No CP, no palpitations or cardiac awareness No near syncope or syncope. She has had some longer episodes of Afib of late, she was unaware of them. Her DOE has been unchanged and persistent sione her hip surgery in January, she has not noticed specific days that she is SOB over others.  Suspected SOB multifactorial with a clear CXR, though elevated BNP. Increased AF  burden from her baseline felt triggered by recent surgery and anemia. Planned to check labs, if Hgb was better resume diuretic  Labs were OK, H/H better, BNP elevated and started on furosemide daily with plans for repeat labs  She saw Dr. Halford Chessman 11/06/21, not felt to have any obvious lung disease, and SOB likely CHF, anemia, deconditioning, recommended regular exercise  I saw her 12/21/21 She is accompanied by her husband She thinks she may be starting to feel a little less SOB when ambulating. Whe she is walking with her walker she can walk without SOB or difficulty, and can walk as far as she wants/needs to, but when walking under her own power she gets winded. No rest SOB No CP She denies symptoms of palpitations, but thinks she has had AFib, because when at her MD visit last week they told her she was out of rhythm, she could not tell. No near syncope or syncope. No bleeding. PMD did labs, 12/05/21 her PTH/calcium was elevated and is being referred to a endocrinologist, she recalls this being mentioned years ago by her MD in [place], there was some discussion about surgery but she did not want that. H/H 13/39 BUN/Creat 15/0.67 K+ 4.7  Unclear etiology of her SOB, is more persistent the her AFib is, planned for a stress test evaluate further. Stress test was abnormal with a small area of ischemia apical to mid inferior wall, and felt a low risk study given small territory.  I reviewed with general cardiologist, agreed, given small territory would not pursue cath but try nitrate 1st/manage medically.  Not certain that this defect would cause SOB she describes.  Pt subsequently called with new onset diarrhea with the start of Imdur and advised to stop it and hydrate, follow.   I called 02/12/22 and spoke to the pt, seems the diarrhea is once but every day and only at [name], continues, is thin/watery.  No blood.   She had not reached out to PMD and was advised to.  Was planning on trying  immodium, she has tried pepto without relief. She was advised to reach out to her PMD   TODAY She took the imodium last [name] and did not have loose stool, feels like her belly is better today. Discussed low likely hood of the Imdur being the etiology, but not impossible. She otherwise remains the same. No erst SOB No orthopnea/PND No SOB when walking with the walker Gets winded with walking under hew own power No near syncope or syncope. She is not convinced the furosemide helps  No bleeding   Device information MDT dual chamber PPM implanted 07/04/11  AFib Hx Diagnosis goes as far back as 2-14 or earlier  AAD hx Sotalol started perhaps 2014 or earlier stopped with recurrent Afib Tikosyn started Sep 2020  Past Medical History:  Diagnosis Date   Ankle fracture 2016   Arthritis    Atrial fibrillation ([place])    Depression    Dysrhythmia    Elevated parathyroid hormone    Fracture of orbital floor with routine healing    GERD (gastroesophageal reflux disease)    History of colon polyps    benign   Hypercalcemia    Hypertension    Hyponatremia    Hypothyroid    Macular degeneration    wet in the right and dry in the left   OSA on CPAP    Osteoporosis    Peripheral vascular disease (HCC)    Presence of permanent cardiac pacemaker    PSVT (paroxysmal supraventricular tachycardia) (HCC)    Rotator cuff tear    Sinus node dysfunction (H[facility])    a. s/p MDT pacemaker   Stroke [facility])    Urinary incontinence    Wrist fracture 2018    Past Surgical History:  Procedure Laterality Date   BACK SURGERY     CATARACT EXTRACTION     CATARACT EXTRACTION W/ INTRAOCULAR LENS  IMPLANT, BILATERAL Bilateral    COLONOSCOPY     ESOPHAGOGASTRODUODENOSCOPY     IR ANGIO INTRA EXTRACRAN SEL COM CAROTID INNOMINATE BILAT MOD SED  12/30/2017   IR ANGIO VERTEBRAL SEL VERTEBRAL BILAT MOD SED  12/30/2017   LAPAROSCOPIC CHOLECYSTECTOMY  08/11/1999   LUMBAR [place] SURGERY  02/19/2014   ORIF  ANKLE FRACTURE Left 04/13/2015   Procedure: OPEN REDUCTION INTERNAL FIXATION (ORIF) LEFT ANKLE FRACTURE;  Surgeon: Meredith Pel, MD;  Location: [facility];  Service: Orthopedics;  Laterality: Left;   PACEMAKER PLACEMENT Right 07/22/2010   a. MDT dual chamber PPM implanted by Dr Caryl Comes    SHOULDER ARTHROSCOPY W/ ROTATOR CUFF REPAIR Right 08/30/2014   WITH MINI-OPEN ROTATOR CUFF REPAIR AND SUBACROMIAL DECOMPRESSION   SHOULDER ARTHROSCOPY WITH ROTATOR CUFF REPAIR AND SUBACROMIAL DECOMPRESSION Right 08/30/2014   Procedure: SHOULDER ARTHROSCOPY WITH MINI-OPEN ROTATOR CUFF REPAIR AND SUBACROMIAL DECOMPRESSION;  Surgeon: Meredith Pel, MD;  Location: [facility];  Service: Orthopedics;  Laterality: Right;  RIGHT SHOULDER DIAGNOSTIC OPERATIVE ARTHROSCOPY, SUBACROMIAL DECOMPRESSION, MINI-OPEN ROTATOR CUFF REPAIR.  TOTAL HIP ARTHROPLASTY Right 08/14/2021   Procedure: TOTAL HIP ARTHROPLASTY ANTERIOR APPROACH;  Surgeon: Melrose Nakayama, MD;  Location[facility];  Service: Orthopedics;  Laterality: Right;   TOTAL KNEE ARTHROPLASTY Left 02/13/2021   Procedure: LEFT TOTAL KNEE ARTHROPLASTY;  Surgeon: Melrose Nakayama, MD;  Location: [facility];  Service: Orthopedics;  Laterality: Left;   VAGINAL HYSTERECTOMY  07/22/1968   WRIST FRACTURE SURGERY Left     Current Outpatient Medications  Medication Sig Dispense Refill   acetaminophen (TYLENOL) 500 MG tablet Take 1,000 mg by mouth every 6 (six) hours as needed for moderate pain.     albuterol (VENTOLIN HFA) 108 (90 Base) MCG/ACT inhaler Inhale 2 puffs into the lungs every 6 (six) hours as needed for wheezing or shortness of breath. 8 g 2   alendronate (FOSAMAX) 70 MG tablet Take 1 tablet (70 mg total) by mouth every 7 (seven) days. Take with a full glass of water on an empty stomach. 4 tablet 11   apixaban (ELIQUIS) 5 MG TABS tablet Take 1 tablet (5 mg total) by mouth 2 (two) times daily. 180 tablet 3   B Complex-C (B-COMPLEX WITH VITAMIN C) tablet Take 1 tablet by mouth  daily with lunch.      CARTIA XT 120 MG 24 hr capsule TAKE ONE CAPSULE BY MOUTH IN THE MORNING AND ONE CAPSULE AT BEDTIME 180 capsule 0   Cholecalciferol (VITAMIN D3) 5000 UNITS CAPS Take 5,000 Units by mouth daily with breakfast.      dofetilide (TIKOSYN) 500 MCG capsule TAKE ONE CAPSULE BY MOUTH TWICE DAILY 180 capsule 3   furosemide (LASIX) 40 MG tablet Take 1 tablet (40 mg total) by mouth daily. 90 tablet 3   isosorbide mononitrate (IMDUR) 30 MG 24 hr tablet Take 1 tablet (30 mg total) by mouth daily. 90 tablet 3   lisinopril (ZESTRIL) 10 MG tablet Take 1 tablet (10 mg total) by mouth daily. 90 tablet 3   Lutein 20 MG TABS Take 20 mg by mouth daily with breakfast.     magnesium oxide (MAG-OX) 400 MG tablet Take 1 tablet (400 mg total) by mouth daily. 30 tablet 0   Melatonin 10 MG TABS Take 10-40 mg by mouth at bedtime as needed (sleep).     metoprolol succinate (TOPROL-XL) 50 MG 24 hr tablet Take 2 tablets (100 mg total) by mouth 3 (three) times daily. Take with or immediately following a meal. 540 tablet 3   Multiple Vitamin (MULTIVITAMIN WITH MINERALS) TABS tablet Take 1 tablet by mouth daily with breakfast.      Multiple Vitamins-Minerals (PRESERVISION AREDS 2 PO) Take 1 capsule by mouth 2 (two) times daily with a meal.      potassium chloride (KLOR-CON) 10 MEQ tablet Take 1 tablet (10 mEq total) by mouth daily. 90 tablet 3   PRESCRIPTION MEDICATION Inhale into the lungs at bedtime. CPAP     sertraline (ZOLOFT) 50 MG tablet Take 1 tablet (50 mg total) by mouth daily. 30 tablet 2   No current facility-administered medications for this visit.    Allergies:   Citalopram hydrobromide, Adhesive [tape], Bacitracin-polymyxin b, Benzalkonium chloride, and Neosporin [neomycin-polymyxin-gramicidin]   Social History:  The patient  reports that she quit smoking about 31 years ago. Her smoking use included cigarettes. She has a 45.00 pack-year smoking history. She has never used smokeless tobacco.  She reports current alcohol use of about 28.0 standard drinks of alcohol per week. She reports that she does not use drugs.  Family History:  The patient's family history includes Asthma in her brother; Breast cancer in her paternal aunt; Cancer in her father; Colon cancer in her mother; Heart disease in her brother; Skin cancer in her brother; Stroke in her sister.  ROS:  Please see the history of present illness.    All other systems are reviewed and otherwise negative.   PHYSICAL EXAM:  VS:  LMP 07/22/1968  BMI: There is no height or weight on file to calculate BMI. Well nourished, well developed, in no acute distress HEENT: normocephalic, atraumatic Neck: no JVD, carotid bruits or masses Cardiac:  RRR; no significant murmurs, no rubs, or gallops Lungs:  CTA b/l, no wheezing, rhonchi or rales Abd: soft, nontender MS: no deformity or atrophy Ext: no edema Skin: warm and dry, no rash Neuro:  No gross deficits appreciated Psych: euthymic mood, full affect  PPM site is stable, no tethering or discomfort   EKG:  S done today and reviewed by myself SR 81bpm, QTc 468m  Device interrogation done today and reviewed by myself:  Battery (estimate is 7 months), and lead measurements are good AFib burden is 19% No HVR episodes   02/06/22: stress myoview   Findings are consistent with ischemia. The study is low risk.   No ST deviation was noted.   LV perfusion is abnormal. There is evidence of ischemia. Defect 1: There is a small defect with mild reduction in uptake present in the apical to mid inferior location(s) that is reversible. There is normal wall motion in the defect area. Consistent with ischemia.   Left ventricular function is normal. Nuclear stress EF: 69 %. The left ventricular ejection fraction is hyperdynamic (>65%). End diastolic cavity size is normal. End systolic cavity size is normal.   Prior study available for comparison from 03/21/2021.   Reversible perfusion defect  in apical to mid inferior wall consistent with ischemia Low risk study.  Area of ischemia is small   08/10/21: TTE 1. Left ventricular ejection fraction, by estimation, is 55 to 60%. The  left ventricle has normal function. The left ventricle has no regional  wall motion abnormalities. There is mild asymmetric left ventricular  hypertrophy of the basal and septal  segments. Left ventricular diastolic parameters are consistent with Grade  I diastolic dysfunction (impaired relaxation). The average left  ventricular global longitudinal strain is -20.1 %. The global longitudinal  strain is normal.   2. Pacing wires in RA/RV . Right ventricular systolic function is normal.  The right ventricular size is normal. There is moderately elevated  pulmonary artery systolic pressure.   3. Left atrial size was moderately dilated.   4. Right atrial size was moderately dilated.   5. The mitral valve is degenerative. Mild to moderate mitral valve  regurgitation. No evidence of mitral stenosis.   6. Tricuspid valve regurgitation is mild to moderate.   7. The aortic valve is tricuspid. There is moderate calcification of the  aortic valve. There is moderate thickening of the aortic valve. Aortic  valve regurgitation is mild. Aortic valve sclerosis/calcification is  present, without any evidence of aortic  stenosis.   8. Aortic dilatation noted. There is mild dilatation of the ascending  aorta, measuring 38 mm.   9. The inferior vena cava is normal in size with greater than 50%  respiratory variability, suggesting right atrial pressure of 3 mmHg.   Comparison(s): 12/31/17 EF 55-60%.    Recent Labs: 07/30/2021: TSH 3.120 09/26/2021: Hemoglobin 11.9; Magnesium 1.9; Platelets 282  12/21/2021: NT-Pro BNP 1,081 02/06/2022: BUN 13; Creatinine, Ser 0.68; Potassium 4.4; Sodium 137  No results found for requested labs within last 365 days.   CrCl cannot be calculated (Unknown ideal weight.).   Wt Readings from  Last 3 Encounters:  01/09/22 209 lb (94.8 kg)  12/21/21 209 lb (94.8 kg)  11/06/21 205 lb 6.4 oz (93.2 kg)     Other studies reviewed: Additional studies/records reviewed today include: summarized above  ASSESSMENT AND PLAN:  PPM Intact function No programming changes made nearing ERI  Paroxysmal AFib ATach CHA2DS2Vasc is 6, on Eliquis,  appropriately dosed Tikosyn QTc is OK Burden waxes/wanes last 27mo despite this, I do not think it explais her more regular DOE as described   HTN Looks OK   SOB Likely multifactorial Unclear if her increased AF burden is the cause of her SOB, though don't think so. She can walk with SOB using her walker, but gets SOB when walking un-aided No pulmonary process by her w/u Exam does not appear volume OL Stress test with small ischemic defect Intolerant of Imdur with diarrhea No CP Continue furosemide D/w Dr. KCaryl ComesShe would like to avoid cath, plan for coronary CTa Consider Farxiga or jardiance if able once diarrhea is resolved, if CT does not explain her SOB    Disposition: f/u with Dr. KCaryl Comesas scheduled    Current medicines are reviewed at length with the patient today.  The patient did not have any concerns regarding medicines.  SVenetia Night PA-C 02/12/2022 1:06 PM     [facility] [address] [phone])  [phone])

## 2022-02-12 NOTE — Telephone Encounter (Signed)
Called pt to f/u on her reports of diarrhea She reports that it continues.  She is not having multiple stools a day, but once a day and only at night a watery stool. She has not taken the Imdur any longer, discussed not a known side effect of Imdur though started after she started it. Has not reached out to her PMD. No blood No other symptoms. She was going to try imodium.  I discussed with Burns today, advised against daily use of imodium, but an occasional or infrequent use would be ok. I made the pt aware, not to take more then one dose today and we would see her at her scheduled visit tomorrow for labs/ekg.  She would as well, reach out to her PMD  Tommye Standard, PA-C

## 2022-02-13 ENCOUNTER — Encounter: Payer: Self-pay | Admitting: Physician Assistant

## 2022-02-13 ENCOUNTER — Ambulatory Visit (HOSPITAL_COMMUNITY)
Admission: RE | Admit: 2022-02-13 | Discharge: 2022-02-13 | Disposition: A | Discharge status: Home / Self Care | Payer: Medicare Other | Source: Ambulatory Visit | Attending: Interventional Radiology | Admitting: Interventional Radiology

## 2022-02-13 ENCOUNTER — Ambulatory Visit (INDEPENDENT_AMBULATORY_CARE_PROVIDER_SITE_OTHER): Payer: Medicare Other | Admitting: Physician Assistant

## 2022-02-13 VITALS — BP 146/78 | HR 82 | Ht 68.0 in | Wt 210.2 lb

## 2022-02-13 DIAGNOSIS — R0602 Shortness of breath: Secondary | ICD-10-CM

## 2022-02-13 DIAGNOSIS — I4819 Other persistent atrial fibrillation: Secondary | ICD-10-CM

## 2022-02-13 DIAGNOSIS — Z950 Presence of cardiac pacemaker: Secondary | ICD-10-CM | POA: Diagnosis not present

## 2022-02-13 DIAGNOSIS — I10 Essential (primary) hypertension: Secondary | ICD-10-CM | POA: Diagnosis not present

## 2022-02-13 DIAGNOSIS — M1711 Unilateral primary osteoarthritis, right knee: Secondary | ICD-10-CM | POA: Diagnosis not present

## 2022-02-13 DIAGNOSIS — I771 Stricture of artery: Secondary | ICD-10-CM

## 2022-02-13 DIAGNOSIS — Z96641 Presence of right artificial hip joint: Secondary | ICD-10-CM | POA: Diagnosis not present

## 2022-02-13 NOTE — Patient Instructions (Addendum)
Medication Instructions:  Your physician recommends that you continue on your current medications as directed. Please refer to the Current Medication list given to you today.  Labwork: You will have blood work drawn today:  BMET and Magnesium level.   Testing/Procedures:  Coronary  CTA, see instructions  Follow-Up:  Your physician wants you to follow-up in: 6 weeks with Tommye Standard, PA-C   Remote monitoring is used to monitor your Pacemaker from home. This monitoring reduces the number of office visits required to check your device to one time per year. It allows [place] to keep an eye on the functioning of your device to ensure it is working properly. You are scheduled for a device check from home on 02/27/22. You may send your transmission at any time that day. If you have a wireless device, the transmission will be sent automatically. After your physician reviews your transmission, you will receive a postcard with your next transmission date.  Any Other Special Instructions Will Be Listed Below (If Applicable).  If you need a refill on your cardiac medications before your next appointment, please call your pharmacy.   Important Information About Sugar        Your cardiac CT will be scheduled at one of the below locations:   [facility] [address] [place], [place] [address] [phone]    Please arrive at the [facility] Entrance (Entrance C2) of [facility] 30 minutes prior to test start time. You can use the FREE valet parking offered at entrance C (encouraged to control the heart rate for the test)  Proceed to the [facility] Radiology Department (first floor) to check-in and test prep.  All radiology patients and guests should use entrance C2 at [facility], accessed from [facility], even though the hospital's physical address listed is [address]      Please follow these instructions carefully  (unless otherwise directed):    On the Night Before the Test: Be sure to Drink plenty of water. Do not consume any caffeinated/decaffeinated beverages or chocolate 12 hours prior to your test. Do not take any antihistamines 12 hours prior to your test.   On the Day of the Test: Drink plenty of water until 1 hour prior to the test. Do not eat any food 4 hours prior to the test. You may take your regular medications prior to the test.  Take metoprolol (Lopressor) two hours prior to test. HOLD Furosemide morning of the test. FEMALES- please wear underwire-free bra if available, avoid dresses & tight clothing       After the Test: Drink plenty of water. After receiving IV contrast, you may experience a mild flushed feeling. This is normal. On occasion, you may experience a mild rash up to 24 hours after the test. This is not dangerous. If this occurs, you can take Benadryl 25 mg and increase your fluid intake. If you experience trouble breathing, this can be serious. If it is severe call 911 IMMEDIATELY. If it is mild, please call our office. If you take any of these medications: Glipizide/Metformin, Avandament, Glucavance, please do not take 48 hours after completing test unless otherwise instructed.  We will call to schedule your test 2-4 weeks out understanding that some insurance companies will need an authorization prior to the service being performed.   For non-scheduling related questions, please contact the cardiac imaging nurse navigator should you have any questions/concerns: Marchia Bond, Cardiac Imaging Nurse Navigator Kerin Ransom  Jearld Shines, Cardiac Imaging Nurse Navigator [facility] Direct Office Dial: [phone]   For scheduling needs, including cancellations and rescheduling, please call [place], [phone].

## 2022-02-13 NOTE — Progress Notes (Signed)
Bilateral carotid duplex study completed. Please see CV Proc for preliminary results.  Takao Lizer BS, RVT 02/13/2022 10:43 AM

## 2022-02-14 ENCOUNTER — Telehealth: Payer: Self-pay

## 2022-02-14 ENCOUNTER — Telehealth (HOSPITAL_COMMUNITY): Payer: Self-pay

## 2022-02-14 LAB — MAGNESIUM: Magnesium: 1.9 mg/dL (ref 1.6–2.3)

## 2022-02-14 LAB — BASIC METABOLIC PANEL
BUN/Creatinine Ratio: 20 (ref 12–28)
BUN: 15 mg/dL (ref 8–27)
CO2: 26 mmol/L (ref 20–29)
Calcium: 11.1 mg/dL — ABNORMAL HIGH (ref 8.7–10.3)
Chloride: 97 mmol/L (ref 96–106)
Creatinine, Ser: 0.76 mg/dL (ref 0.57–1.00)
Glucose: 108 mg/dL — ABNORMAL HIGH (ref 70–99)
Potassium: 3.9 mmol/L (ref 3.5–5.2)
Sodium: 139 mmol/L (ref 134–144)
eGFR: 77 mL/min/{1.73_m2} (ref >59)

## 2022-02-14 NOTE — Telephone Encounter (Signed)
Pt agreed to f/u in 6 months with a us carotid. AW 

## 2022-02-14 NOTE — Telephone Encounter (Signed)
-----   Message from Deboraha Sprang, MD sent at 02/09/2022  1:49 PM EDT ----- Please Inform Patient  Labs are normal x elevated Ca   seen  a couple of time Can we arrange for ionized Ca unlesss Dr Nancy Fetter has already looked into this   Thanks

## 2022-02-14 NOTE — Telephone Encounter (Signed)
Called pt regarding recent imaging, no answer, left vm. AW  

## 2022-02-14 NOTE — Telephone Encounter (Signed)
Spoke with pt and advised of lab result and recommendation per Dr Caryl Comes.  Pt states her PCP is not currently following elevated Ca+ and requests that her PCP follow for further recommendations and labs.  Pt thanked Therapist, sports for the phone call.

## 2022-02-15 DIAGNOSIS — I480 Paroxysmal atrial fibrillation: Secondary | ICD-10-CM | POA: Diagnosis not present

## 2022-02-15 DIAGNOSIS — F411 Generalized anxiety disorder: Secondary | ICD-10-CM | POA: Diagnosis not present

## 2022-02-17 ENCOUNTER — Other Ambulatory Visit: Payer: Self-pay | Admitting: Physician Assistant

## 2022-02-18 ENCOUNTER — Other Ambulatory Visit: Payer: Self-pay

## 2022-02-18 DIAGNOSIS — I5030 Unspecified diastolic (congestive) heart failure: Secondary | ICD-10-CM

## 2022-02-18 MED ORDER — DAPAGLIFLOZIN PROPANEDIOL 10 MG PO TABS: 10.0000 mg | ORAL_TABLET | Freq: Every day | ORAL | 3 refills | Status: DC

## 2022-02-27 ENCOUNTER — Ambulatory Visit (INDEPENDENT_AMBULATORY_CARE_PROVIDER_SITE_OTHER): Payer: Medicare Other

## 2022-02-27 ENCOUNTER — Telehealth: Payer: Self-pay

## 2022-02-27 ENCOUNTER — Ambulatory Visit: Payer: Medicare Other

## 2022-02-27 DIAGNOSIS — I495 Sick sinus syndrome: Secondary | ICD-10-CM

## 2022-02-27 LAB — CUP PACEART REMOTE DEVICE CHECK
Battery Impedance: 5116 Ohm
Battery Remaining Longevity: 6 mo
Battery Voltage: 2.66 V
Brady Statistic AP VP Percent: 3 %
Brady Statistic AP VS Percent: 10 %
Brady Statistic AS VP Percent: 6 %
Brady Statistic AS VS Percent: 81 %
Date Time Interrogation Session: 20230809102000
Implantable Lead Implant Date: 20121213
Implantable Lead Implant Date: 20121213
Implantable Lead Location: 753859
Implantable Lead Location: 753860
Implantable Lead Model: 4076
Implantable Lead Model: 4076
Implantable Lead Serial Number: 835025
Implantable Lead Serial Number: 869269
Implantable Pulse Generator Implant Date: 20121213
Lead Channel Impedance Value: 396 Ohm
Lead Channel Impedance Value: 531 Ohm
Lead Channel Pacing Threshold Amplitude: 0.625 V
Lead Channel Pacing Threshold Amplitude: 1 V
Lead Channel Pacing Threshold Pulse Width: 0.4 ms
Lead Channel Pacing Threshold Pulse Width: 0.4 ms
Lead Channel Setting Pacing Amplitude: 2 V
Lead Channel Setting Pacing Amplitude: 2.5 V
Lead Channel Setting Pacing Pulse Width: 0.4 ms
Lead Channel Setting Sensing Sensitivity: 4 mV

## 2022-02-27 NOTE — Telephone Encounter (Signed)
I let the patient know that she has 6 months on her battery.

## 2022-02-27 NOTE — Telephone Encounter (Signed)
Increased ppm battery checks to monthly. Attempted to notify patient. No answer, LMTCB.

## 2022-02-28 LAB — CUP PACEART REMOTE DEVICE CHECK
Battery Impedance: 5116 Ohm
Battery Remaining Longevity: 6 mo
Battery Voltage: 2.66 V
Brady Statistic AP VP Percent: 3 %
Brady Statistic AP VS Percent: 10 %
Brady Statistic AS VP Percent: 6 %
Brady Statistic AS VS Percent: 81 %
Date Time Interrogation Session: 20230809102000
Implantable Lead Implant Date: 20121213
Implantable Lead Implant Date: 20121213
Implantable Lead Location: 753859
Implantable Lead Location: 753860
Implantable Lead Model: 4076
Implantable Lead Model: 4076
Implantable Lead Serial Number: 835025
Implantable Lead Serial Number: 869269
Implantable Pulse Generator Implant Date: 20121213
Lead Channel Impedance Value: 396 Ohm
Lead Channel Impedance Value: 531 Ohm
Lead Channel Pacing Threshold Amplitude: 0.625 V
Lead Channel Pacing Threshold Amplitude: 1 V
Lead Channel Pacing Threshold Pulse Width: 0.4 ms
Lead Channel Pacing Threshold Pulse Width: 0.4 ms
Lead Channel Setting Pacing Amplitude: 2 V
Lead Channel Setting Pacing Amplitude: 2.5 V
Lead Channel Setting Pacing Pulse Width: 0.4 ms
Lead Channel Setting Sensing Sensitivity: 4 mV

## 2022-03-01 ENCOUNTER — Encounter (HOSPITAL_COMMUNITY): Payer: Self-pay

## 2022-03-04 ENCOUNTER — Telehealth (HOSPITAL_COMMUNITY): Payer: Self-pay | Admitting: *Deleted

## 2022-03-04 ENCOUNTER — Encounter: Payer: Self-pay | Admitting: Internal Medicine

## 2022-03-04 ENCOUNTER — Telehealth: Payer: Self-pay | Admitting: Internal Medicine

## 2022-03-04 NOTE — Telephone Encounter (Signed)
This has encounter has been addressed in another encounter.  Please see encounter dated 03/04/2022 for complete details.

## 2022-03-04 NOTE — Telephone Encounter (Signed)
Reaching out to patient to offer assistance regarding upcoming cardiac imaging study; pt verbalizes understanding of appt date/time, parking situation and where to check in, pre-test NPO status and verified current allergies; name and call back number provided for further questions should they arise  Gordy Clement RN Navigator [facility] [phone] office [phone] cell  Patient is aware of need for a slower HR and reports that her HR is currently in the 80's-90's. After consulting with the ordering provider, Tommye Standard PA, patient to take '200mg'$  metoprolol succinate two hours prior to her cardiac CT scan. She is aware to arrive at 3:30.

## 2022-03-04 NOTE — Telephone Encounter (Signed)
  Pt c/o medication issue:  1. Name of Medication: dapagliflozin propanediol (FARXIGA) 10 MG TABS tablet  2. How are you currently taking this medication (dosage and times per day)? Take 1 tablet (10 mg total) by mouth daily before breakfast.  3. Are you having a reaction (difficulty breathing--STAT)?   4. What is your medication issue? Pt said, after taking this medication for 2 weeks, there is no improvement with her breathing. She wanted to know if she needs to continue taking it. If yes she needs a written prescription ready so she can pick it up tomorrow when she get her blood work done

## 2022-03-05 ENCOUNTER — Ambulatory Visit (HOSPITAL_COMMUNITY)
Admission: RE | Admit: 2022-03-05 | Discharge: 2022-03-05 | Disposition: A | Discharge status: Home / Self Care | Payer: Medicare Other | Source: Ambulatory Visit | Attending: Physician Assistant | Admitting: Physician Assistant

## 2022-03-05 ENCOUNTER — Other Ambulatory Visit: Payer: Medicare Other

## 2022-03-05 ENCOUNTER — Encounter (HOSPITAL_COMMUNITY): Payer: Self-pay

## 2022-03-05 DIAGNOSIS — Z950 Presence of cardiac pacemaker: Secondary | ICD-10-CM

## 2022-03-05 DIAGNOSIS — I4819 Other persistent atrial fibrillation: Secondary | ICD-10-CM

## 2022-03-05 DIAGNOSIS — I5030 Unspecified diastolic (congestive) heart failure: Secondary | ICD-10-CM | POA: Diagnosis not present

## 2022-03-05 DIAGNOSIS — I10 Essential (primary) hypertension: Secondary | ICD-10-CM

## 2022-03-05 NOTE — Progress Notes (Signed)
Patient arrived for her Coronary CT however was in A-Fib HR 80-108. Spoke with Dr Stanford Breed who is reading today and to cancel her CT today and to reschedule. Notified Dr Jens Som, Tommye Standard PA, and nurse navigators.

## 2022-03-06 LAB — BASIC METABOLIC PANEL
BUN/Creatinine Ratio: 18 (ref 12–28)
BUN: 13 mg/dL (ref 8–27)
CO2: 24 mmol/L (ref 20–29)
Calcium: 10.8 mg/dL — ABNORMAL HIGH (ref 8.7–10.3)
Chloride: 95 mmol/L — ABNORMAL LOW (ref 96–106)
Creatinine, Ser: 0.73 mg/dL (ref 0.57–1.00)
Glucose: 111 mg/dL — ABNORMAL HIGH (ref 70–99)
Potassium: 4.7 mmol/L (ref 3.5–5.2)
Sodium: 135 mmol/L (ref 134–144)
eGFR: 81 mL/min/{1.73_m2} (ref >59)

## 2022-03-21 ENCOUNTER — Ambulatory Visit: Payer: Medicare Other | Attending: Internal Medicine | Admitting: Internal Medicine

## 2022-03-21 ENCOUNTER — Encounter: Payer: Self-pay | Admitting: Internal Medicine

## 2022-03-21 VITALS — BP 136/82 | HR 85 | Ht 68.0 in | Wt 210.0 lb

## 2022-03-21 DIAGNOSIS — I495 Sick sinus syndrome: Secondary | ICD-10-CM

## 2022-03-21 DIAGNOSIS — I4819 Other persistent atrial fibrillation: Secondary | ICD-10-CM | POA: Insufficient documentation

## 2022-03-21 DIAGNOSIS — I5030 Unspecified diastolic (congestive) heart failure: Secondary | ICD-10-CM | POA: Diagnosis not present

## 2022-03-21 LAB — CUP PACEART INCLINIC DEVICE CHECK
Battery Impedance: 5174 Ohm
Battery Remaining Longevity: 6 mo
Battery Voltage: 2.67 V
Brady Statistic AP VP Percent: 3 %
Brady Statistic AP VS Percent: 10 %
Brady Statistic AS VP Percent: 7 %
Brady Statistic AS VS Percent: 80 %
Date Time Interrogation Session: 20230831170715
Implantable Lead Implant Date: 20121213
Implantable Lead Implant Date: 20121213
Implantable Lead Location: 753859
Implantable Lead Location: 753860
Implantable Lead Model: 4076
Implantable Lead Model: 4076
Implantable Lead Serial Number: 835025
Implantable Lead Serial Number: 869269
Implantable Pulse Generator Implant Date: 20121213
Lead Channel Impedance Value: 400 Ohm
Lead Channel Impedance Value: 548 Ohm
Lead Channel Pacing Threshold Amplitude: 0.625 V
Lead Channel Pacing Threshold Amplitude: 0.875 V
Lead Channel Pacing Threshold Pulse Width: 0.4 ms
Lead Channel Pacing Threshold Pulse Width: 0.4 ms
Lead Channel Sensing Intrinsic Amplitude: 1.4 mV
Lead Channel Sensing Intrinsic Amplitude: 11.2 mV
Lead Channel Setting Pacing Amplitude: 2 V
Lead Channel Setting Pacing Amplitude: 2.5 V
Lead Channel Setting Pacing Pulse Width: 0.4 ms
Lead Channel Setting Sensing Sensitivity: 4 mV

## 2022-03-21 MED ORDER — AMIODARONE HCL 200 MG PO TABS: 200.0000 mg | ORAL_TABLET | Freq: Two times a day (BID) | ORAL | 0 refills | Status: DC

## 2022-03-21 MED ORDER — AMIODARONE HCL 200 MG PO TABS: 200.0000 mg | ORAL_TABLET | Freq: Every day | ORAL | 3 refills | Status: DC

## 2022-03-21 MED ORDER — FUROSEMIDE 40 MG PO TABS: 80.0000 mg | ORAL_TABLET | Freq: Every day | ORAL | 3 refills | Status: DC

## 2022-03-21 NOTE — Progress Notes (Signed)
Patient Care Team: Donald Prose, MD as PCP - General (Family Medicine) Deboraha Sprang, MD as PCP - Electrophysiology (Cardiology) Deboraha Sprang, MD as PCP - Cardiology (Cardiology) Bjorn Loser, MD as Consulting Physician (Urology) Deboraha Sprang, MD as Consulting Physician (Cardiology) Chesley Mires, MD as Consulting Physician (Pulmonary Disease) Melrose Nakayama, MD as Consulting Physician (Orthopedic Surgery) Antionette Fairy Isaias Cowman, MD as Referring Physician (Ophthalmology) Paulla Dolly Tamala Fothergill, DPM as Consulting Physician (Podiatry)   HPI  Brittany [name] is a 88 y.o. female Seen in followup for  pacer Medtronic implanted 12/12 for sinus node dysfunction.  She has atrial fibrillation for which she previously took sotalol but more recently has been transitioned to dofetilide.  Anticoagulation with apixaban  She will be undergoing a R knee arthroplasty 08/14/21. She had her L knee replaced 02/13/21. She endorses a history of stroke occurring 2 to 3 years ago and was put on Eliquis. During her previous surgery, she stopped the Eliquis 3 days precluding spinal  Of note, she also hope to replace her R hip because of pain and bone degeneration. Following her surgery, she developed what has been a relatively long-term issue with dyspnea which has been somewhat perplexing.  Seen by pulm the other day with concerns re fluid overload and started on furosemide >> their evaluation was with clear lungs unlikely to be pulmonary  There has been some increased atrial fibrillation burden BNPs have been elevated but has not improved with either diuretics or inhalers.  She has seen RU-AP a number of times with continued complaints.  It has been noted that she can walk with her walker without shortness of breath but when ambulating without becomes quite short of breath.  The patient denies chest pain, nocturnal dyspnea, orthopnea.  There have been no palpitations, lightheadedness or syncope.   Complains of shortness of breath with exertion as well as edema.  Her urine output response to furosemide is scant, she has fairly regular small volume frequent urination independent of her diuretic.  Has noted some peripheral edema but no abdominal distention.  Her dyspnea is relatively in variable.  (See below) she is not aware of her atrial fibrillation although the burden is increasing  (See Below)   She is having significant pain in her right knee and would like to undergo surgery for relief.Marland Kitchen        DATE TEST EF    8/12  Myoview  Normal LV No ischemia  6/19 Echo   55-60 %     1/23 Echo   55-60% BAE mod    Date Cr K Mg Hgb   5/17   1.9   1/18  0.59 4.5   14.7  11/18 0.55 4.4  15.8  3/20 0.61 4.4 2.1 12.9  9/20 0.63 4.9 2.0   8/21    13.0  10/21 0.69 4.7 1.9   7/22 0.64 3.9  14.1  8/22 0.49 4.5 2.0   1/23 0.66 4.1  8.8  3/23    11.9  8/23 0.73 4.7     Thromboembolic risk factors ( age  -2, HTN-1, Gender-1, Vascular disease +/- 1) for a CHADSVASc Score of >=4   Past Medical History:  Diagnosis Date   Ankle fracture 2016   Arthritis    Atrial fibrillation (HCC)    Depression    Dysrhythmia    Elevated parathyroid hormone    Fracture of orbital floor with routine healing    GERD (gastroesophageal reflux disease)  History of colon polyps    benign   Hypercalcemia    Hypertension    Hyponatremia    Hypothyroid    Macular degeneration    wet in the right and dry in the left   OSA on CPAP    Osteoporosis    Peripheral vascular disease (HCC)    Presence of permanent cardiac pacemaker    PSVT (paroxysmal supraventricular tachycardia) (HCC)    Rotator cuff tear    Sinus node dysfunction (HCC)    a. s/p MDT pacemaker   Stroke [facility])    Urinary incontinence    Wrist fracture 2018    Past Surgical History:  Procedure Laterality Date   BACK SURGERY     CATARACT EXTRACTION     CATARACT EXTRACTION W/ INTRAOCULAR LENS  IMPLANT, BILATERAL Bilateral    COLONOSCOPY      ESOPHAGOGASTRODUODENOSCOPY     IR ANGIO INTRA EXTRACRAN SEL COM CAROTID INNOMINATE BILAT MOD SED  12/30/2017   IR ANGIO VERTEBRAL SEL VERTEBRAL BILAT MOD SED  12/30/2017   LAPAROSCOPIC CHOLECYSTECTOMY  08/11/1999   LUMBAR [place] [facility]ERY  02/19/2014   ORIF ANKLE FRACTURE Left 04/13/2015   Procedure: OPEN REDUCTION INTERNAL FIXATION (ORIF) LEFT ANKLE FRACTURE;  Surgeon: Meredith Pel, MD;  Location: [place];  Service: Orthopedics;  Laterality: Left;   PACEMAKER PLACEMENT Right 07/22/2010   a. MDT dual chamber PPM implanted by Dr Caryl Comes    SHOULDER ARTHROSCOPY W/ ROTATOR CUFF REPAIR Right 08/30/2014   WITH MINI-OPEN ROTATOR CUFF REPAIR AND SUBACROMIAL DECOMPRESSION   SHOULDER ARTHROSCOPY WITH ROTATOR CUFF REPAIR AND SUBACROMIAL DECOMPRESSION Right 08/30/2014   Procedure: SHOULDER ARTHROSCOPY WITH MINI-OPEN ROTATOR CUFF REPAIR AND SUBACROMIAL DECOMPRESSION;  Surgeon: Meredith Pel, MD;  Location: [place];  Service: Orthopedics;  Laterality: Right;  RIGHT SHOULDER DIAGNOSTIC OPERATIVE ARTHROSCOPY, SUBACROMIAL DECOMPRESSION, MINI-OPEN ROTATOR CUFF REPAIR.   TOTAL HIP ARTHROPLASTY Right 08/14/2021   Procedure: TOTAL HIP ARTHROPLASTY ANTERIOR APPROACH;  Surgeon: Melrose Nakayama, MD;  Location: [facility];  Service: Orthopedics;  Laterality: Right;   TOTAL KNEE ARTHROPLASTY Left 02/13/2021   Procedure: LEFT TOTAL KNEE ARTHROPLASTY;  Surgeon: Melrose Nakayama, MD;  Location: [facility];  Service: Orthopedics;  Laterality: Left;   VAGINAL HYSTERECTOMY  07/22/1968   WRIST FRACTURE SURGERY Left     Current Outpatient Medications  Medication Sig Dispense Refill   acetaminophen (TYLENOL) 500 MG tablet Take 1,000 mg by mouth every 6 (six) hours as needed for moderate pain.     alendronate (FOSAMAX) 70 MG tablet Take 1 tablet (70 mg total) by mouth every 7 (seven) days. Take with a full glass of water on an empty stomach. 4 tablet 11   apixaban (ELIQUIS) 5 MG TABS tablet Take 1 tablet (5 mg total) by mouth 2  (two) times daily. 180 tablet 3   B Complex-C (B-COMPLEX WITH VITAMIN C) tablet Take 1 tablet by mouth daily with lunch.      Cholecalciferol (VITAMIN D3) 250 MCG (10000 UT) capsule Take 10,000 Units by mouth daily with breakfast.     dapagliflozin propanediol (FARXIGA) 10 MG TABS tablet Take 1 tablet (10 mg total) by mouth daily before breakfast. 90 tablet 3   diltiazem (CARTIA XT) 120 MG 24 hr capsule TAKE 1 CAPSULE BY MOUTH EVERY MORNING AND 1 CAPSULE DAILY AT BEDTIME 180 capsule 3   dofetilide (TIKOSYN) 500 MCG capsule TAKE ONE CAPSULE BY MOUTH TWICE DAILY 180 capsule 3   lisinopril (ZESTRIL) 10 MG tablet Take 1 tablet (10 mg total)  by mouth daily. 90 tablet 3   Lutein 20 MG TABS Take 20 mg by mouth daily with breakfast.     magnesium oxide (MAG-OX) 400 MG tablet Take 1 tablet (400 mg total) by mouth daily. 30 tablet 0   Melatonin 10 MG TABS Take 10-40 mg by mouth at bedtime as needed (sleep).     metoprolol succinate (TOPROL-XL) 50 MG 24 hr tablet Take 2 tablets (100 mg total) by mouth 3 (three) times daily. Take with or immediately following a meal. 540 tablet 3   Multiple Vitamin (MULTIVITAMIN WITH MINERALS) TABS tablet Take 1 tablet by mouth daily with breakfast.      Multiple Vitamins-Minerals (PRESERVISION AREDS 2 PO) Take 1 capsule by mouth 2 (two) times daily with a meal.      PRESCRIPTION MEDICATION Inhale into the lungs at bedtime. CPAP     furosemide (LASIX) 40 MG tablet Take 1 tablet (40 mg total) by mouth daily. 90 tablet 3   potassium chloride (KLOR-CON) 10 MEQ tablet Take 1 tablet (10 mEq total) by mouth daily. 90 tablet 3   sertraline (ZOLOFT) 50 MG tablet Take 1 tablet (50 mg total) by mouth daily. 30 tablet 2   No current facility-administered medications for this visit.    Allergies  Allergen Reactions   Citalopram Hydrobromide Other (See Comments)    Other reaction(s): QT prolongation with Tikosyn   Adhesive [Tape] Swelling and Rash   Bacitracin-Polymyxin B Other  (See Comments)    Other reaction(s): Unknown   Benzalkonium Chloride Rash    Pt was not aware of this allergy   Neosporin [Neomycin-Polymyxin-Gramicidin] Swelling and Rash    Review of Systems negative except from HPI and PMH  Physical Exam: BP (!) 140/82   Pulse 85   Ht '5\' 8"'$  (1.727 m)   Wt 210 lb (95.3 kg)   LMP 07/22/1968   SpO2 95%   BMI 31.93 kg/m  Well developed and well nourished in no acute distress HENT normal Neck supple  Clear Device pocket well healed; without hematoma or erythema.  There is no tethering  Regular rate and rhythm, no  gallop No  murmur Abd-soft with active BS No Clubbing cyanosis 2+R> L edema Skin-warm and dry A & Oriented  Grossly normal sensory and motor function  ECG sinus at 85  intervals 14/09/41 Q waves V1 and V2 ECGs reviewed 2 and 3/23 there are small R waves in leads V1 and V2, suspect lead placement there  Assessment and  Plan Atrial fibrillation-persistent     Atrial tachycardia   Sinus bradycardia   HFpEF   Pacemaker-Medtronic     High Risk Medication Surveillance -Dofetilide  Hypertension  Hyperlipidemia  Bilateral edema    Continues to struggle with dyspnea which she dates to worsening after her knee surgery and has been relatively persistent and not variable on a day-to-day basis.  Review of her device demonstrates that she has had variable amounts of atrial fibrillation with an overall increased burden from about 14%--30%, but it is paroxysmal occurring for days and then not for days at a time; the fact that her breathing has been invariably bad not withstanding her atrial arrhythmia being variable suggests that they are not related.  We discussed next steps in this regard which included 1-continue dofetilide, 2-discontinuing dofetilide and doing nothing in 3-discontinuing dofetilide and using amiodarone.  We have elected on the latter with the hope that over the next 6-12 months it will become clear as to whether her  dyspnea tracks at all with her atrial fibrillation.  We have reviewed the side effects of the drugs.  We will check lab test in 2 weeks and again in 8 weeks.  (Her PFTs 4/23 did not include a DLCO)  Dyspnea persists.  She is mildly volume overloaded.  Response to her diuretic is inconsistent and ineffective.  We will increase her furosemide from 40--80 and we will plan to call her next week to see whether she has diuresed more vigorously with 80 mg.  We will plan a metabolic profile in 2 weeks time.  We will check a BMP at that same time.  The hope is been to do a CTA to look for coronary contribution to her symptoms, based on her abnormal Myoview.  She was in atrial fibrillation at the time.  Hopefully amiodarone will get her slow down and in sinus rhythm and will allow [place] to image her heart.

## 2022-03-21 NOTE — Patient Instructions (Addendum)
Medication Instructions:  Your physician has recommended you make the following change in your medication:  1.) stop Dofetilide (Tikosyn) 2.) start amiodarone 200 mg tablets - take 1 tablet TWO TIMES A DAY for four weeks, and then decrease to 1 tablet ONCE DAILY 3.) change furosemide (Lasix) to 80 mg - take 80 mg daily  *If you need a refill on your cardiac medications before your next appointment, please call your pharmacy*   Lab Work: Return in 2 weeks: BMET, BNP, TSH, LFTs Return in 8 weeks: LFTs, TSH  If you have labs (blood work) drawn today and your tests are completely normal, you will receive your results only by: [place] (if you have MyChart) OR A paper copy in the mail If you have any lab test that is abnormal or we need to change your treatment, we will call you to review the results.   Testing/Procedures: none   Follow-Up: At [facility], you and your health needs are our priority.  As part of our continuing mission to provide you with exceptional heart care, we have created designated Provider Care Teams.  These Care Teams include your primary Cardiologist (physician) and Advanced Practice Providers (APPs -  Physician Assistants and Nurse Practitioners) who all work together to provide you with the care you need, when you need it.   Your next appointment:   10 week(s)  The format for your next appointment:   In Person  Provider:   You will see one of the following Advanced Practice Providers on your designated Care Team:   Tommye Standard, [place]

## 2022-03-22 ENCOUNTER — Telehealth: Payer: Self-pay | Admitting: Internal Medicine

## 2022-03-22 NOTE — Telephone Encounter (Signed)
Pt c/o medication issue:  1. Name of Medication: amiodarone (PACERONE) 200 MG tablet  2. How are you currently taking this medication (dosage and times per day)? Take 1 tablet (200 mg total) by mouth daily  3. Are you having a reaction (difficulty breathing--STAT)?   4. What is your medication issue? [facility] called stating they received a script dated 8/31 with a start date of 04/19/22.  They need the start date removed.

## 2022-03-22 NOTE — Telephone Encounter (Signed)
Spoke w pharmacist.  He is able to fill both amiodarone prescriptions now and give both to the patient so that she can start on the daily dose once she completes the one month worth of loading dose.

## 2022-03-28 ENCOUNTER — Other Ambulatory Visit: Payer: Self-pay | Admitting: Family Medicine

## 2022-03-28 ENCOUNTER — Ambulatory Visit: Payer: Medicare Other | Admitting: Internal Medicine

## 2022-03-28 DIAGNOSIS — Z1231 Encounter for screening mammogram for malignant neoplasm of breast: Secondary | ICD-10-CM

## 2022-04-01 ENCOUNTER — Ambulatory Visit (INDEPENDENT_AMBULATORY_CARE_PROVIDER_SITE_OTHER): Payer: Medicare Other

## 2022-04-01 ENCOUNTER — Telehealth: Payer: Self-pay | Admitting: Internal Medicine

## 2022-04-01 DIAGNOSIS — I495 Sick sinus syndrome: Secondary | ICD-10-CM

## 2022-04-01 NOTE — Telephone Encounter (Signed)
Spoke with pt and advised Furosemide is ordered as '40mg'$  - 2 tablets by mouth daily.  Pt states she is doing well with no complaints at this time and thanked Therapist, sports for the phone call.

## 2022-04-01 NOTE — Telephone Encounter (Signed)
  Pt c/o medication issue:  1. Name of Medication: furosemide (LASIX) 40 MG tablet  2. How are you currently taking this medication (dosage and times per day)? Take 2 tablets (80 mg total) by mouth daily.  3. Are you having a reaction (difficulty breathing--STAT)?   4. What is your medication issue? Pt would like to clarify if she should take her lasix 1 tablet in the morning and 1 tablet in the evening or just take 2 tablets in the morning

## 2022-04-02 NOTE — Progress Notes (Signed)
Remote pacemaker transmission.   

## 2022-04-03 ENCOUNTER — Telehealth: Payer: Self-pay

## 2022-04-03 LAB — CUP PACEART REMOTE DEVICE CHECK
Battery Impedance: 5355 Ohm
Battery Remaining Longevity: 4 mo
Battery Voltage: 2.65 V
Brady Statistic AP VP Percent: 5 %
Brady Statistic AP VS Percent: 4 %
Brady Statistic AS VP Percent: 29 %
Brady Statistic AS VS Percent: 62 %
Date Time Interrogation Session: 20230911081413
Implantable Lead Implant Date: 20121213
Implantable Lead Implant Date: 20121213
Implantable Lead Location: 753859
Implantable Lead Location: 753860
Implantable Lead Model: 4076
Implantable Lead Model: 4076
Implantable Lead Serial Number: 835025
Implantable Lead Serial Number: 869269
Implantable Pulse Generator Implant Date: 20121213
Lead Channel Impedance Value: 388 Ohm
Lead Channel Impedance Value: 502 Ohm
Lead Channel Pacing Threshold Amplitude: 0.625 V
Lead Channel Pacing Threshold Amplitude: 1.125 V
Lead Channel Pacing Threshold Pulse Width: 0.4 ms
Lead Channel Pacing Threshold Pulse Width: 0.4 ms
Lead Channel Setting Pacing Amplitude: 2 V
Lead Channel Setting Pacing Amplitude: 2.5 V
Lead Channel Setting Pacing Pulse Width: 0.4 ms
Lead Channel Setting Sensing Sensitivity: 2.8 mV

## 2022-04-03 NOTE — Telephone Encounter (Signed)
Patient recently seen in-clinic and AF was noted 14-30%. Recent increase in AF burden to now 93.2%.   Routing to Dr. Caryl Comes to update.

## 2022-04-04 ENCOUNTER — Encounter: Payer: Self-pay | Admitting: Internal Medicine

## 2022-04-05 ENCOUNTER — Ambulatory Visit: Payer: Medicare Other | Attending: Internal Medicine

## 2022-04-05 DIAGNOSIS — I4819 Other persistent atrial fibrillation: Secondary | ICD-10-CM | POA: Insufficient documentation

## 2022-04-05 DIAGNOSIS — I5030 Unspecified diastolic (congestive) heart failure: Secondary | ICD-10-CM | POA: Diagnosis not present

## 2022-04-07 LAB — BASIC METABOLIC PANEL
BUN/Creatinine Ratio: 27 (ref 12–28)
BUN: 26 mg/dL (ref 8–27)
CO2: 29 mmol/L (ref 20–29)
Calcium: 10.6 mg/dL — ABNORMAL HIGH (ref 8.7–10.3)
Chloride: 96 mmol/L (ref 96–106)
Creatinine, Ser: 0.97 mg/dL (ref 0.57–1.00)
Glucose: 110 mg/dL — ABNORMAL HIGH (ref 70–99)
Potassium: 4 mmol/L (ref 3.5–5.2)
Sodium: 136 mmol/L (ref 134–144)
eGFR: 57 mL/min/{1.73_m2} — ABNORMAL LOW (ref >59)

## 2022-04-07 LAB — HEPATIC FUNCTION PANEL
ALT: 29 IU/L (ref 0–32)
AST: 39 IU/L (ref 0–40)
Albumin: 4.6 g/dL (ref 3.7–4.7)
Alkaline Phosphatase: 92 IU/L (ref 44–121)
Bilirubin Total: 0.5 mg/dL (ref 0.0–1.2)
Bilirubin, Direct: 0.16 mg/dL (ref 0.00–0.40)
Total Protein: 6.9 g/dL (ref 6.0–8.5)

## 2022-04-07 LAB — TSH: TSH: 7.41 u[IU]/mL — ABNORMAL HIGH (ref 0.450–4.500)

## 2022-04-07 LAB — PRO B NATRIURETIC PEPTIDE: NT-Pro BNP: 1211 pg/mL — ABNORMAL HIGH (ref 0–738)

## 2022-04-08 ENCOUNTER — Encounter: Payer: Self-pay | Admitting: Internal Medicine

## 2022-04-08 NOTE — Telephone Encounter (Signed)
See result note from today where Dr. Irish Lack DOD advised on this pts lab results and plan, while Dr. Caryl Comes is away this week.   Jettie Booze, MD  Thompson Grayer, RN Cc: P [facility] Triage BNP stable.  LFTs stable.  Renal function stable.  She appears to be hypothyroid.  Would forward to primary care doctor for further management.  Continue furosemide 80 mg daily.  Any new symptoms to report?

## 2022-04-09 NOTE — Telephone Encounter (Signed)
Nuala Alpha, LPN  3/87/5643 32:95 AM EDT Back to Top    The patient has been notified of the result and verbalized understanding.  All questions (if any) were answered.  Pt aware Dr. Caryl Comes is out of the office this week, and DOD Dr. Irish Lack was kindly able to review and advise on these results.    Pt aware that I will forward a copy of this result to her PCP on file, and she should call them to schedule a follow-up appt with them for sometime soon, for hypothyroidism.    Asked pt if she has any new symptoms to report, and she stated nothing new, but she is still experiencing ongoing sob.  Pt states sob has been ongoing over the last year. Pt aware I will run this back by DOD Dr. Irish Lack, and triage or Dr. Olin Pia RN will follow-up with her accordingly thereafter, if any changes need to be made.  Pt aware Dr. Olin Pia RN will be back in the office tomorrow.    Pt verbalized understanding and agrees with this plan.

## 2022-04-10 DIAGNOSIS — I10 Essential (primary) hypertension: Secondary | ICD-10-CM | POA: Diagnosis not present

## 2022-04-10 DIAGNOSIS — E785 Hyperlipidemia, unspecified: Secondary | ICD-10-CM | POA: Diagnosis not present

## 2022-04-10 DIAGNOSIS — M810 Age-related osteoporosis without current pathological fracture: Secondary | ICD-10-CM | POA: Diagnosis not present

## 2022-04-10 DIAGNOSIS — I5032 Chronic diastolic (congestive) heart failure: Secondary | ICD-10-CM | POA: Diagnosis not present

## 2022-04-10 DIAGNOSIS — I480 Paroxysmal atrial fibrillation: Secondary | ICD-10-CM | POA: Diagnosis not present

## 2022-04-10 NOTE — Telephone Encounter (Signed)
We started her on amio at the last visit Could you let me know what the AFib VR histogram looks like-- we can look at it together on Monday Thanks SK

## 2022-04-10 NOTE — Telephone Encounter (Signed)
J and M Thanks for your help Agree with PCP referral for TSH and we willhave to follow cloasely on Amio Will need a transmission in about 3 weeks to see if episodes are self limiting or whtehr she would benefit from [place] The HR seems slower which is a good thing

## 2022-04-11 NOTE — Telephone Encounter (Signed)
Printed report and will review with Dr. Caryl Comes 04/15/22.

## 2022-04-14 ENCOUNTER — Encounter: Payer: Self-pay | Admitting: Internal Medicine

## 2022-04-15 NOTE — Telephone Encounter (Signed)
I called the pt and urged her based on her symptoms to consider going to the ED but she firmly declined... I advised her to continue to monitor and if anything worsens to please let us know... she will try to get her BP checked.. I will forward to Dr Caryl Comes for his review.   Pt says with her eye problems it is not unusual for her to blurred vision... but I advised her the dizziness and almost falling is not normal.

## 2022-04-17 DIAGNOSIS — Z23 Encounter for immunization: Secondary | ICD-10-CM | POA: Diagnosis not present

## 2022-04-18 NOTE — Progress Notes (Signed)
Remote pacemaker transmission.   

## 2022-04-23 NOTE — Telephone Encounter (Signed)
Outreach made to Pt.  Requested an additional remote transmission to afib burden and histograms.  Pt will send.  Will review tomorrow 04/24/2022 with Dr. Caryl Comes.

## 2022-04-24 ENCOUNTER — Encounter: Payer: Self-pay | Admitting: Internal Medicine

## 2022-04-24 NOTE — Telephone Encounter (Signed)
Spoke with pt and advised per Dr Caryl Comes recommends pt be scheduled for a DCCV.  Pt states she has not missed any doses of Eliquis in the past 3 weeks.  Pt advised RN will schedule procedure and contact with date and time.  Pt verbalizes understanding and agrees with current plan.

## 2022-04-26 DIAGNOSIS — I4819 Other persistent atrial fibrillation: Secondary | ICD-10-CM

## 2022-04-26 NOTE — Telephone Encounter (Signed)
Spoke with pt and reviewed Cardioversion Instruction letter.  See letter for complete details.  Pt verbalizes understanding and agrees with current plan.

## 2022-05-02 ENCOUNTER — Ambulatory Visit (INDEPENDENT_AMBULATORY_CARE_PROVIDER_SITE_OTHER): Payer: Medicare Other

## 2022-05-02 ENCOUNTER — Ambulatory Visit (HOSPITAL_COMMUNITY)
Admission: RE | Admit: 2022-05-02 | Discharge: 2022-05-02 | Disposition: A | Discharge status: Home / Self Care | Payer: Medicare Other | Source: Ambulatory Visit | Attending: Internal Medicine | Admitting: Internal Medicine

## 2022-05-02 ENCOUNTER — Other Ambulatory Visit: Payer: Self-pay

## 2022-05-02 ENCOUNTER — Ambulatory Visit (HOSPITAL_COMMUNITY): Payer: Medicare Other | Admitting: Anesthesiology

## 2022-05-02 ENCOUNTER — Ambulatory Visit (HOSPITAL_BASED_OUTPATIENT_CLINIC_OR_DEPARTMENT_OTHER): Payer: Medicare Other | Admitting: Anesthesiology

## 2022-05-02 ENCOUNTER — Encounter (HOSPITAL_COMMUNITY): Payer: Self-pay | Admitting: Internal Medicine

## 2022-05-02 ENCOUNTER — Encounter (HOSPITAL_COMMUNITY)
Admission: RE | Disposition: A | Discharge status: Home / Self Care | Payer: Self-pay | Source: Ambulatory Visit | Attending: Internal Medicine

## 2022-05-02 DIAGNOSIS — Z87891 Personal history of nicotine dependence: Secondary | ICD-10-CM | POA: Diagnosis not present

## 2022-05-02 DIAGNOSIS — I5032 Chronic diastolic (congestive) heart failure: Secondary | ICD-10-CM | POA: Insufficient documentation

## 2022-05-02 DIAGNOSIS — E785 Hyperlipidemia, unspecified: Secondary | ICD-10-CM | POA: Insufficient documentation

## 2022-05-02 DIAGNOSIS — I495 Sick sinus syndrome: Secondary | ICD-10-CM

## 2022-05-02 DIAGNOSIS — Z79899 Other long term (current) drug therapy: Secondary | ICD-10-CM | POA: Insufficient documentation

## 2022-05-02 DIAGNOSIS — F418 Other specified anxiety disorders: Secondary | ICD-10-CM

## 2022-05-02 DIAGNOSIS — I110 Hypertensive heart disease with heart failure: Secondary | ICD-10-CM | POA: Insufficient documentation

## 2022-05-02 DIAGNOSIS — Z950 Presence of cardiac pacemaker: Secondary | ICD-10-CM | POA: Diagnosis present

## 2022-05-02 DIAGNOSIS — I10 Essential (primary) hypertension: Secondary | ICD-10-CM

## 2022-05-02 DIAGNOSIS — I4819 Other persistent atrial fibrillation: Secondary | ICD-10-CM | POA: Diagnosis present

## 2022-05-02 DIAGNOSIS — E669 Obesity, unspecified: Secondary | ICD-10-CM | POA: Insufficient documentation

## 2022-05-02 DIAGNOSIS — I2510 Atherosclerotic heart disease of native coronary artery without angina pectoris: Secondary | ICD-10-CM | POA: Diagnosis not present

## 2022-05-02 DIAGNOSIS — I4891 Unspecified atrial fibrillation: Secondary | ICD-10-CM

## 2022-05-02 HISTORY — PX: CARDIOVERSION: SHX1299

## 2022-05-02 LAB — CUP PACEART REMOTE DEVICE CHECK
Battery Impedance: 5695 Ohm
Battery Remaining Longevity: 3 mo
Battery Voltage: 2.64 V
Brady Statistic AP VP Percent: 7 %
Brady Statistic AP VS Percent: 1 %
Brady Statistic AS VP Percent: 46 %
Brady Statistic AS VS Percent: 46 %
Date Time Interrogation Session: 20231012081558
Implantable Lead Implant Date: 20121213
Implantable Lead Implant Date: 20121213
Implantable Lead Location: 753859
Implantable Lead Location: 753860
Implantable Lead Model: 4076
Implantable Lead Model: 4076
Implantable Lead Serial Number: 835025
Implantable Lead Serial Number: 869269
Implantable Pulse Generator Implant Date: 20121213
Lead Channel Impedance Value: 376 Ohm
Lead Channel Impedance Value: 486 Ohm
Lead Channel Pacing Threshold Amplitude: 0.625 V
Lead Channel Pacing Threshold Amplitude: 1 V
Lead Channel Pacing Threshold Pulse Width: 0.4 ms
Lead Channel Pacing Threshold Pulse Width: 0.4 ms
Lead Channel Setting Pacing Amplitude: 2 V
Lead Channel Setting Pacing Amplitude: 2.5 V
Lead Channel Setting Pacing Pulse Width: 0.4 ms
Lead Channel Setting Sensing Sensitivity: 2.8 mV

## 2022-05-02 LAB — POCT I-STAT, CHEM 8
BUN: 15 mg/dL (ref 8–23)
Calcium, Ion: 1.23 mmol/L (ref 1.15–1.40)
Chloride: 101 mmol/L (ref 98–111)
Creatinine, Ser: 0.8 mg/dL (ref 0.44–1.00)
Glucose, Bld: 112 mg/dL — ABNORMAL HIGH (ref 70–99)
HCT: 42 % (ref 36.0–46.0)
Hemoglobin: 14.3 g/dL (ref 12.0–15.0)
Potassium: 4.2 mmol/L (ref 3.5–5.1)
Sodium: 137 mmol/L (ref 135–145)
TCO2: 28 mmol/L (ref 22–32)

## 2022-05-02 SURGERY — CARDIOVERSION
Anesthesia: General

## 2022-05-02 MED ORDER — PROPOFOL 10 MG/ML IV BOLUS
INTRAVENOUS | Status: DC | PRN
  Administered 2022-05-02 (×2): 30 mg via INTRAVENOUS
  Administered 2022-05-02 (×2): 20 mg via INTRAVENOUS
  Administered 2022-05-02: 60 mg via INTRAVENOUS
  Administered 2022-05-02 (×2): 20 mg via INTRAVENOUS

## 2022-05-02 MED ORDER — SODIUM CHLORIDE 0.9 % IV SOLN: INTRAVENOUS | Status: DC

## 2022-05-02 MED ORDER — LIDOCAINE HCL (CARDIAC) PF 100 MG/5ML IV SOSY
PREFILLED_SYRINGE | INTRAVENOUS | Status: DC | PRN
  Administered 2022-05-02: 40 mg via INTRAVENOUS

## 2022-05-02 NOTE — Anesthesia Postprocedure Evaluation (Signed)
Anesthesia Post Note  Patient: Brittany [name]  Procedure(s) Performed: CARDIOVERSION     Patient location during evaluation: Endoscopy Anesthesia Type: General Level of consciousness: awake and sedated Pain management: pain level controlled Vital Signs Assessment: post-procedure vital signs reviewed and stable Respiratory status: spontaneous breathing Cardiovascular status: stable Postop Assessment: no apparent nausea or vomiting Anesthetic complications: no   No notable events documented.  Last Vitals:  Vitals:   05/02/22 1130 05/02/22 1135  BP: 135/84 (!) 151/85  Pulse: 67 75  Resp: 15 18  Temp:    SpO2: 96% 94%    Last Pain:  Vitals:   05/02/22 1135  TempSrc:   PainSc: 0-No pain                 Huston Foley

## 2022-05-02 NOTE — Discharge Instructions (Signed)

## 2022-05-02 NOTE — H&P (Signed)
HISTORY & PHYSICAL  Patient Name: Brittany [name] Date of Encounter: 05/02/2022 Primary Care Physician: Donald Prose, MD Cardiologist: Virl Axe, MD  Chief Complaint   afib  Patient Profile   88 yo female patient of Dr. Caryl Comes with afib  HPI   This is a 88 y.o. female with a past medical history significant for afib, HFpEF, s/p Medtronic pacemaker, HTN, and dyslipidemia recently switched from dofetilide to amiodarone by Dr. Caryl Comes for persistent dyspnea, thought to be symptomatic atrial fibrillation. She had an abnormal myoview stress test in June 2023 which showed a small reversible inferior wall perfusion defect. Coronary CT angiography was ordered, but not yet performed. She presents today for DCCV. She has been compliant with anticoagulation, no missed doses for at least 3 weeks.  PMHx   Past Medical History:  Diagnosis Date   Ankle fracture 2016   Arthritis    Atrial fibrillation (HCC)    Depression    Dysrhythmia    Elevated parathyroid hormone    Fracture of orbital floor with routine healing    GERD (gastroesophageal reflux disease)    History of colon polyps    benign   Hypercalcemia    Hypertension    Hyponatremia    Hypothyroid    Macular degeneration    wet in the right and dry in the left   OSA on CPAP    Osteoporosis    Peripheral vascular disease (HCC)    Presence of permanent cardiac pacemaker    PSVT (paroxysmal supraventricular tachycardia)    Rotator cuff tear    Sinus node dysfunction (HCC)    a. s/p MDT pacemaker   Stroke [facility])    Urinary incontinence    Wrist fracture 2018    Past Surgical History:  Procedure Laterality Date   BACK SURGERY     CATARACT EXTRACTION     CATARACT EXTRACTION W/ INTRAOCULAR LENS  IMPLANT, BILATERAL Bilateral    COLONOSCOPY     ESOPHAGOGASTRODUODENOSCOPY     IR ANGIO INTRA EXTRACRAN SEL COM CAROTID INNOMINATE BILAT MOD SED  12/30/2017   IR ANGIO VERTEBRAL SEL VERTEBRAL BILAT MOD SED  12/30/2017    LAPAROSCOPIC CHOLECYSTECTOMY  08/11/1999   LUMBAR [place] SURGERY  02/19/2014   ORIF ANKLE FRACTURE Left 04/13/2015   Procedure: OPEN REDUCTION INTERNAL FIXATION (ORIF) LEFT ANKLE FRACTURE;  Surgeon: Meredith Pel, MD;  Location: [place];  Service: Orthopedics;  Laterality: Left;   PACEMAKER PLACEMENT Right 07/22/2010   a. MDT dual chamber PPM implanted by Dr Caryl Comes    SHOULDER ARTHROSCOPY W/ ROTATOR CUFF REPAIR Right 08/30/2014   WITH MINI-OPEN ROTATOR CUFF REPAIR AND SUBACROMIAL DECOMPRESSION   SHOULDER ARTHROSCOPY WITH ROTATOR CUFF REPAIR AND SUBACROMIAL DECOMPRESSION Right 08/30/2014   Procedure: SHOULDER ARTHROSCOPY WITH MINI-OPEN ROTATOR CUFF REPAIR AND SUBACROMIAL DECOMPRESSION;  Surgeon: Meredith Pel, MD;  Location: [place];  Service: Orthopedics;  Laterality: Right;  RIGHT SHOULDER DIAGNOSTIC OPERATIVE ARTHROSCOPY, SUBACROMIAL DECOMPRESSION, MINI-OPEN ROTATOR CUFF REPAIR.   TOTAL HIP ARTHROPLASTY Right 08/14/2021   Procedure: TOTAL HIP ARTHROPLASTY ANTERIOR APPROACH;  Surgeon: Melrose Nakayama, MD;  Location: [facility];  Service: Orthopedics;  Laterality: Right;   TOTAL KNEE ARTHROPLASTY Left 02/13/2021   Procedure: LEFT TOTAL KNEE ARTHROPLASTY;  Surgeon: Melrose Nakayama, MD;  Location: [facility];  Service: Orthopedics;  Laterality: Left;   VAGINAL HYSTERECTOMY  07/22/1968   WRIST FRACTURE SURGERY Left     FAMHx   Family History  Problem Relation Age of Onset   Colon  cancer Mother        colon   Cancer Father        unknown type   Stroke Sister    Skin cancer Brother    Heart disease Brother    Breast cancer Paternal Aunt    Asthma Brother     SOCHx    reports that she quit smoking about 31 years ago. Her smoking use included cigarettes. She has a 45.00 [name]-year smoking history. She has never used smokeless tobacco. She reports current alcohol use of about 28.0 standard drinks of alcohol per week. She reports that she does not use drugs.  Outpatient Medications   No current  facility-administered medications on file prior to encounter.   Current Outpatient Medications on File Prior to Encounter  Medication Sig Dispense Refill   acetaminophen (TYLENOL) 500 MG tablet Take 500 mg by mouth 2 (two) times daily.     alendronate (FOSAMAX) 70 MG tablet Take 1 tablet (70 mg total) by mouth every 7 (seven) days. Take with a full glass of water on an empty stomach. 4 tablet 11   amiodarone (PACERONE) 200 MG tablet Take 1 tablet (200 mg total) by mouth daily. 90 tablet 3   apixaban (ELIQUIS) 5 MG TABS tablet Take 1 tablet (5 mg total) by mouth 2 (two) times daily. 180 tablet 3   atorvastatin (LIPITOR) 40 MG tablet Take 40 mg by mouth daily.     B Complex-C (B-COMPLEX WITH VITAMIN C) tablet Take 1 tablet by mouth daily with lunch.      Cholecalciferol (VITAMIN D3) 125 MCG (5000 UT) TABS Take 10,000 Units by mouth daily with breakfast.     dapagliflozin propanediol (FARXIGA) 10 MG TABS tablet Take 1 tablet (10 mg total) by mouth daily before breakfast. 90 tablet 3   diltiazem (CARTIA XT) 120 MG 24 hr capsule TAKE 1 CAPSULE BY MOUTH EVERY MORNING AND 1 CAPSULE DAILY AT BEDTIME 180 capsule 3   furosemide (LASIX) 40 MG tablet Take 2 tablets (80 mg total) by mouth daily. 180 tablet 3   lisinopril (ZESTRIL) 10 MG tablet Take 1 tablet (10 mg total) by mouth daily. 90 tablet 3   Lutein 20 MG TABS Take 20 mg by mouth daily with breakfast.     magnesium oxide (MAG-OX) 400 MG tablet Take 1 tablet (400 mg total) by mouth daily. 30 tablet 0   Melatonin 10 MG TABS Take 10-40 mg by mouth at bedtime as needed (sleep).     metoprolol succinate (TOPROL-XL) 50 MG 24 hr tablet Take 2 tablets (100 mg total) by mouth 3 (three) times daily. Take with or immediately following a meal. 540 tablet 3   Multiple Vitamin (MULTIVITAMIN WITH MINERALS) TABS tablet Take 1 tablet by mouth daily with breakfast.      Multiple Vitamins-Minerals (PRESERVISION AREDS 2 PO) Take 1 capsule by mouth 2 (two) times daily  with a meal.      potassium chloride (KLOR-CON) 10 MEQ tablet Take 1 tablet (10 mEq total) by mouth daily. 90 tablet 3   PRESCRIPTION MEDICATION Inhale into the lungs at bedtime. CPAP     sertraline (ZOLOFT) 50 MG tablet Take 1 tablet (50 mg total) by mouth daily. 30 tablet 2   amiodarone (PACERONE) 200 MG tablet Take 1 tablet (200 mg total) by mouth 2 (two) times daily. Then decrease dose to daily (Patient not taking: Reported on 04/29/2022) 60 tablet 0    Inpatient Medications    Scheduled Meds:  Continuous Infusions:  sodium chloride 20 mL/hr at 05/02/22 1026    PRN Meds:    ALLERGIES   Allergies  Allergen Reactions   Citalopram Hydrobromide Other (See Comments)    Other reaction(s): QT prolongation with Tikosyn   Adhesive [Tape] Swelling and Rash   Bacitracin-Polymyxin B Other (See Comments)    Other reaction(s): Unknown   Benzalkonium Chloride Rash    Pt was not aware of this allergy   Neosporin [Neomycin-Polymyxin-Gramicidin] Swelling and Rash    ROS   Pertinent items noted in HPI and remainder of comprehensive ROS otherwise negative.  Vitals   Vitals:   05/02/22 1007  BP: (!) 160/89  Pulse: 73  Resp: (!) 22  Temp: (!) 97.5 F (36.4 C)  TempSrc: Temporal  SpO2: 94%  Weight: 95.3 kg  Height: '5\' 8"'$  (1.727 m)   No intake or output data in the 24 hours ending 05/02/22 1045 Filed Weights   05/02/22 1007  Weight: 95.3 kg    Physical Exam   General appearance: alert and no distress Lungs: clear to auscultation bilaterally Heart: irregularly irregular rhythm Extremities: extremities normal, atraumatic, no cyanosis or edema Neurologic: Grossly normal  Labs   Results for orders placed or performed during the hospital encounter of 05/02/22 (from the past 48 hour(s))  I-STAT, chem 8     Status: Abnormal   Collection Time: 05/02/22 10:28 AM  Result Value Ref Range   Sodium 137 135 - 145 mmol/L   Potassium 4.2 3.5 - 5.1 mmol/L   Chloride 101 98 - 111  mmol/L   BUN 15 8 - 23 mg/dL   Creatinine, Ser 0.80 0.44 - 1.00 mg/dL   Glucose, Bld 112 (H) 70 - 99 mg/dL    Comment: Glucose reference range applies only to samples taken after fasting for at least 8 hours.   Calcium, Ion 1.23 1.15 - 1.40 mmol/L   TCO2 28 22 - 32 mmol/L   Hemoglobin 14.3 12.0 - 15.0 g/dL   HCT 42.0 36.0 - 46.0 %    ECG   N/A  Telemetry   Afib - Personally Reviewed  Radiology   No results found.  Cardiac Studies   N/A  Assessment   Active Problems:   Pacemaker-Medtronic   Persistent atrial fibrillation ([place])   Plan   Plan to proceed with DCCV today for afib. No questions regarding the procedure. Risks, benefits and alternatives reviewed.  Shared Decision Making/Informed Consent The risks (stroke, cardiac arrhythmias rarely resulting in the need for a temporary or permanent pacemaker, skin irritation or burns and complications associated with conscious sedation including aspiration, arrhythmia, respiratory failure and death), benefits (restoration of normal sinus rhythm) and alternatives of a direct current cardioversion were explained in detail to Ms. Flott and she agrees to proceed.    Time Spent Directly with Patient:  I have spent a total of 25 minutes with patient reviewing hospital notes, telemetry, EKGs, labs and examining the patient as well as establishing an assessment and plan that was discussed with the patient.  > 50% of time was spent in direct patient care.   Length of Stay:  LOS: 0 days   Pixie Casino, MD, [facility], [place] Director of the Advanced Lipid Disorders &  Cardiovascular Risk Reduction Clinic Diplomate of the American Board of Clinical Lipidology Attending Cardiologist  Direct Dial: [phone]  Fax: [phone]  Website:  [email or web address] 05/02/2022, 10:45 AM  05/02/2022, 10:45  AM

## 2022-05-02 NOTE — CV Procedure (Signed)
    CARDIOVERSION NOTE  Procedure: Electrical Cardioversion Indications:  Atrial Fibrillation  Procedure Details:  Consent: Risks of procedure as well as the alternatives and risks of each were explained to the (patient/caregiver).  Consent for procedure obtained.  Pacemaker was remotely interrogated prior to the procedure and demonstrated atrial fibrillation.  Time Out: Verified patient identification, verified procedure, site/side was marked, verified correct patient position, special equipment/implants available, medications/allergies/relevent history reviewed, required imaging and test results available.  Performed  Patient placed on cardiac monitor, pulse oximetry, supplemental oxygen as necessary.  Sedation given:  propofol per anesthesia Pacer pads placed anterior and posterior chest.  Cardioverted 3 time(s).  Cardioverted at 150J and 200J x 2 biphasic  Impression: Findings: Post procedure EKG shows: Atrial Fibrillation Complications: None Patient did tolerate procedure well.  Plan: Unsuccessful DCCV despite 3 stacked shocks (150J, 200J x 2) - there were brief periods of atrial sensed, v-paced beats, short runs of atrial tach and then ultimately the rhythm reverted to afib. Will need to discuss options with Dr. Caryl Comes.  Time Spent Directly with the Patient:  30 minutes   Pixie Casino, MD, [facility], [place] Director of the Advanced Lipid Disorders &  Cardiovascular Risk Reduction Clinic Diplomate of the American Board of Clinical Lipidology Attending Cardiologist  Direct Dial: [phone]  Fax: [phone]  Website:  [email or web address] 05/02/2022, 11:09 AM

## 2022-05-02 NOTE — Transfer of Care (Signed)
Immediate Anesthesia Transfer of Care Note  Patient: Brittany [name]  Procedure(s) Performed: CARDIOVERSION  Patient Location: PACU  Anesthesia Type:MAC  Level of Consciousness: drowsy  Airway & Oxygen Therapy: Patient Spontanous Breathing and Patient connected to nasal cannula oxygen  Post-op Assessment: Report given to RN and Post -op Vital signs reviewed and stable  Post vital signs: Reviewed and stable  Last Vitals:  Vitals Value Taken Time  BP    Temp    Pulse    Resp    SpO2      Last Pain:  Vitals:   05/02/22 1007  TempSrc: Temporal  PainSc: 0-No pain         Complications: No notable events documented.

## 2022-05-02 NOTE — Anesthesia Preprocedure Evaluation (Signed)
Anesthesia Evaluation  Patient identified by MRN, date of birth, ID band Patient awake    Reviewed: Allergy & Precautions, NPO status , Patient's Chart, lab work & pertinent test results  Airway Mallampati: II  TM Distance: >3 FB Neck ROM: Full    Dental no notable dental hx.    Pulmonary sleep apnea and Continuous Positive Airway Pressure Ventilation , former smoker,    Pulmonary exam normal breath sounds clear to auscultation       Cardiovascular hypertension, Pt. on medications and Pt. on home beta blockers + CAD and + Peripheral Vascular Disease  Normal cardiovascular exam+ dysrhythmias Atrial Fibrillation + pacemaker  Rhythm:Regular Rate:Normal     Neuro/Psych PSYCHIATRIC DISORDERS Anxiety Depression CVA, No Residual Symptoms    GI/Hepatic negative GI ROS, Neg liver ROS,   Endo/Other  negative endocrine ROS  Renal/GU negative Renal ROS     Musculoskeletal  (+) Arthritis ,   Abdominal (+) + obese,   Peds  Hematology negative hematology ROS (+)   Anesthesia Other Findings RIGHT HIP DEGENERATIVE JOINT DISEASE  Reproductive/Obstetrics                             Anesthesia Physical  Anesthesia Plan  ASA: 3  Anesthesia Plan: General   Post-op Pain Management:    Induction:   PONV Risk Score and Plan: 3 and Propofol infusion, Treatment may vary due to age or medical condition and TIVA  Airway Management Planned: Natural Airway and Nasal Cannula  Additional Equipment: None  Intra-op Plan:   Post-operative Plan:   Informed Consent: I have reviewed the patients History and Physical, chart, labs and discussed the procedure including the risks, benefits and alternatives for the proposed anesthesia with the patient or authorized representative who has indicated his/her understanding and acceptance.       Plan Discussed with: CRNA  Anesthesia Plan Comments:          Anesthesia Quick Evaluation

## 2022-05-05 ENCOUNTER — Encounter: Payer: Self-pay | Admitting: Internal Medicine

## 2022-05-07 ENCOUNTER — Encounter (HOSPITAL_COMMUNITY): Payer: Self-pay | Admitting: Internal Medicine

## 2022-05-13 NOTE — Progress Notes (Signed)
Remote pacemaker transmission.   

## 2022-05-14 ENCOUNTER — Encounter: Payer: Self-pay | Admitting: Internal Medicine

## 2022-05-14 DIAGNOSIS — G453 Amaurosis fugax: Secondary | ICD-10-CM | POA: Diagnosis not present

## 2022-05-14 DIAGNOSIS — H3589 Other specified retinal disorders: Secondary | ICD-10-CM | POA: Diagnosis not present

## 2022-05-14 DIAGNOSIS — M316 Other giant cell arteritis: Secondary | ICD-10-CM | POA: Diagnosis not present

## 2022-05-14 DIAGNOSIS — H35363 Drusen (degenerative) of macula, bilateral: Secondary | ICD-10-CM | POA: Diagnosis not present

## 2022-05-14 DIAGNOSIS — H353134 Nonexudative age-related macular degeneration, bilateral, advanced atrophic with subfoveal involvement: Secondary | ICD-10-CM | POA: Diagnosis not present

## 2022-05-15 ENCOUNTER — Telehealth: Payer: Self-pay | Admitting: Internal Medicine

## 2022-05-15 NOTE — Telephone Encounter (Signed)
Pt c/o medication issue:  1. Name of Medication: apixaban (ELIQUIS) 5 MG TABS tablet  2. How are you currently taking this medication (dosage and times per day)?   3. Are you having a reaction (difficulty breathing--STAT)?   4. What is your medication issue? Pt called requesting that Pia Mau, RN print out a prescription for eliquis so she can pick it up and mail it to her [place] pharmacy.

## 2022-05-15 NOTE — Telephone Encounter (Signed)
Pt message states she needs a printed prescription for eliquis to be picked up. Patient is 88 years old, weight-95.3kg, Crea-0.80 on 05/02/2022, Diagnosis-Afib, and last seen by Dr. Caryl Comes on 03/21/2022. Eliquis '5mg'$  dose is appropriate based on dosing criteria. Will send back to sender for an eliquis printed/written prescription.

## 2022-05-16 NOTE — Telephone Encounter (Signed)
Rx placed at front desk for Eliquis '5mg'$  - 1 tablet by mouth bid #180 with 3 RF.  Pt made aware via MyChart message in previous encounter.

## 2022-05-17 ENCOUNTER — Ambulatory Visit: Payer: Medicare Other | Attending: Internal Medicine

## 2022-05-17 ENCOUNTER — Ambulatory Visit
Admission: RE | Admit: 2022-05-17 | Discharge: 2022-05-17 | Disposition: A | Discharge status: Home / Self Care | Payer: Medicare Other | Source: Ambulatory Visit | Attending: Family Medicine | Admitting: Family Medicine

## 2022-05-17 DIAGNOSIS — I5030 Unspecified diastolic (congestive) heart failure: Secondary | ICD-10-CM

## 2022-05-17 DIAGNOSIS — Z1231 Encounter for screening mammogram for malignant neoplasm of breast: Secondary | ICD-10-CM | POA: Diagnosis not present

## 2022-05-17 DIAGNOSIS — I4819 Other persistent atrial fibrillation: Secondary | ICD-10-CM | POA: Diagnosis not present

## 2022-05-17 LAB — HEPATIC FUNCTION PANEL
ALT: 32 IU/L (ref 0–32)
AST: 26 IU/L (ref 0–40)
Albumin: 4.4 g/dL (ref 3.7–4.7)
Alkaline Phosphatase: 103 IU/L (ref 44–121)
Bilirubin Total: 0.7 mg/dL (ref 0.0–1.2)
Bilirubin, Direct: 0.2 mg/dL (ref 0.00–0.40)
Total Protein: 6.8 g/dL (ref 6.0–8.5)

## 2022-05-17 LAB — TSH: TSH: 22.2 u[IU]/mL — ABNORMAL HIGH (ref 0.450–4.500)

## 2022-05-21 ENCOUNTER — Telehealth: Payer: Self-pay

## 2022-05-21 MED ORDER — LEVOTHYROXINE SODIUM 25 MCG PO TABS: 25.0000 ug | ORAL_TABLET | Freq: Every day | ORAL | 2 refills | Status: DC

## 2022-05-21 NOTE — Telephone Encounter (Signed)
-----   Message from Deboraha Sprang, MD sent at 05/19/2022  4:16 PM EDT ----- Please Inform Patient  Labs show she is hypothyroid.   We can begin her on levothyroxine at 25 mcg and ask her to followup with her PCP   Thanks

## 2022-05-21 NOTE — Telephone Encounter (Signed)
Spoke with pt and advised pf lab results as below per Dr Caryl Comes.  Pt verbalizes understanding and is agreeable to start medication.  Pt will schedule follow up with her PCP.  Rx sent to pharmacy on file in [place].

## 2022-05-23 NOTE — Telephone Encounter (Signed)
Cool new last name-- are you used to it yet? Could you tell her that the CPX lady is having a baby as we speak, so we will not being doing these tests for a number of weeks--we can let her know when we are back and running

## 2022-05-27 DIAGNOSIS — E039 Hypothyroidism, unspecified: Secondary | ICD-10-CM | POA: Diagnosis not present

## 2022-05-28 DIAGNOSIS — Z23 Encounter for immunization: Secondary | ICD-10-CM | POA: Diagnosis not present

## 2022-05-30 ENCOUNTER — Encounter: Payer: Self-pay | Admitting: Internal Medicine

## 2022-06-03 ENCOUNTER — Ambulatory Visit (INDEPENDENT_AMBULATORY_CARE_PROVIDER_SITE_OTHER): Payer: Medicare Other

## 2022-06-03 DIAGNOSIS — I495 Sick sinus syndrome: Secondary | ICD-10-CM | POA: Diagnosis not present

## 2022-06-04 LAB — CUP PACEART REMOTE DEVICE CHECK
Battery Impedance: 6262 Ohm
Battery Remaining Longevity: 1 mo — CL
Battery Voltage: 2.62 V
Brady Statistic AP VP Percent: 9 %
Brady Statistic AP VS Percent: 1 %
Brady Statistic AS VP Percent: 51 %
Brady Statistic AS VS Percent: 40 %
Date Time Interrogation Session: 20231113144846
Implantable Lead Connection Status: 753985
Implantable Lead Connection Status: 753985
Implantable Lead Implant Date: 20121213
Implantable Lead Implant Date: 20121213
Implantable Lead Location: 753859
Implantable Lead Location: 753860
Implantable Lead Model: 4076
Implantable Lead Model: 4076
Implantable Lead Serial Number: 835025
Implantable Lead Serial Number: 869269
Implantable Pulse Generator Implant Date: 20121213
Lead Channel Impedance Value: 392 Ohm
Lead Channel Impedance Value: 491 Ohm
Lead Channel Pacing Threshold Amplitude: 0.625 V
Lead Channel Pacing Threshold Amplitude: 1.375 V
Lead Channel Pacing Threshold Pulse Width: 0.4 ms
Lead Channel Pacing Threshold Pulse Width: 0.4 ms
Lead Channel Setting Pacing Amplitude: 2 V
Lead Channel Setting Pacing Amplitude: 2.75 V
Lead Channel Setting Pacing Pulse Width: 0.46 ms
Lead Channel Setting Sensing Sensitivity: 2.8 mV
Zone Setting Status: 755011
Zone Setting Status: 755011

## 2022-06-04 NOTE — Progress Notes (Unsigned)
Cardiology Office Note Date:  06/04/2022  Patient ID:  Brittany [name], DOB [birth year], MRN [id number] PCP:  Donald Prose, MD  Electrophysiologist: Dr. Caryl Comes    Chief Complaint:  planned f/u  History of Present Illness: Brittany [name] is a 88 y.o. female with history of stroke, SSSx w/PPM, Afib, ATach, HTN, HLD, hypothyroidism, OSA w/CPAP  07/30/21 Hgb 13.4 > HIP SURGERY  on 1/24 > 1/25 8.1, 9.0 > 08/22/21 8.8   She comes in today to be seen for Dr. Caryl Comes, last seen by him 08/23/21, she had recently seen pulmonary with symptoms /signs of volume OL and started on furosemide. C/o palpitations, DOE as well as orthopedic pains He was unconvinced of HFpEF, though recommended to give the lasix a try for a week.  Also noted persistent HR 80's, suspect AT and her metoprolol increased to '150mg'$  BID, her ACE stopped Tikosyn continued  Saw pulm team 08/29/21, SOB better but not resolved, edema was resolved.  She was off lasix (at Dr. Olin Pia recommendation), suspected volume did play a roll though as well as deconditioning after orthopedic surgeries. Trial of prn albuterol   Pt called/my chart messages with concerns of lab results and ongoing SOB, started on torsemide '20mg'$  daily for 5 days and repeat BNP   BNP 3187 > 2448  I saw her 09/26/21 She is accompanied by her husband today. She reports that last year after her knee surgery she noticed a little SOB, but not much and tolerable, after her hip surgery though she has been markedly SOB with minimal exertion. The albuterol did not seem to help The diuretics perhaps though minimally. No CP, no palpitations or cardiac awareness No near syncope or syncope. She has had some longer episodes of Afib of late, she was unaware of them. Her DOE has been unchanged and persistent sione her hip surgery in January, she has not noticed specific days that she is SOB over others.  Suspected SOB multifactorial with a clear CXR, though elevated BNP. Increased AF  burden from her baseline felt triggered by recent surgery and anemia. Planned to check labs, if Hgb was better resume diuretic  Labs were OK, H/H better, BNP elevated and started on furosemide daily with plans for repeat labs  She saw Dr. Halford Chessman 11/06/21, not felt to have any obvious lung disease, and SOB likely CHF, anemia, deconditioning, recommended regular exercise  I saw her 12/21/21 She is accompanied by her husband She thinks she may be starting to feel a little less SOB when ambulating. Whe she is walking with her walker she can walk without SOB or difficulty, and can walk as far as she wants/needs to, but when walking under her own power she gets winded. No rest SOB No CP She denies symptoms of palpitations, but thinks she has had AFib, because when at her MD visit last week they told her she was out of rhythm, she could not tell. No near syncope or syncope. No bleeding. PMD did labs, 12/05/21 her PTH/calcium was elevated and is being referred to a endocrinologist, she recalls this being mentioned years ago by her MD in [place], there was some discussion about surgery but she did not want that. H/H 13/39 BUN/Creat 15/0.67 K+ 4.7  Unclear etiology of her SOB, is more persistent the her AFib is, planned for a stress test evaluate further. Stress test was abnormal with a small area of ischemia apical to mid inferior wall, and felt a low risk study given small territory.  I reviewed with general cardiologist, agreed, given small territory would not pursue cath but try nitrate 1st/manage medically.  Not certain that this defect would cause SOB she describes.  Pt subsequently called with new onset diarrhea with the start of Imdur and advised to stop it and hydrate, follow.   I called 02/12/22 and spoke to the pt, seems the diarrhea is once but every day and only at night, continues, is thin/watery.  No blood.   She had not reached out to PMD and was advised to.  Was planning on trying  immodium, she has tried pepto without relief. She was advised to reach out to her PMD    Saw her 02/13/22 She took the imodium last night and did not have loose stool, feels like her belly is better today. Discussed low likely hood of the Imdur being the etiology, but not impossible. She otherwise remains the same. No erst SOB No orthopnea/PND No SOB when walking with the walker Gets winded with walking under hew own power No near syncope or syncope. She is not convinced the furosemide helps No bleeding D/w Dr. Caryl Comes She would like to avoid cath, plan for coronary CTa Consider Farxiga or jardiance if able once diarrhea is resolved, if CT does not explain her SOB  Unclear, looks like unable to pursue coronary CT 2/2 her Afib and elevated HRs at the time. Planned to revisit coronary CT if able to get her Afib better managed and/or rate controlled  Started on Farxiga  She saw Dr. Caryl Comes 03/21/22, also despite increasing Afib burden, did not seem to align with or explain her day-in and day-out SOB. Tikosyn stopped > amiodarone Furosemide increased  Numerous pt encounters via calls/messages TSH became elevated and planned for her PMD to manage, with close f/u on amio +/- need for DCCV   06/02/22: DCCV had 3 shocks with brief periods of sinus only  Most recently patient messages about R eye issues, saw her eye MD that felt noyt associated with her macular degeneration but a vascular issue Multiple messages back/forth including Dr. Caryl Comes and discussion with neurology. "visual migraine or local retinal pathology. TIAs unlikely give exactly same vascular distribution and very brief duration."  Advised to f/u with   Advised CPX testing to better understand her SOB, though exercise physiologist on maternity leave.  Pt reached out wondering if she may have amyloid....  TODAY She c/w the same DOE. No rest SOB, no nocturnal or supine SOB Exertional capacity remains minimal under her own  power, is able to walk further/longer if using a walker, grocery cart. No CP, no palpitations or cardiac awareness No near syncope or syncope. She will feel a little dizzy when standing up from bending over. No bleeding or signs of bleeding   Device information MDT dual chamber PPM implanted 07/04/11  AFib Hx Diagnosis goes as far back as 2-14 or earlier  AAD hx Sotalol started perhaps 2014 or earlier stopped with recurrent Afib Tikosyn started Sep 2020 > stopped with increasing AFib burden Amiodarone started Aug 2023  Past Medical History:  Diagnosis Date   Ankle fracture 2016   Arthritis    Atrial fibrillation (HCC)    Depression    Dysrhythmia    Elevated parathyroid hormone    Fracture of orbital floor with routine healing    GERD (gastroesophageal reflux disease)    History of colon polyps    benign   Hypercalcemia    Hypertension    Hyponatremia  Hypothyroid    Macular degeneration    wet in the right and dry in the left   OSA on CPAP    Osteoporosis    Peripheral vascular disease (HCC)    Presence of permanent cardiac pacemaker    PSVT (paroxysmal supraventricular tachycardia)    Rotator cuff tear    Sinus node dysfunction (HCC)    a. s/p MDT pacemaker   Stroke [facility])    Urinary incontinence    Wrist fracture 2018    Past Surgical History:  Procedure Laterality Date   BACK SURGERY     CARDIOVERSION N/A 05/02/2022   Procedure: CARDIOVERSION;  Surgeon: Pixie Casino, MD;  Location: [facility];  Service: Cardiovascular;  Laterality: N/A;   CATARACT EXTRACTION     CATARACT EXTRACTION W/ INTRAOCULAR LENS  IMPLANT, BILATERAL Bilateral    COLONOSCOPY     ESOPHAGOGASTRODUODENOSCOPY     IR ANGIO INTRA EXTRACRAN SEL COM CAROTID INNOMINATE BILAT MOD SED  12/30/2017   IR ANGIO VERTEBRAL SEL VERTEBRAL BILAT MOD SED  12/30/2017   LAPAROSCOPIC CHOLECYSTECTOMY  08/11/1999   LUMBAR [place] SURGERY  02/19/2014   ORIF ANKLE FRACTURE Left 04/13/2015   Procedure:  OPEN REDUCTION INTERNAL FIXATION (ORIF) LEFT ANKLE FRACTURE;  Surgeon: Meredith Pel, MD;  Location: [place];  Service: Orthopedics;  Laterality: Left;   PACEMAKER PLACEMENT Right 07/22/2010   a. MDT dual chamber PPM implanted by Dr Caryl Comes    SHOULDER ARTHROSCOPY W/ ROTATOR CUFF REPAIR Right 08/30/2014   WITH MINI-OPEN ROTATOR CUFF REPAIR AND SUBACROMIAL DECOMPRESSION   SHOULDER ARTHROSCOPY WITH ROTATOR CUFF REPAIR AND SUBACROMIAL DECOMPRESSION Right 08/30/2014   Procedure: SHOULDER ARTHROSCOPY WITH MINI-OPEN ROTATOR CUFF REPAIR AND SUBACROMIAL DECOMPRESSION;  Surgeon: Meredith Pel, MD;  Location: [place];  Service: Orthopedics;  Laterality: Right;  RIGHT SHOULDER DIAGNOSTIC OPERATIVE ARTHROSCOPY, SUBACROMIAL DECOMPRESSION, MINI-OPEN ROTATOR CUFF REPAIR.   TOTAL HIP ARTHROPLASTY Right 08/14/2021   Procedure: TOTAL HIP ARTHROPLASTY ANTERIOR APPROACH;  Surgeon: Melrose Nakayama, MD;  Location: [facility];  Service: Orthopedics;  Laterality: Right;   TOTAL KNEE ARTHROPLASTY Left 02/13/2021   Procedure: LEFT TOTAL KNEE ARTHROPLASTY;  Surgeon: Melrose Nakayama, MD;  Location: [facility];  Service: Orthopedics;  Laterality: Left;   VAGINAL HYSTERECTOMY  07/22/1968   WRIST FRACTURE SURGERY Left     Current Outpatient Medications  Medication Sig Dispense Refill   acetaminophen (TYLENOL) 500 MG tablet Take 500 mg by mouth 2 (two) times daily.     alendronate (FOSAMAX) 70 MG tablet Take 1 tablet (70 mg total) by mouth every 7 (seven) days. Take with a full glass of water on an empty stomach. 4 tablet 11   amiodarone (PACERONE) 200 MG tablet Take 1 tablet (200 mg total) by mouth 2 (two) times daily. Then decrease dose to daily (Patient not taking: Reported on 04/29/2022) 60 tablet 0   amiodarone (PACERONE) 200 MG tablet Take 1 tablet (200 mg total) by mouth daily. 90 tablet 3   apixaban (ELIQUIS) 5 MG TABS tablet Take 1 tablet (5 mg total) by mouth 2 (two) times daily. 180 tablet 3   atorvastatin (LIPITOR) 40 MG  tablet Take 40 mg by mouth daily.     B Complex-C (B-COMPLEX WITH VITAMIN C) tablet Take 1 tablet by mouth daily with lunch.      Cholecalciferol (VITAMIN D3) 125 MCG (5000 UT) TABS Take 10,000 Units by mouth daily with breakfast.     dapagliflozin propanediol (FARXIGA) 10 MG TABS tablet Take 1 tablet (10 mg total)  by mouth daily before breakfast. 90 tablet 3   diltiazem (CARTIA XT) 120 MG 24 hr capsule TAKE 1 CAPSULE BY MOUTH EVERY MORNING AND 1 CAPSULE DAILY AT BEDTIME 180 capsule 3   furosemide (LASIX) 40 MG tablet Take 2 tablets (80 mg total) by mouth daily. 180 tablet 3   levothyroxine (SYNTHROID) 25 MCG tablet Take 1 tablet (25 mcg total) by mouth daily before breakfast. 30 tablet 2   lisinopril (ZESTRIL) 10 MG tablet Take 1 tablet (10 mg total) by mouth daily. 90 tablet 3   Lutein 20 MG TABS Take 20 mg by mouth daily with breakfast.     magnesium oxide (MAG-OX) 400 MG tablet Take 1 tablet (400 mg total) by mouth daily. 30 tablet 0   Melatonin 10 MG TABS Take 10-40 mg by mouth at bedtime as needed (sleep).     metoprolol succinate (TOPROL-XL) 50 MG 24 hr tablet Take 2 tablets (100 mg total) by mouth 3 (three) times daily. Take with or immediately following a meal. 540 tablet 3   Multiple Vitamin (MULTIVITAMIN WITH MINERALS) TABS tablet Take 1 tablet by mouth daily with breakfast.      Multiple Vitamins-Minerals (PRESERVISION AREDS 2 PO) Take 1 capsule by mouth 2 (two) times daily with a meal.      potassium chloride (KLOR-CON) 10 MEQ tablet Take 1 tablet (10 mEq total) by mouth daily. 90 tablet 3   sertraline (ZOLOFT) 50 MG tablet Take 1 tablet (50 mg total) by mouth daily. 30 tablet 2   No current facility-administered medications for this visit.    Allergies:   Citalopram hydrobromide, Adhesive [tape], Bacitracin-polymyxin b, Benzalkonium chloride, and Neosporin [neomycin-polymyxin-gramicidin]   Social History:  The patient  reports that she quit smoking about 31 years ago. Her smoking  use included cigarettes. She has a 45.00 pack-year smoking history. She has never used smokeless tobacco. She reports current alcohol use of about 28.0 standard drinks of alcohol per week. She reports that she does not use drugs.   Family History:  The patient's family history includes Asthma in her brother; Breast cancer in her paternal aunt; Cancer in her father; Colon cancer in her mother; Heart disease in her brother; Skin cancer in her brother; Stroke in her sister.  ROS:  Please see the history of present illness.    All other systems are reviewed and otherwise negative.   PHYSICAL EXAM:  VS:  LMP 07/22/1968  BMI: There is no height or weight on file to calculate BMI. Well nourished, well developed, in no acute distress HEENT: normocephalic, atraumatic Neck: no JVD, carotid bruits or masses Cardiac: RRR; no significant murmurs, no rubs, or gallops Lungs: CTA b/l, no wheezing, rhonchi or rales Abd: soft, nontender MS: no deformity or atrophy Ext: no edema Skin: warm and dry, no rash Neuro:  No gross deficits appreciated Psych: euthymic mood, full affect  PPM site is stable, no tethering or discomfort   EKG:   not done today  Device interrogation done today and reviewed by myself:  Battery is <1 mo to ERI Lead measurements are stable 99% Afib burden since Aug   02/06/22: stress myoview   Findings are consistent with ischemia. The study is low risk.   No ST deviation was noted.   LV perfusion is abnormal. There is evidence of ischemia. Defect 1: There is a small defect with mild reduction in uptake present in the apical to mid inferior location(s) that is reversible. There is normal wall motion in  the defect area. Consistent with ischemia.   Left ventricular function is normal. Nuclear stress EF: 69 %. The left ventricular ejection fraction is hyperdynamic (>65%). End diastolic cavity size is normal. End systolic cavity size is normal.   Prior study available for comparison  from 03/21/2021.   Reversible perfusion defect in apical to mid inferior wall consistent with ischemia Low risk study.  Area of ischemia is small   08/10/21: TTE 1. Left ventricular ejection fraction, by estimation, is 55 to 60%. The  left ventricle has normal function. The left ventricle has no regional  wall motion abnormalities. There is mild asymmetric left ventricular  hypertrophy of the basal and septal  segments. Left ventricular diastolic parameters are consistent with Grade  I diastolic dysfunction (impaired relaxation). The average left  ventricular global longitudinal strain is -20.1 %. The global longitudinal  strain is normal.   2. Pacing wires in RA/RV . Right ventricular systolic function is normal.  The right ventricular size is normal. There is moderately elevated  pulmonary artery systolic pressure.   3. Left atrial size was moderately dilated.   4. Right atrial size was moderately dilated.   5. The mitral valve is degenerative. Mild to moderate mitral valve  regurgitation. No evidence of mitral stenosis.   6. Tricuspid valve regurgitation is mild to moderate.   7. The aortic valve is tricuspid. There is moderate calcification of the  aortic valve. There is moderate thickening of the aortic valve. Aortic  valve regurgitation is mild. Aortic valve sclerosis/calcification is  present, without any evidence of aortic  stenosis.   8. Aortic dilatation noted. There is mild dilatation of the ascending  aorta, measuring 38 mm.   9. The inferior vena cava is normal in size with greater than 50%  respiratory variability, suggesting right atrial pressure of 3 mmHg.   Comparison(s): 12/31/17 EF 55-60%.    Recent Labs: 09/26/2021: Platelets 282 02/13/2022: Magnesium 1.9 04/05/2022: NT-Pro BNP 1,211 05/02/2022: BUN 15; Creatinine, Ser 0.80; Hemoglobin 14.3; Potassium 4.2; Sodium 137 05/17/2022: ALT 32; TSH 22.200  No results found for requested labs within last 365 days.    CrCl cannot be calculated (Patient's most recent lab result is older than the maximum 21 days allowed.).   Wt Readings from Last 3 Encounters:  05/02/22 210 lb (95.3 kg)  03/21/22 210 lb (95.3 kg)  02/13/22 210 lb 3.2 oz (95.3 kg)     Other studies reviewed: Additional studies/records reviewed today include: summarized above  ASSESSMENT AND PLAN:  PPM Intact function No programming changes made nearing ERI  Paroxysmal AFib ATach CHA2DS2Vasc is 6, on Eliquis, appropriately dosed  She had ERAF after only some seconds after ech shock Discussed stopping amiodarone to pursue rate control strategy I do not think her Afib is her SOB, becoming persistent only in the last few months, her DOE has been ongoing for 8 mo or so  Discussed perhaps retrying DCCV after being on amiodarone a little longer  Hypothyroid, perhaps 2/2 amio, lower dose now, perhaps will help this. Her PMD is following her thyroid   HTN Looks OK   SOB Likely multifactorial Unclear if her increased AF burden is the cause of her SOB, though don't think so. She can walk without or at least with less SOB using her walker, but gets SOB when walking un-aided perhaps 50 feet or so This is not escalating or changing, but not getting better No pulmonary process by her w/u with pulm Exam does not appear volume  OL Stress test with small ischemic defect Intolerant of Imdur with diarrhea No CP Continue furosemide, farxiga, ? HFpEF AFib will not allow CTa coronaries Planned for CPX testing    Disposition: will have her back in 2 mo, pending CPX findings, decide on rate vs rhythm control strategy, will need to interrupt her [place] for gen change, this is likely a few months down the road.    Current medicines are reviewed at length with the patient today.  The patient did not have any concerns regarding medicines.  Venetia Night, PA-C 06/04/2022 9:05 AM     [facility] [address] [place] [address] [phone] (office)  [phone] (fax)

## 2022-06-05 ENCOUNTER — Encounter: Payer: Self-pay | Admitting: Physician Assistant

## 2022-06-05 ENCOUNTER — Ambulatory Visit: Payer: Medicare Other | Attending: Physician Assistant | Admitting: Physician Assistant

## 2022-06-05 VITALS — BP 128/80 | HR 96 | Ht 68.0 in | Wt 218.0 lb

## 2022-06-05 DIAGNOSIS — Z950 Presence of cardiac pacemaker: Secondary | ICD-10-CM

## 2022-06-05 DIAGNOSIS — I10 Essential (primary) hypertension: Secondary | ICD-10-CM | POA: Insufficient documentation

## 2022-06-05 DIAGNOSIS — I4819 Other persistent atrial fibrillation: Secondary | ICD-10-CM | POA: Diagnosis not present

## 2022-06-05 DIAGNOSIS — R0602 Shortness of breath: Secondary | ICD-10-CM

## 2022-06-05 LAB — CUP PACEART INCLINIC DEVICE CHECK
Battery Impedance: 6251 Ohm
Battery Remaining Longevity: 1 mo — CL
Battery Voltage: 2.61 V
Brady Statistic AP VP Percent: 9 %
Brady Statistic AP VS Percent: 1 %
Brady Statistic AS VP Percent: 51 %
Brady Statistic AS VS Percent: 39 %
Date Time Interrogation Session: 20231115141521
Implantable Lead Connection Status: 753985
Implantable Lead Connection Status: 753985
Implantable Lead Implant Date: 20121213
Implantable Lead Implant Date: 20121213
Implantable Lead Location: 753859
Implantable Lead Location: 753860
Implantable Lead Model: 4076
Implantable Lead Model: 4076
Implantable Lead Serial Number: 835025
Implantable Lead Serial Number: 869269
Implantable Pulse Generator Implant Date: 20121213
Lead Channel Impedance Value: 386 Ohm
Lead Channel Impedance Value: 499 Ohm
Lead Channel Pacing Threshold Amplitude: 0.625 V
Lead Channel Pacing Threshold Amplitude: 1 V
Lead Channel Pacing Threshold Amplitude: 1.25 V
Lead Channel Pacing Threshold Pulse Width: 0.4 ms
Lead Channel Pacing Threshold Pulse Width: 0.4 ms
Lead Channel Pacing Threshold Pulse Width: 0.46 ms
Lead Channel Sensing Intrinsic Amplitude: 0.7 mV
Lead Channel Sensing Intrinsic Amplitude: 11.2 mV
Lead Channel Setting Pacing Amplitude: 2 V
Lead Channel Setting Pacing Amplitude: 2.75 V
Lead Channel Setting Pacing Pulse Width: 0.46 ms
Lead Channel Setting Sensing Sensitivity: 2.8 mV
Zone Setting Status: 755011
Zone Setting Status: 755011

## 2022-06-05 NOTE — Patient Instructions (Signed)
Medication Instructions:   Your physician recommends that you continue on your current medications as directed. Please refer to the Current Medication list given to you today.   *If you need a refill on your cardiac medications before your next appointment, please call your pharmacy*   Lab Work: [place]    If you have labs (blood work) drawn today and your tests are completely normal, you will receive your results only by: [place] (if you have MyChart) OR A paper copy in the mail If you have any lab test that is abnormal or we need to change your treatment, we will call you to review the results.   Testing/Procedures: Your physician has recommended that you have a cardiopulmonary stress test (CPX). CPX testing is a non-invasive measurement of heart and lung function. It replaces a traditional treadmill stress test. This type of test provides a tremendous amount of information that relates not only to your present condition but also for future outcomes. This test combines measurements of you ventilation, respiratory gas exchange in the lungs, electrocardiogram (EKG), blood pressure and physical response before, during, and following an exercise protocol.      Follow-Up: At [facility], you and your health needs are our priority.  As part of our continuing mission to provide you with exceptional heart care, we have created designated Provider Care Teams.  These Care Teams include your primary Cardiologist (physician) and Advanced Practice Providers (APPs -  Physician Assistants and Nurse Practitioners) who all work together to provide you with the care you need, when you need it.  We recommend signing up for the patient portal called "MyChart".  Sign up information is provided on this After Visit Summary.  MyChart is used to connect with patients for Virtual Visits (Telemedicine).  Patients are able to view lab/test results, encounter notes, upcoming  appointments, etc.  Non-urgent messages can be sent to your provider as well.   To learn more about what you can do with MyChart, go to [email or web address].    Your next appointment:   2 month(s)  The format for your next appointment:   In Person  Provider:   You may see Virl Axe, MD or one of the following Advanced Practice Providers on your designated Care Team:   Tommye Standard, [place]  Other Instructions  Important Information About [place]

## 2022-06-07 DIAGNOSIS — D6869 Other thrombophilia: Secondary | ICD-10-CM | POA: Diagnosis not present

## 2022-06-07 DIAGNOSIS — I10 Essential (primary) hypertension: Secondary | ICD-10-CM | POA: Diagnosis not present

## 2022-06-07 DIAGNOSIS — I4891 Unspecified atrial fibrillation: Secondary | ICD-10-CM | POA: Diagnosis not present

## 2022-06-07 DIAGNOSIS — E559 Vitamin D deficiency, unspecified: Secondary | ICD-10-CM | POA: Diagnosis not present

## 2022-06-07 DIAGNOSIS — E785 Hyperlipidemia, unspecified: Secondary | ICD-10-CM | POA: Diagnosis not present

## 2022-06-07 DIAGNOSIS — F411 Generalized anxiety disorder: Secondary | ICD-10-CM | POA: Diagnosis not present

## 2022-06-07 DIAGNOSIS — E039 Hypothyroidism, unspecified: Secondary | ICD-10-CM | POA: Diagnosis not present

## 2022-06-21 ENCOUNTER — Ambulatory Visit
Admission: RE | Admit: 2022-06-21 | Discharge: 2022-06-21 | Disposition: A | Discharge status: Home / Self Care | Payer: Medicare Other | Source: Ambulatory Visit | Attending: Family Medicine | Admitting: Family Medicine

## 2022-06-21 DIAGNOSIS — Z780 Asymptomatic menopausal state: Secondary | ICD-10-CM | POA: Diagnosis not present

## 2022-06-21 DIAGNOSIS — M810 Age-related osteoporosis without current pathological fracture: Secondary | ICD-10-CM | POA: Diagnosis not present

## 2022-06-24 DIAGNOSIS — B078 Other viral warts: Secondary | ICD-10-CM | POA: Diagnosis not present

## 2022-06-24 DIAGNOSIS — X32XXXD Exposure to sunlight, subsequent encounter: Secondary | ICD-10-CM | POA: Diagnosis not present

## 2022-06-24 DIAGNOSIS — L820 Inflamed seborrheic keratosis: Secondary | ICD-10-CM | POA: Diagnosis not present

## 2022-06-24 DIAGNOSIS — L570 Actinic keratosis: Secondary | ICD-10-CM | POA: Diagnosis not present

## 2022-06-26 DIAGNOSIS — M1711 Unilateral primary osteoarthritis, right knee: Secondary | ICD-10-CM | POA: Diagnosis not present

## 2022-07-04 ENCOUNTER — Ambulatory Visit (INDEPENDENT_AMBULATORY_CARE_PROVIDER_SITE_OTHER): Payer: Medicare Other

## 2022-07-04 DIAGNOSIS — I495 Sick sinus syndrome: Secondary | ICD-10-CM

## 2022-07-05 DIAGNOSIS — E559 Vitamin D deficiency, unspecified: Secondary | ICD-10-CM | POA: Diagnosis not present

## 2022-07-05 DIAGNOSIS — I10 Essential (primary) hypertension: Secondary | ICD-10-CM | POA: Diagnosis not present

## 2022-07-05 DIAGNOSIS — E785 Hyperlipidemia, unspecified: Secondary | ICD-10-CM | POA: Diagnosis not present

## 2022-07-05 DIAGNOSIS — E039 Hypothyroidism, unspecified: Secondary | ICD-10-CM | POA: Diagnosis not present

## 2022-07-05 LAB — CUP PACEART REMOTE DEVICE CHECK
Battery Impedance: 7004 Ohm
Battery Remaining Longevity: 1 mo — CL
Battery Voltage: 2.59 V
Date Time Interrogation Session: 20231214154931
Implantable Lead Connection Status: 753985
Implantable Lead Connection Status: 753985
Implantable Lead Implant Date: 20121213
Implantable Lead Implant Date: 20121213
Implantable Lead Location: 753859
Implantable Lead Location: 753860
Implantable Lead Model: 4076
Implantable Lead Model: 4076
Implantable Lead Serial Number: 835025
Implantable Lead Serial Number: 869269
Implantable Pulse Generator Implant Date: 20121213
Lead Channel Impedance Value: 376 Ohm
Lead Channel Impedance Value: 491 Ohm
Lead Channel Pacing Threshold Amplitude: 0.625 V
Lead Channel Pacing Threshold Amplitude: 1.125 V
Lead Channel Pacing Threshold Pulse Width: 0.4 ms
Lead Channel Pacing Threshold Pulse Width: 0.4 ms
Lead Channel Setting Pacing Amplitude: 2 V
Lead Channel Setting Pacing Amplitude: 2.5 V
Lead Channel Setting Pacing Pulse Width: 0.46 ms
Lead Channel Setting Sensing Sensitivity: 2.8 mV
Zone Setting Status: 755011
Zone Setting Status: 755011

## 2022-07-10 DIAGNOSIS — H9192 Unspecified hearing loss, left ear: Secondary | ICD-10-CM | POA: Diagnosis not present

## 2022-07-10 DIAGNOSIS — H6122 Impacted cerumen, left ear: Secondary | ICD-10-CM | POA: Diagnosis not present

## 2022-07-16 NOTE — Progress Notes (Signed)
Remote pacemaker transmission.   

## 2022-07-18 ENCOUNTER — Telehealth: Payer: Self-pay | Admitting: Internal Medicine

## 2022-07-18 NOTE — Telephone Encounter (Signed)
Pt c/o medication issue:  1. Name of Medication:   amiodarone (PACERONE) 200 MG tablet (Expired) Take 1 tablet (200 mg total) by mouth 2 (two) times daily. Then decrease dose to dailyPatient not taking: Reported on 04/29/2022   amiodarone (PACERONE) 200 MG tablet Take 1 tablet (200 mg total) by mouth daily.    2. How are you currently taking this medication (dosage and times per day)?   3. Are you having a reaction (difficulty breathing--STAT)?  4. What is your medication issue? Pt calling because she is not sure whether she should continue taking this medication or stop. She states she was told to not take too much of it, but she is running out soon. Please advise.

## 2022-07-18 NOTE — Telephone Encounter (Signed)
Patient called to find out if she is supposed to continue amiodarone 200 mg daily. Reviewed with Dr Caryl Comes, advised patient to continue taking and they will discuss at her next appointment on 08/12/22. Patient verbalized understanding.

## 2022-07-26 NOTE — Progress Notes (Signed)
Remote pacemaker transmission.   

## 2022-07-29 DIAGNOSIS — H6121 Impacted cerumen, right ear: Secondary | ICD-10-CM | POA: Diagnosis not present

## 2022-08-05 ENCOUNTER — Ambulatory Visit (INDEPENDENT_AMBULATORY_CARE_PROVIDER_SITE_OTHER): Payer: Medicare Other

## 2022-08-05 DIAGNOSIS — I495 Sick sinus syndrome: Secondary | ICD-10-CM

## 2022-08-07 ENCOUNTER — Telehealth: Payer: Self-pay

## 2022-08-07 ENCOUNTER — Telehealth: Payer: Self-pay | Admitting: Internal Medicine

## 2022-08-07 LAB — CUP PACEART REMOTE DEVICE CHECK
Battery Impedance: 8324 Ohm
Battery Voltage: 2.61 V
Brady Statistic RV Percent Paced: 75 %
Date Time Interrogation Session: 20240115091023
Implantable Lead Connection Status: 753985
Implantable Lead Connection Status: 753985
Implantable Lead Implant Date: 20121213
Implantable Lead Implant Date: 20121213
Implantable Lead Location: 753859
Implantable Lead Location: 753860
Implantable Lead Model: 4076
Implantable Lead Model: 4076
Implantable Lead Serial Number: 835025
Implantable Lead Serial Number: 869269
Implantable Pulse Generator Implant Date: 20121213
Lead Channel Impedance Value: 493 Ohm
Lead Channel Impedance Value: 67 Ohm
Lead Channel Setting Pacing Amplitude: 2.5 V
Lead Channel Setting Pacing Pulse Width: 0.4 ms
Lead Channel Setting Sensing Sensitivity: 2.8 mV
Zone Setting Status: 755011
Zone Setting Status: 755011

## 2022-08-07 NOTE — Telephone Encounter (Signed)
Returned call to patient.  Patient is asking if she needs to continue taking the amiodarone '200mg'$  daily. She has enough of her current prescription to take until 08/19/22. She has a F/U appt with Dr. Caryl Comes on 08/21/22.  Patient is unsure if she should refill the amiodarone as she might be taken off of it.  She would like to know if she needs to go ahead and refill her Rx or just take until 08/19/22 and stop.  Advised patient that Dr. Caryl Comes and his nurse are out of the office, Dr. Caryl Comes is on vacation until 08/12/22.  Will forward to Dr. Caryl Comes and his nurse to review and advise.  Patient verbalized understanding and expressed appreciation for assistance today.

## 2022-08-07 NOTE — Telephone Encounter (Signed)
Pt c/o medication issue:  1. Name of Medication:   amiodarone (PACERONE) 200 MG tablet (Expired)    2. How are you currently taking this medication (dosage and times per day)?   Take 1 tablet (200 mg total) by mouth 2 (two) times daily. Then decrease dose to dailyPatient not taking: Reported on 04/29/2022    3. Are you having a reaction (difficulty breathing--STAT)? no  4. What is your medication issue? Calling to see if she is suppose to continue with the medication. Please advise

## 2022-08-07 NOTE — Telephone Encounter (Signed)
Informed patient that her device has reached ERI and to make sure she come to the appointment scheduled for 08/21/2022

## 2022-08-08 NOTE — Telephone Encounter (Signed)
M  GM2U  her device has also reached ERI  She has been on amio with hopes to sustain sinus rhythm and possibly will need DCCV     Can we pencil her for PM gen change as close to her 1/31 appt

## 2022-08-08 NOTE — Telephone Encounter (Signed)
Spoke with pt and advised she will need to continue Amiodarone '200mg'$  daily until she sees Dr Caryl Comes on 08/21/2022.  Pt reports she has been doing well other than SOB and some chest heaviness on occasion at night.  Pt states she was started on Imdur but developed diarrhea and she felt it was due to the medication.  Pt states she has no additional symptoms with the chest heaviness when this occurs and it may last for just a few minutes.  She was unable to complete previously ordered cardiac CT due to Afib and HR.  Pt's PCP is managing her thyroid labs and medication.  Reviewed ED precautions with pt.  She verbalizes understanding and agrees with current plan.

## 2022-08-12 ENCOUNTER — Ambulatory Visit: Payer: Medicare Other | Admitting: Internal Medicine

## 2022-08-21 ENCOUNTER — Encounter: Payer: Self-pay | Admitting: Internal Medicine

## 2022-08-21 ENCOUNTER — Ambulatory Visit: Payer: Medicare Other | Attending: Internal Medicine | Admitting: Internal Medicine

## 2022-08-21 VITALS — BP 152/76 | HR 64 | Ht 68.0 in | Wt 218.0 lb

## 2022-08-21 DIAGNOSIS — Z01812 Encounter for preprocedural laboratory examination: Secondary | ICD-10-CM

## 2022-08-21 DIAGNOSIS — I495 Sick sinus syndrome: Secondary | ICD-10-CM | POA: Diagnosis not present

## 2022-08-21 DIAGNOSIS — Z950 Presence of cardiac pacemaker: Secondary | ICD-10-CM | POA: Diagnosis not present

## 2022-08-21 DIAGNOSIS — I4719 Other supraventricular tachycardia: Secondary | ICD-10-CM

## 2022-08-21 DIAGNOSIS — I480 Paroxysmal atrial fibrillation: Secondary | ICD-10-CM | POA: Diagnosis not present

## 2022-08-21 DIAGNOSIS — Z79899 Other long term (current) drug therapy: Secondary | ICD-10-CM | POA: Diagnosis not present

## 2022-08-21 NOTE — Progress Notes (Signed)
Patient Care Team: Donald Prose, MD as PCP - General (Family Medicine) Deboraha Sprang, MD as PCP - Electrophysiology (Cardiology) Deboraha Sprang, MD as PCP - Cardiology (Cardiology) Bjorn Loser, MD as Consulting Physician (Urology) Deboraha Sprang, MD as Consulting Physician (Cardiology) Chesley Mires, MD as Consulting Physician (Pulmonary Disease) Melrose Nakayama, MD as Consulting Physician (Orthopedic Surgery) Antionette Fairy Isaias Cowman, MD as Referring Physician (Ophthalmology) Paulla Dolly Tamala Fothergill, DPM as Consulting Physician (Podiatry)   HPI  Brittany [name] is a 89 y.o. female Seen in followup for  pacer Medtronic implanted 12/12 for sinus node dysfunction.  She has atrial fibrillation for which she previously took sotalol try dofetilide which was a failure and then started amiodarone with anticipation of cardioversion..  This was done 10/23 and she failed to hold-- anticoagulation with apixaban  She will be undergoing a R knee arthroplasty 08/14/21. She had her L knee replaced 02/13/21. She endorses a history of stroke occurring 2 to 3 years ago and was put on Eliquis. During her previous surgery, she stopped the Eliquis 3 days precluding spinal  Of note, she also hope to replace her R hip because of pain and bone degeneration. Following her surgery, she developed what has been a relatively long-term issue with dyspnea which has been somewhat perplexing.  Seen by pulm  concerns re fluid overload and started on furosemide >> their evaluation was with clear lungs unlikely to be pulmonary  There has been some increased atrial fibrillation burden BNPs have been elevated but has not improved with either diuretics or inhalers.  She has seen RU-AP a number of times with continued complaints.  It has been noted that she can walk with her walker without shortness of breath but when ambulating without becomes quite short of breath.  Her device is reached ERI.  She has had no clear symptoms  but has noted some tightness in the chest and some heaviness with breathing in the last 2 or 3 weeks perhaps consistent with device going to reversion mode (symptoms  mitigated by atrial fibrillation     DATE TEST EF    8/12  Myoview  Normal LV No ischemia  6/19 Echo   55-60 %     1/23 Echo   55-60% BAE mod    Date Cr K Mg Hgb   5/17   1.9   1/18  0.59 4.5   14.7  11/18 0.55 4.4  15.8  3/20 0.61 4.4 2.1 12.9  9/20 0.63 4.9 2.0   8/21    13.0  10/21 0.69 4.7 1.9   7/22 0.64 3.9  14.1  8/22 0.49 4.5 2.0   1/23 0.66 4.1  8.8  3/23    11.9  8/23 0.73 4.7     Thromboembolic risk factors ( age  -2, HTN-1, Gender-1, Vascular disease +/- 1) for a CHADSVASc Score of >=4   Past Medical History:  Diagnosis Date   Ankle fracture 2016   Arthritis    Atrial fibrillation (HCC)    Depression    Dysrhythmia    Elevated parathyroid hormone    Fracture of orbital floor with routine healing    GERD (gastroesophageal reflux disease)    History of colon polyps    benign   Hypercalcemia    Hypertension    Hyponatremia    Hypothyroid    Macular degeneration    wet in the right and dry in the left   OSA on CPAP  Osteoporosis    Peripheral vascular disease (HCC)    Presence of permanent cardiac pacemaker    PSVT (paroxysmal supraventricular tachycardia)    Rotator cuff tear    Sinus node dysfunction (HCC)    a. s/p MDT pacemaker   Stroke [facility])    Urinary incontinence    Wrist fracture 2018    Past Surgical History:  Procedure Laterality Date   BACK SURGERY     CARDIOVERSION N/A 05/02/2022   Procedure: CARDIOVERSION;  Surgeon: Pixie Casino, MD;  Location: [place];  Service: Cardiovascular;  Laterality: N/A;   CATARACT EXTRACTION     CATARACT EXTRACTION W/ INTRAOCULAR LENS  IMPLANT, BILATERAL Bilateral    COLONOSCOPY     ESOPHAGOGASTRODUODENOSCOPY     IR ANGIO INTRA EXTRACRAN SEL COM CAROTID INNOMINATE BILAT MOD SED  12/30/2017   IR ANGIO VERTEBRAL SEL VERTEBRAL BILAT  MOD SED  12/30/2017   LAPAROSCOPIC CHOLECYSTECTOMY  08/11/1999   LUMBAR [place] SURGERY  02/19/2014   ORIF ANKLE FRACTURE Left 04/13/2015   Procedure: OPEN REDUCTION INTERNAL FIXATION (ORIF) LEFT ANKLE FRACTURE;  Surgeon: Meredith Pel, MD;  Location: [place];  Service: Orthopedics;  Laterality: Left;   PACEMAKER PLACEMENT Right 07/22/2010   a. MDT dual chamber PPM implanted by Dr Caryl Comes    SHOULDER ARTHROSCOPY W/ ROTATOR CUFF REPAIR Right 08/30/2014   WITH MINI-OPEN ROTATOR CUFF REPAIR AND SUBACROMIAL DECOMPRESSION   SHOULDER ARTHROSCOPY WITH ROTATOR CUFF REPAIR AND SUBACROMIAL DECOMPRESSION Right 08/30/2014   Procedure: SHOULDER ARTHROSCOPY WITH MINI-OPEN ROTATOR CUFF REPAIR AND SUBACROMIAL DECOMPRESSION;  Surgeon: Meredith Pel, MD;  Location: [place];  Service: Orthopedics;  Laterality: Right;  RIGHT SHOULDER DIAGNOSTIC OPERATIVE ARTHROSCOPY, SUBACROMIAL DECOMPRESSION, MINI-OPEN ROTATOR CUFF REPAIR.   TOTAL HIP ARTHROPLASTY Right 08/14/2021   Procedure: TOTAL HIP ARTHROPLASTY ANTERIOR APPROACH;  Surgeon: Melrose Nakayama, MD;  [facility];  Service: Orthopedics;  Laterality: Right;   TOTAL KNEE ARTHROPLASTY Left 02/13/2021   Procedure: LEFT TOTAL KNEE ARTHROPLASTY;  Surgeon: Melrose Nakayama, MD;  Location[facility];  Service: Orthopedics;  Laterality: Left;   VAGINAL HYSTERECTOMY  07/22/1968   WRIST FRACTURE SURGERY Left     Current Outpatient Medications  Medication Sig Dispense Refill   acetaminophen (TYLENOL) 500 MG tablet Take 500 mg by mouth 2 (two) times daily.     alendronate (FOSAMAX) 70 MG tablet Take 1 tablet (70 mg total) by mouth every 7 (seven) days. Take with a full glass of water on an empty stomach. 4 tablet 11   amiodarone (PACERONE) 200 MG tablet Take 1 tablet (200 mg total) by mouth 2 (two) times daily. Then decrease dose to daily 60 tablet 0   amiodarone (PACERONE) 200 MG tablet Take 1 tablet (200 mg total) by mouth daily. 90 tablet 3   apixaban (ELIQUIS) 5 MG TABS  tablet Take 1 tablet (5 mg total) by mouth 2 (two) times daily. 180 tablet 3   atorvastatin (LIPITOR) 40 MG tablet Take 40 mg by mouth daily.     B Complex-C (B-COMPLEX WITH VITAMIN C) tablet Take 1 tablet by mouth daily with lunch.      Cholecalciferol (VITAMIN D3) 125 MCG (5000 UT) TABS Take 10,000 Units by mouth daily with breakfast.     dapagliflozin propanediol (FARXIGA) 10 MG TABS tablet Take 1 tablet (10 mg total) by mouth daily before breakfast. 90 tablet 3   diltiazem (CARTIA XT) 120 MG 24 hr capsule TAKE 1 CAPSULE BY MOUTH EVERY MORNING AND 1 CAPSULE DAILY AT BEDTIME 180  capsule 3   furosemide (LASIX) 40 MG tablet Take 2 tablets (80 mg total) by mouth daily. 180 tablet 3   levothyroxine (SYNTHROID) 25 MCG tablet Take 1 tablet (25 mcg total) by mouth daily before breakfast. 30 tablet 2   lisinopril (ZESTRIL) 10 MG tablet Take 1 tablet (10 mg total) by mouth daily. 90 tablet 3   Lutein 20 MG TABS Take 20 mg by mouth daily with breakfast.     magnesium oxide (MAG-OX) 400 MG tablet Take 1 tablet (400 mg total) by mouth daily. 30 tablet 0   Melatonin 10 MG TABS Take 10-40 mg by mouth at bedtime as needed (sleep).     metoprolol succinate (TOPROL-XL) 50 MG 24 hr tablet Take 2 tablets (100 mg total) by mouth 3 (three) times daily. Take with or immediately following a meal. 540 tablet 3   Multiple Vitamin (MULTIVITAMIN WITH MINERALS) TABS tablet Take 1 tablet by mouth daily with breakfast.      Multiple Vitamins-Minerals (PRESERVISION AREDS 2 PO) Take 1 capsule by mouth 2 (two) times daily with a meal.      potassium chloride (KLOR-CON) 10 MEQ tablet Take 1 tablet (10 mEq total) by mouth daily. 90 tablet 3   sertraline (ZOLOFT) 50 MG tablet Take 1 tablet (50 mg total) by mouth daily. 30 tablet 2   No current facility-administered medications for this visit.    Allergies  Allergen Reactions   Citalopram Hydrobromide Other (See Comments)    Other reaction(s): QT prolongation with Tikosyn    Adhesive [Tape] Swelling and Rash   Bacitracin-Polymyxin B Other (See Comments)    Other reaction(s): Unknown   Benzalkonium Chloride Rash    Pt was not aware of this allergy   Neosporin [Neomycin-Polymyxin-Gramicidin] Swelling and Rash    Review of Systems negative except from HPI and PMH  Physical Exam: BP (!) 152/76   Pulse 64   Ht '5\' 8"'$  (1.727 m)   Wt 218 lb (98.9 kg)   LMP 07/22/1968   SpO2 96%   BMI 33.15 kg/m  Well developed and well nourished in no acute distress HENT normal Neck supple with JVP-flat Clear Device pocket well healed; without hematoma or erythema.  There is no tethering  Regular rate and rhythm, no  murmur Abd-soft with active BS No Clubbing cyanosis 1 edema Skin-warm and dry A & Oriented  Grossly normal sensory and motor function  ECG atrial fibrillation with intermittent ventricular pacing  Device function is not normal. Programming changes none device at [facility] See Paceart for details    ECG sinus at 85  intervals 14/09/41 Q waves V1 and V2 ECGs reviewed 2 and 3/23 there are small R waves in leads V1 and V2, suspect lead placement there  Assessment and  Plan Atrial fibrillation-persistent     Atrial tachycardia   Sinus bradycardia   HFpEF   Pacemaker-Medtronic     High Risk Medication Surveillance -amiodraone  Hypertension  Hyperlipidemia  Still struggling with dyspnea.  Atrial fibrillation is persistent.  Failed cardioversion on amiodarone.  Will stop it.   We have reviewed the benefits and risks of generator replacement.  These include but are not limited to lead fracture and infection.  The patient understands, agrees and is willing to proceed.

## 2022-08-21 NOTE — H&P (View-Only) (Signed)
Patient Care Team: Donald Prose, MD as PCP - General (Family Medicine) Deboraha Sprang, MD as PCP - Electrophysiology (Cardiology) Deboraha Sprang, MD as PCP - Cardiology (Cardiology) Bjorn Loser, MD as Consulting Physician (Urology) Deboraha Sprang, MD as Consulting Physician (Cardiology) Chesley Mires, MD as Consulting Physician (Pulmonary Disease) Melrose Nakayama, MD as Consulting Physician (Orthopedic Surgery) Antionette Fairy Isaias Cowman, MD as Referring Physician (Ophthalmology) Paulla Dolly Tamala Fothergill, DPM as Consulting Physician (Podiatry)   HPI  Brittany [name] is a 89 y.o. female Seen in followup for  pacer Medtronic implanted 12/12 for sinus node dysfunction.  She has atrial fibrillation for which she previously took sotalol try dofetilide which was a failure and then started amiodarone with anticipation of cardioversion..  This was done 10/23 and she failed to hold-- anticoagulation with apixaban  She will be undergoing a R knee arthroplasty 08/14/21. She had her L knee replaced 02/13/21. She endorses a history of stroke occurring 2 to 3 years ago and was put on Eliquis. During her previous surgery, she stopped the Eliquis 3 days precluding spinal  Of note, she also hope to replace her R hip because of pain and bone degeneration. Following her surgery, she developed what has been a relatively long-term issue with dyspnea which has been somewhat perplexing.  Seen by pulm  concerns re fluid overload and started on furosemide >> their evaluation was with clear lungs unlikely to be pulmonary  There has been some increased atrial fibrillation burden BNPs have been elevated but has not improved with either diuretics or inhalers.  She has seen RU-AP a number of times with continued complaints.  It has been noted that she can walk with her walker without shortness of breath but when ambulating without becomes quite short of breath.  Her device is reached ERI.  She has had no clear symptoms  but has noted some tightness in the chest and some heaviness with breathing in the last 2 or 3 weeks perhaps consistent with device going to reversion mode (symptoms  mitigated by atrial fibrillation     DATE TEST EF    8/12  Myoview  Normal LV No ischemia  6/19 Echo   55-60 %     1/23 Echo   55-60% BAE mod    Date Cr K Mg Hgb   5/17   1.9   1/18  0.59 4.5   14.7  11/18 0.55 4.4  15.8  3/20 0.61 4.4 2.1 12.9  9/20 0.63 4.9 2.0   8/21    13.0  10/21 0.69 4.7 1.9   7/22 0.64 3.9  14.1  8/22 0.49 4.5 2.0   1/23 0.66 4.1  8.8  3/23    11.9  8/23 0.73 4.7     Thromboembolic risk factors ( age  -2, HTN-1, Gender-1, Vascular disease +/- 1) for a CHADSVASc Score of >=4   Past Medical History:  Diagnosis Date   Ankle fracture 2016   Arthritis    Atrial fibrillation (HCC)    Depression    Dysrhythmia    Elevated parathyroid hormone    Fracture of orbital floor with routine healing    GERD (gastroesophageal reflux disease)    History of colon polyps    benign   Hypercalcemia    Hypertension    Hyponatremia    Hypothyroid    Macular degeneration    wet in the right and dry in the left   OSA on CPAP  Osteoporosis    Peripheral vascular disease (HCC)    Presence of permanent cardiac pacemaker    PSVT (paroxysmal supraventricular tachycardia)    Rotator cuff tear    Sinus node dysfunction (HCC)    a. s/p MDT pacemaker   Stroke [facility])    Urinary incontinence    Wrist fracture 2018    Past Surgical History:  Procedure Laterality Date   BACK SURGERY     CARDIOVERSION N/A 05/02/2022   Procedure: CARDIOVERSION;  Surgeon: Pixie Casino, MD;  Location: [place];  Service: Cardiovascular;  Laterality: N/A;   CATARACT EXTRACTION     CATARACT EXTRACTION W/ INTRAOCULAR LENS  IMPLANT, BILATERAL Bilateral    COLONOSCOPY     ESOPHAGOGASTRODUODENOSCOPY     IR ANGIO INTRA EXTRACRAN SEL COM CAROTID INNOMINATE BILAT MOD SED  12/30/2017   IR ANGIO VERTEBRAL SEL VERTEBRAL BILAT  MOD SED  12/30/2017   LAPAROSCOPIC CHOLECYSTECTOMY  08/11/1999   LUMBAR [place] SURGERY  02/19/2014   ORIF ANKLE FRACTURE Left 04/13/2015   Procedure: OPEN REDUCTION INTERNAL FIXATION (ORIF) LEFT ANKLE FRACTURE;  Surgeon: Meredith Pel, MD;  Location: [place];  Service: Orthopedics;  Laterality: Left;   PACEMAKER PLACEMENT Right 07/22/2010   a. MDT dual chamber PPM implanted by Dr Caryl Comes    SHOULDER ARTHROSCOPY W/ ROTATOR CUFF REPAIR Right 08/30/2014   WITH MINI-OPEN ROTATOR CUFF REPAIR AND SUBACROMIAL DECOMPRESSION   SHOULDER ARTHROSCOPY WITH ROTATOR CUFF REPAIR AND SUBACROMIAL DECOMPRESSION Right 08/30/2014   Procedure: SHOULDER ARTHROSCOPY WITH MINI-OPEN ROTATOR CUFF REPAIR AND SUBACROMIAL DECOMPRESSION;  Surgeon: Meredith Pel, MD;  Location: [place];  Service: Orthopedics;  Laterality: Right;  RIGHT SHOULDER DIAGNOSTIC OPERATIVE ARTHROSCOPY, SUBACROMIAL DECOMPRESSION, MINI-OPEN ROTATOR CUFF REPAIR.   TOTAL HIP ARTHROPLASTY Right 08/14/2021   Procedure: TOTAL HIP ARTHROPLASTY ANTERIOR APPROACH;  Surgeon: Melrose Nakayama, MD;  [facility];  Service: Orthopedics;  Laterality: Right;   TOTAL KNEE ARTHROPLASTY Left 02/13/2021   Procedure: LEFT TOTAL KNEE ARTHROPLASTY;  Surgeon: Melrose Nakayama, MD;  [facility];  Service: Orthopedics;  Laterality: Left;   VAGINAL HYSTERECTOMY  07/22/1968   WRIST FRACTURE SURGERY Left     Current Outpatient Medications  Medication Sig Dispense Refill   acetaminophen (TYLENOL) 500 MG tablet Take 500 mg by mouth 2 (two) times daily.     alendronate (FOSAMAX) 70 MG tablet Take 1 tablet (70 mg total) by mouth every 7 (seven) days. Take with a full glass of water on an empty stomach. 4 tablet 11   amiodarone (PACERONE) 200 MG tablet Take 1 tablet (200 mg total) by mouth 2 (two) times daily. Then decrease dose to daily 60 tablet 0   amiodarone (PACERONE) 200 MG tablet Take 1 tablet (200 mg total) by mouth daily. 90 tablet 3   apixaban (ELIQUIS) 5 MG TABS  tablet Take 1 tablet (5 mg total) by mouth 2 (two) times daily. 180 tablet 3   atorvastatin (LIPITOR) 40 MG tablet Take 40 mg by mouth daily.     B Complex-C (B-COMPLEX WITH VITAMIN C) tablet Take 1 tablet by mouth daily with lunch.      Cholecalciferol (VITAMIN D3) 125 MCG (5000 UT) TABS Take 10,000 Units by mouth daily with breakfast.     dapagliflozin propanediol (FARXIGA) 10 MG TABS tablet Take 1 tablet (10 mg total) by mouth daily before breakfast. 90 tablet 3   diltiazem (CARTIA XT) 120 MG 24 hr capsule TAKE 1 CAPSULE BY MOUTH EVERY MORNING AND 1 CAPSULE DAILY AT BEDTIME 180  capsule 3   furosemide (LASIX) 40 MG tablet Take 2 tablets (80 mg total) by mouth daily. 180 tablet 3   levothyroxine (SYNTHROID) 25 MCG tablet Take 1 tablet (25 mcg total) by mouth daily before breakfast. 30 tablet 2   lisinopril (ZESTRIL) 10 MG tablet Take 1 tablet (10 mg total) by mouth daily. 90 tablet 3   Lutein 20 MG TABS Take 20 mg by mouth daily with breakfast.     magnesium oxide (MAG-OX) 400 MG tablet Take 1 tablet (400 mg total) by mouth daily. 30 tablet 0   Melatonin 10 MG TABS Take 10-40 mg by mouth at bedtime as needed (sleep).     metoprolol succinate (TOPROL-XL) 50 MG 24 hr tablet Take 2 tablets (100 mg total) by mouth 3 (three) times daily. Take with or immediately following a meal. 540 tablet 3   Multiple Vitamin (MULTIVITAMIN WITH MINERALS) TABS tablet Take 1 tablet by mouth daily with breakfast.      Multiple Vitamins-Minerals (PRESERVISION AREDS 2 PO) Take 1 capsule by mouth 2 (two) times daily with a meal.      potassium chloride (KLOR-CON) 10 MEQ tablet Take 1 tablet (10 mEq total) by mouth daily. 90 tablet 3   sertraline (ZOLOFT) 50 MG tablet Take 1 tablet (50 mg total) by mouth daily. 30 tablet 2   No current facility-administered medications for this visit.    Allergies  Allergen Reactions   Citalopram Hydrobromide Other (See Comments)    Other reaction(s): QT prolongation with Tikosyn    Adhesive [Tape] Swelling and Rash   Bacitracin-Polymyxin B Other (See Comments)    Other reaction(s): Unknown   Benzalkonium Chloride Rash    Pt was not aware of this allergy   Neosporin [Neomycin-Polymyxin-Gramicidin] Swelling and Rash    Review of Systems negative except from HPI and PMH  Physical Exam: BP (!) 152/76   Pulse 64   Ht '5\' 8"'$  (1.727 m)   Wt 218 lb (98.9 kg)   LMP 07/22/1968   SpO2 96%   BMI 33.15 kg/m  Well developed and well nourished in no acute distress HENT normal Neck supple with JVP-flat Clear Device pocket well healed; without hematoma or erythema.  There is no tethering  Regular rate and rhythm, no  murmur Abd-soft with active BS No Clubbing cyanosis 1 edema Skin-warm and dry A & Oriented  Grossly normal sensory and motor function  ECG atrial fibrillation with intermittent ventricular pacing  Device function is not normal. Programming changes none device at [facility] See Paceart for details    ECG sinus at 85  intervals 14/09/41 Q waves V1 and V2 ECGs reviewed 2 and 3/23 there are small R waves in leads V1 and V2, suspect lead placement there  Assessment and  Plan Atrial fibrillation-persistent     Atrial tachycardia   Sinus bradycardia   HFpEF   Pacemaker-Medtronic     High Risk Medication Surveillance -amiodraone  Hypertension  Hyperlipidemia  Still struggling with dyspnea.  Atrial fibrillation is persistent.  Failed cardioversion on amiodarone.  Will stop it.   We have reviewed the benefits and risks of generator replacement.  These include but are not limited to lead fracture and infection.  The patient understands, agrees and is willing to proceed.

## 2022-08-21 NOTE — Patient Instructions (Addendum)
Medication Instructions:  Your physician recommends that you continue on your current medications as directed. Please refer to the Current Medication list given to you today.  *If you need a refill on your cardiac medications before your next appointment, please call your pharmacy*   Lab Work: CBC and BMET today  If you have labs (blood work) drawn today and your tests are completely normal, you will receive your results only by: [place] (if you have MyChart) OR A paper copy in the mail If you have any lab test that is abnormal or we need to change your treatment, we will call you to review the results.   Testing/Procedures: None ordered.    Follow-Up: At [facility], you and your health needs are our priority.  As part of our continuing mission to provide you with exceptional heart care, we have created designated Provider Care Teams.  These Care Teams include your primary Cardiologist (physician) and Advanced Practice Providers (APPs -  Physician Assistants and Nurse Practitioners) who all work together to provide you with the care you need, when you need it.  We recommend signing up for the patient portal called "MyChart".  Sign up information is provided on this After Visit Summary.  MyChart is used to connect with patients for Virtual Visits (Telemedicine).  Patients are able to view lab/test results, encounter notes, upcoming appointments, etc.  Non-urgent messages can be sent to your provider as well.   To learn more about what you can do with MyChart, go to [email or web address].    Your next appointment:   To be determined

## 2022-08-21 NOTE — Telephone Encounter (Signed)
Pt is scheduled for generator change on 08/22/2022 per Dr Caryl Comes.

## 2022-08-22 ENCOUNTER — Other Ambulatory Visit: Payer: Self-pay

## 2022-08-22 ENCOUNTER — Ambulatory Visit (HOSPITAL_COMMUNITY)
Admission: RE | Admit: 2022-08-22 | Discharge: 2022-08-22 | Disposition: A | Discharge status: Home / Self Care | Payer: Medicare Other | Source: Ambulatory Visit | Attending: Internal Medicine | Admitting: Internal Medicine

## 2022-08-22 ENCOUNTER — Encounter (HOSPITAL_COMMUNITY)
Admission: RE | Disposition: A | Discharge status: Home / Self Care | Payer: Self-pay | Source: Ambulatory Visit | Attending: Internal Medicine

## 2022-08-22 DIAGNOSIS — I495 Sick sinus syndrome: Secondary | ICD-10-CM | POA: Diagnosis not present

## 2022-08-22 DIAGNOSIS — Z79899 Other long term (current) drug therapy: Secondary | ICD-10-CM | POA: Insufficient documentation

## 2022-08-22 DIAGNOSIS — Z950 Presence of cardiac pacemaker: Secondary | ICD-10-CM

## 2022-08-22 DIAGNOSIS — I4819 Other persistent atrial fibrillation: Secondary | ICD-10-CM | POA: Diagnosis not present

## 2022-08-22 DIAGNOSIS — I5032 Chronic diastolic (congestive) heart failure: Secondary | ICD-10-CM | POA: Diagnosis not present

## 2022-08-22 DIAGNOSIS — Z45010 Encounter for checking and testing of cardiac pacemaker pulse generator [battery]: Secondary | ICD-10-CM | POA: Insufficient documentation

## 2022-08-22 DIAGNOSIS — I4719 Other supraventricular tachycardia: Secondary | ICD-10-CM | POA: Insufficient documentation

## 2022-08-22 DIAGNOSIS — R0600 Dyspnea, unspecified: Secondary | ICD-10-CM | POA: Diagnosis not present

## 2022-08-22 DIAGNOSIS — E785 Hyperlipidemia, unspecified: Secondary | ICD-10-CM | POA: Insufficient documentation

## 2022-08-22 DIAGNOSIS — I110 Hypertensive heart disease with heart failure: Secondary | ICD-10-CM | POA: Diagnosis not present

## 2022-08-22 HISTORY — PX: PPM GENERATOR CHANGEOUT: EP1233

## 2022-08-22 LAB — BASIC METABOLIC PANEL
BUN/Creatinine Ratio: 18 (ref 12–28)
BUN: 17 mg/dL (ref 8–27)
CO2: 25 mmol/L (ref 20–29)
Calcium: 10.2 mg/dL (ref 8.7–10.3)
Chloride: 99 mmol/L (ref 96–106)
Creatinine, Ser: 0.97 mg/dL (ref 0.57–1.00)
Glucose: 101 mg/dL — ABNORMAL HIGH (ref 70–99)
Potassium: 3.7 mmol/L (ref 3.5–5.2)
Sodium: 141 mmol/L (ref 134–144)
eGFR: 57 mL/min/{1.73_m2} — ABNORMAL LOW (ref >59)

## 2022-08-22 LAB — CBC
Hematocrit: 42.9 % (ref 34.0–46.6)
Hemoglobin: 14 g/dL (ref 11.1–15.9)
MCH: 31.8 pg (ref 26.6–33.0)
MCHC: 32.6 g/dL (ref 31.5–35.7)
MCV: 98 fL — ABNORMAL HIGH (ref 79–97)
Platelets: 226 10*3/uL (ref 150–450)
RBC: 4.4 x10E6/uL (ref 3.77–5.28)
RDW: 12.6 % (ref 11.7–15.4)
WBC: 7 10*3/uL (ref 3.4–10.8)

## 2022-08-22 SURGERY — PPM GENERATOR CHANGEOUT

## 2022-08-22 MED ORDER — LIDOCAINE HCL (PF) 1 % IJ SOLN
INTRAMUSCULAR | Status: AC
  Filled 2022-08-22: qty 60

## 2022-08-22 MED ORDER — MIDAZOLAM HCL 5 MG/5ML IJ SOLN
INTRAMUSCULAR | Status: DC | PRN
  Administered 2022-08-22: 1 mg via INTRAVENOUS

## 2022-08-22 MED ORDER — FENTANYL CITRATE (PF) 100 MCG/2ML IJ SOLN
INTRAMUSCULAR | Status: AC
  Filled 2022-08-22: qty 2

## 2022-08-22 MED ORDER — SODIUM CHLORIDE 0.9 % IV SOLN
INTRAVENOUS | Status: AC
  Filled 2022-08-22: qty 2

## 2022-08-22 MED ORDER — SODIUM CHLORIDE 0.9 % IV SOLN: INTRAVENOUS | Status: DC

## 2022-08-22 MED ORDER — CEFAZOLIN SODIUM-DEXTROSE 2-4 GM/100ML-% IV SOLN
2.0000 g | INTRAVENOUS | Status: AC
  Administered 2022-08-22: 2 g via INTRAVENOUS

## 2022-08-22 MED ORDER — MIDAZOLAM HCL 5 MG/5ML IJ SOLN
INTRAMUSCULAR | Status: AC
  Filled 2022-08-22: qty 5

## 2022-08-22 MED ORDER — SODIUM CHLORIDE 0.9 % IV SOLN
80.0000 mg | INTRAVENOUS | Status: AC
  Administered 2022-08-22: 80 mg

## 2022-08-22 MED ORDER — LIDOCAINE HCL (PF) 1 % IJ SOLN
INTRAMUSCULAR | Status: DC | PRN
  Administered 2022-08-22: 60 mL

## 2022-08-22 MED ORDER — FENTANYL CITRATE (PF) 100 MCG/2ML IJ SOLN
INTRAMUSCULAR | Status: DC | PRN
  Administered 2022-08-22: 25 ug via INTRAVENOUS

## 2022-08-22 MED ORDER — ACETAMINOPHEN 325 MG PO TABS: 325.0000 mg | ORAL_TABLET | ORAL | Status: DC | PRN

## 2022-08-22 MED ORDER — CEFAZOLIN SODIUM-DEXTROSE 2-4 GM/100ML-% IV SOLN
INTRAVENOUS | Status: AC
  Filled 2022-08-22: qty 100

## 2022-08-22 MED ORDER — CHLORHEXIDINE GLUCONATE 4 % EX LIQD: 4.0000 | Freq: Once | CUTANEOUS | Status: DC

## 2022-08-22 SURGICAL SUPPLY — 6 items
CABLE SURGICAL S-101-97-12 (CABLE) ×1 IMPLANT
HEMOSTAT SURGICEL 2X4 FIBR (HEMOSTASIS) IMPLANT
IPG PACE AZUR XT DR MRI W1DR01 (Pacemaker) IMPLANT
PACE AZURE XT DR MRI W1DR01 (Pacemaker) ×1 IMPLANT
PAD DEFIB RADIO PHYSIO CONN (PAD) ×1 IMPLANT
TRAY PACEMAKER INSERTION (PACKS) ×1 IMPLANT

## 2022-08-22 NOTE — Interval H&P Note (Signed)
History and Physical Interval Note:  08/22/2022 1:03 PM  Brittany [name]  has presented today for surgery, with the diagnosis of ERI.  The various methods of treatment have been discussed with the patient and family. After consideration of risks, benefits and other options for treatment, the patient has consented to  Procedure(s): PPM GENERATOR CHANGEOUT (N/A) as a surgical intervention.  The patient's history has been reviewed, patient examined, no change in status, stable for surgery.  I have reviewed the patient's chart and labs.  Questions were answered to the patient's satisfaction.     Virl Axe

## 2022-08-22 NOTE — Discharge Instructions (Signed)
Implantable Cardiac Device Battery Change, Care After  This sheet gives you information about how to care for yourself after your procedure. Your health care provider may also give you more specific instructions. If you have problems or questions, contact your health care provider. What can I expect after the procedure? After your procedure, it is common to have:  Pain or soreness at the site where the cardiac device was inserted.  Swelling at the site where the cardiac device was inserted.  You should received an information card for your new device in 4-8 weeks. Follow these instructions at home: Incision care   Keep the incision clean and dry. ? Do not take baths, swim, or use a hot tub until after your wound check.  ? Do not shower for at least 7 days, or as directed by your health care provider. ? Pat the area dry with a clean towel. Do not rub the area. This may cause bleeding.  Follow instructions from your health care provider about how to take care of your incision. Make sure you: ? Leave stitches (sutures), skin glue, or adhesive strips in place. These skin closures may need to stay in place for 2 weeks or longer. If adhesive strip edges start to loosen and curl up, you may trim the loose edges. Do not remove adhesive strips completely unless your health care provider tells you to do that.  Check your incision area every day for signs of infection. Check for: ? More redness, swelling, or pain. ? More fluid or blood. ? Warmth. ? Pus or a bad smell. Activity  Do not lift anything that is heavier than 10 lb (4.5 kg) until your health care provider says it is okay to do so.  For the first week, or as long as told by your health care provider: ? Avoid lifting your affected arm higher than your shoulder. ? After 1 week, Be gentle when you move your arms over your head. It is okay to raise your arm to comb your hair. ? Avoid strenuous exercise.  Ask your health care provider  when it is okay to: ? Resume your normal activities. ? Return to work or school. ? Resume sexual activity. Eating and drinking  Eat a heart-healthy diet. This should include plenty of fresh fruits and vegetables, whole grains, low-fat dairy products, and lean protein like chicken and fish.  Limit alcohol intake to no more than 1 drink a day for non-pregnant women and 2 drinks a day for men. One drink equals 12 oz of beer, 5 oz of wine, or 1 oz of hard liquor.  Check ingredients and nutrition facts on packaged foods and beverages. Avoid the following types of food: ? Food that is high in salt (sodium). ? Food that is high in saturated fat, like full-fat dairy or red meat. ? Food that is high in trans fat, like fried food. ? Food and drinks that are high in sugar. Lifestyle  Do not use any products that contain nicotine or tobacco, such as cigarettes and e-cigarettes. If you need help quitting, ask your health care provider.  Take steps to manage and control your weight.  Once cleared, get regular exercise. Aim for 150 minutes of moderate-intensity exercise (such as walking or yoga) or 75 minutes of vigorous exercise (such as running or swimming) each week.  Manage other health problems, such as diabetes or high blood pressure. Ask your health care provider how you can manage these conditions. General instructions  Do   not drive for 24 hours after your procedure if you were given a medicine to help you relax (sedative).  Take over-the-counter and prescription medicines only as told by your health care provider.  Avoid putting pressure on the area where the cardiac device was placed.  If you need an MRI after your cardiac device has been placed, be sure to tell the health care provider who orders the MRI that you have a cardiac device.  Avoid close and prolonged exposure to electrical devices that have strong magnetic fields. These include: ? Cell phones. Avoid keeping them in a  pocket near the cardiac device, and try using the ear opposite the cardiac device. ? MP3 players. ? Household appliances, like microwaves. ? Metal detectors. ? Electric generators. ? High-tension wires.  Keep all follow-up visits as directed by your health care provider. This is important. Contact a health care provider if:  You have pain at the incision site that is not relieved by over-the-counter or prescription medicines.  You have any of these around your incision site or coming from it: ? More redness, swelling, or pain. ? Fluid or blood. ? Warmth to the touch. ? Pus or a bad smell.  You have a fever.  You feel brief, occasional palpitations, light-headedness, or any symptoms that you think might be related to your heart. Get help right away if:  You experience chest pain that is different from the pain at the cardiac device site.  You develop a red streak that extends above or below the incision site.  You experience shortness of breath.  You have palpitations or an irregular heartbeat.  You have light-headedness that does not go away quickly.  You faint or have dizzy spells.  Your pulse suddenly drops or increases rapidly and does not return to normal.  You begin to gain weight and your legs and ankles swell. Summary  After your procedure, it is common to have pain, soreness, and some swelling where the cardiac device was inserted.  Make sure to keep your incision clean and dry. Follow instructions from your health care provider about how to take care of your incision.  Check your incision every day for signs of infection, such as more pain or swelling, pus or a bad smell, warmth, or leaking fluid and blood.  Avoid strenuous exercise and lifting your left arm higher than your shoulder for 2 weeks, or as long as told by your health care provider. This information is not intended to replace advice given to you by your health care provider. Make sure you discuss  any questions you have with your health care provider.   

## 2022-08-23 ENCOUNTER — Encounter (HOSPITAL_COMMUNITY): Payer: Self-pay | Admitting: Internal Medicine

## 2022-08-23 DIAGNOSIS — M9903 Segmental and somatic dysfunction of lumbar region: Secondary | ICD-10-CM | POA: Diagnosis not present

## 2022-08-23 DIAGNOSIS — M546 Pain in thoracic spine: Secondary | ICD-10-CM | POA: Diagnosis not present

## 2022-08-23 DIAGNOSIS — M9902 Segmental and somatic dysfunction of thoracic region: Secondary | ICD-10-CM | POA: Diagnosis not present

## 2022-08-23 DIAGNOSIS — E039 Hypothyroidism, unspecified: Secondary | ICD-10-CM | POA: Diagnosis not present

## 2022-08-23 DIAGNOSIS — M9950 Intervertebral disc stenosis of neural canal of head region: Secondary | ICD-10-CM | POA: Diagnosis not present

## 2022-08-26 DIAGNOSIS — M546 Pain in thoracic spine: Secondary | ICD-10-CM | POA: Diagnosis not present

## 2022-08-26 DIAGNOSIS — M9903 Segmental and somatic dysfunction of lumbar region: Secondary | ICD-10-CM | POA: Diagnosis not present

## 2022-08-26 DIAGNOSIS — M9950 Intervertebral disc stenosis of neural canal of head region: Secondary | ICD-10-CM | POA: Diagnosis not present

## 2022-08-26 DIAGNOSIS — M9902 Segmental and somatic dysfunction of thoracic region: Secondary | ICD-10-CM | POA: Diagnosis not present

## 2022-08-27 ENCOUNTER — Telehealth (HOSPITAL_COMMUNITY): Payer: Self-pay

## 2022-08-27 ENCOUNTER — Other Ambulatory Visit (HOSPITAL_COMMUNITY): Payer: Self-pay | Admitting: Interventional Radiology

## 2022-08-27 DIAGNOSIS — I771 Stricture of artery: Secondary | ICD-10-CM

## 2022-08-27 NOTE — Telephone Encounter (Signed)
Called to schedule US carotid, no answer, left vm. AB

## 2022-08-29 ENCOUNTER — Encounter (HOSPITAL_COMMUNITY): Payer: Self-pay | Admitting: *Deleted

## 2022-08-29 DIAGNOSIS — M9902 Segmental and somatic dysfunction of thoracic region: Secondary | ICD-10-CM | POA: Diagnosis not present

## 2022-08-29 DIAGNOSIS — M9950 Intervertebral disc stenosis of neural canal of head region: Secondary | ICD-10-CM | POA: Diagnosis not present

## 2022-08-29 DIAGNOSIS — M9903 Segmental and somatic dysfunction of lumbar region: Secondary | ICD-10-CM | POA: Diagnosis not present

## 2022-08-29 DIAGNOSIS — M546 Pain in thoracic spine: Secondary | ICD-10-CM | POA: Diagnosis not present

## 2022-09-05 ENCOUNTER — Ambulatory Visit: Payer: Medicare Other | Attending: Physician Assistant | Admitting: Cardiology

## 2022-09-05 ENCOUNTER — Encounter: Payer: Self-pay | Admitting: Physician Assistant

## 2022-09-05 VITALS — BP 140/68 | HR 93 | Ht 68.0 in | Wt 218.2 lb

## 2022-09-05 DIAGNOSIS — Z950 Presence of cardiac pacemaker: Secondary | ICD-10-CM | POA: Insufficient documentation

## 2022-09-05 LAB — CUP PACEART INCLINIC DEVICE CHECK
Date Time Interrogation Session: 20240215114448
Implantable Lead Connection Status: 753985
Implantable Lead Connection Status: 753985
Implantable Lead Implant Date: 20121213
Implantable Lead Implant Date: 20121213
Implantable Lead Location: 753859
Implantable Lead Location: 753860
Implantable Lead Model: 4076
Implantable Lead Model: 4076
Implantable Lead Serial Number: 835025
Implantable Lead Serial Number: 869269
Implantable Pulse Generator Implant Date: 20121213

## 2022-09-05 NOTE — Patient Instructions (Signed)
Medication Instructions:   Your physician recommends that you continue on your current medications as directed. Please refer to the Current Medication list given to you today.  *If you need a refill on your cardiac medications before your next appointment, please call your pharmacy*   Lab Work:  [place]   If you have labs (blood work) drawn today and your tests are completely normal, you will receive your results only by: [place] (if you have MyChart) OR A paper copy in the mail If you have any lab test that is abnormal or we need to change your treatment, we will call you to review the results.   Testing/Procedures: NONE ORDERED  TODAY    Follow-Up: At [facility], you and your health needs are our priority.  As part of our continuing mission to provide you with exceptional heart care, we have created designated Provider Care Teams.  These Care Teams include your primary Cardiologist (physician) and Advanced Practice Providers (APPs -  Physician Assistants and Nurse Practitioners) who all work together to provide you with the care you need, when you need it.  We recommend signing up for the patient portal called "MyChart".  Sign up information is provided on this After Visit Summary.  MyChart is used to connect with patients for Virtual Visits (Telemedicine).  Patients are able to view lab/test results, encounter notes, upcoming appointments, etc.  Non-urgent messages can be sent to your provider as well.   To learn more about what you can do with MyChart, go to [email or web address].    Your next appointment:   3 month(s)  Provider:   Virl Axe, MD

## 2022-09-05 NOTE — Progress Notes (Signed)
Gen Change Wound check appointment.  Steri-strips removed. Wound without redness or edema. Incision edges approximated, wound well healed. Normal device function. Thresholds, sensing, and impedances consistent with implant measurements. Device programmed at auto capture. Histogram distribution appropriate for patient and level of activity. >90% AF burden, not new for patient.  No ventricular arrhythmias noted. Patient educated about wound care, arm mobility, lifting restrictions, shock plan. ROV in 3 months with implanting physician.

## 2022-09-09 ENCOUNTER — Ambulatory Visit (HOSPITAL_COMMUNITY)
Admission: RE | Admit: 2022-09-09 | Discharge: 2022-09-09 | Disposition: A | Discharge status: Home / Self Care | Payer: Medicare Other | Source: Ambulatory Visit | Attending: Interventional Radiology | Admitting: Interventional Radiology

## 2022-09-09 DIAGNOSIS — I771 Stricture of artery: Secondary | ICD-10-CM

## 2022-09-09 NOTE — Progress Notes (Signed)
Carotid duplex has been completed.   Preliminary results in CV Proc.   Zyron Deeley Korena Nass 09/09/2022 1:30 PM

## 2022-09-12 ENCOUNTER — Encounter: Payer: Self-pay | Admitting: Internal Medicine

## 2022-09-17 ENCOUNTER — Telehealth (HOSPITAL_COMMUNITY): Payer: Self-pay

## 2022-09-17 NOTE — Telephone Encounter (Signed)
Called pt regarding recent imaging, no answer, left vm. AB

## 2022-09-17 NOTE — Progress Notes (Signed)
Remote pacemaker transmission.   

## 2022-09-17 NOTE — Addendum Note (Signed)
Addended by: Douglass Rivers D on: 09/17/2022 01:33 PM   Modules accepted: Level of Service

## 2022-09-17 NOTE — Telephone Encounter (Signed)
Pt agreed to f/u in 6 months with a US carotid. AB

## 2022-09-19 ENCOUNTER — Other Ambulatory Visit: Payer: Self-pay

## 2022-09-19 MED ORDER — POTASSIUM CHLORIDE ER 10 MEQ PO TBCR: 10.0000 meq | EXTENDED_RELEASE_TABLET | Freq: Every day | ORAL | 3 refills | Status: DC

## 2022-10-01 DIAGNOSIS — L820 Inflamed seborrheic keratosis: Secondary | ICD-10-CM | POA: Diagnosis not present

## 2022-10-01 DIAGNOSIS — X32XXXD Exposure to sunlight, subsequent encounter: Secondary | ICD-10-CM | POA: Diagnosis not present

## 2022-10-01 DIAGNOSIS — L570 Actinic keratosis: Secondary | ICD-10-CM | POA: Diagnosis not present

## 2022-10-02 DIAGNOSIS — E213 Hyperparathyroidism, unspecified: Secondary | ICD-10-CM | POA: Diagnosis not present

## 2022-10-03 ENCOUNTER — Other Ambulatory Visit: Payer: Self-pay | Admitting: Internal Medicine

## 2022-10-07 ENCOUNTER — Encounter (HOSPITAL_COMMUNITY): Payer: Medicare Other

## 2022-10-11 ENCOUNTER — Ambulatory Visit (HOSPITAL_COMMUNITY): Payer: Medicare Other | Attending: Physician Assistant

## 2022-10-11 DIAGNOSIS — Z950 Presence of cardiac pacemaker: Secondary | ICD-10-CM | POA: Insufficient documentation

## 2022-10-11 DIAGNOSIS — I10 Essential (primary) hypertension: Secondary | ICD-10-CM | POA: Insufficient documentation

## 2022-10-11 DIAGNOSIS — Z8673 Personal history of transient ischemic attack (TIA), and cerebral infarction without residual deficits: Secondary | ICD-10-CM | POA: Diagnosis not present

## 2022-10-11 DIAGNOSIS — I739 Peripheral vascular disease, unspecified: Secondary | ICD-10-CM | POA: Insufficient documentation

## 2022-10-11 DIAGNOSIS — E039 Hypothyroidism, unspecified: Secondary | ICD-10-CM | POA: Diagnosis not present

## 2022-10-11 DIAGNOSIS — K219 Gastro-esophageal reflux disease without esophagitis: Secondary | ICD-10-CM | POA: Diagnosis not present

## 2022-10-11 DIAGNOSIS — E871 Hypo-osmolality and hyponatremia: Secondary | ICD-10-CM | POA: Diagnosis not present

## 2022-10-11 DIAGNOSIS — I4891 Unspecified atrial fibrillation: Secondary | ICD-10-CM | POA: Insufficient documentation

## 2022-10-11 DIAGNOSIS — H3530 Unspecified macular degeneration: Secondary | ICD-10-CM | POA: Diagnosis not present

## 2022-10-11 DIAGNOSIS — F32A Depression, unspecified: Secondary | ICD-10-CM | POA: Diagnosis not present

## 2022-10-11 DIAGNOSIS — R0600 Dyspnea, unspecified: Secondary | ICD-10-CM | POA: Diagnosis present

## 2022-10-11 DIAGNOSIS — R0602 Shortness of breath: Secondary | ICD-10-CM | POA: Diagnosis not present

## 2022-10-11 DIAGNOSIS — M1990 Unspecified osteoarthritis, unspecified site: Secondary | ICD-10-CM | POA: Insufficient documentation

## 2022-10-11 DIAGNOSIS — G4733 Obstructive sleep apnea (adult) (pediatric): Secondary | ICD-10-CM | POA: Diagnosis not present

## 2022-10-16 DIAGNOSIS — H40013 Open angle with borderline findings, low risk, bilateral: Secondary | ICD-10-CM | POA: Diagnosis not present

## 2022-10-16 DIAGNOSIS — H353134 Nonexudative age-related macular degeneration, bilateral, advanced atrophic with subfoveal involvement: Secondary | ICD-10-CM | POA: Diagnosis not present

## 2022-10-16 DIAGNOSIS — Z961 Presence of intraocular lens: Secondary | ICD-10-CM | POA: Diagnosis not present

## 2022-10-16 DIAGNOSIS — H04123 Dry eye syndrome of bilateral lacrimal glands: Secondary | ICD-10-CM | POA: Diagnosis not present

## 2022-10-16 DIAGNOSIS — H3554 Dystrophies primarily involving the retinal pigment epithelium: Secondary | ICD-10-CM | POA: Diagnosis not present

## 2022-11-26 DIAGNOSIS — I4891 Unspecified atrial fibrillation: Secondary | ICD-10-CM | POA: Diagnosis not present

## 2022-11-26 DIAGNOSIS — I5032 Chronic diastolic (congestive) heart failure: Secondary | ICD-10-CM | POA: Diagnosis not present

## 2022-11-26 DIAGNOSIS — E785 Hyperlipidemia, unspecified: Secondary | ICD-10-CM | POA: Diagnosis not present

## 2022-11-26 DIAGNOSIS — I10 Essential (primary) hypertension: Secondary | ICD-10-CM | POA: Diagnosis not present

## 2022-11-26 DIAGNOSIS — I480 Paroxysmal atrial fibrillation: Secondary | ICD-10-CM | POA: Diagnosis not present

## 2022-11-28 ENCOUNTER — Encounter: Payer: Medicare Other | Admitting: Internal Medicine

## 2022-11-29 ENCOUNTER — Ambulatory Visit (INDEPENDENT_AMBULATORY_CARE_PROVIDER_SITE_OTHER): Payer: Medicare Other | Admitting: Internal Medicine

## 2022-11-29 ENCOUNTER — Encounter: Payer: Self-pay | Admitting: Internal Medicine

## 2022-11-29 VITALS — BP 128/74 | HR 77 | Ht 68.0 in | Wt 211.2 lb

## 2022-11-29 DIAGNOSIS — R001 Bradycardia, unspecified: Secondary | ICD-10-CM | POA: Diagnosis not present

## 2022-11-29 DIAGNOSIS — Z79899 Other long term (current) drug therapy: Secondary | ICD-10-CM | POA: Diagnosis not present

## 2022-11-29 DIAGNOSIS — M25531 Pain in right wrist: Secondary | ICD-10-CM | POA: Diagnosis present

## 2022-11-29 DIAGNOSIS — Z79890 Hormone replacement therapy (postmenopausal): Secondary | ICD-10-CM | POA: Diagnosis not present

## 2022-11-29 DIAGNOSIS — S065X0A Traumatic subdural hemorrhage without loss of consciousness, initial encounter: Secondary | ICD-10-CM | POA: Diagnosis not present

## 2022-11-29 DIAGNOSIS — S065XAA Traumatic subdural hemorrhage with loss of consciousness status unknown, initial encounter: Secondary | ICD-10-CM | POA: Diagnosis not present

## 2022-11-29 DIAGNOSIS — Z950 Presence of cardiac pacemaker: Secondary | ICD-10-CM | POA: Diagnosis not present

## 2022-11-29 DIAGNOSIS — Z043 Encounter for examination and observation following other accident: Secondary | ICD-10-CM | POA: Diagnosis not present

## 2022-11-29 DIAGNOSIS — I110 Hypertensive heart disease with heart failure: Secondary | ICD-10-CM | POA: Diagnosis present

## 2022-11-29 DIAGNOSIS — E876 Hypokalemia: Secondary | ICD-10-CM | POA: Diagnosis present

## 2022-11-29 DIAGNOSIS — I4891 Unspecified atrial fibrillation: Secondary | ICD-10-CM | POA: Diagnosis not present

## 2022-11-29 DIAGNOSIS — R000 Tachycardia, unspecified: Secondary | ICD-10-CM | POA: Diagnosis not present

## 2022-11-29 DIAGNOSIS — I6200 Nontraumatic subdural hemorrhage, unspecified: Secondary | ICD-10-CM | POA: Diagnosis not present

## 2022-11-29 DIAGNOSIS — Z87891 Personal history of nicotine dependence: Secondary | ICD-10-CM | POA: Diagnosis not present

## 2022-11-29 DIAGNOSIS — I4819 Other persistent atrial fibrillation: Secondary | ICD-10-CM | POA: Diagnosis not present

## 2022-11-29 DIAGNOSIS — I5032 Chronic diastolic (congestive) heart failure: Secondary | ICD-10-CM

## 2022-11-29 DIAGNOSIS — I4821 Permanent atrial fibrillation: Secondary | ICD-10-CM | POA: Diagnosis present

## 2022-11-29 DIAGNOSIS — E785 Hyperlipidemia, unspecified: Secondary | ICD-10-CM | POA: Diagnosis present

## 2022-11-29 DIAGNOSIS — K219 Gastro-esophageal reflux disease without esophagitis: Secondary | ICD-10-CM | POA: Diagnosis present

## 2022-11-29 DIAGNOSIS — S0990XA Unspecified injury of head, initial encounter: Secondary | ICD-10-CM | POA: Diagnosis not present

## 2022-11-29 DIAGNOSIS — S299XXA Unspecified injury of thorax, initial encounter: Secondary | ICD-10-CM | POA: Diagnosis not present

## 2022-11-29 DIAGNOSIS — Y92009 Unspecified place in unspecified non-institutional (private) residence as the place of occurrence of the external cause: Secondary | ICD-10-CM | POA: Diagnosis not present

## 2022-11-29 DIAGNOSIS — R55 Syncope and collapse: Secondary | ICD-10-CM | POA: Diagnosis not present

## 2022-11-29 DIAGNOSIS — Z66 Do not resuscitate: Secondary | ICD-10-CM | POA: Diagnosis present

## 2022-11-29 DIAGNOSIS — Z96653 Presence of artificial knee joint, bilateral: Secondary | ICD-10-CM | POA: Diagnosis present

## 2022-11-29 DIAGNOSIS — I739 Peripheral vascular disease, unspecified: Secondary | ICD-10-CM | POA: Diagnosis present

## 2022-11-29 DIAGNOSIS — Z7901 Long term (current) use of anticoagulants: Secondary | ICD-10-CM | POA: Diagnosis not present

## 2022-11-29 DIAGNOSIS — I4719 Other supraventricular tachycardia: Secondary | ICD-10-CM | POA: Diagnosis not present

## 2022-11-29 DIAGNOSIS — Z8249 Family history of ischemic heart disease and other diseases of the circulatory system: Secondary | ICD-10-CM | POA: Diagnosis not present

## 2022-11-29 DIAGNOSIS — H3530 Unspecified macular degeneration: Secondary | ICD-10-CM | POA: Diagnosis present

## 2022-11-29 DIAGNOSIS — Z961 Presence of intraocular lens: Secondary | ICD-10-CM | POA: Diagnosis present

## 2022-11-29 DIAGNOSIS — R569 Unspecified convulsions: Secondary | ICD-10-CM | POA: Diagnosis not present

## 2022-11-29 DIAGNOSIS — M810 Age-related osteoporosis without current pathological fracture: Secondary | ICD-10-CM | POA: Diagnosis present

## 2022-11-29 DIAGNOSIS — G4733 Obstructive sleep apnea (adult) (pediatric): Secondary | ICD-10-CM | POA: Diagnosis present

## 2022-11-29 DIAGNOSIS — Z96641 Presence of right artificial hip joint: Secondary | ICD-10-CM | POA: Diagnosis present

## 2022-11-29 DIAGNOSIS — W19XXXA Unspecified fall, initial encounter: Secondary | ICD-10-CM | POA: Diagnosis not present

## 2022-11-29 DIAGNOSIS — E039 Hypothyroidism, unspecified: Secondary | ICD-10-CM | POA: Diagnosis present

## 2022-11-29 DIAGNOSIS — S199XXA Unspecified injury of neck, initial encounter: Secondary | ICD-10-CM | POA: Diagnosis not present

## 2022-11-29 MED ORDER — FUROSEMIDE 40 MG PO TABS: 80.0000 mg | ORAL_TABLET | ORAL | 3 refills | Status: DC

## 2022-11-29 NOTE — Patient Instructions (Signed)
Medication Instructions:  Your physician has recommended you make the following change in your medication:   ** Begin taking Furosemide every other day.    *If you need a refill on your cardiac medications before your next appointment, please call your pharmacy*   Lab Work: None ordered.  If you have labs (blood work) drawn today and your tests are completely normal, you will receive your results only by: MyChart Message (if you have MyChart) OR A paper copy in the mail If you have any lab test that is abnormal or we need to change your treatment, we will call you to review the results.   Testing/Procedures: None ordered.    Follow-Up: At [facility], you and your health needs are our priority.  As part of our continuing mission to provide you with exceptional heart care, we have created designated Provider Care Teams.  These Care Teams include your primary Cardiologist (physician) and Advanced Practice Providers (APPs -  Physician Assistants and Nurse Practitioners) who all work together to provide you with the care you need, when you need it.  We recommend signing up for the patient portal called "MyChart".  Sign up information is provided on this After Visit Summary.  MyChart is used to connect with patients for Virtual Visits (Telemedicine).  Patients are able to view lab/test results, encounter notes, upcoming appointments, etc.  Non-urgent messages can be sent to your provider as well.   To learn more about what you can do with MyChart, go to [email or web address].    Your next appointment:   6 months with Yevette Edwards, PA-C

## 2022-11-29 NOTE — Progress Notes (Signed)
Patient Care Team: Deatra James, MD as PCP - General (Family Medicine) Duke Salvia, MD as PCP - Electrophysiology (Cardiology) Duke Salvia, MD as PCP - Cardiology (Cardiology) Alfredo Martinez, MD as Consulting Physician (Urology) Duke Salvia, MD as Consulting Physician (Cardiology) Coralyn Helling, MD as Consulting Physician (Pulmonary Disease) Marcene Corning, MD as Consulting Physician (Orthopedic Surgery) Arnetha Gula Lucious Groves, MD as Referring Physician (Ophthalmology) Charlsie Merles Kirstie Peri, DPM as Consulting Physician (Podiatry)   HPI  Brittany [name] is a 89 y.o. female Seen in followup for  pacer Medtronic implanted 12/12 for sinus node dysfunction.  She has atrial fibrillation for which she previously took sotalol try dofetilide which was a failure and then started amiodarone with anticipation of cardioversion..  This was done 10/23 and she failed to hold-- anticoagulation with apixaban ; device at RRT and replaced to/24  She will be undergoing a R knee arthroplasty 08/14/21. She had her L knee replaced 02/13/21. She endorses a history of stroke occurring 2 to 3 years ago and was put on Eliquis. During her previous surgery, she stopped the Eliquis 3 days precluding spinal  Of note, she also hope to replace her R hip because of pain and bone degeneration. Following her surgery, she developed what has been a relatively long-term issue with dyspnea which has been somewhat perplexing.  Seen by pulm  concerns re fluid overload and started on furosemide >> their evaluation was with clear lungs unlikely to be pulmonary   Atrial fibrillation is now permanent.  Rate control is with beta-blockers and calcium blockers.  Blood pressure has been variable, but reasonably well-controlled.  Dyspnea is considerably better over recent months.  Able to walk back today without difficulty.  Able to do most of her day ADLs without shortness of breath.  No edema.     DATE TEST EF    8/12   Myoview  Normal LV No ischemia  6/19 Echo   55-60 %     1/23 Echo   55-60% BAE mod    Date Cr K Mg Hgb   5/17   1.9   1/18  0.59 4.5   14.7  11/18 0.55 4.4  15.8  3/20 0.61 4.4 2.1 12.9  9/20 0.63 4.9 2.0   8/21    13.0  10/21 0.69 4.7 1.9   7/22 0.64 3.9  14.1  8/22 0.49 4.5 2.0   1/23 0.66 4.1  8.8  3/23    11.9  8/23 0.73 4.7     Thromboembolic risk factors ( age  -2, HTN-1, Gender-1, Vascular disease +/- 1) for a CHADSVASc Score of >=4   Past Medical History:  Diagnosis Date   Ankle fracture 2016   Arthritis    Atrial fibrillation (HCC)    Depression    Dysrhythmia    Elevated parathyroid hormone    Fracture of orbital floor with routine healing    GERD (gastroesophageal reflux disease)    History of colon polyps    benign   Hypercalcemia    Hypertension    Hyponatremia    Hypothyroid    Macular degeneration    wet in the right and dry in the left   OSA on CPAP    Osteoporosis    Peripheral vascular disease (HCC)    Presence of permanent cardiac pacemaker    PSVT (paroxysmal supraventricular tachycardia)    Rotator cuff tear    Sinus node dysfunction (HCC)    a. s/p MDT  pacemaker   Stroke [facility])    Urinary incontinence    Wrist fracture 2018    Past Surgical History:  Procedure Laterality Date   BACK SURGERY     CARDIOVERSION N/A 05/02/2022   Procedure: CARDIOVERSION;  Surgeon: Chrystie Nose, MD;  Location: [facility] ENDOSCOPY;  Service: Cardiovascular;  Laterality: N/A;   CATARACT EXTRACTION     CATARACT EXTRACTION W/ INTRAOCULAR LENS  IMPLANT, BILATERAL Bilateral    COLONOSCOPY     ESOPHAGOGASTRODUODENOSCOPY     IR ANGIO INTRA EXTRACRAN SEL COM CAROTID INNOMINATE BILAT MOD SED  12/30/2017   IR ANGIO VERTEBRAL SEL VERTEBRAL BILAT MOD SED  12/30/2017   LAPAROSCOPIC CHOLECYSTECTOMY  08/11/1999   LUMBAR DISC SURGERY  02/19/2014   ORIF ANKLE FRACTURE Left 04/13/2015   Procedure: OPEN REDUCTION INTERNAL FIXATION (ORIF) LEFT ANKLE FRACTURE;  Surgeon: Cammy Copa, MD;  Location: [facility] OR;  Service: Orthopedics;  Laterality: Left;   PACEMAKER PLACEMENT Right 07/22/2010   a. MDT dual chamber PPM implanted by Dr Graciela Husbands    [facility] GENERATOR CHANGEOUT N/A 08/22/2022   Procedure: PPM GENERATOR Janeann Merl;  Surgeon: Duke Salvia, MD;  Location: [facility] INVASIVE CV LAB;  Service: Cardiovascular;  Laterality: N/A;   SHOULDER ARTHROSCOPY W/ ROTATOR CUFF REPAIR Right 08/30/2014   WITH MINI-OPEN ROTATOR CUFF REPAIR AND SUBACROMIAL DECOMPRESSION   SHOULDER ARTHROSCOPY WITH ROTATOR CUFF REPAIR AND SUBACROMIAL DECOMPRESSION Right 08/30/2014   Procedure: SHOULDER ARTHROSCOPY WITH MINI-OPEN ROTATOR CUFF REPAIR AND SUBACROMIAL DECOMPRESSION;  Surgeon: Cammy Copa, MD;  Location: [facility] OR;  Service: Orthopedics;  Laterality: Right;  RIGHT SHOULDER DIAGNOSTIC OPERATIVE ARTHROSCOPY, SUBACROMIAL DECOMPRESSION, MINI-OPEN ROTATOR CUFF REPAIR.   TOTAL HIP ARTHROPLASTY Right 08/14/2021   Procedure: TOTAL HIP ARTHROPLASTY ANTERIOR APPROACH;  Surgeon: Marcene Corning, MD;  Location: [facility] ORS;  Service: Orthopedics;  Laterality: Right;   TOTAL KNEE ARTHROPLASTY Left 02/13/2021   Procedure: LEFT TOTAL KNEE ARTHROPLASTY;  Surgeon: Marcene Corning, MD;  Location: [facility] ORS;  Service: Orthopedics;  Laterality: Left;   VAGINAL HYSTERECTOMY  07/22/1968   WRIST FRACTURE SURGERY Left     Current Outpatient Medications  Medication Sig Dispense Refill   acetaminophen (TYLENOL) 500 MG tablet Take 500 mg by mouth 2 (two) times daily.     alendronate (FOSAMAX) 70 MG tablet Take 1 tablet (70 mg total) by mouth every 7 (seven) days. Take with a full glass of water on an empty stomach. 4 tablet 11   apixaban (ELIQUIS) 5 MG TABS tablet Take 1 tablet (5 mg total) by mouth 2 (two) times daily. 180 tablet 3   atorvastatin (LIPITOR) 40 MG tablet Take 40 mg by mouth daily.     B Complex-C (B-COMPLEX WITH VITAMIN C) tablet Take 1 tablet by mouth daily with lunch.      Cholecalciferol (VITAMIN D3) 125 MCG  (5000 UT) TABS Take 10,000 Units by mouth daily with breakfast.     dapagliflozin propanediol (FARXIGA) 10 MG TABS tablet Take 1 tablet (10 mg total) by mouth daily before breakfast. 90 tablet 3   diltiazem (CARTIA XT) 120 MG 24 hr capsule TAKE 1 CAPSULE BY MOUTH EVERY MORNING AND 1 CAPSULE DAILY AT BEDTIME 180 capsule 3   furosemide (LASIX) 40 MG tablet Take 2 tablets (80 mg total) by mouth daily. 180 tablet 3   levothyroxine (SYNTHROID) 88 MCG tablet Take 88 mcg by mouth daily before breakfast.     lisinopril (ZESTRIL) 10 MG tablet Take 1 tablet (10 mg total) by mouth daily. 90 tablet 3  Lutein 20 MG TABS Take 20 mg by mouth daily with breakfast.     magnesium oxide (MAG-OX) 400 MG tablet Take 1 tablet (400 mg total) by mouth daily. 30 tablet 0   Melatonin 10 MG TABS Take 10-40 mg by mouth at bedtime as needed (sleep).     metoprolol succinate (TOPROL-XL) 50 MG 24 hr tablet TAKE TWO TABLETS BY MOUTH WITH OR FOLLOWING A MEAL THREE TIMES DAILY 540 tablet 2   Multiple Vitamin (MULTIVITAMIN WITH MINERALS) TABS tablet Take 1 tablet by mouth daily with breakfast.      Multiple Vitamins-Minerals (PRESERVISION AREDS 2 PO) Take 1 capsule by mouth 2 (two) times daily with a meal.      potassium chloride (KLOR-CON) 10 MEQ tablet Take 1 tablet (10 mEq total) by mouth daily. 90 tablet 3   sertraline (ZOLOFT) 50 MG tablet Take 1 tablet (50 mg total) by mouth daily. 30 tablet 2   No current facility-administered medications for this visit.    Allergies  Allergen Reactions   Citalopram Hydrobromide Other (See Comments)    Other reaction(s): QT prolongation with Tikosyn   Adhesive [Tape] Swelling and Rash   Bacitracin-Polymyxin B Other (See Comments)    Other reaction(s): Unknown   Benzalkonium Chloride Rash    Pt was not aware of this allergy   Neosporin [Neomycin-Polymyxin-Gramicidin] Swelling and Rash    Review of Systems negative except from HPI and PMH  Physical Exam: BP 128/74   Pulse 77    Ht 5\' 8"  (1.727 m)   Wt 211 lb 3.2 oz (95.8 kg)   LMP 07/22/1968   SpO2 95%   BMI 32.11 kg/m  Well developed and well nourished in no acute distress HENT normal Neck supple with JVP-flat Clear Device pocket well healed; without hematoma or erythema.  There is no tethering  Regular rate and rhythm, no  murmur Abd-soft with active BS No Clubbing cyanosis   edema Skin-warm and dry A & Oriented  Grossly normal sensory and motor function  ECG atrial fibrillation with ventricular pacing at 77 Intervals-/18/47  Device function is \\Fnormal . Programming changes MVP--VVIR See Paceart for details    ECG sinus at 85  intervals 14/09/41 Q waves V1 and V2 ECGs reviewed 2 and 3/23 there are small R waves in leads V1 and V2, suspect lead placement there  Assessment and  Plan Atrial fibrillation-permanent  HFpEF   Pacemaker-Medtronic    Hypertension  Patient is euvolemic.  Will continue her furosemide 80 mg daily, she would like to try every other day.  Will do that.  She also wondered about stopping her Marcelline Deist but we will continue that.  I was looking for the number of the absolute reduction in heart failure hospitalizations give her some guidance.  Her husband is encouraging her to continue.  I agree.  Especially given the improvement in her functional status.  Her blood pressure is better today.  But mostly it has been higher.  Will continue her regime.  I was caught off guard by her metoprolol regime.  Taking 100 mg 3 times a day.  Going back through the records, I found out that I had written to increase it from 100-150 twice daily and that that has been translated into 100 3 times daily.  No worries, she tolerates it well.

## 2022-12-01 ENCOUNTER — Emergency Department (HOSPITAL_COMMUNITY): Payer: Medicare Other

## 2022-12-01 ENCOUNTER — Inpatient Hospital Stay (HOSPITAL_COMMUNITY)
Admission: EM | Admit: 2022-12-01 | Discharge: 2022-12-03 | DRG: 083 | Disposition: A | Discharge status: Home / Self Care | Payer: Medicare Other | Attending: Internal Medicine | Admitting: Internal Medicine

## 2022-12-01 ENCOUNTER — Encounter (HOSPITAL_COMMUNITY): Payer: Self-pay

## 2022-12-01 ENCOUNTER — Other Ambulatory Visit: Payer: Self-pay

## 2022-12-01 DIAGNOSIS — Z961 Presence of intraocular lens: Secondary | ICD-10-CM | POA: Diagnosis present

## 2022-12-01 DIAGNOSIS — Z79899 Other long term (current) drug therapy: Secondary | ICD-10-CM

## 2022-12-01 DIAGNOSIS — E785 Hyperlipidemia, unspecified: Secondary | ICD-10-CM | POA: Diagnosis present

## 2022-12-01 DIAGNOSIS — I739 Peripheral vascular disease, unspecified: Secondary | ICD-10-CM | POA: Diagnosis present

## 2022-12-01 DIAGNOSIS — Z8249 Family history of ischemic heart disease and other diseases of the circulatory system: Secondary | ICD-10-CM

## 2022-12-01 DIAGNOSIS — R55 Syncope and collapse: Secondary | ICD-10-CM | POA: Diagnosis not present

## 2022-12-01 DIAGNOSIS — S0990XA Unspecified injury of head, initial encounter: Secondary | ICD-10-CM | POA: Diagnosis not present

## 2022-12-01 DIAGNOSIS — Z7901 Long term (current) use of anticoagulants: Secondary | ICD-10-CM

## 2022-12-01 DIAGNOSIS — Z8781 Personal history of (healed) traumatic fracture: Secondary | ICD-10-CM

## 2022-12-01 DIAGNOSIS — W19XXXA Unspecified fall, initial encounter: Secondary | ICD-10-CM | POA: Diagnosis not present

## 2022-12-01 DIAGNOSIS — M810 Age-related osteoporosis without current pathological fracture: Secondary | ICD-10-CM | POA: Diagnosis present

## 2022-12-01 DIAGNOSIS — Z9841 Cataract extraction status, right eye: Secondary | ICD-10-CM

## 2022-12-01 DIAGNOSIS — S199XXA Unspecified injury of neck, initial encounter: Secondary | ICD-10-CM | POA: Diagnosis not present

## 2022-12-01 DIAGNOSIS — Z79890 Hormone replacement therapy (postmenopausal): Secondary | ICD-10-CM

## 2022-12-01 DIAGNOSIS — I5032 Chronic diastolic (congestive) heart failure: Secondary | ICD-10-CM | POA: Insufficient documentation

## 2022-12-01 DIAGNOSIS — Z87891 Personal history of nicotine dependence: Secondary | ICD-10-CM

## 2022-12-01 DIAGNOSIS — Z9842 Cataract extraction status, left eye: Secondary | ICD-10-CM

## 2022-12-01 DIAGNOSIS — Z96653 Presence of artificial knee joint, bilateral: Secondary | ICD-10-CM | POA: Diagnosis present

## 2022-12-01 DIAGNOSIS — I495 Sick sinus syndrome: Secondary | ICD-10-CM | POA: Diagnosis present

## 2022-12-01 DIAGNOSIS — Z91048 Other nonmedicinal substance allergy status: Secondary | ICD-10-CM

## 2022-12-01 DIAGNOSIS — Z90710 Acquired absence of both cervix and uterus: Secondary | ICD-10-CM

## 2022-12-01 DIAGNOSIS — E039 Hypothyroidism, unspecified: Secondary | ICD-10-CM | POA: Diagnosis present

## 2022-12-01 DIAGNOSIS — R000 Tachycardia, unspecified: Secondary | ICD-10-CM | POA: Diagnosis not present

## 2022-12-01 DIAGNOSIS — Z800 Family history of malignant neoplasm of digestive organs: Secondary | ICD-10-CM

## 2022-12-01 DIAGNOSIS — I4891 Unspecified atrial fibrillation: Secondary | ICD-10-CM | POA: Diagnosis present

## 2022-12-01 DIAGNOSIS — I4819 Other persistent atrial fibrillation: Secondary | ICD-10-CM

## 2022-12-01 DIAGNOSIS — K219 Gastro-esophageal reflux disease without esophagitis: Secondary | ICD-10-CM | POA: Diagnosis present

## 2022-12-01 DIAGNOSIS — Z9049 Acquired absence of other specified parts of digestive tract: Secondary | ICD-10-CM

## 2022-12-01 DIAGNOSIS — Z66 Do not resuscitate: Secondary | ICD-10-CM | POA: Diagnosis present

## 2022-12-01 DIAGNOSIS — I4821 Permanent atrial fibrillation: Secondary | ICD-10-CM | POA: Diagnosis present

## 2022-12-01 DIAGNOSIS — I110 Hypertensive heart disease with heart failure: Secondary | ICD-10-CM | POA: Diagnosis present

## 2022-12-01 DIAGNOSIS — S065X0A Traumatic subdural hemorrhage without loss of consciousness, initial encounter: Secondary | ICD-10-CM | POA: Diagnosis not present

## 2022-12-01 DIAGNOSIS — Z888 Allergy status to other drugs, medicaments and biological substances status: Secondary | ICD-10-CM

## 2022-12-01 DIAGNOSIS — S065XAA Traumatic subdural hemorrhage with loss of consciousness status unknown, initial encounter: Secondary | ICD-10-CM | POA: Diagnosis not present

## 2022-12-01 DIAGNOSIS — Z950 Presence of cardiac pacemaker: Secondary | ICD-10-CM

## 2022-12-01 DIAGNOSIS — Z7983 Long term (current) use of bisphosphonates: Secondary | ICD-10-CM

## 2022-12-01 DIAGNOSIS — G4733 Obstructive sleep apnea (adult) (pediatric): Secondary | ICD-10-CM | POA: Diagnosis present

## 2022-12-01 DIAGNOSIS — Z96641 Presence of right artificial hip joint: Secondary | ICD-10-CM | POA: Diagnosis present

## 2022-12-01 DIAGNOSIS — S299XXA Unspecified injury of thorax, initial encounter: Secondary | ICD-10-CM | POA: Diagnosis not present

## 2022-12-01 DIAGNOSIS — E876 Hypokalemia: Secondary | ICD-10-CM

## 2022-12-01 DIAGNOSIS — H3530 Unspecified macular degeneration: Secondary | ICD-10-CM | POA: Diagnosis present

## 2022-12-01 DIAGNOSIS — Y92009 Unspecified place in unspecified non-institutional (private) residence as the place of occurrence of the external cause: Secondary | ICD-10-CM

## 2022-12-01 DIAGNOSIS — Z8719 Personal history of other diseases of the digestive system: Secondary | ICD-10-CM

## 2022-12-01 DIAGNOSIS — M25531 Pain in right wrist: Secondary | ICD-10-CM | POA: Diagnosis present

## 2022-12-01 DIAGNOSIS — Z8673 Personal history of transient ischemic attack (TIA), and cerebral infarction without residual deficits: Secondary | ICD-10-CM

## 2022-12-01 LAB — TROPONIN I (HIGH SENSITIVITY)
Troponin I (High Sensitivity): 11 ng/L (ref <18)
Troponin I (High Sensitivity): 12 ng/L (ref <18)

## 2022-12-01 LAB — MAGNESIUM: Magnesium: 2.1 mg/dL (ref 1.7–2.4)

## 2022-12-01 LAB — CBC WITH DIFFERENTIAL/PLATELET
Abs Immature Granulocytes: 0.04 10*3/uL (ref 0.00–0.07)
Basophils Absolute: 0 10*3/uL (ref 0.0–0.1)
Basophils Relative: 1 %
Eosinophils Absolute: 0.2 10*3/uL (ref 0.0–0.5)
Eosinophils Relative: 3 %
HCT: 44 % (ref 36.0–46.0)
Hemoglobin: 14.2 g/dL (ref 12.0–15.0)
Immature Granulocytes: 1 %
Lymphocytes Relative: 22 %
Lymphs Abs: 1.3 10*3/uL (ref 0.7–4.0)
MCH: 31.6 pg (ref 26.0–34.0)
MCHC: 32.3 g/dL (ref 30.0–36.0)
MCV: 97.8 fL (ref 80.0–100.0)
Monocytes Absolute: 0.9 10*3/uL (ref 0.1–1.0)
Monocytes Relative: 15 %
Neutro Abs: 3.5 10*3/uL (ref 1.7–7.7)
Neutrophils Relative %: 58 %
Platelets: 230 10*3/uL (ref 150–400)
RBC: 4.5 MIL/uL (ref 3.87–5.11)
RDW: 13.7 % (ref 11.5–15.5)
WBC: 5.9 10*3/uL (ref 4.0–10.5)
nRBC: 0 % (ref 0.0–0.2)

## 2022-12-01 LAB — COMPREHENSIVE METABOLIC PANEL
ALT: 28 U/L (ref 0–44)
AST: 37 U/L (ref 15–41)
Albumin: 4.3 g/dL (ref 3.5–5.0)
Alkaline Phosphatase: 92 U/L (ref 38–126)
Anion gap: 16 — ABNORMAL HIGH (ref 5–15)
BUN: 16 mg/dL (ref 8–23)
CO2: 23 mmol/L (ref 22–32)
Calcium: 10.7 mg/dL — ABNORMAL HIGH (ref 8.9–10.3)
Chloride: 97 mmol/L — ABNORMAL LOW (ref 98–111)
Creatinine, Ser: 0.87 mg/dL (ref 0.44–1.00)
GFR, Estimated: >60 mL/min (ref >60)
Glucose, Bld: 123 mg/dL — ABNORMAL HIGH (ref 70–99)
Potassium: 3.3 mmol/L — ABNORMAL LOW (ref 3.5–5.1)
Sodium: 136 mmol/L (ref 135–145)
Total Bilirubin: 0.6 mg/dL (ref 0.3–1.2)
Total Protein: 7.4 g/dL (ref 6.5–8.1)

## 2022-12-01 LAB — I-STAT CHEM 8, ED
BUN: 17 mg/dL (ref 8–23)
Calcium, Ion: 1.22 mmol/L (ref 1.15–1.40)
Chloride: 100 mmol/L (ref 98–111)
Creatinine, Ser: 1.1 mg/dL — ABNORMAL HIGH (ref 0.44–1.00)
Glucose, Bld: 119 mg/dL — ABNORMAL HIGH (ref 70–99)
HCT: 45 % (ref 36.0–46.0)
Hemoglobin: 15.3 g/dL — ABNORMAL HIGH (ref 12.0–15.0)
Potassium: 3.3 mmol/L — ABNORMAL LOW (ref 3.5–5.1)
Sodium: 138 mmol/L (ref 135–145)
TCO2: 25 mmol/L (ref 22–32)

## 2022-12-01 LAB — BRAIN NATRIURETIC PEPTIDE: B Natriuretic Peptide: 435.6 pg/mL — ABNORMAL HIGH (ref 0.0–100.0)

## 2022-12-01 NOTE — ED Triage Notes (Signed)
Patient BIB [facility] from home after fall with syncopal episode. Husband found her afterwards and patient was unresponsive initially and EMS reports repetitive questioning en route and continues on arrival. Patient is on thinners. Reports no pain except for right wrist, VSS at this time. CBG 136

## 2022-12-01 NOTE — TOC CAGE-AID Note (Signed)
Transition of Care [facility]) - CAGE-AID Screening   Patient Details  Name: Brittany [name] MRN: [id number] Date of Birth: 1933-10-30  Transition of Care (TOC) CM/SW Contact:    Katha Hamming, RN Phone [phone] 12/01/2022, 11:22 PM    CAGE-AID Screening:    Have You Ever Felt You Ought to Cut Down on Your Drinking or Drug Use?: No Have People Annoyed You By Critizing Your Drinking Or Drug Use?: No Have You Felt Bad Or Guilty About Your Drinking Or Drug Use?: No Have You Ever Had a Drink or Used Drugs First Thing In The Morning to Steady Your Nerves or to Get Rid of a Hangover?: No CAGE-AID Score: 0  Substance Abuse Education Offered: No (two glasses of wine a night)

## 2022-12-01 NOTE — H&P (Signed)
History and Physical    Brittany [name]:[id number] DOB: 1933/08/08 DOA: 12/01/2022  PCP: Deatra James, MD  Patient coming from: {Blank single:19197::"Home","SNF","ALF/ILF","Group Home","BHH","CIR","Hospice","Homeless","[facility] ED","[facility] ED","Outside Hospital","***"}  Chief Complaint: ***  HPI: Brittany [name] is a 89 y.o. female with medical history significant of ***   Patient states she was walking to her bathroom using her walker and then the next thing she remembers is being in the hospital although she does not remember going for CT scan of her head.  She denies preceding lightheadedness/dizziness, chest pain, palpitations, or shortness of breath.  Patient states she has been in her usual state of health otherwise and has not had any fevers, cough, nausea, vomiting, abdominal pain, diarrhea, or any urinary symptoms.  She takes Eliquis due to history of A-fib and prior stroke and last dose of Eliquis was around 8:30 AM this morning.  She denies any headaches.  Per husband at bedside, patient's son had called to wish her for Mother's Day and she did not pick up the phone.  Husband was in another room and when he went to check on her, she was found on the floor conscious but confused and did not know what had happened.  She does not have history of seizures.  Also patient denies history of blood clots.  ED Course: ***  Review of Systems:  Review of Systems  All other systems reviewed and are negative.   Past Medical History:  Diagnosis Date   Ankle fracture 2016   Arthritis    Atrial fibrillation (HCC)    Depression    Dysrhythmia    Elevated parathyroid hormone    Fracture of orbital floor with routine healing    GERD (gastroesophageal reflux disease)    History of colon polyps    benign   Hypercalcemia    Hypertension    Hyponatremia    Hypothyroid    Macular degeneration    wet in the right and dry in the left   OSA on CPAP    Osteoporosis    Peripheral vascular disease  (HCC)    Presence of permanent cardiac pacemaker    PSVT (paroxysmal supraventricular tachycardia)    Rotator cuff tear    Sinus node dysfunction (HCC)    a. s/p MDT pacemaker   Stroke [facility])    Urinary incontinence    Wrist fracture 2018    Past Surgical History:  Procedure Laterality Date   BACK SURGERY     CARDIOVERSION N/A 05/02/2022   Procedure: CARDIOVERSION;  Surgeon: Chrystie Nose, MD;  Location: [facility] ENDOSCOPY;  Service: Cardiovascular;  Laterality: N/A;   CATARACT EXTRACTION     CATARACT EXTRACTION W/ INTRAOCULAR LENS  IMPLANT, BILATERAL Bilateral    COLONOSCOPY     ESOPHAGOGASTRODUODENOSCOPY     IR ANGIO INTRA EXTRACRAN SEL COM CAROTID INNOMINATE BILAT MOD SED  12/30/2017   IR ANGIO VERTEBRAL SEL VERTEBRAL BILAT MOD SED  12/30/2017   LAPAROSCOPIC CHOLECYSTECTOMY  08/11/1999   LUMBAR DISC SURGERY  02/19/2014   ORIF ANKLE FRACTURE Left 04/13/2015   Procedure: OPEN REDUCTION INTERNAL FIXATION (ORIF) LEFT ANKLE FRACTURE;  Surgeon: Cammy Copa, MD;  Location: [facility] OR;  Service: Orthopedics;  Laterality: Left;   PACEMAKER PLACEMENT Right 07/22/2010   a. MDT dual chamber PPM implanted by Dr Graciela Husbands    [facility] GENERATOR CHANGEOUT N/A 08/22/2022   Procedure: PPM GENERATOR Janeann Merl;  Surgeon: Duke Salvia, MD;  Location: [facility] INVASIVE CV LAB;  Service: Cardiovascular;  Laterality: N/A;  SHOULDER ARTHROSCOPY W/ ROTATOR CUFF REPAIR Right 08/30/2014   WITH MINI-OPEN ROTATOR CUFF REPAIR AND SUBACROMIAL DECOMPRESSION   SHOULDER ARTHROSCOPY WITH ROTATOR CUFF REPAIR AND SUBACROMIAL DECOMPRESSION Right 08/30/2014   Procedure: SHOULDER ARTHROSCOPY WITH MINI-OPEN ROTATOR CUFF REPAIR AND SUBACROMIAL DECOMPRESSION;  Surgeon: Cammy Copa, MD;  Location: [facility] OR;  Service: Orthopedics;  Laterality: Right;  RIGHT SHOULDER DIAGNOSTIC OPERATIVE ARTHROSCOPY, SUBACROMIAL DECOMPRESSION, MINI-OPEN ROTATOR CUFF REPAIR.   TOTAL HIP ARTHROPLASTY Right 08/14/2021   Procedure: TOTAL HIP ARTHROPLASTY  ANTERIOR APPROACH;  Surgeon: Marcene Corning, MD;  Location: [facility] ORS;  Service: Orthopedics;  Laterality: Right;   TOTAL KNEE ARTHROPLASTY Left 02/13/2021   Procedure: LEFT TOTAL KNEE ARTHROPLASTY;  Surgeon: Marcene Corning, MD;  Location: [facility] ORS;  Service: Orthopedics;  Laterality: Left;   VAGINAL HYSTERECTOMY  07/22/1968   WRIST FRACTURE SURGERY Left      reports that she quit smoking about 32 years ago. Her smoking use included cigarettes. She has a 45.00 pack-year smoking history. She has never used smokeless tobacco. She reports current alcohol use of about 28.0 standard drinks of alcohol per week. She reports that she does not use drugs.  Allergies  Allergen Reactions   Citalopram Hydrobromide Other (See Comments)    Other reaction(s): QT prolongation with Tikosyn   Adhesive [Tape] Swelling and Rash   Bacitracin-Polymyxin B Other (See Comments)    Other reaction(s): Unknown   Benzalkonium Chloride Rash    Pt was not aware of this allergy   Neosporin [Neomycin-Polymyxin-Gramicidin] Swelling and Rash    Family History  Problem Relation Age of Onset   Colon cancer Mother        colon   Cancer Father        unknown type   Stroke Sister    Skin cancer Brother    Heart disease Brother    Breast cancer Paternal Aunt    Asthma Brother     Prior to Admission medications   Medication Sig Start Date End Date Taking? Authorizing Provider  acetaminophen (TYLENOL) 500 MG tablet Take 500 mg by mouth 2 (two) times daily.    [provider]  alendronate (FOSAMAX) 70 MG tablet Take 1 tablet (70 mg total) by mouth every 7 (seven) days. Take with a full glass of water on an empty stomach. 03/04/17   Kuneff, Renee A, DO  apixaban (ELIQUIS) 5 MG TABS tablet Take 1 tablet (5 mg total) by mouth 2 (two) times daily. 03/06/21   Graciella Freer, PA-C  atorvastatin (LIPITOR) 40 MG tablet Take 40 mg by mouth daily.    [provider]  B Complex-C (B-COMPLEX WITH VITAMIN C)  tablet Take 1 tablet by mouth daily with lunch.     [provider]  Cholecalciferol (VITAMIN D3) 125 MCG (5000 UT) TABS Take 10,000 Units by mouth daily with breakfast.    [provider]  dapagliflozin propanediol (FARXIGA) 10 MG TABS tablet Take 1 tablet (10 mg total) by mouth daily before breakfast. 02/18/22   Sheilah Pigeon, PA-C  diltiazem (CARTIA XT) 120 MG 24 hr capsule TAKE 1 CAPSULE BY MOUTH EVERY MORNING AND 1 CAPSULE DAILY AT BEDTIME 02/18/22   Duke Salvia, MD  furosemide (LASIX) 40 MG tablet Take 2 tablets (80 mg total) by mouth every other day. 11/29/22   Duke Salvia, MD  levothyroxine (SYNTHROID) 88 MCG tablet Take 88 mcg by mouth daily before breakfast.    [provider]  lisinopril (ZESTRIL) 10 MG tablet Take 1  tablet (10 mg total) by mouth daily. 01/23/22   Nahser, Deloris Ping, MD  Lutein 20 MG TABS Take 20 mg by mouth daily with breakfast.    [provider]  magnesium oxide (MAG-OX) 400 MG tablet Take 1 tablet (400 mg total) by mouth daily. 03/21/14   Duke Salvia, MD  Melatonin 10 MG TABS Take 10-40 mg by mouth at bedtime as needed (sleep).    [provider]  metoprolol succinate (TOPROL-XL) 50 MG 24 hr tablet TAKE TWO TABLETS BY MOUTH WITH OR FOLLOWING A MEAL THREE TIMES DAILY 10/03/22   Duke Salvia, MD  Multiple Vitamin (MULTIVITAMIN WITH MINERALS) TABS tablet Take 1 tablet by mouth daily with breakfast.     [provider]  Multiple Vitamins-Minerals (PRESERVISION AREDS 2 PO) Take 1 capsule by mouth 2 (two) times daily with a meal.     [provider]  potassium chloride (KLOR-CON) 10 MEQ tablet Take 1 tablet (10 mEq total) by mouth daily. 09/19/22   Duke Salvia, MD  sertraline (ZOLOFT) 50 MG tablet Take 1 tablet (50 mg total) by mouth daily. 03/22/19 08/21/22  Newman Nip, NP    Physical Exam: Vitals:   12/01/22 2040 12/01/22 2042 12/01/22 2135 12/01/22 2230  BP: 128/65  135/70 (!) 144/69   Pulse: 62  (!) 57 64  Resp: 18  19 15   Temp:      TempSrc:      SpO2: 100% 95% 96% 99%  Weight:      Height:        Physical Exam Vitals reviewed.  Constitutional:      General: She is not in acute distress. HENT:     Head: Normocephalic and atraumatic.  Eyes:     Extraocular Movements: Extraocular movements intact.  Cardiovascular:     Rate and Rhythm: Normal rate and regular rhythm.     Pulses: Normal pulses.  Pulmonary:     Effort: Pulmonary effort is normal. No respiratory distress.     Breath sounds: Normal breath sounds. No wheezing or rales.  Abdominal:     General: Bowel sounds are normal. There is no distension.     Palpations: Abdomen is soft.     Tenderness: There is no abdominal tenderness.  Musculoskeletal:     Cervical back: Normal range of motion.     Right lower leg: No edema.     Left lower leg: No edema.  Skin:    General: Skin is warm and dry.  Neurological:     General: No focal deficit present.     Mental Status: She is alert and oriented to person, place, and time.     Cranial Nerves: No cranial nerve deficit.     Sensory: No sensory deficit.     Motor: No weakness.     Labs on Admission: I have personally reviewed following labs and imaging studies  CBC: Recent Labs  Lab 12/01/22 2031 12/01/22 2040  WBC 5.9  --   NEUTROABS 3.5  --   HGB 14.2 15.3*  HCT 44.0 45.0  MCV 97.8  --   PLT 230  --    Basic Metabolic Panel: Recent Labs  Lab 12/01/22 2031 12/01/22 2040  NA 136 138  K 3.3* 3.3*  CL 97* 100  CO2 23  --   GLUCOSE 123* 119*  BUN 16 17  CREATININE 0.87 1.10*  CALCIUM 10.7*  --   MG 2.1  --    GFR: Estimated Creatinine  Clearance: 44.5 mL/min (A) (by C-G formula based on SCr of 1.1 mg/dL (H)). Liver Function Tests: Recent Labs  Lab 12/01/22 2031  AST 37  ALT 28  ALKPHOS 92  BILITOT 0.6  PROT 7.4  ALBUMIN 4.3   No results for input(s): "LIPASE", "AMYLASE" in the last 168 hours. No results for input(s):  "AMMONIA" in the last 168 hours. Coagulation Profile: No results for input(s): "INR", "PROTIME" in the last 168 hours. Cardiac Enzymes: No results for input(s): "CKTOTAL", "CKMB", "CKMBINDEX", "TROPONINI" in the last 168 hours. BNP (last 3 results) Recent Labs    12/21/21 1551 04/05/22 1120  PROBNP 1,081* 1,211*   HbA1C: No results for input(s): "HGBA1C" in the last 72 hours. CBG: No results for input(s): "GLUCAP" in the last 168 hours. Lipid Profile: No results for input(s): "CHOL", "HDL", "LDLCALC", "TRIG", "CHOLHDL", "LDLDIRECT" in the last 72 hours. Thyroid Function Tests: No results for input(s): "TSH", "T4TOTAL", "FREET4", "T3FREE", "THYROIDAB" in the last 72 hours. Anemia Panel: No results for input(s): "VITAMINB12", "FOLATE", "FERRITIN", "TIBC", "IRON", "RETICCTPCT" in the last 72 hours. Urine analysis:    Component Value Date/Time   COLORURINE STRAW (A) 02/08/2021 1350   APPEARANCEUR CLEAR 02/08/2021 1350   LABSPEC 1.008 02/08/2021 1350   PHURINE 7.0 02/08/2021 1350   GLUCOSEU NEGATIVE 02/08/2021 1350   HGBUR SMALL (A) 02/08/2021 1350   BILIRUBINUR NEGATIVE 02/08/2021 1350   KETONESUR NEGATIVE 02/08/2021 1350   PROTEINUR NEGATIVE 02/08/2021 1350   UROBILINOGEN TEST NOT PERFORMED 09/20/2013 1043   NITRITE NEGATIVE 02/08/2021 1350   LEUKOCYTESUR SMALL (A) 02/08/2021 1350    Radiological Exams on Admission: CT Cervical Spine Wo Contrast  Result Date: 12/01/2022 CLINICAL DATA:  Status post trauma. EXAM: CT CERVICAL SPINE WITHOUT CONTRAST TECHNIQUE: Multidetector CT imaging of the cervical spine was performed without intravenous contrast. Multiplanar CT image reconstructions were also generated. RADIATION DOSE REDUCTION: This exam was performed according to the departmental dose-optimization program which includes automated exposure control, adjustment of the mA and/or kV according to patient size and/or use of iterative reconstruction technique. COMPARISON:  August 17, 2013 FINDINGS: Alignment: Normal. Skull base and vertebrae: No acute fracture. Stable chronic and degenerative changes are seen involving the body and tip of the dens, as well as the adjacent portions of the anterior arch of C1. Soft tissues and spinal canal: No prevertebral fluid or swelling. No visible canal hematoma. Disc levels: Marked severity endplate sclerosis, anterior osteophyte formation and posterior bony spurring are seen at the levels of C3-C4, C4-C5, C5-C6 and C6-C7. Mild to moderate severity endplate sclerosis and anterior osteophyte formation is also seen at the level of C2-C3. There is moderate severity narrowing of the anterior atlantoaxial articulation. Marked severity intervertebral disc space narrowing is seen at C4-C5, C5-C6, C6-C7 and C7-T1. Mild intervertebral disc space narrowing is present throughout the remainder of the cervical spine. Bilateral marked severity multilevel facet joint hypertrophy is noted. Upper chest: Negative. Other: None. IMPRESSION: 1. No acute fracture or subluxation within the cervical spine. 2. Marked severity multilevel degenerative changes, as described above. Electronically Signed   By: Aram Candela M.D.   On: 12/01/2022 21:07   CT Head Wo Contrast  Result Date: 12/01/2022 CLINICAL DATA:  Status post trauma. EXAM: CT HEAD WITHOUT CONTRAST TECHNIQUE: Contiguous axial images were obtained from the base of the skull through the vertex without intravenous contrast. RADIATION DOSE REDUCTION: This exam was performed according to the departmental dose-optimization program which includes automated exposure control, adjustment of the mA and/or kV  according to patient size and/or use of iterative reconstruction technique. COMPARISON:  January 01, 2018 FINDINGS: Brain: There is moderate severity cerebral atrophy with widening of the extra-axial spaces and ventricular dilatation. There are areas of decreased attenuation within the white matter tracts of the  supratentorial brain, consistent with microvascular disease changes. A very small, 2 mm thick acute left parietal subdural bleed is seen (axial CT image 17, CT series 3). There is no evidence of associated mass effect or midline shift. Vascular: No hyperdense vessel or unexpected calcification. Skull: Normal. Negative for fracture or focal lesion. Sinuses/Orbits: No acute finding. Other: None. IMPRESSION: 1. Very small acute left parietal subdural hematoma. MRI correlation is recommended. 2. Moderate severity cerebral atrophy and microvascular disease changes of the supratentorial brain. Electronically Signed   By: Aram Candela M.D.   On: 12/01/2022 21:03   DG CHEST PORT 1 VIEW  Result Date: 12/01/2022 CLINICAL DATA:  [id number] Fall [id number] EXAM: PORTABLE CHEST 1 VIEW COMPARISON:  Chest x-ray 08/22/2021, CT chest 01/01/2016 FINDINGS: Left chest wall dual lead pacemaker in grossly similar position. Stable enlarged cardiac silhouette. Otherwise the heart and mediastinal contours are within normal limits. Aortic calcification. No focal consolidation. No pulmonary edema. No pleural effusion. No pneumothorax. No acute osseous abnormality. IMPRESSION: 1. No acute cardiopulmonary abnormality. 2.  Aortic Atherosclerosis (ICD10-I70.0). Electronically Signed   By: Tish Frederickson M.D.   On: 12/01/2022 20:48    EKG: Independently reviewed. ***  Assessment and Plan  DVT prophylaxis: {Blank single:19197::"Lovenox","SQ Heparin","IV heparin gtt","Xarelto","Eliquis","Coumadin","SCDs","***"} Code Status: {Blank single:19197::"Full Code","DNR","DNR/DNI","Comfort Care","***"} Family Communication: ***  Consults called: ***  Level of care: {Blank single:19197::"Med-Surg","Telemetry bed","Progressive Care Unit","Step Down Unit"} Admission status: *** Time Spent: 75+ minutes***  John Giovanni MD [facility]  If 7PM-7AM, please contact night-coverage [email or web address]  12/01/2022, 11:16 PM

## 2022-12-01 NOTE — ED Provider Notes (Signed)
[place] EMERGENCY DEPARTMENT AT [facility] Provider Note   CSN: [id number] Arrival date & time: 12/01/22  2023     History  Chief Complaint  Patient presents with   Loss of Consciousness    LVL 2    Brittany [name] is a 89 y.o. female.  89 year old female history of atrial fibrillation on Eliquis, sinus node dysfunction status post pacemaker placement February 1, hypertension presenting for syncope and fall.  Per husband, patient was walking with her walker when she fell forward.  She does not remove the fall, possibly syncopized.  She has been well.  No recent change in her medications, dizziness, chest pain, shortness of breath, fever, chills.  Currently she has been intermittently confused with EMS but hemodynamically stable.  She is on Eliquis which she last took this morning.  She has been eating and drinking normally.  She does not feel dizzy.  She says of the last couple weeks has occasionally had some chest pain.   Loss of Consciousness Associated symptoms: chest pain and confusion        Home Medications Prior to Admission medications   Medication Sig Start Date End Date Taking? Authorizing Provider  acetaminophen (TYLENOL) 500 MG tablet Take 500 mg by mouth 2 (two) times daily.    [provider]  alendronate (FOSAMAX) 70 MG tablet Take 1 tablet (70 mg total) by mouth every 7 (seven) days. Take with a full glass of water on an empty stomach. 03/04/17   Kuneff, Renee A, DO  apixaban (ELIQUIS) 5 MG TABS tablet Take 1 tablet (5 mg total) by mouth 2 (two) times daily. 03/06/21   Graciella Freer, PA-C  atorvastatin (LIPITOR) 40 MG tablet Take 40 mg by mouth daily.    [provider]  B Complex-C (B-COMPLEX WITH VITAMIN C) tablet Take 1 tablet by mouth daily with lunch.     [provider]  Cholecalciferol (VITAMIN D3) 125 MCG (5000 UT) TABS Take 10,000 Units by mouth daily with breakfast.    [provider]   dapagliflozin propanediol (FARXIGA) 10 MG TABS tablet Take 1 tablet (10 mg total) by mouth daily before breakfast. 02/18/22   Sheilah Pigeon, PA-C  diltiazem (CARTIA XT) 120 MG 24 hr capsule TAKE 1 CAPSULE BY MOUTH EVERY MORNING AND 1 CAPSULE DAILY AT BEDTIME 02/18/22   Duke Salvia, MD  furosemide (LASIX) 40 MG tablet Take 2 tablets (80 mg total) by mouth every other day. 11/29/22   Duke Salvia, MD  levothyroxine (SYNTHROID) 88 MCG tablet Take 88 mcg by mouth daily before breakfast.    [provider]  lisinopril (ZESTRIL) 10 MG tablet Take 1 tablet (10 mg total) by mouth daily. 01/23/22   Nahser, Deloris Ping, MD  Lutein 20 MG TABS Take 20 mg by mouth daily with breakfast.    [provider]  magnesium oxide (MAG-OX) 400 MG tablet Take 1 tablet (400 mg total) by mouth daily. 03/21/14   Duke Salvia, MD  Melatonin 10 MG TABS Take 10-40 mg by mouth at bedtime as needed (sleep).    [provider]  metoprolol succinate (TOPROL-XL) 50 MG 24 hr tablet TAKE TWO TABLETS BY MOUTH WITH OR FOLLOWING A MEAL THREE TIMES DAILY 10/03/22   Duke Salvia, MD  Multiple Vitamin (MULTIVITAMIN WITH MINERALS) TABS tablet Take 1 tablet by mouth daily with breakfast.     [provider]  Multiple Vitamins-Minerals (PRESERVISION AREDS 2 PO) Take 1 capsule  by mouth 2 (two) times daily with a meal.     [provider]  potassium chloride (KLOR-CON) 10 MEQ tablet Take 1 tablet (10 mEq total) by mouth daily. 09/19/22   Duke Salvia, MD  sertraline (ZOLOFT) 50 MG tablet Take 1 tablet (50 mg total) by mouth daily. 03/22/19 08/21/22  Newman Nip, NP      Allergies    Citalopram hydrobromide, Adhesive [tape], Bacitracin-polymyxin b, Benzalkonium chloride, and Neosporin [neomycin-polymyxin-gramicidin]    Review of Systems   Review of Systems  Cardiovascular:  Positive for chest pain and syncope.  Psychiatric/Behavioral:  Positive for confusion.   All other systems  reviewed and are negative.   Physical Exam Updated Vital Signs BP 104/70   Pulse 69   Temp 99.1 F (37.3 C) (Oral)   Resp 16   Ht 5\' 8"  (1.727 m)   Wt 95.8 kg   LMP 07/22/1968   SpO2 97%   BMI 32.11 kg/m  Physical Exam Vitals and nursing note reviewed.  Constitutional:      General: She is not in acute distress.    Appearance: She is well-developed.  HENT:     Head: Normocephalic and atraumatic.     Nose: Nose normal.     Mouth/Throat:     Mouth: Mucous membranes are moist.     Pharynx: Oropharynx is clear.  Eyes:     Extraocular Movements: Extraocular movements intact.     Conjunctiva/sclera: Conjunctivae normal.     Pupils: Pupils are equal, round, and reactive to light.  Cardiovascular:     Rate and Rhythm: Normal rate and regular rhythm.     Heart sounds: No murmur heard. Pulmonary:     Effort: Pulmonary effort is normal. No respiratory distress.     Breath sounds: Normal breath sounds.  Abdominal:     Palpations: Abdomen is soft.     Tenderness: There is no abdominal tenderness. There is no guarding or rebound.  Musculoskeletal:        General: No swelling or tenderness.     Cervical back: Normal range of motion and neck supple. No rigidity or tenderness.     Right lower leg: No edema.     Left lower leg: No edema.  Skin:    General: Skin is warm and dry.     Capillary Refill: Capillary refill takes less than 2 seconds.  Neurological:     General: No focal deficit present.     Mental Status: She is alert and oriented to person, place, and time. Mental status is at baseline.     Cranial Nerves: No cranial nerve deficit.     Sensory: No sensory deficit.     Motor: No weakness.  Psychiatric:        Mood and Affect: Mood normal.     ED Results / Procedures / Treatments   Labs (all labs ordered are listed, but only abnormal results are displayed) Labs Reviewed  COMPREHENSIVE METABOLIC PANEL - Abnormal; Notable for the following components:      Result  Value   Potassium 3.3 (*)    Chloride 97 (*)    Glucose, Bld 123 (*)    Calcium 10.7 (*)    Anion gap 16 (*)    All other components within normal limits  BRAIN NATRIURETIC PEPTIDE - Abnormal; Notable for the following components:   B Natriuretic Peptide 435.6 (*)    All other components within normal limits  I-STAT CHEM 8, ED - Abnormal;  Notable for the following components:   Potassium 3.3 (*)    Creatinine, Ser 1.10 (*)    Glucose, Bld 119 (*)    Hemoglobin 15.3 (*)    All other components within normal limits  CBC WITH DIFFERENTIAL/PLATELET  MAGNESIUM  TROPONIN I (HIGH SENSITIVITY)  TROPONIN I (HIGH SENSITIVITY)    EKG EKG Interpretation  Date/Time:  Sunday Dec 01 2022 20:37:47 EDT Ventricular Rate:  62 PR Interval:  82 QRS Duration: 230 QT Interval:  555 QTC Calculation: 564 R Axis:   -74 Text Interpretation: Sinus rhythm Short PR interval Left bundle branch block paced when compared to prior. No STEMI Confirmed by Theda Belfast ([id number]) on 12/01/2022 11:11:34 PM  Radiology CT Cervical Spine Wo Contrast  Result Date: 12/01/2022 CLINICAL DATA:  Status post trauma. EXAM: CT CERVICAL SPINE WITHOUT CONTRAST TECHNIQUE: Multidetector CT imaging of the cervical spine was performed without intravenous contrast. Multiplanar CT image reconstructions were also generated. RADIATION DOSE REDUCTION: This exam was performed according to the departmental dose-optimization program which includes automated exposure control, adjustment of the mA and/or kV according to patient size and/or use of iterative reconstruction technique. COMPARISON:  August 17, 2013 FINDINGS: Alignment: Normal. Skull base and vertebrae: No acute fracture. Stable chronic and degenerative changes are seen involving the body and tip of the dens, as well as the adjacent portions of the anterior arch of C1. Soft tissues and spinal canal: No prevertebral fluid or swelling. No visible canal hematoma. Disc levels: Marked  severity endplate sclerosis, anterior osteophyte formation and posterior bony spurring are seen at the levels of C3-C4, C4-C5, C5-C6 and C6-C7. Mild to moderate severity endplate sclerosis and anterior osteophyte formation is also seen at the level of C2-C3. There is moderate severity narrowing of the anterior atlantoaxial articulation. Marked severity intervertebral disc space narrowing is seen at C4-C5, C5-C6, C6-C7 and C7-T1. Mild intervertebral disc space narrowing is present throughout the remainder of the cervical spine. Bilateral marked severity multilevel facet joint hypertrophy is noted. Upper chest: Negative. Other: None. IMPRESSION: 1. No acute fracture or subluxation within the cervical spine. 2. Marked severity multilevel degenerative changes, as described above. Electronically Signed   By: Aram Candela M.D.   On: 12/01/2022 21:07   CT Head Wo Contrast  Result Date: 12/01/2022 CLINICAL DATA:  Status post trauma. EXAM: CT HEAD WITHOUT CONTRAST TECHNIQUE: Contiguous axial images were obtained from the base of the skull through the vertex without intravenous contrast. RADIATION DOSE REDUCTION: This exam was performed according to the departmental dose-optimization program which includes automated exposure control, adjustment of the mA and/or kV according to patient size and/or use of iterative reconstruction technique. COMPARISON:  January 01, 2018 FINDINGS: Brain: There is moderate severity cerebral atrophy with widening of the extra-axial spaces and ventricular dilatation. There are areas of decreased attenuation within the white matter tracts of the supratentorial brain, consistent with microvascular disease changes. A very small, 2 mm thick acute left parietal subdural bleed is seen (axial CT image 17, CT series 3). There is no evidence of associated mass effect or midline shift. Vascular: No hyperdense vessel or unexpected calcification. Skull: Normal. Negative for fracture or focal lesion.  Sinuses/Orbits: No acute finding. Other: None. IMPRESSION: 1. Very small acute left parietal subdural hematoma. MRI correlation is recommended. 2. Moderate severity cerebral atrophy and microvascular disease changes of the supratentorial brain. Electronically Signed   By: Aram Candela M.D.   On: 12/01/2022 21:03   DG CHEST PORT 1 VIEW  Result Date:  12/01/2022 CLINICAL DATA:  [id number] Fall [id number] EXAM: PORTABLE CHEST 1 VIEW COMPARISON:  Chest x-ray 08/22/2021, CT chest 01/01/2016 FINDINGS: Left chest wall dual lead pacemaker in grossly similar position. Stable enlarged cardiac silhouette. Otherwise the heart and mediastinal contours are within normal limits. Aortic calcification. No focal consolidation. No pulmonary edema. No pleural effusion. No pneumothorax. No acute osseous abnormality. IMPRESSION: 1. No acute cardiopulmonary abnormality. 2.  Aortic Atherosclerosis (ICD10-I70.0). Electronically Signed   By: Tish Frederickson M.D.   On: 12/01/2022 20:48    Procedures Procedures    Medications Ordered in ED Medications - No data to display  ED Course/ Medical Decision Making/ A&P Clinical Course as of 12/01/22 2329  Sun Dec 01, 2022  2049 Here EKG paced rhythm with left bundle branch morphology, negative for ischemia by Sgarbossa criteria, QTc prolonged at 564. [JD]    Clinical Course User Index [JD] Fulton Reek, MD                             Medical Decision Making Amount and/or Complexity of Data Reviewed Labs: ordered. Radiology: ordered.  Risk Decision regarding hospitalization.   89 year old female with history as above presenting for syncope and fall.  Patient presents as a level 2 trauma given anticoagulation.  On exam she has some repetitive questioning but no focal deficits, low concern for CVA.  I am concerned for syncope particular given her history.  Obtain CT head, cervical spine given mechanism as well as chest x-ray given history of syncope.  No other signs of  traumatic injury on exam.  Lab work reviewed, she has mild hypokalemia, slightly elevated anion gap, mild hypercalcemia.  CBC is unremarkable.  She has newly elevated BNP, last echo shows some diastolic dysfunction although appears euvolemic.  Troponin is negative.  No recent infectious symptoms and afebrile here.  She has had no recent change in medications.  We did interrogate her pacemaker which did not show any signs of arrhythmia or abnormality to explain her presumed syncope.  EKG does not show any ischemia and shows paced rhythm.  CT head reviewed, notable for small subdural hematoma.  CT cervical spine is unremarkable.  Discussed with Dr. Conchita Paris with neurosurgery.  They do not recommend reversal.  They recommend repeat CT head at 6 hours, consider reversal if worsening.  They recommend every 4 hours neurochecks, and holding Eliquis for at least 2 weeks.  Given primarily syncope concern, I discussed the patient with the hospitalist team and she was admitted for further management of her subdural hematoma and syncope.        Final Clinical Impression(s) / ED Diagnoses Final diagnoses:  Syncope, unspecified syncope type  Subdural hematoma [facility])    Rx / DC Orders ED Discharge Orders     None         Fulton Reek, MD 12/01/22 2329    Tegeler, Canary Brim, MD 12/02/22 [id number]

## 2022-12-01 NOTE — ED Notes (Addendum)
Trauma Response Nurse Documentation   Brittany [name] is a 89 y.o. female arriving to [facility] ED via EMS  On eliquis. Trauma was activated as a Level 2 by ED charge RN based on the following trauma criteria Elderly patients > 65 with head trauma on anti-coagulation (excluding ASA).  Patient cleared for CT by Dr. Rush Landmark EDP. Pt transported to CT with trauma response nurse present to monitor. RN remained with the patient throughout their absence from the department for clinical observation.   GCS 14 (E4V4M6) repetitive questioning, confused to events.  History   Past Medical History:  Diagnosis Date   Ankle fracture 2016   Arthritis    Atrial fibrillation (HCC)    Depression    Dysrhythmia    Elevated parathyroid hormone    Fracture of orbital floor with routine healing    GERD (gastroesophageal reflux disease)    History of colon polyps    benign   Hypercalcemia    Hypertension    Hyponatremia    Hypothyroid    Macular degeneration    wet in the right and dry in the left   OSA on CPAP    Osteoporosis    Peripheral vascular disease (HCC)    Presence of permanent cardiac pacemaker    PSVT (paroxysmal supraventricular tachycardia)    Rotator cuff tear    Sinus node dysfunction (HCC)    a. s/p MDT pacemaker   Stroke [facility])    Urinary incontinence    Wrist fracture 2018     Past Surgical History:  Procedure Laterality Date   BACK SURGERY     CARDIOVERSION N/A 05/02/2022   Procedure: CARDIOVERSION;  Surgeon: Chrystie Nose, MD;  Location: [facility] ENDOSCOPY;  Service: Cardiovascular;  Laterality: N/A;   CATARACT EXTRACTION     CATARACT EXTRACTION W/ INTRAOCULAR LENS  IMPLANT, BILATERAL Bilateral    COLONOSCOPY     ESOPHAGOGASTRODUODENOSCOPY     IR ANGIO INTRA EXTRACRAN SEL COM CAROTID INNOMINATE BILAT MOD SED  12/30/2017   IR ANGIO VERTEBRAL SEL VERTEBRAL BILAT MOD SED  12/30/2017   LAPAROSCOPIC CHOLECYSTECTOMY  08/11/1999   LUMBAR DISC SURGERY  02/19/2014   ORIF ANKLE  FRACTURE Left 04/13/2015   Procedure: OPEN REDUCTION INTERNAL FIXATION (ORIF) LEFT ANKLE FRACTURE;  Surgeon: Cammy Copa, MD;  Location: [facility] OR;  Service: Orthopedics;  Laterality: Left;   PACEMAKER PLACEMENT Right 07/22/2010   a. MDT dual chamber PPM implanted by Dr Graciela Husbands    [facility] GENERATOR CHANGEOUT N/A 08/22/2022   Procedure: PPM GENERATOR Janeann Merl;  Surgeon: Duke Salvia, MD;  Location: [facility] INVASIVE CV LAB;  Service: Cardiovascular;  Laterality: N/A;   SHOULDER ARTHROSCOPY W/ ROTATOR CUFF REPAIR Right 08/30/2014   WITH MINI-OPEN ROTATOR CUFF REPAIR AND SUBACROMIAL DECOMPRESSION   SHOULDER ARTHROSCOPY WITH ROTATOR CUFF REPAIR AND SUBACROMIAL DECOMPRESSION Right 08/30/2014   Procedure: SHOULDER ARTHROSCOPY WITH MINI-OPEN ROTATOR CUFF REPAIR AND SUBACROMIAL DECOMPRESSION;  Surgeon: Cammy Copa, MD;  Location: [facility] OR;  Service: Orthopedics;  Laterality: Right;  RIGHT SHOULDER DIAGNOSTIC OPERATIVE ARTHROSCOPY, SUBACROMIAL DECOMPRESSION, MINI-OPEN ROTATOR CUFF REPAIR.   TOTAL HIP ARTHROPLASTY Right 08/14/2021   Procedure: TOTAL HIP ARTHROPLASTY ANTERIOR APPROACH;  Surgeon: Marcene Corning, MD;  Location: [facility] ORS;  Service: Orthopedics;  Laterality: Right;   TOTAL KNEE ARTHROPLASTY Left 02/13/2021   Procedure: LEFT TOTAL KNEE ARTHROPLASTY;  Surgeon: Marcene Corning, MD;  Location: [facility] ORS;  Service: Orthopedics;  Laterality: Left;   VAGINAL HYSTERECTOMY  07/22/1968   WRIST FRACTURE SURGERY Left  Initial Focused Assessment (If applicable, or please see trauma documentation): Alert/confused female presents via EMS from her apartment after an unwitnessed fall, unresponsive when found. Complains of wrist pain and states she's had chest pain intermittently lately. No obvious injuries noted.  Airway patent/unobstructed, BS clear No obvious uncontrolled hemorrhage, BP stable GCS 14, repetitive questioning, PERRLA 4mm  CT's Completed:   CT Head and CT C-Spine   Interventions:  IV  start and trauma lab draw EKG Portable chest XRAY CT head/cervical spine Wrist XRAY Pacemaker interrogation  Plan for disposition:  Admit  Consults completed:  Neurosurgery [place] (Dr. Jordan Likes) paged at 2127  Event Summary: Presents via EMS from home after she was found down, unresponsive. Takes eliquis. Does not recall events of this evening, reports right wrist pain and has been complaining of chest pain lately. TRN escorted to CT following portable xrays. Spouse at bedside, updated. Admit for SDH.   Bedside handoff with Lyda Perone ED paramedic  Katha Hamming  Trauma Response RN  Please call TRN at [phone] for further assistance.

## 2022-12-01 NOTE — ED Notes (Signed)
Normal function, A-fib with atrial lead sensing. Pacemaker functioning properly per [employer].

## 2022-12-02 ENCOUNTER — Observation Stay (HOSPITAL_COMMUNITY): Payer: Medicare Other

## 2022-12-02 DIAGNOSIS — R55 Syncope and collapse: Secondary | ICD-10-CM

## 2022-12-02 DIAGNOSIS — I739 Peripheral vascular disease, unspecified: Secondary | ICD-10-CM | POA: Diagnosis present

## 2022-12-02 DIAGNOSIS — I6200 Nontraumatic subdural hemorrhage, unspecified: Secondary | ICD-10-CM | POA: Diagnosis not present

## 2022-12-02 DIAGNOSIS — I4891 Unspecified atrial fibrillation: Secondary | ICD-10-CM

## 2022-12-02 DIAGNOSIS — G4733 Obstructive sleep apnea (adult) (pediatric): Secondary | ICD-10-CM | POA: Diagnosis present

## 2022-12-02 DIAGNOSIS — Y92009 Unspecified place in unspecified non-institutional (private) residence as the place of occurrence of the external cause: Secondary | ICD-10-CM

## 2022-12-02 DIAGNOSIS — Z043 Encounter for examination and observation following other accident: Secondary | ICD-10-CM | POA: Diagnosis not present

## 2022-12-02 DIAGNOSIS — Z96653 Presence of artificial knee joint, bilateral: Secondary | ICD-10-CM | POA: Diagnosis present

## 2022-12-02 DIAGNOSIS — E876 Hypokalemia: Secondary | ICD-10-CM | POA: Diagnosis present

## 2022-12-02 DIAGNOSIS — W19XXXA Unspecified fall, initial encounter: Secondary | ICD-10-CM

## 2022-12-02 DIAGNOSIS — Z96641 Presence of right artificial hip joint: Secondary | ICD-10-CM | POA: Diagnosis present

## 2022-12-02 DIAGNOSIS — E039 Hypothyroidism, unspecified: Secondary | ICD-10-CM | POA: Diagnosis present

## 2022-12-02 DIAGNOSIS — R569 Unspecified convulsions: Secondary | ICD-10-CM

## 2022-12-02 DIAGNOSIS — Z950 Presence of cardiac pacemaker: Secondary | ICD-10-CM | POA: Diagnosis not present

## 2022-12-02 DIAGNOSIS — E785 Hyperlipidemia, unspecified: Secondary | ICD-10-CM | POA: Diagnosis present

## 2022-12-02 DIAGNOSIS — S065XAA Traumatic subdural hemorrhage with loss of consciousness status unknown, initial encounter: Secondary | ICD-10-CM | POA: Diagnosis present

## 2022-12-02 DIAGNOSIS — K219 Gastro-esophageal reflux disease without esophagitis: Secondary | ICD-10-CM | POA: Diagnosis present

## 2022-12-02 DIAGNOSIS — M810 Age-related osteoporosis without current pathological fracture: Secondary | ICD-10-CM | POA: Diagnosis present

## 2022-12-02 DIAGNOSIS — I4821 Permanent atrial fibrillation: Secondary | ICD-10-CM | POA: Diagnosis present

## 2022-12-02 DIAGNOSIS — Z961 Presence of intraocular lens: Secondary | ICD-10-CM | POA: Diagnosis present

## 2022-12-02 DIAGNOSIS — M25531 Pain in right wrist: Secondary | ICD-10-CM | POA: Diagnosis present

## 2022-12-02 DIAGNOSIS — Z79899 Other long term (current) drug therapy: Secondary | ICD-10-CM | POA: Diagnosis not present

## 2022-12-02 DIAGNOSIS — I110 Hypertensive heart disease with heart failure: Secondary | ICD-10-CM | POA: Diagnosis present

## 2022-12-02 DIAGNOSIS — Z7901 Long term (current) use of anticoagulants: Secondary | ICD-10-CM | POA: Diagnosis not present

## 2022-12-02 DIAGNOSIS — Z66 Do not resuscitate: Secondary | ICD-10-CM | POA: Diagnosis present

## 2022-12-02 DIAGNOSIS — Z79890 Hormone replacement therapy (postmenopausal): Secondary | ICD-10-CM | POA: Diagnosis not present

## 2022-12-02 DIAGNOSIS — Z8249 Family history of ischemic heart disease and other diseases of the circulatory system: Secondary | ICD-10-CM | POA: Diagnosis not present

## 2022-12-02 DIAGNOSIS — I5032 Chronic diastolic (congestive) heart failure: Secondary | ICD-10-CM | POA: Diagnosis present

## 2022-12-02 DIAGNOSIS — Z87891 Personal history of nicotine dependence: Secondary | ICD-10-CM | POA: Diagnosis not present

## 2022-12-02 DIAGNOSIS — H3530 Unspecified macular degeneration: Secondary | ICD-10-CM | POA: Diagnosis present

## 2022-12-02 LAB — ECHOCARDIOGRAM COMPLETE
AR max vel: 2.72 cm2
AV Area VTI: 2.73 cm2
AV Area mean vel: 2.65 cm2
AV Mean grad: 3.5 mmHg
AV Peak grad: 6 mmHg
AV Vena cont: 0.5 cm
Ao pk vel: 1.22 m/s
Area-P 1/2: 3.66 cm2
Height: 68 in
S' Lateral: 3.7 cm
Weight: 3379.21 oz

## 2022-12-02 LAB — BASIC METABOLIC PANEL
Anion gap: 8 (ref 5–15)
BUN: 11 mg/dL (ref 8–23)
CO2: 26 mmol/L (ref 22–32)
Calcium: 9.6 mg/dL (ref 8.9–10.3)
Chloride: 101 mmol/L (ref 98–111)
Creatinine, Ser: 0.7 mg/dL (ref 0.44–1.00)
GFR, Estimated: >60 mL/min (ref >60)
Glucose, Bld: 109 mg/dL — ABNORMAL HIGH (ref 70–99)
Potassium: 3.6 mmol/L (ref 3.5–5.1)
Sodium: 135 mmol/L (ref 135–145)

## 2022-12-02 LAB — TSH: TSH: 3.1 u[IU]/mL (ref 0.350–4.500)

## 2022-12-02 LAB — T4, FREE: Free T4: 0.94 ng/dL (ref 0.61–1.12)

## 2022-12-02 MED ORDER — ACETAMINOPHEN 325 MG PO TABS
650.0000 mg | ORAL_TABLET | Freq: Four times a day (QID) | ORAL | Status: DC | PRN
  Administered 2022-12-03: 650 mg via ORAL
  Filled 2022-12-02: qty 2

## 2022-12-02 MED ORDER — ATORVASTATIN CALCIUM 40 MG PO TABS
40.0000 mg | ORAL_TABLET | Freq: Every evening | ORAL | Status: DC
  Administered 2022-12-02: 40 mg via ORAL
  Filled 2022-12-02: qty 1

## 2022-12-02 MED ORDER — POTASSIUM CHLORIDE CRYS ER 20 MEQ PO TBCR
40.0000 meq | EXTENDED_RELEASE_TABLET | Freq: Once | ORAL | Status: AC
  Administered 2022-12-02: 40 meq via ORAL
  Filled 2022-12-02: qty 2

## 2022-12-02 MED ORDER — ACETAMINOPHEN 650 MG RE SUPP: 650.0000 mg | Freq: Four times a day (QID) | RECTAL | Status: DC | PRN

## 2022-12-02 MED ORDER — LEVOTHYROXINE SODIUM 88 MCG PO TABS
88.0000 ug | ORAL_TABLET | Freq: Every day | ORAL | Status: DC
  Administered 2022-12-02 – 2022-12-03 (×2): 88 ug via ORAL
  Filled 2022-12-02 (×2): qty 1

## 2022-12-02 NOTE — Assessment & Plan Note (Signed)
-   s/p fall and on Eliquis for permanent afib - per neurosurgery, hold Eliquis for 2 weeks - repeat CTH 6 hrs after initial shows stable SDH

## 2022-12-02 NOTE — Procedures (Signed)
Patient Name: Brittany [name]  MRN: [id number]  Epilepsy Attending: Charlsie Quest  Referring Physician/Provider: John Giovanni, MD  Date: 12/02/2022 Duration: 25.03 mins  Patient history: 89yo F with left parietal subdural hematoma. EEG to evaluate for seizure.  Level of alertness: Awake  AEDs during EEG study: None  Technical aspects: This EEG study was done with scalp electrodes positioned according to the 10-20 International system of electrode placement. Electrical activity was reviewed with band pass filter of 1-70Hz , sensitivity of 7 uV/mm, display speed of 72mm/sec with a 60Hz  notched filter applied as appropriate. EEG data were recorded continuously and digitally stored.  Video monitoring was available and reviewed as appropriate.  Description: The posterior dominant rhythm consists of 9 Hz activity of moderate voltage (25-35 uV) seen predominantly in posterior head regions, symmetric and reactive to eye opening and eye closing. Hyperventilation and photic stimulation were not performed.     IMPRESSION: This study is within normal limits. No seizures or epileptiform discharges were seen throughout the recording.  A normal interictal EEG does not exclude the diagnosis of epilepsy.  [name]

## 2022-12-02 NOTE — Progress Notes (Signed)
EEG complete - results pending 

## 2022-12-02 NOTE — Assessment & Plan Note (Signed)
-   Eliquis on hold - see SDH - holding cardizem and toprol for now too

## 2022-12-02 NOTE — Assessment & Plan Note (Signed)
-   CT cervical spine negative for fractures or acute findings; noted with multilevel degenerative changes - Right wrist pain noted; xray shows osteoarthritis

## 2022-12-02 NOTE — Assessment & Plan Note (Signed)
-   replete as needed 

## 2022-12-02 NOTE — Assessment & Plan Note (Addendum)
-   differential includes cardiogenic vs neurogenic.Has also been on Toprol 100mg  TID it appears and HR currently in 60s though she seems to be asymptomatic currently; has been noted to have some exertional SOB over the past month or so.  If not cardiogenic, does have moderate amount of cerebral atrophy and could therefore have contributed - EEG negative -Discussed with cardiology.  Pacemaker is functioning properly and echo also reassuring.  No medication changes recommended - orthostatic mildly positive from sitting to standing but recovers - patient recommended to check BP log at discharge; no med changes made

## 2022-12-02 NOTE — Assessment & Plan Note (Signed)
-   no s/s exacerbation - mild exertional SOB lately but also reported this at recent cardiology visit - no acute findings on echo

## 2022-12-02 NOTE — Progress Notes (Signed)
Progress Note    Brittany [name]   ZOX:[id number]  DOB: Oct 15, 1933  DOA: 12/01/2022     0 PCP: Deatra James, MD  Initial CC: Fall at home  [facility] Course: Ms. Denner is an 89 yo female with PMH permanent A-fib on Eliquis, sinus node dysfunction status post PPM, HFpEF, hypertension, hyperlipidemia, depression, GERD, hypothyroidism, OSA, PVD, stroke who presented to the ED via EMS from home for syncope and fall.   CT head showed small acute left parietal subdural hematoma and underlying moderate severity cerebral atrophy. ED provider discussed the case with Dr. Conchita Paris with neurosurgery who feels that there is no need for anticoagulation reversal as the area of bleeding is very small.  He recommended repeating head CT in 6 hours and neurochecks every 4 hours. Recommended holding Eliquis for the next 2 weeks.  Patient's pacemaker was interrogated in the ED and no acute events reported.  Repeat CT head showed stability and no evolution of hematoma.   Patient states she was walking to her bathroom using her walker and then the next thing she remembers is being in the hospital although she does not remember going for CT scan of her head.  She denies preceding lightheadedness/dizziness, chest pain, palpitations, or shortness of breath.  Patient states she has been in her usual state of health otherwise and has not had any fevers, cough, nausea, vomiting, abdominal pain, diarrhea, or any urinary symptoms.   She takes Eliquis due to history of A-fib and prior stroke. She denies any headaches.  Per husband at bedside, patient's son had called to wish her for Mother's Day and she did not pick up the phone.  Husband was in another room and when he went to check on her, she was found on the floor awake but confused and did not know what had happened.  She does not have history of seizures.  Also patient denies history of blood clots.  Interval History:  Seen this morning in her room with husband present  bedside.  EEG also started.  Denies any chest pain, palpitations, shortness of breath, dizziness. Understands we are pursuing further workup for her syncope.  She does not remember anything other than being in her "study room" then waking up in the hospital. Also complaining of pain in her right wrist from the fall.  Assessment and Plan: * Subdural hematoma (HCC) - s/p fall and on Eliquis for permanent afib - per neurosurgery, hold Eliquis for 2 weeks - repeat CTH 6 hrs after initial shows stable SDH  Syncope - differential includes cardiogenic vs neurogenic. Husband mentioning something about a pacer wire being nonfunctional? Has also been on Toprol 100mg  TID it appears and HR currently in 60s though she seems to be asymptomatic currently; has been noted to have some exertional SOB over the past month or so.  If not cardiogenic, does have moderate amount of cerebral atrophy and could therefore have contributed - follow up EEG - cardiology consulted for possible med adjustment; not sure if pacer has a mechanical/electrical problem?? - on cardizem 120 mg BID and Toprol 100mg  TID at home (on hold) - check orthostatics  - follow up echo  Fall at home, initial encounter - CT cervical spine negative for fractures or acute findings; noted with multilevel degenerative changes - Right wrist pain noted this morning, x-ray ordered to further evaluate  A-fib (HCC) - Eliquis on hold - see SDH - holding cardizem and toprol for now too  Hypokalemia - replete as needed  Chronic heart failure with preserved ejection fraction (HFpEF) (HCC) - no s/s exacerbation - mild exertional SOB lately but also reported this at recent cardiology visit - follow up echo   Old records reviewed in assessment of this patient  Antimicrobials:   DVT prophylaxis:  SCDs Start: 12/02/22 0055   Code Status:   Code Status: DNR  Mobility Assessment (last 72 hours)     Mobility Assessment     Row Name  12/02/22 0600 12/02/22 0500         Does patient have an order for bedrest or is patient medically unstable No - Continue assessment No - Continue assessment      What is the highest level of mobility based on the progressive mobility assessment? Level 4 (Walks with assist in room) - Balance while marching in place and cannot step forward and back - Complete Level 5 (Walks with assist in room/hall) - Balance while stepping forward/back and can walk in room with assist - Complete               Barriers to discharge: none Disposition Plan:  Home Status is: Obs  Objective: Blood pressure (!) 142/80, pulse 78, temperature 97.9 F (36.6 C), temperature source Oral, resp. rate 16, height 5\' 8"  (1.727 m), weight 95.8 kg, last menstrual period 07/22/1968, SpO2 95 %.  Examination:  Physical Exam Constitutional:      General: She is not in acute distress.    Appearance: Normal appearance. She is not ill-appearing.  HENT:     Head: Normocephalic and atraumatic.     Mouth/Throat:     Mouth: Mucous membranes are moist.  Eyes:     Extraocular Movements: Extraocular movements intact.  Cardiovascular:     Rate and Rhythm: Bradycardia present. Rhythm irregular.  Pulmonary:     Effort: Pulmonary effort is normal. No respiratory distress.     Breath sounds: Normal breath sounds. No wheezing.  Abdominal:     General: Bowel sounds are normal. There is no distension.     Palpations: Abdomen is soft.     Tenderness: There is no abdominal tenderness.  Musculoskeletal:        General: No swelling.     Cervical back: Normal range of motion and neck supple.     Comments: Decreased passive ROM in R wrist due to pain  Skin:    General: Skin is dry.  Neurological:     Mental Status: She is alert and oriented to person, place, and time.     Sensory: No sensory deficit.     Motor: No weakness.  Psychiatric:        Behavior: Behavior normal.      Consultants:  Cardiology  Procedures:     Data Reviewed: Results for orders placed or performed during the hospital encounter of 12/01/22 (from the past 24 hour(s))  CBC with Differential     Status: None   Collection Time: 12/01/22  8:31 PM  Result Value Ref Range   WBC 5.9 4.0 - 10.5 K/uL   RBC 4.50 3.87 - 5.11 MIL/uL   Hemoglobin 14.2 12.0 - 15.0 g/dL   HCT 16.1 09.6 - 04.5 %   MCV 97.8 80.0 - 100.0 fL   MCH 31.6 26.0 - 34.0 pg   MCHC 32.3 30.0 - 36.0 g/dL   RDW 40.9 81.1 - 91.4 %   Platelets 230 150 - 400 K/uL   nRBC 0.0 0.0 - 0.2 %   Neutrophils Relative % 58 %  Neutro Abs 3.5 1.7 - 7.7 K/uL   Lymphocytes Relative 22 %   Lymphs Abs 1.3 0.7 - 4.0 K/uL   Monocytes Relative 15 %   Monocytes Absolute 0.9 0.1 - 1.0 K/uL   Eosinophils Relative 3 %   Eosinophils Absolute 0.2 0.0 - 0.5 K/uL   Basophils Relative 1 %   Basophils Absolute 0.0 0.0 - 0.1 K/uL   Immature Granulocytes 1 %   Abs Immature Granulocytes 0.04 0.00 - 0.07 K/uL  Comprehensive metabolic panel     Status: Abnormal   Collection Time: 12/01/22  8:31 PM  Result Value Ref Range   Sodium 136 135 - 145 mmol/L   Potassium 3.3 (L) 3.5 - 5.1 mmol/L   Chloride 97 (L) 98 - 111 mmol/L   CO2 23 22 - 32 mmol/L   Glucose, Bld 123 (H) 70 - 99 mg/dL   BUN 16 8 - 23 mg/dL   Creatinine, Ser 1.61 0.44 - 1.00 mg/dL   Calcium 09.6 (H) 8.9 - 10.3 mg/dL   Total Protein 7.4 6.5 - 8.1 g/dL   Albumin 4.3 3.5 - 5.0 g/dL   AST 37 15 - 41 U/L   ALT 28 0 - 44 U/L   Alkaline Phosphatase 92 38 - 126 U/L   Total Bilirubin 0.6 0.3 - 1.2 mg/dL   GFR, Estimated >04 >54 mL/min   Anion gap 16 (H) 5 - 15  Magnesium     Status: None   Collection Time: 12/01/22  8:31 PM  Result Value Ref Range   Magnesium 2.1 1.7 - 2.4 mg/dL  Troponin I (High Sensitivity)     Status: None   Collection Time: 12/01/22  8:31 PM  Result Value Ref Range   Troponin I (High Sensitivity) 11 <18 ng/L  Brain natriuretic peptide     Status: Abnormal   Collection Time: 12/01/22  8:31 PM  Result Value  Ref Range   B Natriuretic Peptide 435.6 (H) 0.0 - 100.0 pg/mL  I-stat chem 8, ed     Status: Abnormal   Collection Time: 12/01/22  8:40 PM  Result Value Ref Range   Sodium 138 135 - 145 mmol/L   Potassium 3.3 (L) 3.5 - 5.1 mmol/L   Chloride 100 98 - 111 mmol/L   BUN 17 8 - 23 mg/dL   Creatinine, Ser 0.98 (H) 0.44 - 1.00 mg/dL   Glucose, Bld 119 (H) 70 - 99 mg/dL   Calcium, Ion 1.47 8.29 - 1.40 mmol/L   TCO2 25 22 - 32 mmol/L   Hemoglobin 15.3 (H) 12.0 - 15.0 g/dL   HCT 56.2 13.0 - 86.5 %  Troponin I (High Sensitivity)     Status: None   Collection Time: 12/01/22 10:29 PM  Result Value Ref Range   Troponin I (High Sensitivity) 12 <18 ng/L  Basic metabolic panel     Status: Abnormal   Collection Time: 12/02/22  2:55 AM  Result Value Ref Range   Sodium 135 135 - 145 mmol/L   Potassium 3.6 3.5 - 5.1 mmol/L   Chloride 101 98 - 111 mmol/L   CO2 26 22 - 32 mmol/L   Glucose, Bld 109 (H) 70 - 99 mg/dL   BUN 11 8 - 23 mg/dL   Creatinine, Ser 7.84 0.44 - 1.00 mg/dL   Calcium 9.6 8.9 - 69.6 mg/dL   GFR, Estimated >29 >52 mL/min   Anion gap 8 5 - 15    I have reviewed pertinent nursing notes, vitals, labs, and  images as necessary. I have ordered labwork to follow up on as indicated.  I have reviewed the last notes from staff over past 24 hours. I have discussed patient's care plan and test results with nursing staff, CM/SW, and other staff as appropriate.  Time spent: Greater than 50% of the 55 minute visit was spent in counseling/coordination of care for the patient as laid out in the A&P.   LOS: 0 days   Lewie Chamber, MD [facility] 12/02/2022, 12:06 PM

## 2022-12-02 NOTE — Hospital Course (Signed)
Brittany [name] is an 89 yo female with PMH permanent A-fib on Eliquis, sinus node dysfunction status post PPM, HFpEF, hypertension, hyperlipidemia, depression, GERD, hypothyroidism, OSA, PVD, stroke who presented to the ED via EMS from home for syncope and fall.   CT head showed small acute left parietal subdural hematoma and underlying moderate severity cerebral atrophy. ED provider discussed the case with Dr. Conchita Paris with neurosurgery who feels that there is no need for anticoagulation reversal as the area of bleeding is very small.  He recommended repeating head CT in 6 hours and neurochecks every 4 hours. Recommended holding Eliquis for the next 2 weeks.  Patient's pacemaker was interrogated in the ED and no acute events reported.  Repeat CT head showed stability and no evolution of hematoma.   Patient states she was walking to her bathroom using her walker and then the next thing she remembers is being in the hospital although she does not remember going for CT scan of her head.  She denies preceding lightheadedness/dizziness, chest pain, palpitations, or shortness of breath.  Patient states she has been in her usual state of health otherwise and has not had any fevers, cough, nausea, vomiting, abdominal pain, diarrhea, or any urinary symptoms.   She takes Eliquis due to history of A-fib and prior stroke. She denies any headaches.  Per husband at bedside, patient's son had called to wish her for Mother's Day and she did not pick up the phone.  Husband was in another room and when he went to check on her, she was found on the floor awake but confused and did not know what had happened.  She does not have history of seizures.  Also patient denies history of blood clots.

## 2022-12-02 NOTE — ED Notes (Signed)
ED TO INPATIENT HANDOFF REPORT  ED Nurse Name and Phone #: Grant Fontana PM [phone]  S Name/Age/Gender Brittany [name] 89 y.o. female Room/Bed: 018C/018C  Code Status   Code Status: DNR  Home/SNF/Other Home Patient oriented to: self, place, and time Is this baseline? No   Triage Complete: Triage complete  Chief Complaint Subdural hematoma (HCC) [S06.5XAA]  Triage Note Patient BIB [facility] from home after fall with syncopal episode. Husband found her afterwards and patient was unresponsive initially and EMS reports repetitive questioning en route and continues on arrival. Patient is on thinners. Reports no pain except for right wrist, VSS at this time. CBG 136   Allergies Allergies  Allergen Reactions   Citalopram Hydrobromide Other (See Comments)    Other reaction(s): QT prolongation with Tikosyn   Adhesive [Tape] Swelling and Rash   Bacitracin-Polymyxin B Other (See Comments)    Other reaction(s): Unknown   Benzalkonium Chloride Rash    Pt was not aware of this allergy   Neosporin [Neomycin-Polymyxin-Gramicidin] Swelling and Rash    Level of Care/Admitting Diagnosis ED Disposition     ED Disposition  Admit   Condition  --   Comment  Hospital Area: [facility] [[id number]]  Level of Care: Progressive [102]  Admit to Progressive based on following criteria: NEUROLOGICAL AND NEUROSURGICAL complex patients with significant risk of instability, who do not meet ICU criteria, yet require close observation or frequent assessment (< / = every 2 - 4 hours) with medical / nursing intervention.  Admit to Progressive based on following criteria: CARDIOVASCULAR & THORACIC of moderate stability with acute coronary syndrome symptoms/low risk myocardial infarction/hypertensive urgency/arrhythmias/heart failure potentially compromising stability and stable post cardiovascular intervention patients.  May place patient in observation at [facility] or [facility] if equivalent  level of care is available:: Yes  Covid Evaluation: Asymptomatic - no recent exposure (last 10 days) testing not required  Diagnosis: Subdural hematoma [facility]) [[id number]]  Admitting Physician: John Giovanni [[id number]]  Attending Physician: John Giovanni [[id number]]          B Medical/Surgery History Past Medical History:  Diagnosis Date   Ankle fracture 2016   Arthritis    Atrial fibrillation (HCC)    Depression    Dysrhythmia    Elevated parathyroid hormone    Fracture of orbital floor with routine healing    GERD (gastroesophageal reflux disease)    History of colon polyps    benign   Hypercalcemia    Hypertension    Hyponatremia    Hypothyroid    Macular degeneration    wet in the right and dry in the left   OSA on CPAP    Osteoporosis    Peripheral vascular disease (HCC)    Presence of permanent cardiac pacemaker    PSVT (paroxysmal supraventricular tachycardia)    Rotator cuff tear    Sinus node dysfunction (HCC)    a. s/p MDT pacemaker   Stroke [facility])    Urinary incontinence    Wrist fracture 2018   Past Surgical History:  Procedure Laterality Date   BACK SURGERY     CARDIOVERSION N/A 05/02/2022   Procedure: CARDIOVERSION;  Surgeon: Chrystie Nose, MD;  Location: [facility] ENDOSCOPY;  Service: Cardiovascular;  Laterality: N/A;   CATARACT EXTRACTION     CATARACT EXTRACTION W/ INTRAOCULAR LENS  IMPLANT, BILATERAL Bilateral    COLONOSCOPY     ESOPHAGOGASTRODUODENOSCOPY     IR ANGIO INTRA EXTRACRAN SEL COM CAROTID INNOMINATE BILAT MOD SED  12/30/2017   IR ANGIO VERTEBRAL SEL VERTEBRAL BILAT MOD SED  12/30/2017   LAPAROSCOPIC CHOLECYSTECTOMY  08/11/1999   LUMBAR DISC SURGERY  02/19/2014   ORIF ANKLE FRACTURE Left 04/13/2015   Procedure: OPEN REDUCTION INTERNAL FIXATION (ORIF) LEFT ANKLE FRACTURE;  Surgeon: Cammy Copa, MD;  Location: [facility] OR;  Service: Orthopedics;  Laterality: Left;   PACEMAKER PLACEMENT Right 07/22/2010   a. MDT dual chamber PPM  implanted by Dr Graciela Husbands    [facility] GENERATOR CHANGEOUT N/A 08/22/2022   Procedure: PPM GENERATOR Janeann Merl;  Surgeon: Duke Salvia, MD;  Location: [facility] INVASIVE CV LAB;  Service: Cardiovascular;  Laterality: N/A;   SHOULDER ARTHROSCOPY W/ ROTATOR CUFF REPAIR Right 08/30/2014   WITH MINI-OPEN ROTATOR CUFF REPAIR AND SUBACROMIAL DECOMPRESSION   SHOULDER ARTHROSCOPY WITH ROTATOR CUFF REPAIR AND SUBACROMIAL DECOMPRESSION Right 08/30/2014   Procedure: SHOULDER ARTHROSCOPY WITH MINI-OPEN ROTATOR CUFF REPAIR AND SUBACROMIAL DECOMPRESSION;  Surgeon: Cammy Copa, MD;  Location: [facility] OR;  Service: Orthopedics;  Laterality: Right;  RIGHT SHOULDER DIAGNOSTIC OPERATIVE ARTHROSCOPY, SUBACROMIAL DECOMPRESSION, MINI-OPEN ROTATOR CUFF REPAIR.   TOTAL HIP ARTHROPLASTY Right 08/14/2021   Procedure: TOTAL HIP ARTHROPLASTY ANTERIOR APPROACH;  Surgeon: Marcene Corning, MD;  Location: [facility] ORS;  Service: Orthopedics;  Laterality: Right;   TOTAL KNEE ARTHROPLASTY Left 02/13/2021   Procedure: LEFT TOTAL KNEE ARTHROPLASTY;  Surgeon: Marcene Corning, MD;  Location: [facility] ORS;  Service: Orthopedics;  Laterality: Left;   VAGINAL HYSTERECTOMY  07/22/1968   WRIST FRACTURE SURGERY Left      A IV Location/Drains/Wounds Patient Lines/Drains/Airways Status     Active Line/Drains/Airways     Name Placement date Placement time Site Days   Peripheral IV 12/01/22 20 G 1" Anterior;Right Forearm 12/01/22  2032  Forearm  1   Peripheral IV 12/01/22 22 G 1" Anterior;Left Forearm 12/01/22  2136  Forearm  1            Intake/Output Last 24 hours No intake or output data in the 24 hours ending 12/02/22 0308  Labs/Imaging Results for orders placed or performed during the hospital encounter of 12/01/22 (from the past 48 hour(s))  CBC with Differential     Status: None   Collection Time: 12/01/22  8:31 PM  Result Value Ref Range   WBC 5.9 4.0 - 10.5 K/uL   RBC 4.50 3.87 - 5.11 MIL/uL   Hemoglobin 14.2 12.0 - 15.0 g/dL   HCT 16.1  09.6 - 04.5 %   MCV 97.8 80.0 - 100.0 fL   MCH 31.6 26.0 - 34.0 pg   MCHC 32.3 30.0 - 36.0 g/dL   RDW 40.9 81.1 - 91.4 %   Platelets 230 150 - 400 K/uL   nRBC 0.0 0.0 - 0.2 %   Neutrophils Relative % 58 %   Neutro Abs 3.5 1.7 - 7.7 K/uL   Lymphocytes Relative 22 %   Lymphs Abs 1.3 0.7 - 4.0 K/uL   Monocytes Relative 15 %   Monocytes Absolute 0.9 0.1 - 1.0 K/uL   Eosinophils Relative 3 %   Eosinophils Absolute 0.2 0.0 - 0.5 K/uL   Basophils Relative 1 %   Basophils Absolute 0.0 0.0 - 0.1 K/uL   Immature Granulocytes 1 %   Abs Immature Granulocytes 0.04 0.00 - 0.07 K/uL    Comment: Performed at [facility], [address], [place], [place] [address]  Comprehensive metabolic panel     Status: Abnormal   Collection Time: 12/01/22  8:31 PM  Result Value Ref Range  Sodium 136 135 - 145 mmol/L   Potassium 3.3 (L) 3.5 - 5.1 mmol/L   Chloride 97 (L) 98 - 111 mmol/L   CO2 23 22 - 32 mmol/L   Glucose, Bld 123 (H) 70 - 99 mg/dL    Comment: Glucose reference range applies only to samples taken after fasting for at least 8 hours.   BUN 16 8 - 23 mg/dL   Creatinine, Ser 1.61 0.44 - 1.00 mg/dL   Calcium 09.6 (H) 8.9 - 10.3 mg/dL   Total Protein 7.4 6.5 - 8.1 g/dL   Albumin 4.3 3.5 - 5.0 g/dL   AST 37 15 - 41 U/L   ALT 28 0 - 44 U/L   Alkaline Phosphatase 92 38 - 126 U/L   Total Bilirubin 0.6 0.3 - 1.2 mg/dL   GFR, Estimated >04 >54 mL/min    Comment: (NOTE) Calculated using the CKD-EPI Creatinine Equation (2021)    Anion gap 16 (H) 5 - 15    Comment: Performed at [facility], [address], [place], [place] [address]  Magnesium     Status: None   Collection Time: 12/01/22  8:31 PM  Result Value Ref Range   Magnesium 2.1 1.7 - 2.4 mg/dL    Comment: Performed at [facility], [address] [place], [place] [address]  Troponin I (High Sensitivity)     Status: None   Collection Time: 12/01/22  8:31 PM  Result Value Ref Range   Troponin I (High Sensitivity) 11  <18 ng/L    Comment: (NOTE) Elevated high sensitivity troponin I (hsTnI) values and significant  changes across serial measurements may suggest ACS but many other  chronic and acute conditions are known to elevate hsTnI results.  Refer to the "Links" section for chest pain algorithms and additional  guidance. Performed at [facility], [address] [place], [place] [address]   Brain natriuretic peptide     Status: Abnormal   Collection Time: 12/01/22  8:31 PM  Result Value Ref Range   B Natriuretic Peptide 435.6 (H) 0.0 - 100.0 pg/mL    Comment: Performed at [facility], [address], [place], [place] [address]  I-stat chem 8, ed     Status: Abnormal   Collection Time: 12/01/22  8:40 PM  Result Value Ref Range   Sodium 138 135 - 145 mmol/L   Potassium 3.3 (L) 3.5 - 5.1 mmol/L   Chloride 100 98 - 111 mmol/L   BUN 17 8 - 23 mg/dL   Creatinine, Ser 5.78 (H) 0.44 - 1.00 mg/dL   Glucose, Bld 469 (H) 70 - 99 mg/dL    Comment: Glucose reference range applies only to samples taken after fasting for at least 8 hours.   Calcium, Ion 1.22 1.15 - 1.40 mmol/L   TCO2 25 22 - 32 mmol/L   Hemoglobin 15.3 (H) 12.0 - 15.0 g/dL   HCT 62.9 52.8 - 41.3 %  Troponin I (High Sensitivity)     Status: None   Collection Time: 12/01/22 10:29 PM  Result Value Ref Range   Troponin I (High Sensitivity) 12 <18 ng/L    Comment: (NOTE) Elevated high sensitivity troponin I (hsTnI) values and significant  changes across serial measurements may suggest ACS but many other  chronic and acute conditions are known to elevate hsTnI results.  Refer to the "Links" section for chest pain algorithms and additional  guidance. Performed at [facility], [address] [place], [place] [address]    CT Cervical Spine Wo Contrast  Result Date: 12/01/2022 CLINICAL DATA:  Status post trauma. EXAM: CT CERVICAL SPINE WITHOUT CONTRAST TECHNIQUE: Multidetector CT imaging of the cervical spine was  performed without intravenous contrast. Multiplanar CT image reconstructions were also generated. RADIATION DOSE REDUCTION: This exam was performed according to the departmental dose-optimization program which includes automated exposure control, adjustment of the mA and/or kV according to patient size and/or use of iterative reconstruction technique. COMPARISON:  August 17, 2013 FINDINGS: Alignment: Normal. Skull base and vertebrae: No acute fracture. Stable chronic and degenerative changes are seen involving the body and tip of the dens, as well as the adjacent portions of the anterior arch of C1. Soft tissues and spinal canal: No prevertebral fluid or swelling. No visible canal hematoma. Disc levels: Marked severity endplate sclerosis, anterior osteophyte formation and posterior bony spurring are seen at the levels of C3-C4, C4-C5, C5-C6 and C6-C7. Mild to moderate severity endplate sclerosis and anterior osteophyte formation is also seen at the level of C2-C3. There is moderate severity narrowing of the anterior atlantoaxial articulation. Marked severity intervertebral disc space narrowing is seen at C4-C5, C5-C6, C6-C7 and C7-T1. Mild intervertebral disc space narrowing is present throughout the remainder of the cervical spine. Bilateral marked severity multilevel facet joint hypertrophy is noted. Upper chest: Negative. Other: None. IMPRESSION: 1. No acute fracture or subluxation within the cervical spine. 2. Marked severity multilevel degenerative changes, as described above. Electronically Signed   By: Aram Candela M.D.   On: 12/01/2022 21:07   CT Head Wo Contrast  Result Date: 12/01/2022 CLINICAL DATA:  Status post trauma. EXAM: CT HEAD WITHOUT CONTRAST TECHNIQUE: Contiguous axial images were obtained from the base of the skull through the vertex without intravenous contrast. RADIATION DOSE REDUCTION: This exam was performed according to the departmental dose-optimization program which includes  automated exposure control, adjustment of the mA and/or kV according to patient size and/or use of iterative reconstruction technique. COMPARISON:  January 01, 2018 FINDINGS: Brain: There is moderate severity cerebral atrophy with widening of the extra-axial spaces and ventricular dilatation. There are areas of decreased attenuation within the white matter tracts of the supratentorial brain, consistent with microvascular disease changes. A very small, 2 mm thick acute left parietal subdural bleed is seen (axial CT image 17, CT series 3). There is no evidence of associated mass effect or midline shift. Vascular: No hyperdense vessel or unexpected calcification. Skull: Normal. Negative for fracture or focal lesion. Sinuses/Orbits: No acute finding. Other: None. IMPRESSION: 1. Very small acute left parietal subdural hematoma. MRI correlation is recommended. 2. Moderate severity cerebral atrophy and microvascular disease changes of the supratentorial brain. Electronically Signed   By: Aram Candela M.D.   On: 12/01/2022 21:03   DG CHEST PORT 1 VIEW  Result Date: 12/01/2022 CLINICAL DATA:  [id number] Fall [id number] EXAM: PORTABLE CHEST 1 VIEW COMPARISON:  Chest x-ray 08/22/2021, CT chest 01/01/2016 FINDINGS: Left chest wall dual lead pacemaker in grossly similar position. Stable enlarged cardiac silhouette. Otherwise the heart and mediastinal contours are within normal limits. Aortic calcification. No focal consolidation. No pulmonary edema. No pleural effusion. No pneumothorax. No acute osseous abnormality. IMPRESSION: 1. No acute cardiopulmonary abnormality. 2.  Aortic Atherosclerosis (ICD10-I70.0). Electronically Signed   By: Tish Frederickson M.D.   On: 12/01/2022 20:48    Pending Labs Unresulted Labs (From admission, onward)     Start     Ordered   12/02/22 0500  Basic metabolic panel  Tomorrow morning,   R        12/02/22 0056            Vitals/Pain Today's Vitals   12/02/22 0049 12/02/22 0100 12/02/22  0120 12/02/22 0200  BP:  (!) 159/95 (!) 150/70 (!) 154/99  Pulse:  68 67 69  Resp:  19 (!) 22 20  Temp: 98.5 F (36.9 C)     TempSrc: Oral     SpO2:  94% 97% 93%  Weight:      Height:      PainSc:        Isolation Precautions No active isolations  Medications Medications  atorvastatin (LIPITOR) tablet 40 mg (has no administration in time range)  levothyroxine (SYNTHROID) tablet 88 mcg (has no administration in time range)  acetaminophen (TYLENOL) tablet 650 mg (has no administration in time range)    Or  acetaminophen (TYLENOL) suppository 650 mg (has no administration in time range)  potassium chloride SA (KLOR-CON M) CR tablet 40 mEq (40 mEq Oral Given 12/02/22 0106)    Mobility walks with person assist     Focused Assessments Neuro Assessment Handoff:  Swallow screen pass? Yes  Cardiac Rhythm: Normal sinus rhythm       Neuro Assessment: Exceptions to [facility] Neuro Checks:      Has TPA been given? No If patient is a Neuro Trauma and patient is going to OR before floor call report to 4N Charge nurse: [phone] or [phone]   R Recommendations: See Admitting Provider Note  Report given to:   Additional Notes: Patient from home with fall/syncope, CT revealed SDH. Patient able to walk to bathroom with assistance.

## 2022-12-03 DIAGNOSIS — I4891 Unspecified atrial fibrillation: Secondary | ICD-10-CM | POA: Diagnosis not present

## 2022-12-03 DIAGNOSIS — R55 Syncope and collapse: Secondary | ICD-10-CM | POA: Diagnosis not present

## 2022-12-03 DIAGNOSIS — S065XAA Traumatic subdural hemorrhage with loss of consciousness status unknown, initial encounter: Secondary | ICD-10-CM | POA: Diagnosis not present

## 2022-12-03 LAB — CBC WITH DIFFERENTIAL/PLATELET
Abs Immature Granulocytes: 0.03 10*3/uL (ref 0.00–0.07)
Basophils Absolute: 0 10*3/uL (ref 0.0–0.1)
Basophils Relative: 0 %
Eosinophils Absolute: 0.1 10*3/uL (ref 0.0–0.5)
Eosinophils Relative: 2 %
HCT: 41.6 % (ref 36.0–46.0)
Hemoglobin: 13.7 g/dL (ref 12.0–15.0)
Immature Granulocytes: 0 %
Lymphocytes Relative: 12 %
Lymphs Abs: 0.9 10*3/uL (ref 0.7–4.0)
MCH: 31.6 pg (ref 26.0–34.0)
MCHC: 32.9 g/dL (ref 30.0–36.0)
MCV: 95.9 fL (ref 80.0–100.0)
Monocytes Absolute: 1.1 10*3/uL — ABNORMAL HIGH (ref 0.1–1.0)
Monocytes Relative: 16 %
Neutro Abs: 4.9 10*3/uL (ref 1.7–7.7)
Neutrophils Relative %: 70 %
Platelets: 197 10*3/uL (ref 150–400)
RBC: 4.34 MIL/uL (ref 3.87–5.11)
RDW: 13.8 % (ref 11.5–15.5)
WBC: 7.1 10*3/uL (ref 4.0–10.5)
nRBC: 0 % (ref 0.0–0.2)

## 2022-12-03 LAB — BASIC METABOLIC PANEL
Anion gap: 9 (ref 5–15)
BUN: 16 mg/dL (ref 8–23)
CO2: 25 mmol/L (ref 22–32)
Calcium: 10.1 mg/dL (ref 8.9–10.3)
Chloride: 103 mmol/L (ref 98–111)
Creatinine, Ser: 0.89 mg/dL (ref 0.44–1.00)
GFR, Estimated: >60 mL/min (ref >60)
Glucose, Bld: 118 mg/dL — ABNORMAL HIGH (ref 70–99)
Potassium: 3.3 mmol/L — ABNORMAL LOW (ref 3.5–5.1)
Sodium: 137 mmol/L (ref 135–145)

## 2022-12-03 LAB — MAGNESIUM: Magnesium: 2.1 mg/dL (ref 1.7–2.4)

## 2022-12-03 MED ORDER — APIXABAN 5 MG PO TABS
5.0000 mg | ORAL_TABLET | Freq: Two times a day (BID) | ORAL | 3 refills | Status: DC
Start: 2022-12-16 — End: 2023-04-11

## 2022-12-03 MED ORDER — POTASSIUM CHLORIDE CRYS ER 20 MEQ PO TBCR
40.0000 meq | EXTENDED_RELEASE_TABLET | Freq: Once | ORAL | Status: AC
  Administered 2022-12-03: 40 meq via ORAL
  Filled 2022-12-03: qty 2

## 2022-12-03 NOTE — Discharge Instructions (Signed)
Hold Eliquis for 2 weeks. Okay to resume on 12/16/22.

## 2022-12-03 NOTE — Discharge Summary (Signed)
Physician Discharge Summary   Brittany [name] ZOX:[id number] DOB: 10/30/1933 DOA: 12/01/2022  PCP: Deatra James, MD  Admit date: 12/01/2022 Discharge date: 12/03/2022   Admitted From: Home Disposition:  Home Discharging physician: Lewie Chamber, MD Barriers to discharge: none  Recommendations at discharge: Follow up with cardiology    Discharge Condition: stable CODE STATUS: DNR Diet recommendation:  Diet Orders (From admission, onward)     Start     Ordered   12/03/22 0000  Diet general        12/03/22 1153   12/02/22 0815  Diet regular Room service appropriate? Yes; Fluid consistency: Thin  Diet effective now       Question Answer Comment  Room service appropriate? Yes   Fluid consistency: Thin      12/02/22 0815            Hospital Course: Brittany [name] is an 89 yo female with PMH permanent A-fib on Eliquis, sinus node dysfunction status post PPM, HFpEF, hypertension, hyperlipidemia, depression, GERD, hypothyroidism, OSA, PVD, stroke who presented to the ED via EMS from home for syncope and fall.   CT head showed small acute left parietal subdural hematoma and underlying moderate severity cerebral atrophy. ED provider discussed the case with Dr. Conchita Paris with neurosurgery who feels that there is no need for anticoagulation reversal as the area of bleeding is very small.  He recommended repeating head CT in 6 hours and neurochecks every 4 hours. Recommended holding Eliquis for the next 2 weeks.  Patient's pacemaker was interrogated in the ED and no acute events reported.  Repeat CT head showed stability and no evolution of hematoma.   Patient states she was walking to her bathroom using her walker and then the next thing she remembers is being in the hospital although she does not remember going for CT scan of her head.  She denies preceding lightheadedness/dizziness, chest pain, palpitations, or shortness of breath.  Patient states she has been in her usual state of health  otherwise and has not had any fevers, cough, nausea, vomiting, abdominal pain, diarrhea, or any urinary symptoms.   She takes Eliquis due to history of A-fib and prior stroke. She denies any headaches.  Per husband at bedside, patient's son had called to wish her for Mother's Day and she did not pick up the phone.  Husband was in another room and when he went to check on her, she was found on the floor awake but confused and did not know what had happened.  She does not have history of seizures.  Also patient denies history of blood clots.  Assessment and Plan: * Subdural hematoma (HCC) - s/p fall and on Eliquis for permanent afib - per neurosurgery, hold Eliquis for 2 weeks - repeat CTH 6 hrs after initial shows stable SDH  Syncope - differential includes cardiogenic vs neurogenic.Has also been on Toprol 100mg  TID it appears and HR currently in 60s though she seems to be asymptomatic currently; has been noted to have some exertional SOB over the past month or so.  If not cardiogenic, does have moderate amount of cerebral atrophy and could therefore have contributed - EEG negative -Discussed with cardiology.  Pacemaker is functioning properly and echo also reassuring.  No medication changes recommended - orthostatic mildly positive from sitting to standing but recovers - patient recommended to check BP log at discharge; no med changes made  Fall at home, initial encounter - CT cervical spine negative for fractures or acute findings; noted  with multilevel degenerative changes - Right wrist pain noted; xray shows osteoarthritis  A-fib (HCC) - Eliquis on hold x 2 weeks - see SDH - cardizem and toprol resumed  Hypokalemia - replete as needed  Chronic heart failure with preserved ejection fraction (HFpEF) (HCC) - no s/s exacerbation - mild exertional SOB lately but also reported this at recent cardiology visit - no acute findings on echo    The patient's chronic medical conditions were  treated accordingly per the patient's home medication regimen except as noted.  On day of discharge, patient was felt deemed stable for discharge. Patient/family member advised to call PCP or come back to ER if needed.   Principal Diagnosis: Subdural hematoma [facility])  Discharge Diagnoses: Active Hospital Problems   Diagnosis Date Noted   Subdural hematoma (HCC) 12/01/2022    Priority: 1.   Syncope 12/02/2022    Priority: 2.   Fall at home, initial encounter 12/02/2022    Priority: 3.   A-fib (HCC) 03/22/2019    Priority: 3.   Hypokalemia 12/02/2022   SDH (subdural hematoma) (HCC) 12/02/2022   Chronic heart failure with preserved ejection fraction (HFpEF) (HCC) 04/12/2020   Sinus node dysfunction (HCC) 03/04/2013    Resolved Hospital Problems  No resolved problems to display.     Discharge Instructions     Diet general   Complete by: As directed    Increase activity slowly   Complete by: As directed       Allergies as of 12/03/2022       Reactions   Citalopram Hydrobromide Other (See Comments)   Other reaction(s): QT prolongation with Tikosyn   Adhesive [tape] Swelling, Rash   Bacitracin-polymyxin B Other (See Comments)   Other reaction(s): Unknown   Benzalkonium Chloride Rash   Pt was not aware of this allergy   Neosporin [neomycin-polymyxin-gramicidin] Swelling, Rash        Medication List     TAKE these medications    acetaminophen 500 MG tablet Commonly known as: TYLENOL Take 500 mg by mouth 2 (two) times daily.   alendronate 70 MG tablet Commonly known as: FOSAMAX Take 1 tablet (70 mg total) by mouth every 7 (seven) days. Take with a full glass of water on an empty stomach. What changed: additional instructions   apixaban 5 MG Tabs tablet Commonly known as: ELIQUIS Take 1 tablet (5 mg total) by mouth 2 (two) times daily. Start taking on: Dec 16, 2022 What changed: These instructions start on Dec 16, 2022. If you are unsure what to do until then, ask  your doctor or other care provider.   atorvastatin 40 MG tablet Commonly known as: LIPITOR Take 40 mg by mouth every evening.   B-complex with vitamin C tablet Take 1 tablet by mouth daily with lunch.   Cartia XT 120 MG 24 hr capsule Generic drug: diltiazem TAKE 1 CAPSULE BY MOUTH EVERY MORNING AND 1 CAPSULE DAILY AT BEDTIME What changed: See the new instructions.   dapagliflozin propanediol 10 MG Tabs tablet Commonly known as: Farxiga Take 1 tablet (10 mg total) by mouth daily before breakfast.   furosemide 40 MG tablet Commonly known as: LASIX Take 2 tablets (80 mg total) by mouth every other day.   levothyroxine 88 MCG tablet Commonly known as: SYNTHROID Take 88 mcg by mouth daily before breakfast.   lisinopril 10 MG tablet Commonly known as: ZESTRIL Take 1 tablet (10 mg total) by mouth daily.   Lutein 20 MG Tabs Take 20 mg by  mouth daily with breakfast.   magnesium oxide 400 MG tablet Commonly known as: MAG-OX Take 1 tablet (400 mg total) by mouth daily.   Melatonin 10 MG Tabs Take 10-40 mg by mouth at bedtime as needed (sleep).   metoprolol succinate 50 MG 24 hr tablet Commonly known as: TOPROL-XL TAKE TWO TABLETS BY MOUTH WITH OR FOLLOWING A MEAL THREE TIMES DAILY What changed: See the new instructions.   multivitamin with minerals Tabs tablet Take 1 tablet by mouth daily with breakfast.   potassium chloride 10 MEQ tablet Commonly known as: KLOR-CON Take 1 tablet (10 mEq total) by mouth daily.   PRESERVISION AREDS 2 PO Take 1 capsule by mouth 2 (two) times daily with a meal.   sertraline 50 MG tablet Commonly known as: Zoloft Take 1 tablet (50 mg total) by mouth daily.   Vitamin D3 125 MCG (5000 UT) Tabs Take 5,000 Units by mouth daily with breakfast.        Follow-up Information     Deatra James, MD. Schedule an appointment as soon as possible for a visit in 2 week(s).   Specialty: Family Medicine Contact information: [phone] [place] [address] [phone]                Allergies  Allergen Reactions   Citalopram Hydrobromide Other (See Comments)    Other reaction(s): QT prolongation with Tikosyn   Adhesive [Tape] Swelling and Rash   Bacitracin-Polymyxin B Other (See Comments)    Other reaction(s): Unknown   Benzalkonium Chloride Rash    Pt was not aware of this allergy   Neosporin [Neomycin-Polymyxin-Gramicidin] Swelling and Rash    Consultations:   Procedures:   Discharge Exam: BP 132/87 (BP Location: Right Arm)   Pulse 98   Temp (!) 97.5 F (36.4 C) (Oral)   Resp 16   Ht 5\' 8"  (1.727 m)   Wt 95.8 kg   LMP 07/22/1968   SpO2 95%   BMI 32.11 kg/m  Physical Exam Constitutional:      General: She is not in acute distress.    Appearance: Normal appearance. She is not ill-appearing.  HENT:     Head: Normocephalic and atraumatic.     Mouth/Throat:     Mouth: Mucous membranes are moist.  Eyes:     Extraocular Movements: Extraocular movements intact.  Cardiovascular:     Rate and Rhythm: Bradycardia present. Rhythm irregular.  Pulmonary:     Effort: Pulmonary effort is normal. No respiratory distress.     Breath sounds: Normal breath sounds. No wheezing.  Abdominal:     General: Bowel sounds are normal. There is no distension.     Palpations: Abdomen is soft.     Tenderness: There is no abdominal tenderness.  Musculoskeletal:        General: No swelling.     Cervical back: Normal range of motion and neck supple.     Comments: Decreased passive ROM in R wrist due to pain  Skin:    General: Skin is dry.  Neurological:     Mental Status: She is alert and oriented to person, place, and time.     Sensory: No sensory deficit.     Motor: No weakness.  Psychiatric:        Behavior: Behavior normal.      The results of significant diagnostics from this hospitalization (including imaging, microbiology, ancillary and laboratory) are listed below for reference.    Microbiology: No results found  for this or any previous visit (from the past 240 hour(s)).   Labs: BNP (last 3 results) Recent Labs    12/01/22 2031  BNP 435.6*   Basic Metabolic Panel: Recent Labs  Lab 12/01/22 2031 12/01/22 2040 12/02/22 0255 12/03/22 0357  NA 136 138 135 137  K 3.3* 3.3* 3.6 3.3*  CL 97* 100 101 103  CO2 23  --  26 25  GLUCOSE 123* 119* 109* 118*  BUN 16 17 11 16   CREATININE 0.87 1.10* 0.70 0.89  CALCIUM 10.7*  --  9.6 10.1  MG 2.1  --   --  2.1   Liver Function Tests: Recent Labs  Lab 12/01/22 2031  AST 37  ALT 28  ALKPHOS 92  BILITOT 0.6  PROT 7.4  ALBUMIN 4.3   No results for input(s): "LIPASE", "AMYLASE" in the last 168 hours. No results for input(s): "AMMONIA" in the last 168 hours. CBC: Recent Labs  Lab 12/01/22 2031 12/01/22 2040 12/03/22 0357  WBC 5.9  --  7.1  NEUTROABS 3.5  --  4.9  HGB 14.2 15.3* 13.7  HCT 44.0 45.0 41.6  MCV 97.8  --  95.9  PLT 230  --  197   Cardiac Enzymes: No results for input(s): "CKTOTAL", "CKMB", "CKMBINDEX", "TROPONINI" in the last 168 hours. BNP: Invalid input(s): "POCBNP" CBG: No results for input(s): "GLUCAP" in the last 168 hours. D-Dimer No results for input(s): "DDIMER" in the last 72 hours. Hgb A1c No results for input(s): "HGBA1C" in the last 72 hours. Lipid Profile No results for input(s): "CHOL", "HDL", "LDLCALC", "TRIG", "CHOLHDL", "LDLDIRECT" in the last 72 hours. Thyroid function studies Recent Labs    12/02/22 0255  TSH 3.100   Anemia work up No results for input(s): "VITAMINB12", "FOLATE", "FERRITIN", "TIBC", "IRON", "RETICCTPCT" in the last 72 hours. Urinalysis    Component Value Date/Time   COLORURINE STRAW (A) 02/08/2021 1350   APPEARANCEUR CLEAR 02/08/2021 1350   LABSPEC 1.008 02/08/2021 1350   PHURINE 7.0 02/08/2021 1350   GLUCOSEU NEGATIVE 02/08/2021 1350   HGBUR SMALL (A) 02/08/2021 1350   BILIRUBINUR NEGATIVE 02/08/2021 1350   KETONESUR NEGATIVE  02/08/2021 1350   PROTEINUR NEGATIVE 02/08/2021 1350   UROBILINOGEN TEST NOT PERFORMED 09/20/2013 1043   NITRITE NEGATIVE 02/08/2021 1350   LEUKOCYTESUR SMALL (A) 02/08/2021 1350   Sepsis Labs Recent Labs  Lab 12/01/22 2031 12/03/22 0357  WBC 5.9 7.1   Microbiology No results found for this or any previous visit (from the past 240 hour(s)).  Procedures/Studies: ECHOCARDIOGRAM COMPLETE  Result Date: 12/02/2022    ECHOCARDIOGRAM REPORT   Patient Name:   TEDDI BOWLBY [facility] Date of Exam: 12/02/2022 Medical Rec #:  [id number]       Height:       68.0 in Accession #:    [id number]      Weight:       211.2 lb Date of Birth:  November 19, 1933       BSA:          2.092 m Patient Age:    88 years        BP:           144/64 mmHg Patient Gender: F               HR:           79 bpm. Exam Location:  Inpatient Procedure: 2D Echo, Cardiac Doppler and Color Doppler Indications:    Syncope R55  History:  Patient has prior history of Echocardiogram examinations, most                 recent 08/10/2021. Stroke, Arrythmias:Atrial Fibrillation,                 Signs/Symptoms:Syncope; Risk Factors:Hypertension, Dyslipidemia                 and Former Smoker.  Sonographer:    Dondra Prader RVT RCS Referring Phys: [id number] VASUNDHRA RATHORE IMPRESSIONS  1. Left ventricular ejection fraction, by estimation, is 60 to 65%. The left ventricle has normal function. The left ventricle has no regional wall motion abnormalities. There is mild left ventricular hypertrophy of the basal-septal segment. Left ventricular diastolic function could not be evaluated.  2. Right ventricular systolic function is normal. The right ventricular size is normal. There is normal pulmonary artery systolic pressure.  3. Left atrial size was moderately dilated.  4. Right atrial size was moderately dilated.  5. The mitral valve is normal in structure. Mild mitral valve regurgitation. No evidence of mitral stenosis.  6. The aortic valve is tricuspid. Aortic  valve regurgitation is mild. Aortic valve sclerosis/calcification is present, without any evidence of aortic stenosis.  7. The inferior vena cava is dilated in size with >50% respiratory variability, suggesting right atrial pressure of 8 mmHg. FINDINGS  Left Ventricle: Left ventricular ejection fraction, by estimation, is 60 to 65%. The left ventricle has normal function. The left ventricle has no regional wall motion abnormalities. The left ventricular internal cavity size was normal in size. There is  mild left ventricular hypertrophy of the basal-septal segment. Left ventricular diastolic function could not be evaluated due to atrial fibrillation. Left ventricular diastolic function could not be evaluated. Right Ventricle: The right ventricular size is normal. Right ventricular systolic function is normal. There is normal pulmonary artery systolic pressure. The tricuspid regurgitant velocity is 2.54 m/s, and with an assumed right atrial pressure of 8 mmHg,  the estimated right ventricular systolic pressure is 33.8 mmHg. Left Atrium: Left atrial size was moderately dilated. Right Atrium: Right atrial size was moderately dilated. Pericardium: There is no evidence of pericardial effusion. Mitral Valve: The mitral valve is normal in structure. Mild mitral valve regurgitation. No evidence of mitral valve stenosis. Tricuspid Valve: The tricuspid valve is normal in structure. Tricuspid valve regurgitation is mild . No evidence of tricuspid stenosis. Aortic Valve: The aortic valve is tricuspid. Aortic valve regurgitation is mild. Aortic valve sclerosis/calcification is present, without any evidence of aortic stenosis. Aortic valve mean gradient measures 3.5 mmHg. Aortic valve peak gradient measures 6.0 mmHg. Aortic valve area, by VTI measures 2.73 cm. Pulmonic Valve: The pulmonic valve was normal in structure. Pulmonic valve regurgitation is trivial. No evidence of pulmonic stenosis. Aorta: The aortic root is normal in  size and structure. Venous: The inferior vena cava is dilated in size with greater than 50% respiratory variability, suggesting right atrial pressure of 8 mmHg. IAS/Shunts: No atrial level shunt detected by color flow Doppler. Additional Comments: A device lead is visualized.  LEFT VENTRICLE PLAX 2D LVIDd:         5.20 cm LVIDs:         3.70 cm LV PW:         0.90 cm LV IVS:        1.30 cm LVOT diam:     2.10 cm LV SV:         65 LV SV Index:   31 LVOT Area:  3.46 cm  RIGHT VENTRICLE            IVC RV Basal diam:  3.60 cm    IVC diam: 2.30 cm RV S prime:     9.20 cm/s TAPSE (M-mode): 2.2 cm LEFT ATRIUM              Index        RIGHT ATRIUM           Index LA diam:        5.60 cm  2.68 cm/m   RA Area:     27.00 cm LA Vol (A2C):   108.0 ml 51.63 ml/m  RA Volume:   97.80 ml  46.76 ml/m LA Vol (A4C):   81.2 ml  38.82 ml/m LA Biplane Vol: 102.0 ml 48.77 ml/m  AORTIC VALVE                    PULMONIC VALVE AV Area (Vmax):    2.72 cm     PV Vmax:       1.04 m/s AV Area (Vmean):   2.65 cm     PV Peak grad:  4.4 mmHg AV Area (VTI):     2.73 cm AV Vmax:           122.00 cm/s AV Vmean:          82.700 cm/s AV VTI:            0.238 m AV Peak Grad:      6.0 mmHg AV Mean Grad:      3.5 mmHg LVOT Vmax:         95.70 cm/s LVOT Vmean:        63.200 cm/s LVOT VTI:          0.188 m LVOT/AV VTI ratio: 0.79 AR Vena Contracta: 0.50 cm  AORTA Ao Root diam: 3.00 cm Ao Asc diam:  3.70 cm Ao Arch diam: 3.3 cm MITRAL VALVE               TRICUSPID VALVE MV Area (PHT): 3.66 cm    TR Peak grad:   25.8 mmHg MV Decel Time: 207 msec    TR Vmax:        254.00 cm/s MV E velocity: 95.53 cm/s MV A velocity: 27.00 cm/s  SHUNTS MV E/A ratio:  3.54        Systemic VTI:  0.19 m                            Systemic Diam: 2.10 cm Olga Millers MD Electronically signed by Olga Millers MD Signature Date/Time: 12/02/2022/2:22:42 PM    Final    DG Wrist Complete Right  Result Date: 12/02/2022 CLINICAL DATA:  Fall. EXAM: RIGHT WRIST - COMPLETE  3+ VIEW COMPARISON:  Right wrist radiographs 04/29/2014 FINDINGS: There is again diffuse decreased bone mineralization. Mild distal radial articular surface concavity at the articulation with the scaphoid is unchanged. Mild right chondrocalcinosis of the radiocarpal joint and triangular fibrocartilage complex. Mild radiocarpal joint space narrowing with mild distal radial styloid degenerative osteophytosis. Mild lateral aspect of the triscaphe joint space narrowing. Severe thumb carpometacarpal joint space narrowing, subchondral sclerosis/cystic change, and peripheral osteophytosis. Moderate thumb metacarpophalangeal joint space narrowing. No acute fracture or dislocation. IMPRESSION: 1. No acute fracture. 2. Severe thumb carpometacarpal osteoarthritis. 3. Moderate thumb metacarpophalangeal and mild radiocarpal osteoarthritis. Electronically Signed   By: Kerin Salen.D.  On: 12/02/2022 13:41   EEG adult  Result Date: 12/02/2022 Charlsie Quest, MD     12/02/2022 11:35 AM Patient Name: ELLYOTT MARTELLO MRN: [id number] Epilepsy Attending: Charlsie Quest Referring Physician/Provider: John Giovanni, MD Date: 12/02/2022 Duration: 25.03 mins Patient history: 89yo F with left parietal subdural hematoma. EEG to evaluate for seizure. Level of alertness: Awake AEDs during EEG study: None Technical aspects: This EEG study was done with scalp electrodes positioned according to the 10-20 International system of electrode placement. Electrical activity was reviewed with band pass filter of 1-70Hz , sensitivity of 7 uV/mm, display speed of 56mm/sec with a 60Hz  notched filter applied as appropriate. EEG data were recorded continuously and digitally stored.  Video monitoring was available and reviewed as appropriate. Description: The posterior dominant rhythm consists of 9 Hz activity of moderate voltage (25-35 uV) seen predominantly in posterior head regions, symmetric and reactive to eye opening and eye closing.  Hyperventilation and photic stimulation were not performed.   IMPRESSION: This study is within normal limits. No seizures or epileptiform discharges were seen throughout the recording. A normal interictal EEG does not exclude the diagnosis of epilepsy. Priyanka Annabelle Harman   CT HEAD WO CONTRAST ( )  Result Date: 12/02/2022 CLINICAL DATA:  Subdural hemorrhage follow-up. EXAM: CT HEAD WITHOUT CONTRAST TECHNIQUE: Contiguous axial images were obtained from the base of the skull through the vertex without intravenous contrast. RADIATION DOSE REDUCTION: This exam was performed according to the departmental dose-optimization program which includes automated exposure control, adjustment of the mA and/or kV according to patient size and/or use of iterative reconstruction technique. COMPARISON:  Head CT from yesterday FINDINGS: Brain: Trace left parietal subdural hemorrhage is unchanged as marked on 8:22 at 2 mm in thickness. No mass effect. Advanced chronic small vessel ischemia with confluent low-density in the cerebral white matter. There is cerebral volume loss. There is disproportionate subarachnoid spaces and callosum angle narrowing, although ventriculomegaly is mild for age in atrophy. Vascular: No hyperdense vessel or unexpected calcification. Skull: Normal. Negative for fracture or focal lesion. Sinuses/Orbits: No acute finding. IMPRESSION: No progression of the trace subdural hemorrhage on the left. No intracranial mass effect. Electronically Signed   By: Tiburcio Pea M.D.   On: 12/02/2022 04:46   CT Cervical Spine Wo Contrast  Result Date: 12/01/2022 CLINICAL DATA:  Status post trauma. EXAM: CT CERVICAL SPINE WITHOUT CONTRAST TECHNIQUE: Multidetector CT imaging of the cervical spine was performed without intravenous contrast. Multiplanar CT image reconstructions were also generated. RADIATION DOSE REDUCTION: This exam was performed according to the departmental dose-optimization program which includes  automated exposure control, adjustment of the mA and/or kV according to patient size and/or use of iterative reconstruction technique. COMPARISON:  August 17, 2013 FINDINGS: Alignment: Normal. Skull base and vertebrae: No acute fracture. Stable chronic and degenerative changes are seen involving the body and tip of the dens, as well as the adjacent portions of the anterior arch of C1. Soft tissues and spinal canal: No prevertebral fluid or swelling. No visible canal hematoma. Disc levels: Marked severity endplate sclerosis, anterior osteophyte formation and posterior bony spurring are seen at the levels of C3-C4, C4-C5, C5-C6 and C6-C7. Mild to moderate severity endplate sclerosis and anterior osteophyte formation is also seen at the level of C2-C3. There is moderate severity narrowing of the anterior atlantoaxial articulation. Marked severity intervertebral disc space narrowing is seen at C4-C5, C5-C6, C6-C7 and C7-T1. Mild intervertebral disc space narrowing is present throughout the remainder of the cervical spine. Bilateral  marked severity multilevel facet joint hypertrophy is noted. Upper chest: Negative. Other: None. IMPRESSION: 1. No acute fracture or subluxation within the cervical spine. 2. Marked severity multilevel degenerative changes, as described above. Electronically Signed   By: Aram Candela M.D.   On: 12/01/2022 21:07   CT Head Wo Contrast  Result Date: 12/01/2022 CLINICAL DATA:  Status post trauma. EXAM: CT HEAD WITHOUT CONTRAST TECHNIQUE: Contiguous axial images were obtained from the base of the skull through the vertex without intravenous contrast. RADIATION DOSE REDUCTION: This exam was performed according to the departmental dose-optimization program which includes automated exposure control, adjustment of the mA and/or kV according to patient size and/or use of iterative reconstruction technique. COMPARISON:  January 01, 2018 FINDINGS: Brain: There is moderate severity cerebral atrophy  with widening of the extra-axial spaces and ventricular dilatation. There are areas of decreased attenuation within the white matter tracts of the supratentorial brain, consistent with microvascular disease changes. A very small, 2 mm thick acute left parietal subdural bleed is seen (axial CT image 17, CT series 3). There is no evidence of associated mass effect or midline shift. Vascular: No hyperdense vessel or unexpected calcification. Skull: Normal. Negative for fracture or focal lesion. Sinuses/Orbits: No acute finding. Other: None. IMPRESSION: 1. Very small acute left parietal subdural hematoma. MRI correlation is recommended. 2. Moderate severity cerebral atrophy and microvascular disease changes of the supratentorial brain. Electronically Signed   By: Aram Candela M.D.   On: 12/01/2022 21:03   DG CHEST PORT 1 VIEW  Result Date: 12/01/2022 CLINICAL DATA:  [id number] Fall [id number] EXAM: PORTABLE CHEST 1 VIEW COMPARISON:  Chest x-ray 08/22/2021, CT chest 01/01/2016 FINDINGS: Left chest wall dual lead pacemaker in grossly similar position. Stable enlarged cardiac silhouette. Otherwise the heart and mediastinal contours are within normal limits. Aortic calcification. No focal consolidation. No pulmonary edema. No pleural effusion. No pneumothorax. No acute osseous abnormality. IMPRESSION: 1. No acute cardiopulmonary abnormality. 2.  Aortic Atherosclerosis (ICD10-I70.0). Electronically Signed   By: Tish Frederickson M.D.   On: 12/01/2022 20:48     Time coordinating discharge: Over 30 minutes    Lewie Chamber, MD  [facility] 12/03/2022, 4:32 PM

## 2022-12-03 NOTE — Plan of Care (Signed)
Patient being discharged home with husband today

## 2022-12-12 ENCOUNTER — Ambulatory Visit (INDEPENDENT_AMBULATORY_CARE_PROVIDER_SITE_OTHER): Payer: Medicare Other

## 2022-12-12 DIAGNOSIS — I495 Sick sinus syndrome: Secondary | ICD-10-CM | POA: Diagnosis not present

## 2022-12-12 LAB — CUP PACEART REMOTE DEVICE CHECK
Battery Remaining Longevity: 148 mo
Battery Voltage: 3.18 V
Brady Statistic RA Percent Paced: 0 %
Brady Statistic RV Percent Paced: 72.52 %
Date Time Interrogation Session: 20240523013327
Implantable Lead Connection Status: 753985
Implantable Lead Connection Status: 753985
Implantable Lead Implant Date: 20121213
Implantable Lead Implant Date: 20121213
Implantable Lead Location: 753859
Implantable Lead Location: 753860
Implantable Lead Model: 4076
Implantable Lead Model: 4076
Implantable Lead Serial Number: 835025
Implantable Lead Serial Number: 869269
Implantable Pulse Generator Implant Date: 20240201
Lead Channel Impedance Value: 285 Ohm
Lead Channel Impedance Value: 342 Ohm
Lead Channel Impedance Value: 361 Ohm
Lead Channel Impedance Value: 456 Ohm
Lead Channel Pacing Threshold Amplitude: 1.125 V
Lead Channel Pacing Threshold Pulse Width: 0.4 ms
Lead Channel Sensing Intrinsic Amplitude: 0.625 mV
Lead Channel Sensing Intrinsic Amplitude: 0.625 mV
Lead Channel Sensing Intrinsic Amplitude: 10 mV
Lead Channel Sensing Intrinsic Amplitude: 10 mV
Lead Channel Setting Pacing Amplitude: 2.25 V
Lead Channel Setting Pacing Pulse Width: 0.4 ms
Lead Channel Setting Sensing Sensitivity: 0.9 mV
Zone Setting Status: 755011
Zone Setting Status: 755011

## 2022-12-17 DIAGNOSIS — M9950 Intervertebral disc stenosis of neural canal of head region: Secondary | ICD-10-CM | POA: Diagnosis not present

## 2022-12-17 DIAGNOSIS — M9902 Segmental and somatic dysfunction of thoracic region: Secondary | ICD-10-CM | POA: Diagnosis not present

## 2022-12-17 DIAGNOSIS — M9903 Segmental and somatic dysfunction of lumbar region: Secondary | ICD-10-CM | POA: Diagnosis not present

## 2022-12-17 DIAGNOSIS — M546 Pain in thoracic spine: Secondary | ICD-10-CM | POA: Diagnosis not present

## 2022-12-25 DIAGNOSIS — Z0000 Encounter for general adult medical examination without abnormal findings: Secondary | ICD-10-CM | POA: Diagnosis not present

## 2022-12-25 DIAGNOSIS — I10 Essential (primary) hypertension: Secondary | ICD-10-CM | POA: Diagnosis not present

## 2022-12-25 DIAGNOSIS — R7303 Prediabetes: Secondary | ICD-10-CM | POA: Diagnosis not present

## 2022-12-25 DIAGNOSIS — Z23 Encounter for immunization: Secondary | ICD-10-CM | POA: Diagnosis not present

## 2022-12-25 DIAGNOSIS — E213 Hyperparathyroidism, unspecified: Secondary | ICD-10-CM | POA: Diagnosis not present

## 2022-12-25 DIAGNOSIS — E785 Hyperlipidemia, unspecified: Secondary | ICD-10-CM | POA: Diagnosis not present

## 2022-12-25 DIAGNOSIS — M810 Age-related osteoporosis without current pathological fracture: Secondary | ICD-10-CM | POA: Diagnosis not present

## 2022-12-25 DIAGNOSIS — E039 Hypothyroidism, unspecified: Secondary | ICD-10-CM | POA: Diagnosis not present

## 2022-12-25 DIAGNOSIS — I4891 Unspecified atrial fibrillation: Secondary | ICD-10-CM | POA: Diagnosis not present

## 2022-12-25 DIAGNOSIS — Z1331 Encounter for screening for depression: Secondary | ICD-10-CM | POA: Diagnosis not present

## 2022-12-25 DIAGNOSIS — F411 Generalized anxiety disorder: Secondary | ICD-10-CM | POA: Diagnosis not present

## 2023-01-01 NOTE — Progress Notes (Signed)
Remote pacemaker transmission.   

## 2023-01-06 ENCOUNTER — Encounter (HOSPITAL_BASED_OUTPATIENT_CLINIC_OR_DEPARTMENT_OTHER): Payer: Self-pay | Admitting: Pulmonary Disease

## 2023-01-06 ENCOUNTER — Ambulatory Visit (INDEPENDENT_AMBULATORY_CARE_PROVIDER_SITE_OTHER): Payer: Medicare Other | Admitting: Pulmonary Disease

## 2023-01-06 VITALS — BP 128/64 | HR 78 | Ht 68.0 in | Wt 211.0 lb

## 2023-01-06 DIAGNOSIS — G4733 Obstructive sleep apnea (adult) (pediatric): Secondary | ICD-10-CM

## 2023-01-06 NOTE — Progress Notes (Signed)
[facility]  Chief Complaint  Patient presents with   Follow-up    43yr f/u for OSA. States the machine has been working for her.     Past Surgical History:  She  has a past surgical history that includes pacemaker placement (Right, 07/22/2010); Cataract extraction; Colonoscopy; Esophagogastroduodenoscopy; Shoulder arthroscopy w/ rotator cuff repair (Right, 08/30/2014); Laparoscopic cholecystectomy (08/11/1999); Lumbar disc surgery (02/19/2014); Back surgery; Vaginal hysterectomy (07/22/1968); Cataract extraction w/ intraocular lens  implant, bilateral (Bilateral); Shoulder arthroscopy with rotator cuff repair and subacromial decompression (Right, 08/30/2014); ORIF ankle fracture (Left, 04/13/2015); IR ANGIO VERTEBRAL SEL VERTEBRAL BILAT MOD SED (12/30/2017); IR ANGIO INTRA EXTRACRAN SEL COM CAROTID INNOMINATE BILAT MOD SED (12/30/2017); Wrist fracture surgery (Left); Total knee arthroplasty (Left, 02/13/2021); Total hip arthroplasty (Right, 08/14/2021); Cardioversion (N/A, 05/02/2022); and PPM GENERATOR CHANGEOUT (N/A, 08/22/2022).  Past Medical History:  A fib, HTN, Depression, Hypothyroidism, GERD, Hyponatremia, CVA, Sinus node dysfunction s/p PM, PSVT, Osteoporosis, Macular degeneration, Hypercalcemia, Colon polyps  Constitutional:  BP 128/64   Pulse 78   Ht 5\' 8"  (1.727 m)   Wt 211 lb (95.7 kg)   LMP 07/22/1968   SpO2 96% Comment: on RA  BMI 32.08 kg/m   Brief Summary:  Brittany [name] is a 89 y.o. female with obstructive sleep apnea.      Subjective:   She is here with her husband.  She has been using her replacement Philips machine.  No issues with mask fit or pressure.  She isn't sure how to get information from the machine anymore.  Her prior machine was about 87 years old before she got the factory replacement.  Physical Exam:   Appearance - well kempt   ENMT - no sinus tenderness, no oral exudate, no LAN, Mallampati 3 airway,  no stridor  Respiratory - equal breath sounds bilaterally, no wheezing or rales  CV - s1s2 regular rate and rhythm, no murmurs  Ext - no clubbing, no edema  Skin - no rashes  Psych - normal mood and affect     Pulmonary Tests:  PFT 11/06/21 >> FEV1 1.91 (92%), FEV1% 78, TLC 4.67 (84%), DLCO 86%  Sleep Tests:  PSG 08/13/12 ([place]) >> AHI 24, SaO2 low 72%, CPAP 8 PSG 07/03/14 >> AHI 24.4, SaO2 low 76%, PLMI 81.8.  CPAP 8 cm H2O >> AHI 0, +S.   CPAP 04/24/21 to 05/23/21 >> used on 29 of 30 nights with average 9 hrs 16 min.  Average AHI 2.5 with CPAP 8 cm H2O  Cardiac Tests:  CPET 10/11/22 >> no cardiopulmonary limitation Echo 12/02/22 >> EF 60 to 65%, mild LVH, mod LA/RA dilation, mild MR, mild AR  Social History:  She  reports that she quit smoking about 32 years ago. Her smoking use included cigarettes. She has a 45.00 pack-year smoking history. She has never used smokeless tobacco. She reports current alcohol use of about 28.0 standard drinks of alcohol per week. She reports that she does not use drugs.  Family History:  Her family history includes Asthma in her brother; Breast cancer in her paternal aunt; Cancer in her father; Colon cancer in her mother; Heart disease in her brother; Skin cancer in her brother; Stroke in her sister.     Assessment/Plan:   Obstructive sleep apnea. - she is compliant with CPAP and reports benefit - she uses [employer] for her DME - her machine is more than 87 years old, and she was using a replacement Philips device -  will see if she can get a new Resmed 11 auto CPAP 5 to 15 cm H2O   Atrial fibrillation. - followed by Dr. Graciela Husbands with Cardiology  Time Spent Involved in Patient Care on Day of Examination:  17 minutes  Follow up:   Patient Instructions  Will arrange for a new Resmed auto CPAP machine  Follow up in 4 months.  Medication List:   Allergies as of 01/06/2023       Reactions   Citalopram Hydrobromide Other (See Comments)    Other reaction(s): QT prolongation with Tikosyn   Adhesive [tape] Swelling, Rash   Bacitracin-polymyxin B Other (See Comments)   Other reaction(s): Unknown   Benzalkonium Chloride Rash   Pt was not aware of this allergy   Neosporin [neomycin-polymyxin-gramicidin] Swelling, Rash        Medication List        Accurate as of January 06, 2023  4:06 PM. If you have any questions, ask your nurse or doctor.          acetaminophen 500 MG tablet Commonly known as: TYLENOL Take 500 mg by mouth 2 (two) times daily.   alendronate 70 MG tablet Commonly known as: FOSAMAX Take 1 tablet (70 mg total) by mouth every 7 (seven) days. Take with a full glass of water on an empty stomach. What changed: additional instructions   apixaban 5 MG Tabs tablet Commonly known as: ELIQUIS Take 1 tablet (5 mg total) by mouth 2 (two) times daily.   atorvastatin 40 MG tablet Commonly known as: LIPITOR Take 40 mg by mouth every evening.   B-complex with vitamin C tablet Take 1 tablet by mouth daily with lunch.   Cartia XT 120 MG 24 hr capsule Generic drug: diltiazem TAKE 1 CAPSULE BY MOUTH EVERY MORNING AND 1 CAPSULE DAILY AT BEDTIME What changed: See the new instructions.   dapagliflozin propanediol 10 MG Tabs tablet Commonly known as: Farxiga Take 1 tablet (10 mg total) by mouth daily before breakfast.   furosemide 40 MG tablet Commonly known as: LASIX Take 2 tablets (80 mg total) by mouth every other day.   levothyroxine 88 MCG tablet Commonly known as: SYNTHROID Take 88 mcg by mouth daily before breakfast.   lisinopril 10 MG tablet Commonly known as: ZESTRIL Take 1 tablet (10 mg total) by mouth daily.   Lutein 20 MG Tabs Take 20 mg by mouth daily with breakfast.   magnesium oxide 400 MG tablet Commonly known as: MAG-OX Take 1 tablet (400 mg total) by mouth daily.   Melatonin 10 MG Tabs Take 10-40 mg by mouth at bedtime as needed (sleep).   metoprolol succinate 50 MG 24 hr  tablet Commonly known as: TOPROL-XL TAKE TWO TABLETS BY MOUTH WITH OR FOLLOWING A MEAL THREE TIMES DAILY What changed: See the new instructions.   multivitamin with minerals Tabs tablet Take 1 tablet by mouth daily with breakfast.   potassium chloride 10 MEQ tablet Commonly known as: KLOR-CON Take 1 tablet (10 mEq total) by mouth daily.   PRESERVISION AREDS 2 PO Take 1 capsule by mouth 2 (two) times daily with a meal.   sertraline 50 MG tablet Commonly known as: Zoloft Take 1 tablet (50 mg total) by mouth daily.   Vitamin D3 125 MCG (5000 UT) Tabs Take 5,000 Units by mouth daily with breakfast.        Signature:  Coralyn Helling, MD [facility] Pager - [phone] 01/06/2023, 4:06 PM

## 2023-01-06 NOTE — Patient Instructions (Signed)
Will arrange for a new Resmed auto CPAP machine  Follow up in 4 months 

## 2023-01-15 DIAGNOSIS — M1711 Unilateral primary osteoarthritis, right knee: Secondary | ICD-10-CM | POA: Diagnosis not present

## 2023-01-18 ENCOUNTER — Other Ambulatory Visit: Payer: Self-pay | Admitting: Internal Medicine

## 2023-01-20 MED ORDER — DILTIAZEM HCL ER COATED BEADS 120 MG PO CP24: ORAL_CAPSULE | ORAL | 3 refills | Status: DC

## 2023-02-04 ENCOUNTER — Other Ambulatory Visit: Payer: Self-pay

## 2023-02-04 ENCOUNTER — Emergency Department (HOSPITAL_COMMUNITY)
Admission: EM | Admit: 2023-02-04 | Discharge: 2023-02-05 | Disposition: A | Discharge status: Home / Self Care | Payer: Medicare Other | Attending: Emergency Medicine | Admitting: Emergency Medicine

## 2023-02-04 ENCOUNTER — Emergency Department (HOSPITAL_COMMUNITY): Payer: Medicare Other

## 2023-02-04 ENCOUNTER — Encounter (HOSPITAL_COMMUNITY): Payer: Self-pay

## 2023-02-04 DIAGNOSIS — R55 Syncope and collapse: Secondary | ICD-10-CM | POA: Insufficient documentation

## 2023-02-04 DIAGNOSIS — R0602 Shortness of breath: Secondary | ICD-10-CM | POA: Diagnosis not present

## 2023-02-04 DIAGNOSIS — S0003XA Contusion of scalp, initial encounter: Secondary | ICD-10-CM | POA: Insufficient documentation

## 2023-02-04 DIAGNOSIS — I110 Hypertensive heart disease with heart failure: Secondary | ICD-10-CM | POA: Diagnosis not present

## 2023-02-04 DIAGNOSIS — Y92000 Kitchen of unspecified non-institutional (private) residence as  the place of occurrence of the external cause: Secondary | ICD-10-CM | POA: Insufficient documentation

## 2023-02-04 DIAGNOSIS — S0990XA Unspecified injury of head, initial encounter: Secondary | ICD-10-CM | POA: Diagnosis not present

## 2023-02-04 DIAGNOSIS — E039 Hypothyroidism, unspecified: Secondary | ICD-10-CM | POA: Insufficient documentation

## 2023-02-04 DIAGNOSIS — Z79899 Other long term (current) drug therapy: Secondary | ICD-10-CM | POA: Insufficient documentation

## 2023-02-04 DIAGNOSIS — I6782 Cerebral ischemia: Secondary | ICD-10-CM | POA: Diagnosis not present

## 2023-02-04 DIAGNOSIS — W01198A Fall on same level from slipping, tripping and stumbling with subsequent striking against other object, initial encounter: Secondary | ICD-10-CM | POA: Diagnosis not present

## 2023-02-04 DIAGNOSIS — Z950 Presence of cardiac pacemaker: Secondary | ICD-10-CM | POA: Insufficient documentation

## 2023-02-04 DIAGNOSIS — I10 Essential (primary) hypertension: Secondary | ICD-10-CM | POA: Diagnosis not present

## 2023-02-04 DIAGNOSIS — Z7901 Long term (current) use of anticoagulants: Secondary | ICD-10-CM | POA: Diagnosis not present

## 2023-02-04 DIAGNOSIS — I5030 Unspecified diastolic (congestive) heart failure: Secondary | ICD-10-CM | POA: Diagnosis not present

## 2023-02-04 DIAGNOSIS — Z96649 Presence of unspecified artificial hip joint: Secondary | ICD-10-CM | POA: Diagnosis not present

## 2023-02-04 DIAGNOSIS — I700 Atherosclerosis of aorta: Secondary | ICD-10-CM | POA: Diagnosis not present

## 2023-02-04 DIAGNOSIS — I517 Cardiomegaly: Secondary | ICD-10-CM | POA: Diagnosis not present

## 2023-02-04 DIAGNOSIS — W19XXXA Unspecified fall, initial encounter: Secondary | ICD-10-CM | POA: Diagnosis not present

## 2023-02-04 DIAGNOSIS — R4182 Altered mental status, unspecified: Secondary | ICD-10-CM | POA: Diagnosis not present

## 2023-02-04 LAB — CBC WITH DIFFERENTIAL/PLATELET
Abs Immature Granulocytes: 0.07 10*3/uL (ref 0.00–0.07)
Basophils Absolute: 0 10*3/uL (ref 0.0–0.1)
Basophils Relative: 1 %
Eosinophils Absolute: 0.1 10*3/uL (ref 0.0–0.5)
Eosinophils Relative: 2 %
HCT: 42.8 % (ref 36.0–46.0)
Hemoglobin: 13.9 g/dL (ref 12.0–15.0)
Immature Granulocytes: 1 %
Lymphocytes Relative: 14 %
Lymphs Abs: 0.9 10*3/uL (ref 0.7–4.0)
MCH: 31.1 pg (ref 26.0–34.0)
MCHC: 32.5 g/dL (ref 30.0–36.0)
MCV: 95.7 fL (ref 80.0–100.0)
Monocytes Absolute: 1 10*3/uL (ref 0.1–1.0)
Monocytes Relative: 15 %
Neutro Abs: 4.5 10*3/uL (ref 1.7–7.7)
Neutrophils Relative %: 67 %
Platelets: 306 10*3/uL (ref 150–400)
RBC: 4.47 MIL/uL (ref 3.87–5.11)
RDW: 13.6 % (ref 11.5–15.5)
WBC: 6.6 10*3/uL (ref 4.0–10.5)
nRBC: 0 % (ref 0.0–0.2)

## 2023-02-04 LAB — COMPREHENSIVE METABOLIC PANEL
ALT: 19 U/L (ref 0–44)
AST: 26 U/L (ref 15–41)
Albumin: 3.9 g/dL (ref 3.5–5.0)
Alkaline Phosphatase: 92 U/L (ref 38–126)
Anion gap: 13 (ref 5–15)
BUN: 9 mg/dL (ref 8–23)
CO2: 24 mmol/L (ref 22–32)
Calcium: 10.2 mg/dL (ref 8.9–10.3)
Chloride: 103 mmol/L (ref 98–111)
Creatinine, Ser: 0.71 mg/dL (ref 0.44–1.00)
GFR, Estimated: >60 mL/min (ref >60)
Glucose, Bld: 105 mg/dL — ABNORMAL HIGH (ref 70–99)
Potassium: 3.4 mmol/L — ABNORMAL LOW (ref 3.5–5.1)
Sodium: 140 mmol/L (ref 135–145)
Total Bilirubin: 0.4 mg/dL (ref 0.3–1.2)
Total Protein: 7.3 g/dL (ref 6.5–8.1)

## 2023-02-04 LAB — TROPONIN I (HIGH SENSITIVITY)
Troponin I (High Sensitivity): 12 ng/L (ref <18)
Troponin I (High Sensitivity): 13 ng/L (ref <18)

## 2023-02-04 LAB — BRAIN NATRIURETIC PEPTIDE: B Natriuretic Peptide: 411.3 pg/mL — ABNORMAL HIGH (ref 0.0–100.0)

## 2023-02-04 NOTE — ED Provider Notes (Signed)
[place] EMERGENCY DEPARTMENT AT [facility] Provider Note   CSN: [id number] Arrival date & time: 02/04/23  1903     History  Chief Complaint  Patient presents with   fall on thinners    Brittany [name] is a 89 y.o. female.  Pt is a 88y/o female PMH permanent A-fib on Eliquis, sinus node dysfunction status post PPM, HFpEF, hypertension, hyperlipidemia, depression, GERD, hypothyroidism, OSA, PVD, stroke presenting today as a level 2 trauma for fall on thinners and head injury.  Patient reports that she remembers her husband stating he was going to have dinner and she was walking to the kitchen and then remembers waking up on the floor.  Her husband reports that he heard a crash and went in and found her on the ground in the kitchen with her walker still in the hallway.  They said there was a dent from her head and the wall but patient is denying any areas of pain.  She had a very similar episode in May of this year.  At that time she was admitted her echo, pacemaker interrogation, EEG and labs all without acute findings.  They were thinking patient may have had a orthostatic event.  Patient reports earlier today she was in her normal state of health but does report since having her hip replacement she was having ongoing exertional shortness of breath and she reports nobody can really tell her why this is happened but that she just is out of shape.  She has not had any chest pain, palpitations but does report over the last 2 weeks she has had URI symptoms and a cough that has been gradually improving.  She denies any recent medication changes and does wear CPAP at night.  The history is provided by the patient.       Home Medications Prior to Admission medications   Medication Sig Start Date End Date Taking? Authorizing Provider  acetaminophen (TYLENOL) 500 MG tablet Take 500 mg by mouth 2 (two) times daily.   Yes [provider]  alendronate (FOSAMAX) 70 MG tablet Take  1 tablet (70 mg total) by mouth every 7 (seven) days. Take with a full glass of water on an empty stomach. Patient taking differently: Take 70 mg by mouth every 7 (seven) days. Take with a full glass of water on an empty stomach. Wednesday 03/04/17  Yes Kuneff, Renee A, DO  apixaban (ELIQUIS) 5 MG TABS tablet Take 1 tablet (5 mg total) by mouth 2 (two) times daily. 12/16/22  Yes Lewie Chamber, MD  atorvastatin (LIPITOR) 40 MG tablet Take 40 mg by mouth every evening.   Yes [provider]  B Complex-C (B-COMPLEX WITH VITAMIN C) tablet Take 1 tablet by mouth daily with lunch.    Yes [provider]  Cholecalciferol (VITAMIN D3) 125 MCG (5000 UT) TABS Take 5,000 Units by mouth daily with breakfast.   Yes [provider]  dapagliflozin propanediol (FARXIGA) 10 MG TABS tablet Take 1 tablet (10 mg total) by mouth daily before breakfast. 02/18/22  Yes Sheilah Pigeon, PA-C  diltiazem (CARTIA XT) 120 MG 24 hr capsule TAKE 1 CAPSULE BY MOUTH EVERY MORNING AND 1 CAPSULE DAILY AT BEDTIME Patient taking differently: Take 120 mg by mouth See admin instructions. TAKE 1 CAPSULE BY MOUTH EVERY MORNING AND 1 CAPSULE DAILY AT BEDTIME 01/20/23  Yes Duke Salvia, MD  furosemide (LASIX) 40 MG tablet Take 2 tablets (80 mg total) by mouth every other day.  11/29/22  Yes Duke Salvia, MD  levothyroxine (SYNTHROID) 88 MCG tablet Take 88 mcg by mouth daily before breakfast.   Yes [provider]  lisinopril (ZESTRIL) 10 MG tablet Take 1 tablet (10 mg total) by mouth daily. 01/23/22  Yes Nahser, Deloris Ping, MD  Lutein 20 MG TABS Take 20 mg by mouth daily with breakfast.   Yes [provider]  magnesium oxide (MAG-OX) 400 MG tablet Take 1 tablet (400 mg total) by mouth daily. 03/21/14  Yes Duke Salvia, MD  Melatonin 10 MG TABS Take 10-40 mg by mouth at bedtime as needed (sleep).   Yes [provider]  metoprolol succinate (TOPROL-XL) 50 MG 24 hr tablet TAKE TWO TABLETS BY  MOUTH WITH OR FOLLOWING A MEAL THREE TIMES DAILY Patient taking differently: Take 100 mg by mouth with breakfast, with lunch, and with evening meal. or following meals 10/03/22  Yes Duke Salvia, MD  Multiple Vitamin (MULTIVITAMIN WITH MINERALS) TABS tablet Take 1 tablet by mouth daily with breakfast.    Yes [provider]  Multiple Vitamins-Minerals (PRESERVISION AREDS 2 PO) Take 1 capsule by mouth 2 (two) times daily with a meal.    Yes [provider]  potassium chloride (KLOR-CON) 10 MEQ tablet Take 1 tablet (10 mEq total) by mouth daily. 09/19/22  Yes Duke Salvia, MD  sertraline (ZOLOFT) 50 MG tablet Take 1 tablet (50 mg total) by mouth daily. 03/22/19 12/01/23 Yes Newman Nip, NP      Allergies    Citalopram hydrobromide, Adhesive [tape], Bacitracin-polymyxin b, Benzalkonium chloride, and Neosporin [neomycin-polymyxin-gramicidin]    Review of Systems   Review of Systems  Physical Exam Updated Vital Signs BP (!) 158/66 (BP Location: Right Arm)   Pulse 74   Temp 98.7 F (37.1 C) (Oral)   Resp 20   Ht 5\' 8"  (1.727 m)   Wt 95.7 kg   LMP 07/22/1968   SpO2 91%   BMI 32.08 kg/m  Physical Exam Vitals and nursing note reviewed.  Constitutional:      General: She is not in acute distress.    Appearance: She is well-developed.  HENT:     Head: Normocephalic and atraumatic.     Comments: Small hematoma noted to the occipital scalp Eyes:     Pupils: Pupils are equal, round, and reactive to light.  Cardiovascular:     Rate and Rhythm: Normal rate and regular rhythm.     Heart sounds: Normal heart sounds. No murmur heard.    No friction rub.  Pulmonary:     Effort: Pulmonary effort is normal. Tachypnea present.     Breath sounds: Examination of the right-upper field reveals rales. Examination of the right-middle field reveals rales. Rales present. No wheezing.     Comments: Pacemaker present in the left upper chest Abdominal:     General: Bowel sounds  are normal. There is no distension.     Palpations: Abdomen is soft.     Tenderness: There is no abdominal tenderness. There is no guarding or rebound.  Musculoskeletal:        General: No tenderness. Normal range of motion.     Right lower leg: Edema present.     Left lower leg: Edema present.     Comments: Trace pitting edema at the ankles  Skin:    General: Skin is warm and dry.     Findings: No rash.  Neurological:     Mental Status: She is alert and  oriented to person, place, and time.     Cranial Nerves: No cranial nerve deficit.  Psychiatric:        Mood and Affect: Mood normal.        Behavior: Behavior normal.     ED Results / Procedures / Treatments   Labs (all labs ordered are listed, but only abnormal results are displayed) Labs Reviewed  COMPREHENSIVE METABOLIC PANEL - Abnormal; Notable for the following components:      Result Value   Potassium 3.4 (*)    Glucose, Bld 105 (*)    All other components within normal limits  BRAIN NATRIURETIC PEPTIDE - Abnormal; Notable for the following components:   B Natriuretic Peptide 411.3 (*)    All other components within normal limits  CBC WITH DIFFERENTIAL/PLATELET  TROPONIN I (HIGH SENSITIVITY)  TROPONIN I (HIGH SENSITIVITY)    EKG EKG Interpretation Date/Time:  Tuesday February 04 2023 19:25:41 EDT Ventricular Rate:  67 PR Interval:    QRS Duration:  106 QT Interval:  434 QTC Calculation: 465 R Axis:   60  Text Interpretation: VENTRICULAR PACED RHYTHM No further rhythm analysis attempted due to paced rhythm Nonspecific repol abnormality, diffuse leads Confirmed by Gwyneth Sprout ([id number]) on 02/04/2023 7:49:33 PM  Radiology CT Head Wo Contrast  Result Date: 02/04/2023 CLINICAL DATA:  Head trauma, fall on thinners. EXAM: CT HEAD WITHOUT CONTRAST TECHNIQUE: Contiguous axial images were obtained from the base of the skull through the vertex without intravenous contrast. RADIATION DOSE REDUCTION: This exam was  performed according to the departmental dose-optimization program which includes automated exposure control, adjustment of the mA and/or kV according to patient size and/or use of iterative reconstruction technique. COMPARISON:  12/02/2022. FINDINGS: Brain: No acute intracranial hemorrhage, midline shift or mass effect. The previously described subdural hematoma on the left is no longer seen. No extra-axial fluid collection. Diffuse atrophy is noted. Extensive subcortical and periventricular white matter hypodensities are present bilaterally. No hydrocephalus. Vascular: No hyperdense vessel or unexpected calcification. Skull: Normal. Negative for fracture or focal lesion. Sinuses/Orbits: There is partial opacification of the ethmoid air cells, sphenoid sinuses, and maxillary sinuses. No acute orbital abnormality. Other: None. IMPRESSION: 1. No acute intracranial process. 2. Atrophy with chronic microvascular ischemic changes. Electronically Signed   By: Thornell Sartorius M.D.   On: 02/04/2023 20:21   DG Chest Port 1 View  Result Date: 02/04/2023 CLINICAL DATA:  Syncope, chest crackles. EXAM: PORTABLE CHEST 1 VIEW COMPARISON:  12/09/2022. FINDINGS: The heart is enlarged and the mediastinal contour is within normal limits. There is atherosclerotic calcification of the aorta. Lung volumes are low with mild atelectasis at the lung bases. No effusion or pneumothorax. No acute osseous abnormality. A dual lead pacemaker is present over the left chest. IMPRESSION: No active disease. Electronically Signed   By: Thornell Sartorius M.D.   On: 02/04/2023 20:17    Procedures Procedures    Medications Ordered in ED Medications - No data to display  ED Course/ Medical Decision Making/ A&P                             Medical Decision Making Amount and/or Complexity of Data Reviewed Independent Historian: spouse External Data Reviewed: notes. Labs: ordered. Decision-making details documented in ED Course. Radiology:  ordered and independent interpretation performed. Decision-making details documented in ED Course. ECG/medicine tests: ordered and independent interpretation performed. Decision-making details documented in ED Course.   Pt with multiple medical  problems and comorbidities and presenting today with a complaint that caries a high risk for morbidity and mortality.  Here today with complaints of a syncopal event.  Patient does not remember the event at all.  She did have a very similar episode in May of this year without definitive cause but thought possibly from orthostatic hypotension.  Patient has been ill with URI symptoms over the last 2 weeks but reports that is improving.  She is well-appearing on exam and is able to answer all questions.  She was a level 2 trauma because she hit her head and she is on Eliquis.  She does not appear to have any other significant injuries.  She is able to move her upper and lower extremities without difficulty.  Unclear what caused this event.  She is amnestic to the event.  Will interrogate the pacemaker to ensure no dysrhythmia, I independently interpreted patient's EKG today which shows no significant changes and a paced rhythm is present.  She is on continually cardiac monitoring and heart rate has been greater then 60 while in the room.  She does not display symptoms of significant fluid overload today but does have some rales and crackles on the right side and concern for possible pneumonia.  She is on Eliquis and reports the shortness of breath that she is experiencing has been present since her hip replacement.  She is low risk for DVT given her anticoagulation.  Will evaluate for electrolyte abnormalities or other causes for syncope.  Blood pressure is stable here.  Will image her brain to ensure no injury as her last event in May caused a small subdural hemorrhage that resolved spontaneously.  12:02 AM I have independently visualized and interpreted pt's images  today.  Head CT today without evidence of intracranial bleed, chest x-ray without acute findings and no evidence of pneumonia.  I independently interpreted patient's labs and BMP unchanged at 411, troponin x 2 negative at 12 and 13, CBC within normal limits without evidence of anemia, CMP within normal limits.  Pacemaker interrogation showed that patient has multiple episodes of atrial fibrillation over the last week including today but no VT events, or fast A&V events.  Patient was able to stand and walk care with her walker denied having any unusual symptoms.  She is at her baseline and her husband agrees with this.  He reports today he heard her fall and when he got into the kitchen she was already awake and this event is not as bad as the one she had in May.  At this time feel that patient is stable for discharge home however will do referrals for neurology and cardiology given this is the second event she has had is unclear the exact nature of what caused this.  She and her husband are comfortable with this plan.  Did caution if she has any further events she needs to return to the emergency room.  Patient was able to ambulate here without significant difficulty.  No recurrent symptoms.          Final Clinical Impression(s) / ED Diagnoses Final diagnoses:  Syncope, unspecified syncope type    Rx / DC Orders ED Discharge Orders          Ordered    Ambulatory referral to Neurology       Comments: An appointment is requested in approximately: 4 weeks   02/05/23 0001    Ambulatory referral to Cardiology       Comments: Recurrent  syncope   02/05/23 0001              Gwyneth Sprout, MD 02/05/23 0003

## 2023-02-04 NOTE — ED Notes (Signed)
Patient transported to CT 

## 2023-02-04 NOTE — Progress Notes (Signed)
   02/04/23 1930  Spiritual Encounters  Type of Visit Initial  Care provided to: Pt not available  Referral source Trauma page  Reason for visit Trauma  OnCall Visit No   Chaplain responded to a level two trauma. The patient was attended to by the medical team. If a chaplain is requested someone will respond.   Valerie Roys [facility]  [phone]

## 2023-02-04 NOTE — ED Notes (Signed)
Trauma Response Nurse Documentation   Brittany [name] is a 89 y.o. female arriving to [facility] ED via [facility]  On clopidogrel 75 mg daily. Trauma was activated as a Level 2 by charge based on the following trauma criteria Elderly patients > 65 with head trauma on anti-coagulation (excluding ASA).  Patient cleared for CT by Dr. Anitra Lauth. Pt transported to CT with Primary RN. RN remained with the patient throughout their absence from the department for clinical observation.   GCS 15.  History   Past Medical History:  Diagnosis Date   Ankle fracture 2016   Arthritis    Atrial fibrillation (HCC)    Depression    Dysrhythmia    Elevated parathyroid hormone    Fracture of orbital floor with routine healing    GERD (gastroesophageal reflux disease)    History of colon polyps    benign   Hypercalcemia    Hypertension    Hyponatremia    Hypothyroid    Macular degeneration    wet in the right and dry in the left   OSA on CPAP    Osteoporosis    Peripheral vascular disease (HCC)    Presence of permanent cardiac pacemaker    PSVT (paroxysmal supraventricular tachycardia)    Rotator cuff tear    Sinus node dysfunction (HCC)    a. s/p MDT pacemaker   Stroke [facility])    Urinary incontinence    Wrist fracture 2018     Past Surgical History:  Procedure Laterality Date   BACK SURGERY     CARDIOVERSION N/A 05/02/2022   Procedure: CARDIOVERSION;  Surgeon: Chrystie Nose, MD;  Location: [facility] ENDOSCOPY;  Service: Cardiovascular;  Laterality: N/A;   CATARACT EXTRACTION     CATARACT EXTRACTION W/ INTRAOCULAR LENS  IMPLANT, BILATERAL Bilateral    COLONOSCOPY     ESOPHAGOGASTRODUODENOSCOPY     IR ANGIO INTRA EXTRACRAN SEL COM CAROTID INNOMINATE BILAT MOD SED  12/30/2017   IR ANGIO VERTEBRAL SEL VERTEBRAL BILAT MOD SED  12/30/2017   LAPAROSCOPIC CHOLECYSTECTOMY  08/11/1999   LUMBAR DISC SURGERY  02/19/2014   ORIF ANKLE FRACTURE Left 04/13/2015   Procedure: OPEN REDUCTION  INTERNAL FIXATION (ORIF) LEFT ANKLE FRACTURE;  Surgeon: Cammy Copa, MD;  Location: [facility] OR;  Service: Orthopedics;  Laterality: Left;   PACEMAKER PLACEMENT Right 07/22/2010   a. MDT dual chamber PPM implanted by Dr Graciela Husbands    [facility] GENERATOR CHANGEOUT N/A 08/22/2022   Procedure: PPM GENERATOR Janeann Merl;  Surgeon: Duke Salvia, MD;  Location: [facility] INVASIVE CV LAB;  Service: Cardiovascular;  Laterality: N/A;   SHOULDER ARTHROSCOPY W/ ROTATOR CUFF REPAIR Right 08/30/2014   WITH MINI-OPEN ROTATOR CUFF REPAIR AND SUBACROMIAL DECOMPRESSION   SHOULDER ARTHROSCOPY WITH ROTATOR CUFF REPAIR AND SUBACROMIAL DECOMPRESSION Right 08/30/2014   Procedure: SHOULDER ARTHROSCOPY WITH MINI-OPEN ROTATOR CUFF REPAIR AND SUBACROMIAL DECOMPRESSION;  Surgeon: Cammy Copa, MD;  Location: [facility] OR;  Service: Orthopedics;  Laterality: Right;  RIGHT SHOULDER DIAGNOSTIC OPERATIVE ARTHROSCOPY, SUBACROMIAL DECOMPRESSION, MINI-OPEN ROTATOR CUFF REPAIR.   TOTAL HIP ARTHROPLASTY Right 08/14/2021   Procedure: TOTAL HIP ARTHROPLASTY ANTERIOR APPROACH;  Surgeon: Marcene Corning, MD;  Location: [facility] ORS;  Service: Orthopedics;  Laterality: Right;   TOTAL KNEE ARTHROPLASTY Left 02/13/2021   Procedure: LEFT TOTAL KNEE ARTHROPLASTY;  Surgeon: Marcene Corning, MD;  Location: [facility] ORS;  Service: Orthopedics;  Laterality: Left;   VAGINAL HYSTERECTOMY  07/22/1968   WRIST FRACTURE SURGERY Left        Initial Focused  Assessment (If applicable, or please see trauma documentation):  Airway - Clear Breathing -Crackles in right bases per Dr. Anitra Lauth Circulation - No bleeding noted GCS 15   CT's Completed:   CT Head and CT C-Spine   Interventions:  Labs Xrays CT scans Pacemaker interrogation   Event Summary:  Syncopal episode , found on floor by husband, pt is unsure of events,     Bedside handoff with ED RN Rodney Booze.    Lesle Chris Giannie Soliday  Trauma Response RN  Please call TRN at [phone] for further assistance.

## 2023-02-04 NOTE — ED Notes (Signed)
PT was at home and was walking into the kitchen , fell hit her head on floor.Husband heard her from the other room and when he walked in she was awake.PT does not remember falling and did have LOC.PT is on eliquis, of fluid was giiven , crackles heard in right base

## 2023-02-05 NOTE — ED Notes (Signed)
Pt ambulated in hall with no issues.

## 2023-02-05 NOTE — Discharge Instructions (Signed)
All the blood work today, the interrogation of your pacemaker, head scans all look normal.  Make sure you are staying hydrated and having enough to eat.  Use Tylenol as needed if your ankle is hurting you.  Continue your current medications.  Referrals were sent to the cardiologist and neurologist and they should contact you for follow-up appointments.  If you have any further episodes of passing out you should return to the emergency room or if you start having worsening shortness of breath, chest pain, persistent vomiting or other concerns.

## 2023-03-04 NOTE — Telephone Encounter (Signed)
She last saw Dr. Graciela Husbands, and seemed at that time to be feeling better.  I see she had had an admission and ER visit with falls, faints, she should be seen.  SOB has been felt to be mostly 2/2 deconditioning, so I don't think stopping the Marcelline Deist is unreasonable.  Please see if she can be seen by Dr. Graciela Husbands earlier then her scheduled visit.  Renee

## 2023-03-13 ENCOUNTER — Ambulatory Visit (INDEPENDENT_AMBULATORY_CARE_PROVIDER_SITE_OTHER): Payer: Medicare Other

## 2023-03-13 DIAGNOSIS — I495 Sick sinus syndrome: Secondary | ICD-10-CM | POA: Diagnosis not present

## 2023-03-13 LAB — CUP PACEART REMOTE DEVICE CHECK
Battery Remaining Longevity: 145 mo
Battery Voltage: 3.14 V
Brady Statistic RA Percent Paced: 0 %
Brady Statistic RV Percent Paced: 80.64 %
Date Time Interrogation Session: 20240821233417
Implantable Lead Connection Status: 753985
Implantable Lead Connection Status: 753985
Implantable Lead Implant Date: 20121213
Implantable Lead Implant Date: 20121213
Implantable Lead Location: 753859
Implantable Lead Location: 753860
Implantable Lead Model: 4076
Implantable Lead Model: 4076
Implantable Lead Serial Number: 835025
Implantable Lead Serial Number: 869269
Implantable Pulse Generator Implant Date: 20240201
Lead Channel Impedance Value: 285 Ohm
Lead Channel Impedance Value: 342 Ohm
Lead Channel Impedance Value: 342 Ohm
Lead Channel Impedance Value: 456 Ohm
Lead Channel Pacing Threshold Amplitude: 1.125 V
Lead Channel Pacing Threshold Pulse Width: 0.4 ms
Lead Channel Sensing Intrinsic Amplitude: 0.375 mV
Lead Channel Sensing Intrinsic Amplitude: 0.375 mV
Lead Channel Sensing Intrinsic Amplitude: 10.25 mV
Lead Channel Sensing Intrinsic Amplitude: 10.25 mV
Lead Channel Setting Pacing Amplitude: 2.25 V
Lead Channel Setting Pacing Pulse Width: 0.4 ms
Lead Channel Setting Sensing Sensitivity: 0.9 mV
Zone Setting Status: 755011
Zone Setting Status: 755011

## 2023-03-16 NOTE — Progress Notes (Unsigned)
Cardiology Office Note Date:  03/16/2023  Patient ID:  Brittany [name], Brittany [name] [birth year], MRN [id number] PCP:  Deatra James, MD  Electrophysiologist: Dr. Graciela Husbands    Chief Complaint:  *** post ER  History of Present Illness: Brittany [name] is a 89 y.o. female with history of stroke, SSSx w/PPM, Afib, ATach, HTN, HLD, hypothyroidism, OSA w/CPAP  07/30/21 Hgb 13.4 > HIP SURGERY  on 1/24 > 1/25 8.1, 9.0 > 08/22/21 8.8   She comes in today to be seen for Dr. Graciela Husbands, last seen by him 08/23/21, she had recently seen pulmonary with symptoms /signs of volume OL and started on furosemide. C/o palpitations, DOE as well as orthopedic pains He was unconvinced of HFpEF, though recommended to give the lasix a try for a week.  Also noted persistent HR 80's, suspect AT and her metoprolol increased to 150mg  BID, her ACE stopped Tikosyn continued  Saw pulm team 08/29/21, SOB better but not resolved, edema was resolved.  She was off lasix (at Dr. Odessa Fleming recommendation), suspected volume did play a roll though as well as deconditioning after orthopedic surgeries. Trial of prn albuterol   Pt called/my chart messages with concerns of lab results and ongoing SOB, started on torsemide 20mg  daily for 5 days and repeat BNP   BNP 3187 > 2448  I saw her 09/26/21 She is accompanied by her husband today. She reports that last year after her knee surgery she noticed a little SOB, but not much and tolerable, after her hip surgery though she has been markedly SOB with minimal exertion. The albuterol did not seem to help The diuretics perhaps though minimally. No CP, no palpitations or cardiac awareness No near syncope or syncope. She has had some longer episodes of Afib of late, she was unaware of them. Her DOE has been unchanged and persistent sione her hip surgery in January, she has not noticed specific days that she is SOB over others.  Suspected SOB multifactorial with a clear CXR, though elevated BNP. Increased AF  burden from her baseline felt triggered by recent surgery and anemia. Planned to check labs, if Hgb was better resume diuretic  Labs were OK, H/H better, BNP elevated and started on furosemide daily with plans for repeat labs  She saw Dr. Craige Cotta 11/06/21, not felt to have any obvious lung disease, and SOB likely CHF, anemia, deconditioning, recommended regular exercise  I saw her 12/21/21 She is accompanied by her husband She thinks she may be starting to feel a little less SOB when ambulating. Whe she is walking with her walker she can walk without SOB or difficulty, and can walk as far as she wants/needs to, but when walking under her own power she gets winded. No rest SOB No CP She denies symptoms of palpitations, but thinks she has had AFib, because when at her MD visit last week they told her she was out of rhythm, she could not tell. No near syncope or syncope. No bleeding. PMD did labs, 12/05/21 her PTH/calcium was elevated and is being referred to a endocrinologist, she recalls this being mentioned years ago by her MD in [place], there was some discussion about surgery but she did not want that. H/H 13/39 BUN/Creat 15/0.67 K+ 4.7  Unclear etiology of her SOB, is more persistent the her AFib is, planned for a stress test evaluate further. Stress test was abnormal with a small area of ischemia apical to mid inferior wall, and felt a low risk study given small  territory. I reviewed with general cardiologist, agreed, given small territory would not pursue cath but try nitrate 1st/manage medically.  Not certain that this defect would cause SOB she describes.  Pt subsequently called with new onset diarrhea with the start of Imdur and advised to stop it and hydrate, follow.   I called 02/12/22 and spoke to the pt, seems the diarrhea is once but every day and only at night, continues, is thin/watery.  No blood.   She had not reached out to PMD and was advised to.  Was planning on trying  immodium, she has tried pepto without relief. She was advised to reach out to her PMD    Saw her 02/13/22 She took the imodium last night and did not have loose stool, feels like her belly is better today. Discussed low likely hood of the Imdur being the etiology, but not impossible. She otherwise remains the same. No erst SOB No orthopnea/PND No SOB when walking with the walker Gets winded with walking under hew own power No near syncope or syncope. She is not convinced the furosemide helps No bleeding D/w Dr. Graciela Husbands She would like to avoid cath, plan for coronary CTa Consider Farxiga or jardiance if able once diarrhea is resolved, if CT does not explain her SOB  Unclear, looks like unable to pursue coronary CT 2/2 her Afib and elevated HRs at the time. Planned to revisit coronary CT if able to get her Afib better managed and/or rate controlled  Started on Farxiga  She saw Dr. Graciela Husbands 03/21/22, also despite increasing Afib burden, did not seem to align with or explain her day-in and day-out SOB. Tikosyn stopped > amiodarone Furosemide increased  Numerous pt encounters via calls/messages TSH became elevated and planned for her PMD to manage, with close f/u on amio +/- need for DCCV   06/02/22: DCCV had 3 shocks with brief periods of sinus only  Most recently patient messages about R eye issues, saw her eye MD that felt noyt associated with her macular degeneration but a vascular issue Multiple messages back/forth including Dr. Graciela Husbands and discussion with neurology. "visual migraine or local retinal pathology. TIAs unlikely give exactly same vascular distribution and very brief duration."  Advised to f/u with   Advised CPX testing to better understand her SOB, though exercise physiologist on maternity leave.  Pt reached out wondering if she may have amyloid....   I saw her 06/05/22,  She c/w the same DOE. No rest SOB, no nocturnal or supine SOB Exertional capacity remains  minimal under her own power, is able to walk further/longer if using a walker, grocery cart. No CP, no palpitations or cardiac awareness No near syncope or syncope. She will feel a little dizzy when standing up from bending over. No bleeding or signs of bleeding Planned for CPX tsting  CPX as below, obesity being primary/significant component for her SOB  Saw Dr. Graciela Husbands 08/21/22, more symptomatic with conversion to VVI 65 (though in AF).  With persistent AFIB, amiodarone stopped  Saw Dr. Graciela Husbands 11/29/22, SOB much improved, pt inquired about stopping Marcelline Deist, wanted to reduce lasix.  Farxiga contrinued given improved symptoms. Lasix to every other day Metoprolol 100mg  TID seemed to have been a communication problem, but doing well and continued  Admitted 12/01/22, syncope vs fall, suffered a small acute left parietal subdural hematoma and underlying moderate severity cerebral atrophy.  Neuro surgery involved, OAC not reversed, but held 2 weeks Reprted d/w cardiology, pacer functioning well, echo reassuring Discharged 12/03/22  Metoprolol dose unchanged   ER visit 02/04/23: fall/syncope, ambulating to the kitchen, reported recent URI symptoms, CT without evidence of intracranial bleed, chest x-ray without acute findings and no evidence of pneumonia. Labs unremarkable.  PPM with AFib, no other arrhythmias Discharged from the ER with instructions to see cardiology and neurology  ***orthostatics *** stop farxiga  *** reduce BB/meds *** bleeding, signs of bleeding *** frequcy of falls....  Device information MDT dual chamber PPM implanted 07/04/11, gen change 08/22/22  AFib Hx Diagnosis goes as far back as 2-14 or earlier  AAD hx Sotalol started perhaps 2014 or earlier stopped with recurrent Afib Tikosyn started Sep 2020 > stopped with increasing AFib burden Amiodarone started Aug 2023 >> stopped Jan 2024, with failure to maintain SR Permanent AFib  Past Medical History:  Diagnosis Date    Ankle fracture 2016   Arthritis    Atrial fibrillation (HCC)    Depression    Dysrhythmia    Elevated parathyroid hormone    Fracture of orbital floor with routine healing    GERD (gastroesophageal reflux disease)    History of colon polyps    benign   Hypercalcemia    Hypertension    Hyponatremia    Hypothyroid    Macular degeneration    wet in the right and dry in the left   OSA on CPAP    Osteoporosis    Peripheral vascular disease (HCC)    Presence of permanent cardiac pacemaker    PSVT (paroxysmal supraventricular tachycardia)    Rotator cuff tear    Sinus node dysfunction (HCC)    a. s/p MDT pacemaker   Stroke [facility])    Urinary incontinence    Wrist fracture 2018    Past Surgical History:  Procedure Laterality Date   BACK SURGERY     CARDIOVERSION N/A 05/02/2022   Procedure: CARDIOVERSION;  Surgeon: Chrystie Nose, MD;  Location: [facility] ENDOSCOPY;  Service: Cardiovascular;  Laterality: N/A;   CATARACT EXTRACTION     CATARACT EXTRACTION W/ INTRAOCULAR LENS  IMPLANT, BILATERAL Bilateral    COLONOSCOPY     ESOPHAGOGASTRODUODENOSCOPY     IR ANGIO INTRA EXTRACRAN SEL COM CAROTID INNOMINATE BILAT MOD SED  12/30/2017   IR ANGIO VERTEBRAL SEL VERTEBRAL BILAT MOD SED  12/30/2017   LAPAROSCOPIC CHOLECYSTECTOMY  08/11/1999   LUMBAR DISC SURGERY  02/19/2014   ORIF ANKLE FRACTURE Left 04/13/2015   Procedure: OPEN REDUCTION INTERNAL FIXATION (ORIF) LEFT ANKLE FRACTURE;  Surgeon: Cammy Copa, MD;  Location: [facility] OR;  Service: Orthopedics;  Laterality: Left;   PACEMAKER PLACEMENT Right 07/22/2010   a. MDT dual chamber PPM implanted by Dr Graciela Husbands    [facility] GENERATOR CHANGEOUT N/A 08/22/2022   Procedure: PPM GENERATOR Janeann Merl;  Surgeon: Duke Salvia, MD;  Location: [facility] INVASIVE CV LAB;  Service: Cardiovascular;  Laterality: N/A;   SHOULDER ARTHROSCOPY W/ ROTATOR CUFF REPAIR Right 08/30/2014   WITH MINI-OPEN ROTATOR CUFF REPAIR AND SUBACROMIAL DECOMPRESSION   SHOULDER ARTHROSCOPY  WITH ROTATOR CUFF REPAIR AND SUBACROMIAL DECOMPRESSION Right 08/30/2014   Procedure: SHOULDER ARTHROSCOPY WITH MINI-OPEN ROTATOR CUFF REPAIR AND SUBACROMIAL DECOMPRESSION;  Surgeon: Cammy Copa, MD;  Location: [facility] OR;  Service: Orthopedics;  Laterality: Right;  RIGHT SHOULDER DIAGNOSTIC OPERATIVE ARTHROSCOPY, SUBACROMIAL DECOMPRESSION, MINI-OPEN ROTATOR CUFF REPAIR.   TOTAL HIP ARTHROPLASTY Right 08/14/2021   Procedure: TOTAL HIP ARTHROPLASTY ANTERIOR APPROACH;  Surgeon: Marcene Corning, MD;  Location: [facility] ORS;  Service: Orthopedics;  Laterality: Right;   TOTAL KNEE ARTHROPLASTY Left 02/13/2021  Procedure: LEFT TOTAL KNEE ARTHROPLASTY;  Surgeon: Marcene Corning, MD;  Location: [facility] ORS;  Service: Orthopedics;  Laterality: Left;   VAGINAL HYSTERECTOMY  07/22/1968   WRIST FRACTURE SURGERY Left     Current Outpatient Medications  Medication Sig Dispense Refill   acetaminophen (TYLENOL) 500 MG tablet Take 500 mg by mouth 2 (two) times daily.     alendronate (FOSAMAX) 70 MG tablet Take 1 tablet (70 mg total) by mouth every 7 (seven) days. Take with a full glass of water on an empty stomach. (Patient taking differently: Take 70 mg by mouth every 7 (seven) days. Take with a full glass of water on an empty stomach. Wednesday) 4 tablet 11   apixaban (ELIQUIS) 5 MG TABS tablet Take 1 tablet (5 mg total) by mouth 2 (two) times daily. 180 tablet 3   atorvastatin (LIPITOR) 40 MG tablet Take 40 mg by mouth every evening.     B Complex-C (B-COMPLEX WITH VITAMIN C) tablet Take 1 tablet by mouth daily with lunch.      Cholecalciferol (VITAMIN D3) 125 MCG (5000 UT) TABS Take 5,000 Units by mouth daily with breakfast.     diltiazem (CARTIA XT) 120 MG 24 hr capsule TAKE 1 CAPSULE BY MOUTH EVERY MORNING AND 1 CAPSULE DAILY AT BEDTIME (Patient taking differently: Take 120 mg by mouth See admin instructions. TAKE 1 CAPSULE BY MOUTH EVERY MORNING AND 1 CAPSULE DAILY AT BEDTIME) 180 capsule 3   furosemide (LASIX) 40 MG  tablet Take 2 tablets (80 mg total) by mouth every other day. 180 tablet 3   levothyroxine (SYNTHROID) 88 MCG tablet Take 88 mcg by mouth daily before breakfast.     lisinopril (ZESTRIL) 10 MG tablet Take 1 tablet (10 mg total) by mouth daily. 90 tablet 3   Lutein 20 MG TABS Take 20 mg by mouth daily with breakfast.     magnesium oxide (MAG-OX) 400 MG tablet Take 1 tablet (400 mg total) by mouth daily. 30 tablet 0   Melatonin 10 MG TABS Take 10-40 mg by mouth at bedtime as needed (sleep).     metoprolol succinate (TOPROL-XL) 50 MG 24 hr tablet TAKE TWO TABLETS BY MOUTH WITH OR FOLLOWING A MEAL THREE TIMES DAILY (Patient taking differently: Take 100 mg by mouth with breakfast, with lunch, and with evening meal. or following meals) 540 tablet 2   Multiple Vitamin (MULTIVITAMIN WITH MINERALS) TABS tablet Take 1 tablet by mouth daily with breakfast.      Multiple Vitamins-Minerals (PRESERVISION AREDS 2 PO) Take 1 capsule by mouth 2 (two) times daily with a meal.      potassium chloride (KLOR-CON) 10 MEQ tablet Take 1 tablet (10 mEq total) by mouth daily. 90 tablet 3   sertraline (ZOLOFT) 50 MG tablet Take 1 tablet (50 mg total) by mouth daily. 30 tablet 2   No current facility-administered medications for this visit.    Allergies:   Citalopram hydrobromide, Adhesive [tape], Bacitracin-polymyxin b, Benzalkonium chloride, and Neosporin [neomycin-polymyxin-gramicidin]   Social History:  The patient  reports that she quit smoking about 32 years ago. Her smoking use included cigarettes. She started smoking about 77 years ago. She has a 45 pack-year smoking history. She has never used smokeless tobacco. She reports current alcohol use of about 28.0 standard drinks of alcohol per week. She reports that she does not use drugs.   Family History:  The patient's family history includes Asthma in her brother; Breast cancer in her paternal aunt; Cancer in her  father; Colon cancer in her mother; Heart disease in  her brother; Skin cancer in her brother; Stroke in her sister.  ROS:  Please see the history of present illness.    All other systems are reviewed and otherwise negative.   PHYSICAL EXAM:  VS:  LMP 07/22/1968  BMI: There is no height or weight on file to calculate BMI. Well nourished, well developed, in no acute distress HEENT: normocephalic, atraumatic Neck: no JVD, carotid bruits or masses Cardiac: RRR; no significant murmurs, no rubs, or gallops Lungs: CTA b/l, no wheezing, rhonchi or rales Abd: soft, nontender MS: no deformity or atrophy Ext: no edema Skin: warm and dry, no rash Neuro:  No gross deficits appreciated Psych: euthymic mood, full affect  PPM site is stable, no tethering or discomfort   EKG:   not done today  Device interrogation done today and reviewed by myself:  ***  12/02/22: TTE 1. Left ventricular ejection fraction, by estimation, is 60 to 65%. The  left ventricle has normal function. The left ventricle has no regional  wall motion abnormalities. There is mild left ventricular hypertrophy of  the basal-septal segment. Left  ventricular diastolic function could not be evaluated.   2. Right ventricular systolic function is normal. The right ventricular  size is normal. There is normal pulmonary artery systolic pressure.   3. Left atrial size was moderately dilated.   4. Right atrial size was moderately dilated.   5. The mitral valve is normal in structure. Mild mitral valve  regurgitation. No evidence of mitral stenosis.   6. The aortic valve is tricuspid. Aortic valve regurgitation is mild.  Aortic valve sclerosis/calcification is present, without any evidence of  aortic stenosis.   7. The inferior vena cava is dilated in size with >50% respiratory  variability, suggesting right atrial pressure of 8 mmHg.    10/11/22: CPX  Attending: Normal functional capacity when compared to age-matched subjects. When the measured peak VO2 is adjusted for her ideal  body weight there is significant improvement suggesting the patient's obesity is likely contributing significantly to her exercise intolerance. The VE/VCO2 is markedly elevated and can be related to elevated filling pressures during exercise; however given the low PETCO2 suspect may be more related to end-exercise hyperventilation. If symptoms persist, can consider exercise testing with invasive hemodynamic monitoring.    02/06/22: stress myoview   Findings are consistent with ischemia. The study is low risk.   No ST deviation was noted.   LV perfusion is abnormal. There is evidence of ischemia. Defect 1: There is a small defect with mild reduction in uptake present in the apical to mid inferior location(s) that is reversible. There is normal wall motion in the defect area. Consistent with ischemia.   Left ventricular function is normal. Nuclear stress EF: 69 %. The left ventricular ejection fraction is hyperdynamic (>65%). End diastolic cavity size is normal. End systolic cavity size is normal.   Prior study available for comparison from 03/21/2021.   Reversible perfusion defect in apical to mid inferior wall consistent with ischemia Low risk study.  Area of ischemia is small   08/10/21: TTE 1. Left ventricular ejection fraction, by estimation, is 55 to 60%. The  left ventricle has normal function. The left ventricle has no regional  wall motion abnormalities. There is mild asymmetric left ventricular  hypertrophy of the basal and septal  segments. Left ventricular diastolic parameters are consistent with Grade  I diastolic dysfunction (impaired relaxation). The average left  ventricular  global longitudinal strain is -20.1 %. The global longitudinal  strain is normal.   2. Pacing wires in RA/RV . Right ventricular systolic function is normal.  The right ventricular size is normal. There is moderately elevated  pulmonary artery systolic pressure.   3. Left atrial size was moderately dilated.    4. Right atrial size was moderately dilated.   5. The mitral valve is degenerative. Mild to moderate mitral valve  regurgitation. No evidence of mitral stenosis.   6. Tricuspid valve regurgitation is mild to moderate.   7. The aortic valve is tricuspid. There is moderate calcification of the  aortic valve. There is moderate thickening of the aortic valve. Aortic  valve regurgitation is mild. Aortic valve sclerosis/calcification is  present, without any evidence of aortic  stenosis.   8. Aortic dilatation noted. There is mild dilatation of the ascending  aorta, measuring 38 mm.   9. The inferior vena cava is normal in size with greater than 50%  respiratory variability, suggesting right atrial pressure of 3 mmHg.   Comparison(s): 12/31/17 EF 55-60%.    Recent Labs: 04/05/2022: NT-Pro BNP 1,211 12/02/2022: TSH 3.100 12/03/2022: Magnesium 2.1 02/04/2023: ALT 19; B Natriuretic Peptide 411.3; BUN 9; Creatinine, Ser 0.71; Hemoglobin 13.9; Platelets 306; Potassium 3.4; Sodium 140  No results found for requested labs within last 365 days.   CrCl cannot be calculated (Patient's most recent lab result is older than the maximum 21 days allowed.).   Wt Readings from Last 3 Encounters:  02/04/23 210 lb 15.7 oz (95.7 kg)  01/06/23 211 lb (95.7 kg)  12/01/22 211 lb 3.2 oz (95.8 kg)     Other studies reviewed: Additional studies/records reviewed today include: summarized above  ASSESSMENT AND PLAN:  PPM *** Intact function *** No programming changes made  Paroxysmal >> persistent >> permanent AFib ATach CHA2DS2Vasc is 6, on Eliquis, *** appropriately dosed  *** Hypothyroid, perhaps 2/2 amio, lower dose now, perhaps will help this. Her PMD is following her thyroid   HTN *** Looks OK   SOB Likely multifactorial Unclear if her increased AF burden is the cause of her SOB, though don't think so. She can walk without or at least with less SOB using her walker, but gets SOB when  walking un-aided perhaps 50 feet or so This is not escalating or changing, but not getting better No pulmonary process by her w/u with pulm *** Exam does not appear volume OL Stress test with small ischemic defect, low risk, small territory Intolerant of Imdur with diarrhea *** No CP Continue furosemide, farxiga, ? HFpEF CPX testing findings report obesity likely contributing significantly to her exercise intolerance ***  6.  Syncope ***  7. Secondary hypercoagulable state   Disposition: ***     Current medicines are reviewed at length with the patient today.  The patient did not have any concerns regarding medicines.  Norma Fredrickson, PA-C 03/16/2023 10:13 AM     [facility] [address] [place] [address] [phone] (office)  [phone] (fax)

## 2023-03-17 ENCOUNTER — Encounter: Payer: Self-pay | Admitting: Physician Assistant

## 2023-03-17 ENCOUNTER — Ambulatory Visit: Payer: Medicare Other | Attending: Physician Assistant | Admitting: Physician Assistant

## 2023-03-17 VITALS — BP 166/80 | HR 87 | Ht 68.0 in | Wt 214.0 lb

## 2023-03-17 DIAGNOSIS — Z950 Presence of cardiac pacemaker: Secondary | ICD-10-CM | POA: Diagnosis not present

## 2023-03-17 DIAGNOSIS — R55 Syncope and collapse: Secondary | ICD-10-CM | POA: Diagnosis not present

## 2023-03-17 DIAGNOSIS — D6869 Other thrombophilia: Secondary | ICD-10-CM | POA: Insufficient documentation

## 2023-03-17 DIAGNOSIS — I4821 Permanent atrial fibrillation: Secondary | ICD-10-CM | POA: Insufficient documentation

## 2023-03-17 DIAGNOSIS — I4719 Other supraventricular tachycardia: Secondary | ICD-10-CM | POA: Insufficient documentation

## 2023-03-17 LAB — CUP PACEART INCLINIC DEVICE CHECK
Battery Remaining Longevity: 146 mo
Battery Voltage: 3.13 V
Brady Statistic RA Percent Paced: 0 %
Brady Statistic RV Percent Paced: 81.35 %
Date Time Interrogation Session: 20240826124126
Implantable Lead Connection Status: 753985
Implantable Lead Connection Status: 753985
Implantable Lead Implant Date: 20121213
Implantable Lead Implant Date: 20121213
Implantable Lead Location: 753859
Implantable Lead Location: 753860
Implantable Lead Model: 4076
Implantable Lead Model: 4076
Implantable Lead Serial Number: 835025
Implantable Lead Serial Number: 869269
Implantable Pulse Generator Implant Date: 20240201
Lead Channel Impedance Value: 304 Ohm
Lead Channel Impedance Value: 361 Ohm
Lead Channel Impedance Value: 380 Ohm
Lead Channel Impedance Value: 475 Ohm
Lead Channel Pacing Threshold Amplitude: 1.125 V
Lead Channel Pacing Threshold Pulse Width: 0.4 ms
Lead Channel Sensing Intrinsic Amplitude: 0.375 mV
Lead Channel Sensing Intrinsic Amplitude: 0.5 mV
Lead Channel Sensing Intrinsic Amplitude: 10.875 mV
Lead Channel Sensing Intrinsic Amplitude: 9.75 mV
Lead Channel Setting Pacing Amplitude: 2.25 V
Lead Channel Setting Pacing Pulse Width: 0.4 ms
Lead Channel Setting Sensing Sensitivity: 0.9 mV
Zone Setting Status: 755011
Zone Setting Status: 755011

## 2023-03-17 NOTE — Patient Instructions (Signed)
Medication Instructions:   ON WED ONLY: TAKE ONE FUROSEMIDE TABLET AND STOP   STOP TAKING : FUROSEMIDE AND POTASSIUM   *If you need a refill on your cardiac medications before your next appointment, please call your pharmacy*   Lab Work: BMET TODAY    If you have labs (blood work) drawn today and your tests are completely normal, you will receive your results only by: MyChart Message (if you have MyChart) OR A paper copy in the mail If you have any lab test that is abnormal or we need to change your treatment, we will call you to review the results.   Testing/Procedures: NONE ORDERED  TODAY     Follow-Up: At [facility], you and your health needs are our priority.  As part of our continuing mission to provide you with exceptional heart care, we have created designated Provider Care Teams.  These Care Teams include your primary Cardiologist (physician) and Advanced Practice Providers (APPs -  Physician Assistants and Nurse Practitioners) who all work together to provide you with the care you need, when you need it.  We recommend signing up for the patient portal called "MyChart".  Sign up information is provided on this After Visit Summary.  MyChart is used to connect with patients for Virtual Visits (Telemedicine).  Patients are able to view lab/test results, encounter notes, upcoming appointments, etc.  Non-urgent messages can be sent to your provider as well.   To learn more about what you can do with MyChart, go to [email or web address].    Your next appointment:   6-8 week(s)   Provider:   You may see Dr. Graciela Husbands , MD or one of the following Advanced Practice Providers on your designated Care Team:   Francis Dowse, [place]

## 2023-03-18 LAB — BASIC METABOLIC PANEL
BUN/Creatinine Ratio: 14 (ref 12–28)
BUN: 11 mg/dL (ref 8–27)
CO2: 24 mmol/L (ref 20–29)
Calcium: 10.6 mg/dL — ABNORMAL HIGH (ref 8.7–10.3)
Chloride: 98 mmol/L (ref 96–106)
Creatinine, Ser: 0.76 mg/dL (ref 0.57–1.00)
Glucose: 107 mg/dL — ABNORMAL HIGH (ref 70–99)
Potassium: 4.2 mmol/L (ref 3.5–5.2)
Sodium: 139 mmol/L (ref 134–144)
eGFR: 76 mL/min/{1.73_m2} (ref >59)

## 2023-03-24 NOTE — Progress Notes (Unsigned)
[facility]  PATIENT: Brittany [name] DOB: [birth year]  REFERRING DOCTOR OR PCP:  Dr. Wynelle Link SOURCE: Patient, husband, notes from primary care, imaging and lab reports, CT images personally reviewed.  _________________________________   HISTORICAL  CHIEF COMPLAINT:  Chief Complaint  Patient presents with   New Patient (Initial Visit)    Pt in room 10, husband in room.New patient here for Syncope. Pt husband said syncope first happened on mother's day, in July. Husband said pt didn't remember passing out, pt went to ER was confused. Couple of falls in July. Pt reports SOB when walking, uses walker at home, couple lives in independent living.      HISTORY OF PRESENT ILLNESS:  I had the pleasure of seeing your patient, Brittany [name], at [facility] for neurologic consultation regarding her episodes of syncope.  She is an 89 year old woman with chronic A-fib on Eliquis, sinus node dysfunction, status post pacemaker, hypertension, hyperlipidemia, OSA, PVD and history of stroke who presented to the emergency room 02/04/2023 with her second fall and possible syncope in 3 months, the pacemaker interrogation, by report, did not reveal any new rhythm abnormality.  She has had other falls not leading to an ED visit.  With the falls, she does not recall what happens with her falls and her husband has been out of the room at the time.   The first major episode, she did not feel she woke up until she was in the ED.   The second fall, she woke up on the floor but had hit head so came back to the ED.   She denies episodes where she feels lightheaded/faint.  With both major episodes, she was standing less then 2 minutes before the incident.     With all the episodes, she rapidly recovered neurologically.  The husband has not witnessed the onset of the 2 major episodes.  There was no witnessed generalized tonic-clonic activity.  There was no incontinence, tongue  biting.  In June 2019, she had a distal M2 stroke (felt to be embolic from AFib).  She had left facial droop at the time.  She was started on Eliquis  Imaging: CT scan of the head 02/04/2023 showed mild to moderate generalized cortical atrophy and moderately advanced chronic microvascular ischemic change..  The ventriculomegaly was slightly out of proportion to the extent of atrophy.  Some calcification is noted in the left vertebral artery and internal carotid arteries at the skull base.  No acute findings..  The CT scan showed no new change compared to 12/01/2022.   Very small left SDH 11/2022  CT of the cervical spine 12/01/2022 showed advanced degenerative changes with reduced disc height and bone spurs at C4-C5, C5-C6, C6-C7.  There is mild to moderate spinal stenosis at these levels.  Severe calcification is noted at the bifurcation of the internal carotid arteries with significant carotid stenosis.  CT angiogram of the head and neck 12/30/2017 showed a distal right M2 branch occlusion.  Heavily calcified plaque at the bifurcations with 70% proximal right ICA stenosis and severe right vertebral artery origin stenosis.  REVIEW OF SYSTEMS: Constitutional: No fevers, chills, sweats, or change in appetite Eyes: She has macular degeneration.   Ear, nose and throat: No hearing loss, ear pain, nasal congestion, sore throat Cardiovascular: She has atrial fibrillation.  She has a pacemaker. Respiratory:  No shortness of breath at rest or with exertion.   No wheezes.  She has OSA and uses CPAP. GastrointestinaI: No nausea,  vomiting, diarrhea, abdominal pain, fecal incontinence Genitourinary:  No dysuria,.  She has urinary urgency and some incontinence Musculoskeletal:  No neck pain, back pain Integumentary: No rash, pruritus, skin lesions Neurological: as above Psychiatric: No depression at this time.  No anxiety Endocrine: No palpitations, diaphoresis, change in appetite, change in weigh or increased  thirst Hematologic/Lymphatic:  No anemia, purpura, petechiae. Allergic/Immunologic: No itchy/runny eyes, nasal congestion, recent allergic reactions, rashes  ALLERGIES: Allergies  Allergen Reactions   Citalopram Hydrobromide Other (See Comments)    Other reaction(s): QT prolongation with Tikosyn   Adhesive [Tape] Swelling and Rash   Bacitracin-Polymyxin B Other (See Comments)    Other reaction(s): Unknown   Benzalkonium Chloride Rash    Pt was not aware of this allergy   Neosporin [Neomycin-Polymyxin-Gramicidin] Swelling and Rash    HOME MEDICATIONS:  Current Outpatient Medications:    acetaminophen (TYLENOL) 500 MG tablet, Take 500 mg by mouth 2 (two) times daily., Disp: , Rfl:    alendronate (FOSAMAX) 70 MG tablet, Take 1 tablet (70 mg total) by mouth every 7 (seven) days. Take with a full glass of water on an empty stomach., Disp: 4 tablet, Rfl: 11   apixaban (ELIQUIS) 5 MG TABS tablet, Take 1 tablet (5 mg total) by mouth 2 (two) times daily., Disp: 180 tablet, Rfl: 3   atorvastatin (LIPITOR) 40 MG tablet, Take 40 mg by mouth every evening., Disp: , Rfl:    B Complex-C (B-COMPLEX WITH VITAMIN C) tablet, Take 1 tablet by mouth daily with lunch. , Disp: , Rfl:    Cholecalciferol (VITAMIN D3) 125 MCG (5000 UT) TABS, Take 5,000 Units by mouth daily with breakfast., Disp: , Rfl:    diltiazem (CARTIA XT) 120 MG 24 hr capsule, TAKE 1 CAPSULE BY MOUTH EVERY MORNING AND 1 CAPSULE DAILY AT BEDTIME (Patient taking differently: Take 120 mg by mouth See admin instructions. TAKE 1 CAPSULE BY MOUTH EVERY MORNING AND 1 CAPSULE DAILY AT BEDTIME), Disp: 180 capsule, Rfl: 3   levothyroxine (SYNTHROID) 88 MCG tablet, Take 88 mcg by mouth daily before breakfast., Disp: , Rfl:    lisinopril (ZESTRIL) 10 MG tablet, Take 1 tablet (10 mg total) by mouth daily., Disp: 90 tablet, Rfl: 3   Lutein 20 MG TABS, Take 20 mg by mouth daily with breakfast., Disp: , Rfl:    magnesium oxide (MAG-OX) 400 MG tablet, Take  1 tablet (400 mg total) by mouth daily., Disp: 30 tablet, Rfl: 0   Melatonin 10 MG TABS, Take 10-40 mg by mouth at bedtime as needed (sleep)., Disp: , Rfl:    metoprolol succinate (TOPROL-XL) 50 MG 24 hr tablet, TAKE TWO TABLETS BY MOUTH WITH OR FOLLOWING A MEAL THREE TIMES DAILY (Patient taking differently: Take 100 mg by mouth with breakfast, with lunch, and with evening meal. or following meals), Disp: 540 tablet, Rfl: 2   Multiple Vitamin (MULTIVITAMIN WITH MINERALS) TABS tablet, Take 1 tablet by mouth daily with breakfast. , Disp: , Rfl:    Multiple Vitamins-Minerals (PRESERVISION AREDS 2 PO), Take 1 capsule by mouth 2 (two) times daily with a meal. , Disp: , Rfl:    POTASSIUM CHLORIDE ER PO, Take 10 mEq by mouth daily., Disp: , Rfl:    sertraline (ZOLOFT) 50 MG tablet, Take 1 tablet (50 mg total) by mouth daily., Disp: 30 tablet, Rfl: 2  PAST MEDICAL HISTORY: Past Medical History:  Diagnosis Date   Ankle fracture 2016   Arthritis    Atrial fibrillation (HCC)  Depression    Dysrhythmia    Elevated parathyroid hormone    Fracture of orbital floor with routine healing    GERD (gastroesophageal reflux disease)    History of colon polyps    benign   Hypercalcemia    Hypertension    Hyponatremia    Hypothyroid    Macular degeneration    wet in the right and dry in the left   OSA on CPAP    Osteoporosis    Peripheral vascular disease (HCC)    Presence of permanent cardiac pacemaker    PSVT (paroxysmal supraventricular tachycardia)    Rotator cuff tear    Sinus node dysfunction (HCC)    a. s/p MDT pacemaker   Stroke [facility])    Urinary incontinence    Wrist fracture 2018    PAST SURGICAL HISTORY: Past Surgical History:  Procedure Laterality Date   BACK SURGERY     CARDIOVERSION N/A 05/02/2022   Procedure: CARDIOVERSION;  Surgeon: Chrystie Nose, MD;  Location: [facility];  Service: Cardiovascular;  Laterality: N/A;   CATARACT EXTRACTION     CATARACT EXTRACTION W/  INTRAOCULAR LENS  IMPLANT, BILATERAL Bilateral    COLONOSCOPY     ESOPHAGOGASTRODUODENOSCOPY     IR ANGIO INTRA EXTRACRAN SEL COM CAROTID INNOMINATE BILAT MOD SED  12/30/2017   IR ANGIO VERTEBRAL SEL VERTEBRAL BILAT MOD SED  12/30/2017   LAPAROSCOPIC CHOLECYSTECTOMY  08/11/1999   LUMBAR DISC SURGERY  02/19/2014   ORIF ANKLE FRACTURE Left 04/13/2015   Procedure: OPEN REDUCTION INTERNAL FIXATION (ORIF) LEFT ANKLE FRACTURE;  Surgeon: Cammy Copa, MD;  Location: [facility] OR;  Service: Orthopedics;  Laterality: Left;   PACEMAKER PLACEMENT Right 07/22/2010   a. MDT dual chamber PPM implanted by Dr Graciela Husbands    [facility] GENERATOR CHANGEOUT N/A 08/22/2022   Procedure: PPM GENERATOR Janeann Merl;  Surgeon: Duke Salvia, MD;  Location: [facility] INVASIVE CV LAB;  Service: Cardiovascular;  Laterality: N/A;   SHOULDER ARTHROSCOPY W/ ROTATOR CUFF REPAIR Right 08/30/2014   WITH MINI-OPEN ROTATOR CUFF REPAIR AND SUBACROMIAL DECOMPRESSION   SHOULDER ARTHROSCOPY WITH ROTATOR CUFF REPAIR AND SUBACROMIAL DECOMPRESSION Right 08/30/2014   Procedure: SHOULDER ARTHROSCOPY WITH MINI-OPEN ROTATOR CUFF REPAIR AND SUBACROMIAL DECOMPRESSION;  Surgeon: Cammy Copa, MD;  Location: [facility] OR;  Service: Orthopedics;  Laterality: Right;  RIGHT SHOULDER DIAGNOSTIC OPERATIVE ARTHROSCOPY, SUBACROMIAL DECOMPRESSION, MINI-OPEN ROTATOR CUFF REPAIR.   TOTAL HIP ARTHROPLASTY Right 08/14/2021   Procedure: TOTAL HIP ARTHROPLASTY ANTERIOR APPROACH;  Surgeon: Marcene Corning, MD;  Location: [facility] ORS;  Service: Orthopedics;  Laterality: Right;   TOTAL KNEE ARTHROPLASTY Left 02/13/2021   Procedure: LEFT TOTAL KNEE ARTHROPLASTY;  Surgeon: Marcene Corning, MD;  Location: [facility] ORS;  Service: Orthopedics;  Laterality: Left;   VAGINAL HYSTERECTOMY  07/22/1968   WRIST FRACTURE SURGERY Left     FAMILY HISTORY: Family History  Problem Relation Age of Onset   Colon cancer Mother        colon   Cancer Father        unknown type   Stroke Sister    Skin cancer  Brother    Heart disease Brother    Breast cancer Paternal Aunt    Asthma Brother     SOCIAL HISTORY: Social History   Socioeconomic History   Marital status: Married    Spouse name: Not on file   Number of children: 4   Years of education: 12   Highest education level: Not on file  Occupational History   Occupation: Office manager  Comment: retired  Tobacco Use   Smoking status: Former    Current packs/day: 0.00    Average packs/day: 1 pack/day for 45.0 years (45.0 ttl pk-yrs)    Types: Cigarettes    Start date: 51    Quit date: 1992    Years since quitting: 32.6   Smokeless tobacco: Never   Tobacco comments:    quit smoking in 1992  Vaping Use   Vaping status: Never Used  Substance and Sexual Activity   Alcohol use: Yes    Alcohol/week: 28.0 standard drinks of alcohol    Types: 28 Glasses of wine per week    Comment: "16oz wine at dinner time"   Drug use: No   Sexual activity: Yes    Birth control/protection: Surgical  Other Topics Concern   Not on file  Social History Narrative   Brittany [name] lives with her husband at [employer]. They are retired and recently relocated to [facility] from [place]. She has 4 grown children.    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         Right handed   Wear glasses    Drinks 2 cups coffee in am   1 diet coke a day and 1 sprite zero per day    Social Determinants of Health   Financial Resource Strain: Not on file  Food Insecurity: No Food Insecurity (12/02/2022)   Hunger Vital Sign    Worried About Running Out of Food in the Last Year: Never true    Ran Out of Food in the Last Year: Never true  Transportation Needs: No Transportation Needs (12/02/2022)   PRAPARE - Administrator, Civil Service (Medical): No    Lack of Transportation (Non-Medical): No  Physical Activity: Not on file  Stress: Not on file  Social Connections: Not on file  Intimate Partner Violence:  Not At Risk (12/02/2022)   Humiliation, Afraid, Rape, and Kick questionnaire    Fear of Current or Ex-Partner: No    Emotionally Abused: No    Physically Abused: No    Sexually Abused: No       PHYSICAL EXAM  Vitals:   03/25/23 0906  BP: 104/74  Pulse: 66  Weight: 218 lb 8 oz (99.1 kg)  Height: 5\' 8"  (1.727 m)    Body mass index is 33.22 kg/m.  Sitting:   167/71   63 Stand 30s:  181/82    75 Stand 90s:  189/80   76  General: The patient is well-developed and well-nourished and in no acute distress  HEENT:  Head is [place]/AT.  Sclera are anicteric.     Neck: She has carotid bruits.  The neck is nontender.  Cardiovascular: The heart has an irregular rhythm.  Normal S1 and S2. There were no murmurs, gallops or rubs.    Skin: Extremities are without rash or  edema.  Musculoskeletal:  Back is nontender  Neurologic Exam  Mental status: The patient is alert and oriented x 3 at the time of the examination. The patient has apparent normal recent and remote memory, with an apparently normal attention span and concentration ability.   Speech is normal.  Cranial nerves: Extraocular movements are full.   There is good facial sensation to soft touch bilaterally.Facial strength is normal.  Trapezius and sternocleidomastoid strength is normal. No dysarthria is noted.   No obvious hearing deficits are noted.  Motor:  Muscle bulk is normal.   Tone  is normal. Strength is  5 / 5 in all 4 extremities.   Sensory: Sensory testing is intact to pinprick, soft touch and vibration sensation in the arms.  Mildly reduced sensation of the toes  Coordination: Cerebellar testing reveals good finger-nose-finger and heel-to-shin bilaterally.  Gait and station: Station is normal.   Gait is ataxic with near normal stride.  Turn is unbalanced  Cannot tandem.  Does better with walker. Romberg is negative.   Reflexes: Deep tendon reflexes are symmetric and normal in arms and knees and absent at the ankles.    Plantar responses are flexor.    DIAGNOSTIC DATA (LABS, IMAGING, TESTING) - I reviewed patient records, labs, notes, testing and imaging myself where available.  Lab Results  Component Value Date   WBC 6.6 02/04/2023   HGB 13.9 02/04/2023   HCT 42.8 02/04/2023   MCV 95.7 02/04/2023   PLT 306 02/04/2023      Component Value Date/Time   NA 139 03/17/2023 1221   K 4.2 03/17/2023 1221   K 3.9 10/28/2012 1246   CL 98 03/17/2023 1221   CL 100 10/28/2012 1246   CO2 24 03/17/2023 1221   GLUCOSE 107 (H) 03/17/2023 1221   GLUCOSE 105 (H) 02/04/2023 1957   BUN 11 03/17/2023 1221   BUN 9 10/28/2012 1246   CREATININE 0.76 03/17/2023 1221   CREATININE 0.59 (L) 08/14/2016 0930   CALCIUM 10.6 (H) 03/17/2023 1221   CALCIUM 10.4 10/28/2012 1246   PROT 7.3 02/04/2023 1957   PROT 6.8 05/17/2022 1010   ALBUMIN 3.9 02/04/2023 1957   ALBUMIN 4.4 05/17/2022 1010   AST 26 02/04/2023 1957   AST 20 10/28/2012 1246   ALT 19 02/04/2023 1957   ALKPHOS 92 02/04/2023 1957   ALKPHOS 79 10/28/2012 1246   BILITOT 0.4 02/04/2023 1957   BILITOT 0.7 05/17/2022 1010   BILITOT 1.1 10/28/2012 1246   GFRNONAA >60 02/04/2023 1957   GFRNONAA 87 08/14/2016 0930   GFRAA 92 05/02/2020 1444   GFRAA >89 08/14/2016 0930   Lab Results  Component Value Date   CHOL 163 08/14/2018   HDL 69 08/14/2018   LDLCALC 67 08/14/2018   TRIG 133 08/14/2018   CHOLHDL 2.4 08/14/2018   Lab Results  Component Value Date   HGBA1C 5.2 12/31/2017   No results found for: "VITAMINB12" Lab Results  Component Value Date   TSH 3.100 12/02/2022       ASSESSMENT AND PLAN  Paroxysmal atrial fibrillation (HCC)  OSA on CPAP  History of stroke  Bilateral carotid artery stenosis  Stenosis of both vertebral arteries  In summary, Brittany [name] is a 89 year old woman with advanced carotid arteriosclerosis who has had several spells of syncope with fall.  Today on examination, she did not have orthostatic hypotension  though she has had fluctuating blood pressures.  I discussed with her and her husband that due to her carotid and vertebral artery stenosis she may be more susceptible to lower blood pressure.  She does note that all of her episodes of syncope or presyncope have occurred within a couple minutes of standing up.     We discussed considering referral to vascular surgery or interventional neuro radiology but she would be reluctant to take the risk of a procedure given her age.  We did discuss continuing the Eliquis.  Additionally, she is advised to wait for 30 seconds after standing up before she begins to walk  If episodes become more frequent I would consider referral to vascular  surgery or interventional neuroradiology.  Thank you for asking to see Brittany [name].  She will return to see me as needed and call if there are changes.  Ilario Dhaliwal A. Epimenio Foot, MD, [facility] 03/25/2023, 9:00 PM Certified in Neurology, Clinical Neurophysiology, Sleep Medicine and Neuroimaging  [facility] [address], [address] [place], [place] [address] [phone]

## 2023-03-25 ENCOUNTER — Ambulatory Visit (INDEPENDENT_AMBULATORY_CARE_PROVIDER_SITE_OTHER): Payer: Medicare Other | Admitting: Neurology

## 2023-03-25 ENCOUNTER — Encounter: Payer: Self-pay | Admitting: Neurology

## 2023-03-25 VITALS — BP 104/74 | HR 66 | Ht 68.0 in | Wt 218.5 lb

## 2023-03-25 DIAGNOSIS — G4733 Obstructive sleep apnea (adult) (pediatric): Secondary | ICD-10-CM

## 2023-03-25 DIAGNOSIS — Z8673 Personal history of transient ischemic attack (TIA), and cerebral infarction without residual deficits: Secondary | ICD-10-CM | POA: Diagnosis not present

## 2023-03-25 DIAGNOSIS — I6503 Occlusion and stenosis of bilateral vertebral arteries: Secondary | ICD-10-CM | POA: Diagnosis not present

## 2023-03-25 DIAGNOSIS — I6523 Occlusion and stenosis of bilateral carotid arteries: Secondary | ICD-10-CM

## 2023-03-25 DIAGNOSIS — I480 Paroxysmal atrial fibrillation: Secondary | ICD-10-CM | POA: Diagnosis not present

## 2023-03-25 NOTE — Progress Notes (Signed)
Remote pacemaker transmission.   

## 2023-04-01 DIAGNOSIS — I5032 Chronic diastolic (congestive) heart failure: Secondary | ICD-10-CM

## 2023-04-01 DIAGNOSIS — I4821 Permanent atrial fibrillation: Secondary | ICD-10-CM

## 2023-04-02 ENCOUNTER — Telehealth: Payer: Self-pay | Admitting: Internal Medicine

## 2023-04-02 NOTE — Telephone Encounter (Signed)
Patient is returning phone call.  °

## 2023-04-03 ENCOUNTER — Ambulatory Visit: Payer: Medicare Other | Attending: Family Medicine

## 2023-04-03 DIAGNOSIS — I5032 Chronic diastolic (congestive) heart failure: Secondary | ICD-10-CM

## 2023-04-03 DIAGNOSIS — I4821 Permanent atrial fibrillation: Secondary | ICD-10-CM

## 2023-04-04 LAB — BASIC METABOLIC PANEL
BUN/Creatinine Ratio: 15 (ref 12–28)
BUN: 10 mg/dL (ref 8–27)
CO2: 23 mmol/L (ref 20–29)
Calcium: 10.5 mg/dL — ABNORMAL HIGH (ref 8.7–10.3)
Chloride: 98 mmol/L (ref 96–106)
Creatinine, Ser: 0.66 mg/dL (ref 0.57–1.00)
Glucose: 102 mg/dL — ABNORMAL HIGH (ref 70–99)
Potassium: 4.1 mmol/L (ref 3.5–5.2)
Sodium: 139 mmol/L (ref 134–144)
eGFR: 85 mL/min/{1.73_m2} (ref >59)

## 2023-04-09 ENCOUNTER — Telehealth: Payer: Self-pay

## 2023-04-09 DIAGNOSIS — I4819 Other persistent atrial fibrillation: Secondary | ICD-10-CM

## 2023-04-09 NOTE — Telephone Encounter (Signed)
Prescription refill request for Eliquis received via fax from [facility].  [address] Phone: [phone] Fax: [phone]  Indication: Afib  Last office visit: 03/17/23 Keitha Butte)  Scr: 0.66 (04/03/23)  Age: 89 Weight: 99.1kg  Refill request received via fax from [facility]. Anticoagulation clinic unable to refill. If okay with MD, pt will need printed and signed prescription. Will route to Dr Odessa Fleming nurse for review.

## 2023-04-11 MED ORDER — APIXABAN 5 MG PO TABS: 5.0000 mg | ORAL_TABLET | Freq: Two times a day (BID) | ORAL | 3 refills | Status: DC

## 2023-04-11 NOTE — Telephone Encounter (Signed)
Rx printed and mailed to pt

## 2023-04-11 NOTE — Telephone Encounter (Signed)
Age: 89 yo Weight: 99.1 kg  Scr: 0.66, 04/03/2023 Indication: afib  Per dosing criteria pt is on the correct dose of Eliquis 5mg ,  BID

## 2023-04-11 NOTE — Telephone Encounter (Signed)
Spoke with pt and advised we are unable to send Rx to [facility] but will mail Rx to pt or pt may pick up from [facility] office.  Pt verbalizes understanding and requests office to mail Rx.  Pt's address confirmed.

## 2023-04-14 ENCOUNTER — Telehealth: Payer: Self-pay | Admitting: Internal Medicine

## 2023-04-14 ENCOUNTER — Other Ambulatory Visit: Payer: Self-pay | Admitting: Family Medicine

## 2023-04-14 ENCOUNTER — Encounter: Payer: Self-pay | Admitting: Internal Medicine

## 2023-04-14 DIAGNOSIS — Z1231 Encounter for screening mammogram for malignant neoplasm of breast: Secondary | ICD-10-CM

## 2023-04-14 NOTE — Telephone Encounter (Signed)
Pt c/o medication issue:  1. Name of Medication: Farxiga  2. How are you currently taking this medication (dosage and times per day)?   3. Are you having a reaction (difficulty breathing--STAT)? No  4. What is your medication issue? Pt stated she'd like a call back from nurse Mindi Junker to discuss her starting this medication again. Please advise

## 2023-04-15 NOTE — Telephone Encounter (Signed)
Spoke with pt who states she would like to restart [place] as she continues to have SOB.  Since stopping medication she does feel it may have been of some benefit with her SOB.  Pt denies any new or worsening symptoms.  Pt advised Dr Graciela Husbands is not in the office this week but will forward to his PA-C, Francis Dowse for further advisement.  Pt provided fax number to send printed Rx - [phone] if PA is agreeable.

## 2023-04-15 NOTE — Telephone Encounter (Signed)
Attempted phone call to pt.  No answer and no voicemail.

## 2023-04-17 ENCOUNTER — Encounter: Payer: Self-pay | Admitting: Internal Medicine

## 2023-04-21 MED ORDER — DAPAGLIFLOZIN PROPANEDIOL 10 MG PO TABS: 10.0000 mg | ORAL_TABLET | Freq: Every day | ORAL | 3 refills | Status: DC

## 2023-04-21 NOTE — Telephone Encounter (Signed)
Farxiga 10mg  - 1 tablet by mouth daily before breakfast has been faxed to number provided by pt.  This has been addressed in another  encounter.  See that encounter for complete details.

## 2023-04-21 NOTE — Telephone Encounter (Signed)
Dr Graciela Husbands agrees for pt to restart Farxiga 10mg  - 1 tablet by mouth daily before breakfast.  Rx has been faxed to number provided by patient and pt made aware via MyChart message.

## 2023-04-21 NOTE — Addendum Note (Signed)
Addended by: Alois Cliche on: 04/21/2023 01:57 PM   Modules accepted: Orders

## 2023-04-29 DIAGNOSIS — Z23 Encounter for immunization: Secondary | ICD-10-CM | POA: Diagnosis not present

## 2023-05-02 DIAGNOSIS — L814 Other melanin hyperpigmentation: Secondary | ICD-10-CM | POA: Diagnosis not present

## 2023-05-02 DIAGNOSIS — L570 Actinic keratosis: Secondary | ICD-10-CM | POA: Diagnosis not present

## 2023-05-02 DIAGNOSIS — D485 Neoplasm of uncertain behavior of skin: Secondary | ICD-10-CM | POA: Diagnosis not present

## 2023-05-02 DIAGNOSIS — L821 Other seborrheic keratosis: Secondary | ICD-10-CM | POA: Diagnosis not present

## 2023-05-02 DIAGNOSIS — D0359 Melanoma in situ of other part of trunk: Secondary | ICD-10-CM | POA: Diagnosis not present

## 2023-05-13 ENCOUNTER — Ambulatory Visit: Payer: Medicare Other | Attending: Internal Medicine | Admitting: Internal Medicine

## 2023-05-13 ENCOUNTER — Encounter: Payer: Self-pay | Admitting: Internal Medicine

## 2023-05-13 VITALS — BP 148/76 | HR 79 | Ht 68.0 in | Wt 222.2 lb

## 2023-05-13 DIAGNOSIS — I5032 Chronic diastolic (congestive) heart failure: Secondary | ICD-10-CM | POA: Diagnosis not present

## 2023-05-13 DIAGNOSIS — I495 Sick sinus syndrome: Secondary | ICD-10-CM | POA: Diagnosis not present

## 2023-05-13 DIAGNOSIS — Z950 Presence of cardiac pacemaker: Secondary | ICD-10-CM

## 2023-05-13 DIAGNOSIS — I4821 Permanent atrial fibrillation: Secondary | ICD-10-CM

## 2023-05-13 MED ORDER — DIGOXIN 125 MCG PO TABS: 0.1250 mg | ORAL_TABLET | Freq: Every day | ORAL | 3 refills | Status: DC

## 2023-05-13 MED ORDER — DILTIAZEM HCL ER COATED BEADS 120 MG PO CP24: 240.0000 mg | ORAL_CAPSULE | Freq: Two times a day (BID) | ORAL | Status: DC

## 2023-05-13 NOTE — Progress Notes (Signed)
Patient Care Team: Deatra James, MD as PCP - General (Family Medicine) Duke Salvia, MD as PCP - Electrophysiology (Cardiology) Duke Salvia, MD as PCP - Cardiology (Cardiology) Alfredo Martinez, MD as Consulting Physician (Urology) Duke Salvia, MD as Consulting Physician (Cardiology) Coralyn Helling, MD (Inactive) as Consulting Physician (Pulmonary Disease) Marcene Corning, MD as Consulting Physician (Orthopedic Surgery) Clide Deutscher, MD as Referring Physician (Ophthalmology) Charlsie Merles Kirstie Peri, DPM as Consulting Physician (Podiatry)   HPI  Brittany [name] is a 89 y.o. female Seen in followup for  pacer Medtronic implanted 12/12 for sinus node dysfunction.  She has atrial fibrillation for which she previously took sotalol try dofetilide which was a failure and then started amiodarone with anticipation of cardioversion..  This was done 10/23 and she failed to hold-- anticoagulation with apixaban ; device at RRT and replaced to/24  She will be undergoing a R knee arthroplasty 08/14/21. She had her L knee replaced 02/13/21. She endorses a history of stroke occurring 2 to 3 years ago and was put on Eliquis. During her previous surgery, she stopped the Eliquis 3 days precluding spinal  Of note, she also hope to replace her R hip because of pain and bone degeneration. Following her surgery, she developed what has been a relatively long-term issue with dyspnea which has been somewhat perplexing.  Seen by pulm  concerns re fluid overload and started on furosemide >> their evaluation was with clear lungs unlikely to be pulmonary   Atrial fibrillation is now permanent.  Rate control is with beta-blockers and calcium blockers.  Blood pressure has been variable, but reasonably well-controlled.  Dyspnea has become problematic again.  She has not been using her diuretics for the last few weeks.  She is edematous.     DATE TEST EF    8/12  Myoview  Normal LV No ischemia  6/19 Echo    55-60 %     1/23 Echo   55-60% BAE mod    Date Cr K Mg Hgb   5/17   1.9   1/18  0.59 4.5   14.7  11/18 0.55 4.4  15.8  3/20 0.61 4.4 2.1 12.9  9/20 0.63 4.9 2.0   8/21    13.0  10/21 0.69 4.7 1.9   7/22 0.64 3.9  14.1  8/22 0.49 4.5 2.0   1/23 0.66 4.1  8.8  3/23    11.9  8/23 0.73 4.7    9/24 0.66 4.1  13.9   Thromboembolic risk factors ( age  -2, HTN-1, Gender-1, Vascular disease +/- 1) for a CHADSVASc Score of >=4   Past Medical History:  Diagnosis Date   Ankle fracture 2016   Arthritis    Atrial fibrillation (HCC)    Depression    Dysrhythmia    Elevated parathyroid hormone    Fracture of orbital floor with routine healing    GERD (gastroesophageal reflux disease)    History of colon polyps    benign   Hypercalcemia    Hypertension    Hyponatremia    Hypothyroid    Macular degeneration    wet in the right and dry in the left   OSA on CPAP    Osteoporosis    Peripheral vascular disease (HCC)    Presence of permanent cardiac pacemaker    PSVT (paroxysmal supraventricular tachycardia) (HCC)    Rotator cuff tear    Sinus node dysfunction (HCC)    a. s/p MDT pacemaker  Stroke [facility])    Urinary incontinence    Wrist fracture 2018    Past Surgical History:  Procedure Laterality Date   BACK SURGERY     CARDIOVERSION N/A 05/02/2022   Procedure: CARDIOVERSION;  Surgeon: Chrystie Nose, MD;  Location: [facility] ENDOSCOPY;  Service: Cardiovascular;  Laterality: N/A;   CATARACT EXTRACTION     CATARACT EXTRACTION W/ INTRAOCULAR LENS  IMPLANT, BILATERAL Bilateral    COLONOSCOPY     ESOPHAGOGASTRODUODENOSCOPY     IR ANGIO INTRA EXTRACRAN SEL COM CAROTID INNOMINATE BILAT MOD SED  12/30/2017   IR ANGIO VERTEBRAL SEL VERTEBRAL BILAT MOD SED  12/30/2017   LAPAROSCOPIC CHOLECYSTECTOMY  08/11/1999   LUMBAR DISC SURGERY  02/19/2014   ORIF ANKLE FRACTURE Left 04/13/2015   Procedure: OPEN REDUCTION INTERNAL FIXATION (ORIF) LEFT ANKLE FRACTURE;  Surgeon: Cammy Copa,  MD;  Location: [facility] OR;  Service: Orthopedics;  Laterality: Left;   PACEMAKER PLACEMENT Right 07/22/2010   a. MDT dual chamber PPM implanted by Dr Graciela Husbands    [facility] GENERATOR CHANGEOUT N/A 08/22/2022   Procedure: PPM GENERATOR Janeann Merl;  Surgeon: Duke Salvia, MD;  Location[facility] INVASIVE CV LAB;  Service: Cardiovascular;  Laterality: N/A;   SHOULDER ARTHROSCOPY W/ ROTATOR CUFF REPAIR Right 08/30/2014   WITH MINI-OPEN ROTATOR CUFF REPAIR AND SUBACROMIAL DECOMPRESSION   SHOULDER ARTHROSCOPY WITH ROTATOR CUFF REPAIR AND SUBACROMIAL DECOMPRESSION Right 08/30/2014   Procedure: SHOULDER ARTHROSCOPY WITH MINI-OPEN ROTATOR CUFF REPAIR AND SUBACROMIAL DECOMPRESSION;  Surgeon: Cammy Copa, MD;  Location: [facility] OR;  Service: Orthopedics;  Laterality: Right;  RIGHT SHOULDER DIAGNOSTIC OPERATIVE ARTHROSCOPY, SUBACROMIAL DECOMPRESSION, MINI-OPEN ROTATOR CUFF REPAIR.   TOTAL HIP ARTHROPLASTY Right 08/14/2021   Procedure: TOTAL HIP ARTHROPLASTY ANTERIOR APPROACH;  Surgeon: Marcene Corning, MD;  Location: [facility] ORS;  Service: Orthopedics;  Laterality: Right;   TOTAL KNEE ARTHROPLASTY Left 02/13/2021   Procedure: LEFT TOTAL KNEE ARTHROPLASTY;  Surgeon: Marcene Corning, MD;  Location: [facility] ORS;  Service: Orthopedics;  Laterality: Left;   VAGINAL HYSTERECTOMY  07/22/1968   WRIST FRACTURE SURGERY Left     Current Outpatient Medications  Medication Sig Dispense Refill   acetaminophen (TYLENOL) 500 MG tablet Take 500 mg by mouth 2 (two) times daily.     alendronate (FOSAMAX) 70 MG tablet Take 1 tablet (70 mg total) by mouth every 7 (seven) days. Take with a full glass of water on an empty stomach. 4 tablet 11   apixaban (ELIQUIS) 5 MG TABS tablet Take 1 tablet (5 mg total) by mouth 2 (two) times daily. 180 tablet 3   atorvastatin (LIPITOR) 40 MG tablet Take 40 mg by mouth every evening.     B Complex-C (B-COMPLEX WITH VITAMIN C) tablet Take 1 tablet by mouth daily with lunch.      Cholecalciferol (VITAMIN D3) 125 MCG (5000  UT) TABS Take 5,000 Units by mouth daily with breakfast.     dapagliflozin propanediol (FARXIGA) 10 MG TABS tablet Take 1 tablet (10 mg total) by mouth daily before breakfast. 90 tablet 3   diltiazem (CARTIA XT) 120 MG 24 hr capsule TAKE 1 CAPSULE BY MOUTH EVERY MORNING AND 1 CAPSULE DAILY AT BEDTIME (Patient taking differently: Take 120 mg by mouth See admin instructions. TAKE 1 CAPSULE BY MOUTH EVERY MORNING AND 1 CAPSULE DAILY AT BEDTIME) 180 capsule 3   furosemide (LASIX) 40 MG tablet Take 80 mg by mouth every other day.     levothyroxine (SYNTHROID) 88 MCG tablet Take 88 mcg by mouth daily before breakfast.  lisinopril (ZESTRIL) 10 MG tablet Take 1 tablet (10 mg total) by mouth daily. 90 tablet 3   Lutein 20 MG TABS Take 20 mg by mouth daily with breakfast.     magnesium oxide (MAG-OX) 400 MG tablet Take 1 tablet (400 mg total) by mouth daily. 30 tablet 0   Melatonin 10 MG TABS Take 10-40 mg by mouth at bedtime as needed (sleep).     metoprolol succinate (TOPROL-XL) 50 MG 24 hr tablet TAKE TWO TABLETS BY MOUTH WITH OR FOLLOWING A MEAL THREE TIMES DAILY (Patient taking differently: Take 100 mg by mouth with breakfast, with lunch, and with evening meal. or following meals) 540 tablet 2   Multiple Vitamin (MULTIVITAMIN WITH MINERALS) TABS tablet Take 1 tablet by mouth daily with breakfast.      Multiple Vitamins-Minerals (PRESERVISION AREDS 2 PO) Take 1 capsule by mouth 2 (two) times daily with a meal.      POTASSIUM CHLORIDE ER PO Take 10 mEq by mouth daily.     sertraline (ZOLOFT) 50 MG tablet Take 1 tablet (50 mg total) by mouth daily. 30 tablet 2   No current facility-administered medications for this visit.    Allergies  Allergen Reactions   Citalopram Hydrobromide Other (See Comments)    Other reaction(s): QT prolongation with Tikosyn   Adhesive [Tape] Swelling and Rash   Bacitracin-Polymyxin B Other (See Comments)    Other reaction(s): Unknown   Benzalkonium Chloride Rash    Pt  was not aware of this allergy   Neosporin [Neomycin-Polymyxin-Gramicidin] Swelling and Rash    Review of Systems negative except from HPI and PMH  Physical Exam: BP (!) 148/76   Pulse 79   Ht 5\' 8"  (1.727 m)   Wt 222 lb 3.2 oz (100.8 kg)   LMP 07/22/1968   SpO2 94%   BMI 33.79 kg/m  Well developed and well nourished in no acute distress HENT normal Neck supple with JVP->10 Clear Device pocket well healed; without hematoma or erythema.  There is no tethering  ,Irregularly Irregular rate and rhythm with slow fast  controlled ventricular response 2/60murmur Abd-soft with active BS No Clubbing cyanosis 2+ R> L edema Skin-warm and dry A & Oriented  Grossly normal sensory and motor function  ECG atrial fibrillation with ventricular pacing at 79  Device function is  normal. Programming changes heart rate threshold was increased from medium low--medium high  see Paceart for details    ECG sinus at 85  intervals 14/09/41 Q waves V1 and V2 ECGs reviewed 2 and 3/23 there are small R waves in leads V1 and V2, suspect lead placement there  Assessment and  Plan Atrial fibrillation-permanent  HFpEF   Pacemaker-Medtronic    Hypertension  Patient is having worsening symptoms of congestive heart failure in the context of atrial fibrillation.  There are number of contributors.  The first is that her weight is up 15 pounds that she stopped taking her diuretics as she had company in town and did not want to be interrupted by the need to urinate.  Concurrent with this is an interesting divergence of data such that her device to clear is 85% ventricular pacing and today she is ventricular pacing about 5% suggesting that she is having more rapid rates overriding her pacemaker function and so tachycardia may also be contributing to her symptoms.  Her rate control has been very difficult.  She takes 300 mg a day of metoprolol already, we will discontinue her lisinopril and increase her  diltiazem from 120 twice daily--to 40 twice daily.  I would have a relatively low threshold for AV junction ablation  We will resume her furosemide at 80 mg daily x 5 days and then resume q. OD.  We will plan to have her come back in a few weeks and reassess heart rate control and heart failure status    I spent today reviewing her records including previous charts and labs, taking her history and face-to-face counseling,  adjusting medications, hall walking  and documentation

## 2023-05-13 NOTE — Patient Instructions (Signed)
Medication Instructions:  Your physician has recommended you make the following change in your medication:   ** Stop Lisinopril  ** Increase Diltiazem 120mg  to 2 tablets in the morning and 2 tablets in the evening.  ** Take your Furosemide 40mg  - 2 tablets (80mg ) by mouth x 5 days then begin taking 2 tablet by mouth every other day.  ** Start Digoxin 0.125mg  - 1 tablet by mouth daily   *If you need a refill on your cardiac medications before your next appointment, please call your pharmacy*   Lab Work: None ordered.  If you have labs (blood work) drawn today and your tests are completely normal, you will receive your results only by: MyChart Message (if you have MyChart) OR A paper copy in the mail If you have any lab test that is abnormal or we need to change your treatment, we will call you to review the results.   Testing/Procedures: None ordered.    Follow-Up: At [facility], you and your health needs are our priority.  As part of our continuing mission to provide you with exceptional heart care, we have created designated Provider Care Teams.  These Care Teams include your primary Cardiologist (physician) and Advanced Practice Providers (APPs -  Physician Assistants and Nurse Practitioners) who all work together to provide you with the care you need, when you need it.  We recommend signing up for the patient portal called "MyChart".  Sign up information is provided on this After Visit Summary.  MyChart is used to connect with patients for Virtual Visits (Telemedicine).  Patients are able to view lab/test results, encounter notes, upcoming appointments, etc.  Non-urgent messages can be sent to your provider as well.   To learn more about what you can do with MyChart, go to [email or web address].    Your next appointment:   3 weeks with Dr Odessa Fleming PA - will need Digoxin level that day

## 2023-05-21 ENCOUNTER — Telehealth (HOSPITAL_COMMUNITY): Payer: Self-pay

## 2023-05-21 ENCOUNTER — Other Ambulatory Visit (HOSPITAL_COMMUNITY): Payer: Self-pay | Admitting: Interventional Radiology

## 2023-05-21 DIAGNOSIS — I771 Stricture of artery: Secondary | ICD-10-CM

## 2023-05-21 NOTE — Telephone Encounter (Signed)
Called to schedule US carotid, no answer, left vm. AB

## 2023-05-22 ENCOUNTER — Ambulatory Visit
Admission: RE | Admit: 2023-05-22 | Discharge: 2023-05-22 | Disposition: A | Discharge status: Home / Self Care | Payer: Medicare Other | Source: Ambulatory Visit | Attending: Family Medicine | Admitting: Family Medicine

## 2023-05-22 DIAGNOSIS — Z1231 Encounter for screening mammogram for malignant neoplasm of breast: Secondary | ICD-10-CM | POA: Diagnosis not present

## 2023-05-24 LAB — CUP PACEART INCLINIC DEVICE CHECK
Date Time Interrogation Session: 20241022113427
Implantable Lead Connection Status: 753985
Implantable Lead Connection Status: 753985
Implantable Lead Implant Date: 20121213
Implantable Lead Implant Date: 20121213
Implantable Lead Location: 753859
Implantable Lead Location: 753860
Implantable Lead Model: 4076
Implantable Lead Model: 4076
Implantable Lead Serial Number: 835025
Implantable Lead Serial Number: 869269
Implantable Pulse Generator Implant Date: 20240201

## 2023-05-30 ENCOUNTER — Ambulatory Visit (HOSPITAL_COMMUNITY): Payer: Medicare Other

## 2023-06-02 DIAGNOSIS — D0359 Melanoma in situ of other part of trunk: Secondary | ICD-10-CM | POA: Diagnosis not present

## 2023-06-02 DIAGNOSIS — X32XXXD Exposure to sunlight, subsequent encounter: Secondary | ICD-10-CM | POA: Diagnosis not present

## 2023-06-02 DIAGNOSIS — L570 Actinic keratosis: Secondary | ICD-10-CM | POA: Diagnosis not present

## 2023-06-04 NOTE — Progress Notes (Deleted)
Cardiology Office Note Date:  06/04/2023  Patient ID:  Hanaa, Forester 19-Jul-1934, MRN [id number] PCP:  Deatra James, MD  Electrophysiologist: Dr. Graciela Husbands    Chief Complaint:   post ER  History of Present Illness: GENEICE SCHANK is a 89 y.o. female with history of stroke, SSSx w/PPM, Afib, ATach, HTN, HLD, hypothyroidism, OSA w/CPAP  07/30/21 Hgb 13.4 > HIP SURGERY  on 1/24 > 1/25 8.1, 9.0 > 08/22/21 8.8   She comes in today to be seen for Dr. Graciela Husbands, last seen by him 08/23/21, she had recently seen pulmonary with symptoms /signs of volume OL and started on furosemide. C/o palpitations, DOE as well as orthopedic pains He was unconvinced of HFpEF, though recommended to give the lasix a try for a week.  Also noted persistent HR 80's, suspect AT and her metoprolol increased to 150mg  BID, her ACE stopped Tikosyn continued  Saw pulm team 08/29/21, SOB better but not resolved, edema was resolved.  She was off lasix (at Dr. Odessa Fleming recommendation), suspected volume did play a roll though as well as deconditioning after orthopedic surgeries. Trial of prn albuterol   Pt called/my chart messages with concerns of lab results and ongoing SOB, started on torsemide 20mg  daily for 5 days and repeat BNP   BNP 3187 > 2448  I saw her 09/26/21 She is accompanied by her husband today. She reports that last year after her knee surgery she noticed a little SOB, but not much and tolerable, after her hip surgery though she has been markedly SOB with minimal exertion. The albuterol did not seem to help The diuretics perhaps though minimally. No CP, no palpitations or cardiac awareness No near syncope or syncope. She has had some longer episodes of Afib of late, she was unaware of them. Her DOE has been unchanged and persistent sione her hip surgery in January, she has not noticed specific days that she is SOB over others.  Suspected SOB multifactorial with a clear CXR, though elevated BNP. Increased AF  burden from her baseline felt triggered by recent surgery and anemia. Planned to check labs, if Hgb was better resume diuretic  Labs were OK, H/H better, BNP elevated and started on furosemide daily with plans for repeat labs  She saw Dr. Craige Cotta 11/06/21, not felt to have any obvious lung disease, and SOB likely CHF, anemia, deconditioning, recommended regular exercise  I saw her 12/21/21 She is accompanied by her husband She thinks she may be starting to feel a little less SOB when ambulating. Whe she is walking with her walker she can walk without SOB or difficulty, and can walk as far as she wants/needs to, but when walking under her own power she gets winded. No rest SOB No CP She denies symptoms of palpitations, but thinks she has had AFib, because when at her MD visit last week they told her she was out of rhythm, she could not tell. No near syncope or syncope. No bleeding. PMD did labs, 12/05/21 her PTH/calcium was elevated and is being referred to a endocrinologist, she recalls this being mentioned years ago by her MD in [place], there was some discussion about surgery but she did not want that. H/H 13/39 BUN/Creat 15/0.67 K+ 4.7  Unclear etiology of her SOB, is more persistent the her AFib is, planned for a stress test evaluate further. Stress test was abnormal with a small area of ischemia apical to mid inferior wall, and felt a low risk study given small  territory. I reviewed with general cardiologist, agreed, given small territory would not pursue cath but try nitrate 1st/manage medically.  Not certain that this defect would cause SOB she describes.  Pt subsequently called with new onset diarrhea with the start of Imdur and advised to stop it and hydrate, follow.   I called 02/12/22 and spoke to the pt, seems the diarrhea is once but every day and only at night, continues, is thin/watery.  No blood.   She had not reached out to PMD and was advised to.  Was planning on trying  immodium, she has tried pepto without relief. She was advised to reach out to her PMD    Saw her 02/13/22 She took the imodium last night and did not have loose stool, feels like her belly is better today. Discussed low likely hood of the Imdur being the etiology, but not impossible. She otherwise remains the same. No erst SOB No orthopnea/PND No SOB when walking with the walker Gets winded with walking under hew own power No near syncope or syncope. She is not convinced the furosemide helps No bleeding D/w Dr. Graciela Husbands She would like to avoid cath, plan for coronary CTa Consider Farxiga or jardiance if able once diarrhea is resolved, if CT does not explain her SOB  Unclear, looks like unable to pursue coronary CT 2/2 her Afib and elevated HRs at the time. Planned to revisit coronary CT if able to get her Afib better managed and/or rate controlled  Started on Farxiga  She saw Dr. Graciela Husbands 03/21/22, also despite increasing Afib burden, did not seem to align with or explain her day-in and day-out SOB. Tikosyn stopped > amiodarone Furosemide increased  Numerous pt encounters via calls/messages TSH became elevated and planned for her PMD to manage, with close f/u on amio +/- need for DCCV   06/02/22: DCCV had 3 shocks with brief periods of sinus only  Most recently patient messages about R eye issues, saw her eye MD that felt noyt associated with her macular degeneration but a vascular issue Multiple messages back/forth including Dr. Graciela Husbands and discussion with neurology. "visual migraine or local retinal pathology. TIAs unlikely give exactly same vascular distribution and very brief duration."  Advised to f/u with   Advised CPX testing to better understand her SOB, though exercise physiologist on maternity leave.  Pt reached out wondering if she may have amyloid....   I saw her 06/05/22,  She c/w the same DOE. No rest SOB, no nocturnal or supine SOB Exertional capacity remains  minimal under her own power, is able to walk further/longer if using a walker, grocery cart. No CP, no palpitations or cardiac awareness No near syncope or syncope. She will feel a little dizzy when standing up from bending over. No bleeding or signs of bleeding Planned for CPX tsting  CPX as below, obesity being primary/significant component for her SOB  Saw Dr. Graciela Husbands 08/21/22, more symptomatic with conversion to VVI 65 (though in AF).  With persistent AFIB, amiodarone stopped  Saw Dr. Graciela Husbands 11/29/22, SOB much improved, pt inquired about stopping Marcelline Deist, wanted to reduce lasix.  Farxiga contrinued given improved symptoms. Lasix to every other day Metoprolol 100mg  TID seemed to have been a communication problem, but doing well and continued  Admitted 12/01/22, syncope vs fall, suffered a small acute left parietal subdural hematoma and underlying moderate severity cerebral atrophy.  Neuro surgery involved, OAC not reversed, but held 2 weeks Reprted d/w cardiology, pacer functioning well, echo reassuring Discharged 12/03/22  Metoprolol dose unchanged   ER visit 02/04/23: fall/syncope, ambulating to the kitchen, reported recent URI symptoms, CT without evidence of intracranial bleed, chest x-ray without acute findings and no evidence of pneumonia. Labs unremarkable.  PPM with AFib, no other arrhythmias Discharged from the ER with instructions to see cardiology and neurology  TODAY She is accompanied by her husband. He reports she has fallen 5 times from May to July. May and July she was unconscious. The other 3 they are not certain of the mechanism, once for sure she got off balance bending forward picking up things and fell. These 3 for certain were not faints and she suspects all were off balance falls.  The 2 syncopal events were as she was ambulating.  Walking the the bathroom and to the kitchen. She can not recall any pre-or post symptoms  She has not yet stopped the farxiga but wants  to and wants to stop the lasix. She reports neither ever really helped her SOB and has cause incontinence.  She denies any clear/recurrent or notable orthostatic dizziness No CP DOE remains the same as it has been since it started. Maybe has some better days She has never the same since her hip surgery  No bleeding or signs of bleeding   Device information MDT dual chamber PPM implanted 07/04/11, gen change 08/22/22  AFib Hx Diagnosis goes as far back as 2-14 or earlier  AAD hx Sotalol started perhaps 2014 or earlier stopped with recurrent Afib Tikosyn started Sep 2020 > stopped with increasing AFib burden Amiodarone started Aug 2023 >> stopped Jan 2024, with failure to maintain SR Permanent AFib  Past Medical History:  Diagnosis Date   Ankle fracture 2016   Arthritis    Atrial fibrillation (HCC)    Depression    Dysrhythmia    Elevated parathyroid hormone    Fracture of orbital floor with routine healing    GERD (gastroesophageal reflux disease)    History of colon polyps    benign   Hypercalcemia    Hypertension    Hyponatremia    Hypothyroid    Macular degeneration    wet in the right and dry in the left   OSA on CPAP    Osteoporosis    Peripheral vascular disease (HCC)    Presence of permanent cardiac pacemaker    PSVT (paroxysmal supraventricular tachycardia) (HCC)    Rotator cuff tear    Sinus node dysfunction (HCC)    a. s/p MDT pacemaker   Stroke [facility])    Urinary incontinence    Wrist fracture 2018    Past Surgical History:  Procedure Laterality Date   BACK SURGERY     CARDIOVERSION N/A 05/02/2022   Procedure: CARDIOVERSION;  Surgeon: Chrystie Nose, MD;  Location: [facility] ENDOSCOPY;  Service: Cardiovascular;  Laterality: N/A;   CATARACT EXTRACTION     CATARACT EXTRACTION W/ INTRAOCULAR LENS  IMPLANT, BILATERAL Bilateral    COLONOSCOPY     ESOPHAGOGASTRODUODENOSCOPY     IR ANGIO INTRA EXTRACRAN SEL COM CAROTID INNOMINATE BILAT MOD SED  12/30/2017    IR ANGIO VERTEBRAL SEL VERTEBRAL BILAT MOD SED  12/30/2017   LAPAROSCOPIC CHOLECYSTECTOMY  08/11/1999   LUMBAR DISC SURGERY  02/19/2014   ORIF ANKLE FRACTURE Left 04/13/2015   Procedure: OPEN REDUCTION INTERNAL FIXATION (ORIF) LEFT ANKLE FRACTURE;  Surgeon: Cammy Copa, MD;  Location: [facility] OR;  Service: Orthopedics;  Laterality: Left;   PACEMAKER PLACEMENT Right 07/22/2010   a. MDT dual chamber PPM implanted  by Dr Graciela Husbands    [facility] GENERATOR CHANGEOUT N/A 08/22/2022   Procedure: PPM GENERATOR Janeann Merl;  Surgeon: Duke Salvia, MD;  Location: [facility] INVASIVE CV LAB;  Service: Cardiovascular;  Laterality: N/A;   SHOULDER ARTHROSCOPY W/ ROTATOR CUFF REPAIR Right 08/30/2014   WITH MINI-OPEN ROTATOR CUFF REPAIR AND SUBACROMIAL DECOMPRESSION   SHOULDER ARTHROSCOPY WITH ROTATOR CUFF REPAIR AND SUBACROMIAL DECOMPRESSION Right 08/30/2014   Procedure: SHOULDER ARTHROSCOPY WITH MINI-OPEN ROTATOR CUFF REPAIR AND SUBACROMIAL DECOMPRESSION;  Surgeon: Cammy Copa, MD;  Location: [facility] OR;  Service: Orthopedics;  Laterality: Right;  RIGHT SHOULDER DIAGNOSTIC OPERATIVE ARTHROSCOPY, SUBACROMIAL DECOMPRESSION, MINI-OPEN ROTATOR CUFF REPAIR.   TOTAL HIP ARTHROPLASTY Right 08/14/2021   Procedure: TOTAL HIP ARTHROPLASTY ANTERIOR APPROACH;  Surgeon: Marcene Corning, MD;  Location: [facility] ORS;  Service: Orthopedics;  Laterality: Right;   TOTAL KNEE ARTHROPLASTY Left 02/13/2021   Procedure: LEFT TOTAL KNEE ARTHROPLASTY;  Surgeon: Marcene Corning, MD;  Location: [facility] ORS;  Service: Orthopedics;  Laterality: Left;   VAGINAL HYSTERECTOMY  07/22/1968   WRIST FRACTURE SURGERY Left     Current Outpatient Medications  Medication Sig Dispense Refill   acetaminophen (TYLENOL) 500 MG tablet Take 500 mg by mouth 2 (two) times daily.     alendronate (FOSAMAX) 70 MG tablet Take 1 tablet (70 mg total) by mouth every 7 (seven) days. Take with a full glass of water on an empty stomach. 4 tablet 11   apixaban (ELIQUIS) 5 MG TABS tablet  Take 1 tablet (5 mg total) by mouth 2 (two) times daily. 180 tablet 3   atorvastatin (LIPITOR) 40 MG tablet Take 40 mg by mouth every evening.     B Complex-C (B-COMPLEX WITH VITAMIN C) tablet Take 1 tablet by mouth daily with lunch.      Cholecalciferol (VITAMIN D3) 125 MCG (5000 UT) TABS Take 5,000 Units by mouth daily with breakfast.     dapagliflozin propanediol (FARXIGA) 10 MG TABS tablet Take 1 tablet (10 mg total) by mouth daily before breakfast. 90 tablet 3   digoxin (LANOXIN) 0.125 MG tablet Take 1 tablet (0.125 mg total) by mouth daily. 90 tablet 3   diltiazem (CARTIA XT) 120 MG 24 hr capsule Take 2 capsules (240 mg total) by mouth 2 (two) times daily.     furosemide (LASIX) 40 MG tablet Take 80 mg by mouth every other day.     levothyroxine (SYNTHROID) 88 MCG tablet Take 88 mcg by mouth daily before breakfast.     Lutein 20 MG TABS Take 20 mg by mouth daily with breakfast.     magnesium oxide (MAG-OX) 400 MG tablet Take 1 tablet (400 mg total) by mouth daily. 30 tablet 0   Melatonin 10 MG TABS Take 10-40 mg by mouth at bedtime as needed (sleep).     metoprolol succinate (TOPROL-XL) 50 MG 24 hr tablet TAKE TWO TABLETS BY MOUTH WITH OR FOLLOWING A MEAL THREE TIMES DAILY (Patient taking differently: Take 100 mg by mouth with breakfast, with lunch, and with evening meal. or following meals) 540 tablet 2   Multiple Vitamin (MULTIVITAMIN WITH MINERALS) TABS tablet Take 1 tablet by mouth daily with breakfast.      Multiple Vitamins-Minerals (PRESERVISION AREDS 2 PO) Take 1 capsule by mouth 2 (two) times daily with a meal.      POTASSIUM CHLORIDE ER PO Take 10 mEq by mouth daily.     sertraline (ZOLOFT) 50 MG tablet Take 1 tablet (50 mg total) by mouth daily. 30 tablet 2  No current facility-administered medications for this visit.    Allergies:   Citalopram hydrobromide, Adhesive [tape], Bacitracin-polymyxin b, Benzalkonium chloride, and Neosporin [neomycin-polymyxin-gramicidin]   Social  History:  The patient  reports that she quit smoking about 32 years ago. Her smoking use included cigarettes. She started smoking about 77 years ago. She has a 45 pack-year smoking history. She has never used smokeless tobacco. She reports current alcohol use of about 28.0 standard drinks of alcohol per week. She reports that she does not use drugs.   Family History:  The patient's family history includes Asthma in her brother; Breast cancer in her paternal aunt; Cancer in her father; Colon cancer in her mother; Heart disease in her brother; Skin cancer in her brother; Stroke in her sister.  ROS:  Please see the history of present illness.    All other systems are reviewed and otherwise negative.   PHYSICAL EXAM:  VS:  LMP 07/22/1968  BMI: There is no height or weight on file to calculate BMI. Well nourished, well developed, in no acute distress HEENT: normocephalic, atraumatic Neck: no JVD, carotid bruits or masses Cardiac: RRR; no significant murmurs, no rubs, or gallops Lungs: CTA b/l, no wheezing, rhonchi or rales Abd: soft, nontender MS: no deformity or atrophy Ext: no edema Skin: warm and dry, no rash Neuro:  No gross deficits appreciated Psych: euthymic mood, full affect  PPM site is stable, no tethering or discomfort   EKG:   not done today  Device interrogation done today and reviewed by myself:  Battery and lead measurements are good No HVR episodes (including interrogate all)  12/02/22: TTE 1. Left ventricular ejection fraction, by estimation, is 60 to 65%. The  left ventricle has normal function. The left ventricle has no regional  wall motion abnormalities. There is mild left ventricular hypertrophy of  the basal-septal segment. Left  ventricular diastolic function could not be evaluated.   2. Right ventricular systolic function is normal. The right ventricular  size is normal. There is normal pulmonary artery systolic pressure.   3. Left atrial size was moderately  dilated.   4. Right atrial size was moderately dilated.   5. The mitral valve is normal in structure. Mild mitral valve  regurgitation. No evidence of mitral stenosis.   6. The aortic valve is tricuspid. Aortic valve regurgitation is mild.  Aortic valve sclerosis/calcification is present, without any evidence of  aortic stenosis.   7. The inferior vena cava is dilated in size with >50% respiratory  variability, suggesting right atrial pressure of 8 mmHg.    10/11/22: CPX  Attending: Normal functional capacity when compared to age-matched subjects. When the measured peak VO2 is adjusted for her ideal body weight there is significant improvement suggesting the patient's obesity is likely contributing significantly to her exercise intolerance. The VE/VCO2 is markedly elevated and can be related to elevated filling pressures during exercise; however given the low PETCO2 suspect may be more related to end-exercise hyperventilation. If symptoms persist, can consider exercise testing with invasive hemodynamic monitoring.    02/06/22: stress myoview   Findings are consistent with ischemia. The study is low risk.   No ST deviation was noted.   LV perfusion is abnormal. There is evidence of ischemia. Defect 1: There is a small defect with mild reduction in uptake present in the apical to mid inferior location(s) that is reversible. There is normal wall motion in the defect area. Consistent with ischemia.   Left ventricular function is normal. Nuclear  stress EF: 69 %. The left ventricular ejection fraction is hyperdynamic (>65%). End diastolic cavity size is normal. End systolic cavity size is normal.   Prior study available for comparison from 03/21/2021.   Reversible perfusion defect in apical to mid inferior wall consistent with ischemia Low risk study.  Area of ischemia is small   08/10/21: TTE 1. Left ventricular ejection fraction, by estimation, is 55 to 60%. The  left ventricle has normal  function. The left ventricle has no regional  wall motion abnormalities. There is mild asymmetric left ventricular  hypertrophy of the basal and septal  segments. Left ventricular diastolic parameters are consistent with Grade  I diastolic dysfunction (impaired relaxation). The average left  ventricular global longitudinal strain is -20.1 %. The global longitudinal  strain is normal.   2. Pacing wires in RA/RV . Right ventricular systolic function is normal.  The right ventricular size is normal. There is moderately elevated  pulmonary artery systolic pressure.   3. Left atrial size was moderately dilated.   4. Right atrial size was moderately dilated.   5. The mitral valve is degenerative. Mild to moderate mitral valve  regurgitation. No evidence of mitral stenosis.   6. Tricuspid valve regurgitation is mild to moderate.   7. The aortic valve is tricuspid. There is moderate calcification of the  aortic valve. There is moderate thickening of the aortic valve. Aortic  valve regurgitation is mild. Aortic valve sclerosis/calcification is  present, without any evidence of aortic  stenosis.   8. Aortic dilatation noted. There is mild dilatation of the ascending  aorta, measuring 38 mm.   9. The inferior vena cava is normal in size with greater than 50%  respiratory variability, suggesting right atrial pressure of 3 mmHg.   Comparison(s): 12/31/17 EF 55-60%.    Recent Labs: 12/02/2022: TSH 3.100 12/03/2022: Magnesium 2.1 02/04/2023: ALT 19; B Natriuretic Peptide 411.3; Hemoglobin 13.9; Platelets 306 04/03/2023: BUN 10; Creatinine, Ser 0.66; Potassium 4.1; Sodium 139  No results found for requested labs within last 365 days.   CrCl cannot be calculated (Patient's most recent lab result is older than the maximum 21 days allowed.).   Wt Readings from Last 3 Encounters:  05/13/23 222 lb 3.2 oz (100.8 kg)  03/25/23 218 lb 8 oz (99.1 kg)  03/17/23 214 lb (97.1 kg)     Other studies  reviewed: Additional studies/records reviewed today include: summarized above  ASSESSMENT AND PLAN:  PPM Intact function No programming changes made  Paroxysmal >> persistent >> permanent AFib ATach CHA2DS2Vasc is 6, on Eliquis, appropriately dosed  HTN Elevated Orthostatics negative today She denies orthostatic symptoms   SOB Likely multifactorial Unclear if her increased AF burden is the cause of her SOB, though don't think so. She can walk without or at least with less SOB using her walker, but gets SOB when walking un-aided perhaps 50 feet or so This is not escalating or changing, but not getting better No pulmonary process by her w/u with pulm Exam does not appear volume OL Stress test with small ischemic defect, low risk, small territory Intolerant of Imdur with diarrhea No CP CPX testing findings report obesity likely contributing significantly to her exercise intolerance   She wants to stop [place] and lasix Will stop the furosemide Discussed to monitor for any rebound swelling, or should she note worsened SOB to resume   6.  Syncope Uncertain mechanism ***   7. Secondary hypercoagulable state   Disposition: ***    Current medicines  are reviewed at length with the patient today.  The patient did not have any concerns regarding medicines.  Norma Fredrickson, PA-C 06/04/2023 5:44 PM     [facility] [address] [place] [address] [phone] (office)  [phone] (fax)

## 2023-06-05 ENCOUNTER — Ambulatory Visit: Payer: Medicare Other | Attending: Physician Assistant | Admitting: Physician Assistant

## 2023-06-05 DIAGNOSIS — H6123 Impacted cerumen, bilateral: Secondary | ICD-10-CM | POA: Diagnosis not present

## 2023-06-06 ENCOUNTER — Ambulatory Visit (HOSPITAL_COMMUNITY)
Admission: RE | Admit: 2023-06-06 | Discharge: 2023-06-06 | Disposition: A | Discharge status: Home / Self Care | Payer: Medicare Other | Source: Ambulatory Visit | Attending: Interventional Radiology | Admitting: Interventional Radiology

## 2023-06-06 DIAGNOSIS — I771 Stricture of artery: Secondary | ICD-10-CM | POA: Insufficient documentation

## 2023-06-07 DIAGNOSIS — M1711 Unilateral primary osteoarthritis, right knee: Secondary | ICD-10-CM | POA: Diagnosis not present

## 2023-06-09 ENCOUNTER — Telehealth (HOSPITAL_COMMUNITY): Payer: Self-pay

## 2023-06-09 NOTE — Telephone Encounter (Signed)
Called regarding recent imaging, no answer, left vm. AB

## 2023-06-09 NOTE — Telephone Encounter (Signed)
Pt agreed to f/u in 6 months with a us carotid. AB  

## 2023-06-12 ENCOUNTER — Ambulatory Visit (INDEPENDENT_AMBULATORY_CARE_PROVIDER_SITE_OTHER): Payer: Medicare Other

## 2023-06-12 DIAGNOSIS — I495 Sick sinus syndrome: Secondary | ICD-10-CM

## 2023-06-12 LAB — CUP PACEART REMOTE DEVICE CHECK
Battery Remaining Longevity: 151 mo
Battery Voltage: 3.08 V
Brady Statistic RA Percent Paced: 0 %
Brady Statistic RV Percent Paced: 93.93 %
Date Time Interrogation Session: 20241120220920
Implantable Lead Connection Status: 753985
Implantable Lead Connection Status: 753985
Implantable Lead Implant Date: 20121213
Implantable Lead Implant Date: 20121213
Implantable Lead Location: 753859
Implantable Lead Location: 753860
Implantable Lead Model: 4076
Implantable Lead Model: 4076
Implantable Lead Serial Number: 835025
Implantable Lead Serial Number: 869269
Implantable Pulse Generator Implant Date: 20240201
Lead Channel Impedance Value: 304 Ohm
Lead Channel Impedance Value: 361 Ohm
Lead Channel Impedance Value: 361 Ohm
Lead Channel Impedance Value: 475 Ohm
Lead Channel Pacing Threshold Amplitude: 1 V
Lead Channel Pacing Threshold Pulse Width: 0.4 ms
Lead Channel Sensing Intrinsic Amplitude: 0.375 mV
Lead Channel Sensing Intrinsic Amplitude: 0.375 mV
Lead Channel Sensing Intrinsic Amplitude: 11.75 mV
Lead Channel Sensing Intrinsic Amplitude: 11.75 mV
Lead Channel Setting Pacing Amplitude: 2 V
Lead Channel Setting Pacing Pulse Width: 0.4 ms
Lead Channel Setting Sensing Sensitivity: 0.9 mV
Zone Setting Status: 755011
Zone Setting Status: 755011

## 2023-06-18 ENCOUNTER — Encounter: Payer: Medicare Other | Admitting: Physician Assistant

## 2023-06-23 NOTE — Progress Notes (Unsigned)
Cardiology Office Note Date:  06/23/2023  Patient ID:  Priscella, Novelo 1934-05-20, MRN [id number] PCP:  Deatra James, MD  Electrophysiologist: Dr. Graciela Husbands    Chief Complaint:   *** 3 week  History of Present Illness: MARTHANN BOY is a 89 y.o. female with history of stroke, SSSx w/PPM, Afib, ATach, HTN, HLD, hypothyroidism, OSA w/CPAP  07/30/21 Hgb 13.4 > HIP SURGERY  on 1/24 > 1/25 8.1, 9.0 > 08/22/21 8.8   She comes in today to be seen for Dr. Graciela Husbands, last seen by him 08/23/21, she had recently seen pulmonary with symptoms /signs of volume OL and started on furosemide. C/o palpitations, DOE as well as orthopedic pains He was unconvinced of HFpEF, though recommended to give the lasix a try for a week.  Also noted persistent HR 80's, suspect AT and her metoprolol increased to 150mg  BID, her ACE stopped Tikosyn continued  Saw pulm team 08/29/21, SOB better but not resolved, edema was resolved.  She was off lasix (at Dr. Odessa Fleming recommendation), suspected volume did play a roll though as well as deconditioning after orthopedic surgeries. Trial of prn albuterol   Pt called/my chart messages with concerns of lab results and ongoing SOB, started on torsemide 20mg  daily for 5 days and repeat BNP   BNP 3187 > 2448  I saw her 09/26/21 She is accompanied by her husband today. She reports that last year after her knee surgery she noticed a little SOB, but not much and tolerable, after her hip surgery though she has been markedly SOB with minimal exertion. The albuterol did not seem to help The diuretics perhaps though minimally. No CP, no palpitations or cardiac awareness No near syncope or syncope. She has had some longer episodes of Afib of late, she was unaware of them. Her DOE has been unchanged and persistent sione her hip surgery in January, she has not noticed specific days that she is SOB over others.  Suspected SOB multifactorial with a clear CXR, though elevated BNP. Increased AF  burden from her baseline felt triggered by recent surgery and anemia. Planned to check labs, if Hgb was better resume diuretic  Labs were OK, H/H better, BNP elevated and started on furosemide daily with plans for repeat labs  She saw Dr. Craige Cotta 11/06/21, not felt to have any obvious lung disease, and SOB likely CHF, anemia, deconditioning, recommended regular exercise  I saw her 12/21/21 She is accompanied by her husband She thinks she may be starting to feel a little less SOB when ambulating. Whe she is walking with her walker she can walk without SOB or difficulty, and can walk as far as she wants/needs to, but when walking under her own power she gets winded. No rest SOB No CP She denies symptoms of palpitations, but thinks she has had AFib, because when at her MD visit last week they told her she was out of rhythm, she could not tell. No near syncope or syncope. No bleeding. PMD did labs, 12/05/21 her PTH/calcium was elevated and is being referred to a endocrinologist, she recalls this being mentioned years ago by her MD in [place], there was some discussion about surgery but she did not want that. H/H 13/39 BUN/Creat 15/0.67 K+ 4.7  Unclear etiology of her SOB, is more persistent the her AFib is, planned for a stress test evaluate further. Stress test was abnormal with a small area of ischemia apical to mid inferior wall, and felt a low risk study given  small territory. I reviewed with general cardiologist, agreed, given small territory would not pursue cath but try nitrate 1st/manage medically.  Not certain that this defect would cause SOB she describes.  Pt subsequently called with new onset diarrhea with the start of Imdur and advised to stop it and hydrate, follow.   I called 02/12/22 and spoke to the pt, seems the diarrhea is once but every day and only at night, continues, is thin/watery.  No blood.   She had not reached out to PMD and was advised to.  Was planning on trying  immodium, she has tried pepto without relief. She was advised to reach out to her PMD    Saw her 02/13/22 She took the imodium last night and did not have loose stool, feels like her belly is better today. Discussed low likely hood of the Imdur being the etiology, but not impossible. She otherwise remains the same. No erst SOB No orthopnea/PND No SOB when walking with the walker Gets winded with walking under hew own power No near syncope or syncope. She is not convinced the furosemide helps No bleeding D/w Dr. Graciela Husbands She would like to avoid cath, plan for coronary CTa Consider Farxiga or jardiance if able once diarrhea is resolved, if CT does not explain her SOB  Unclear, looks like unable to pursue coronary CT 2/2 her Afib and elevated HRs at the time. Planned to revisit coronary CT if able to get her Afib better managed and/or rate controlled  Started on Farxiga  She saw Dr. Graciela Husbands 03/21/22, also despite increasing Afib burden, did not seem to align with or explain her day-in and day-out SOB. Tikosyn stopped > amiodarone Furosemide increased  Numerous pt encounters via calls/messages TSH became elevated and planned for her PMD to manage, with close f/u on amio +/- need for DCCV   06/02/22: DCCV had 3 shocks with brief periods of sinus only  Most recently patient messages about R eye issues, saw her eye MD that felt noyt associated with her macular degeneration but a vascular issue Multiple messages back/forth including Dr. Graciela Husbands and discussion with neurology. "visual migraine or local retinal pathology. TIAs unlikely give exactly same vascular distribution and very brief duration."  Advised to f/u with   Advised CPX testing to better understand her SOB, though exercise physiologist on maternity leave.  Pt reached out wondering if she may have amyloid....   I saw her 06/05/22,  She c/w the same DOE. No rest SOB, no nocturnal or supine SOB Exertional capacity remains  minimal under her own power, is able to walk further/longer if using a walker, grocery cart. No CP, no palpitations or cardiac awareness No near syncope or syncope. She will feel a little dizzy when standing up from bending over. No bleeding or signs of bleeding Planned for CPX tsting  CPX as below, obesity being primary/significant component for her SOB  Saw Dr. Graciela Husbands 08/21/22, more symptomatic with conversion to VVI 65 (though in AF).  With persistent AFIB, amiodarone stopped  Saw Dr. Graciela Husbands 11/29/22, SOB much improved, pt inquired about stopping Marcelline Deist, wanted to reduce lasix.  Farxiga contrinued given improved symptoms. Lasix to every other day Metoprolol 100mg  TID seemed to have been a communication problem, but doing well and continued  Admitted 12/01/22, syncope vs fall, suffered a small acute left parietal subdural hematoma and underlying moderate severity cerebral atrophy.  Neuro surgery involved, OAC not reversed, but held 2 weeks Reprted d/w cardiology, pacer functioning well, echo reassuring Discharged  12/03/22 Metoprolol dose unchanged   ER visit 02/04/23: fall/syncope, ambulating to the kitchen, reported recent URI symptoms, CT without evidence of intracranial bleed, chest x-ray without acute findings and no evidence of pneumonia. Labs unremarkable.  PPM with AFib, no other arrhythmias Discharged from the ER with instructions to see cardiology and neurology  I saw her 03/17/23,  She is accompanied by her husband. He reports she has fallen 5 times from May to July. May and July she was unconscious. The other 3 they are not certain of the mechanism, once for sure she got off balance bending forward picking up things and fell. These 3 for certain were not faints and she suspects all were off balance falls. The 2 syncopal events were as she was ambulating.  Walking the the bathroom and to the kitchen. She can not recall any pre-or post symptoms She has not yet stopped the farxiga  but wants to and wants to stop the lasix. She reports neither ever really helped her SOB and has cause incontinence. She denies any clear/recurrent or notable orthostatic dizziness No CP DOE remains the same as it has been since it started. Maybe has some better days She has never the same since her hip surgery No bleeding or signs of bleeding She wanted off the lasix and farxiga finding no benefit Unclear mechanism of her syncope  Pt called requesting to be placed back in farxiga in hinmd sight perhaps was feeling better on it  Saw Dr. Graciela Husbands 05/13/23, volume OL Noted histogram HRs shifted right Diltiazem increased, dig started Furosemide resumed Low threshold for AV node ablation Planned for early follow up  *** volume, weight *** labs, dig level *** symptoms *** HRs *** conducting rates vs RV pacing ??  Which is worse?   Device information MDT dual chamber PPM implanted 07/04/11, gen change 08/22/22  AFib Hx Diagnosis goes as far back as 2-14 or earlier  AAD hx Sotalol started perhaps 2014 or earlier stopped with recurrent Afib Tikosyn started Sep 2020 > stopped with increasing AFib burden Amiodarone started Aug 2023 >> stopped Jan 2024, with failure to maintain SR Permanent AFib  Past Medical History:  Diagnosis Date   Ankle fracture 2016   Arthritis    Atrial fibrillation (HCC)    Depression    Dysrhythmia    Elevated parathyroid hormone    Fracture of orbital floor with routine healing    GERD (gastroesophageal reflux disease)    History of colon polyps    benign   Hypercalcemia    Hypertension    Hyponatremia    Hypothyroid    Macular degeneration    wet in the right and dry in the left   OSA on CPAP    Osteoporosis    Peripheral vascular disease (HCC)    Presence of permanent cardiac pacemaker    PSVT (paroxysmal supraventricular tachycardia) (HCC)    Rotator cuff tear    Sinus node dysfunction (HCC)    a. s/p MDT pacemaker   Stroke [facility])     Urinary incontinence    Wrist fracture 2018    Past Surgical History:  Procedure Laterality Date   BACK SURGERY     CARDIOVERSION N/A 05/02/2022   Procedure: CARDIOVERSION;  Surgeon: Chrystie Nose, MD;  Location: [facility] ENDOSCOPY;  Service: Cardiovascular;  Laterality: N/A;   CATARACT EXTRACTION     CATARACT EXTRACTION W/ INTRAOCULAR LENS  IMPLANT, BILATERAL Bilateral    COLONOSCOPY     ESOPHAGOGASTRODUODENOSCOPY  IR ANGIO INTRA EXTRACRAN SEL COM CAROTID INNOMINATE BILAT MOD SED  12/30/2017   IR ANGIO VERTEBRAL SEL VERTEBRAL BILAT MOD SED  12/30/2017   LAPAROSCOPIC CHOLECYSTECTOMY  08/11/1999   LUMBAR DISC SURGERY  02/19/2014   ORIF ANKLE FRACTURE Left 04/13/2015   Procedure: OPEN REDUCTION INTERNAL FIXATION (ORIF) LEFT ANKLE FRACTURE;  Surgeon: Cammy Copa, MD;  Location: [facility] OR;  Service: Orthopedics;  Laterality: Left;   PACEMAKER PLACEMENT Right 07/22/2010   a. MDT dual chamber PPM implanted by Dr Graciela Husbands    [facility] GENERATOR CHANGEOUT N/A 08/22/2022   Procedure: PPM GENERATOR Janeann Merl;  Surgeon: Duke Salvia, MD;  Location: [facility] CV LAB;  Service: Cardiovascular;  Laterality: N/A;   SHOULDER ARTHROSCOPY W/ ROTATOR CUFF REPAIR Right 08/30/2014   WITH MINI-OPEN ROTATOR CUFF REPAIR AND SUBACROMIAL DECOMPRESSION   SHOULDER ARTHROSCOPY WITH ROTATOR CUFF REPAIR AND SUBACROMIAL DECOMPRESSION Right 08/30/2014   Procedure: SHOULDER ARTHROSCOPY WITH MINI-OPEN ROTATOR CUFF REPAIR AND SUBACROMIAL DECOMPRESSION;  Surgeon: Cammy Copa, MD;  Location: [facility] OR;  Service: Orthopedics;  Laterality: Right;  RIGHT SHOULDER DIAGNOSTIC OPERATIVE ARTHROSCOPY, SUBACROMIAL DECOMPRESSION, MINI-OPEN ROTATOR CUFF REPAIR.   TOTAL HIP ARTHROPLASTY Right 08/14/2021   Procedure: TOTAL HIP ARTHROPLASTY ANTERIOR APPROACH;  Surgeon: Marcene Corning, MD;  Location: [facility] ORS;  Service: Orthopedics;  Laterality: Right;   TOTAL KNEE ARTHROPLASTY Left 02/13/2021   Procedure: LEFT TOTAL KNEE ARTHROPLASTY;   Surgeon: Marcene Corning, MD;  Location: [facility] ORS;  Service: Orthopedics;  Laterality: Left;   VAGINAL HYSTERECTOMY  07/22/1968   WRIST FRACTURE SURGERY Left     Current Outpatient Medications  Medication Sig Dispense Refill   acetaminophen (TYLENOL) 500 MG tablet Take 500 mg by mouth 2 (two) times daily.     alendronate (FOSAMAX) 70 MG tablet Take 1 tablet (70 mg total) by mouth every 7 (seven) days. Take with a full glass of water on an empty stomach. 4 tablet 11   apixaban (ELIQUIS) 5 MG TABS tablet Take 1 tablet (5 mg total) by mouth 2 (two) times daily. 180 tablet 3   atorvastatin (LIPITOR) 40 MG tablet Take 40 mg by mouth every evening.     B Complex-C (B-COMPLEX WITH VITAMIN C) tablet Take 1 tablet by mouth daily with lunch.      Cholecalciferol (VITAMIN D3) 125 MCG (5000 UT) TABS Take 5,000 Units by mouth daily with breakfast.     dapagliflozin propanediol (FARXIGA) 10 MG TABS tablet Take 1 tablet (10 mg total) by mouth daily before breakfast. 90 tablet 3   digoxin (LANOXIN) 0.125 MG tablet Take 1 tablet (0.125 mg total) by mouth daily. 90 tablet 3   diltiazem (CARTIA XT) 120 MG 24 hr capsule Take 2 capsules (240 mg total) by mouth 2 (two) times daily.     furosemide (LASIX) 40 MG tablet Take 80 mg by mouth every other day.     levothyroxine (SYNTHROID) 88 MCG tablet Take 88 mcg by mouth daily before breakfast.     Lutein 20 MG TABS Take 20 mg by mouth daily with breakfast.     magnesium oxide (MAG-OX) 400 MG tablet Take 1 tablet (400 mg total) by mouth daily. 30 tablet 0   Melatonin 10 MG TABS Take 10-40 mg by mouth at bedtime as needed (sleep).     metoprolol succinate (TOPROL-XL) 50 MG 24 hr tablet TAKE TWO TABLETS BY MOUTH WITH OR FOLLOWING A MEAL THREE TIMES DAILY (Patient taking differently: Take 100 mg by mouth with breakfast, with lunch, and with  evening meal. or following meals) 540 tablet 2   Multiple Vitamin (MULTIVITAMIN WITH MINERALS) TABS tablet Take 1 tablet by mouth daily  with breakfast.      Multiple Vitamins-Minerals (PRESERVISION AREDS 2 PO) Take 1 capsule by mouth 2 (two) times daily with a meal.      POTASSIUM CHLORIDE ER PO Take 10 mEq by mouth daily.     sertraline (ZOLOFT) 50 MG tablet Take 1 tablet (50 mg total) by mouth daily. 30 tablet 2   No current facility-administered medications for this visit.    Allergies:   Citalopram hydrobromide, Adhesive [tape], Bacitracin-polymyxin b, Benzalkonium chloride, and Neosporin [neomycin-polymyxin-gramicidin]   Social History:  The patient  reports that she quit smoking about 32 years ago. Her smoking use included cigarettes. She started smoking about 77 years ago. She has a 45 pack-year smoking history. She has never used smokeless tobacco. She reports current alcohol use of about 28.0 standard drinks of alcohol per week. She reports that she does not use drugs.   Family History:  The patient's family history includes Asthma in her brother; Breast cancer in her paternal aunt; Cancer in her father; Colon cancer in her mother; Heart disease in her brother; Skin cancer in her brother; Stroke in her sister.  ROS:  Please see the history of present illness.    All other systems are reviewed and otherwise negative.   PHYSICAL EXAM:  VS:  LMP 07/22/1968  BMI: There is no height or weight on file to calculate BMI. Well nourished, well developed, in no acute distress HEENT: normocephalic, atraumatic Neck: no JVD, carotid bruits or masses Cardiac: *** RRR *** (paced); no significant murmurs, no rubs, or gallops Lungs: *** CTA b/l, no wheezing, rhonchi or rales Abd: soft, nontender MS: no deformity or atrophy Ext: *** no edema Skin: warm and dry, no rash Neuro:  No gross deficits appreciated Psych: euthymic mood, full affect  *** PPM site is stable, no tethering or discomfort   EKG:   not done today  Device interrogation done today and reviewed by myself:  Battery and lead measurements are good No HVR  episodes (including interrogate all)  12/02/22: TTE 1. Left ventricular ejection fraction, by estimation, is 60 to 65%. The  left ventricle has normal function. The left ventricle has no regional  wall motion abnormalities. There is mild left ventricular hypertrophy of  the basal-septal segment. Left  ventricular diastolic function could not be evaluated.   2. Right ventricular systolic function is normal. The right ventricular  size is normal. There is normal pulmonary artery systolic pressure.   3. Left atrial size was moderately dilated.   4. Right atrial size was moderately dilated.   5. The mitral valve is normal in structure. Mild mitral valve  regurgitation. No evidence of mitral stenosis.   6. The aortic valve is tricuspid. Aortic valve regurgitation is mild.  Aortic valve sclerosis/calcification is present, without any evidence of  aortic stenosis.   7. The inferior vena cava is dilated in size with >50% respiratory  variability, suggesting right atrial pressure of 8 mmHg.    10/11/22: CPX  Attending: Normal functional capacity when compared to age-matched subjects. When the measured peak VO2 is adjusted for her ideal body weight there is significant improvement suggesting the patient's obesity is likely contributing significantly to her exercise intolerance. The VE/VCO2 is markedly elevated and can be related to elevated filling pressures during exercise; however given the low PETCO2 suspect may be more related to  end-exercise hyperventilation. If symptoms persist, can consider exercise testing with invasive hemodynamic monitoring.    02/06/22: stress myoview   Findings are consistent with ischemia. The study is low risk.   No ST deviation was noted.   LV perfusion is abnormal. There is evidence of ischemia. Defect 1: There is a small defect with mild reduction in uptake present in the apical to mid inferior location(s) that is reversible. There is normal wall motion in the defect  area. Consistent with ischemia.   Left ventricular function is normal. Nuclear stress EF: 69 %. The left ventricular ejection fraction is hyperdynamic (>65%). End diastolic cavity size is normal. End systolic cavity size is normal.   Prior study available for comparison from 03/21/2021.   Reversible perfusion defect in apical to mid inferior wall consistent with ischemia Low risk study.  Area of ischemia is small   08/10/21: TTE 1. Left ventricular ejection fraction, by estimation, is 55 to 60%. The  left ventricle has normal function. The left ventricle has no regional  wall motion abnormalities. There is mild asymmetric left ventricular  hypertrophy of the basal and septal  segments. Left ventricular diastolic parameters are consistent with Grade  I diastolic dysfunction (impaired relaxation). The average left  ventricular global longitudinal strain is -20.1 %. The global longitudinal  strain is normal.   2. Pacing wires in RA/RV . Right ventricular systolic function is normal.  The right ventricular size is normal. There is moderately elevated  pulmonary artery systolic pressure.   3. Left atrial size was moderately dilated.   4. Right atrial size was moderately dilated.   5. The mitral valve is degenerative. Mild to moderate mitral valve  regurgitation. No evidence of mitral stenosis.   6. Tricuspid valve regurgitation is mild to moderate.   7. The aortic valve is tricuspid. There is moderate calcification of the  aortic valve. There is moderate thickening of the aortic valve. Aortic  valve regurgitation is mild. Aortic valve sclerosis/calcification is  present, without any evidence of aortic  stenosis.   8. Aortic dilatation noted. There is mild dilatation of the ascending  aorta, measuring 38 mm.   9. The inferior vena cava is normal in size with greater than 50%  respiratory variability, suggesting right atrial pressure of 3 mmHg.   Comparison(s): 12/31/17 EF 55-60%.     Recent Labs: 12/02/2022: TSH 3.100 12/03/2022: Magnesium 2.1 02/04/2023: ALT 19; B Natriuretic Peptide 411.3; Hemoglobin 13.9; Platelets 306 04/03/2023: BUN 10; Creatinine, Ser 0.66; Potassium 4.1; Sodium 139  No results found for requested labs within last 365 days.   CrCl cannot be calculated (Patient's most recent lab result is older than the maximum 21 days allowed.).   Wt Readings from Last 3 Encounters:  05/13/23 222 lb 3.2 oz (100.8 kg)  03/25/23 218 lb 8 oz (99.1 kg)  03/17/23 214 lb (97.1 kg)     Other studies reviewed: Additional studies/records reviewed today include: summarized above  ASSESSMENT AND PLAN:  PPM *** Intact function *** No programming changes made  Paroxysmal >> persistent >> permanent AFib ATach CHA2DS2Vasc is 6, on Eliquis, *** appropriately dosed  HTN ***    SOB ***   6.  Syncope Uncertain mechanism ***   7. Secondary hypercoagulable state   Disposition: ***    Current medicines are reviewed at length with the patient today.  The patient did not have any concerns regarding medicines.  Norma Fredrickson, PA-C 06/23/2023 9:07 AM     [facility] [address] [place] [address] [phone] (office)  [phone] (fax)

## 2023-06-26 ENCOUNTER — Ambulatory Visit: Payer: Medicare Other | Attending: Physician Assistant | Admitting: Physician Assistant

## 2023-06-26 ENCOUNTER — Encounter: Payer: Self-pay | Admitting: Physician Assistant

## 2023-06-26 ENCOUNTER — Other Ambulatory Visit: Payer: Self-pay | Admitting: Physician Assistant

## 2023-06-26 VITALS — BP 130/96 | HR 68 | Ht 68.0 in | Wt 216.6 lb

## 2023-06-26 DIAGNOSIS — D6869 Other thrombophilia: Secondary | ICD-10-CM | POA: Insufficient documentation

## 2023-06-26 DIAGNOSIS — Z950 Presence of cardiac pacemaker: Secondary | ICD-10-CM | POA: Insufficient documentation

## 2023-06-26 DIAGNOSIS — I5032 Chronic diastolic (congestive) heart failure: Secondary | ICD-10-CM | POA: Insufficient documentation

## 2023-06-26 DIAGNOSIS — I4821 Permanent atrial fibrillation: Secondary | ICD-10-CM | POA: Insufficient documentation

## 2023-06-26 DIAGNOSIS — I10 Essential (primary) hypertension: Secondary | ICD-10-CM | POA: Diagnosis not present

## 2023-06-26 DIAGNOSIS — Z79899 Other long term (current) drug therapy: Secondary | ICD-10-CM | POA: Insufficient documentation

## 2023-06-26 LAB — CUP PACEART INCLINIC DEVICE CHECK
Battery Remaining Longevity: 151 mo
Battery Voltage: 3.07 V
Brady Statistic RA Percent Paced: 0 %
Brady Statistic RV Percent Paced: 93.82 %
Date Time Interrogation Session: 20241205144058
Implantable Lead Connection Status: 753985
Implantable Lead Connection Status: 753985
Implantable Lead Implant Date: 20121213
Implantable Lead Implant Date: 20121213
Implantable Lead Location: 753859
Implantable Lead Location: 753860
Implantable Lead Model: 4076
Implantable Lead Model: 4076
Implantable Lead Serial Number: 835025
Implantable Lead Serial Number: 869269
Implantable Pulse Generator Implant Date: 20240201
Lead Channel Impedance Value: 304 Ohm
Lead Channel Impedance Value: 361 Ohm
Lead Channel Impedance Value: 399 Ohm
Lead Channel Impedance Value: 494 Ohm
Lead Channel Pacing Threshold Amplitude: 1 V
Lead Channel Pacing Threshold Pulse Width: 0.4 ms
Lead Channel Sensing Intrinsic Amplitude: 0.375 mV
Lead Channel Sensing Intrinsic Amplitude: 0.625 mV
Lead Channel Sensing Intrinsic Amplitude: 11.375 mV
Lead Channel Sensing Intrinsic Amplitude: 12.125 mV
Lead Channel Setting Pacing Amplitude: 2 V
Lead Channel Setting Pacing Pulse Width: 0.4 ms
Lead Channel Setting Sensing Sensitivity: 0.9 mV
Zone Setting Status: 755011
Zone Setting Status: 755011

## 2023-06-26 MED ORDER — FUROSEMIDE 40 MG PO TABS: 80.0000 mg | ORAL_TABLET | ORAL | 2 refills | Status: DC

## 2023-06-26 NOTE — Patient Instructions (Addendum)
Medication Instructions:    START TAKING:  LASIX 80 MG ALTERNATING 40 MG EVERY OTHER DAY   START TAKING:  KDUR 10 MEQ  EVERY DAY   *If you need a refill on your cardiac medications before your next appointment, please call your pharmacy*   Lab Work:  BMET AND DIG LEVEL TODAY   If you have labs (blood work) drawn today and your tests are completely normal, you will receive your results only by: MyChart Message (if you have MyChart) OR A paper copy in the mail If you have any lab test that is abnormal or we need to change your treatment, we will call you to review the results.   Testing/Procedures: NONE ORDERED  TODAY    Follow-Up: At [facility], you and your health needs are our priority.  As part of our continuing mission to provide you with exceptional heart care, we have created designated Provider Care Teams.  These Care Teams include your primary Cardiologist (physician) and Advanced Practice Providers (APPs -  Physician Assistants and Nurse Practitioners) who all work together to provide you with the care you need, when you need it.  We recommend signing up for the patient portal called "MyChart".  Sign up information is provided on this After Visit Summary.  MyChart is used to connect with patients for Virtual Visits (Telemedicine).  Patients are able to view lab/test results, encounter notes, upcoming appointments, etc.  Non-urgent messages can be sent to your provider as well.   To learn more about what you can do with MyChart, go to [email or web address].    Your next appointment:  You have been referred to HEART FAILURE CLINIC NEXT ASAP APPOINTMENT   PLEASE MAKE SURE YOU MAKE AN APPOINTMENT WITH YOUR PULMONOLOGIST FOR SHORTNESS OF BREATH.       Other Instructions

## 2023-06-27 DIAGNOSIS — I4821 Permanent atrial fibrillation: Secondary | ICD-10-CM | POA: Diagnosis not present

## 2023-06-27 DIAGNOSIS — D6869 Other thrombophilia: Secondary | ICD-10-CM | POA: Diagnosis not present

## 2023-06-27 DIAGNOSIS — E785 Hyperlipidemia, unspecified: Secondary | ICD-10-CM | POA: Diagnosis not present

## 2023-06-27 DIAGNOSIS — F411 Generalized anxiety disorder: Secondary | ICD-10-CM | POA: Diagnosis not present

## 2023-06-27 DIAGNOSIS — E039 Hypothyroidism, unspecified: Secondary | ICD-10-CM | POA: Diagnosis not present

## 2023-06-27 LAB — BASIC METABOLIC PANEL
BUN/Creatinine Ratio: 14 (ref 12–28)
BUN: 10 mg/dL (ref 8–27)
CO2: 24 mmol/L (ref 20–29)
Calcium: 10.5 mg/dL — ABNORMAL HIGH (ref 8.7–10.3)
Chloride: 99 mmol/L (ref 96–106)
Creatinine, Ser: 0.73 mg/dL (ref 0.57–1.00)
Glucose: 122 mg/dL — ABNORMAL HIGH (ref 70–99)
Potassium: 4.7 mmol/L (ref 3.5–5.2)
Sodium: 140 mmol/L (ref 134–144)
eGFR: 80 mL/min/{1.73_m2} (ref >59)

## 2023-06-27 LAB — DIGOXIN LEVEL: Digoxin, Serum: 0.6 ng/mL (ref 0.5–0.9)

## 2023-06-30 DIAGNOSIS — Z08 Encounter for follow-up examination after completed treatment for malignant neoplasm: Secondary | ICD-10-CM | POA: Diagnosis not present

## 2023-06-30 DIAGNOSIS — Z85820 Personal history of malignant melanoma of skin: Secondary | ICD-10-CM | POA: Diagnosis not present

## 2023-06-30 DIAGNOSIS — D225 Melanocytic nevi of trunk: Secondary | ICD-10-CM | POA: Diagnosis not present

## 2023-06-30 DIAGNOSIS — Z1283 Encounter for screening for malignant neoplasm of skin: Secondary | ICD-10-CM | POA: Diagnosis not present

## 2023-07-07 NOTE — Progress Notes (Signed)
Remote pacemaker transmission.   

## 2023-07-09 DIAGNOSIS — M546 Pain in thoracic spine: Secondary | ICD-10-CM | POA: Diagnosis not present

## 2023-07-09 DIAGNOSIS — M9902 Segmental and somatic dysfunction of thoracic region: Secondary | ICD-10-CM | POA: Diagnosis not present

## 2023-07-09 DIAGNOSIS — M9950 Intervertebral disc stenosis of neural canal of head region: Secondary | ICD-10-CM | POA: Diagnosis not present

## 2023-07-09 DIAGNOSIS — M9903 Segmental and somatic dysfunction of lumbar region: Secondary | ICD-10-CM | POA: Diagnosis not present

## 2023-07-18 ENCOUNTER — Ambulatory Visit (HOSPITAL_COMMUNITY)
Admission: RE | Admit: 2023-07-18 | Discharge: 2023-07-18 | Disposition: A | Discharge status: Home / Self Care | Payer: Medicare Other | Source: Ambulatory Visit | Attending: Cardiology | Admitting: Cardiology

## 2023-07-18 ENCOUNTER — Encounter (HOSPITAL_COMMUNITY): Payer: Self-pay | Admitting: Cardiology

## 2023-07-18 ENCOUNTER — Other Ambulatory Visit (HOSPITAL_COMMUNITY): Payer: Self-pay | Admitting: *Deleted

## 2023-07-18 VITALS — BP 158/80 | HR 61 | Ht 68.0 in | Wt 213.8 lb

## 2023-07-18 DIAGNOSIS — Z7901 Long term (current) use of anticoagulants: Secondary | ICD-10-CM | POA: Diagnosis not present

## 2023-07-18 DIAGNOSIS — I5032 Chronic diastolic (congestive) heart failure: Secondary | ICD-10-CM

## 2023-07-18 DIAGNOSIS — Z950 Presence of cardiac pacemaker: Secondary | ICD-10-CM | POA: Insufficient documentation

## 2023-07-18 DIAGNOSIS — I495 Sick sinus syndrome: Secondary | ICD-10-CM | POA: Insufficient documentation

## 2023-07-18 DIAGNOSIS — Z79899 Other long term (current) drug therapy: Secondary | ICD-10-CM | POA: Insufficient documentation

## 2023-07-18 DIAGNOSIS — I110 Hypertensive heart disease with heart failure: Secondary | ICD-10-CM | POA: Diagnosis not present

## 2023-07-18 DIAGNOSIS — E039 Hypothyroidism, unspecified: Secondary | ICD-10-CM | POA: Insufficient documentation

## 2023-07-18 DIAGNOSIS — G4733 Obstructive sleep apnea (adult) (pediatric): Secondary | ICD-10-CM | POA: Insufficient documentation

## 2023-07-18 DIAGNOSIS — E785 Hyperlipidemia, unspecified: Secondary | ICD-10-CM | POA: Insufficient documentation

## 2023-07-18 DIAGNOSIS — I4821 Permanent atrial fibrillation: Secondary | ICD-10-CM | POA: Diagnosis not present

## 2023-07-18 MED ORDER — SPIRONOLACTONE 25 MG PO TABS: 25.0000 mg | ORAL_TABLET | Freq: Every day | ORAL | 3 refills | Status: DC

## 2023-07-18 MED ORDER — DILTIAZEM HCL ER COATED BEADS 120 MG PO CP24: 120.0000 mg | ORAL_CAPSULE | Freq: Two times a day (BID) | ORAL | Status: DC

## 2023-07-18 NOTE — Progress Notes (Incomplete)
   ADVANCED HEART FAILURE NEW PATIENT CLINIC NOTE  Referring Physician: Deatra James, MD  Primary Care: Deatra James, MD Primary Cardiologist:  HPI: Brittany [name] is a 89 y.o. female with a PMH of *** who presents for initial visit for further evaluation and treatment of heart failure/cardiomyopathy.      {Anything typed between these two boxes will persist and can be pulled forward to future notes. This phrase will delete itself when the note is signed :1}      SUBJECTIVE:   PMH, current medications, allergies, social history, and family history reviewed in epic.  PHYSICAL EXAM: There were no vitals filed for this visit. GENERAL: Well nourished and in no apparent distress at rest.  HEENT: The mucous membranes are pink and moist.   PULM:  Normal work of breathing, clear to auscultation bilaterally. Respirations are unlabored.  CARDIAC:  JVP: ***         Normal rate with regular rhythm. No murmurs, rubs or gallops.  *** edema.  ABDOMEN: Soft, non-tender, non-distended. NEUROLOGIC: Patient is oriented x3 with no focal or lateralizing neurologic deficits.  PSYCH: Patients affect is appropriate, there is no evidence of anxiety or depression.  SKIN: Warm and dry; no lesions or wounds. Warm and well perfused extremities.  DATA REVIEW  ECG: ***    ECHO: ***  CATH: ***    Heart failure review: - Classification: {HFCLASS:30917} - Etiology: {Cardiomyopathy:30918} - NYHA Class:  - Volume status: {volumechf:30919} - ACEi/ARB/ARNI: {HF:30752} - Aldosterone antagonist: {HF:30752} - Beta-blocker: {HF:30752} - Digoxin: {HF:30752} - Hydralazine/Nitrates: {HF:30752} - SGLT2i: {HF:30752} - GLP-1: {GLP:30906} - Advanced therapies: {Advancedtherapies:30916} - ICD: {ICD:30901}   ASSESSMENT & PLAN:  ***    Brittany Hasten, MD Advanced Heart Failure Mechanical Circulatory Support 07/18/23

## 2023-07-18 NOTE — Patient Instructions (Signed)
Great to see you today!!!  Medication Changes:  STOP Potassium  START Spironolactone 25 mg Daily  DECREASE Diltiazem to 120 mg (1 cap) Twice daily   Lab Work:  Your physician recommends that you return for lab work in: 2 weeks, we have provided you with a prescription to have these done at [facility]  Testing/Procedures:  You have been ordered a PYP Scan.  This is done at [facility], they will call you to schedule.  When you come for this test please plan to be there 2-3 hours.  They are located at: [address] [place], [place] [address] ONCE APPROVED BY INSURANCE YOU WILL BE CALLED TO SCHEDULE  Special Instructions // Education:  Do the following things EVERYDAY: Weigh yourself in the morning before breakfast. Write it down and keep it in a log. Take your medicines as prescribed Eat low salt foods--Limit salt (sodium) to 2000 mg per day.  Stay as active as you can everyday Limit all fluids for the day to less than 2 liters   Follow-Up in: 2 months   At the [facility], you and your health needs are our priority. We have a designated team specialized in the treatment of Heart Failure. This Care Team includes your primary Heart Failure Specialized Cardiologist (physician), Advanced Practice Providers (APPs- Physician Assistants and Nurse Practitioners), and Pharmacist who all work together to provide you with the care you need, when you need it.   You may see any of the following providers on your designated Care Team at your next follow up:  Dr. Arvilla Meres Dr. Marca Ancona Dr. Dorthula Nettles Dr. Theresia Bough Tonye Becket, NP Robbie Lis, [place] [facility] [place], [place] Brynda Peon, NP [place] Lee, NP Karle Plumber, PharmD   Please be sure to bring in all your medications bottles to every appointment.   Need to Contact us:  If you have any questions or concerns before your next appointment please send [place] a message through [place] or  call our office at [phone].    TO LEAVE A MESSAGE FOR THE NURSE SELECT OPTION 2, PLEASE LEAVE A MESSAGE INCLUDING: YOUR NAME DATE OF BIRTH CALL BACK NUMBER REASON FOR CALL**this is important as we prioritize the call backs  YOU WILL RECEIVE A CALL BACK THE SAME DAY AS LONG AS YOU CALL BEFORE 4:00 PM

## 2023-07-20 ENCOUNTER — Other Ambulatory Visit: Payer: Self-pay | Admitting: Internal Medicine

## 2023-07-25 ENCOUNTER — Telehealth (HOSPITAL_COMMUNITY): Payer: Self-pay | Admitting: *Deleted

## 2023-07-25 NOTE — Telephone Encounter (Signed)
 Left detailed message on patient's home voice message machine regarding her Amyloid Study for 08/01/23 at 12:30 per DPR.

## 2023-08-01 ENCOUNTER — Ambulatory Visit (HOSPITAL_COMMUNITY): Payer: Medicare Other | Attending: Cardiovascular Disease

## 2023-08-01 ENCOUNTER — Ambulatory Visit (HOSPITAL_COMMUNITY): Payer: Medicare Other

## 2023-08-01 DIAGNOSIS — I5032 Chronic diastolic (congestive) heart failure: Secondary | ICD-10-CM | POA: Diagnosis not present

## 2023-08-01 LAB — MYOCARDIAL AMYLOID PLANAR & SPECT: H/CL Ratio: 1.35

## 2023-08-01 MED ORDER — TECHNETIUM TC 99M PYROPHOSPHATE
19.9000 | Freq: Once | INTRAVENOUS | Status: AC
  Administered 2023-08-01: 19.9 via INTRAVENOUS

## 2023-08-06 DIAGNOSIS — I5032 Chronic diastolic (congestive) heart failure: Secondary | ICD-10-CM | POA: Diagnosis not present

## 2023-08-08 ENCOUNTER — Encounter (HOSPITAL_COMMUNITY): Payer: Self-pay | Admitting: Cardiology

## 2023-08-13 ENCOUNTER — Encounter (HOSPITAL_COMMUNITY): Payer: Self-pay | Admitting: Cardiology

## 2023-08-13 LAB — UPEP/UIFE/LIGHT CHAINS/TP, 24-HR UR: Kappa/Lambda Ratio,U: 0.53 *Deleted — ABNORMAL LOW (ref 1.83–14.26)

## 2023-08-14 LAB — PROTEIN ELECTROPHORESIS, SERUM
A/G Ratio: 1.3 (ref 0.7–1.7)
Albumin ELP: 3.9 g/dL (ref 2.9–4.4)
Alpha 1: 0.2 g/dL (ref 0.0–0.4)
Alpha 2: 0.7 g/dL (ref 0.4–1.0)
Beta: 1.1 g/dL (ref 0.7–1.3)
Gamma Globulin: 1 g/dL (ref 0.4–1.8)
Globulin, Total: 3.1 g/dL (ref 2.2–3.9)

## 2023-08-14 LAB — UPEP/UIFE/LIGHT CHAINS/TP, 24-HR UR
% BETA, Urine: 0 %
ALBUMIN, U: 100 %
ALPHA 1 URINE: 0 %
ALPHA-2-GLOBULIN, U: 0 %
Free Lambda Lt Chains,Ur: 6.42 mg/L (ref 1.17–86.46)
GAMMA GLOBULIN URINE: 0 %
Kappa/Lambda Ratio,U: 6.42 mg/L — ABNORMAL LOW (ref 1.83–15.21)
NOTE:: 3.42 mg/L (ref 1.17–86.46)
Protein, 24H Urine: 472 mg/(24.h) — ABNORMAL HIGH (ref 30–150)
Protein, Ur: 26.2 mg/dL

## 2023-08-14 LAB — IMMUNOFIXATION ELECTROPHORESIS
IgA/Immunoglobulin A, Serum: 229 mg/dL (ref 64–422)
IgG (Immunoglobin G), Serum: 1041 mg/dL (ref 586–1602)
IgM (Immunoglobulin M), Srm: 91 mg/dL (ref 26–217)
Total Protein: 7 g/dL (ref 6.0–8.5)

## 2023-08-14 LAB — KAPPA/LAMBDA LIGHT CHAINS
Ig Kappa Free Light Chain: 18.7 mg/L (ref 3.3–19.4)
Ig Lambda Free Light Chain: 14.7 mg/L (ref 5.7–26.3)
KAPPA/LAMBDA RATIO: 1.27 (ref 0.26–1.65)

## 2023-08-14 LAB — BASIC METABOLIC PANEL
BUN/Creatinine Ratio: 16 (ref 12–28)
BUN: 16 mg/dL (ref 8–27)
CO2: 23 mmol/L (ref 20–29)
Calcium: 10.8 mg/dL — ABNORMAL HIGH (ref 8.7–10.3)
Chloride: 95 mmol/L — ABNORMAL LOW (ref 96–106)
Creatinine, Ser: 0.97 mg/dL (ref 0.57–1.00)
Glucose: 175 mg/dL — ABNORMAL HIGH (ref 70–99)
Potassium: 5.1 mmol/L (ref 3.5–5.2)
Sodium: 134 mmol/L (ref 134–144)
eGFR: 57 mL/min/{1.73_m2} — ABNORMAL LOW (ref >59)

## 2023-08-20 ENCOUNTER — Ambulatory Visit (INDEPENDENT_AMBULATORY_CARE_PROVIDER_SITE_OTHER): Payer: Medicare Other | Admitting: Nurse Practitioner

## 2023-08-20 ENCOUNTER — Encounter: Payer: Self-pay | Admitting: Nurse Practitioner

## 2023-08-20 VITALS — BP 119/58 | HR 88 | Ht 68.0 in | Wt 212.0 lb

## 2023-08-20 DIAGNOSIS — R0609 Other forms of dyspnea: Secondary | ICD-10-CM

## 2023-08-20 DIAGNOSIS — E66811 Obesity, class 1: Secondary | ICD-10-CM

## 2023-08-20 DIAGNOSIS — I5032 Chronic diastolic (congestive) heart failure: Secondary | ICD-10-CM | POA: Diagnosis not present

## 2023-08-20 DIAGNOSIS — M1711 Unilateral primary osteoarthritis, right knee: Secondary | ICD-10-CM

## 2023-08-20 DIAGNOSIS — G4733 Obstructive sleep apnea (adult) (pediatric): Secondary | ICD-10-CM

## 2023-08-20 NOTE — Patient Instructions (Addendum)
Continue to use CPAP every night, minimum of 4-6 hours a night.  Change equipment as directed. Wash your tubing with warm soap and water daily, hang to dry. Wash humidifier portion weekly. Use bottled, distilled water and change daily Be aware of reduced alertness and do not drive or operate heavy machinery if experiencing this or drowsiness.  Exercise encouraged, as tolerated. Healthy weight management discussed.  Avoid or decrease alcohol consumption and medications that make you more sleepy, if possible. Notify if persistent daytime sleepiness occurs even with consistent use of PAP therapy.  We'll see about getting you a new CPAP machine since your's continues to overheat and is a replacement machine. Take this to [employer] and have them troubleshoot the machine and possibly get a download   You have had extensive testing for the shortness of breath you have been experiencing. Seems this mostly started after your previous knee surgery. We also tried an inhaler that didn't do much for you. You do not have any significant changes on your previous x ray aside from some decreased airflow through your lung bases. You do have some restriction in your lung function, which is likely due to your current weight. I believe a large component is related to deconditioning. However since we haven't gotten a CT of your chest yet and you would like to investigate further, we will obtain one. They should contact you to schedule this  Follow up with cardiology  Talk to your PCP about physical therapy referral   Follow up in 10-12 weeks with Dr. Wynona Neat (new pt 30 min slot). If symptoms do not improve or worsen, please contact office for sooner follow up or seek emergency care.

## 2023-08-20 NOTE — Progress Notes (Unsigned)
@Patient  ID: Brittany [name], female    DOB: 1934-06-29, 89 y.o.   MRN: [id number]  Chief Complaint  Patient presents with   Follow-up    Referring provider: Deatra James, MD  HPI: 89 year old female, former smoker (45 pack years) follow-up for obstructive sleep apnea.  She is a patient Dr. Craige Cotta and was last seen in office on 08/22/2021 by [facility] NP.  Past medical history significant for hypertension, A-fib on chronic anticoagulation, sinus node dysfunction status post pacemaker, HFpEF, hyperparathyroidism, osteoporosis, anxiety and depression, HLD.  TEST/EVENTS:  08/13/2012 PSG: AHI 24, SaO2 low of 72%, CPAP 8 07/03/2014 PSG: AHI 24.4, SaO2 low of 76%, PLMI 81.8, CPAP 8--> AHI 0,+S 12/31/2017 echocardiogram: EF 55 to 60%, mild AR 08/10/2021 echocardiogram: EF 55 to 60%.  G1 DD.  Moderately elevated PASP.  LA and RA moderately dilated.  Mild to moderate MVR.  TVR mild to moderate.  Mild dilation of the ascending aorta. 11/06/2021 PFT: FVC 87, FEV1 86, ratio 78, TLC 84, DLCOcor 91 10/11/2022 cardiopulmonary exercise test: FVC 74, FEV1 70, ratio 93; normal functional capacity. No cardiopulmonary limitation. Patient appears limited due to deconditioning from body habitus and MSK limitations. Pre-spirometry with restrictive disease, most likely due to body habitus. VE/VCO2 slope elevated-most likely end-exercise hyperventilation.  12/02/2022 echo: EF 60-65%. Unable to measure diastolic parameters. RV size and function nl. Nl PASP. LA and RA moderately dilated. Mild MR. Mild AR.  02/04/2023 CXR: low lung volumes, mild atelectasis at bases  05/25/2021: OV with Dr. Craige Cotta.  Follow-up for OSA.  Compliant with CPAP therapy; continue CPAP at 8 cmH2O  08/22/2021: OV with Mackenzie Groom NP for shortness of breath.  Began after she had LTKA in July and worsened after right total hip last month.  Slight increase in swelling in right lower extremity.  Continued on CPAP nightly.  Chronically anticoagulated on Eliquis; however was  off of it for 4 to 5 days when she had surgery.  Walking oximetry with very brief desaturation to 88%, recovered without oxygen to 90s.  Found to be anemic on labs; however, also had hyponatremia and BNP was elevated to 921.  Suspected heart failure and fluid overload contributing to dyspnea.  Started on Lasix and advised to follow-up with cardiology.  08/29/2021: OV with Valente Fosberg NP for follow up. Her shortness of breath has improved; although, she feels as though she is still not back to her baseline. Her lower extremity swelling has also resolved. She was seen by cardiology last week who did not think she needed to be on daily lasix. She completed her lasix yesterday. She denies cough, wheezing, orthopnea, PND, or chest pain. She has not tried any inhaler therapies. She did stop her DDAVP that she was previously taking daily, despite it being intended for PRN use. Continues on CPAP nightly.  11/06/2021: OV with Dr. Craige Cotta. CXR from 08/2021 shows mild cardiomegaly. SOB after Lt knee surgery in July and rt hip surgery in January. Had anemia after surgery. Started on lasix for CHF exacerbation. PFT today normal Hgb 09/26/2021 was 11.9. still gets SOB when walking; recovers with rest. CPAP machine is overheating and burning through water. No air leak. Suspect DOE combination of CHF, anemia, and deconditioning. No obvious lung disease.    01/06/2023: OV with Dr. Craige Cotta. Using replacement [employer]. Prior machine was 88 years old before she got the factory replacement. Compliant with CPAP. Orders placed for new CPAP.   08/20/2023: Today - follow up Discussed the use of AI scribe  software for clinical note transcription with the patient, who gave verbal consent to proceed.  History of Present Illness   The patient, accompanied by her husband, presents for follow up of shortness of breath and OSA. She has continued to have CPAP machine issues.  The patient has been experiencing ongoing issues with her CPAP machine,  which is a replacement machine following [employer] recall. The current [employer], causing disturbances in her sleep. Despite an order being placed in June, she has not received a new machine. She's not reached out to her DME regarding this. She does wear her CPAP nightly.   She reports shortness of breath with exertion. She recalls having breathing difficulties following surgeries for knee and hip replacements, which required a blood transfusion, in early 2023. An extensive workup, including lung function testing in April 2023, was normal, and a cardiopulmonary stress test showed a slight reduction in lung function felt to be related to body habitus but no cardiopulmonary disease. She does have a history of cardiomyopathy, A fib, sinus node dysfunction with pacemaker. She's been followed by the HF clinic for HFpEF and managed with diuretics, digoxin, and diltiazem. She has used an inhaler before, and previous trials of albuterol did not improve her symptoms. She denies any cough, wheezing, chest congestion, sinus symptoms, chest pain, orthopnea, leg swelling.   She does have chronic right knee pain, impacting her ability to walk and exercise. She has been involved in physical therapy post-surgery but no longer active in this. She does not exercise and is relatively sedentary. She has a history of smoking but no formal obstruction on PFT and no response to bronchodilators. She's frustrated with her breathing/activity tolerance.       Allergies  Allergen Reactions   Citalopram Hydrobromide Other (See Comments)    Other reaction(s): QT prolongation with Tikosyn   Adhesive [Tape] Swelling and Rash   Bacitracin-Polymyxin B Other (See Comments)    Other reaction(s): Unknown   Benzalkonium Chloride Rash    Pt was not aware of this allergy   Neosporin [Neomycin-Polymyxin-Gramicidin] Swelling and Rash    Immunization History  Administered Date(s) Administered   Fluad Quad(high Dose 65+)  04/25/2020   Influenza Split 07/01/2013, 04/21/2014, 04/27/2017, 04/12/2021   Influenza, High Dose Seasonal PF 04/12/2016, 04/22/2018, 04/22/2018, 04/22/2019, 04/25/2020, 05/03/2020, 05/03/2020   Influenza, Seasonal, Injecte, Preservative Fre 04/22/2013   Influenza,inj,Quad PF,6-35 Mos 05/05/2017   Influenza-Unspecified 04/22/2013, 07/01/2013, 04/21/2014, 05/06/2015, 05/05/2017, 04/21/2018   Moderna Covid-19 Fall Seasonal Vaccine 16yrs & older 05/29/2022   Moderna Covid-19 Vaccine Bivalent Booster 71yrs & up 04/12/2021   Moderna SARS-COV2 Booster Vaccination 05/24/2020   Moderna Sars-Covid-2 Vaccination 08/03/2019, 08/31/2019, 05/24/2020, 11/08/2020   PPD Test 11/30/2012, 01/01/2013   Pfizer(Comirnaty)Fall Seasonal Vaccine 12 years and older 03/23/2023   Pneumococcal Conjugate-13 05/18/2014   Pneumococcal Polysaccharide-23 07/23/2011, 10/28/2012   Td 10/28/2012   Tdap 07/22/2012, 10/28/2012    Past Medical History:  Diagnosis Date   Ankle fracture 2016   Arthritis    Atrial fibrillation (HCC)    Depression    Dysrhythmia    Elevated parathyroid hormone    Fracture of orbital floor with routine healing    GERD (gastroesophageal reflux disease)    History of colon polyps    benign   Hypercalcemia    Hypertension    Hyponatremia    Hypothyroid    Macular degeneration    wet in the right and dry in the left   OSA on CPAP    Osteoporosis  Peripheral vascular disease (HCC)    Presence of permanent cardiac pacemaker    PSVT (paroxysmal supraventricular tachycardia) (HCC)    Rotator cuff tear    Sinus node dysfunction (HCC)    a. s/p MDT pacemaker   Stroke [facility])    Urinary incontinence    Wrist fracture 2018    Tobacco History: Social History   Tobacco Use  Smoking Status Former   Current packs/day: 0.00   Average packs/day: 1 pack/day for 45.0 years (45.0 ttl pk-yrs)   Types: Cigarettes   Start date: 37   Quit date: 59   Years since quitting: 33.1  Smokeless  Tobacco Never  Tobacco Comments   quit smoking in 1992   Counseling given: Not Answered Tobacco comments: quit smoking in 1992   Outpatient Medications Prior to Visit  Medication Sig Dispense Refill   acetaminophen (TYLENOL) 500 MG tablet Take 500 mg by mouth 2 (two) times daily.     alendronate (FOSAMAX) 70 MG tablet Take 1 tablet (70 mg total) by mouth every 7 (seven) days. Take with a full glass of water on an empty stomach. 4 tablet 11   apixaban (ELIQUIS) 5 MG TABS tablet Take 1 tablet (5 mg total) by mouth 2 (two) times daily. 180 tablet 3   atorvastatin (LIPITOR) 40 MG tablet Take 40 mg by mouth every evening.     B Complex-C (B-COMPLEX WITH VITAMIN C) tablet Take 1 tablet by mouth daily with lunch.      Cholecalciferol (VITAMIN D3) 125 MCG (5000 UT) TABS Take 5,000 Units by mouth daily with breakfast.     dapagliflozin propanediol (FARXIGA) 10 MG TABS tablet Take 1 tablet (10 mg total) by mouth daily before breakfast. 90 tablet 3   digoxin (LANOXIN) 0.125 MG tablet Take 1 tablet (0.125 mg total) by mouth daily. 90 tablet 3   diltiazem (CARTIA XT) 120 MG 24 hr capsule Take 1 capsule (120 mg total) by mouth 2 (two) times daily.     furosemide (LASIX) 40 MG tablet Take 2 tablets (80 mg total) by mouth every other day. TAKE 80 MG ALTERNATING 40 MG EVERY OTHER DAY 90 tablet 2   levothyroxine (SYNTHROID) 88 MCG tablet Take 88 mcg by mouth daily before breakfast.     Lutein 20 MG TABS Take 20 mg by mouth daily with breakfast.     magnesium oxide (MAG-OX) 400 MG tablet Take 1 tablet (400 mg total) by mouth daily. 30 tablet 0   Melatonin 10 MG TABS Take 10-40 mg by mouth at bedtime as needed (sleep).     metoprolol succinate (TOPROL-XL) 50 MG 24 hr tablet Take 2 tablets by  mouth with or following a meal 3 times daily 540 tablet 0   Multiple Vitamin (MULTIVITAMIN WITH MINERALS) TABS tablet Take 1 tablet by mouth daily with breakfast.      Multiple Vitamins-Minerals (PRESERVISION AREDS 2 PO)  Take 1 capsule by mouth 2 (two) times daily with a meal.      sertraline (ZOLOFT) 50 MG tablet Take 1 tablet (50 mg total) by mouth daily. 30 tablet 2   spironolactone (ALDACTONE) 25 MG tablet Take 1 tablet (25 mg total) by mouth daily. 30 tablet 3   No facility-administered medications prior to visit.     Review of Systems:   Constitutional: No weight loss or gain, night sweats, fevers, chills, fatigue, or lassitude. HEENT: No headaches, difficulty swallowing, tooth/dental problems, or sore throat. No sneezing, itching, ear ache, nasal congestion, or post  nasal drip CV:  No chest pain, orthopnea, PND, swelling in lower extremities, anasarca, dizziness, palpitations, syncope Resp: +shortness of breath with exertion. No excess mucus or change in color of mucus. No productive or non-productive. No hemoptysis. No wheezing.  No chest wall deformity GI:  No heartburn, indigestion, abdominal pain, nausea, vomiting, diarrhea, change in bowel habits, loss of appetite, bloody stools.  GU: No dysuria, change in color of urine, urgency or frequency.  Skin: No rash, lesions, ulcerations MSK:  +chronic right knee pain  Neuro: No dizziness or lightheadedness.  Psych: No depression or anxiety. Mood stable.     Physical Exam:  BP (!) 119/58 (BP Location: Right Arm, Patient Position: Sitting, Cuff Size: Large)   Pulse 88   Ht 5\' 8"  (1.727 m)   Wt 212 lb (96.2 kg)   LMP 07/22/1968   SpO2 92%   BMI 32.23 kg/m   GEN: Pleasant, interactive, well-appearing; elderly; obese; in no acute distress. HEENT:  Normocephalic and atraumatic. PERRLA. Sclera white. Nasal turbinates pink, moist and patent bilaterally. No rhinorrhea present. Oropharynx pink and moist, without exudate or edema. No lesions, ulcerations, or postnasal drip.  NECK:  Supple w/ fair ROM. No JVD present. Normal carotid impulses w/o bruits. Thyroid symmetrical with no goiter or nodules palpated. No lymphadenopathy.   CV: RRR, no m/r/g, no  peripheral edema. Pulses intact, +2 bilaterally. No cyanosis, pallor or clubbing. PULMONARY:  Unlabored, regular breathing. Clear bilaterally A&P w/o wheezes/rales/rhonchi. No accessory muscle use. No dullness to percussion. GI: BS present and normoactive. Soft, non-tender to palpation. No organomegaly or masses detected.  MSK: No erythema, warmth or tenderness. Cap refil <2 sec all extrem. No deformities or joint swelling noted.  Neuro: A/Ox3. No focal deficits noted.   Skin: Warm, no lesions or rashe Psych: Normal affect and behavior. Judgement and thought content appropriate.     Lab Results:  CBC    Component Value Date/Time   WBC 6.6 02/04/2023 1957   RBC 4.47 02/04/2023 1957   HGB 13.9 02/04/2023 1957   HGB 14.0 08/21/2022 1631   HGB 14.5 10/28/2012 0000   HCT 42.8 02/04/2023 1957   HCT 42.9 08/21/2022 1631   PLT 306 02/04/2023 1957   PLT 226 08/21/2022 1631   PLT 273 07/24/2012 0807   MCV 95.7 02/04/2023 1957   MCV 98 (H) 08/21/2022 1631   MCH 31.1 02/04/2023 1957   MCHC 32.5 02/04/2023 1957   RDW 13.6 02/04/2023 1957   RDW 12.6 08/21/2022 1631   RDW 13.5 10/28/2012 1300   LYMPHSABS 0.9 02/04/2023 1957   LYMPHSABS 22 10/28/2012 1300   MONOABS 1.0 02/04/2023 1957   EOSABS 0.1 02/04/2023 1957   EOSABS 0 10/28/2012 1300   BASOSABS 0.0 02/04/2023 1957   BASOSABS 1 10/28/2012 1300    BMET    Component Value Date/Time   NA 134 08/06/2023 1043   K 5.1 08/06/2023 1043   K 3.9 10/28/2012 1246   CL 95 (L) 08/06/2023 1043   CL 100 10/28/2012 1246   CO2 23 08/06/2023 1043   GLUCOSE 175 (H) 08/06/2023 1043   GLUCOSE 105 (H) 02/04/2023 1957   BUN 16 08/06/2023 1043   BUN 9 10/28/2012 1246   CREATININE 0.97 08/06/2023 1043   CREATININE 0.59 (L) 08/14/2016 0930   CALCIUM 10.8 (H) 08/06/2023 1043   CALCIUM 10.4 10/28/2012 1246   GFRNONAA >60 02/04/2023 1957   GFRNONAA 87 08/14/2016 0930   GFRAA 92 05/02/2020 1444   GFRAA >89 08/14/2016 0930  BNP    Component  Value Date/Time   BNP 411.3 (H) 02/04/2023 1957     Imaging:  MYOCARDIAL AMYLOID IMAGING PLANAR AND SPECT Result Date: 08/01/2023   Myocardial uptake was positive for radiotracer uptake. The visual grade of myocardial uptake relative to the ribs was Grade 1 (Myocardial uptake less than rib uptake).   Findings are not suggestive (Grade 1) with ratio of 1.35 which is < 1.5  of cardiac ATTR amyloidosis.   Prior study not available for comparison.    technetium pyrophosphate Tc 25m injection 19.9 millicurie     Date Action Dose Route User   08/01/2023 1111 Given 19.9 millicurie Intravenous Hornowski Kubak, [place]          Latest Ref Rng & Units 11/06/2021   12:40 PM  PFT Results  FVC-Pre L 2.43   FVC-Predicted Pre % 87   FVC-Post L 2.45   FVC-Predicted Post % 88   Pre FEV1/FVC % % 73   Post FEV1/FCV % % 78   FEV1-Pre L 1.78   FEV1-Predicted Pre % 86   FEV1-Post L 1.91   DLCO uncorrected ml/min/mmHg 17.67   DLCO UNC% % 86   DLCO corrected ml/min/mmHg 18.59   DLCO COR %Predicted % 91   DLVA Predicted % 113   TLC L 4.67   TLC % Predicted % 84   RV % Predicted % 81     No results found for: "NITRICOXIDE"      Assessment & Plan:   OSA on CPAP Moderate OSA on CPAP. Unable to view compliance. Excellent per her report. Receives benefit from use. Machine is not functioning properly. Contacted [employer] who stated she was not due for new CPAP until Septemeber 2025. Have advised the patient to take her machine to the DME for troubleshooting and to pull compliance report. Verbalized understanding. Encouraged her to continue wearing nightly. Aware of proper care/use. She does not drive.   Patient Instructions  Continue to use CPAP every night, minimum of 4-6 hours a night.  Change equipment as directed. Wash your tubing with warm soap and water daily, hang to dry. Wash humidifier portion weekly. Use bottled, distilled water and change daily Be aware of reduced alertness and do not  drive or operate heavy machinery if experiencing this or drowsiness.  Exercise encouraged, as tolerated. Healthy weight management discussed.  Avoid or decrease alcohol consumption and medications that make you more sleepy, if possible. Notify if persistent daytime sleepiness occurs even with consistent use of PAP therapy.  We'll see about getting you a new CPAP machine since your's continues to overheat and is a replacement machine. Take this to [employer] and have them troubleshoot the machine and possibly get a download   You have had extensive testing for the shortness of breath you have been experiencing. Seems this mostly started after your previous knee surgery. We also tried an inhaler that didn't do much for you. You do not have any significant changes on your previous x ray aside from some decreased airflow through your lung bases. You do have some restriction in your lung function, which is likely due to your current weight. I believe a large component is related to deconditioning. However since we haven't gotten a CT of your chest yet and you would like to investigate further, we will obtain one. They should contact you to schedule this  Follow up with cardiology  Talk to your PCP about physical therapy referral   Follow up in 10-12 weeks  with Dr. Wynona Neat (new pt 30 min slot). If symptoms do not improve or worsen, please contact office for sooner follow up or seek emergency care.    DOE (dyspnea on exertion) Persistent DOE following joint replacement surgeries early 2023. Overall stable but fails to improve. Extensive workup including normal PFT, unremarkable cardiopulmonary stress test. Recent echo with normal EF. Felt to be well managed per cardiology's last OV. Suspect DOE primarily due to CHF, cardiomyopathy, body habitus and deconditioning following knee and hip replacements and lack of physical activity due to chronic right knee pain. Previous use of albuterol inhaler did not improve  symptoms, suggesting non-pulmonary etiology as well. She does have a previously smoking history. She has not had a CT chest scan since symptom onset. Last scan 2017 showed micronodularity and mild btx. She has no infectious symptoms or cough that I would expect to see with btx/chronic indolent infection. Prior PFT without obstruction so low suspicion BTX driving factor. She is a former smoker but again, no evidence of COPD on testing and no benefit from bronchodilators. Will repeat CT scan of the chest to rule out any missed pathology. Discussed exercise program at the [facility] or physical therapy with primary care for deconditioning. Prefers physical therapy at residence. Will reach out to her PCP or ortho to see if they can help coordinate this. No evidence of significant anemia on last CBC. No hypoxia on exertion during walk test on room air.   Chronic heart failure with preserved ejection fraction (HFpEF) (HCC) Euvolemic on exam. See above. Follow up with cardiology as scheduled  Obesity (BMI 30.0-34.9) BMI 32. Healthy weight loss encouraged.   Osteoarthritis of right knee Contributing to mobility issues. Follow up with ortho to discuss treatment options.     Noemi Chapel, NP 08/21/2023  Pt aware and understands NP's role.

## 2023-08-21 ENCOUNTER — Encounter: Payer: Self-pay | Admitting: Nurse Practitioner

## 2023-08-21 DIAGNOSIS — M1711 Unilateral primary osteoarthritis, right knee: Secondary | ICD-10-CM | POA: Insufficient documentation

## 2023-08-21 NOTE — Assessment & Plan Note (Signed)
BMI 32. Healthy weight loss encouraged.

## 2023-08-21 NOTE — Assessment & Plan Note (Signed)
Moderate OSA on CPAP. Unable to view compliance. Excellent per her report. Receives benefit from use. Machine is not functioning properly. Contacted [employer] who stated she was not due for new CPAP until Septemeber 2025. Have advised the patient to take her machine to the DME for troubleshooting and to pull compliance report. Verbalized understanding. Encouraged her to continue wearing nightly. Aware of proper care/use. She does not drive.   Patient Instructions  Continue to use CPAP every night, minimum of 4-6 hours a night.  Change equipment as directed. Wash your tubing with warm soap and water daily, hang to dry. Wash humidifier portion weekly. Use bottled, distilled water and change daily Be aware of reduced alertness and do not drive or operate heavy machinery if experiencing this or drowsiness.  Exercise encouraged, as tolerated. Healthy weight management discussed.  Avoid or decrease alcohol consumption and medications that make you more sleepy, if possible. Notify if persistent daytime sleepiness occurs even with consistent use of PAP therapy.  We'll see about getting you a new CPAP machine since your's continues to overheat and is a replacement machine. Take this to [employer] and have them troubleshoot the machine and possibly get a download   You have had extensive testing for the shortness of breath you have been experiencing. Seems this mostly started after your previous knee surgery. We also tried an inhaler that didn't do much for you. You do not have any significant changes on your previous x ray aside from some decreased airflow through your lung bases. You do have some restriction in your lung function, which is likely due to your current weight. I believe a large component is related to deconditioning. However since we haven't gotten a CT of your chest yet and you would like to investigate further, we will obtain one. They should contact you to schedule this  Follow up with  cardiology  Talk to your PCP about physical therapy referral   Follow up in 10-12 weeks with Dr. Wynona Neat (new pt 30 min slot). If symptoms do not improve or worsen, please contact office for sooner follow up or seek emergency care.

## 2023-08-21 NOTE — Assessment & Plan Note (Addendum)
Persistent DOE following joint replacement surgeries early 2023. Overall stable but fails to improve. Extensive workup including normal PFT, unremarkable cardiopulmonary stress test. Recent echo with normal EF. Felt to be well managed per cardiology's last OV. Suspect DOE primarily due to CHF, cardiomyopathy, body habitus and deconditioning following knee and hip replacements and lack of physical activity due to chronic right knee pain. Previous use of albuterol inhaler did not improve symptoms, suggesting non-pulmonary etiology as well. She does have a previously smoking history. She has not had a CT chest scan since symptom onset. Last scan 2017 showed micronodularity and mild btx. She has no infectious symptoms or cough that I would expect to see with btx/chronic indolent infection. Prior PFT without obstruction so low suspicion BTX driving factor. She is a former smoker but again, no evidence of COPD on testing and no benefit from bronchodilators. Will repeat CT scan of the chest to rule out any missed pathology. Discussed [facility] program at [facility] or physical therapy with primary care for deconditioning. Prefers physical therapy at residence. Will reach out to her PCP or ortho to see if they can help coordinate this. No evidence of significant anemia on last CBC. No hypoxia on exertion during walk test on room air.

## 2023-08-21 NOTE — Assessment & Plan Note (Signed)
Contributing to mobility issues. Follow up with ortho to discuss treatment options.

## 2023-08-21 NOTE — Assessment & Plan Note (Signed)
Euvolemic on exam. See above. Follow up with cardiology as scheduled.

## 2023-08-25 ENCOUNTER — Encounter: Payer: Self-pay | Admitting: Internal Medicine

## 2023-09-11 ENCOUNTER — Ambulatory Visit (INDEPENDENT_AMBULATORY_CARE_PROVIDER_SITE_OTHER): Payer: Medicare Other

## 2023-09-11 DIAGNOSIS — I495 Sick sinus syndrome: Secondary | ICD-10-CM | POA: Diagnosis not present

## 2023-09-12 ENCOUNTER — Ambulatory Visit
Admission: RE | Admit: 2023-09-12 | Discharge: 2023-09-12 | Disposition: A | Discharge status: Home / Self Care | Payer: Medicare Other | Source: Ambulatory Visit | Attending: Nurse Practitioner | Admitting: Nurse Practitioner

## 2023-09-12 DIAGNOSIS — R0609 Other forms of dyspnea: Secondary | ICD-10-CM

## 2023-09-12 LAB — CUP PACEART REMOTE DEVICE CHECK
Battery Remaining Longevity: 149 mo
Battery Voltage: 3.05 V
Brady Statistic RA Percent Paced: 0 %
Brady Statistic RV Percent Paced: 96.15 %
Date Time Interrogation Session: 20250220010421
Implantable Lead Connection Status: 753985
Implantable Lead Connection Status: 753985
Implantable Lead Implant Date: 20121213
Implantable Lead Implant Date: 20121213
Implantable Lead Location: 753859
Implantable Lead Location: 753860
Implantable Lead Model: 4076
Implantable Lead Model: 4076
Implantable Lead Serial Number: 835025
Implantable Lead Serial Number: 869269
Implantable Pulse Generator Implant Date: 20240201
Lead Channel Impedance Value: 304 Ohm
Lead Channel Impedance Value: 380 Ohm
Lead Channel Impedance Value: 380 Ohm
Lead Channel Impedance Value: 513 Ohm
Lead Channel Pacing Threshold Amplitude: 0.875 V
Lead Channel Pacing Threshold Pulse Width: 0.4 ms
Lead Channel Sensing Intrinsic Amplitude: 0.5 mV
Lead Channel Sensing Intrinsic Amplitude: 0.5 mV
Lead Channel Sensing Intrinsic Amplitude: 11.375 mV
Lead Channel Sensing Intrinsic Amplitude: 11.375 mV
Lead Channel Setting Pacing Amplitude: 2 V
Lead Channel Setting Pacing Pulse Width: 0.4 ms
Lead Channel Setting Sensing Sensitivity: 0.9 mV
Zone Setting Status: 755011
Zone Setting Status: 755011

## 2023-09-17 DIAGNOSIS — M1711 Unilateral primary osteoarthritis, right knee: Secondary | ICD-10-CM | POA: Diagnosis not present

## 2023-09-19 NOTE — Progress Notes (Signed)
 Pt has some new ground glass areas in upper lobes on CT imaging. This is a pretty nonspecific finding. Can you ask if she is having any acute symptoms? Cough, chest congestion, etc. If not, let's plan to repeat in 3-6 months.  Nodules are calcified and stable so do not need follow up.  She does have a new 1.4 cm lesion on her pancreas. Needs to call her PCP regarding this to discuss next steps.  She has plaque buildup and CAD, which she is followed by cardiology for.  Thanks

## 2023-09-23 ENCOUNTER — Telehealth (HOSPITAL_COMMUNITY): Payer: Self-pay | Admitting: *Deleted

## 2023-09-23 NOTE — Progress Notes (Signed)
Spoke with pt and notified of results per Dr. Wert. Pt verbalized understanding and denied any questions. 

## 2023-09-23 NOTE — Telephone Encounter (Signed)
 Called patient and reminded her of appointment tomorrow at 2:00 in [facility] to see Dr. Elwyn Lade. She confirmed appointment.

## 2023-09-24 ENCOUNTER — Ambulatory Visit (HOSPITAL_COMMUNITY)
Admission: RE | Admit: 2023-09-24 | Discharge: 2023-09-24 | Disposition: A | Discharge status: Home / Self Care | Payer: Medicare Other | Source: Ambulatory Visit | Attending: Cardiology | Admitting: Cardiology

## 2023-09-24 ENCOUNTER — Encounter (HOSPITAL_COMMUNITY): Payer: Self-pay | Admitting: Cardiology

## 2023-09-24 VITALS — BP 160/78 | HR 60 | Wt 210.2 lb

## 2023-09-24 DIAGNOSIS — G4733 Obstructive sleep apnea (adult) (pediatric): Secondary | ICD-10-CM | POA: Insufficient documentation

## 2023-09-24 DIAGNOSIS — Z79899 Other long term (current) drug therapy: Secondary | ICD-10-CM | POA: Insufficient documentation

## 2023-09-24 DIAGNOSIS — I4821 Permanent atrial fibrillation: Secondary | ICD-10-CM | POA: Diagnosis not present

## 2023-09-24 DIAGNOSIS — Z96659 Presence of unspecified artificial knee joint: Secondary | ICD-10-CM | POA: Insufficient documentation

## 2023-09-24 DIAGNOSIS — Z950 Presence of cardiac pacemaker: Secondary | ICD-10-CM | POA: Insufficient documentation

## 2023-09-24 DIAGNOSIS — Z7901 Long term (current) use of anticoagulants: Secondary | ICD-10-CM | POA: Diagnosis not present

## 2023-09-24 DIAGNOSIS — I495 Sick sinus syndrome: Secondary | ICD-10-CM | POA: Diagnosis not present

## 2023-09-24 DIAGNOSIS — E785 Hyperlipidemia, unspecified: Secondary | ICD-10-CM | POA: Diagnosis not present

## 2023-09-24 DIAGNOSIS — I5032 Chronic diastolic (congestive) heart failure: Secondary | ICD-10-CM | POA: Insufficient documentation

## 2023-09-24 DIAGNOSIS — I110 Hypertensive heart disease with heart failure: Secondary | ICD-10-CM | POA: Diagnosis not present

## 2023-09-24 DIAGNOSIS — R0609 Other forms of dyspnea: Secondary | ICD-10-CM | POA: Diagnosis not present

## 2023-09-24 NOTE — Progress Notes (Signed)
   ADVANCED HEART FAILURE FOLLOW UP CLINIC NOTE  Referring Physician: Deatra James, MD  Primary Care: Deatra James, MD Primary Cardiologist:  HPI: Brittany [name] is a 89 y.o. female with a PMH of stroke, SSSx w/PPM, Afib, ATach, HTN, HLD, hypothyroidism, OSA w/CPAP, suspected HFpEF who presents for initial visit for follow up of diastolic heart failure.     {Anything typed between these two boxes will persist and can be pulled forward to future notes. This phrase will delete itself when the note is signed :1}   @HCHISTORYNARRATIVE @     SUBJECTIVE:  PMH, current medications, allergies, social history, and family history reviewed in epic.  PHYSICAL EXAM: There were no vitals filed for this visit. GENERAL: Well nourished and in no apparent distress at rest.  PULM:  Normal work of breathing, clear to auscultation bilaterally. Respirations are unlabored.  CARDIAC:  JVP: ***         Normal rate with regular rhythm. No murmurs, rubs or gallops.  *** edema. Warm and well perfused extremities. ABDOMEN: Soft, non-tender, non-distended. NEUROLOGIC: Patient is oriented x3 with no focal or lateralizing neurologic deficits.    DATA REVIEW  ECG: VVIR     ECHO: 11/2022: Normal LVEF, diastolic function not interpretable due to afib   CATH: None   10/11/22: CPX  Attending: Normal functional capacity when compared to age-matched subjects. When the measured peak VO2 is adjusted for her ideal body weight there is significant improvement suggesting the patient's obesity is likely contributing significantly to her exercise intolerance. The VE/VCO2 is markedly elevated and can be related to elevated filling pressures during exercise; however given the low PETCO2 suspect may be more related to end-exercise hyperventilation. If symptoms persist, can consider exercise testing with invasive hemodynamic monitoring.    Heart failure review: - Classification: {HFCLASS:30917} - Etiology:  {Cardiomyopathy:30918} - NYHA Class:  - Volume status: {volumechf:30919} - ACEi/ARB/ARNI: {HF:30752} - Aldosterone antagonist: {HF:30752} - Beta-blocker: {HF:30752} - Digoxin: {HF:30752} - Hydralazine/Nitrates: {HF:30752} - SGLT2i: {HF:30752} - GLP-1: {GLP:30906} - Advanced therapies: {Advancedtherapies:30916} - ICD: {ICD:30901}  ASSESSMENT & PLAN:  Chronic diastolic heart failure: Patient with ongoing dyspnea on exertion out of proportion to her degree of heart failure. Previous CPET most likely consistent with deconditioning, though VE/VCO2 significantly elevated. Given VVIR will decrease nodal blockade, with her signficant symptoms and falls will also rule out cardiac amyloid. - Decrease diltiazem to 120mg  BID - Start spironolactone 25mg  daily - Stop supplemental K - Labs in 2 weeks - AL amyloid workup at that time - PYP scan - Likely stop dig at next visit - Continue farxiga 10mg  and lasix 40mg  daily   Atrial fibrillation: Permanent, VVIR. High pacing burden, will need serial echos to ensure preserved EF. - Conitnue apixaban 5mg  BID - Nodal blockade as above - Yearly echos   SSS: Pacemaker in place. - Follows with EP   HTN: Elevated in clinic today. - Addition of spironolactone as above  Follow up in ***  [name], MD Advanced Heart Failure Mechanical Circulatory Support 09/24/23

## 2023-09-24 NOTE — Patient Instructions (Signed)
 Medication Changes:  DECREASE Diltizem 120mg  (1 tab) daily for 1 week. Then STOP Diltizem.   Special Instructions // Education:  Do the following things EVERYDAY: Weigh yourself in the morning before breakfast. Write it down and keep it in a log. Take your medicines as prescribed Eat low salt foods--Limit salt (sodium) to 2000 mg per day.  Stay as active as you can everyday Limit all fluids for the day to less than 2 liters   Follow-Up in: Please follow up with the Advanced [facility] Failure Clinic in 3 months. We currently do not have that schedule. Please call us in late April in order to schedule your appointment for June.  At the [facility], you and your health needs are our priority. We have a designated team specialized in the treatment of Heart Failure. This Care Team includes your primary Heart Failure Specialized Cardiologist (physician), Advanced Practice Providers (APPs- Physician Assistants and Nurse Practitioners), and Pharmacist who all work together to provide you with the care you need, when you need it.   You may see any of the following providers on your designated Care Team at your next follow up:  Dr. Arvilla Meres Dr. Marca Ancona Dr. Dorthula Nettles Dr. Theresia Bough Tonye Becket, NP Robbie Lis, [place] [facility] [place], [place] Brynda Peon, NP [place] Lee, NP Karle Plumber, PharmD   Please be sure to bring in all your medications bottles to every appointment.   Need to Contact us:  If you have any questions or concerns before your next appointment please send [place] a message through [place] or call our office at [phone].    TO LEAVE A MESSAGE FOR THE NURSE SELECT OPTION 2, PLEASE LEAVE A MESSAGE INCLUDING: YOUR NAME DATE OF BIRTH CALL BACK NUMBER REASON FOR CALL**this is important as we prioritize the call backs  YOU WILL RECEIVE A CALL BACK THE SAME DAY AS LONG AS YOU CALL BEFORE 4:00 PM

## 2023-10-01 DIAGNOSIS — Z08 Encounter for follow-up examination after completed treatment for malignant neoplasm: Secondary | ICD-10-CM | POA: Diagnosis not present

## 2023-10-01 DIAGNOSIS — Z85820 Personal history of malignant melanoma of skin: Secondary | ICD-10-CM | POA: Diagnosis not present

## 2023-10-01 DIAGNOSIS — Z1283 Encounter for screening for malignant neoplasm of skin: Secondary | ICD-10-CM | POA: Diagnosis not present

## 2023-10-01 DIAGNOSIS — L820 Inflamed seborrheic keratosis: Secondary | ICD-10-CM | POA: Diagnosis not present

## 2023-10-02 ENCOUNTER — Telehealth: Payer: Self-pay | Admitting: *Deleted

## 2023-10-02 ENCOUNTER — Encounter: Payer: Self-pay | Admitting: Internal Medicine

## 2023-10-02 NOTE — Telephone Encounter (Signed)
 Fax received from Dr. Jerl Santos with [facility] to perform a right total knee arthroplasty under spinal anesthesia on patient.  Patient needs surgery clearance. Surgery is pending clearance . Patient was seen on 08/20/23. Office protocol is a risk assessment can be sent to surgeon if patient has been seen in 60 days or less.   Sending to [facility] for risk assessment or recommendations if patient needs to be seen in office prior to surgical procedure.

## 2023-10-02 NOTE — Telephone Encounter (Signed)
 She has OV with Dr. Wynona Neat 4/9. If needed prior to this, can provide risk assessment if symptoms stable and no change since our prior visit but please verify with pt. Thanks!

## 2023-10-02 NOTE — Telephone Encounter (Signed)
 Moderate risk. Factors that increase the risk for postoperative pulmonary complications are OSA, age, CHF, a fib   Respiratory complications generally occur in 1% of ASA Class I patients, 5% of ASA Class II and 10% of ASA Class III-IV patients These complications rarely result in mortalityand include postoperative pneumonia, atelectasis, pulmonary embolism, ARDS and increased time requiring postoperative mechanical ventilation.  Overall, I recommend proceeding with the surgeryif the risk for respiratory complications are outweighed by the potential benefits. This will need to be discussed between the patient and surgeon.  To reduce risksof respiratory complications, I recommend: --Pre- and post-operative incentive spirometry performed frequently while awake --Inpatient use of currently prescribed positive-pressure for OSA whenever the patient is sleeping --Short duration of surgery as much as possible and avoid paralytic if possible  --OOB, encourage mobility post-op   1) RISK FOR PROLONGED MECHANICAL VENTILAION - > 48h  1A) Arozullah - Prolonged mech ventilation risk Arozullah Postperative Pulmonary Risk Score - for mech ventilation dependence >48h USAA, Ann Surg 2000, major non-cardiac surgery) Comment Score Type of surgery - abd ao aneurysm (27), thoracic (21), neurosurgery / upper abdominal / vascular (21), neck (11)  Right TKA 6  Emergency Surgery - (11)   ALbumin < 3 or poor nutritional state - (9)   BUN > 30 -  (8)   Partial or completely dependent functional status - (7)   COPD -  (6)   Age - 60 to 69 (4), > 70  (6) 6 TOTAL : 12 - 1.8%  Risk Stratifcation scores  - < 10 (0.5%), 11-19 (1.8%), 20-27 (4.2%), 28-40 (10.1%), >40 (26.6%)

## 2023-10-02 NOTE — Telephone Encounter (Signed)
   Pre-operative Risk Assessment    Patient Name: Brittany [name]  DOB: 03/16/1934 MRN: [id number]   Date of last office visit: 06/26/2023 Date of next office visit: None  Request for Surgical Clearance    Procedure:   Right Total Knee Arthroplasty.   Date of Surgery:  Clearance TBD                                 Surgeon:  Dr. Marcene Corning Surgeon's Group Practice Name:  [facility] Phone number:  [phone] Fax number:  [phone]   Type of Clearance Requested:   - Medical  - Pharmacy:  Hold Apixaban (Eliquis) Not Indicated   Type of Anesthesia:  Spinal   Additional requests/questions:    Signed, Emmit Pomfret   10/02/2023, 11:03 AM

## 2023-10-02 NOTE — Telephone Encounter (Signed)
 Copy of this encounter printed and faxed to [facility] at [phone].

## 2023-10-02 NOTE — Telephone Encounter (Signed)
 Called and spoke with the pt  She is aware needs clearance and does not want to wait until appt in April with Dr Val Eagle  Unfortunately no sooner openings  Pt states she feels stable since last visit here 08/20/23

## 2023-10-02 NOTE — Progress Notes (Signed)
 PERIOPERATIVE PRESCRIPTION FOR IMPLANTED CARDIAC DEVICE PROGRAMMING   Patient Information:  Patient: Brittany [name]  MRN: [id number]  Date of Birth: Jul 26, 1933    Procedure:   Right Total Knee Arthroplasty. Date of Surgery:  Clearance TBD                              Surgeon:  Dr. Marcene Corning Surgeon's Group Practice Name:  [facility] Phone number:  [phone] Fax number:  [phone]   Device Information:   Clinic EP Physician:   Dr. Sherryl Manges Device Type:  Pacemaker Manufacturer and Phone #:  [employer]tronic: [id number] Pacemaker Dependent?:  No Date of Last Device Check:  09/24/2023      Normal Device Function?:  Yes     Electrophysiologist's Recommendations:   Have magnet available. Provide continuous ECG monitoring when magnet is used or reprogramming is to be performed.  Procedure should not interfere with device function.  No device programming or magnet placement needed.  Per Device Clinic Standing Orders, Lenor Coffin  10/02/2023 11:10 AM

## 2023-10-03 ENCOUNTER — Telehealth (HOSPITAL_COMMUNITY): Payer: Self-pay

## 2023-10-03 NOTE — Telephone Encounter (Signed)
   Patient Name: Brittany [name]  DOB: [birth year] MRN: [id number]  Primary Cardiologist: Sherryl Manges, MD  Chart reviewed as part of pre-operative protocol coverage. Given past medical history and time since last visit, based on ACC/AHA guidelines, [name] is at acceptable risk for the planned procedure without further cardiovascular testing.  Patient was seen in the office on 09/24/2023 by Dr. Elwyn Lade and was cleared for surgery.  Patient previously approved by Dr. Graciela Husbands to hold Eliquis 3 days prior to procedure.  Patient may hold Eliquis for 3 days prior to procedure. Please resume Eliquis as soon as possible postprocedure, at the discretion of the surgeon.   I will route this recommendation as well as the note from most recent office visit to the requesting party via Epic fax function and remove from pre-op pool.  Please call with questions.  Joylene Grapes, NP 10/03/2023, 1:50 PM

## 2023-10-03 NOTE — Telephone Encounter (Signed)
 Patient with diagnosis of afib on Eliquis for anticoagulation.    Procedure:  Right Total Knee Arthroplasty  Date of procedure: TBD   CHA2DS2-VASc Score = 7   This indicates a 11.2% annual risk of stroke. The patient's score is based upon: CHF History: 1 HTN History: 1 Diabetes History: 0 Stroke History: 2 Vascular Disease History: 0 Age Score: 2 Gender Score: 1      CrCl 49 ml/min Platelet count 306  Patient previously approved by Dr. Graciela Husbands to hold Eliquis 3 days prior to procedure.  Patient may hold Eliquis for 3 days prior to procedure.  **This guidance is not considered finalized until pre-operative APP has relayed final recommendations.**

## 2023-10-03 NOTE — Telephone Encounter (Signed)
  ADVANCED HEART FAILURE CLINIC   Pre-operative Risk Assessment   HEARTCARE STAFF-IMPORTANT INSTRUCTIONS 1 Red and Blue Text will auto delete once note is signed or closed. 2 Press F2 to navigate through template.   3 On drop down lists, L click to select >> R click to activate next field 4 Reason for Visit format is IMPORTANT!!  See Directions on No. 2 below. 5 Please review chart to determine if there is already a clearance note open for this procedure!!  DO NOT duplicate if a note already exists!!    :1}      Request for Surgical Clearance    Procedure:   Right Total Knee Arthroplasty  Date of Surgery:  Clearance TBD                                 Surgeon:  Marcene Corning Surgeon's Group or Practice Name: [facility] Phone number:  [phone] Fax number:  [phone]   Type of Clearance Requested:   - Medical    Type of Anesthesia:  Spinal   Additional requests/questions:   Pre-Operative Risk Assessment   Signed, Linda Hedges   10/03/2023, 10:39 AM   Advanced Heart Failure Clinic [name] [facility] [address] [facility] [place] [address] [phone] (office) [phone] (fax)

## 2023-10-07 ENCOUNTER — Encounter: Payer: Self-pay | Admitting: Internal Medicine

## 2023-10-08 DIAGNOSIS — I959 Hypotension, unspecified: Secondary | ICD-10-CM | POA: Diagnosis not present

## 2023-10-08 DIAGNOSIS — R55 Syncope and collapse: Secondary | ICD-10-CM | POA: Diagnosis not present

## 2023-10-13 DIAGNOSIS — M9901 Segmental and somatic dysfunction of cervical region: Secondary | ICD-10-CM | POA: Diagnosis not present

## 2023-10-13 DIAGNOSIS — M9903 Segmental and somatic dysfunction of lumbar region: Secondary | ICD-10-CM | POA: Diagnosis not present

## 2023-10-13 DIAGNOSIS — M9904 Segmental and somatic dysfunction of sacral region: Secondary | ICD-10-CM | POA: Diagnosis not present

## 2023-10-13 DIAGNOSIS — M9902 Segmental and somatic dysfunction of thoracic region: Secondary | ICD-10-CM | POA: Diagnosis not present

## 2023-10-14 ENCOUNTER — Other Ambulatory Visit (HOSPITAL_COMMUNITY): Payer: Self-pay | Admitting: Cardiology

## 2023-10-14 DIAGNOSIS — I5032 Chronic diastolic (congestive) heart failure: Secondary | ICD-10-CM

## 2023-10-14 MED ORDER — SPIRONOLACTONE 25 MG PO TABS: 25.0000 mg | ORAL_TABLET | Freq: Every day | ORAL | 3 refills | Status: DC

## 2023-10-15 ENCOUNTER — Encounter: Payer: Self-pay | Admitting: Cardiology

## 2023-10-15 NOTE — Addendum Note (Signed)
 Addended by: Elease Etienne A on: 10/15/2023 10:37 AM   Modules accepted: Orders

## 2023-10-15 NOTE — Progress Notes (Signed)
 Remote pacemaker transmission.

## 2023-10-16 DIAGNOSIS — M9902 Segmental and somatic dysfunction of thoracic region: Secondary | ICD-10-CM | POA: Diagnosis not present

## 2023-10-16 DIAGNOSIS — M9904 Segmental and somatic dysfunction of sacral region: Secondary | ICD-10-CM | POA: Diagnosis not present

## 2023-10-16 DIAGNOSIS — M9903 Segmental and somatic dysfunction of lumbar region: Secondary | ICD-10-CM | POA: Diagnosis not present

## 2023-10-16 DIAGNOSIS — M9901 Segmental and somatic dysfunction of cervical region: Secondary | ICD-10-CM | POA: Diagnosis not present

## 2023-10-17 DIAGNOSIS — M47816 Spondylosis without myelopathy or radiculopathy, lumbar region: Secondary | ICD-10-CM | POA: Diagnosis not present

## 2023-10-20 DIAGNOSIS — M9903 Segmental and somatic dysfunction of lumbar region: Secondary | ICD-10-CM | POA: Diagnosis not present

## 2023-10-20 DIAGNOSIS — M9901 Segmental and somatic dysfunction of cervical region: Secondary | ICD-10-CM | POA: Diagnosis not present

## 2023-10-20 DIAGNOSIS — M9902 Segmental and somatic dysfunction of thoracic region: Secondary | ICD-10-CM | POA: Diagnosis not present

## 2023-10-20 DIAGNOSIS — M9904 Segmental and somatic dysfunction of sacral region: Secondary | ICD-10-CM | POA: Diagnosis not present

## 2023-10-22 DIAGNOSIS — M25561 Pain in right knee: Secondary | ICD-10-CM | POA: Diagnosis not present

## 2023-10-23 DIAGNOSIS — M9902 Segmental and somatic dysfunction of thoracic region: Secondary | ICD-10-CM | POA: Diagnosis not present

## 2023-10-23 DIAGNOSIS — M9903 Segmental and somatic dysfunction of lumbar region: Secondary | ICD-10-CM | POA: Diagnosis not present

## 2023-10-23 DIAGNOSIS — M9904 Segmental and somatic dysfunction of sacral region: Secondary | ICD-10-CM | POA: Diagnosis not present

## 2023-10-23 DIAGNOSIS — M9901 Segmental and somatic dysfunction of cervical region: Secondary | ICD-10-CM | POA: Diagnosis not present

## 2023-10-24 DIAGNOSIS — M25561 Pain in right knee: Secondary | ICD-10-CM | POA: Diagnosis not present

## 2023-10-28 ENCOUNTER — Other Ambulatory Visit: Payer: Self-pay | Admitting: Orthopaedic Surgery

## 2023-10-28 DIAGNOSIS — M25561 Pain in right knee: Secondary | ICD-10-CM | POA: Diagnosis not present

## 2023-10-28 DIAGNOSIS — M1711 Unilateral primary osteoarthritis, right knee: Secondary | ICD-10-CM | POA: Diagnosis not present

## 2023-10-28 DIAGNOSIS — R9389 Abnormal findings on diagnostic imaging of other specified body structures: Secondary | ICD-10-CM | POA: Diagnosis not present

## 2023-10-29 ENCOUNTER — Telehealth: Payer: Self-pay

## 2023-10-29 ENCOUNTER — Ambulatory Visit: Payer: Medicare Other | Admitting: Pulmonary Disease

## 2023-10-29 ENCOUNTER — Encounter: Payer: Self-pay | Admitting: Pulmonary Disease

## 2023-10-29 ENCOUNTER — Other Ambulatory Visit: Payer: Self-pay | Admitting: Family Medicine

## 2023-10-29 VITALS — BP 146/76 | HR 70 | Temp 97.6°F | Ht 68.0 in | Wt 205.8 lb

## 2023-10-29 DIAGNOSIS — R0602 Shortness of breath: Secondary | ICD-10-CM

## 2023-10-29 DIAGNOSIS — I5032 Chronic diastolic (congestive) heart failure: Secondary | ICD-10-CM

## 2023-10-29 DIAGNOSIS — G4733 Obstructive sleep apnea (adult) (pediatric): Secondary | ICD-10-CM | POA: Diagnosis not present

## 2023-10-29 DIAGNOSIS — R9389 Abnormal findings on diagnostic imaging of other specified body structures: Secondary | ICD-10-CM

## 2023-10-29 NOTE — Telephone Encounter (Signed)
 Patient here today for OV with Dr. Wynona Neat.  Patient has CPAP - DME is [facility].  Called and spoke with Nida Boatman at [employer].  Patient will need to either bring a SD card by our office to see if we can get a download from machine or take CPAP to [facility] location and have them download information directly from the unit.  Informed patient of this.  Gave patient [employer] contact information.  Patient verbalized understanding.

## 2023-10-29 NOTE — Progress Notes (Signed)
 Brittany [name]    [id number]    [birth year]  Primary Care Physician:Sun, Charise Carwin, MD  Referring Physician: Deatra James, MD [phone] [address] [place],  [place] [address]  Chief complaint:   Shortness of breath  HPI:  Patient with shortness of breath with exertion History of obstructive sleep apnea - Continues to use CPAP on a regular basis - We do not have a download from the machine but she claims good compliance and machine is helping  She gets short of breath with mild to moderate activity  Has a history of diastolic heart failure, follows up with heart failure team  Did have a cardiopulmonary exercise test done in the past showing deconditioning as being a big factor  Has had knee surgery, hip surgery and may not have recovered fully, does ambulate with a walker  Recently had a CT scan of the chest showing some interstitial changes but not significant enough to be contributing to shortness of breath  PFT in 2023 did not reveal significant obstruction or restriction  Reformed smoker quit many years ago  Outpatient Encounter Medications as of 10/29/2023  Medication Sig   acetaminophen (TYLENOL) 500 MG tablet Take 500 mg by mouth 2 (two) times daily.   alendronate (FOSAMAX) 70 MG tablet Take 1 tablet (70 mg total) by mouth every 7 (seven) days. Take with a full glass of water on an empty stomach. (Patient taking differently: Take 70 mg by mouth every Wednesday. Take with a full glass of water on an empty stomach.)   apixaban (ELIQUIS) 5 MG TABS tablet Take 1 tablet (5 mg total) by mouth 2 (two) times daily.   atorvastatin (LIPITOR) 40 MG tablet Take 40 mg by mouth in the morning.   B Complex-C (B-COMPLEX WITH VITAMIN C) tablet Take 1 tablet by mouth in the morning.   Cholecalciferol (VITAMIN D3) 125 MCG (5000 UT) TABS Take 5,000 Units by mouth daily with breakfast.   dapagliflozin propanediol (FARXIGA) 10 MG TABS tablet Take 1 tablet (10 mg total) by mouth  daily before breakfast.   digoxin (LANOXIN) 0.125 MG tablet Take 1 tablet (0.125 mg total) by mouth daily.   furosemide (LASIX) 40 MG tablet Take 2 tablets (80 mg total) by mouth every other day. TAKE 80 MG ALTERNATING 40 MG EVERY OTHER DAY (Patient taking differently: Take 40-80 mg by mouth every other day. TAKE 80 MG ALTERNATING 40 MG EVERY OTHER DAY)   levothyroxine (SYNTHROID) 88 MCG tablet Take 88 mcg by mouth daily before breakfast.   Lutein 20 MG TABS Take 20 mg by mouth daily at 12 noon.   magnesium oxide (MAG-OX) 400 MG tablet Take 1 tablet (400 mg total) by mouth daily.   Melatonin 10 MG TABS Take 10-40 mg by mouth at bedtime as needed (sleep).   metoprolol succinate (TOPROL-XL) 50 MG 24 hr tablet Take 2 tablets by  mouth with or following a meal 3 times daily (Patient taking differently: Take 50 mg by mouth in the morning, at noon, and at bedtime.)   Multiple Vitamin (MULTIVITAMIN WITH MINERALS) TABS tablet Take 1 tablet by mouth daily at 12 noon.   Multiple Vitamins-Minerals (PRESERVISION AREDS 2 PO) Take 1 capsule by mouth 2 (two) times daily with a meal.    sertraline (ZOLOFT) 50 MG tablet Take 1 tablet (50 mg total) by mouth daily.   spironolactone (ALDACTONE) 25 MG tablet Take 1 tablet (25 mg total) by mouth daily. (Patient  taking differently: Take 25 mg by mouth daily at 12 noon.)   No facility-administered encounter medications on file as of 10/29/2023.    Allergies as of 10/29/2023 - Review Complete 10/29/2023  Allergen Reaction Noted   Citalopram hydrobromide Other (See Comments) 08/20/2021   Adhesive [tape] Swelling and Rash 02/23/2013   Bacitracin-polymyxin b Rash 08/20/2021   Benzalkonium chloride Rash 11/22/2015   Neosporin [neomycin-polymyxin-gramicidin] Swelling and Rash 02/23/2013    Past Medical History:  Diagnosis Date   Ankle fracture 2016   Arthritis    Atrial fibrillation (HCC)    Depression    Dysrhythmia    Elevated parathyroid hormone    Fracture of  orbital floor with routine healing    GERD (gastroesophageal reflux disease)    History of colon polyps    benign   Hypercalcemia    Hypertension    Hyponatremia    Hypothyroid    Macular degeneration    wet in the right and dry in the left   OSA on CPAP    Osteoporosis    Peripheral vascular disease (HCC)    Presence of permanent cardiac pacemaker    PSVT (paroxysmal supraventricular tachycardia) (HCC)    Rotator cuff tear    Sinus node dysfunction (HCC)    a. s/p MDT pacemaker   Stroke (HCC)    Urinary incontinence    Wrist fracture 2018    Past Surgical History:  Procedure Laterality Date   BACK SURGERY     CARDIOVERSION N/A 05/02/2022   Procedure: CARDIOVERSION;  Surgeon: Chrystie Nose, MD;  Location: [facility];  Service: Cardiovascular;  Laterality: N/A;   CATARACT EXTRACTION     CATARACT EXTRACTION W/ INTRAOCULAR LENS  IMPLANT, BILATERAL Bilateral    COLONOSCOPY     ESOPHAGOGASTRODUODENOSCOPY     IR ANGIO INTRA EXTRACRAN SEL COM CAROTID INNOMINATE BILAT MOD SED  12/30/2017   IR ANGIO VERTEBRAL SEL VERTEBRAL BILAT MOD SED  12/30/2017   LAPAROSCOPIC CHOLECYSTECTOMY  08/11/1999   LUMBAR DISC SURGERY  02/19/2014   ORIF ANKLE FRACTURE Left 04/13/2015   Procedure: OPEN REDUCTION INTERNAL FIXATION (ORIF) LEFT ANKLE FRACTURE;  Surgeon: Cammy Copa, MD;  Location: [facility] OR;  Service: Orthopedics;  Laterality: Left;   PACEMAKER PLACEMENT Right 07/22/2010   a. MDT dual chamber PPM implanted by Dr Graciela Husbands    [facility] GENERATOR CHANGEOUT N/A 08/22/2022   Procedure: PPM GENERATOR Janeann Merl;  Surgeon: Duke Salvia, MD;  Location: [facility] INVASIVE CV LAB;  Service: Cardiovascular;  Laterality: N/A;   SHOULDER ARTHROSCOPY W/ ROTATOR CUFF REPAIR Right 08/30/2014   WITH MINI-OPEN ROTATOR CUFF REPAIR AND SUBACROMIAL DECOMPRESSION   SHOULDER ARTHROSCOPY WITH ROTATOR CUFF REPAIR AND SUBACROMIAL DECOMPRESSION Right 08/30/2014   Procedure: SHOULDER ARTHROSCOPY WITH MINI-OPEN ROTATOR CUFF  REPAIR AND SUBACROMIAL DECOMPRESSION;  Surgeon: Cammy Copa, MD;  Location[facility] OR;  Service: Orthopedics;  Laterality: Right;  RIGHT SHOULDER DIAGNOSTIC OPERATIVE ARTHROSCOPY, SUBACROMIAL DECOMPRESSION, MINI-OPEN ROTATOR CUFF REPAIR.   TOTAL HIP ARTHROPLASTY Right 08/14/2021   Procedure: TOTAL HIP ARTHROPLASTY ANTERIOR APPROACH;  Surgeon: Marcene Corning, MD;  Location: [facility] ORS;  Service: Orthopedics;  Laterality: Right;   TOTAL KNEE ARTHROPLASTY Left 02/13/2021   Procedure: LEFT TOTAL KNEE ARTHROPLASTY;  Surgeon: Marcene Corning, MD;  Location: [facility] ORS;  Service: Orthopedics;  Laterality: Left;   VAGINAL HYSTERECTOMY  07/22/1968   WRIST FRACTURE SURGERY Left     Family History  Problem Relation Age of Onset   Colon cancer Mother  colon   Cancer Father        unknown type   Stroke Sister    Skin cancer Brother    Heart disease Brother    Breast cancer Paternal Aunt    Asthma Brother     Social History   Socioeconomic History   Marital status: Married    Spouse name: Not on file   Number of children: 4   Years of education: 12   Highest education level: Not on file  Occupational History   Occupation: Office manager     Comment: retired  Tobacco Use   Smoking status: Former    Current packs/day: 0.00    Average packs/day: 1 pack/day for 45.0 years (45.0 ttl pk-yrs)    Types: Cigarettes    Start date: 62    Quit date: 1992    Years since quitting: 33.2   Smokeless tobacco: Never   Tobacco comments:    quit smoking in 1992  Vaping Use   Vaping status: Never Used  Substance and Sexual Activity   Alcohol use: Yes    Alcohol/week: 28.0 standard drinks of alcohol    Types: 28 Glasses of wine per week    Comment: "16oz wine at dinner time"   Drug use: No   Sexual activity: Yes    Birth control/protection: Surgical  Other Topics Concern   Not on file  Social History Narrative   Ms. Mormino lives with her husband at [employer]. They are retired and  recently relocated to [facility] from [place]. She has 4 grown children.    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         Right handed   Wear glasses    Drinks 2 cups coffee in am   1 diet coke a day and 1 sprite zero per day    Social Drivers of Health   Financial Resource Strain: Not on file  Food Insecurity: No Food Insecurity (12/02/2022)   Hunger Vital Sign    Worried About Running Out of Food in the Last Year: Never true    Ran Out of Food in the Last Year: Never true  Transportation Needs: No Transportation Needs (12/02/2022)   PRAPARE - Administrator, Civil Service (Medical): No    Lack of Transportation (Non-Medical): No  Physical Activity: Not on file  Stress: Not on file  Social Connections: Not on file  Intimate Partner Violence: Not At Risk (12/02/2022)   Humiliation, Afraid, Rape, and Kick questionnaire    Fear of Current or Ex-Partner: No    Emotionally Abused: No    Physically Abused: No    Sexually Abused: No    Review of Systems  Respiratory:  Positive for apnea and shortness of breath.   Psychiatric/Behavioral:  Positive for sleep disturbance.     Vitals:   10/29/23 1434  BP: (!) 146/76  Pulse: 70  Temp: 97.6 F (36.4 C)  SpO2: 95%     Physical Exam Constitutional:      Appearance: She is obese.  HENT:     Head: Normocephalic.     Mouth/Throat:     Mouth: Mucous membranes are moist.  Eyes:     General: No scleral icterus. Cardiovascular:     Rate and Rhythm: Normal rate and regular rhythm.     Heart sounds: No murmur heard.    No friction rub.  Pulmonary:     Effort: No respiratory distress.  Breath sounds: No stridor. No wheezing or rhonchi.  Musculoskeletal:     Cervical back: No rigidity or tenderness.  Neurological:     Mental Status: She is alert.  Psychiatric:        Mood and Affect: Mood normal.    Data Reviewed: Echocardiogram 12/02/2022-normal ejection fraction, biatrial  dilatation  PFT 11/06/2021-no obstruction, no restriction, no significant bronchodilator response  CT chest 09/12/2023-some groundglass changes, some calcified granuloma, some interstitial changes  Had had a previous CPET showing deconditioning has been contributory to her limitations  Assessment:  Shortness of breath on exertion -Likely multifactorial - Heart failure, deconditioning, at most may have mild obstructive disease  Obstructive sleep apnea - We do not have a download from the machine but patient claims good compliance and benefit   Plan/Recommendations: Continue CPAP on a nightly basis - We will attempt to get a download from the machine by calling [employer]  Graded activities as tolerated  Will be having knee surgery in the near future  Continue follow-up with the heart failure team and continue diuretics  Follow-up in about 4 months  We will try and get a download from the machine   Virl Diamond MD [facility] 10/29/2023, 2:45 PM  CC: Deatra James, MD

## 2023-10-29 NOTE — Patient Instructions (Signed)
 We will contact [employer] to try and get a download from the machine  Kindly contact [employer] on your end as well, for them to make sure the machine is working well  Continue using your CPAP on a nightly basis  Shortness of breath is likely multifactorial with some degree of deconditioning playing a significant role, there is some component of lung dysfunction, cardiac dysfunction  Hopefully your knee surgery will heal without pain or discomfort and can allow you to focus on rehabilitation  Follow-up in about 4 months  Call us with significant concerns

## 2023-10-30 DIAGNOSIS — M9901 Segmental and somatic dysfunction of cervical region: Secondary | ICD-10-CM | POA: Diagnosis not present

## 2023-10-30 DIAGNOSIS — M9903 Segmental and somatic dysfunction of lumbar region: Secondary | ICD-10-CM | POA: Diagnosis not present

## 2023-10-30 DIAGNOSIS — M47816 Spondylosis without myelopathy or radiculopathy, lumbar region: Secondary | ICD-10-CM | POA: Diagnosis not present

## 2023-10-30 DIAGNOSIS — M9902 Segmental and somatic dysfunction of thoracic region: Secondary | ICD-10-CM | POA: Diagnosis not present

## 2023-10-30 DIAGNOSIS — M9904 Segmental and somatic dysfunction of sacral region: Secondary | ICD-10-CM | POA: Diagnosis not present

## 2023-10-31 DIAGNOSIS — M25561 Pain in right knee: Secondary | ICD-10-CM | POA: Diagnosis not present

## 2023-10-31 NOTE — Progress Notes (Addendum)
 COVID Vaccine Completed: yes  Date of COVID positive in last 90 days: no  PCP - Vyvyan Sun, MD Cardiologist - Richardo Chandler, MD  Cardiac clearance by Arta Lark, MD 09/24/23 in Epic  Chest x-ray -  CT 09/12/23 Epic EKG - 09/24/23 Epic Stress Test - 08/01/23 Epic ECHO - 12/02/22 Epic Cardiac Cath - n/a Pacemaker/ICD device last checked: 09/11/23 Epic orders in Epic dated 10/02/23 Spinal Cord Stimulator:n/a  Bowel Prep - no  Sleep Study - yes CPAP -  every night per pt  Fasting Blood Sugar - n/a Checks Blood Sugar _____ times a day  Last dose of GLP1 agonist-  N/A GLP1 instructions:  Hold 7 days before surgery    Last dose of SGLT-2 inhibitors-  N/A SGLT-2 instructions:  Hold 3 days before surgery    Blood Thinner Instructions: Eliquis, hold 3 days Aspirin Instructions: Last Dose: 11/07/23 1700  Activity level: Can go up a flight of stairs and perform activities of daily living without stopping and without symptoms of chest pain. SOB with activity per pt, not new  Anesthesia review: HTN, a fib, stroke, CHF, OSA, SDH, syncope, PSVT, pacemaker  Patient denies shortness of breath, fever, cough and chest pain at PAT appointment  Patient verbalized understanding of instructions that were given to them at the PAT appointment. Patient was also instructed that they will need to review over the PAT instructions again at home before surgery.

## 2023-10-31 NOTE — Care Plan (Signed)
 Ortho Bundle Case Management Note  Patient Details  Name: Brittany [name] MRN: [id number] Date of Birth: 11/17/1933   spoke with patient and husband. they live at [place]. She will be going to the rehab center prior to returning to her apartment with her husband. all therapy will be done at [facility]. discharge instructions discussed and mailed to patient. Patient and MD in agreement with plan                   DME Arranged:    DME Agency:     HH Arranged:    HH Agency:     Additional Comments: Please contact me with any questions of if this plan should need to change.  Shauna Hugh,  RN,BSN,MHA,CCM  [facility]  [phone] 10/31/2023, 4:15 PM

## 2023-11-03 ENCOUNTER — Other Ambulatory Visit: Payer: Self-pay

## 2023-11-03 ENCOUNTER — Encounter (HOSPITAL_COMMUNITY)
Admission: RE | Admit: 2023-11-03 | Discharge: 2023-11-03 | Disposition: A | Discharge status: Home / Self Care | Source: Ambulatory Visit | Attending: Orthopaedic Surgery | Admitting: Orthopaedic Surgery

## 2023-11-03 ENCOUNTER — Encounter (HOSPITAL_COMMUNITY): Payer: Self-pay

## 2023-11-03 VITALS — BP 146/71 | HR 65 | Temp 97.9°F | Resp 18 | Ht 68.0 in | Wt 210.2 lb

## 2023-11-03 DIAGNOSIS — M1711 Unilateral primary osteoarthritis, right knee: Secondary | ICD-10-CM | POA: Diagnosis not present

## 2023-11-03 DIAGNOSIS — Z01818 Encounter for other preprocedural examination: Secondary | ICD-10-CM | POA: Diagnosis present

## 2023-11-03 DIAGNOSIS — Z7901 Long term (current) use of anticoagulants: Secondary | ICD-10-CM | POA: Insufficient documentation

## 2023-11-03 DIAGNOSIS — I10 Essential (primary) hypertension: Secondary | ICD-10-CM | POA: Insufficient documentation

## 2023-11-03 DIAGNOSIS — Z8673 Personal history of transient ischemic attack (TIA), and cerebral infarction without residual deficits: Secondary | ICD-10-CM | POA: Insufficient documentation

## 2023-11-03 DIAGNOSIS — G4733 Obstructive sleep apnea (adult) (pediatric): Secondary | ICD-10-CM | POA: Diagnosis not present

## 2023-11-03 DIAGNOSIS — Z01812 Encounter for preprocedural laboratory examination: Secondary | ICD-10-CM | POA: Insufficient documentation

## 2023-11-03 DIAGNOSIS — Z950 Presence of cardiac pacemaker: Secondary | ICD-10-CM | POA: Insufficient documentation

## 2023-11-03 DIAGNOSIS — I4891 Unspecified atrial fibrillation: Secondary | ICD-10-CM | POA: Insufficient documentation

## 2023-11-03 LAB — CBC
HCT: 42.4 % (ref 36.0–46.0)
Hemoglobin: 14.2 g/dL (ref 12.0–15.0)
MCH: 33.1 pg (ref 26.0–34.0)
MCHC: 33.5 g/dL (ref 30.0–36.0)
MCV: 98.8 fL (ref 80.0–100.0)
Platelets: 355 10*3/uL (ref 150–400)
RBC: 4.29 MIL/uL (ref 3.87–5.11)
RDW: 14.7 % (ref 11.5–15.5)
WBC: 8.3 10*3/uL (ref 4.0–10.5)
nRBC: 0.2 % (ref 0.0–0.2)

## 2023-11-03 LAB — SURGICAL PCR SCREEN
MRSA, PCR: NEGATIVE
Staphylococcus aureus: NEGATIVE

## 2023-11-03 LAB — BASIC METABOLIC PANEL WITH GFR
Anion gap: 10 (ref 5–15)
BUN: 12 mg/dL (ref 8–23)
CO2: 27 mmol/L (ref 22–32)
Calcium: 10 mg/dL (ref 8.9–10.3)
Chloride: 96 mmol/L — ABNORMAL LOW (ref 98–111)
Creatinine, Ser: 0.86 mg/dL (ref 0.44–1.00)
GFR, Estimated: >60 mL/min (ref >60)
Glucose, Bld: 144 mg/dL — ABNORMAL HIGH (ref 70–99)
Potassium: 5.3 mmol/L — ABNORMAL HIGH (ref 3.5–5.1)
Sodium: 133 mmol/L — ABNORMAL LOW (ref 135–145)

## 2023-11-03 NOTE — Patient Instructions (Signed)
 SURGICAL WAITING ROOM VISITATION  Patients having surgery or a procedure may have no more than 2 support people in the waiting area - these visitors may rotate.    Children under the age of 77 must have an adult with them who is not the patient.  Due to an increase in RSV and influenza rates and associated hospitalizations, children ages 13 and under may not visit patients in [facility] hospitals.  Visitors with respiratory illnesses are discouraged from visiting and should remain at home.  If the patient needs to stay at the hospital during part of their recovery, the visitor guidelines for inpatient rooms apply. Pre-op nurse will coordinate an appropriate time for 1 support person to accompany patient in pre-op.  This support person may not rotate.    Please refer to the [facility] website for the visitor guidelines for Inpatients (after your surgery is over and you are in a regular room).    Your procedure is scheduled on: 11/11/23   Report to [facility] Main Entrance    Report to admitting at 5:15 AM   Call this number if you have problems the morning of surgery [phone]   Do not eat food :After Midnight.   After Midnight you may have the following liquids until 4:30 AM DAY OF SURGERY  Water Non-Citrus Juices (without pulp, NO RED-Apple, White grape, White cranberry) Black Coffee (NO MILK/CREAM OR CREAMERS, sugar ok)  Clear Tea (NO MILK/CREAM OR CREAMERS, sugar ok) regular and decaf                             Plain Jell-O (NO RED)                                           Fruit ices (not with fruit pulp, NO RED)                                     Popsicles (NO RED)                                                               Sports drinks like Gatorade (NO RED)    The day of surgery:  Drink ONE (1) Pre-Surgery Clear Ensure at 4:30 AM the morning of surgery. Drink in one sitting. Do not sip.  This drink was given to you during your hospital  pre-op  appointment visit. Nothing else to drink after completing the  Pre-Surgery Clear Ensure.          If you have questions, please contact your surgeon's office.   FOLLOW BOWEL PREP AND ANY ADDITIONAL PRE OP INSTRUCTIONS YOU RECEIVED FROM YOUR SURGEON'S OFFICE!!!     Oral Hygiene is also important to reduce your risk of infection.                                    Remember - BRUSH YOUR TEETH THE MORNING OF SURGERY WITH YOUR REGULAR TOOTHPASTE  DENTURES  WILL BE REMOVED PRIOR TO SURGERY PLEASE DO NOT APPLY "Poly grip" OR ADHESIVES!!!   Stop all vitamins and herbal supplements 7 days before surgery.   Take these medicines the morning of surgery with A SIP OF WATER: Tylenol, Atorvastatin, Levothyroxine, Metoprolol, Sertraline   These are anesthesia recommendations for holding your anticoagulants.  Please contact your prescribing physician to confirm IF it is safe to hold your anticoagulants for this length of time.   Eliquis Apixaban   72 hours   Xarelto Rivaroxaban   72 hours  Plavix Clopidogrel   120 hours  Pletal Cilostazol   120 hours    Bring CPAP mask and tubing day of surgery.                              You may not have any metal on your body including hair pins, jewelry, and body piercing             Do not wear make-up, lotions, powders, perfumes, or deodorant  Do not wear nail polish including gel and S&S, artificial/acrylic nails, or any other type of covering on natural nails including finger and toenails. If you have artificial nails, gel coating, etc. that needs to be removed by a nail salon please have this removed prior to surgery or surgery may need to be canceled/ delayed if the surgeon/ anesthesia feels like they are unable to be safely monitored.   Do not shave  48 hours prior to surgery.    Do not bring valuables to the hospital. [facility] IS NOT             RESPONSIBLE   FOR VALUABLES.   Contacts, glasses, dentures or bridgework may not be worn into  surgery.  DO NOT BRING YOUR HOME MEDICATIONS TO THE HOSPITAL. PHARMACY WILL DISPENSE MEDICATIONS LISTED ON YOUR MEDICATION LIST TO YOU DURING YOUR ADMISSION IN THE HOSPITAL!    Patients discharged on the day of surgery will not be allowed to drive home.  Someone NEEDS to stay with you for the first 24 hours after anesthesia.              Please read over the following fact sheets you were given: IF YOU HAVE QUESTIONS ABOUT YOUR PRE-OP INSTRUCTIONS PLEASE CALL [phone] [place]ras    If you received a COVID test during your pre-op visit  it is requested that you wear a mask when out in public, stay away from anyone that may not be feeling well and notify your surgeon if you develop symptoms. If you test positive for Covid or have been in contact with anyone that has tested positive in the last 10 days please notify you surgeon.      Pre-operative 5 CHG Bath Instructions   You can play a key role in reducing the risk of infection after surgery. Your skin needs to be as free of germs as possible. You can reduce the number of germs on your skin by washing with CHG (chlorhexidine gluconate) soap before surgery. CHG is an antiseptic soap that kills germs and continues to kill germs even after washing.   DO NOT use if you have an allergy to chlorhexidine/CHG or antibacterial soaps. If your skin becomes reddened or irritated, stop using the CHG and notify one of our RNs at [phone].   Please shower with the CHG soap starting 4 days before surgery using the following schedule:     Please  keep in mind the following:  DO NOT shave, including legs and underarms, starting the day of your first shower.   You may shave your face at any point before/day of surgery.  Place clean sheets on your bed the day you start using CHG soap. Use a clean washcloth (not used since being washed) for each shower. DO NOT sleep with pets once you start using the CHG.   CHG Shower Instructions:  If you choose to  wash your hair and private area, wash first with your normal shampoo/soap.  After you use shampoo/soap, rinse your hair and body thoroughly to remove shampoo/soap residue.  Turn the water OFF and apply about 3 tablespoons (45 ml) of CHG soap to a CLEAN washcloth.  Apply CHG soap ONLY FROM YOUR NECK DOWN TO YOUR TOES (washing for 3-5 minutes)  DO NOT use CHG soap on face, private areas, open wounds, or sores.  Pay special attention to the area where your surgery is being performed.  If you are having back surgery, having someone wash your back for you may be helpful. Wait 2 minutes after CHG soap is applied, then you may rinse off the CHG soap.  Pat dry with a clean towel  Put on clean clothes/pajamas   If you choose to wear lotion, please use ONLY the CHG-compatible lotions on the back of this paper.     Additional instructions for the day of surgery: DO NOT APPLY any lotions, deodorants, cologne, or perfumes.   Put on clean/comfortable clothes.  Brush your teeth.  Ask your nurse before applying any prescription medications to the skin.      CHG Compatible Lotions   Aveeno Moisturizing lotion  Cetaphil Moisturizing Cream  Cetaphil Moisturizing Lotion  Clairol Herbal Essence Moisturizing Lotion, Dry Skin  Clairol Herbal Essence Moisturizing Lotion, Extra Dry Skin  Clairol Herbal Essence Moisturizing Lotion, Normal Skin  Curel Age Defying Therapeutic Moisturizing Lotion with Alpha Hydroxy  Curel Extreme Care Body Lotion  Curel Soothing Hands Moisturizing Hand Lotion  Curel Therapeutic Moisturizing Cream, Fragrance-Free  Curel Therapeutic Moisturizing Lotion, Fragrance-Free  Curel Therapeutic Moisturizing Lotion, Original Formula  Eucerin Daily Replenishing Lotion  Eucerin Dry Skin Therapy Plus Alpha Hydroxy Crme  Eucerin Dry Skin Therapy Plus Alpha Hydroxy Lotion  Eucerin Original Crme  Eucerin Original Lotion  Eucerin Plus Crme Eucerin Plus Lotion  Eucerin TriLipid  Replenishing Lotion  Keri Anti-Bacterial Hand Lotion  Keri Deep Conditioning Original Lotion Dry Skin Formula Softly Scented  Keri Deep Conditioning Original Lotion, Fragrance Free Sensitive Skin Formula  Keri Lotion Fast Absorbing Fragrance Free Sensitive Skin Formula  Keri Lotion Fast Absorbing Softly Scented Dry Skin Formula  Keri Original Lotion  Keri Skin Renewal Lotion Keri Silky Smooth Lotion  Keri Silky Smooth Sensitive Skin Lotion  Nivea Body Creamy Conditioning Oil  Nivea Body Extra Enriched Lotion  Nivea Body Original Lotion  Nivea Body Sheer Moisturizing Lotion Nivea Crme  Nivea Skin Firming Lotion  NutraDerm 30 Skin Lotion  NutraDerm Skin Lotion  NutraDerm Therapeutic Skin Cream  NutraDerm Therapeutic Skin Lotion  ProShield Protective Hand Cream  Provon moisturizing lotion   Incentive Spirometer  An incentive spirometer is a tool that can help keep your lungs clear and active. This tool measures how well you are filling your lungs with each breath. Taking long deep breaths may help reverse or decrease the chance of developing breathing (pulmonary) problems (especially infection) following: A long period of time when you are unable to move or be active.  BEFORE THE PROCEDURE  If the spirometer includes an indicator to show your best effort, your nurse or respiratory therapist will set it to a desired goal. If possible, sit up straight or lean slightly forward. Try not to slouch. Hold the incentive spirometer in an upright position. INSTRUCTIONS FOR USE  Sit on the edge of your bed if possible, or sit up as far as you can in bed or on a chair. Hold the incentive spirometer in an upright position. Breathe out normally. Place the mouthpiece in your mouth and seal your lips tightly around it. Breathe in slowly and as deeply as possible, raising the piston or the ball toward the top of the column. Hold your breath for 3-5 seconds or for as long as possible. Allow the piston  or ball to fall to the bottom of the column. Remove the mouthpiece from your mouth and breathe out normally. Rest for a few seconds and repeat Steps 1 through 7 at least 10 times every 1-2 hours when you are awake. Take your time and take a few normal breaths between deep breaths. The spirometer may include an indicator to show your best effort. Use the indicator as a goal to work toward during each repetition. After each set of 10 deep breaths, practice coughing to be sure your lungs are clear. If you have an incision (the cut made at the time of surgery), support your incision when coughing by placing a pillow or rolled up towels firmly against it. Once you are able to get out of bed, walk around indoors and cough well. You may stop using the incentive spirometer when instructed by your caregiver.  RISKS AND COMPLICATIONS Take your time so you do not get dizzy or light-headed. If you are in pain, you may need to take or ask for pain medication before doing incentive spirometry. It is harder to take a deep breath if you are having pain. AFTER USE Rest and breathe slowly and easily. It can be helpful to keep track of a log of your progress. Your caregiver can provide you with a simple table to help with this. If you are using the spirometer at home, follow these instructions: SEEK MEDICAL CARE IF:  You are having difficultly using the spirometer. You have trouble using the spirometer as often as instructed. Your pain medication is not giving enough relief while using the spirometer. You develop fever of 100.5 F (38.1 C) or higher. SEEK IMMEDIATE MEDICAL CARE IF:  You cough up bloody sputum that had not been present before. You develop fever of 102 F (38.9 C) or greater. You develop worsening pain at or near the incision site. MAKE SURE YOU:  Understand these instructions. Will watch your condition. Will get help right away if you are not doing well or get worse. Document Released:  11/18/2006 Document Revised: 09/30/2011 Document Reviewed: 01/19/2007 [facility] Patient Information 2014 [place], [place].   ________________________________________________________________________

## 2023-11-04 DIAGNOSIS — M25561 Pain in right knee: Secondary | ICD-10-CM | POA: Diagnosis not present

## 2023-11-04 NOTE — Progress Notes (Signed)
 Anesthesia Chart Review   Case: [id number] Date/Time: 11/11/23 0715   Procedure: ARTHROPLASTY, KNEE, TOTAL (Right: Knee) - RIGHT KNEE ARTHROPLASTY   Anesthesia type: Spinal   Diagnosis: Primary osteoarthritis of right knee [M17.11]   Pre-op diagnosis: right knee degenerative joint disease   Location: [facility] ROOM 06 / [facility]   Surgeons: Marcene Corning, MD       DISCUSSION:89 y.o. former smoker with h/o HTN, OSA on CPAP, atrial fibrillation, stroke,  pacemaker in place (device orders in 10/02/2023 progress note), right knee djd scheduled for above procedure 11/11/2023 with Dr. Marcene Corning.   Per cardiology preoperative evaluation 10/03/2023, "Chart reviewed as part of pre-operative protocol coverage. Given past medical history and time since last visit, based on ACC/AHA guidelines, Brittany [name] is at acceptable risk for the planned procedure without further cardiovascular testing.  Patient was seen in the office on 09/24/2023 by Dr. Elwyn Lade and was cleared for surgery.   Patient previously approved by Dr. Graciela Husbands to hold Eliquis 3 days prior to procedure.  Patient may hold Eliquis for 3 days prior to procedure. Please resume Eliquis as soon as possible postprocedure, at the discretion of the surgeon. "  Last dose of Eliquis 11/07/2023 PM dose.  VS: BP (!) 146/71   Pulse 65   Temp 36.6 C (Oral)   Resp 18   Ht 5\' 8"  (1.727 m)   Wt 95.4 kg   LMP 07/22/1968   SpO2 96%   BMI 31.96 kg/m   PROVIDERS: Deatra James, MD is PCP   Cardiologist - Sherryl Manges, MD   Virl Diamond, MD is Pulmonologist  LABS: Labs reviewed: Acceptable for surgery. (all labs ordered are listed, but only abnormal results are displayed)  Labs Reviewed  BASIC METABOLIC PANEL WITH GFR - Abnormal; Notable for the following components:      Result Value   Sodium 133 (*)    Potassium 5.3 (*)    Chloride 96 (*)    Glucose, Bld 144 (*)    All other components within normal limits  SURGICAL PCR SCREEN  CBC      IMAGES: VAS US Carotid 06/06/2023 Summary:  Right Carotid: Velocities in the right ICA are consistent with a 60-79%                 stenosis.   Left Carotid: Velocities in the left ICA are consistent with a 40-59%  stenosis.   EKG:   CV: Echo 12/02/2022 1. Left ventricular ejection fraction, by estimation, is 60 to 65%. The  left ventricle has normal function. The left ventricle has no regional  wall motion abnormalities. There is mild left ventricular hypertrophy of  the basal-septal segment. Left  ventricular diastolic function could not be evaluated.   2. Right ventricular systolic function is normal. The right ventricular  size is normal. There is normal pulmonary artery systolic pressure.   3. Left atrial size was moderately dilated.   4. Right atrial size was moderately dilated.   5. The mitral valve is normal in structure. Mild mitral valve  regurgitation. No evidence of mitral stenosis.   6. The aortic valve is tricuspid. Aortic valve regurgitation is mild.  Aortic valve sclerosis/calcification is present, without any evidence of  aortic stenosis.   7. The inferior vena cava is dilated in size with >50% respiratory  variability, suggesting right atrial pressure of 8 mmHg.  Past Medical History:  Diagnosis Date   Ankle fracture 2016   Arthritis    Atrial fibrillation (  HCC)    Depression    Dysrhythmia    Elevated parathyroid hormone    Fracture of orbital floor with routine healing    GERD (gastroesophageal reflux disease)    History of colon polyps    benign   Hypercalcemia    Hypertension    Hyponatremia    Hypothyroid    Macular degeneration    wet in the right and dry in the left   OSA on CPAP    Osteoporosis    Peripheral vascular disease (HCC)    Presence of permanent cardiac pacemaker    PSVT (paroxysmal supraventricular tachycardia) (HCC)    Rotator cuff tear    Sinus node dysfunction (HCC)    a. s/p MDT pacemaker   Stroke [facility])     Urinary incontinence    Wrist fracture 2018    Past Surgical History:  Procedure Laterality Date   BACK SURGERY     CARDIOVERSION N/A 05/02/2022   Procedure: CARDIOVERSION;  Surgeon: Hazle Lites, MD;  Location: [facility]OS[facility];  Service: Cardiovascular;  Laterality: N/A;   CATARACT EXTRACTION     CATARACT EXTRACTION W/ INTRAOCULAR LENS  IMPLANT, BILATERAL Bilateral    COLONOSCOPY     ESOPHAGOGASTRODUODENOSCOPY     IR ANGIO INTRA EXTRACRAN SEL COM CAROTID INNOMINATE BILAT MOD SED  12/30/2017   IR ANGIO VERTEBRAL SEL VERTEBRAL BILAT MOD SED  12/30/2017   LAPAROSCOPIC CHOLECYSTECTOMY  08/11/1999   LUMBAR DISC SURGERY  02/19/2014   ORIF ANKLE FRACTURE Left 04/13/2015   Procedure: OPEN REDUCTION INTERNAL FIXATION (ORIF) LEFT ANKLE FRACTURE;  Surgeon: Jasmine Mesi, MD;  Location: [facility] OR;  Service: Orthopedics;  Laterality: Left;   PACEMAKER PLACEMENT Right 07/22/2010   a. MDT dual chamber PPM implanted by Dr Rodolfo Clan    [facility] GENERATOR CHANGEOUT N/A 08/22/2022   Procedure: PPM GENERATOR Alpheus Jarvis;  Surgeon: Verona Goodwill, MD;  Location: [facility] INVASIVE CV LAB;  Service: Cardiovascular;  Laterality: N/A;   SHOULDER ARTHROSCOPY W/ ROTATOR CUFF REPAIR Right 08/30/2014   WITH MINI-OPEN ROTATOR CUFF REPAIR AND SUBACROMIAL DECOMPRESSION   SHOULDER ARTHROSCOPY WITH ROTATOR CUFF REPAIR AND SUBACROMIAL DECOMPRESSION Right 08/30/2014   Procedure: SHOULDER ARTHROSCOPY WITH MINI-OPEN ROTATOR CUFF REPAIR AND SUBACROMIAL DECOMPRESSION;  Surgeon: Jasmine Mesi, MD;  Location: [facility] OR;  Service: Orthopedics;  Laterality: Right;  RIGHT SHOULDER DIAGNOSTIC OPERATIVE ARTHROSCOPY, SUBACROMIAL DECOMPRESSION, MINI-OPEN ROTATOR CUFF REPAIR.   TOTAL HIP ARTHROPLASTY Right 08/14/2021   Procedure: TOTAL HIP ARTHROPLASTY ANTERIOR APPROACH;  Surgeon: Dayne Even, MD;  Location[facility];  Service: Orthopedics;  Laterality: Right;   TOTAL KNEE ARTHROPLASTY Left 02/13/2021   Procedure: LEFT TOTAL KNEE ARTHROPLASTY;   Surgeon: Dayne Even, MD;  Location: [facility];  Service: Orthopedics;  Laterality: Left;   VAGINAL HYSTERECTOMY  07/22/1968   WRIST FRACTURE SURGERY Left     MEDICATIONS:  acetaminophen (TYLENOL) 500 MG tablet   alendronate (FOSAMAX) 70 MG tablet   apixaban (ELIQUIS) 5 MG TABS tablet   atorvastatin (LIPITOR) 40 MG tablet   B Complex-C (B-COMPLEX WITH VITAMIN C) tablet   Cholecalciferol (VITAMIN D3) 125 MCG (5000 UT) TABS   dapagliflozin propanediol (FARXIGA) 10 MG TABS tablet   digoxin (LANOXIN) 0.125 MG tablet   furosemide (LASIX) 40 MG tablet   levothyroxine (SYNTHROID) 88 MCG tablet   Lutein 20 MG TABS   magnesium oxide (MAG-OX) 400 MG tablet   Melatonin 10 MG TABS   metoprolol succinate (TOPROL-XL) 50 MG 24 hr tablet   Multiple Vitamin (MULTIVITAMIN WITH MINERALS)  TABS tablet   Multiple Vitamins-Minerals (PRESERVISION AREDS 2 PO)   sertraline (ZOLOFT) 50 MG tablet   spironolactone (ALDACTONE) 25 MG tablet   No current facility-administered medications for this encounter.     Chick Cotton Ward, PA-C [facility]-S[facility] [phone]

## 2023-11-04 NOTE — Anesthesia Preprocedure Evaluation (Addendum)
 Anesthesia Evaluation  Patient identified by MRN, date of birth, ID band Patient awake    Reviewed: Allergy & Precautions, NPO status , Patient's Chart, lab work & pertinent test results  History of Anesthesia Complications Negative for: history of anesthetic complications  Airway Mallampati: II  TM Distance: >3 FB Neck ROM: Full    Dental no notable dental hx. (+) Teeth Intact, Dental Advisory Given   Pulmonary sleep apnea and Continuous Positive Airway Pressure Ventilation , former smoker   Pulmonary exam normal breath sounds clear to auscultation       Cardiovascular hypertension, (-) angina + CAD  (-) Past MI Normal cardiovascular exam+ dysrhythmias (on eliquis ) Atrial Fibrillation + pacemaker  Rhythm:Regular Rate:Normal  11/2022 Echo 1. Left ventricular ejection fraction, by estimation, is 60 to 65%. The  left ventricle has normal function. The left ventricle has no regional  wall motion abnormalities. There is mild left ventricular hypertrophy of  the basal-septal segment. Left  ventricular diastolic function could not be evaluated.   2. Right ventricular systolic function is normal. The right ventricular  size is normal. There is normal pulmonary artery systolic pressure.   3. Left atrial size was moderately dilated.   4. Right atrial size was moderately dilated.   5. The mitral valve is normal in structure. Mild mitral valve  regurgitation. No evidence of mitral stenosis.   6. The aortic valve is tricuspid. Aortic valve regurgitation is mild.  Aortic valve sclerosis/calcification is present, without any evidence of  aortic stenosis.   7. The inferior vena cava is dilated in size with >50% respiratory  variability, suggesting right atrial pressure of 8 mmHg.     Neuro/Psych  PSYCHIATRIC DISORDERS Anxiety Depression    CVA    GI/Hepatic   Endo/Other  Hypothyroidism    Renal/GU Lab Results      Component                 Value               Date                              K                        5.3 (H)             11/03/2023                CREATININE               0.86                11/03/2023                GFRNONAA                 >60                 11/03/2023                Bladder dysfunction      Musculoskeletal  (+) Arthritis ,    Abdominal   Peds  Hematology Lab Results      Component                Value               Date  WBC                      8.3                 11/03/2023                HGB                      14.2                11/03/2023                HCT                      42.4                11/03/2023                MCV                      98.8                11/03/2023                PLT                      355                 11/03/2023              Anesthesia Other Findings All: Citalopram ,   Reproductive/Obstetrics                             Anesthesia Physical Anesthesia Plan  ASA: 3  Anesthesia Plan: Spinal and Regional   Post-op Pain Management: Regional block*, Minimal or no pain anticipated, Ofirmev  IV (intra-op)* and Precedex    Induction:   PONV Risk Score and Plan: 2 and Treatment may vary due to age or medical condition and Ondansetron   Airway Management Planned: Natural Airway and Nasal Cannula  Additional Equipment: None  Intra-op Plan:   Post-operative Plan: Extubation in OR  Informed Consent: I have reviewed the patients History and Physical, chart, labs and discussed the procedure including the risks, benefits and alternatives for the proposed anesthesia with the patient or authorized representative who has indicated his/her understanding and acceptance.     Dental advisory given  Plan Discussed with: CRNA and Surgeon  Anesthesia Plan Comments: (See PAT note 11/03/2023  Spinal w r adductor canal)       Anesthesia Quick Evaluation

## 2023-11-06 DIAGNOSIS — M9904 Segmental and somatic dysfunction of sacral region: Secondary | ICD-10-CM | POA: Diagnosis not present

## 2023-11-06 DIAGNOSIS — M9901 Segmental and somatic dysfunction of cervical region: Secondary | ICD-10-CM | POA: Diagnosis not present

## 2023-11-06 DIAGNOSIS — M9903 Segmental and somatic dysfunction of lumbar region: Secondary | ICD-10-CM | POA: Diagnosis not present

## 2023-11-06 DIAGNOSIS — M9902 Segmental and somatic dysfunction of thoracic region: Secondary | ICD-10-CM | POA: Diagnosis not present

## 2023-11-06 NOTE — H&P (Signed)
 TOTAL KNEE ADMISSION H&P  Patient is being admitted for right total knee arthroplasty.  Subjective:  Chief Complaint:right knee pain.  HPI: Brittany [name], 89 y.o. female, has a history of pain and functional disability in the right knee due to arthritis and has failed non-surgical conservative treatments for greater than 12 weeks to includeNSAID's and/or analgesics, corticosteriod injections, viscosupplementation injections, flexibility and strengthening excercises, supervised PT with diminished ADL's post treatment, use of assistive devices, weight reduction as appropriate, and activity modification.  Onset of symptoms was gradual, starting 5 years ago with gradually worsening course since that time. The patient noted no past surgery on the right knee(s).  Patient currently rates pain in the right knee(s) at 10 out of 10 with activity. Patient has night pain, worsening of pain with activity and weight bearing, pain that interferes with activities of daily living, crepitus, and joint swelling.  Patient has evidence of subchondral cysts, subchondral sclerosis, periarticular osteophytes, and joint space narrowing by imaging studies. There is no active infection.  Patient Active Problem List   Diagnosis Date Noted   Osteoarthritis of right knee 08/21/2023   Syncope 12/02/2022   Hypokalemia 12/02/2022   Fall at home, initial encounter 12/02/2022   SDH (subdural hematoma) (HCC) 12/02/2022   Subdural hematoma (HCC) 12/01/2022   Anemia 08/29/2021   DOE (dyspnea on exertion) 08/22/2021   Primary localized osteoarthritis of right hip 08/14/2021   Status post hip replacement, right 08/14/2021   Unilateral primary osteoarthritis, left knee 02/13/2021   Obesity (BMI 30.0-34.9) 06/21/2020   Sinus bradycardia 04/12/2020   Chronic heart failure with preserved ejection fraction (HFpEF) (HCC) 04/12/2020   Persistent atrial fibrillation (HCC) 03/22/2019   A-fib (HCC) 03/22/2019   Stroke (cerebrum) (HCC)  12/30/2017   Dizziness 06/06/2017   Overactive bladder 06/06/2017   Abnormal mammogram 05/01/2017   Hyperparathyroidism (HCC) 02/12/2017   Vitamin D deficiency 08/14/2016   Osteoporosis 08/14/2016   Hyperlipidemia 08/14/2016   Atherosclerosis of coronary artery 01/02/2016   Hypercalcemia 03/29/2015   Multiple pulmonary nodules 12/30/2013   Lumbar spinal stenosis 12/22/2013   Arthropathy of facet joint 10/08/2013   Atrial fibrillation (HCC) 03/04/2013   Sinus node dysfunction (HCC) 03/04/2013   Pacemaker-Medtronic 03/04/2013   OSA on CPAP 02/23/2013   Anxiety and depression 02/23/2013   Chronic gastritis 02/23/2013   Essential hypertension, benign 02/23/2013   Urinary incontinence 02/23/2013   Past Medical History:  Diagnosis Date   Ankle fracture 2016   Arthritis    Atrial fibrillation (HCC)    Depression    Dysrhythmia    Elevated parathyroid hormone    Fracture of orbital floor with routine healing    GERD (gastroesophageal reflux disease)    History of colon polyps    benign   Hypercalcemia    Hypertension    Hyponatremia    Hypothyroid    Macular degeneration    wet in the right and dry in the left   OSA on CPAP    Osteoporosis    Peripheral vascular disease (HCC)    Presence of permanent cardiac pacemaker    PSVT (paroxysmal supraventricular tachycardia) (HCC)    Rotator cuff tear    Sinus node dysfunction (HCC)    a. s/p MDT pacemaker   Stroke [facility])    Urinary incontinence    Wrist fracture 2018    Past Surgical History:  Procedure Laterality Date   BACK SURGERY     CARDIOVERSION N/A 05/02/2022   Procedure: CARDIOVERSION;  Surgeon: Hazle Lites,  MD;  Location: [facility] ENDOSCOPY;  Service: Cardiovascular;  Laterality: N/A;   CATARACT EXTRACTION     CATARACT EXTRACTION W/ INTRAOCULAR LENS  IMPLANT, BILATERAL Bilateral    COLONOSCOPY     ESOPHAGOGASTRODUODENOSCOPY     IR ANGIO INTRA EXTRACRAN SEL COM CAROTID INNOMINATE BILAT MOD SED  12/30/2017   IR  ANGIO VERTEBRAL SEL VERTEBRAL BILAT MOD SED  12/30/2017   LAPAROSCOPIC CHOLECYSTECTOMY  08/11/1999   LUMBAR DISC SURGERY  02/19/2014   ORIF ANKLE FRACTURE Left 04/13/2015   Procedure: OPEN REDUCTION INTERNAL FIXATION (ORIF) LEFT ANKLE FRACTURE;  Surgeon: Cammy Copa, MD;  Location: [facility] OR;  Service: Orthopedics;  Laterality: Left;   PACEMAKER PLACEMENT Right 07/22/2010   a. MDT dual chamber PPM implanted by Dr Graciela Husbands    [facility] GENERATOR CHANGEOUT N/A 08/22/2022   Procedure: PPM GENERATOR Janeann Merl;  Surgeon: Duke Salvia, MD;  Location: [facility] INVASIVE CV LAB;  Service: Cardiovascular;  Laterality: N/A;   SHOULDER ARTHROSCOPY W/ ROTATOR CUFF REPAIR Right 08/30/2014   WITH MINI-OPEN ROTATOR CUFF REPAIR AND SUBACROMIAL DECOMPRESSION   SHOULDER ARTHROSCOPY WITH ROTATOR CUFF REPAIR AND SUBACROMIAL DECOMPRESSION Right 08/30/2014   Procedure: SHOULDER ARTHROSCOPY WITH MINI-OPEN ROTATOR CUFF REPAIR AND SUBACROMIAL DECOMPRESSION;  Surgeon: Cammy Copa, MD;  Location: [facility] OR;  Service: Orthopedics;  Laterality: Right;  RIGHT SHOULDER DIAGNOSTIC OPERATIVE ARTHROSCOPY, SUBACROMIAL DECOMPRESSION, MINI-OPEN ROTATOR CUFF REPAIR.   TOTAL HIP ARTHROPLASTY Right 08/14/2021   Procedure: TOTAL HIP ARTHROPLASTY ANTERIOR APPROACH;  Surgeon: Marcene Corning, MD;  Location: [facility] ORS;  Service: Orthopedics;  Laterality: Right;   TOTAL KNEE ARTHROPLASTY Left 02/13/2021   Procedure: LEFT TOTAL KNEE ARTHROPLASTY;  Surgeon: Marcene Corning, MD;  Location: [facility] ORS;  Service: Orthopedics;  Laterality: Left;   VAGINAL HYSTERECTOMY  07/22/1968   WRIST FRACTURE SURGERY Left     No current facility-administered medications for this encounter.   Current Outpatient Medications  Medication Sig Dispense Refill Last Dose/Taking   acetaminophen (TYLENOL) 500 MG tablet Take 500 mg by mouth 2 (two) times daily.   Taking   alendronate (FOSAMAX) 70 MG tablet Take 1 tablet (70 mg total) by mouth every 7 (seven) days. Take with a full  glass of water on an empty stomach. (Patient taking differently: Take 70 mg by mouth every Wednesday. Take with a full glass of water on an empty stomach.) 4 tablet 11 Taking Differently   apixaban (ELIQUIS) 5 MG TABS tablet Take 1 tablet (5 mg total) by mouth 2 (two) times daily. 180 tablet 3 Taking   atorvastatin (LIPITOR) 40 MG tablet Take 40 mg by mouth in the morning.   Taking   B Complex-C (B-COMPLEX WITH VITAMIN C) tablet Take 1 tablet by mouth in the morning.   Taking   Cholecalciferol (VITAMIN D3) 125 MCG (5000 UT) TABS Take 5,000 Units by mouth daily with breakfast.   Taking   dapagliflozin propanediol (FARXIGA) 10 MG TABS tablet Take 1 tablet (10 mg total) by mouth daily before breakfast. 90 tablet 3 Taking   digoxin (LANOXIN) 0.125 MG tablet Take 1 tablet (0.125 mg total) by mouth daily. 90 tablet 3 Taking   furosemide (LASIX) 40 MG tablet Take 2 tablets (80 mg total) by mouth every other day. TAKE 80 MG ALTERNATING 40 MG EVERY OTHER DAY (Patient taking differently: Take 40-80 mg by mouth every other day. TAKE 80 MG ALTERNATING 40 MG EVERY OTHER DAY) 90 tablet 2 Taking Differently   levothyroxine (SYNTHROID) 88 MCG tablet Take 88 mcg by mouth daily before  breakfast.   Taking   Lutein 20 MG TABS Take 20 mg by mouth daily at 12 noon.   Taking   magnesium oxide (MAG-OX) 400 MG tablet Take 1 tablet (400 mg total) by mouth daily. 30 tablet 0 Taking   Melatonin 10 MG TABS Take 10-40 mg by mouth at bedtime as needed (sleep).   Taking As Needed   metoprolol succinate (TOPROL-XL) 50 MG 24 hr tablet Take 2 tablets by  mouth with or following a meal 3 times daily (Patient taking differently: Take 50 mg by mouth in the morning, at noon, and at bedtime.) 540 tablet 0 Taking Differently   Multiple Vitamin (MULTIVITAMIN WITH MINERALS) TABS tablet Take 1 tablet by mouth daily at 12 noon.   Taking   Multiple Vitamins-Minerals (PRESERVISION AREDS 2 PO) Take 1 capsule by mouth 2 (two) times daily with a meal.     Taking   sertraline (ZOLOFT) 50 MG tablet Take 1 tablet (50 mg total) by mouth daily. 30 tablet 2 Taking   spironolactone (ALDACTONE) 25 MG tablet Take 1 tablet (25 mg total) by mouth daily. (Patient taking differently: Take 25 mg by mouth daily at 12 noon.) 90 tablet 3 Taking Differently   Allergies  Allergen Reactions   Citalopram Hydrobromide Other (See Comments)    QT prolongation with Tikosyn   Adhesive [Tape] Swelling and Rash   Bacitracin-Polymyxin B Rash   Benzalkonium Chloride Rash    Pt was not aware of this allergy   Neosporin [Neomycin-Polymyxin-Gramicidin] Swelling and Rash    Social History   Tobacco Use   Smoking status: Former    Current packs/day: 0.00    Average packs/day: 1 pack/day for 45.0 years (45.0 ttl pk-yrs)    Types: Cigarettes    Start date: 75    Quit date: 49    Years since quitting: 33.3   Smokeless tobacco: Never   Tobacco comments:    quit smoking in 1992  Substance Use Topics   Alcohol use: Yes    Alcohol/week: 28.0 standard drinks of alcohol    Types: 28 Glasses of wine per week    Comment: "16oz wine at dinner time"    Family History  Problem Relation Age of Onset   Colon cancer Mother        colon   Cancer Father        unknown type   Stroke Sister    Skin cancer Brother    Heart disease Brother    Breast cancer Paternal Aunt    Asthma Brother      Review of Systems  Musculoskeletal:  Positive for arthralgias.       Right knee  All other systems reviewed and are negative.   Objective:  Physical Exam Constitutional:      Appearance: Normal appearance.  HENT:     Head: Normocephalic and atraumatic.     Nose: Nose normal.     Mouth/Throat:     Pharynx: Oropharynx is clear.  Eyes:     Extraocular Movements: Extraocular movements intact.  Pulmonary:     Effort: Pulmonary effort is normal.  Abdominal:     Palpations: Abdomen is soft.  Musculoskeletal:     Cervical back: Normal range of motion.     Comments:  Right knee motion is about 5-95 with trace effusion.  She has a mild valgus deformity.  There is crepitation and mostly lateral pain.  Hip motion is good and straight leg raise is negative.  Skin:    General: Skin is warm and dry.  Neurological:     General: No focal deficit present.     Mental Status: She is alert and oriented to person, place, and time. Mental status is at baseline.  Psychiatric:        Mood and Affect: Mood normal.        Behavior: Behavior normal.        Thought Content: Thought content normal.        Judgment: Judgment normal.     Vital signs in last 24 hours:    Labs:   Estimated body mass index is 31.96 kg/m as calculated from the following:   Height as of 11/03/23: 5\' 8"  (1.727 m).   Weight as of 11/03/23: 95.4 kg.   Imaging Review Plain radiographs demonstrate severe degenerative joint disease of the right knee(s). The overall alignment isneutral. The bone quality appears to be good for age and reported activity level.      Assessment/Plan:  End stage primary arthritis, right knee   The patient history, physical examination, clinical judgment of the provider and imaging studies are consistent with end stage degenerative joint disease of the right knee(s) and total knee arthroplasty is deemed medically necessary. The treatment options including medical management, injection therapy arthroscopy and arthroplasty were discussed at length. The risks and benefits of total knee arthroplasty were presented and reviewed. The risks due to aseptic loosening, infection, stiffness, patella tracking problems, thromboembolic complications and other imponderables were discussed. The patient acknowledged the explanation, agreed to proceed with the plan and consent was signed. Patient is being admitted for inpatient treatment for surgery, pain control, PT, OT, prophylactic antibiotics, VTE prophylaxis, progressive ambulation and ADL's and discharge planning. The patient  is planning to be discharged home with home health services     Patient's anticipated LOS is less than 2 midnights, meeting these requirements: - Younger than 84 - Lives within 1 hour of care - Has a competent adult at home to recover with post-op recover - NO history of  - Chronic pain requiring opiods  - Diabetes  - Coronary Artery Disease  - Heart failure  - Heart attack  - Stroke  - DVT/VTE  - Cardiac arrhythmia  - Respiratory Failure/COPD  - Renal failure  - Anemia  - Advanced Liver disease

## 2023-11-07 DIAGNOSIS — M25561 Pain in right knee: Secondary | ICD-10-CM | POA: Diagnosis not present

## 2023-11-10 MED ORDER — TRANEXAMIC ACID 1000 MG/10ML IV SOLN
2000.0000 mg | INTRAVENOUS | Status: DC
  Filled 2023-11-10: qty 20

## 2023-11-11 ENCOUNTER — Ambulatory Visit (HOSPITAL_COMMUNITY)
Admission: RE | Admit: 2023-11-11 | Discharge: 2023-11-11 | Disposition: A | Discharge status: Home / Self Care | Attending: Orthopaedic Surgery | Admitting: Orthopaedic Surgery

## 2023-11-11 ENCOUNTER — Ambulatory Visit (HOSPITAL_COMMUNITY): Payer: Self-pay | Admitting: Physician Assistant

## 2023-11-11 ENCOUNTER — Ambulatory Visit (HOSPITAL_COMMUNITY): Admitting: Certified Registered"

## 2023-11-11 ENCOUNTER — Encounter (HOSPITAL_COMMUNITY)
Admission: RE | Disposition: A | Discharge status: Home / Self Care | Payer: Self-pay | Source: Home / Self Care | Attending: Orthopaedic Surgery

## 2023-11-11 ENCOUNTER — Other Ambulatory Visit: Payer: Self-pay

## 2023-11-11 ENCOUNTER — Encounter (HOSPITAL_COMMUNITY): Payer: Self-pay | Admitting: Orthopaedic Surgery

## 2023-11-11 DIAGNOSIS — Z87891 Personal history of nicotine dependence: Secondary | ICD-10-CM | POA: Insufficient documentation

## 2023-11-11 DIAGNOSIS — M1711 Unilateral primary osteoarthritis, right knee: Secondary | ICD-10-CM

## 2023-11-11 DIAGNOSIS — G4730 Sleep apnea, unspecified: Secondary | ICD-10-CM | POA: Diagnosis not present

## 2023-11-11 DIAGNOSIS — I2510 Atherosclerotic heart disease of native coronary artery without angina pectoris: Secondary | ICD-10-CM

## 2023-11-11 DIAGNOSIS — I110 Hypertensive heart disease with heart failure: Secondary | ICD-10-CM | POA: Diagnosis not present

## 2023-11-11 DIAGNOSIS — I4891 Unspecified atrial fibrillation: Secondary | ICD-10-CM | POA: Insufficient documentation

## 2023-11-11 DIAGNOSIS — E039 Hypothyroidism, unspecified: Secondary | ICD-10-CM | POA: Insufficient documentation

## 2023-11-11 DIAGNOSIS — Z7901 Long term (current) use of anticoagulants: Secondary | ICD-10-CM | POA: Insufficient documentation

## 2023-11-11 DIAGNOSIS — I5032 Chronic diastolic (congestive) heart failure: Secondary | ICD-10-CM | POA: Diagnosis not present

## 2023-11-11 DIAGNOSIS — I10 Essential (primary) hypertension: Secondary | ICD-10-CM | POA: Diagnosis not present

## 2023-11-11 DIAGNOSIS — Z950 Presence of cardiac pacemaker: Secondary | ICD-10-CM | POA: Insufficient documentation

## 2023-11-11 DIAGNOSIS — Z96651 Presence of right artificial knee joint: Secondary | ICD-10-CM | POA: Diagnosis not present

## 2023-11-11 DIAGNOSIS — G8918 Other acute postprocedural pain: Secondary | ICD-10-CM | POA: Diagnosis not present

## 2023-11-11 DIAGNOSIS — Z79890 Hormone replacement therapy (postmenopausal): Secondary | ICD-10-CM | POA: Diagnosis not present

## 2023-11-11 DIAGNOSIS — G4733 Obstructive sleep apnea (adult) (pediatric): Secondary | ICD-10-CM

## 2023-11-11 HISTORY — PX: TOTAL KNEE ARTHROPLASTY: SHX125

## 2023-11-11 SURGERY — ARTHROPLASTY, KNEE, TOTAL
Anesthesia: Regional | Site: Knee | Laterality: Right

## 2023-11-11 MED ORDER — METHOCARBAMOL 1000 MG/10ML IJ SOLN: 500.0000 mg | Freq: Four times a day (QID) | INTRAMUSCULAR | Status: DC | PRN

## 2023-11-11 MED ORDER — ONDANSETRON HCL 4 MG/2ML IJ SOLN
INTRAMUSCULAR | Status: AC
  Filled 2023-11-11: qty 2

## 2023-11-11 MED ORDER — LIDOCAINE HCL (CARDIAC) PF 100 MG/5ML IV SOSY
PREFILLED_SYRINGE | INTRAVENOUS | Status: DC | PRN
  Administered 2023-11-11: 40 mg via INTRAVENOUS

## 2023-11-11 MED ORDER — SODIUM CHLORIDE (PF) 0.9 % IJ SOLN
INTRAMUSCULAR | Status: AC
  Filled 2023-11-11: qty 30

## 2023-11-11 MED ORDER — LACTATED RINGERS IV SOLN: INTRAVENOUS | Status: DC

## 2023-11-11 MED ORDER — BUPIVACAINE LIPOSOME 1.3 % IJ SUSP: 20.0000 mL | Freq: Once | INTRAMUSCULAR | Status: DC

## 2023-11-11 MED ORDER — ROPIVACAINE HCL 5 MG/ML IJ SOLN
INTRAMUSCULAR | Status: DC | PRN
  Administered 2023-11-11: 30 mL via PERINEURAL

## 2023-11-11 MED ORDER — ACETAMINOPHEN 500 MG PO TABS
500.0000 mg | ORAL_TABLET | Freq: Four times a day (QID) | ORAL | Status: DC
  Administered 2023-11-11: 500 mg via ORAL

## 2023-11-11 MED ORDER — BUPIVACAINE IN DEXTROSE 0.75-8.25 % IT SOLN
INTRATHECAL | Status: DC | PRN
  Administered 2023-11-11: 1.8 mL via INTRATHECAL

## 2023-11-11 MED ORDER — ORAL CARE MOUTH RINSE: 15.0000 mL | Freq: Once | OROMUCOSAL | Status: AC

## 2023-11-11 MED ORDER — FENTANYL CITRATE (PF) 100 MCG/2ML IJ SOLN
INTRAMUSCULAR | Status: AC
  Filled 2023-11-11: qty 2

## 2023-11-11 MED ORDER — BUPIVACAINE LIPOSOME 1.3 % IJ SUSP
INTRAMUSCULAR | Status: AC
  Filled 2023-11-11: qty 20

## 2023-11-11 MED ORDER — PROPOFOL 500 MG/50ML IV EMUL
INTRAVENOUS | Status: DC | PRN
  Administered 2023-11-11: 100 ug/kg/min via INTRAVENOUS

## 2023-11-11 MED ORDER — PROPOFOL 10 MG/ML IV BOLUS
INTRAVENOUS | Status: DC | PRN
  Administered 2023-11-11: 30 mg via INTRAVENOUS
  Administered 2023-11-11 (×2): 40 mg via INTRAVENOUS
  Administered 2023-11-11: 50 mg via INTRAVENOUS

## 2023-11-11 MED ORDER — METHOCARBAMOL 500 MG PO TABS: 500.0000 mg | ORAL_TABLET | Freq: Four times a day (QID) | ORAL | Status: DC | PRN

## 2023-11-11 MED ORDER — ACETAMINOPHEN 500 MG PO TABS
ORAL_TABLET | ORAL | Status: AC
  Filled 2023-11-11: qty 2

## 2023-11-11 MED ORDER — CEFAZOLIN SODIUM-DEXTROSE 2-4 GM/100ML-% IV SOLN
INTRAVENOUS | Status: AC
  Filled 2023-11-11: qty 100

## 2023-11-11 MED ORDER — TIZANIDINE HCL 2 MG PO TABS: 2.0000 mg | ORAL_TABLET | Freq: Four times a day (QID) | ORAL | 1 refills | Status: DC | PRN

## 2023-11-11 MED ORDER — DEXAMETHASONE SODIUM PHOSPHATE 10 MG/ML IJ SOLN
INTRAMUSCULAR | Status: AC
  Filled 2023-11-11: qty 1

## 2023-11-11 MED ORDER — TRANEXAMIC ACID-NACL 1000-0.7 MG/100ML-% IV SOLN
1000.0000 mg | INTRAVENOUS | Status: AC
  Administered 2023-11-11: 1000 mg via INTRAVENOUS
  Filled 2023-11-11: qty 100

## 2023-11-11 MED ORDER — BUPIVACAINE LIPOSOME 1.3 % IJ SUSP
INTRAMUSCULAR | Status: DC | PRN
  Administered 2023-11-11: 20 mL

## 2023-11-11 MED ORDER — BUPIVACAINE-EPINEPHRINE 0.5% -1:200000 IJ SOLN
INTRAMUSCULAR | Status: DC | PRN
  Administered 2023-11-11: 30 mL

## 2023-11-11 MED ORDER — FENTANYL CITRATE PF 50 MCG/ML IJ SOSY: 25.0000 ug | PREFILLED_SYRINGE | INTRAMUSCULAR | Status: DC | PRN

## 2023-11-11 MED ORDER — PROPOFOL 10 MG/ML IV BOLUS
INTRAVENOUS | Status: AC
  Filled 2023-11-11: qty 20

## 2023-11-11 MED ORDER — POVIDONE-IODINE 10 % EX SWAB: 2.0000 | Freq: Once | CUTANEOUS | Status: DC

## 2023-11-11 MED ORDER — FENTANYL CITRATE (PF) 100 MCG/2ML IJ SOLN
INTRAMUSCULAR | Status: DC | PRN
  Administered 2023-11-11: 50 ug via INTRAVENOUS
  Administered 2023-11-11: 25 ug via INTRAVENOUS

## 2023-11-11 MED ORDER — ONDANSETRON HCL 4 MG/2ML IJ SOLN
INTRAMUSCULAR | Status: DC | PRN
  Administered 2023-11-11: 4 mg via INTRAVENOUS

## 2023-11-11 MED ORDER — CLONIDINE HCL (ANALGESIA) 100 MCG/ML EP SOLN
EPIDURAL | Status: DC | PRN
  Administered 2023-11-11: 100 ug

## 2023-11-11 MED ORDER — BUPIVACAINE-EPINEPHRINE (PF) 0.5% -1:200000 IJ SOLN
INTRAMUSCULAR | Status: AC
  Filled 2023-11-11: qty 30

## 2023-11-11 MED ORDER — HYDROCODONE-ACETAMINOPHEN 5-325 MG PO TABS: 1.0000 | ORAL_TABLET | Freq: Four times a day (QID) | ORAL | 0 refills | Status: DC | PRN

## 2023-11-11 MED ORDER — SODIUM CHLORIDE 0.9% IV SOLUTION
INTRAVENOUS | Status: DC | PRN
  Administered 2023-11-11: 3000 mL

## 2023-11-11 MED ORDER — TRANEXAMIC ACID-NACL 1000-0.7 MG/100ML-% IV SOLN
INTRAVENOUS | Status: AC
  Filled 2023-11-11: qty 100

## 2023-11-11 MED ORDER — CHLORHEXIDINE GLUCONATE 0.12 % MT SOLN
15.0000 mL | Freq: Once | OROMUCOSAL | Status: AC
  Administered 2023-11-11: 15 mL via OROMUCOSAL

## 2023-11-11 MED ORDER — TRANEXAMIC ACID-NACL 1000-0.7 MG/100ML-% IV SOLN
1000.0000 mg | Freq: Once | INTRAVENOUS | Status: AC
  Administered 2023-11-11: 1000 mg via INTRAVENOUS

## 2023-11-11 MED ORDER — SODIUM CHLORIDE (PF) 0.9 % IJ SOLN
INTRAMUSCULAR | Status: DC | PRN
  Administered 2023-11-11: 30 mL

## 2023-11-11 MED ORDER — ACETAMINOPHEN 10 MG/ML IV SOLN: 1000.0000 mg | Freq: Once | INTRAVENOUS | Status: DC | PRN

## 2023-11-11 MED ORDER — ONDANSETRON HCL 4 MG/2ML IJ SOLN: 4.0000 mg | Freq: Once | INTRAMUSCULAR | Status: DC | PRN

## 2023-11-11 MED ORDER — 0.9 % SODIUM CHLORIDE (POUR BTL) OPTIME
TOPICAL | Status: DC | PRN
  Administered 2023-11-11: 1000 mL

## 2023-11-11 MED ORDER — CEFAZOLIN SODIUM-DEXTROSE 2-4 GM/100ML-% IV SOLN: 2.0000 g | Freq: Four times a day (QID) | INTRAVENOUS | Status: DC

## 2023-11-11 MED ORDER — DEXAMETHASONE SODIUM PHOSPHATE 10 MG/ML IJ SOLN
INTRAMUSCULAR | Status: DC | PRN
  Administered 2023-11-11: 4 mg via INTRAVENOUS

## 2023-11-11 MED ORDER — DEXMEDETOMIDINE HCL IN NACL 80 MCG/20ML IV SOLN
INTRAVENOUS | Status: DC | PRN
  Administered 2023-11-11: 4 ug via INTRAVENOUS

## 2023-11-11 MED ORDER — LACTATED RINGERS IV BOLUS
500.0000 mL | Freq: Once | INTRAVENOUS | Status: AC
  Administered 2023-11-11: 500 mL via INTRAVENOUS

## 2023-11-11 MED ORDER — STERILE WATER FOR IRRIGATION IR SOLN
Status: DC | PRN
  Administered 2023-11-11: 2000 mL

## 2023-11-11 MED ORDER — TRANEXAMIC ACID 1000 MG/10ML IV SOLN
INTRAVENOUS | Status: DC | PRN
  Administered 2023-11-11: 2000 mg via TOPICAL

## 2023-11-11 MED ORDER — CEFAZOLIN SODIUM-DEXTROSE 2-4 GM/100ML-% IV SOLN
2.0000 g | INTRAVENOUS | Status: AC
  Administered 2023-11-11: 2 g via INTRAVENOUS
  Filled 2023-11-11: qty 100

## 2023-11-11 SURGICAL SUPPLY — 54 items
ATTUNE MED DOME PAT 38 KNEE (Knees) IMPLANT
ATTUNE PS FEM RT SZ 4 CEM KNEE (Femur) IMPLANT
ATTUNE PSRP INSR SZ4 6 KNEE (Insert) IMPLANT
BAG COUNTER SPONGE SURGICOUNT (BAG) ×1 IMPLANT
BAG DECANTER FOR FLEXI CONT (MISCELLANEOUS) ×1 IMPLANT
BAG ZIPLOCK 12X15 (MISCELLANEOUS) ×1 IMPLANT
BASE TIBIA ATTUNE KNEE SYS SZ6 (Knees) IMPLANT
BLADE SAGITTAL 25.0X1.19X90 (BLADE) ×1 IMPLANT
BLADE SAW SGTL 11.0X1.19X90.0M (BLADE) ×1 IMPLANT
BLADE SURG SZ10 CARB STEEL (BLADE) ×1 IMPLANT
BNDG ELASTIC 6INX 5YD STR LF (GAUZE/BANDAGES/DRESSINGS) ×1 IMPLANT
BNDG ELASTIC 6X10 VLCR STRL LF (GAUZE/BANDAGES/DRESSINGS) IMPLANT
BNDG GAUZE DERMACEA FLUFF 4 (GAUZE/BANDAGES/DRESSINGS) IMPLANT
BOOTIES KNEE HIGH SLOAN (MISCELLANEOUS) ×1 IMPLANT
BOWL SMART MIX CTS (DISPOSABLE) ×1 IMPLANT
CEMENT HV SMART SET (Cement) ×2 IMPLANT
COVER SURGICAL LIGHT HANDLE (MISCELLANEOUS) ×1 IMPLANT
CUFF TRNQT CYL 34X4.125X (TOURNIQUET CUFF) ×1 IMPLANT
DRAPE TOP 10253 STERILE (DRAPES) ×1 IMPLANT
DRAPE U-SHAPE 47X51 STRL (DRAPES) ×1 IMPLANT
DRSG AQUACEL AG ADV 3.5X10 (GAUZE/BANDAGES/DRESSINGS) ×1 IMPLANT
DRSG EMULSION OIL 3X16 NADH (GAUZE/BANDAGES/DRESSINGS) IMPLANT
DURAPREP 26ML APPLICATOR (WOUND CARE) ×2 IMPLANT
ELECT PENCIL ROCKER SW 15FT (MISCELLANEOUS) ×1 IMPLANT
ELECT REM PT RETURN 15FT ADLT (MISCELLANEOUS) ×1 IMPLANT
GAUZE PAD ABD 8X10 STRL (GAUZE/BANDAGES/DRESSINGS) IMPLANT
GAUZE SPONGE 4X4 12PLY STRL (GAUZE/BANDAGES/DRESSINGS) IMPLANT
GLOVE BIO SURGEON STRL SZ8 (GLOVE) ×2 IMPLANT
GLOVE BIOGEL PI IND STRL 7.0 (GLOVE) ×1 IMPLANT
GLOVE BIOGEL PI IND STRL 8 (GLOVE) ×2 IMPLANT
GLOVE SURG SYN 7.0 (GLOVE) ×1 IMPLANT
GLOVE SURG SYN 7.0 PF PI (GLOVE) ×1 IMPLANT
GOWN SRG XL LVL 4 BRTHBL STRL (GOWNS) ×1 IMPLANT
GOWN STRL REUS W/ TWL XL LVL3 (GOWN DISPOSABLE) ×2 IMPLANT
HOLDER FOLEY CATH W/STRAP (MISCELLANEOUS) IMPLANT
HOOD PEEL AWAY T7 (MISCELLANEOUS) ×3 IMPLANT
KIT TURNOVER KIT A (KITS) IMPLANT
MANIFOLD NEPTUNE II (INSTRUMENTS) ×1 IMPLANT
NS IRRIG 1000ML POUR BTL (IV SOLUTION) ×1 IMPLANT
PACK TOTAL KNEE CUSTOM (KITS) ×1 IMPLANT
PAD ARMBOARD POSITIONER FOAM (MISCELLANEOUS) ×1 IMPLANT
PIN STEINMAN FIXATION KNEE (PIN) IMPLANT
PROTECTOR NERVE ULNAR (MISCELLANEOUS) ×1 IMPLANT
SET HNDPC FAN SPRY TIP SCT (DISPOSABLE) ×1 IMPLANT
SPIKE FLUID TRANSFER (MISCELLANEOUS) ×2 IMPLANT
SUT ETHIBOND NAB CT1 #1 30IN (SUTURE) ×1 IMPLANT
SUT VIC AB 0 CT1 36 (SUTURE) ×2 IMPLANT
SUT VIC AB 2-0 CT1 TAPERPNT 27 (SUTURE) ×1 IMPLANT
SUT VICRYL AB 3-0 FS1 BRD 27IN (SUTURE) ×1 IMPLANT
SUTURE STRATFX 0 PDS 27 VIOLET (SUTURE) ×1 IMPLANT
TRAY FOLEY MTR SLVR 16FR STAT (SET/KITS/TRAYS/PACK) IMPLANT
WATER STERILE IRR 1000ML POUR (IV SOLUTION) ×2 IMPLANT
WRAP KNEE MAXI GEL POST OP (GAUZE/BANDAGES/DRESSINGS) ×1 IMPLANT
YANKAUER SUCT BULB TIP NO VENT (SUCTIONS) ×1 IMPLANT

## 2023-11-11 NOTE — Interval H&P Note (Signed)
 History and Physical Interval Note:  11/11/2023 7:20 AM  Brittany [name]  has presented today for surgery, with the diagnosis of right knee degenerative joint disease.  The various methods of treatment have been discussed with the patient and family. After consideration of risks, benefits and other options for treatment, the patient has consented to  Procedure(s) with comments: ARTHROPLASTY, KNEE, TOTAL (Right) - RIGHT KNEE ARTHROPLASTY as a surgical intervention.  The patient's history has been reviewed, patient examined, no change in status, stable for surgery.  I have reviewed the patient's chart and labs.  Questions were answered to the patient's satisfaction.     Adrien Shankar G Anjelique Makar

## 2023-11-11 NOTE — Evaluation (Signed)
 Physical Therapy Evaluation Patient Details Name: Brittany [name] MRN: [id number] DOB: [birth year] Today's Date: 11/11/2023  History of Present Illness  89 yo female s/P RTKA. PMH: stroke, PPM, macular degeneration, RTHA, LTKA  Clinical Impression  Pt admitted with above diagnosis.  Pt currently with functional limitations due to the deficits listed below (see PT Problem List). Pt will benefit from acute skilled PT to increase their independence and safety with mobility to allow discharge.     The  patient presents with right quad weakness and unable to safely take a step due to buckling. Patient has  history of falls .Patient plans to go to the rehab section at [facility]. Patient not safe for transfers , therefore not  ready to transport  to facility. Will see again for improved right knee strength and safe transfers to [facility] and car.      If plan is discharge home, recommend the following: Two people to help with walking and/or transfers;A little help with bathing/dressing/bathroom;Assist for transportation;Help with stairs or ramp for entrance   Can travel by private vehicle   No    Equipment Recommendations None recommended by PT  Recommendations for Other Services       Functional Status Assessment Patient has had a recent decline in their functional status and demonstrates the ability to make significant improvements in function in a reasonable and predictable amount of time.     Precautions / Restrictions Precautions Precautions: Fall Precaution/Restrictions Comments: has had 3 recent falls Restrictions Weight Bearing Restrictions Per Provider Order: No      Mobility  Bed Mobility Overal bed mobility: Needs Assistance Bed Mobility: Supine to Sit, Sit to Supine     Supine to sit: Min assist Sit to supine: Min assist   General bed mobility comments: assist right  LE    Transfers Overall transfer level: Needs assistance Equipment used: Rolling walker (2  wheels) Transfers: Sit to/from Stand Sit to Stand: Mod assist           General transfer comment: noted right leg buckling with WB, unable to  take a step    Ambulation/Gait                  Stairs            Wheelchair Mobility     Tilt Bed    Modified Rankin (Stroke Patients Only)       Balance Overall balance assessment: Needs assistance, History of Falls Sitting-balance support: Feet supported, Bilateral upper extremity supported Sitting balance-Leahy Scale: Fair     Standing balance support: During functional activity, Bilateral upper extremity supported, Reliant on assistive device for balance Standing balance-Leahy Scale: Poor                               Pertinent Vitals/Pain Pain Assessment Pain Assessment: (P) Faces Faces Pain Scale: Hurts little more Pain Location: right knee Pain Descriptors / Indicators: Grimacing Pain Intervention(s): Patient requesting pain meds-RN notified    Home Living Family/patient expects to be discharged to:: Skilled nursing facility Living Arrangements: Spouse/significant other Available Help at Discharge: Family;Available 24 hours/day Type of Home: Apartment Home Access: Level entry       Home Layout: One level Home Equipment: Agricultural consultant (2 wheels);Rollator (4 wheels);Grab bars - tub/shower;Hand held shower head Additional Comments: [facility] rehab will be DC plan    Prior Function Prior Level of Function : Independent/Modified Independent  Mobility Comments: uses rollator ADLs Comments: has had falls     Extremity/Trunk Assessment   Upper Extremity Assessment Upper Extremity Assessment: Overall WFL for tasks assessed    Lower Extremity Assessment Lower Extremity Assessment: RLE deficits/detail RLE Deficits / Details: lag with SLR, knee buckles with WB    Cervical / Trunk Assessment Cervical / Trunk Assessment: Normal  Communication    Communication Communication: No apparent difficulties    Cognition Arousal: Alert Behavior During Therapy: WFL for tasks assessed/performed   PT - Cognitive impairments: No apparent impairments                         Following commands: Intact       Cueing       General Comments      Exercises     Assessment/Plan    PT Assessment Patient needs continued PT services  PT Problem List Decreased strength;Decreased mobility;Decreased safety awareness;Decreased range of motion;Decreased knowledge of precautions;Decreased activity tolerance;Decreased balance;Pain       PT Treatment Interventions DME instruction;Therapeutic exercise;Gait training;Functional mobility training;Therapeutic activities;Patient/family education    PT Goals (Current goals can be found in the Care Plan section)  Acute Rehab PT Goals Patient Stated Goal: go home PT Goal Formulation: With patient/family Time For Goal Achievement: 11/25/23 Potential to Achieve Goals: Good    Frequency 7X/week     Co-evaluation               AM-PAC PT "6 Clicks" Mobility  Outcome Measure Help needed turning from your back to your side while in a flat bed without using bedrails?: A Little Help needed moving from lying on your back to sitting on the side of a flat bed without using bedrails?: A Little Help needed moving to and from a bed to a chair (including a wheelchair)?: Total Help needed standing up from a chair using your arms (e.g., wheelchair or bedside chair)?: Total Help needed to walk in hospital room?: Total   6 Click Score: 9    End of Session Equipment Utilized During Treatment: Gait belt Activity Tolerance: Patient tolerated treatment well Patient left: in bed Nurse Communication: Mobility status PT Visit Diagnosis: Unsteadiness on feet (R26.81);Muscle weakness (generalized) (M62.81);Difficulty in walking, not elsewhere classified (R26.2)    Time: 1610-9604 PT Time Calculation  (min) (ACUTE ONLY): 24 min   Charges:   PT Evaluation $PT Eval Low Complexity: 1 Low PT Treatments $Therapeutic Activity: 8-22 mins PT General Charges $$ ACUTE PT VISIT: 1 Visit         Abelina Hoes PT Acute Rehabilitation Services Office [phone] Weekend [phone]   Dareen Ebbing 11/11/2023, 3:34 PM

## 2023-11-11 NOTE — Anesthesia Procedure Notes (Signed)
 Procedure Name: LMA Insertion Date/Time: 11/11/2023 8:12 AM  Performed by: Alwyn Juba, CRNAPre-anesthesia Checklist: Patient identified, Emergency Drugs available, Suction available, Patient being monitored and Timeout performed Patient Re-evaluated:Patient Re-evaluated prior to induction Oxygen Delivery Method: Circle system utilized Preoxygenation: Pre-oxygenation with 100% oxygen Induction Type: IV induction LMA: LMA inserted LMA Size: 4.0 Placement Confirmation: positive ETCO2 Tube secured with: Tape Dental Injury: Teeth and Oropharynx as per pre-operative assessment  Comments: Placed by Dr Lasalle Pointer

## 2023-11-11 NOTE — Op Note (Signed)
 PREOP DIAGNOSIS: DJD RIGHT KNEE POSTOP DIAGNOSIS: same PROCEDURE: RIGHT TKR ANESTHESIA: Spinal and MAC and general ATTENDING SURGEON: Alphonzo Ask ASSISTANT: Brittany Sachs PA  INDICATIONS FOR PROCEDURE: Brittany [name] is a 89 y.o. female who has struggled for a long time with pain due to degenerative arthritis of the right knee.  The patient has failed many conservative non-operative measures and at this point has pain which limits the ability to sleep and walk.  The patient is offered total knee replacement.  Informed operative consent was obtained after discussion of possible risks of anesthesia, infection, neurovascular injury, DVT, and death.  The importance of the post-operative rehabilitation protocol to optimize result was stressed extensively with the patient.  SUMMARY OF FINDINGS AND PROCEDURE:  Brittany [name] was taken to the operative suite where under the above anesthesia a right knee replacement was performed.  There were advanced degenerative changes and the bone quality was poor.  We used the DePuy Attune system and placed size 4 femur, 6 tibia, 38 mm all polyethylene patella, and a size 6 mm spacer.  Brittany Sachs PA-C assisted throughout and was invaluable to the completion of the case in that he helped retract and maintain exposure while I placed components.  He also helped close thereby minimizing OR time.  The patient was admitted for appropriate post-op care to include perioperative antibiotics and mechanical and pharmacologic measures for DVT prophylaxis.  DESCRIPTION OF PROCEDURE:  Brittany [name] was taken to the operative suite where the above anesthesia was applied.  The patient was positioned supine and prepped and draped in normal sterile fashion.  An appropriate time out was performed.  After the administration of kefzol  pre-op antibiotic the leg was elevated and exsanguinated and a tourniquet inflated. A standard longitudinal incision was made on the anterior knee.   Dissection was carried down to the extensor mechanism.  All appropriate anti-infective measures were used including the pre-operative antibiotic, betadine  impregnated drape, and closed hooded exhaust systems for each member of the surgical team.  A medial parapatellar incision was made in the extensor mechanism and the knee cap flipped and the knee flexed.  Some residual meniscal tissues were removed along with any remaining ACL/PCL tissue.  A guide was placed on the tibia and a flat cut was made on it's superior surface.  An intramedullary guide was placed in the femur and was utilized to make anterior and posterior cuts creating an appropriate flexion gap.  A second intramedullary guide was placed in the femur to make a distal cut properly balancing the knee with an extension gap equal to the flexion gap.  The three bones sized to the above mentioned sizes and the appropriate guides were placed and utilized.  A trial reduction was done and the knee easily came to full extension and the patella tracked well on flexion.  The trial components were removed and all bones were cleaned with pulsatile lavage and then dried thoroughly.  Cement was mixed and was pressurized onto the bones followed by placement of the aforementioned components.  Excess cement was trimmed and pressure was held on the components until the cement had hardened.  The tourniquet was deflated and a small amount of bleeding was controlled with cautery and pressure.  The knee was irrigated thoroughly.  The extensor mechanism was re-approximated with #1 ethibond in interrupted fashion.  The knee was flexed and the repair was solid.  The subcutaneous tissues were re-approximated with #0 and #2-0 vicryl and the skin  closed with a subcuticular stitch and steristrips.  A sterile dressing was applied.  Intraoperative fluids, EBL, and tourniquet time can be obtained from anesthesia records.  DISPOSITION:  The patient was taken to recovery room in stable  condition and scheduled to potentially go home same day depending on ability to walk and tolerate liquids..  Brittany [name] Brittany [name] 11/11/2023, 9:16 AM

## 2023-11-11 NOTE — Anesthesia Procedure Notes (Signed)
 Procedure Name: MAC Date/Time: 11/11/2023 7:34 AM  Performed by: Alwyn Juba, CRNAPre-anesthesia Checklist: Patient identified, Emergency Drugs available, Suction available, Patient being monitored and Timeout performed Oxygen Delivery Method: Simple face mask Placement Confirmation: positive ETCO2

## 2023-11-11 NOTE — Transfer of Care (Signed)
 Immediate Anesthesia Transfer of Care Note  Patient: Brittany [name]  Procedure(s) Performed: ARTHROPLASTY, KNEE, TOTAL (Right: Knee)  Patient Location: PACU  Anesthesia Type:General and Spinal  Level of Consciousness: awake, alert , and patient cooperative  Airway & Oxygen Therapy: Patient Spontanous Breathing and Patient connected to face mask oxygen  Post-op Assessment: Report given to RN and Post -op Vital signs reviewed and stable  Post vital signs: Reviewed and stable  Last Vitals:  Vitals Value Taken Time  BP 131/60 11/11/23 0945  Temp    Pulse 60 11/11/23 0947  Resp 16 11/11/23 0947  SpO2 100 % 11/11/23 0947  Vitals shown include unfiled device data.  Last Pain:  Vitals:   11/11/23 0608  TempSrc:   PainSc: 0-No pain      Patients Stated Pain Goal: 4 (11/11/23 8295)  Complications: No notable events documented.

## 2023-11-11 NOTE — Anesthesia Procedure Notes (Addendum)
 Spinal  Patient location during procedure: OR Start time: 11/11/2023 7:44 AM Reason for block: surgical anesthesia Staffing Performed: resident/CRNA  Anesthesiologist: Rosalita Combe, MD Resident/CRNA: Alwyn Juba, CRNA Performed by: Alwyn Juba, CRNA Authorized by: Rosalita Combe, MD   Preanesthetic Checklist Completed: patient identified, IV checked, site marked, risks and benefits discussed, surgical consent, monitors and equipment checked, pre-op evaluation and timeout performed Spinal Block Patient position: sitting Prep: DuraPrep and site prepped and draped Patient monitoring: continuous pulse ox, blood pressure, cardiac monitor and heart rate Approach: midline Location: L3-4 Injection technique: single-shot Needle Needle type: Pencan  Needle gauge: 24 G Needle length: 10 cm Assessment Events: CSF return Additional Notes Pt placed in sitting position, spinal kit expiration date checked and verified, timeout performed, + CSF, - heme, pt tolerated well. Dr Lasalle Pointer present and supervising throughout SAB placement.

## 2023-11-11 NOTE — Anesthesia Procedure Notes (Signed)
 Procedure Name: LMA Insertion Date/Time: 11/11/2023 8:19 AM  Performed by: Alwyn Juba, CRNAPre-anesthesia Checklist: Patient identified, Emergency Drugs available, Suction available, Patient being monitored and Timeout performed Patient Re-evaluated:Patient Re-evaluated prior to induction Oxygen Delivery Method: Circle system utilized Preoxygenation: Pre-oxygenation with 100% oxygen Induction Type: IV induction LMA: LMA with gastric port inserted LMA Size: 4.0 Tube secured with: Tape Dental Injury: Teeth and Oropharynx as per pre-operative assessment

## 2023-11-11 NOTE — Anesthesia Postprocedure Evaluation (Signed)
 Anesthesia Post Note  Patient: Brittany [name]  Procedure(s) Performed: ARTHROPLASTY, KNEE, TOTAL (Right: Knee)     Patient location during evaluation: Nursing Unit Anesthesia Type: Regional and General Level of consciousness: oriented and awake and alert Pain management: pain level controlled Vital Signs Assessment: post-procedure vital signs reviewed and stable Respiratory status: spontaneous breathing and respiratory function stable Cardiovascular status: blood pressure returned to baseline and stable Postop Assessment: no headache, no backache, no apparent nausea or vomiting and patient able to bend at knees Anesthetic complications: no  No notable events documented.  Last Vitals:  Vitals:   11/11/23 1045 11/11/23 1102  BP: (!) 149/65 (!) 160/65  Pulse: (!) 59   Resp: 15   Temp:  36.6 C  SpO2: 92% 96%    Last Pain:  Vitals:   11/11/23 1112  TempSrc:   PainSc: 0-No pain                 Rosalita Combe

## 2023-11-11 NOTE — NC FL2 (Addendum)
 [place]  MEDICAID FL2 LEVEL OF CARE FORM     IDENTIFICATION  Patient Name: Brittany [name] Birthdate: 1934-05-23 Sex: female Admission Date (Current Location): 11/11/2023  [facility] and [place] Number:  Producer, television/film/video and Address:  [facility],  [address], [place] [address]      Provider Number: [id number]  Attending Physician Name and Address:  No att. providers found  Relative Name and Phone Number:  Whitni, Pasquini (Spouse)  [phone] (Mobile)    Current Level of Care: Hospital Recommended Level of Care: Skilled Nursing Facility Prior Approval Number:    Date Approved/Denied:   PASRR Number: [id number]  Discharge Plan: SNF    Current Diagnoses: Patient Active Problem List   Diagnosis Date Noted   Primary localized osteoarthritis of right knee 11/11/2023   Osteoarthritis of right knee 08/21/2023   Syncope 12/02/2022   Hypokalemia 12/02/2022   Fall at home, initial encounter 12/02/2022   SDH (subdural hematoma) (HCC) 12/02/2022   Subdural hematoma (HCC) 12/01/2022   Anemia 08/29/2021   DOE (dyspnea on exertion) 08/22/2021   Primary localized osteoarthritis of right hip 08/14/2021   Status post hip replacement, right 08/14/2021   Unilateral primary osteoarthritis, left knee 02/13/2021   Obesity (BMI 30.0-34.9) 06/21/2020   Sinus bradycardia 04/12/2020   Chronic heart failure with preserved ejection fraction (HFpEF) (HCC) 04/12/2020   Persistent atrial fibrillation (HCC) 03/22/2019   A-fib (HCC) 03/22/2019   Stroke (cerebrum) (HCC) 12/30/2017   Dizziness 06/06/2017   Overactive bladder 06/06/2017   Abnormal mammogram 05/01/2017   Hyperparathyroidism (HCC) 02/12/2017   Vitamin D  deficiency 08/14/2016   Osteoporosis 08/14/2016   Hyperlipidemia 08/14/2016   Atherosclerosis of coronary artery 01/02/2016   Hypercalcemia 03/29/2015   Multiple pulmonary nodules 12/30/2013   Lumbar spinal stenosis 12/22/2013   Arthropathy of facet joint  10/08/2013   Atrial fibrillation (HCC) 03/04/2013   Sinus node dysfunction (HCC) 03/04/2013   Pacemaker-Medtronic 03/04/2013   OSA on CPAP 02/23/2013   Anxiety and depression 02/23/2013   Chronic gastritis 02/23/2013   Essential hypertension, benign 02/23/2013   Urinary incontinence 02/23/2013    Orientation RESPIRATION BLADDER Height & Weight     Self, Time, Situation, Place  Normal (Regular) Continent Weight: 210 lb 3.3 oz (95.4 kg) Height:  5\' 8"  (172.7 cm)  BEHAVIORAL SYMPTOMS/MOOD NEUROLOGICAL BOWEL NUTRITION STATUS      Continent Diet (Regular)  AMBULATORY STATUS COMMUNICATION OF NEEDS Skin   Limited Assist Verbally Surgical wounds                       Personal Care Assistance Level of Assistance  Bathing, Dressing Bathing Assistance: Limited assistance   Dressing Assistance: Limited assistance     Functional Limitations Info  Sight, Hearing, Speech Sight Info: Adequate Hearing Info: Adequate Speech Info: Adequate    SPECIAL CARE FACTORS FREQUENCY  PT (By licensed PT), OT (By licensed OT)     PT Frequency: x5/week OT Frequency: x5/week            Contractures Contractures Info: Not present    Additional Factors Info  Code Status Code Status Info: Full             Current Medications (11/11/2023):  This is the current hospital active medication list Current Facility-Administered Medications  Medication Dose Route Frequency Provider Last Rate Last Admin   0.9 %  sodium chloride  infusion (Manually program via Guardrails IV Fluids)    Continuous PRN Dalldorf, Peter, MD   3,000  mL at 11/11/23 0903   0.9 % irrigation (POUR BTL)    PRN Dalldorf, Peter, MD   1,000 mL at 11/11/23 1610   acetaminophen  (OFIRMEV ) IV 1,000 mg  1,000 mg Intravenous Once PRN Rosalita Combe, MD       acetaminophen  (TYLENOL ) 500 MG tablet            acetaminophen  (TYLENOL ) tablet 500 mg  500 mg Oral Q6H Nida, Andrew, PA-C   500 mg at 11/11/23 1439   bupivacaine  liposome  (EXPAREL ) 1.3 % injection 266 mg  20 mL Other Once Dalldorf, Peter, MD       bupivacaine  liposome (EXPAREL ) 1.3 % injection    PRN Dalldorf, Peter, MD   20 mL at 11/11/23 0909   bupivacaine -EPINEPHrine  (MARCAINE  W/ EPI) 0.5% -1:200000 (with pres) injection    PRN Dalldorf, Peter, MD   30 mL at 11/11/23 0909   ceFAZolin  (ANCEF ) 2-4 GM/100ML-% IVPB            ceFAZolin  (ANCEF ) IVPB 2g/100 mL premix  2 g Intravenous Q6H Nida, Andrew, PA-C       fentaNYL  (SUBLIMAZE ) injection 25-50 mcg  25-50 mcg Intravenous Q5 min PRN Rosalita Combe, MD       lactated ringers  infusion   Intravenous Continuous Ellender, Tommie Frame, MD       lactated ringers  infusion   Intravenous Continuous Dalldorf, Peter, MD 50 mL/hr at 11/11/23 9604 New Bag at 11/11/23 0933   methocarbamol  (ROBAXIN ) tablet 500 mg  500 mg Oral Q6H PRN Lucrezia Sachs, PA-C       Or   methocarbamol  (ROBAXIN ) injection 500 mg  500 mg Intravenous Q6H PRN Lucrezia Sachs, PA-C       ondansetron  (ZOFRAN ) injection 4 mg  4 mg Intravenous Once PRN Rosalita Combe, MD       povidone-iodine  10 % swab 2 Application  2 Application Topical Once Dalldorf, Peter, MD       sodium chloride  (PF) 0.9 % injection    PRN Dalldorf, Peter, MD   30 mL at 11/11/23 0909   sterile water  for irrigation    PRN Dalldorf, Peter, MD   2,000 mL at 11/11/23 0808   tranexamic acid  (CYKLOKAPRON ) 1000MG /141mL IVPB            tranexamic acid  (CYKLOKAPRON ) 2,000 mg in sodium chloride  0.9 % 50 mL Topical Application  2,000 mg Topical On Call to OR Dalldorf, Peter, MD       tranexamic acid  (CYKLOKAPRON ) 2,000 mg in sodium chloride  0.9 % 50 mL Topical Application    PRN Dalldorf, Peter, MD   2,000 mg at 11/11/23 0903   Current Outpatient Medications  Medication Sig Dispense Refill   acetaminophen  (TYLENOL ) 500 MG tablet Take 500 mg by mouth 2 (two) times daily.     alendronate  (FOSAMAX ) 70 MG tablet Take 1 tablet (70 mg total) by mouth every 7 (seven) days. Take with a full glass of water  on  an empty stomach. (Patient taking differently: Take 70 mg by mouth every Wednesday. Take with a full glass of water  on an empty stomach.) 4 tablet 11   apixaban  (ELIQUIS ) 5 MG TABS tablet Take 1 tablet (5 mg total) by mouth 2 (two) times daily. 180 tablet 3   atorvastatin  (LIPITOR) 40 MG tablet Take 40 mg by mouth in the morning.     B Complex-C (B-COMPLEX WITH VITAMIN C) tablet Take 1 tablet by mouth in the morning.     Cholecalciferol  (VITAMIN  D3) 125 MCG (5000 UT) TABS Take 5,000 Units by mouth daily with breakfast.     dapagliflozin  propanediol (FARXIGA ) 10 MG TABS tablet Take 1 tablet (10 mg total) by mouth daily before breakfast. 90 tablet 3   digoxin  (LANOXIN ) 0.125 MG tablet Take 1 tablet (0.125 mg total) by mouth daily. 90 tablet 3   furosemide  (LASIX ) 40 MG tablet Take 2 tablets (80 mg total) by mouth every other day. TAKE 80 MG ALTERNATING 40 MG EVERY OTHER DAY (Patient taking differently: Take 40-80 mg by mouth every other day. TAKE 80 MG ALTERNATING 40 MG EVERY OTHER DAY) 90 tablet 2   HYDROcodone -acetaminophen  (NORCO/VICODIN) 5-325 MG tablet Take 1 tablet by mouth every 6 (six) hours as needed for moderate pain (pain score 4-6) or severe pain (pain score 7-10) (post op pain). 40 tablet 0   levothyroxine  (SYNTHROID ) 88 MCG tablet Take 88 mcg by mouth daily before breakfast.     Lutein  20 MG TABS Take 20 mg by mouth daily at 12 noon.     magnesium  oxide (MAG-OX) 400 MG tablet Take 1 tablet (400 mg total) by mouth daily. 30 tablet 0   Melatonin 10 MG TABS Take 10-40 mg by mouth at bedtime as needed (sleep).     metoprolol  succinate (TOPROL -XL) 50 MG 24 hr tablet Take 2 tablets by  mouth with or following a meal 3 times daily (Patient taking differently: Take 50 mg by mouth in the morning, at noon, and at bedtime.) 540 tablet 0   Multiple Vitamin (MULTIVITAMIN WITH MINERALS) TABS tablet Take 1 tablet by mouth daily at 12 noon.     Multiple Vitamins-Minerals (PRESERVISION AREDS 2 PO) Take 1  capsule by mouth 2 (two) times daily with a meal.      sertraline  (ZOLOFT ) 50 MG tablet Take 1 tablet (50 mg total) by mouth daily. 30 tablet 2   spironolactone  (ALDACTONE ) 25 MG tablet Take 1 tablet (25 mg total) by mouth daily. (Patient taking differently: Take 25 mg by mouth daily at 12 noon.) 90 tablet 3   tiZANidine  (ZANAFLEX ) 2 MG tablet Take 1 tablet (2 mg total) by mouth every 6 (six) hours as needed for muscle spasms. 40 tablet 1     Discharge Medications: Please see discharge summary for a list of discharge medications.  Relevant Imaging Results:  Relevant Lab Results:   Additional Information SSN: [id number]  Jabier Martens, LCSW

## 2023-11-11 NOTE — Progress Notes (Signed)
 Physical Therapy Treatment Patient Details Name: Brittany [name] MRN: [id number] DOB: [birth year] Today's Date: 11/11/2023   History of Present Illness 89 yo female s/P RTKA. PMH: stroke, PPM, macular degeneration, RTHA, LTKA    PT Comments  The patient demonstrates stronger quads this visit and ambulated x  30' then 10' x 2. Patient is safe for transfers by staff and will have staff at facility to transfer from car. Patient to DC to rehab.      If plan is discharge home, recommend the following: Two people to help with walking and/or transfers;A little help with bathing/dressing/bathroom;Assist for transportation;Help with stairs or ramp for entrance   Can travel by private vehicle     No  Equipment Recommendations  None recommended by PT    Recommendations for Other Services       Precautions / Restrictions Precautions Precautions: Fall Precaution/Restrictions Comments: has had 3 recent falls Restrictions Weight Bearing Restrictions Per Provider Order: No     Mobility  Bed Mobility Overal bed mobility: Needs Assistance Bed Mobility: Supine to Sit     Supine to sit: Contact guard Sit to supine: Min assist   General bed mobility comments: assist right  LE    Transfers Overall transfer level: Needs assistance Equipment used: Rolling walker (2 wheels) Transfers: Sit to/from Stand Sit to Stand: Min assist           General transfer comment: support provided at the right knee with WB for caution.    Ambulation/Gait Ambulation/Gait assistance: Min assist, +2 safety/equipment Gait Distance (Feet): 30 Feet (the 10' x 2) Assistive device: Rolling walker (2 wheels) Gait Pattern/deviations: Step-to pattern Gait velocity: decr     General Gait Details: cues for position inside RW   Stairs             Wheelchair Mobility     Tilt Bed    Modified Rankin (Stroke Patients Only)       Balance Overall balance assessment: History of Falls, Needs  assistance Sitting-balance support: Feet supported, Bilateral upper extremity supported Sitting balance-Leahy Scale: Fair     Standing balance support: During functional activity, Bilateral upper extremity supported, Reliant on assistive device for balance Standing balance-Leahy Scale: Poor                              Communication Communication Communication: No apparent difficulties  Cognition Arousal: Alert Behavior During Therapy: WFL for tasks assessed/performed   PT - Cognitive impairments: No apparent impairments                         Following commands: Intact      Cueing    Exercises      General Comments        Pertinent Vitals/Pain Pain Assessment Pain Assessment: (P) Faces Faces Pain Scale: Hurts a little bit Pain Location: right knee Pain Descriptors / Indicators: Discomfort Pain Intervention(s): Premedicated before session    Home Living Family/patient expects to be discharged to:: Skilled nursing facility Living Arrangements: Spouse/significant other Available Help at Discharge: Family;Available 24 hours/day Type of Home: Apartment Home Access: Level entry       Home Layout: One level Home Equipment: Agricultural consultant (2 wheels);Rollator (4 wheels);Grab bars - tub/shower;Hand held shower head Additional Comments: [facility] will be DC plan    Prior Function            PT  Goals (current goals can now be found in the care plan section) Acute Rehab PT Goals Patient Stated Goal: go home PT Goal Formulation: With patient/family Time For Goal Achievement: 11/25/23 Potential to Achieve Goals: Good Progress towards PT goals: Progressing toward goals    Frequency    7X/week      PT Plan      Co-evaluation              AM-PAC PT "6 Clicks" Mobility   Outcome Measure  Help needed turning from your back to your side while in a flat bed without using bedrails?: A Little Help needed moving from lying  on your back to sitting on the side of a flat bed without using bedrails?: A Little Help needed moving to and from a bed to a chair (including a wheelchair)?: A Lot Help needed standing up from a chair using your arms (e.g., wheelchair or bedside chair)?: A Lot Help needed to walk in hospital room?: A Lot Help needed climbing 3-5 steps with a railing? : Total 6 Click Score: 13    End of Session Equipment Utilized During Treatment: Gait belt Activity Tolerance: Patient tolerated treatment well Patient left: in chair;with call bell/phone within reach;with family/visitor present Nurse Communication: Mobility status PT Visit Diagnosis: Unsteadiness on feet (R26.81);Muscle weakness (generalized) (M62.81);Difficulty in walking, not elsewhere classified (R26.2)     Time: 6045-4098 PT Time Calculation (min) (ACUTE ONLY): 33 min  Charges:    $Gait Training: 8-22 mins  $Self Care/Home Management: 8-22 PT General Charges $$ ACUTE PT VISIT: 1 Visit                     Abelina Hoes PT Acute Rehabilitation Services Office [phone] Weekend [phone]    Dareen Ebbing 11/11/2023, 3:41 PM

## 2023-11-11 NOTE — Anesthesia Procedure Notes (Signed)
 Anesthesia Regional Block: Adductor canal block   Pre-Anesthetic Checklist: , timeout performed,  Correct Patient, Correct Site, Correct Laterality,  Correct Procedure, Correct Position, site marked,  Risks and benefits discussed,  Surgical consent,  Pre-op evaluation,  At surgeon's request and post-op pain management  Laterality: Lower and Right  Prep: chloraprep       Needles:  Injection technique: Single-shot  Needle Type: Echogenic Needle     Needle Length: 9cm  Needle Gauge: 22     Additional Needles:   Procedures:,,,, ultrasound used (permanent image in chart),,    Narrative:  Start time: 11/11/2023 6:55 AM End time: 11/11/2023 7:02 AM Injection made incrementally with aspirations every 5 mL.  Performed by: Personally  Anesthesiologist: Rosalita Combe, MD  Additional Notes: Block assessed prior to surgery. Pt tolerated procedure well.

## 2023-11-12 ENCOUNTER — Encounter (HOSPITAL_COMMUNITY): Payer: Self-pay | Admitting: Orthopaedic Surgery

## 2023-11-12 DIAGNOSIS — E039 Hypothyroidism, unspecified: Secondary | ICD-10-CM | POA: Diagnosis not present

## 2023-11-12 DIAGNOSIS — Z96651 Presence of right artificial knee joint: Secondary | ICD-10-CM | POA: Diagnosis not present

## 2023-11-12 DIAGNOSIS — I4891 Unspecified atrial fibrillation: Secondary | ICD-10-CM | POA: Diagnosis not present

## 2023-11-12 DIAGNOSIS — R262 Difficulty in walking, not elsewhere classified: Secondary | ICD-10-CM | POA: Diagnosis not present

## 2023-11-12 DIAGNOSIS — M25561 Pain in right knee: Secondary | ICD-10-CM | POA: Diagnosis not present

## 2023-11-12 DIAGNOSIS — R41841 Cognitive communication deficit: Secondary | ICD-10-CM | POA: Diagnosis not present

## 2023-11-12 DIAGNOSIS — Z471 Aftercare following joint replacement surgery: Secondary | ICD-10-CM | POA: Diagnosis not present

## 2023-11-12 DIAGNOSIS — E559 Vitamin D deficiency, unspecified: Secondary | ICD-10-CM | POA: Diagnosis not present

## 2023-11-12 DIAGNOSIS — Z96659 Presence of unspecified artificial knee joint: Secondary | ICD-10-CM | POA: Diagnosis not present

## 2023-11-12 DIAGNOSIS — N189 Chronic kidney disease, unspecified: Secondary | ICD-10-CM | POA: Diagnosis not present

## 2023-11-13 ENCOUNTER — Telehealth: Payer: Self-pay | Admitting: Internal Medicine

## 2023-11-13 DIAGNOSIS — R41841 Cognitive communication deficit: Secondary | ICD-10-CM | POA: Diagnosis not present

## 2023-11-13 DIAGNOSIS — F4320 Adjustment disorder, unspecified: Secondary | ICD-10-CM | POA: Diagnosis not present

## 2023-11-13 DIAGNOSIS — I509 Heart failure, unspecified: Secondary | ICD-10-CM | POA: Diagnosis not present

## 2023-11-13 DIAGNOSIS — I4891 Unspecified atrial fibrillation: Secondary | ICD-10-CM | POA: Diagnosis not present

## 2023-11-13 DIAGNOSIS — R262 Difficulty in walking, not elsewhere classified: Secondary | ICD-10-CM | POA: Diagnosis not present

## 2023-11-13 DIAGNOSIS — Z96651 Presence of right artificial knee joint: Secondary | ICD-10-CM | POA: Diagnosis not present

## 2023-11-13 DIAGNOSIS — Z8673 Personal history of transient ischemic attack (TIA), and cerebral infarction without residual deficits: Secondary | ICD-10-CM | POA: Diagnosis not present

## 2023-11-13 DIAGNOSIS — K5901 Slow transit constipation: Secondary | ICD-10-CM | POA: Diagnosis not present

## 2023-11-13 DIAGNOSIS — T3995XA Adverse effect of unspecified nonopioid analgesic, antipyretic and antirheumatic, initial encounter: Secondary | ICD-10-CM | POA: Diagnosis not present

## 2023-11-13 DIAGNOSIS — D72829 Elevated white blood cell count, unspecified: Secondary | ICD-10-CM | POA: Diagnosis not present

## 2023-11-13 DIAGNOSIS — Z471 Aftercare following joint replacement surgery: Secondary | ICD-10-CM | POA: Diagnosis not present

## 2023-11-13 DIAGNOSIS — I495 Sick sinus syndrome: Secondary | ICD-10-CM | POA: Diagnosis not present

## 2023-11-13 DIAGNOSIS — M1711 Unilateral primary osteoarthritis, right knee: Secondary | ICD-10-CM | POA: Diagnosis not present

## 2023-11-13 DIAGNOSIS — I2510 Atherosclerotic heart disease of native coronary artery without angina pectoris: Secondary | ICD-10-CM | POA: Diagnosis not present

## 2023-11-13 NOTE — Telephone Encounter (Signed)
 Attempted phone call to Brittany [name].  No answer and no voicemail.  See excerpt from Dr Doyle Generous OV note below from 11/29/22    I was caught off guard by her metoprolol  regime. Taking 100 mg 3 times a day. Going back through the records, I found out that I had written to increase it from 100-150 twice daily and that that has been translated into 100 3 times daily. No worries, she tolerates it well.

## 2023-11-13 NOTE — Telephone Encounter (Signed)
 Spoke with [facility] and they are calling to verify instructions for metoprool 50 mg XL

## 2023-11-13 NOTE — Telephone Encounter (Signed)
 Pt c/o medication issue:  1. Name of Medication: metoprolol  succinate (TOPROL -XL) 50 MG 24 hr tablet   2. How are you currently taking this medication (dosage and times per day)? N/A  3. Are you having a reaction (difficulty breathing--STAT)? No  4. What is your medication issue? Pts community home calling to receive clear instructions in regards to this medication. Please advise

## 2023-11-14 ENCOUNTER — Telehealth: Payer: Self-pay | Admitting: Internal Medicine

## 2023-11-14 DIAGNOSIS — Z471 Aftercare following joint replacement surgery: Secondary | ICD-10-CM | POA: Diagnosis not present

## 2023-11-14 DIAGNOSIS — R262 Difficulty in walking, not elsewhere classified: Secondary | ICD-10-CM | POA: Diagnosis not present

## 2023-11-14 DIAGNOSIS — Z96651 Presence of right artificial knee joint: Secondary | ICD-10-CM | POA: Diagnosis not present

## 2023-11-14 DIAGNOSIS — R41841 Cognitive communication deficit: Secondary | ICD-10-CM | POA: Diagnosis not present

## 2023-11-14 NOTE — Telephone Encounter (Signed)
 Pt c/o medication issue:  1. Name of Medication:   metoprolol  succinate (TOPROL -XL) 50 MG 24 hr tablet    2. How are you currently taking this medication (dosage and times per day)?   3. Are you having a reaction (difficulty breathing--STAT)?   4. What is your medication issue? Pharmacy wants to make sure the dosage is correct since this a extended release tab and we are wanting her to take it 3 times a day. Please Advise

## 2023-11-14 NOTE — Telephone Encounter (Signed)
 Attempted to call Brittany [name] x2, no answer left message requesting call back

## 2023-11-14 NOTE — Telephone Encounter (Signed)
 They are calling to her from the nurse/doctor on this medication. Please advise

## 2023-11-15 DIAGNOSIS — R262 Difficulty in walking, not elsewhere classified: Secondary | ICD-10-CM | POA: Diagnosis not present

## 2023-11-15 DIAGNOSIS — D649 Anemia, unspecified: Secondary | ICD-10-CM | POA: Diagnosis not present

## 2023-11-15 DIAGNOSIS — R41841 Cognitive communication deficit: Secondary | ICD-10-CM | POA: Diagnosis not present

## 2023-11-15 DIAGNOSIS — Z96651 Presence of right artificial knee joint: Secondary | ICD-10-CM | POA: Diagnosis not present

## 2023-11-15 DIAGNOSIS — Z471 Aftercare following joint replacement surgery: Secondary | ICD-10-CM | POA: Diagnosis not present

## 2023-11-17 DIAGNOSIS — R41841 Cognitive communication deficit: Secondary | ICD-10-CM | POA: Diagnosis not present

## 2023-11-17 DIAGNOSIS — Z471 Aftercare following joint replacement surgery: Secondary | ICD-10-CM | POA: Diagnosis not present

## 2023-11-17 DIAGNOSIS — R262 Difficulty in walking, not elsewhere classified: Secondary | ICD-10-CM | POA: Diagnosis not present

## 2023-11-17 DIAGNOSIS — Z96651 Presence of right artificial knee joint: Secondary | ICD-10-CM | POA: Diagnosis not present

## 2023-11-17 MED ORDER — METOPROLOL SUCCINATE ER 100 MG PO TB24: 100.0000 mg | ORAL_TABLET | Freq: Two times a day (BID) | ORAL | 2 refills | Status: DC

## 2023-11-17 NOTE — Telephone Encounter (Signed)
 Lauralee Poll, MD  You4 hours ago (12:54 PM)    Succinate BID should be fine, thanks!    Received call from Laurine  Informed Laurine per Dr. Alease Amend decrease to Metoprolol  Succinate 100 mg BID  Laurine verbalized understanding, no questions at this time

## 2023-11-18 ENCOUNTER — Emergency Department (HOSPITAL_COMMUNITY)
Admission: EM | Admit: 2023-11-18 | Discharge: 2023-11-18 | Disposition: A | Discharge status: Home / Self Care | Attending: Emergency Medicine | Admitting: Emergency Medicine

## 2023-11-18 ENCOUNTER — Other Ambulatory Visit: Payer: Self-pay

## 2023-11-18 DIAGNOSIS — R41841 Cognitive communication deficit: Secondary | ICD-10-CM | POA: Diagnosis not present

## 2023-11-18 DIAGNOSIS — R262 Difficulty in walking, not elsewhere classified: Secondary | ICD-10-CM | POA: Diagnosis not present

## 2023-11-18 DIAGNOSIS — Z471 Aftercare following joint replacement surgery: Secondary | ICD-10-CM | POA: Diagnosis not present

## 2023-11-18 DIAGNOSIS — D649 Anemia, unspecified: Secondary | ICD-10-CM | POA: Insufficient documentation

## 2023-11-18 DIAGNOSIS — I959 Hypotension, unspecified: Secondary | ICD-10-CM | POA: Diagnosis not present

## 2023-11-18 DIAGNOSIS — R799 Abnormal finding of blood chemistry, unspecified: Secondary | ICD-10-CM | POA: Diagnosis present

## 2023-11-18 DIAGNOSIS — Z96642 Presence of left artificial hip joint: Secondary | ICD-10-CM | POA: Diagnosis not present

## 2023-11-18 DIAGNOSIS — Z96651 Presence of right artificial knee joint: Secondary | ICD-10-CM | POA: Diagnosis not present

## 2023-11-18 DIAGNOSIS — R7889 Finding of other specified substances, not normally found in blood: Secondary | ICD-10-CM | POA: Diagnosis not present

## 2023-11-18 LAB — CBC WITH DIFFERENTIAL/PLATELET
Abs Immature Granulocytes: 0.19 10*3/uL — ABNORMAL HIGH (ref 0.00–0.07)
Basophils Absolute: 0 10*3/uL (ref 0.0–0.1)
Basophils Relative: 0 %
Eosinophils Absolute: 0.2 10*3/uL (ref 0.0–0.5)
Eosinophils Relative: 1 %
HCT: 28.8 % — ABNORMAL LOW (ref 36.0–46.0)
Hemoglobin: 8.9 g/dL — ABNORMAL LOW (ref 12.0–15.0)
Immature Granulocytes: 1 %
Lymphocytes Relative: 7 %
Lymphs Abs: 1 10*3/uL (ref 0.7–4.0)
MCH: 31.9 pg (ref 26.0–34.0)
MCHC: 30.9 g/dL (ref 30.0–36.0)
MCV: 103.2 fL — ABNORMAL HIGH (ref 80.0–100.0)
Monocytes Absolute: 1.8 10*3/uL — ABNORMAL HIGH (ref 0.1–1.0)
Monocytes Relative: 14 %
Neutro Abs: 10.3 10*3/uL — ABNORMAL HIGH (ref 1.7–7.7)
Neutrophils Relative %: 77 %
Platelets: 441 10*3/uL — ABNORMAL HIGH (ref 150–400)
RBC: 2.79 MIL/uL — ABNORMAL LOW (ref 3.87–5.11)
RDW: 14.3 % (ref 11.5–15.5)
WBC: 13.5 10*3/uL — ABNORMAL HIGH (ref 4.0–10.5)
nRBC: 0 % (ref 0.0–0.2)

## 2023-11-18 LAB — COMPREHENSIVE METABOLIC PANEL WITH GFR
ALT: 39 U/L (ref 0–44)
AST: 36 U/L (ref 15–41)
Albumin: 3.2 g/dL — ABNORMAL LOW (ref 3.5–5.0)
Alkaline Phosphatase: 105 U/L (ref 38–126)
Anion gap: 9 (ref 5–15)
BUN: 16 mg/dL (ref 8–23)
CO2: 26 mmol/L (ref 22–32)
Calcium: 10 mg/dL (ref 8.9–10.3)
Chloride: 98 mmol/L (ref 98–111)
Creatinine, Ser: 0.89 mg/dL (ref 0.44–1.00)
GFR, Estimated: >60 mL/min (ref >60)
Glucose, Bld: 110 mg/dL — ABNORMAL HIGH (ref 70–99)
Potassium: 4.4 mmol/L (ref 3.5–5.1)
Sodium: 133 mmol/L — ABNORMAL LOW (ref 135–145)
Total Bilirubin: 1.2 mg/dL (ref 0.0–1.2)
Total Protein: 6.6 g/dL (ref 6.5–8.1)

## 2023-11-18 LAB — TYPE AND SCREEN
ABO/RH(D): A POS
Antibody Screen: NEGATIVE

## 2023-11-18 NOTE — ED Provider Notes (Signed)
  [place] EMERGENCY DEPARTMENT AT [facility] Provider Note   CSN: [id number] Arrival date & time: 11/18/23  1613     History No chief complaint on file.   HPI Brittany [name] is a 89 y.o. female presenting for ***.   Patient's recorded medical, surgical, social, medication list and allergies were reviewed in the Snapshot window as part of the initial history.   Review of Systems   Review of Systems  Physical Exam Updated Vital Signs LMP 07/22/1968  Physical Exam   ED Course/ Medical Decision Making/ A&P    Procedures Procedures   Medications Ordered in ED Medications - No data to display  Medical Decision Making:   Brittany [name] is a 89 y.o. female who presented to the ED today with *** detailed above.    {crccomplexity:27900} Complete initial physical exam performed, notably the patient  was ***.    Reviewed and confirmed nursing documentation for past medical history, family history, social history.    Initial Assessment:   With the patient's presentation of ***, most likely diagnosis is ***. Other diagnoses were considered including (but not limited to) ***. These are considered less likely due to history of present illness and physical exam findings.   {crccopa:27899}  Initial Plan:  ***  ***Screening labs including CBC and Metabolic panel to evaluate for infectious or metabolic etiology of disease.  ***Urinalysis with reflex culture ordered to evaluate for UTI or relevant urologic/nephrologic pathology.  ***CXR to evaluate for structural/infectious intrathoracic pathology.  {crccardiactesting:32591::"EKG to evaluate for cardiac pathology"} Objective evaluation as below reviewed   Initial Study Results:   Laboratory  All laboratory results reviewed without evidence of clinically relevant pathology.   ***Exceptions include: ***   ***EKG EKG was reviewed independently. Rate, rhythm, axis, intervals all examined and without medically  relevant abnormality. ST segments without concerns for elevations.    Radiology:  All images reviewed independently. ***Agree with radiology report at this time.   No results found.    Consults: Case discussed with ***.   Reassessment and Plan:   ***    ***  Clinical Impression: No diagnosis found.   Data Unavailable   Final Clinical Impression(s) / ED Diagnoses Final diagnoses:  None    Rx / DC Orders ED Discharge Orders     None

## 2023-11-18 NOTE — ED Notes (Signed)
 Spoke with [facility] staff, they are aware pt spouse is bringing her back. D/c instructions reviewed, dnr and paperwork sent back with pt spouse.

## 2023-11-18 NOTE — ED Triage Notes (Addendum)
 Pt presents to ED via EMS from [facility] facility for low hemoglobin 11.5 on 23rd and today hgb of 8.6. Per EMS pt had a knee replacement last week as well, pt presents at baseline Aox4 currently upon arrival. Pt denies SOB, weakness and chest pain  BP122/60 Hr 68 O2 97% Temp 98% room air

## 2023-11-18 NOTE — Telephone Encounter (Signed)
 Spoke with second shift nurse as [facility] is now off and advised of Dr Doyle Generous last note.  Pt's nurse states Dr Alease Amend gave instructions yesterday to facility on 11/17/2023 that pt should be on Metoprolol  100mg  bid. Facility nurse thanked Charity fundraiser for the call.

## 2023-11-18 NOTE — Discharge Instructions (Signed)
 HGB at 8.9 today. Continue to trend.

## 2023-11-18 NOTE — ED Provider Notes (Incomplete)
  [place] EMERGENCY DEPARTMENT AT [facility] Provider Note   CSN: [id number] Arrival date & time: 11/18/23  1613     History No chief complaint on file.   HPI Brittany [name] is a 89 y.o. female presenting for chief complaint of downtrending hemoglobin.  Recent right knee replacement. States that after her left hip replacement, she ended up with anemia so severe she needed a transfusion.  Hemoglobin 13 before procedure, 11 after, 9 earlier this week and 8.6 today.  She is asymptomatic denies fevers chills nausea vomiting syncope shortness of breath.  Otherwise ambulatory tolerating p.o. intake.   Patient's recorded medical, surgical, social, medication list and allergies were reviewed in the Snapshot window as part of the initial history.   Review of Systems   Review of Systems  Constitutional:  Negative for chills and fever.  HENT:  Negative for ear pain and sore throat.   Eyes:  Negative for pain and visual disturbance.  Respiratory:  Negative for cough and shortness of breath.   Cardiovascular:  Negative for chest pain and palpitations.  Gastrointestinal:  Negative for abdominal pain and vomiting.  Genitourinary:  Negative for dysuria and hematuria.  Musculoskeletal:  Negative for arthralgias and back pain.  Skin:  Negative for color change and rash.  Neurological:  Negative for seizures and syncope.  All other systems reviewed and are negative.   Physical Exam Updated Vital Signs LMP 07/22/1968  Physical Exam Vitals and nursing note reviewed.  Constitutional:      General: She is not in acute distress.    Appearance: She is well-developed.  HENT:     Head: Normocephalic and atraumatic.  Eyes:     Conjunctiva/sclera: Conjunctivae normal.  Cardiovascular:     Rate and Rhythm: Normal rate and regular rhythm.     Heart sounds: No murmur heard. Pulmonary:     Effort: Pulmonary effort is normal. No respiratory distress.     Breath sounds: Normal breath  sounds.  Abdominal:     General: There is no distension.     Palpations: Abdomen is soft.     Tenderness: There is no abdominal tenderness. There is no right CVA tenderness or left CVA tenderness.  Musculoskeletal:        General: No swelling or tenderness. Normal range of motion.     Cervical back: Neck supple.  Skin:    General: Skin is warm and dry.  Neurological:     General: No focal deficit present.     Mental Status: She is alert and oriented to person, place, and time. Mental status is at baseline.     Cranial Nerves: No cranial nerve deficit.      ED Course/ Medical Decision Making/ A&P    Procedures Procedures   Medications Ordered in ED Medications - No data to display  Medical Decision Making:    Clinical Impression: No diagnosis found.   Data Unavailable   Final Clinical Impression(s) / ED Diagnoses Final diagnoses:  None    Rx / DC Orders ED Discharge Orders     None

## 2023-11-19 DIAGNOSIS — R41841 Cognitive communication deficit: Secondary | ICD-10-CM | POA: Diagnosis not present

## 2023-11-19 DIAGNOSIS — Z96651 Presence of right artificial knee joint: Secondary | ICD-10-CM | POA: Diagnosis not present

## 2023-11-19 DIAGNOSIS — D649 Anemia, unspecified: Secondary | ICD-10-CM | POA: Diagnosis not present

## 2023-11-19 DIAGNOSIS — Z471 Aftercare following joint replacement surgery: Secondary | ICD-10-CM | POA: Diagnosis not present

## 2023-11-19 DIAGNOSIS — L03115 Cellulitis of right lower limb: Secondary | ICD-10-CM | POA: Diagnosis not present

## 2023-11-19 DIAGNOSIS — R262 Difficulty in walking, not elsewhere classified: Secondary | ICD-10-CM | POA: Diagnosis not present

## 2023-11-20 ENCOUNTER — Encounter (HOSPITAL_COMMUNITY): Payer: Self-pay

## 2023-11-20 ENCOUNTER — Emergency Department (HOSPITAL_COMMUNITY)
Admission: EM | Admit: 2023-11-20 | Discharge: 2023-11-20 | Disposition: A | Discharge status: Home / Self Care | Attending: Emergency Medicine | Admitting: Emergency Medicine

## 2023-11-20 ENCOUNTER — Inpatient Hospital Stay: Admission: RE | Admit: 2023-11-20 | Source: Ambulatory Visit

## 2023-11-20 ENCOUNTER — Emergency Department (HOSPITAL_COMMUNITY)

## 2023-11-20 DIAGNOSIS — N3000 Acute cystitis without hematuria: Secondary | ICD-10-CM | POA: Insufficient documentation

## 2023-11-20 DIAGNOSIS — D649 Anemia, unspecified: Secondary | ICD-10-CM | POA: Diagnosis not present

## 2023-11-20 DIAGNOSIS — X58XXXA Exposure to other specified factors, initial encounter: Secondary | ICD-10-CM | POA: Insufficient documentation

## 2023-11-20 DIAGNOSIS — I10 Essential (primary) hypertension: Secondary | ICD-10-CM | POA: Diagnosis not present

## 2023-11-20 DIAGNOSIS — R262 Difficulty in walking, not elsewhere classified: Secondary | ICD-10-CM | POA: Diagnosis not present

## 2023-11-20 DIAGNOSIS — I517 Cardiomegaly: Secondary | ICD-10-CM | POA: Diagnosis not present

## 2023-11-20 DIAGNOSIS — Z96651 Presence of right artificial knee joint: Secondary | ICD-10-CM | POA: Diagnosis not present

## 2023-11-20 DIAGNOSIS — M79604 Pain in right leg: Secondary | ICD-10-CM | POA: Diagnosis not present

## 2023-11-20 DIAGNOSIS — Z950 Presence of cardiac pacemaker: Secondary | ICD-10-CM | POA: Diagnosis not present

## 2023-11-20 DIAGNOSIS — Z79899 Other long term (current) drug therapy: Secondary | ICD-10-CM | POA: Insufficient documentation

## 2023-11-20 DIAGNOSIS — I4891 Unspecified atrial fibrillation: Secondary | ICD-10-CM | POA: Insufficient documentation

## 2023-11-20 DIAGNOSIS — R4182 Altered mental status, unspecified: Secondary | ICD-10-CM | POA: Insufficient documentation

## 2023-11-20 DIAGNOSIS — R410 Disorientation, unspecified: Secondary | ICD-10-CM | POA: Diagnosis not present

## 2023-11-20 DIAGNOSIS — D72829 Elevated white blood cell count, unspecified: Secondary | ICD-10-CM | POA: Diagnosis not present

## 2023-11-20 DIAGNOSIS — S8001XA Contusion of right knee, initial encounter: Secondary | ICD-10-CM | POA: Diagnosis not present

## 2023-11-20 DIAGNOSIS — L03115 Cellulitis of right lower limb: Secondary | ICD-10-CM | POA: Diagnosis not present

## 2023-11-20 DIAGNOSIS — I959 Hypotension, unspecified: Secondary | ICD-10-CM | POA: Diagnosis not present

## 2023-11-20 DIAGNOSIS — Z471 Aftercare following joint replacement surgery: Secondary | ICD-10-CM | POA: Diagnosis not present

## 2023-11-20 DIAGNOSIS — I700 Atherosclerosis of aorta: Secondary | ICD-10-CM | POA: Diagnosis not present

## 2023-11-20 DIAGNOSIS — M25561 Pain in right knee: Secondary | ICD-10-CM | POA: Diagnosis present

## 2023-11-20 DIAGNOSIS — Z7901 Long term (current) use of anticoagulants: Secondary | ICD-10-CM | POA: Diagnosis not present

## 2023-11-20 DIAGNOSIS — E039 Hypothyroidism, unspecified: Secondary | ICD-10-CM | POA: Diagnosis not present

## 2023-11-20 DIAGNOSIS — R5383 Other fatigue: Secondary | ICD-10-CM | POA: Diagnosis not present

## 2023-11-20 LAB — CBC WITH DIFFERENTIAL/PLATELET
Abs Immature Granulocytes: 0.2 10*3/uL — ABNORMAL HIGH (ref 0.00–0.07)
Basophils Absolute: 0.1 10*3/uL (ref 0.0–0.1)
Basophils Relative: 0 %
Eosinophils Absolute: 0.3 10*3/uL (ref 0.0–0.5)
Eosinophils Relative: 2 %
HCT: 29.3 % — ABNORMAL LOW (ref 36.0–46.0)
Hemoglobin: 9.1 g/dL — ABNORMAL LOW (ref 12.0–15.0)
Immature Granulocytes: 2 %
Lymphocytes Relative: 6 %
Lymphs Abs: 0.8 10*3/uL (ref 0.7–4.0)
MCH: 31.6 pg (ref 26.0–34.0)
MCHC: 31.1 g/dL (ref 30.0–36.0)
MCV: 101.7 fL — ABNORMAL HIGH (ref 80.0–100.0)
Monocytes Absolute: 1.3 10*3/uL — ABNORMAL HIGH (ref 0.1–1.0)
Monocytes Relative: 11 %
Neutro Abs: 9.6 10*3/uL — ABNORMAL HIGH (ref 1.7–7.7)
Neutrophils Relative %: 79 %
Platelets: 454 10*3/uL — ABNORMAL HIGH (ref 150–400)
RBC: 2.88 MIL/uL — ABNORMAL LOW (ref 3.87–5.11)
RDW: 14.6 % (ref 11.5–15.5)
WBC: 12.3 10*3/uL — ABNORMAL HIGH (ref 4.0–10.5)
nRBC: 0 % (ref 0.0–0.2)

## 2023-11-20 LAB — COMPREHENSIVE METABOLIC PANEL WITH GFR
ALT: 41 U/L (ref 0–44)
AST: 33 U/L (ref 15–41)
Albumin: 2.9 g/dL — ABNORMAL LOW (ref 3.5–5.0)
Alkaline Phosphatase: 87 U/L (ref 38–126)
Anion gap: 8 (ref 5–15)
BUN: 16 mg/dL (ref 8–23)
CO2: 27 mmol/L (ref 22–32)
Calcium: 10.3 mg/dL (ref 8.9–10.3)
Chloride: 102 mmol/L (ref 98–111)
Creatinine, Ser: 0.91 mg/dL (ref 0.44–1.00)
GFR, Estimated: >60 mL/min (ref >60)
Glucose, Bld: 115 mg/dL — ABNORMAL HIGH (ref 70–99)
Potassium: 4.7 mmol/L (ref 3.5–5.1)
Sodium: 137 mmol/L (ref 135–145)
Total Bilirubin: 1.2 mg/dL (ref 0.0–1.2)
Total Protein: 6.4 g/dL — ABNORMAL LOW (ref 6.5–8.1)

## 2023-11-20 LAB — URINALYSIS, ROUTINE W REFLEX MICROSCOPIC
Bilirubin Urine: NEGATIVE
Glucose, UA: >=500 mg/dL — AB
Hgb urine dipstick: NEGATIVE
Ketones, ur: NEGATIVE mg/dL
Nitrite: NEGATIVE
Protein, ur: NEGATIVE mg/dL
Specific Gravity, Urine: 1.013 (ref 1.005–1.030)
pH: 5 (ref 5.0–8.0)

## 2023-11-20 LAB — CBG MONITORING, ED: Glucose-Capillary: 106 mg/dL — ABNORMAL HIGH (ref 70–99)

## 2023-11-20 LAB — I-STAT CG4 LACTIC ACID, ED
Lactic Acid, Venous: 1.6 mmol/L (ref 0.5–1.9)
Lactic Acid, Venous: 1.4 mmol/L (ref 0.5–1.9)

## 2023-11-20 LAB — DIGOXIN LEVEL: Digoxin Level: 0.6 ng/mL — ABNORMAL LOW (ref 0.8–2.0)

## 2023-11-20 MED ORDER — CEPHALEXIN 500 MG PO CAPS: 500.0000 mg | ORAL_CAPSULE | Freq: Three times a day (TID) | ORAL | 0 refills | Status: DC

## 2023-11-20 MED ORDER — SODIUM CHLORIDE 0.9 % IV BOLUS
500.0000 mL | Freq: Once | INTRAVENOUS | Status: AC
  Administered 2023-11-20: 500 mL via INTRAVENOUS

## 2023-11-20 MED ORDER — SODIUM CHLORIDE 0.9 % IV SOLN
1.0000 g | Freq: Once | INTRAVENOUS | Status: AC
  Administered 2023-11-20: 1 g via INTRAVENOUS
  Filled 2023-11-20: qty 10

## 2023-11-20 NOTE — ED Notes (Signed)
 Patient verbalized understanding of discharge instructions. Husband said he will be driving patient to [place].

## 2023-11-20 NOTE — ED Provider Notes (Signed)
 [place] EMERGENCY DEPARTMENT AT [facility] Provider Note   CSN: [id number] Arrival date & time: 11/20/23  1135     History  Chief Complaint  Patient presents with   Altered Mental Status    Brittany [name] is a 89 y.o. female.  Pt is a 89 yo female with pmhx significant for afib (on Eliquis ), htn, depression, gerd, hypothyroidism, and recent right knee replacement by Dr. Ana Balling on 4/22.  Pt was seen here on 4/29 for anemia s/p surgery.  It was not low enough for transfusion, so she was d/c home.Pt has been having a lot of right knee pain and pt's husband called EMS because she ws confused.  Pt denies any fevers and is awake and alert.       Home Medications Prior to Admission medications   Medication Sig Start Date End Date Taking? Authorizing Provider  acetaminophen  (TYLENOL ) 500 MG tablet Take 500 mg by mouth 2 (two) times daily.    [provider]  alendronate  (FOSAMAX ) 70 MG tablet Take 1 tablet (70 mg total) by mouth every 7 (seven) days. Take with a full glass of water  on an empty stomach. Patient taking differently: Take 70 mg by mouth every Wednesday. Take with a full glass of water  on an empty stomach. 03/04/17   Kuneff, Renee A, DO  apixaban  (ELIQUIS ) 5 MG TABS tablet Take 1 tablet (5 mg total) by mouth 2 (two) times daily. 04/11/23   Verona Goodwill, MD  atorvastatin  (LIPITOR) 40 MG tablet Take 40 mg by mouth in the morning.    [provider]  B Complex-C (B-COMPLEX WITH VITAMIN C) tablet Take 1 tablet by mouth in the morning.    [provider]  cephALEXin  (KEFLEX ) 500 MG capsule Take 1 capsule (500 mg total) by mouth 3 (three) times daily. 11/20/23   Sueellen Emery, MD  Cholecalciferol  (VITAMIN D3) 125 MCG (5000 UT) TABS Take 5,000 Units by mouth daily with breakfast.    [provider]  dapagliflozin  propanediol (FARXIGA ) 10 MG TABS tablet Take 1 tablet (10 mg total) by mouth daily before breakfast. 04/21/23   Verona Goodwill, MD  digoxin  (LANOXIN ) 0.125 MG tablet Take 1 tablet (0.125 mg total) by mouth daily. 05/13/23   Verona Goodwill, MD  furosemide  (LASIX ) 40 MG tablet Take 2 tablets (80 mg total) by mouth every other day. TAKE 80 MG ALTERNATING 40 MG EVERY OTHER DAY Patient taking differently: Take 40-80 mg by mouth every other day. TAKE 80 MG ALTERNATING 40 MG EVERY OTHER DAY 06/26/23   Ursuy, Renee Lynn, PA-C  HYDROcodone -acetaminophen  (NORCO/VICODIN) 5-325 MG tablet Take 1 tablet by mouth every 6 (six) hours as needed for moderate pain (pain score 4-6) or severe pain (pain score 7-10) (post op pain). 11/11/23 11/10/24  Nida, Andrew, PA-C  levothyroxine  (SYNTHROID ) 88 MCG tablet Take 88 mcg by mouth daily before breakfast.    [provider]  Lutein  20 MG TABS Take 20 mg by mouth daily at 12 noon.    [provider]  magnesium  oxide (MAG-OX) 400 MG tablet Take 1 tablet (400 mg total) by mouth daily. 03/21/14   Verona Goodwill, MD  Melatonin 10 MG TABS Take 10-40 mg by mouth at bedtime as needed (sleep).    [provider]  metoprolol  succinate (TOPROL -XL) 100 MG 24 hr tablet Take 1 tablet (100 mg total) by mouth in the morning and at bedtime. Take with or immediately following a meal. 11/17/23  Lauralee Poll, MD  Multiple Vitamin (MULTIVITAMIN WITH MINERALS) TABS tablet Take 1 tablet by mouth daily at 12 noon.    [provider]  Multiple Vitamins-Minerals (PRESERVISION AREDS 2 PO) Take 1 capsule by mouth 2 (two) times daily with a meal.     [provider]  sertraline  (ZOLOFT ) 50 MG tablet Take 1 tablet (50 mg total) by mouth daily. 03/22/19 12/01/23  Asa Lauth, NP  spironolactone  (ALDACTONE ) 25 MG tablet Take 1 tablet (25 mg total) by mouth daily. Patient taking differently: Take 25 mg by mouth daily at 12 noon. 10/14/23   Lauralee Poll, MD  tiZANidine  (ZANAFLEX ) 2 MG tablet Take 1 tablet (2 mg total) by mouth every 6 (six) hours as needed for muscle  spasms. 11/11/23 11/10/24  Lucrezia Sachs, PA-C      Allergies    Citalopram  hydrobromide, Adhesive [tape], Bacitracin-polymyxin b, Benzalkonium chloride, and Neosporin [neomycin-polymyxin-gramicidin]    Review of Systems   Review of Systems  Musculoskeletal:        Right knee pain  All other systems reviewed and are negative.   Physical Exam Updated Vital Signs BP (!) 116/47   Pulse 62   Temp 98.1 F (36.7 C) (Oral)   Resp 18   LMP 07/22/1968   SpO2 96%  Physical Exam Vitals and nursing note reviewed.  Constitutional:      Appearance: Normal appearance.  HENT:     Head: Normocephalic and atraumatic.     Right Ear: External ear normal.     Left Ear: External ear normal.     Nose: Nose normal.     Mouth/Throat:     Mouth: Mucous membranes are moist.     Pharynx: Oropharynx is clear.  Eyes:     Extraocular Movements: Extraocular movements intact.     Conjunctiva/sclera: Conjunctivae normal.     Pupils: Pupils are equal, round, and reactive to light.  Cardiovascular:     Rate and Rhythm: Normal rate and regular rhythm.     Pulses: Normal pulses.     Heart sounds: Normal heart sounds.  Pulmonary:     Effort: Pulmonary effort is normal.     Breath sounds: Normal breath sounds.  Abdominal:     General: Abdomen is flat. Bowel sounds are normal.     Palpations: Abdomen is soft.  Musculoskeletal:     Cervical back: Normal range of motion and neck supple.     Right knee: Swelling present. Decreased range of motion.     Comments: See picture  Skin:    General: Skin is warm.     Capillary Refill: Capillary refill takes less than 2 seconds.  Neurological:     General: No focal deficit present.     Mental Status: She is alert and oriented to person, place, and time.  Psychiatric:        Mood and Affect: Mood normal.        Behavior: Behavior normal.        ED Results / Procedures / Treatments   Labs (all labs ordered are listed, but only abnormal results are  displayed) Labs Reviewed  CBC WITH DIFFERENTIAL/PLATELET - Abnormal; Notable for the following components:      Result Value   WBC 12.3 (*)    RBC 2.88 (*)    Hemoglobin 9.1 (*)    HCT 29.3 (*)    MCV 101.7 (*)    Platelets 454 (*)    Neutro Abs 9.6 (*)  Monocytes Absolute 1.3 (*)    Abs Immature Granulocytes 0.20 (*)    All other components within normal limits  COMPREHENSIVE METABOLIC PANEL WITH GFR - Abnormal; Notable for the following components:   Glucose, Bld 115 (*)    Total Protein 6.4 (*)    Albumin 2.9 (*)    All other components within normal limits  URINALYSIS, ROUTINE W REFLEX MICROSCOPIC - Abnormal; Notable for the following components:   APPearance CLOUDY (*)    Glucose, UA >=500 (*)    Leukocytes,Ua TRACE (*)    Bacteria, UA MANY (*)    All other components within normal limits  CBG MONITORING, ED - Abnormal; Notable for the following components:   Glucose-Capillary 106 (*)    All other components within normal limits  CULTURE, BLOOD (ROUTINE X 2)  CULTURE, BLOOD (ROUTINE X 2)  DIGOXIN  LEVEL  I-STAT CG4 LACTIC ACID, ED  I-STAT CG4 LACTIC ACID, ED    EKG EKG Interpretation Date/Time:  Thursday Nov 20 2023 12:06:50 EDT Ventricular Rate:  60 PR Interval:    QRS Duration:  174 QT Interval:  471 QTC Calculation: 471 R Axis:   -70  Text Interpretation: VENTRICULAR PACED RHYTHM No significant change since last tracing Confirmed by Sueellen Emery [phone]) on 11/20/2023 12:35:46 PM  Radiology DG Chest Portable 1 View Result Date: 11/20/2023 CLINICAL DATA:  AMS EXAM: PORTABLE CHEST - 1 VIEW COMPARISON:  February 04, 2023, September 12, 2023 FINDINGS: No focal airspace consolidation, pleural effusion, or pneumothorax. Punctate calcified granulomas in both lung bases. Mild cardiomegaly. Left chest pacemaker with leads terminating in the right atrium and right ventricle. Tortuous aorta with aortic atherosclerosis. No acute fracture or destructive lesions. Multilevel  thoracic osteophytosis. Osteoarthritis of both glenohumeral joints. IMPRESSION: Mild cardiomegaly.  No acute cardiopulmonary abnormality. Electronically Signed   By: Rance Burrows M.D.   On: 11/20/2023 14:44    Procedures Procedures    Medications Ordered in ED Medications  cefTRIAXone  (ROCEPHIN ) 1 g in sodium chloride  0.9 % 100 mL IVPB (has no administration in time range)  sodium chloride  0.9 % bolus 500 mL (0 mLs Intravenous Stopped 11/20/23 1448)    ED Course/ Medical Decision Making/ A&P                                 Medical Decision Making Amount and/or Complexity of Data Reviewed Labs: ordered. Radiology: ordered.   This patient presents to the ED for concern of right knee pain, this involves an extensive number of treatment options, and is a complaint that carries with it a high risk of complications and morbidity.  The differential diagnosis includes cellulitis, septic joint, electrolyte abn   Co morbidities that complicate the patient evaluation  afib (on Eliquis ), htn, depression, gerd, hypothyroidism, and recent right knee replacement by Dr. Ana Balling on 4/22   Additional history obtained:  Additional history obtained from epic chart review External records from outside source obtained and reviewed including EMS report   Lab Tests:  I Ordered, and personally interpreted labs.  The pertinent results include:  ua + bacteria, cmp nl, lactic nl, cbc nl other than wbc sl elevated at 12.3, hgb 9.1 (hgb 8.9 on 4/29 and 14.2 prior to surgery)   Imaging Studies ordered:  I ordered imaging studies including cxr  I independently visualized and interpreted imaging which showed Mild cardiomegaly.  No acute cardiopulmonary abnormality.  I agree with the radiologist interpretation  Cardiac Monitoring:  The patient was maintained on a cardiac monitor.  I personally viewed and interpreted the cardiac monitored which showed an underlying rhythm of: paced  rhythm   Medicines ordered and prescription drug management:  I ordered medication including rocephin   for uti  Reevaluation of the patient after these medicines showed that the patient improved I have reviewed the patients home medicines and have made adjustments as needed   Critical Interventions:  abx   Consultations Obtained:  I requested consultation with the orthopedist Steffanie Edouard),  and discussed lab and imaging findings as well as pertinent plan - he spoke with Dr. Dalldorf who said it was ok for d/c.     Problem List / ED Course:  Right knee pain:  likely hematoma as hgb has dropped from 14 to 9.   Confusion:  pt's husband said she's back to normal.  She is alert here.  She does have a uti.  She is given rocephin  here and is d/c with keflex .   Reevaluation:  After the interventions noted above, I reevaluated the patient and found that they have :improved   Social Determinants of Health:  Lives at [facility]   Dispostion:  After consideration of the diagnostic results and the patients response to treatment, I feel that the patent would benefit from discharge with outpatient f/u.          Final Clinical Impression(s) / ED Diagnoses Final diagnoses:  Acute cystitis without hematuria  Anemia, unspecified type  Hematoma of right knee region    Rx / DC Orders ED Discharge Orders          Ordered    cephALEXin  (KEFLEX ) 500 MG capsule  3 times daily,   Status:  Discontinued        11/20/23 1513    cephALEXin  (KEFLEX ) 500 MG capsule  3 times daily        11/20/23 1513              Sueellen Emery, MD 11/20/23 1515

## 2023-11-20 NOTE — ED Triage Notes (Signed)
 Pt presents to ED via EMS from [facility] facility for new onset of confusion reported by staff and husband. Patient had a knee replacement recently at [facility] and facility and husband concerned due to patients confusion. Patient complains of Right leg pain 0/10. Denies N/V/D.

## 2023-11-21 ENCOUNTER — Telehealth: Payer: Self-pay

## 2023-11-21 DIAGNOSIS — R262 Difficulty in walking, not elsewhere classified: Secondary | ICD-10-CM | POA: Diagnosis not present

## 2023-11-21 DIAGNOSIS — M1711 Unilateral primary osteoarthritis, right knee: Secondary | ICD-10-CM | POA: Diagnosis not present

## 2023-11-21 DIAGNOSIS — Z471 Aftercare following joint replacement surgery: Secondary | ICD-10-CM | POA: Diagnosis not present

## 2023-11-21 NOTE — Telephone Encounter (Signed)
 Called Lorraine Callicutt, RN at [facility], where pt is for short term rehab for medication clarification. I left message that pt takes Metoprolol  Succinate ER 24hr 100 mg tablets pt takes 1 tablet by mouth twice daily and advise for her to call back if she needs anymore information.

## 2023-11-22 DIAGNOSIS — Z471 Aftercare following joint replacement surgery: Secondary | ICD-10-CM | POA: Diagnosis not present

## 2023-11-22 DIAGNOSIS — R262 Difficulty in walking, not elsewhere classified: Secondary | ICD-10-CM | POA: Diagnosis not present

## 2023-11-24 DIAGNOSIS — F3341 Major depressive disorder, recurrent, in partial remission: Secondary | ICD-10-CM | POA: Diagnosis not present

## 2023-11-24 DIAGNOSIS — R262 Difficulty in walking, not elsewhere classified: Secondary | ICD-10-CM | POA: Diagnosis not present

## 2023-11-24 DIAGNOSIS — Z471 Aftercare following joint replacement surgery: Secondary | ICD-10-CM | POA: Diagnosis not present

## 2023-11-25 ENCOUNTER — Ambulatory Visit
Admission: RE | Admit: 2023-11-25 | Discharge: 2023-11-25 | Disposition: A | Discharge status: Home / Self Care | Source: Ambulatory Visit | Attending: Family Medicine | Admitting: Family Medicine

## 2023-11-25 DIAGNOSIS — R262 Difficulty in walking, not elsewhere classified: Secondary | ICD-10-CM | POA: Diagnosis not present

## 2023-11-25 DIAGNOSIS — R9389 Abnormal findings on diagnostic imaging of other specified body structures: Secondary | ICD-10-CM

## 2023-11-25 DIAGNOSIS — Z471 Aftercare following joint replacement surgery: Secondary | ICD-10-CM | POA: Diagnosis not present

## 2023-11-25 DIAGNOSIS — Z96651 Presence of right artificial knee joint: Secondary | ICD-10-CM | POA: Diagnosis not present

## 2023-11-25 DIAGNOSIS — K862 Cyst of pancreas: Secondary | ICD-10-CM | POA: Diagnosis not present

## 2023-11-25 LAB — CULTURE, BLOOD (ROUTINE X 2)
Culture: NO GROWTH
Culture: NO GROWTH
Special Requests: ADEQUATE

## 2023-11-25 MED ORDER — IOPAMIDOL (ISOVUE-300) INJECTION 61%
100.0000 mL | Freq: Once | INTRAVENOUS | Status: AC | PRN
  Administered 2023-11-25: 100 mL via INTRAVENOUS

## 2023-11-26 DIAGNOSIS — R262 Difficulty in walking, not elsewhere classified: Secondary | ICD-10-CM | POA: Diagnosis not present

## 2023-11-26 DIAGNOSIS — D649 Anemia, unspecified: Secondary | ICD-10-CM | POA: Diagnosis not present

## 2023-11-26 DIAGNOSIS — Z7901 Long term (current) use of anticoagulants: Secondary | ICD-10-CM | POA: Diagnosis not present

## 2023-11-26 DIAGNOSIS — Z471 Aftercare following joint replacement surgery: Secondary | ICD-10-CM | POA: Diagnosis not present

## 2023-11-27 DIAGNOSIS — Z471 Aftercare following joint replacement surgery: Secondary | ICD-10-CM | POA: Diagnosis not present

## 2023-11-27 DIAGNOSIS — R262 Difficulty in walking, not elsewhere classified: Secondary | ICD-10-CM | POA: Diagnosis not present

## 2023-11-28 DIAGNOSIS — R262 Difficulty in walking, not elsewhere classified: Secondary | ICD-10-CM | POA: Diagnosis not present

## 2023-11-28 DIAGNOSIS — I10 Essential (primary) hypertension: Secondary | ICD-10-CM | POA: Diagnosis not present

## 2023-11-28 DIAGNOSIS — D649 Anemia, unspecified: Secondary | ICD-10-CM | POA: Diagnosis not present

## 2023-11-28 DIAGNOSIS — F3341 Major depressive disorder, recurrent, in partial remission: Secondary | ICD-10-CM | POA: Diagnosis not present

## 2023-11-28 DIAGNOSIS — Z471 Aftercare following joint replacement surgery: Secondary | ICD-10-CM | POA: Diagnosis not present

## 2023-11-28 DIAGNOSIS — Z96659 Presence of unspecified artificial knee joint: Secondary | ICD-10-CM | POA: Diagnosis not present

## 2023-11-29 DIAGNOSIS — E785 Hyperlipidemia, unspecified: Secondary | ICD-10-CM | POA: Diagnosis not present

## 2023-11-29 DIAGNOSIS — R262 Difficulty in walking, not elsewhere classified: Secondary | ICD-10-CM | POA: Diagnosis not present

## 2023-11-29 DIAGNOSIS — E559 Vitamin D deficiency, unspecified: Secondary | ICD-10-CM | POA: Diagnosis not present

## 2023-11-29 DIAGNOSIS — Z471 Aftercare following joint replacement surgery: Secondary | ICD-10-CM | POA: Diagnosis not present

## 2023-11-29 DIAGNOSIS — I4821 Permanent atrial fibrillation: Secondary | ICD-10-CM | POA: Diagnosis not present

## 2023-12-01 DIAGNOSIS — R262 Difficulty in walking, not elsewhere classified: Secondary | ICD-10-CM | POA: Diagnosis not present

## 2023-12-01 DIAGNOSIS — Z471 Aftercare following joint replacement surgery: Secondary | ICD-10-CM | POA: Diagnosis not present

## 2023-12-02 ENCOUNTER — Other Ambulatory Visit

## 2023-12-03 DIAGNOSIS — Z471 Aftercare following joint replacement surgery: Secondary | ICD-10-CM | POA: Diagnosis not present

## 2023-12-03 DIAGNOSIS — R2689 Other abnormalities of gait and mobility: Secondary | ICD-10-CM | POA: Diagnosis not present

## 2023-12-03 DIAGNOSIS — M6281 Muscle weakness (generalized): Secondary | ICD-10-CM | POA: Diagnosis not present

## 2023-12-03 DIAGNOSIS — M25561 Pain in right knee: Secondary | ICD-10-CM | POA: Diagnosis not present

## 2023-12-04 DIAGNOSIS — R2689 Other abnormalities of gait and mobility: Secondary | ICD-10-CM | POA: Diagnosis not present

## 2023-12-04 DIAGNOSIS — M25561 Pain in right knee: Secondary | ICD-10-CM | POA: Diagnosis not present

## 2023-12-04 DIAGNOSIS — M6281 Muscle weakness (generalized): Secondary | ICD-10-CM | POA: Diagnosis not present

## 2023-12-04 DIAGNOSIS — Z471 Aftercare following joint replacement surgery: Secondary | ICD-10-CM | POA: Diagnosis not present

## 2023-12-05 DIAGNOSIS — R2689 Other abnormalities of gait and mobility: Secondary | ICD-10-CM | POA: Diagnosis not present

## 2023-12-05 DIAGNOSIS — Z471 Aftercare following joint replacement surgery: Secondary | ICD-10-CM | POA: Diagnosis not present

## 2023-12-05 DIAGNOSIS — M25561 Pain in right knee: Secondary | ICD-10-CM | POA: Diagnosis not present

## 2023-12-05 DIAGNOSIS — M6281 Muscle weakness (generalized): Secondary | ICD-10-CM | POA: Diagnosis not present

## 2023-12-08 DIAGNOSIS — Z471 Aftercare following joint replacement surgery: Secondary | ICD-10-CM | POA: Diagnosis not present

## 2023-12-08 DIAGNOSIS — M6281 Muscle weakness (generalized): Secondary | ICD-10-CM | POA: Diagnosis not present

## 2023-12-08 DIAGNOSIS — R2689 Other abnormalities of gait and mobility: Secondary | ICD-10-CM | POA: Diagnosis not present

## 2023-12-08 DIAGNOSIS — M25561 Pain in right knee: Secondary | ICD-10-CM | POA: Diagnosis not present

## 2023-12-09 DIAGNOSIS — M25561 Pain in right knee: Secondary | ICD-10-CM | POA: Diagnosis not present

## 2023-12-09 DIAGNOSIS — R2689 Other abnormalities of gait and mobility: Secondary | ICD-10-CM | POA: Diagnosis not present

## 2023-12-09 DIAGNOSIS — Z471 Aftercare following joint replacement surgery: Secondary | ICD-10-CM | POA: Diagnosis not present

## 2023-12-09 DIAGNOSIS — M6281 Muscle weakness (generalized): Secondary | ICD-10-CM | POA: Diagnosis not present

## 2023-12-10 DIAGNOSIS — D649 Anemia, unspecified: Secondary | ICD-10-CM | POA: Diagnosis not present

## 2023-12-10 DIAGNOSIS — I10 Essential (primary) hypertension: Secondary | ICD-10-CM | POA: Diagnosis not present

## 2023-12-10 DIAGNOSIS — Z96651 Presence of right artificial knee joint: Secondary | ICD-10-CM | POA: Diagnosis not present

## 2023-12-10 DIAGNOSIS — R7303 Prediabetes: Secondary | ICD-10-CM | POA: Diagnosis not present

## 2023-12-10 DIAGNOSIS — I4891 Unspecified atrial fibrillation: Secondary | ICD-10-CM | POA: Diagnosis not present

## 2023-12-11 ENCOUNTER — Ambulatory Visit (INDEPENDENT_AMBULATORY_CARE_PROVIDER_SITE_OTHER): Payer: Medicare Other

## 2023-12-11 DIAGNOSIS — M25561 Pain in right knee: Secondary | ICD-10-CM | POA: Diagnosis not present

## 2023-12-11 DIAGNOSIS — I495 Sick sinus syndrome: Secondary | ICD-10-CM | POA: Diagnosis not present

## 2023-12-11 DIAGNOSIS — Z471 Aftercare following joint replacement surgery: Secondary | ICD-10-CM | POA: Diagnosis not present

## 2023-12-11 DIAGNOSIS — R2689 Other abnormalities of gait and mobility: Secondary | ICD-10-CM | POA: Diagnosis not present

## 2023-12-11 DIAGNOSIS — M6281 Muscle weakness (generalized): Secondary | ICD-10-CM | POA: Diagnosis not present

## 2023-12-11 LAB — CUP PACEART REMOTE DEVICE CHECK
Battery Remaining Longevity: 147 mo
Battery Voltage: 3.04 V
Brady Statistic RA Percent Paced: 0 %
Brady Statistic RV Percent Paced: 54.47 %
Date Time Interrogation Session: 20250522011848
Implantable Lead Connection Status: 753985
Implantable Lead Connection Status: 753985
Implantable Lead Implant Date: 20121213
Implantable Lead Implant Date: 20121213
Implantable Lead Location: 753859
Implantable Lead Location: 753860
Implantable Lead Model: 4076
Implantable Lead Model: 4076
Implantable Lead Serial Number: 835025
Implantable Lead Serial Number: 869269
Implantable Pulse Generator Implant Date: 20240201
Lead Channel Impedance Value: 304 Ohm
Lead Channel Impedance Value: 380 Ohm
Lead Channel Impedance Value: 380 Ohm
Lead Channel Impedance Value: 494 Ohm
Lead Channel Pacing Threshold Amplitude: 0.875 V
Lead Channel Pacing Threshold Pulse Width: 0.4 ms
Lead Channel Sensing Intrinsic Amplitude: 0.375 mV
Lead Channel Sensing Intrinsic Amplitude: 0.375 mV
Lead Channel Sensing Intrinsic Amplitude: 7.375 mV
Lead Channel Sensing Intrinsic Amplitude: 7.375 mV
Lead Channel Setting Pacing Amplitude: 2 V
Lead Channel Setting Pacing Pulse Width: 0.4 ms
Lead Channel Setting Sensing Sensitivity: 0.9 mV
Zone Setting Status: 755011
Zone Setting Status: 755011

## 2023-12-12 DIAGNOSIS — M25561 Pain in right knee: Secondary | ICD-10-CM | POA: Diagnosis not present

## 2023-12-12 DIAGNOSIS — Z471 Aftercare following joint replacement surgery: Secondary | ICD-10-CM | POA: Diagnosis not present

## 2023-12-12 DIAGNOSIS — M6281 Muscle weakness (generalized): Secondary | ICD-10-CM | POA: Diagnosis not present

## 2023-12-12 DIAGNOSIS — R2689 Other abnormalities of gait and mobility: Secondary | ICD-10-CM | POA: Diagnosis not present

## 2023-12-15 DIAGNOSIS — Z471 Aftercare following joint replacement surgery: Secondary | ICD-10-CM | POA: Diagnosis not present

## 2023-12-15 DIAGNOSIS — M6281 Muscle weakness (generalized): Secondary | ICD-10-CM | POA: Diagnosis not present

## 2023-12-15 DIAGNOSIS — R2689 Other abnormalities of gait and mobility: Secondary | ICD-10-CM | POA: Diagnosis not present

## 2023-12-15 DIAGNOSIS — M25561 Pain in right knee: Secondary | ICD-10-CM | POA: Diagnosis not present

## 2023-12-16 DIAGNOSIS — R2689 Other abnormalities of gait and mobility: Secondary | ICD-10-CM | POA: Diagnosis not present

## 2023-12-16 DIAGNOSIS — M6281 Muscle weakness (generalized): Secondary | ICD-10-CM | POA: Diagnosis not present

## 2023-12-16 DIAGNOSIS — Z471 Aftercare following joint replacement surgery: Secondary | ICD-10-CM | POA: Diagnosis not present

## 2023-12-16 DIAGNOSIS — M25561 Pain in right knee: Secondary | ICD-10-CM | POA: Diagnosis not present

## 2023-12-18 DIAGNOSIS — M6281 Muscle weakness (generalized): Secondary | ICD-10-CM | POA: Diagnosis not present

## 2023-12-18 DIAGNOSIS — M25561 Pain in right knee: Secondary | ICD-10-CM | POA: Diagnosis not present

## 2023-12-18 DIAGNOSIS — R2689 Other abnormalities of gait and mobility: Secondary | ICD-10-CM | POA: Diagnosis not present

## 2023-12-18 DIAGNOSIS — Z471 Aftercare following joint replacement surgery: Secondary | ICD-10-CM | POA: Diagnosis not present

## 2023-12-19 DIAGNOSIS — R2689 Other abnormalities of gait and mobility: Secondary | ICD-10-CM | POA: Diagnosis not present

## 2023-12-19 DIAGNOSIS — M6281 Muscle weakness (generalized): Secondary | ICD-10-CM | POA: Diagnosis not present

## 2023-12-19 DIAGNOSIS — Z471 Aftercare following joint replacement surgery: Secondary | ICD-10-CM | POA: Diagnosis not present

## 2023-12-19 DIAGNOSIS — M25561 Pain in right knee: Secondary | ICD-10-CM | POA: Diagnosis not present

## 2023-12-22 ENCOUNTER — Encounter (HOSPITAL_BASED_OUTPATIENT_CLINIC_OR_DEPARTMENT_OTHER): Payer: Self-pay

## 2023-12-22 DIAGNOSIS — R2689 Other abnormalities of gait and mobility: Secondary | ICD-10-CM | POA: Diagnosis not present

## 2023-12-22 DIAGNOSIS — Z471 Aftercare following joint replacement surgery: Secondary | ICD-10-CM | POA: Diagnosis not present

## 2023-12-22 DIAGNOSIS — M25561 Pain in right knee: Secondary | ICD-10-CM | POA: Diagnosis not present

## 2023-12-22 DIAGNOSIS — M6281 Muscle weakness (generalized): Secondary | ICD-10-CM | POA: Diagnosis not present

## 2023-12-23 ENCOUNTER — Ambulatory Visit: Payer: Self-pay | Admitting: Cardiovascular Disease

## 2023-12-23 DIAGNOSIS — R2689 Other abnormalities of gait and mobility: Secondary | ICD-10-CM | POA: Diagnosis not present

## 2023-12-23 DIAGNOSIS — M6281 Muscle weakness (generalized): Secondary | ICD-10-CM | POA: Diagnosis not present

## 2023-12-23 DIAGNOSIS — M25561 Pain in right knee: Secondary | ICD-10-CM | POA: Diagnosis not present

## 2023-12-23 DIAGNOSIS — Z471 Aftercare following joint replacement surgery: Secondary | ICD-10-CM | POA: Diagnosis not present

## 2023-12-24 ENCOUNTER — Encounter: Payer: Self-pay | Admitting: Student

## 2023-12-24 ENCOUNTER — Ambulatory Visit: Attending: Student | Admitting: Student

## 2023-12-24 VITALS — BP 134/66 | HR 70 | Ht 68.0 in | Wt 200.0 lb

## 2023-12-24 DIAGNOSIS — I495 Sick sinus syndrome: Secondary | ICD-10-CM

## 2023-12-24 DIAGNOSIS — I5032 Chronic diastolic (congestive) heart failure: Secondary | ICD-10-CM

## 2023-12-24 DIAGNOSIS — I4821 Permanent atrial fibrillation: Secondary | ICD-10-CM

## 2023-12-24 DIAGNOSIS — I10 Essential (primary) hypertension: Secondary | ICD-10-CM | POA: Diagnosis not present

## 2023-12-24 DIAGNOSIS — D6869 Other thrombophilia: Secondary | ICD-10-CM | POA: Diagnosis not present

## 2023-12-24 LAB — CUP PACEART INCLINIC DEVICE CHECK
Battery Remaining Longevity: 148 mo
Battery Voltage: 3.04 V
Brady Statistic RA Percent Paced: 0 %
Brady Statistic RV Percent Paced: 52.54 %
Date Time Interrogation Session: 20250604134018
Implantable Lead Connection Status: 753985
Implantable Lead Connection Status: 753985
Implantable Lead Implant Date: 20121213
Implantable Lead Implant Date: 20121213
Implantable Lead Location: 753859
Implantable Lead Location: 753860
Implantable Lead Model: 4076
Implantable Lead Model: 4076
Implantable Lead Serial Number: 835025
Implantable Lead Serial Number: 869269
Implantable Pulse Generator Implant Date: 20240201
Lead Channel Impedance Value: 304 Ohm
Lead Channel Impedance Value: 399 Ohm
Lead Channel Impedance Value: 399 Ohm
Lead Channel Impedance Value: 532 Ohm
Lead Channel Pacing Threshold Amplitude: 0.875 V
Lead Channel Pacing Threshold Pulse Width: 0.4 ms
Lead Channel Sensing Intrinsic Amplitude: 0.375 mV
Lead Channel Sensing Intrinsic Amplitude: 0.5 mV
Lead Channel Sensing Intrinsic Amplitude: 11.75 mV
Lead Channel Sensing Intrinsic Amplitude: 9.5 mV
Lead Channel Setting Pacing Amplitude: 2 V
Lead Channel Setting Pacing Pulse Width: 0.4 ms
Lead Channel Setting Sensing Sensitivity: 0.9 mV
Zone Setting Status: 755011
Zone Setting Status: 755011

## 2023-12-24 NOTE — Progress Notes (Signed)
  Electrophysiology Office Note:   ID:  Brittany [name], DOB [birth year], MRN [id number]  Primary Cardiologist: Richardo Chandler, MD Electrophysiologist: Richardo Chandler, MD      History of Present Illness:   Brittany [name] is a 89 y.o. female with h/o stroke, SSSx w/PPM, Afib, ATach, HTN, HLD, hypothyroidism, OSA w/CPAP  seen today for routine electrophysiology followup.   Since last being seen in our clinic the patient reports doing well. Underwent knee surgery, and since working with PT her dyspnea has improved. Getting around [facility]. Denies chest pain, syncope, or palpitations.   Review of systems complete and found to be negative unless listed in HPI.   EP Information / Studies Reviewed:    EKG is not ordered today. EKG from 11/20/2023 reviewed which showed AF at 60 bpm       PPM Interrogation-  reviewed in detail today,  See PACEART report.  Arrhythmia/Device History MDT dual chamber PPM implanted 07/04/11, gen change 08/22/22   Physical Exam:   VS:  BP 134/66 (BP Location: Right Arm, Patient Position: Sitting, Cuff Size: Normal)   Pulse 70   Ht 5\' 8"  (1.727 m)   Wt 200 lb (90.7 kg)   LMP 07/22/1968   SpO2 98%   BMI 30.41 kg/m    Wt Readings from Last 3 Encounters:  12/24/23 200 lb (90.7 kg)  11/11/23 210 lb 3.3 oz (95.4 kg)  11/03/23 210 lb 3.3 oz (95.4 kg)     GEN: No acute distress  NECK: No JVD; No carotid bruits CARDIAC: Regular rate and rhythm, no murmurs, rubs, gallops RESPIRATORY:  Clear to auscultation without rales, wheezing or rhonchi  ABDOMEN: Soft, non-tender, non-distended EXTREMITIES:  No edema; No deformity   ASSESSMENT AND PLAN:    Sick sinus syndrome s/p [employer] PPM  Normal PPM function See Pace Art report No changes today  Permanent AF Has failed multiple AADs including amiodarone  Rates controlled Continue Eliqius 5 mg BID for CHA2DS2/VASc of at least 8  Secondary hypercoagulable state Pt on Eliquis  as above     Chronic diastolic CHF Follows  with Dr. Alease Amend Volume status stable today.     Disposition:   Follow up with EP Team in 12 months  Signed, Tylene Galla, PA-C

## 2023-12-24 NOTE — Patient Instructions (Signed)
 Medication Instructions:  No medication changes today. *If you need a refill on your cardiac medications before your next appointment, please call your pharmacy*  Lab Work: No labwork ordered today. If you have labs (blood work) drawn today and your tests are completely normal, you will receive your results only by: MyChart Message (if you have MyChart) OR A paper copy in the mail If you have any lab test that is abnormal or we need to change your treatment, we will call you to review the results.  Testing/Procedures: No testing ordered today  Follow-Up: At [facility], you and your health needs are our priority.  As part of our continuing mission to provide you with exceptional heart care, our providers are all part of one team.  This team includes your primary Cardiologist (physician) and Advanced Practice Providers or APPs (Physician Assistants and Nurse Practitioners) who all work together to provide you with the care you need, when you need it.  Your next appointment:   12 month(s)  Provider:   You may see Richardo Chandler, MD or one of the following Advanced Practice Providers on your designated Care Team:   Mertha Abrahams, Kennard Pea "Jonelle Neri" [place], PA-C Suzann Riddle, NP Creighton Doffing, NP    We recommend signing up for the patient portal called "MyChart".  Sign up information is provided on this After Visit Summary.  MyChart is used to connect with patients for Virtual Visits (Telemedicine).  Patients are able to view lab/test results, encounter notes, upcoming appointments, etc.  Non-urgent messages can be sent to your provider as well.   To learn more about what you can do with MyChart, go to [email or web address].

## 2023-12-25 ENCOUNTER — Ambulatory Visit: Payer: Self-pay | Admitting: Cardiovascular Disease

## 2023-12-25 DIAGNOSIS — R2689 Other abnormalities of gait and mobility: Secondary | ICD-10-CM | POA: Diagnosis not present

## 2023-12-25 DIAGNOSIS — M6281 Muscle weakness (generalized): Secondary | ICD-10-CM | POA: Diagnosis not present

## 2023-12-25 DIAGNOSIS — M25561 Pain in right knee: Secondary | ICD-10-CM | POA: Diagnosis not present

## 2023-12-25 DIAGNOSIS — Z471 Aftercare following joint replacement surgery: Secondary | ICD-10-CM | POA: Diagnosis not present

## 2023-12-26 DIAGNOSIS — Z471 Aftercare following joint replacement surgery: Secondary | ICD-10-CM | POA: Diagnosis not present

## 2023-12-26 DIAGNOSIS — M6281 Muscle weakness (generalized): Secondary | ICD-10-CM | POA: Diagnosis not present

## 2023-12-26 DIAGNOSIS — R2689 Other abnormalities of gait and mobility: Secondary | ICD-10-CM | POA: Diagnosis not present

## 2023-12-26 DIAGNOSIS — M25561 Pain in right knee: Secondary | ICD-10-CM | POA: Diagnosis not present

## 2023-12-29 DIAGNOSIS — Z471 Aftercare following joint replacement surgery: Secondary | ICD-10-CM | POA: Diagnosis not present

## 2023-12-29 DIAGNOSIS — R2689 Other abnormalities of gait and mobility: Secondary | ICD-10-CM | POA: Diagnosis not present

## 2023-12-29 DIAGNOSIS — M6281 Muscle weakness (generalized): Secondary | ICD-10-CM | POA: Diagnosis not present

## 2023-12-29 DIAGNOSIS — M25561 Pain in right knee: Secondary | ICD-10-CM | POA: Diagnosis not present

## 2023-12-30 DIAGNOSIS — M25561 Pain in right knee: Secondary | ICD-10-CM | POA: Diagnosis not present

## 2023-12-30 DIAGNOSIS — M6281 Muscle weakness (generalized): Secondary | ICD-10-CM | POA: Diagnosis not present

## 2023-12-30 DIAGNOSIS — R2689 Other abnormalities of gait and mobility: Secondary | ICD-10-CM | POA: Diagnosis not present

## 2023-12-30 DIAGNOSIS — Z471 Aftercare following joint replacement surgery: Secondary | ICD-10-CM | POA: Diagnosis not present

## 2023-12-31 DIAGNOSIS — Z1283 Encounter for screening for malignant neoplasm of skin: Secondary | ICD-10-CM | POA: Diagnosis not present

## 2023-12-31 DIAGNOSIS — D225 Melanocytic nevi of trunk: Secondary | ICD-10-CM | POA: Diagnosis not present

## 2023-12-31 DIAGNOSIS — M25561 Pain in right knee: Secondary | ICD-10-CM | POA: Diagnosis not present

## 2023-12-31 DIAGNOSIS — Z85820 Personal history of malignant melanoma of skin: Secondary | ICD-10-CM | POA: Diagnosis not present

## 2023-12-31 DIAGNOSIS — M6281 Muscle weakness (generalized): Secondary | ICD-10-CM | POA: Diagnosis not present

## 2023-12-31 DIAGNOSIS — R2689 Other abnormalities of gait and mobility: Secondary | ICD-10-CM | POA: Diagnosis not present

## 2023-12-31 DIAGNOSIS — Z08 Encounter for follow-up examination after completed treatment for malignant neoplasm: Secondary | ICD-10-CM | POA: Diagnosis not present

## 2023-12-31 DIAGNOSIS — Z471 Aftercare following joint replacement surgery: Secondary | ICD-10-CM | POA: Diagnosis not present

## 2024-01-02 DIAGNOSIS — M25561 Pain in right knee: Secondary | ICD-10-CM | POA: Diagnosis not present

## 2024-01-02 DIAGNOSIS — Z471 Aftercare following joint replacement surgery: Secondary | ICD-10-CM | POA: Diagnosis not present

## 2024-01-02 DIAGNOSIS — R2689 Other abnormalities of gait and mobility: Secondary | ICD-10-CM | POA: Diagnosis not present

## 2024-01-02 DIAGNOSIS — M6281 Muscle weakness (generalized): Secondary | ICD-10-CM | POA: Diagnosis not present

## 2024-01-05 DIAGNOSIS — R2689 Other abnormalities of gait and mobility: Secondary | ICD-10-CM | POA: Diagnosis not present

## 2024-01-05 DIAGNOSIS — M6281 Muscle weakness (generalized): Secondary | ICD-10-CM | POA: Diagnosis not present

## 2024-01-05 DIAGNOSIS — Z471 Aftercare following joint replacement surgery: Secondary | ICD-10-CM | POA: Diagnosis not present

## 2024-01-05 DIAGNOSIS — M25561 Pain in right knee: Secondary | ICD-10-CM | POA: Diagnosis not present

## 2024-01-07 DIAGNOSIS — M25561 Pain in right knee: Secondary | ICD-10-CM | POA: Diagnosis not present

## 2024-01-07 DIAGNOSIS — M6281 Muscle weakness (generalized): Secondary | ICD-10-CM | POA: Diagnosis not present

## 2024-01-07 DIAGNOSIS — R2689 Other abnormalities of gait and mobility: Secondary | ICD-10-CM | POA: Diagnosis not present

## 2024-01-07 DIAGNOSIS — Z471 Aftercare following joint replacement surgery: Secondary | ICD-10-CM | POA: Diagnosis not present

## 2024-01-08 DIAGNOSIS — M6281 Muscle weakness (generalized): Secondary | ICD-10-CM | POA: Diagnosis not present

## 2024-01-08 DIAGNOSIS — R2689 Other abnormalities of gait and mobility: Secondary | ICD-10-CM | POA: Diagnosis not present

## 2024-01-08 DIAGNOSIS — M25561 Pain in right knee: Secondary | ICD-10-CM | POA: Diagnosis not present

## 2024-01-08 DIAGNOSIS — Z471 Aftercare following joint replacement surgery: Secondary | ICD-10-CM | POA: Diagnosis not present

## 2024-01-09 DIAGNOSIS — R2689 Other abnormalities of gait and mobility: Secondary | ICD-10-CM | POA: Diagnosis not present

## 2024-01-09 DIAGNOSIS — M6281 Muscle weakness (generalized): Secondary | ICD-10-CM | POA: Diagnosis not present

## 2024-01-09 DIAGNOSIS — Z471 Aftercare following joint replacement surgery: Secondary | ICD-10-CM | POA: Diagnosis not present

## 2024-01-09 DIAGNOSIS — M25561 Pain in right knee: Secondary | ICD-10-CM | POA: Diagnosis not present

## 2024-01-12 DIAGNOSIS — Z471 Aftercare following joint replacement surgery: Secondary | ICD-10-CM | POA: Diagnosis not present

## 2024-01-12 DIAGNOSIS — M25561 Pain in right knee: Secondary | ICD-10-CM | POA: Diagnosis not present

## 2024-01-12 DIAGNOSIS — M6281 Muscle weakness (generalized): Secondary | ICD-10-CM | POA: Diagnosis not present

## 2024-01-12 DIAGNOSIS — R2689 Other abnormalities of gait and mobility: Secondary | ICD-10-CM | POA: Diagnosis not present

## 2024-01-13 DIAGNOSIS — R2689 Other abnormalities of gait and mobility: Secondary | ICD-10-CM | POA: Diagnosis not present

## 2024-01-13 DIAGNOSIS — M25561 Pain in right knee: Secondary | ICD-10-CM | POA: Diagnosis not present

## 2024-01-13 DIAGNOSIS — Z471 Aftercare following joint replacement surgery: Secondary | ICD-10-CM | POA: Diagnosis not present

## 2024-01-13 DIAGNOSIS — M6281 Muscle weakness (generalized): Secondary | ICD-10-CM | POA: Diagnosis not present

## 2024-01-14 DIAGNOSIS — H6123 Impacted cerumen, bilateral: Secondary | ICD-10-CM | POA: Diagnosis not present

## 2024-01-19 DIAGNOSIS — Z471 Aftercare following joint replacement surgery: Secondary | ICD-10-CM | POA: Diagnosis not present

## 2024-01-19 DIAGNOSIS — M25561 Pain in right knee: Secondary | ICD-10-CM | POA: Diagnosis not present

## 2024-01-19 DIAGNOSIS — R2689 Other abnormalities of gait and mobility: Secondary | ICD-10-CM | POA: Diagnosis not present

## 2024-01-19 DIAGNOSIS — M6281 Muscle weakness (generalized): Secondary | ICD-10-CM | POA: Diagnosis not present

## 2024-01-20 DIAGNOSIS — Z471 Aftercare following joint replacement surgery: Secondary | ICD-10-CM | POA: Diagnosis not present

## 2024-01-20 DIAGNOSIS — M25561 Pain in right knee: Secondary | ICD-10-CM | POA: Diagnosis not present

## 2024-01-20 DIAGNOSIS — R2689 Other abnormalities of gait and mobility: Secondary | ICD-10-CM | POA: Diagnosis not present

## 2024-01-20 DIAGNOSIS — M6281 Muscle weakness (generalized): Secondary | ICD-10-CM | POA: Diagnosis not present

## 2024-01-21 DIAGNOSIS — R2689 Other abnormalities of gait and mobility: Secondary | ICD-10-CM | POA: Diagnosis not present

## 2024-01-21 DIAGNOSIS — Z471 Aftercare following joint replacement surgery: Secondary | ICD-10-CM | POA: Diagnosis not present

## 2024-01-21 DIAGNOSIS — M25561 Pain in right knee: Secondary | ICD-10-CM | POA: Diagnosis not present

## 2024-01-21 DIAGNOSIS — M6281 Muscle weakness (generalized): Secondary | ICD-10-CM | POA: Diagnosis not present

## 2024-01-22 DIAGNOSIS — M6281 Muscle weakness (generalized): Secondary | ICD-10-CM | POA: Diagnosis not present

## 2024-01-22 DIAGNOSIS — Z471 Aftercare following joint replacement surgery: Secondary | ICD-10-CM | POA: Diagnosis not present

## 2024-01-22 DIAGNOSIS — M25561 Pain in right knee: Secondary | ICD-10-CM | POA: Diagnosis not present

## 2024-01-22 DIAGNOSIS — R2689 Other abnormalities of gait and mobility: Secondary | ICD-10-CM | POA: Diagnosis not present

## 2024-01-26 DIAGNOSIS — M6281 Muscle weakness (generalized): Secondary | ICD-10-CM | POA: Diagnosis not present

## 2024-01-26 DIAGNOSIS — M25561 Pain in right knee: Secondary | ICD-10-CM | POA: Diagnosis not present

## 2024-01-26 DIAGNOSIS — R2689 Other abnormalities of gait and mobility: Secondary | ICD-10-CM | POA: Diagnosis not present

## 2024-01-26 DIAGNOSIS — Z471 Aftercare following joint replacement surgery: Secondary | ICD-10-CM | POA: Diagnosis not present

## 2024-01-27 DIAGNOSIS — R7303 Prediabetes: Secondary | ICD-10-CM | POA: Diagnosis not present

## 2024-01-27 DIAGNOSIS — I10 Essential (primary) hypertension: Secondary | ICD-10-CM | POA: Diagnosis not present

## 2024-01-27 DIAGNOSIS — E785 Hyperlipidemia, unspecified: Secondary | ICD-10-CM | POA: Diagnosis not present

## 2024-01-27 DIAGNOSIS — Z1331 Encounter for screening for depression: Secondary | ICD-10-CM | POA: Diagnosis not present

## 2024-01-27 DIAGNOSIS — M810 Age-related osteoporosis without current pathological fracture: Secondary | ICD-10-CM | POA: Diagnosis not present

## 2024-01-27 DIAGNOSIS — H3530 Unspecified macular degeneration: Secondary | ICD-10-CM | POA: Diagnosis not present

## 2024-01-27 DIAGNOSIS — E039 Hypothyroidism, unspecified: Secondary | ICD-10-CM | POA: Diagnosis not present

## 2024-01-27 DIAGNOSIS — I5032 Chronic diastolic (congestive) heart failure: Secondary | ICD-10-CM | POA: Diagnosis not present

## 2024-01-27 DIAGNOSIS — Z0000 Encounter for general adult medical examination without abnormal findings: Secondary | ICD-10-CM | POA: Diagnosis not present

## 2024-01-27 DIAGNOSIS — F411 Generalized anxiety disorder: Secondary | ICD-10-CM | POA: Diagnosis not present

## 2024-01-27 DIAGNOSIS — I4891 Unspecified atrial fibrillation: Secondary | ICD-10-CM | POA: Diagnosis not present

## 2024-01-27 DIAGNOSIS — I700 Atherosclerosis of aorta: Secondary | ICD-10-CM | POA: Diagnosis not present

## 2024-01-28 ENCOUNTER — Other Ambulatory Visit: Payer: Self-pay | Admitting: Family Medicine

## 2024-01-28 DIAGNOSIS — M25561 Pain in right knee: Secondary | ICD-10-CM | POA: Diagnosis not present

## 2024-01-28 DIAGNOSIS — Z780 Asymptomatic menopausal state: Secondary | ICD-10-CM

## 2024-01-28 DIAGNOSIS — Z471 Aftercare following joint replacement surgery: Secondary | ICD-10-CM | POA: Diagnosis not present

## 2024-01-28 DIAGNOSIS — Z13820 Encounter for screening for osteoporosis: Secondary | ICD-10-CM

## 2024-01-28 DIAGNOSIS — M6281 Muscle weakness (generalized): Secondary | ICD-10-CM | POA: Diagnosis not present

## 2024-01-28 DIAGNOSIS — R2689 Other abnormalities of gait and mobility: Secondary | ICD-10-CM | POA: Diagnosis not present

## 2024-01-28 NOTE — Progress Notes (Signed)
 Remote pacemaker transmission.

## 2024-01-29 DIAGNOSIS — M25561 Pain in right knee: Secondary | ICD-10-CM | POA: Diagnosis not present

## 2024-01-29 DIAGNOSIS — H353134 Nonexudative age-related macular degeneration, bilateral, advanced atrophic with subfoveal involvement: Secondary | ICD-10-CM | POA: Diagnosis not present

## 2024-01-29 DIAGNOSIS — M6281 Muscle weakness (generalized): Secondary | ICD-10-CM | POA: Diagnosis not present

## 2024-01-29 DIAGNOSIS — R2689 Other abnormalities of gait and mobility: Secondary | ICD-10-CM | POA: Diagnosis not present

## 2024-01-29 DIAGNOSIS — H40013 Open angle with borderline findings, low risk, bilateral: Secondary | ICD-10-CM | POA: Diagnosis not present

## 2024-01-29 DIAGNOSIS — Z471 Aftercare following joint replacement surgery: Secondary | ICD-10-CM | POA: Diagnosis not present

## 2024-01-29 DIAGNOSIS — H04123 Dry eye syndrome of bilateral lacrimal glands: Secondary | ICD-10-CM | POA: Diagnosis not present

## 2024-01-29 DIAGNOSIS — Z961 Presence of intraocular lens: Secondary | ICD-10-CM | POA: Diagnosis not present

## 2024-01-30 DIAGNOSIS — M6281 Muscle weakness (generalized): Secondary | ICD-10-CM | POA: Diagnosis not present

## 2024-01-30 DIAGNOSIS — M25561 Pain in right knee: Secondary | ICD-10-CM | POA: Diagnosis not present

## 2024-01-30 DIAGNOSIS — Z471 Aftercare following joint replacement surgery: Secondary | ICD-10-CM | POA: Diagnosis not present

## 2024-01-30 DIAGNOSIS — R2689 Other abnormalities of gait and mobility: Secondary | ICD-10-CM | POA: Diagnosis not present

## 2024-02-02 DIAGNOSIS — M6281 Muscle weakness (generalized): Secondary | ICD-10-CM | POA: Diagnosis not present

## 2024-02-02 DIAGNOSIS — R2689 Other abnormalities of gait and mobility: Secondary | ICD-10-CM | POA: Diagnosis not present

## 2024-02-02 DIAGNOSIS — M25561 Pain in right knee: Secondary | ICD-10-CM | POA: Diagnosis not present

## 2024-02-02 DIAGNOSIS — Z471 Aftercare following joint replacement surgery: Secondary | ICD-10-CM | POA: Diagnosis not present

## 2024-02-03 DIAGNOSIS — M6281 Muscle weakness (generalized): Secondary | ICD-10-CM | POA: Diagnosis not present

## 2024-02-03 DIAGNOSIS — Z471 Aftercare following joint replacement surgery: Secondary | ICD-10-CM | POA: Diagnosis not present

## 2024-02-03 DIAGNOSIS — R2689 Other abnormalities of gait and mobility: Secondary | ICD-10-CM | POA: Diagnosis not present

## 2024-02-03 DIAGNOSIS — M25561 Pain in right knee: Secondary | ICD-10-CM | POA: Diagnosis not present

## 2024-02-05 DIAGNOSIS — R2689 Other abnormalities of gait and mobility: Secondary | ICD-10-CM | POA: Diagnosis not present

## 2024-02-05 DIAGNOSIS — Z471 Aftercare following joint replacement surgery: Secondary | ICD-10-CM | POA: Diagnosis not present

## 2024-02-05 DIAGNOSIS — M6281 Muscle weakness (generalized): Secondary | ICD-10-CM | POA: Diagnosis not present

## 2024-02-05 DIAGNOSIS — M25561 Pain in right knee: Secondary | ICD-10-CM | POA: Diagnosis not present

## 2024-02-06 DIAGNOSIS — Z471 Aftercare following joint replacement surgery: Secondary | ICD-10-CM | POA: Diagnosis not present

## 2024-02-06 DIAGNOSIS — R2689 Other abnormalities of gait and mobility: Secondary | ICD-10-CM | POA: Diagnosis not present

## 2024-02-06 DIAGNOSIS — M6281 Muscle weakness (generalized): Secondary | ICD-10-CM | POA: Diagnosis not present

## 2024-02-06 DIAGNOSIS — M25561 Pain in right knee: Secondary | ICD-10-CM | POA: Diagnosis not present

## 2024-02-09 DIAGNOSIS — M1711 Unilateral primary osteoarthritis, right knee: Secondary | ICD-10-CM | POA: Diagnosis not present

## 2024-02-11 ENCOUNTER — Telehealth: Payer: Self-pay

## 2024-02-11 ENCOUNTER — Ambulatory Visit: Admitting: Pulmonary Disease

## 2024-02-11 VITALS — BP 154/76 | HR 77 | Ht 68.0 in | Wt 205.0 lb

## 2024-02-11 DIAGNOSIS — Z471 Aftercare following joint replacement surgery: Secondary | ICD-10-CM | POA: Diagnosis not present

## 2024-02-11 DIAGNOSIS — M6281 Muscle weakness (generalized): Secondary | ICD-10-CM | POA: Diagnosis not present

## 2024-02-11 DIAGNOSIS — G4733 Obstructive sleep apnea (adult) (pediatric): Secondary | ICD-10-CM

## 2024-02-11 DIAGNOSIS — M25561 Pain in right knee: Secondary | ICD-10-CM | POA: Diagnosis not present

## 2024-02-11 DIAGNOSIS — R2689 Other abnormalities of gait and mobility: Secondary | ICD-10-CM | POA: Diagnosis not present

## 2024-02-11 NOTE — Patient Instructions (Signed)
 Continue using your CPAP on a nightly basis  We will trying to get a download from a medical supply company  I will see you a year from now  Call us  with significant concerns

## 2024-02-11 NOTE — Progress Notes (Addendum)
 Brittany [name]    [id number]    [birth year]  Primary Care Physician:Sun, Vyvyan, MD  Referring Physician: Sun, Vyvyan, MD [phone] [name] [place],  [place] [address]  Chief complaint:   Shortness of breath  HPI:  Patient with shortness of breath with exertion History of obstructive sleep apnea - Continues to use CPAP on a regular basis -Still do not have a download from machine  Sleeping well, functioning well Wakes up feeling like she is not a good nights rest Not unusually sleepy during the day  Shortness of breath with but with activity  History of diastolic heart failure, follows up with the heart failure team  Recently had right knee surgery  Recently had a CT scan of the chest showing some interstitial changes but not significant enough to be contributing to shortness of breath  PFT in 2023 did not reveal significant obstruction or restriction  Reformed smoker quit many years ago  Outpatient Encounter Medications as of 02/11/2024  Medication Sig   acetaminophen  (TYLENOL ) 500 MG tablet Take 500 mg by mouth 2 (two) times daily.   alendronate  (FOSAMAX ) 70 MG tablet Take 1 tablet (70 mg total) by mouth every 7 (seven) days. Take with a full glass of water  on an empty stomach. (Patient taking differently: Take 70 mg by mouth every Wednesday. Take with a full glass of water  on an empty stomach.)   apixaban  (ELIQUIS ) 5 MG TABS tablet Take 1 tablet (5 mg total) by mouth 2 (two) times daily.   atorvastatin  (LIPITOR) 40 MG tablet Take 40 mg by mouth in the morning.   B Complex-C (B-COMPLEX WITH VITAMIN C) tablet Take 1 tablet by mouth in the morning.   Cholecalciferol  (VITAMIN D3) 125 MCG (5000 UT) TABS Take 5,000 Units by mouth daily with breakfast.   dapagliflozin  propanediol (FARXIGA ) 10 MG TABS tablet Take 1 tablet (10 mg total) by mouth daily before breakfast.   digoxin  (LANOXIN ) 0.125 MG tablet Take 1 tablet (0.125 mg total) by mouth daily.    furosemide  (LASIX ) 40 MG tablet Take 2 tablets (80 mg total) by mouth every other day. TAKE 80 MG ALTERNATING 40 MG EVERY OTHER DAY (Patient taking differently: Take 40-80 mg by mouth every other day. TAKE 80 MG ALTERNATING 40 MG EVERY OTHER DAY)   HYDROcodone -acetaminophen  (NORCO/VICODIN) 5-325 MG tablet Take 1 tablet by mouth every 6 (six) hours as needed for moderate pain (pain score 4-6) or severe pain (pain score 7-10) (post op pain).   levothyroxine  (SYNTHROID ) 88 MCG tablet Take 88 mcg by mouth daily before breakfast.   Lutein  20 MG TABS Take 20 mg by mouth daily at 12 noon.   magnesium  oxide (MAG-OX) 400 MG tablet Take 1 tablet (400 mg total) by mouth daily.   Melatonin 10 MG TABS Take 10-40 mg by mouth at bedtime as needed (sleep).   metoprolol  succinate (TOPROL -XL) 50 MG 24 hr tablet 1 tablet Orally twice a day   Multiple Vitamin (MULTIVITAMIN WITH MINERALS) TABS tablet Take 1 tablet by mouth daily at 12 noon.   Multiple Vitamins-Minerals (PRESERVISION AREDS 2 PO) Take 1 capsule by mouth 2 (two) times daily with a meal.    sertraline  (ZOLOFT ) 50 MG tablet Take 1 tablet (50 mg total) by mouth daily.   spironolactone  (ALDACTONE ) 25 MG tablet Take 1 tablet (25 mg total) by mouth daily. (Patient taking differently: Take 25 mg by mouth daily at 12 noon.)   No  facility-administered encounter medications on file as of 02/11/2024.    Allergies as of 02/11/2024 - Review Complete 02/11/2024  Allergen Reaction Noted   Citalopram  hydrobromide Other (See Comments) 08/20/2021   Adhesive [tape] Swelling and Rash 02/23/2013   Bacitracin-polymyxin b Rash 08/20/2021   Benzalkonium chloride Rash 11/22/2015   Neosporin [neomycin-polymyxin-gramicidin] Swelling and Rash 02/23/2013    Past Medical History:  Diagnosis Date   Ankle fracture 2016   Arthritis    Atrial fibrillation (HCC)    Depression    Dysrhythmia    Elevated parathyroid hormone    Fracture of orbital floor with routine healing     GERD (gastroesophageal reflux disease)    History of colon polyps    benign   Hypercalcemia    Hypertension    Hyponatremia    Hypothyroid    Macular degeneration    wet in the right and dry in the left   OSA on CPAP    Osteoporosis    Peripheral vascular disease (HCC)    Presence of permanent cardiac pacemaker    PSVT (paroxysmal supraventricular tachycardia) (HCC)    Rotator cuff tear    Sinus node dysfunction (HCC)    a. s/p MDT pacemaker   Stroke (HCC)    Urinary incontinence    Wrist fracture 2018    Past Surgical History:  Procedure Laterality Date   BACK SURGERY     CARDIOVERSION N/A 05/02/2022   Procedure: CARDIOVERSION;  Surgeon: Mona Vinie BROCKS, MD;  Location: [facility] ENDOSCOPY;  Service: Cardiovascular;  Laterality: N/A;   CATARACT EXTRACTION     CATARACT EXTRACTION W/ INTRAOCULAR LENS  IMPLANT, BILATERAL Bilateral    COLONOSCOPY     ESOPHAGOGASTRODUODENOSCOPY     IR ANGIO INTRA EXTRACRAN SEL COM CAROTID INNOMINATE BILAT MOD SED  12/30/2017   IR ANGIO VERTEBRAL SEL VERTEBRAL BILAT MOD SED  12/30/2017   LAPAROSCOPIC CHOLECYSTECTOMY  08/11/1999   LUMBAR DISC SURGERY  02/19/2014   ORIF ANKLE FRACTURE Left 04/13/2015   Procedure: OPEN REDUCTION INTERNAL FIXATION (ORIF) LEFT ANKLE FRACTURE;  Surgeon: Glendia Cordella Hutchinson, MD;  Location: [facility] OR;  Service: Orthopedics;  Laterality: Left;   PACEMAKER PLACEMENT Right 07/22/2010   a. MDT dual chamber PPM implanted by Dr Fernande    [facility] GENERATOR CHANGEOUT N/A 08/22/2022   Procedure: PPM GENERATOR HERMA;  Surgeon: Fernande Elspeth BROCKS, MD;  Location: [facility] INVASIVE CV LAB;  Service: Cardiovascular;  Laterality: N/A;   SHOULDER ARTHROSCOPY W/ ROTATOR CUFF REPAIR Right 08/30/2014   WITH MINI-OPEN ROTATOR CUFF REPAIR AND SUBACROMIAL DECOMPRESSION   SHOULDER ARTHROSCOPY WITH ROTATOR CUFF REPAIR AND SUBACROMIAL DECOMPRESSION Right 08/30/2014   Procedure: SHOULDER ARTHROSCOPY WITH MINI-OPEN ROTATOR CUFF REPAIR AND SUBACROMIAL DECOMPRESSION;   Surgeon: Cordella Glendia Hutchinson, MD;  Location: [facility] OR;  Service: Orthopedics;  Laterality: Right;  RIGHT SHOULDER DIAGNOSTIC OPERATIVE ARTHROSCOPY, SUBACROMIAL DECOMPRESSION, MINI-OPEN ROTATOR CUFF REPAIR.   TOTAL HIP ARTHROPLASTY Right 08/14/2021   Procedure: TOTAL HIP ARTHROPLASTY ANTERIOR APPROACH;  Surgeon: Sheril Coy, MD;  Location: [facility] ORS;  Service: Orthopedics;  Laterality: Right;   TOTAL KNEE ARTHROPLASTY Left 02/13/2021   Procedure: LEFT TOTAL KNEE ARTHROPLASTY;  Surgeon: Sheril Coy, MD;  Location: [facility] ORS;  Service: Orthopedics;  Laterality: Left;   TOTAL KNEE ARTHROPLASTY Right 11/11/2023   Procedure: ARTHROPLASTY, KNEE, TOTAL;  Surgeon: Sheril Coy, MD;  Location: [facility] ORS;  Service: Orthopedics;  Laterality: Right;  RIGHT KNEE ARTHROPLASTY   VAGINAL HYSTERECTOMY  07/22/1968   WRIST FRACTURE SURGERY Left     Family History  Problem Relation Age of Onset   Colon cancer Mother        colon   Cancer Father        unknown type   Stroke Sister    Skin cancer Brother    Heart disease Brother    Breast cancer Paternal Aunt    Asthma Brother     Social History   Socioeconomic History   Marital status: Married    Spouse name: Not on file   Number of children: 4   Years of education: 12   Highest education level: Not on file  Occupational History   Occupation: Office manager     Comment: retired  Tobacco Use   Smoking status: Former    Current packs/day: 0.00    Average packs/day: 1 pack/day for 45.0 years (45.0 ttl pk-yrs)    Types: Cigarettes    Start date: 40    Quit date: 1992    Years since quitting: 33.5   Smokeless tobacco: Never   Tobacco comments:    quit smoking in 1992  Vaping Use   Vaping status: Never Used  Substance and Sexual Activity   Alcohol use: Yes    Alcohol/week: 28.0 standard drinks of alcohol    Types: 28 Glasses of wine per week    Comment: 16oz wine at dinner time   Drug use: No   Sexual activity: Yes    Birth  control/protection: Surgical  Other Topics Concern   Not on file  Social History Narrative   Ms. Bloxom lives with her husband at [employer]. They are retired and recently relocated to [facility] from [place]. She has 4 grown children.    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         Right handed   Wear glasses    Drinks 2 cups coffee in am   1 diet coke a day and 1 sprite zero per day    Social Drivers of Health   Financial Resource Strain: Not on file  Food Insecurity: No Food Insecurity (12/02/2022)   Hunger Vital Sign    Worried About Running Out of Food in the Last Year: Never true    Ran Out of Food in the Last Year: Never true  Transportation Needs: No Transportation Needs (12/02/2022)   PRAPARE - Administrator, Civil Service (Medical): No    Lack of Transportation (Non-Medical): No  Physical Activity: Not on file  Stress: Not on file  Social Connections: Not on file  Intimate Partner Violence: Not At Risk (12/02/2022)   Humiliation, Afraid, Rape, and Kick questionnaire    Fear of Current or Ex-Partner: No    Emotionally Abused: No    Physically Abused: No    Sexually Abused: No    Review of Systems  Respiratory:  Positive for apnea and shortness of breath.   Psychiatric/Behavioral:  Positive for sleep disturbance.     Vitals:   02/11/24 1029  BP: (!) 154/76  Pulse: 77  SpO2: 97%     Physical Exam Constitutional:      Appearance: She is obese.  HENT:     Head: Normocephalic.     Mouth/Throat:     Mouth: Mucous membranes are moist.  Eyes:     General: No scleral icterus. Cardiovascular:     Rate and Rhythm: Normal rate and regular rhythm.     Heart sounds: No murmur heard.    No friction rub.  Pulmonary:     Effort: No respiratory distress.     Breath sounds: No stridor. No wheezing or rhonchi.  Musculoskeletal:     Cervical back: No rigidity or tenderness.  Neurological:     Mental Status: She is  alert.  Psychiatric:        Mood and Affect: Mood normal.    Data Reviewed: Echocardiogram 12/02/2022-normal ejection fraction, biatrial dilatation  PFT 11/06/2021-no obstruction, no restriction, no significant bronchodilator response  CT chest 09/12/2023-some groundglass changes, some calcified granuloma, some interstitial changes  Had had a previous CPET showing deconditioning has been contributory to her limitations  Assessment:  Shortness of breath on exertion -Multifactorial - Heart failure with preserved ejection fraction - Deconditioning  Obstructive sleep apnea - We do not have a download from the machine but patient claims good compliance and benefit -Continues to benefit from CPAP  Plan/Recommendations: Continue CPAP nightly  Call us  with significant concerns  Follow-up a year from now  We will continue to try and get a download -Contact adapt   Jennet Epley MD [facility] 02/11/2024, 10:35 AM  CC: Sun, Vyvyan, MD  Download received 86.7% compliance Average as of 9 hours 24 minutes AHI of 5.7 CPAP pressure of 8 with C-Flex

## 2024-02-13 DIAGNOSIS — M6281 Muscle weakness (generalized): Secondary | ICD-10-CM | POA: Diagnosis not present

## 2024-02-13 DIAGNOSIS — M25561 Pain in right knee: Secondary | ICD-10-CM | POA: Diagnosis not present

## 2024-02-13 DIAGNOSIS — Z471 Aftercare following joint replacement surgery: Secondary | ICD-10-CM | POA: Diagnosis not present

## 2024-02-13 DIAGNOSIS — R2689 Other abnormalities of gait and mobility: Secondary | ICD-10-CM | POA: Diagnosis not present

## 2024-02-19 DIAGNOSIS — M6281 Muscle weakness (generalized): Secondary | ICD-10-CM | POA: Diagnosis not present

## 2024-02-19 DIAGNOSIS — Z471 Aftercare following joint replacement surgery: Secondary | ICD-10-CM | POA: Diagnosis not present

## 2024-02-19 DIAGNOSIS — R2689 Other abnormalities of gait and mobility: Secondary | ICD-10-CM | POA: Diagnosis not present

## 2024-02-19 DIAGNOSIS — M25561 Pain in right knee: Secondary | ICD-10-CM | POA: Diagnosis not present

## 2024-02-20 DIAGNOSIS — Z471 Aftercare following joint replacement surgery: Secondary | ICD-10-CM | POA: Diagnosis not present

## 2024-02-20 DIAGNOSIS — R2689 Other abnormalities of gait and mobility: Secondary | ICD-10-CM | POA: Diagnosis not present

## 2024-02-20 DIAGNOSIS — M6281 Muscle weakness (generalized): Secondary | ICD-10-CM | POA: Diagnosis not present

## 2024-02-20 DIAGNOSIS — M25561 Pain in right knee: Secondary | ICD-10-CM | POA: Diagnosis not present

## 2024-02-25 DIAGNOSIS — M25561 Pain in right knee: Secondary | ICD-10-CM | POA: Diagnosis not present

## 2024-02-25 DIAGNOSIS — Z471 Aftercare following joint replacement surgery: Secondary | ICD-10-CM | POA: Diagnosis not present

## 2024-02-25 DIAGNOSIS — R2689 Other abnormalities of gait and mobility: Secondary | ICD-10-CM | POA: Diagnosis not present

## 2024-02-25 DIAGNOSIS — M6281 Muscle weakness (generalized): Secondary | ICD-10-CM | POA: Diagnosis not present

## 2024-02-27 DIAGNOSIS — R2689 Other abnormalities of gait and mobility: Secondary | ICD-10-CM | POA: Diagnosis not present

## 2024-02-27 DIAGNOSIS — M6281 Muscle weakness (generalized): Secondary | ICD-10-CM | POA: Diagnosis not present

## 2024-02-27 DIAGNOSIS — M25561 Pain in right knee: Secondary | ICD-10-CM | POA: Diagnosis not present

## 2024-02-27 DIAGNOSIS — Z471 Aftercare following joint replacement surgery: Secondary | ICD-10-CM | POA: Diagnosis not present

## 2024-03-02 DIAGNOSIS — R200 Anesthesia of skin: Secondary | ICD-10-CM | POA: Diagnosis not present

## 2024-03-02 DIAGNOSIS — Z471 Aftercare following joint replacement surgery: Secondary | ICD-10-CM | POA: Diagnosis not present

## 2024-03-02 DIAGNOSIS — R2689 Other abnormalities of gait and mobility: Secondary | ICD-10-CM | POA: Diagnosis not present

## 2024-03-02 DIAGNOSIS — M6281 Muscle weakness (generalized): Secondary | ICD-10-CM | POA: Diagnosis not present

## 2024-03-02 DIAGNOSIS — M25561 Pain in right knee: Secondary | ICD-10-CM | POA: Diagnosis not present

## 2024-03-04 DIAGNOSIS — M6281 Muscle weakness (generalized): Secondary | ICD-10-CM | POA: Diagnosis not present

## 2024-03-04 DIAGNOSIS — R2689 Other abnormalities of gait and mobility: Secondary | ICD-10-CM | POA: Diagnosis not present

## 2024-03-04 DIAGNOSIS — M25561 Pain in right knee: Secondary | ICD-10-CM | POA: Diagnosis not present

## 2024-03-04 DIAGNOSIS — Z471 Aftercare following joint replacement surgery: Secondary | ICD-10-CM | POA: Diagnosis not present

## 2024-03-08 DIAGNOSIS — M79641 Pain in right hand: Secondary | ICD-10-CM | POA: Diagnosis not present

## 2024-03-08 DIAGNOSIS — R200 Anesthesia of skin: Secondary | ICD-10-CM | POA: Diagnosis not present

## 2024-03-08 DIAGNOSIS — R202 Paresthesia of skin: Secondary | ICD-10-CM | POA: Diagnosis not present

## 2024-03-08 NOTE — Progress Notes (Signed)
  Hand / Upper Extremity Injection/Arthrocentesis for carpal tunnel syndrome on 03/08/2024 1:00 PM Indications: tendon swelling and pain Details: 27 G needle, volar approach Medications: 10 mg lidocaine  10 mg/mL (1 %); 6 mg betamethasone  acetate and sodium phosphate  6 mg/mL Outcome: tolerated well, no immediate complications Procedure, treatment alternatives, risks and benefits explained, specific risks discussed. Consent was given by the patient. Immediately prior to procedure a time out was called to verify the correct patient, procedure, equipment, support staff and site/side marked as required. Patient was prepped and draped in the usual sterile fashion.    Allison Rose Cosentino, PA-C

## 2024-03-09 DIAGNOSIS — M6281 Muscle weakness (generalized): Secondary | ICD-10-CM | POA: Diagnosis not present

## 2024-03-09 DIAGNOSIS — R2689 Other abnormalities of gait and mobility: Secondary | ICD-10-CM | POA: Diagnosis not present

## 2024-03-09 DIAGNOSIS — M9903 Segmental and somatic dysfunction of lumbar region: Secondary | ICD-10-CM | POA: Diagnosis not present

## 2024-03-09 DIAGNOSIS — M9901 Segmental and somatic dysfunction of cervical region: Secondary | ICD-10-CM | POA: Diagnosis not present

## 2024-03-09 DIAGNOSIS — M9904 Segmental and somatic dysfunction of sacral region: Secondary | ICD-10-CM | POA: Diagnosis not present

## 2024-03-09 DIAGNOSIS — M25561 Pain in right knee: Secondary | ICD-10-CM | POA: Diagnosis not present

## 2024-03-09 DIAGNOSIS — Z471 Aftercare following joint replacement surgery: Secondary | ICD-10-CM | POA: Diagnosis not present

## 2024-03-09 DIAGNOSIS — M9902 Segmental and somatic dysfunction of thoracic region: Secondary | ICD-10-CM | POA: Diagnosis not present

## 2024-03-11 ENCOUNTER — Ambulatory Visit (INDEPENDENT_AMBULATORY_CARE_PROVIDER_SITE_OTHER)

## 2024-03-11 DIAGNOSIS — Z471 Aftercare following joint replacement surgery: Secondary | ICD-10-CM | POA: Diagnosis not present

## 2024-03-11 DIAGNOSIS — R2689 Other abnormalities of gait and mobility: Secondary | ICD-10-CM | POA: Diagnosis not present

## 2024-03-11 DIAGNOSIS — I495 Sick sinus syndrome: Secondary | ICD-10-CM | POA: Diagnosis not present

## 2024-03-11 DIAGNOSIS — M6281 Muscle weakness (generalized): Secondary | ICD-10-CM | POA: Diagnosis not present

## 2024-03-11 DIAGNOSIS — M25561 Pain in right knee: Secondary | ICD-10-CM | POA: Diagnosis not present

## 2024-03-11 LAB — CUP PACEART REMOTE DEVICE CHECK
Battery Remaining Longevity: 146 mo
Battery Voltage: 3.03 V
Brady Statistic RA Percent Paced: 0 %
Brady Statistic RV Percent Paced: 51.24 %
Date Time Interrogation Session: 20250820221148
Implantable Lead Connection Status: 753985
Implantable Lead Connection Status: 753985
Implantable Lead Implant Date: 20121213
Implantable Lead Implant Date: 20121213
Implantable Lead Location: 753859
Implantable Lead Location: 753860
Implantable Lead Model: 4076
Implantable Lead Model: 4076
Implantable Lead Serial Number: 835025
Implantable Lead Serial Number: 869269
Implantable Pulse Generator Implant Date: 20240201
Lead Channel Impedance Value: 304 Ohm
Lead Channel Impedance Value: 380 Ohm
Lead Channel Impedance Value: 399 Ohm
Lead Channel Impedance Value: 532 Ohm
Lead Channel Pacing Threshold Amplitude: 0.875 V
Lead Channel Pacing Threshold Pulse Width: 0.4 ms
Lead Channel Sensing Intrinsic Amplitude: 0.75 mV
Lead Channel Sensing Intrinsic Amplitude: 0.75 mV
Lead Channel Sensing Intrinsic Amplitude: 6.25 mV
Lead Channel Sensing Intrinsic Amplitude: 6.25 mV
Lead Channel Setting Pacing Amplitude: 2 V
Lead Channel Setting Pacing Pulse Width: 0.4 ms
Lead Channel Setting Sensing Sensitivity: 0.9 mV
Zone Setting Status: 755011
Zone Setting Status: 755011

## 2024-03-14 ENCOUNTER — Encounter (HOSPITAL_COMMUNITY): Payer: Self-pay | Admitting: Hospitalist

## 2024-03-14 ENCOUNTER — Other Ambulatory Visit: Payer: Self-pay

## 2024-03-14 ENCOUNTER — Inpatient Hospital Stay (HOSPITAL_COMMUNITY)
Admission: EM | Admit: 2024-03-14 | Discharge: 2024-03-16 | DRG: 083 | Disposition: A | Discharge status: Home Health | Attending: Internal Medicine | Admitting: Internal Medicine

## 2024-03-14 ENCOUNTER — Emergency Department (HOSPITAL_COMMUNITY)

## 2024-03-14 ENCOUNTER — Observation Stay (HOSPITAL_COMMUNITY)

## 2024-03-14 DIAGNOSIS — M810 Age-related osteoporosis without current pathological fracture: Secondary | ICD-10-CM | POA: Diagnosis present

## 2024-03-14 DIAGNOSIS — Z7901 Long term (current) use of anticoagulants: Secondary | ICD-10-CM | POA: Diagnosis not present

## 2024-03-14 DIAGNOSIS — I6200 Nontraumatic subdural hemorrhage, unspecified: Secondary | ICD-10-CM | POA: Diagnosis not present

## 2024-03-14 DIAGNOSIS — I160 Hypertensive urgency: Secondary | ICD-10-CM | POA: Diagnosis present

## 2024-03-14 DIAGNOSIS — Z8249 Family history of ischemic heart disease and other diseases of the circulatory system: Secondary | ICD-10-CM | POA: Diagnosis not present

## 2024-03-14 DIAGNOSIS — W1830XA Fall on same level, unspecified, initial encounter: Secondary | ICD-10-CM | POA: Diagnosis present

## 2024-03-14 DIAGNOSIS — Z800 Family history of malignant neoplasm of digestive organs: Secondary | ICD-10-CM

## 2024-03-14 DIAGNOSIS — Z803 Family history of malignant neoplasm of breast: Secondary | ICD-10-CM | POA: Diagnosis not present

## 2024-03-14 DIAGNOSIS — Z96653 Presence of artificial knee joint, bilateral: Secondary | ICD-10-CM | POA: Diagnosis present

## 2024-03-14 DIAGNOSIS — I10 Essential (primary) hypertension: Secondary | ICD-10-CM | POA: Diagnosis present

## 2024-03-14 DIAGNOSIS — G4733 Obstructive sleep apnea (adult) (pediatric): Secondary | ICD-10-CM | POA: Diagnosis present

## 2024-03-14 DIAGNOSIS — Z961 Presence of intraocular lens: Secondary | ICD-10-CM | POA: Diagnosis present

## 2024-03-14 DIAGNOSIS — Z79890 Hormone replacement therapy (postmenopausal): Secondary | ICD-10-CM

## 2024-03-14 DIAGNOSIS — E871 Hypo-osmolality and hyponatremia: Secondary | ICD-10-CM | POA: Diagnosis present

## 2024-03-14 DIAGNOSIS — Z8781 Personal history of (healed) traumatic fracture: Secondary | ICD-10-CM

## 2024-03-14 DIAGNOSIS — Z66 Do not resuscitate: Secondary | ICD-10-CM | POA: Diagnosis present

## 2024-03-14 DIAGNOSIS — R569 Unspecified convulsions: Secondary | ICD-10-CM | POA: Diagnosis not present

## 2024-03-14 DIAGNOSIS — R140 Abdominal distension (gaseous): Secondary | ICD-10-CM | POA: Diagnosis not present

## 2024-03-14 DIAGNOSIS — I739 Peripheral vascular disease, unspecified: Secondary | ICD-10-CM | POA: Diagnosis present

## 2024-03-14 DIAGNOSIS — E039 Hypothyroidism, unspecified: Secondary | ICD-10-CM | POA: Diagnosis present

## 2024-03-14 DIAGNOSIS — I609 Nontraumatic subarachnoid hemorrhage, unspecified: Principal | ICD-10-CM

## 2024-03-14 DIAGNOSIS — Z860100 Personal history of colon polyps, unspecified: Secondary | ICD-10-CM

## 2024-03-14 DIAGNOSIS — Z950 Presence of cardiac pacemaker: Secondary | ICD-10-CM | POA: Diagnosis not present

## 2024-03-14 DIAGNOSIS — Z87891 Personal history of nicotine dependence: Secondary | ICD-10-CM

## 2024-03-14 DIAGNOSIS — R9082 White matter disease, unspecified: Secondary | ICD-10-CM | POA: Diagnosis not present

## 2024-03-14 DIAGNOSIS — Z8673 Personal history of transient ischemic attack (TIA), and cerebral infarction without residual deficits: Secondary | ICD-10-CM | POA: Diagnosis not present

## 2024-03-14 DIAGNOSIS — I6529 Occlusion and stenosis of unspecified carotid artery: Secondary | ICD-10-CM | POA: Diagnosis not present

## 2024-03-14 DIAGNOSIS — Z9842 Cataract extraction status, left eye: Secondary | ICD-10-CM

## 2024-03-14 DIAGNOSIS — Z823 Family history of stroke: Secondary | ICD-10-CM

## 2024-03-14 DIAGNOSIS — Z90710 Acquired absence of both cervix and uterus: Secondary | ICD-10-CM

## 2024-03-14 DIAGNOSIS — S066X0D Traumatic subarachnoid hemorrhage without loss of consciousness, subsequent encounter: Secondary | ICD-10-CM | POA: Diagnosis not present

## 2024-03-14 DIAGNOSIS — R930 Abnormal findings on diagnostic imaging of skull and head, not elsewhere classified: Secondary | ICD-10-CM | POA: Diagnosis not present

## 2024-03-14 DIAGNOSIS — Z7983 Long term (current) use of bisphosphonates: Secondary | ICD-10-CM

## 2024-03-14 DIAGNOSIS — S065X0A Traumatic subdural hemorrhage without loss of consciousness, initial encounter: Secondary | ICD-10-CM | POA: Diagnosis not present

## 2024-03-14 DIAGNOSIS — R55 Syncope and collapse: Secondary | ICD-10-CM | POA: Diagnosis not present

## 2024-03-14 DIAGNOSIS — S065XAA Traumatic subdural hemorrhage with loss of consciousness status unknown, initial encounter: Secondary | ICD-10-CM | POA: Diagnosis not present

## 2024-03-14 DIAGNOSIS — Z808 Family history of malignant neoplasm of other organs or systems: Secondary | ICD-10-CM

## 2024-03-14 DIAGNOSIS — S0636AA Traumatic hemorrhage of cerebrum, unspecified, with loss of consciousness status unknown, initial encounter: Secondary | ICD-10-CM | POA: Diagnosis not present

## 2024-03-14 DIAGNOSIS — Z9841 Cataract extraction status, right eye: Secondary | ICD-10-CM

## 2024-03-14 DIAGNOSIS — R402412 Glasgow coma scale score 13-15, at arrival to emergency department: Secondary | ICD-10-CM | POA: Diagnosis present

## 2024-03-14 DIAGNOSIS — W19XXXA Unspecified fall, initial encounter: Secondary | ICD-10-CM | POA: Diagnosis not present

## 2024-03-14 DIAGNOSIS — M5030 Other cervical disc degeneration, unspecified cervical region: Secondary | ICD-10-CM | POA: Diagnosis not present

## 2024-03-14 DIAGNOSIS — Z825 Family history of asthma and other chronic lower respiratory diseases: Secondary | ICD-10-CM

## 2024-03-14 DIAGNOSIS — M8580 Other specified disorders of bone density and structure, unspecified site: Secondary | ICD-10-CM | POA: Diagnosis not present

## 2024-03-14 DIAGNOSIS — R531 Weakness: Secondary | ICD-10-CM | POA: Diagnosis not present

## 2024-03-14 DIAGNOSIS — Z043 Encounter for examination and observation following other accident: Secondary | ICD-10-CM | POA: Diagnosis not present

## 2024-03-14 DIAGNOSIS — Z96641 Presence of right artificial hip joint: Secondary | ICD-10-CM | POA: Diagnosis present

## 2024-03-14 DIAGNOSIS — I482 Chronic atrial fibrillation, unspecified: Secondary | ICD-10-CM | POA: Diagnosis present

## 2024-03-14 DIAGNOSIS — I517 Cardiomegaly: Secondary | ICD-10-CM | POA: Diagnosis not present

## 2024-03-14 DIAGNOSIS — S066X0A Traumatic subarachnoid hemorrhage without loss of consciousness, initial encounter: Secondary | ICD-10-CM | POA: Diagnosis not present

## 2024-03-14 LAB — COMPREHENSIVE METABOLIC PANEL WITH GFR
ALT: 24 U/L (ref 0–44)
AST: 30 U/L (ref 15–41)
Albumin: 3.8 g/dL (ref 3.5–5.0)
Alkaline Phosphatase: 83 U/L (ref 38–126)
Anion gap: 10 (ref 5–15)
BUN: 11 mg/dL (ref 8–23)
CO2: 23 mmol/L (ref 22–32)
Calcium: 9.9 mg/dL (ref 8.9–10.3)
Chloride: 97 mmol/L — ABNORMAL LOW (ref 98–111)
Creatinine, Ser: 0.87 mg/dL (ref 0.44–1.00)
GFR, Estimated: >60 mL/min (ref >60)
Glucose, Bld: 130 mg/dL — ABNORMAL HIGH (ref 70–99)
Potassium: 5.4 mmol/L — ABNORMAL HIGH (ref 3.5–5.1)
Sodium: 130 mmol/L — ABNORMAL LOW (ref 135–145)
Total Bilirubin: 1.2 mg/dL (ref 0.0–1.2)
Total Protein: 6.6 g/dL (ref 6.5–8.1)

## 2024-03-14 LAB — CBG MONITORING, ED: Glucose-Capillary: 133 mg/dL — ABNORMAL HIGH (ref 70–99)

## 2024-03-14 LAB — CBC
HCT: 41.5 % (ref 36.0–46.0)
Hemoglobin: 13.5 g/dL (ref 12.0–15.0)
MCH: 30.1 pg (ref 26.0–34.0)
MCHC: 32.5 g/dL (ref 30.0–36.0)
MCV: 92.6 fL (ref 80.0–100.0)
Platelets: 267 K/uL (ref 150–400)
RBC: 4.48 MIL/uL (ref 3.87–5.11)
RDW: 15.9 % — ABNORMAL HIGH (ref 11.5–15.5)
WBC: 9.6 K/uL (ref 4.0–10.5)
nRBC: 0 % (ref 0.0–0.2)

## 2024-03-14 LAB — ETHANOL: Alcohol, Ethyl (B): <15 mg/dL (ref <15)

## 2024-03-14 LAB — APTT: aPTT: 33 s (ref 24–36)

## 2024-03-14 LAB — PROTIME-INR
INR: 1.2 (ref 0.8–1.2)
Prothrombin Time: 16 s — ABNORMAL HIGH (ref 11.4–15.2)

## 2024-03-14 LAB — TROPONIN I (HIGH SENSITIVITY)
Troponin I (High Sensitivity): 35 ng/L — ABNORMAL HIGH (ref <18)
Troponin I (High Sensitivity): 31 ng/L — ABNORMAL HIGH (ref <18)

## 2024-03-14 LAB — HEPARIN LEVEL (UNFRACTIONATED): Heparin Unfractionated: >1.1 [IU]/mL — ABNORMAL HIGH (ref 0.30–0.70)

## 2024-03-14 MED ORDER — LIDOCAINE 5 % EX PTCH
1.0000 | MEDICATED_PATCH | Freq: Once | CUTANEOUS | Status: AC
  Administered 2024-03-14: 1 via TRANSDERMAL
  Filled 2024-03-14: qty 1

## 2024-03-14 MED ORDER — ACETAMINOPHEN 325 MG PO TABS
650.0000 mg | ORAL_TABLET | Freq: Four times a day (QID) | ORAL | Status: DC | PRN
  Administered 2024-03-14 – 2024-03-16 (×4): 650 mg via ORAL
  Filled 2024-03-14 (×4): qty 2

## 2024-03-14 MED ORDER — FENTANYL CITRATE PF 50 MCG/ML IJ SOSY
25.0000 ug | PREFILLED_SYRINGE | Freq: Once | INTRAMUSCULAR | Status: AC
  Administered 2024-03-14: 25 ug via INTRAVENOUS
  Filled 2024-03-14: qty 1

## 2024-03-14 MED ORDER — SODIUM CHLORIDE 0.9 % IV SOLN: INTRAVENOUS | Status: AC

## 2024-03-14 MED ORDER — ONDANSETRON HCL 4 MG/2ML IJ SOLN
4.0000 mg | Freq: Once | INTRAMUSCULAR | Status: AC
  Administered 2024-03-14: 4 mg via INTRAVENOUS
  Filled 2024-03-14: qty 2

## 2024-03-14 MED ORDER — LEVOTHYROXINE SODIUM 88 MCG PO TABS
88.0000 ug | ORAL_TABLET | Freq: Every day | ORAL | Status: DC
  Administered 2024-03-15 – 2024-03-16 (×2): 88 ug via ORAL
  Filled 2024-03-14 (×2): qty 1

## 2024-03-14 MED ORDER — SPIRONOLACTONE 25 MG PO TABS
25.0000 mg | ORAL_TABLET | Freq: Every day | ORAL | Status: DC
  Administered 2024-03-14 – 2024-03-16 (×3): 25 mg via ORAL
  Filled 2024-03-14 (×3): qty 1

## 2024-03-14 MED ORDER — METOPROLOL SUCCINATE ER 50 MG PO TB24
50.0000 mg | ORAL_TABLET | Freq: Every day | ORAL | Status: DC
  Administered 2024-03-14 – 2024-03-16 (×3): 50 mg via ORAL
  Filled 2024-03-14 (×2): qty 1
  Filled 2024-03-14: qty 2

## 2024-03-14 MED ORDER — FUROSEMIDE 40 MG PO TABS
40.0000 mg | ORAL_TABLET | Freq: Every day | ORAL | Status: DC
Start: 2024-03-14 — End: 2024-03-16
  Administered 2024-03-15 – 2024-03-16 (×2): 40 mg via ORAL
  Filled 2024-03-14 (×3): qty 1

## 2024-03-14 MED ORDER — HYDRALAZINE HCL 20 MG/ML IJ SOLN
10.0000 mg | Freq: Once | INTRAMUSCULAR | Status: AC
  Administered 2024-03-14: 10 mg via INTRAVENOUS
  Filled 2024-03-14: qty 1

## 2024-03-14 MED ORDER — NICARDIPINE HCL IN NACL 20-0.86 MG/200ML-% IV SOLN
0.0000 mg/h | INTRAVENOUS | Status: DC
  Administered 2024-03-14: 5 mg/h via INTRAVENOUS
  Filled 2024-03-14: qty 200

## 2024-03-14 MED ORDER — SERTRALINE HCL 25 MG PO TABS
50.0000 mg | ORAL_TABLET | Freq: Every day | ORAL | Status: DC
  Administered 2024-03-14 – 2024-03-16 (×3): 50 mg via ORAL
  Filled 2024-03-14 (×3): qty 2

## 2024-03-14 MED ORDER — ATORVASTATIN CALCIUM 40 MG PO TABS
40.0000 mg | ORAL_TABLET | Freq: Every morning | ORAL | Status: DC
  Administered 2024-03-15 – 2024-03-16 (×2): 40 mg via ORAL
  Filled 2024-03-14 (×3): qty 1

## 2024-03-14 MED ORDER — FUROSEMIDE 20 MG PO TABS
40.0000 mg | ORAL_TABLET | Freq: Once | ORAL | Status: AC
  Administered 2024-03-14: 40 mg via ORAL
  Filled 2024-03-14: qty 2

## 2024-03-14 NOTE — Consult Note (Addendum)
 NAME:  Brittany [name], MRN:  [id number], DOB:  07-09-1934, LOS: 0 ADMISSION DATE:  03/14/2024 CHIEF COMPLAINT:  Fall Consulting service: ED.    History of Present Illness:  89 year old female with past medical history of A-fib on Eliquis , hypertension, OSA on CPAP and bilateral TKR with recent right TKR who was found down in her walk-in closet.  On awakening she had a headache.  She was brought into the hospital by EMS.  CT head showed small subdural hematoma [4 mm] on the left para falacine area and a 2 cm area concerning for subdural hemorrhage on left posterior fossa.  Currently her main symptom is headache on the posterior side of her head.  Otherwise denies any other symptoms.  She is not sure or aware of the events that transpired.  She denies any blurring of vision chest pain, nausea, vomiting, fever, chills, shortness of breath.  She was started on Cardene  drip in the emergency room.  She had not taken her home blood pressure medications.   She was on Cardene  drip during my evaluation.  However subsequently was taken off Cardene  drip.   Interim History / Subjective:  See above.   Significant Hospital Events: 8/24: SDH and SAH.   Objective    Blood pressure 122/66, pulse 71, temperature 98 F (36.7 C), temperature source Oral, resp. rate 17, height 5' 8 (1.727 m), weight 93 kg, last menstrual period 07/22/1968, SpO2 98%.        Intake/Output Summary (Last 24 hours) at 03/14/2024 1255 Last data filed at 03/14/2024 1209 Gross per 24 hour  Intake 89.93 ml  Output --  Net 89.93 ml   Filed Weights   03/14/24 0906  Weight: 93 kg    Examination: General: Elderly lady appears stated age.  Has a bruise under her right eye. Lungs: clear to auscultation bilaterally.  Heart: regular rate rhythm, no murmur appreciated.  Abdomen: non tender, non distended. Normal BS.  Neuro: axox3.  Moving all 4 extremities.   Assessment and Plan  89 year old female with past medical history  of A-fib on Eliquis , hypertension, OSA on CPAP and bilateral TKR with recent right TKR who was found down in her walk-in closet.  On awakening she had a headache.  She was brought into the hospital by EMS.  CT head showed small subdural hematoma [4 mm] on the left para falacine area and a 2 cm area concerning for subdural hemorrhage on left posterior fossa.  Small traumatic SDH: Suspected traumatic SAH: A-fib on Eliquis : - Currently hemorrhage small.  Okay to not reverse. - Consider getting CT head in 6 hours from first CT (not ordered).  If CT appears stable okay to get no further imaging if neurologically she remains intact.  If there is worsening of bleed on the CT head in 6 hours then can consider reversing Eliquis . - SBP goal less than 140. - Neurosurgery following. Not a surgical candidate.   Hypertension: - Resume home meds.  Off Cardene  drip.  OSA: - Uses CPAP all night.  Started CPAP at pressure of 8.   Critical care will sign off.    Labs   CBC: Recent Labs  Lab 03/14/24 0855  WBC 9.6  HGB 13.5  HCT 41.5  MCV 92.6  PLT 267    Basic Metabolic Panel: No results for input(s): NA, K, CL, CO2, GLUCOSE, BUN, CREATININE, CALCIUM , MG, PHOS in the last 168 hours. GFR: CrCl cannot be calculated (Patient's most recent lab result is older than  the maximum 21 days allowed.). Recent Labs  Lab 03/14/24 0855  WBC 9.6    Liver Function Tests: No results for input(s): AST, ALT, ALKPHOS, BILITOT, PROT, ALBUMIN in the last 168 hours. No results for input(s): LIPASE, AMYLASE in the last 168 hours. No results for input(s): AMMONIA in the last 168 hours.  ABG    Component Value Date/Time   TCO2 25 12/01/2022 2040      Past Medical History:  She,  has a past medical history of Ankle fracture (2016), Arthritis, Atrial fibrillation (HCC), Depression, Dysrhythmia, Elevated parathyroid hormone, Fracture of orbital floor with routine healing,  GERD (gastroesophageal reflux disease), History of colon polyps, Hypercalcemia, Hypertension, Hyponatremia, Hypothyroid, Macular degeneration, OSA on CPAP, Osteoporosis, Peripheral vascular disease (HCC), Presence of permanent cardiac pacemaker, PSVT (paroxysmal supraventricular tachycardia) (HCC), Rotator cuff tear, Sinus node dysfunction (HCC), Stroke (HCC), Urinary incontinence, and Wrist fracture (2018).   Surgical History:   Past Surgical History:  Procedure Laterality Date   BACK SURGERY     CARDIOVERSION N/A 05/02/2022   Procedure: CARDIOVERSION;  Surgeon: Mona Vinie BROCKS, MD;  Location: [facility] ENDOSCOPY;  Service: Cardiovascular;  Laterality: N/A;   CATARACT EXTRACTION     CATARACT EXTRACTION W/ INTRAOCULAR LENS  IMPLANT, BILATERAL Bilateral    COLONOSCOPY     ESOPHAGOGASTRODUODENOSCOPY     IR ANGIO INTRA EXTRACRAN SEL COM CAROTID INNOMINATE BILAT MOD SED  12/30/2017   IR ANGIO VERTEBRAL SEL VERTEBRAL BILAT MOD SED  12/30/2017   LAPAROSCOPIC CHOLECYSTECTOMY  08/11/1999   LUMBAR DISC SURGERY  02/19/2014   ORIF ANKLE FRACTURE Left 04/13/2015   Procedure: OPEN REDUCTION INTERNAL FIXATION (ORIF) LEFT ANKLE FRACTURE;  Surgeon: Glendia Cordella Hutchinson, MD;  Location: [facility] OR;  Service: Orthopedics;  Laterality: Left;   PACEMAKER PLACEMENT Right 07/22/2010   a. MDT dual chamber PPM implanted by Dr Fernande    [facility] GENERATOR CHANGEOUT N/A 08/22/2022   Procedure: PPM GENERATOR HERMA;  Surgeon: Fernande Elspeth BROCKS, MD;  Location: [facility] INVASIVE CV LAB;  Service: Cardiovascular;  Laterality: N/A;   SHOULDER ARTHROSCOPY W/ ROTATOR CUFF REPAIR Right 08/30/2014   WITH MINI-OPEN ROTATOR CUFF REPAIR AND SUBACROMIAL DECOMPRESSION   SHOULDER ARTHROSCOPY WITH ROTATOR CUFF REPAIR AND SUBACROMIAL DECOMPRESSION Right 08/30/2014   Procedure: SHOULDER ARTHROSCOPY WITH MINI-OPEN ROTATOR CUFF REPAIR AND SUBACROMIAL DECOMPRESSION;  Surgeon: Cordella Glendia Hutchinson, MD;  Location: [facility] OR;  Service: Orthopedics;  Laterality: Right;   RIGHT SHOULDER DIAGNOSTIC OPERATIVE ARTHROSCOPY, SUBACROMIAL DECOMPRESSION, MINI-OPEN ROTATOR CUFF REPAIR.   TOTAL HIP ARTHROPLASTY Right 08/14/2021   Procedure: TOTAL HIP ARTHROPLASTY ANTERIOR APPROACH;  Surgeon: Sheril Coy, MD;  Location: [facility] ORS;  Service: Orthopedics;  Laterality: Right;   TOTAL KNEE ARTHROPLASTY Left 02/13/2021   Procedure: LEFT TOTAL KNEE ARTHROPLASTY;  Surgeon: Sheril Coy, MD;  Location: [facility] ORS;  Service: Orthopedics;  Laterality: Left;   TOTAL KNEE ARTHROPLASTY Right 11/11/2023   Procedure: ARTHROPLASTY, KNEE, TOTAL;  Surgeon: Sheril Coy, MD;  Location: [facility] ORS;  Service: Orthopedics;  Laterality: Right;  RIGHT KNEE ARTHROPLASTY   VAGINAL HYSTERECTOMY  07/22/1968   WRIST FRACTURE SURGERY Left      Social History:   reports that she quit smoking about 33 years ago. Her smoking use included cigarettes. She started smoking about 78 years ago. She has a 45 pack-year smoking history. She has never used smokeless tobacco. She reports current alcohol  use of about 28.0 standard drinks of alcohol  per week. She reports that she does not use drugs.   Family History:  Her family history  includes Asthma in her brother; Breast cancer in her paternal aunt; Cancer in her father; Colon cancer in her mother; Heart disease in her brother; Skin cancer in her brother; Stroke in her sister.   Allergies Allergies  Allergen Reactions   Citalopram  Hydrobromide Other (See Comments)    QT prolongation with Tikosyn    Adhesive [Tape] Swelling and Rash   Bacitracin-Polymyxin B Rash   Benzalkonium Chloride Rash    Pt was not aware of this allergy   Neosporin [Neomycin-Polymyxin-Gramicidin] Swelling and Rash     Home Medications  Prior to Admission medications   Medication Sig Start Date End Date Taking? Authorizing Provider  acetaminophen  (TYLENOL ) 500 MG tablet Take 500 mg by mouth 2 (two) times daily.   Yes [provider]  alendronate  (FOSAMAX ) 70 MG tablet Take 1  tablet (70 mg total) by mouth every 7 (seven) days. Take with a full glass of water  on an empty stomach. Patient taking differently: Take 70 mg by mouth every Wednesday. Take with a full glass of water  on an empty stomach. 03/04/17  Yes Kuneff, Renee A, DO  apixaban  (ELIQUIS ) 5 MG TABS tablet Take 1 tablet (5 mg total) by mouth 2 (two) times daily. 04/11/23  Yes Fernande Elspeth BROCKS, MD  atorvastatin  (LIPITOR) 40 MG tablet Take 40 mg by mouth in the morning.   Yes [provider]  B Complex-C (B-COMPLEX WITH VITAMIN C) tablet Take 1 tablet by mouth in the morning.   Yes [provider]  Cholecalciferol  (VITAMIN D3) 125 MCG (5000 UT) TABS Take 5,000 Units by mouth daily with breakfast.   Yes [provider]  dapagliflozin  propanediol (FARXIGA ) 10 MG TABS tablet Take 1 tablet (10 mg total) by mouth daily before breakfast. 04/21/23  Yes Fernande Elspeth BROCKS, MD  digoxin  (LANOXIN ) 0.125 MG tablet Take 1 tablet (0.125 mg total) by mouth daily. 05/13/23  Yes Fernande Elspeth BROCKS, MD  furosemide  (LASIX ) 40 MG tablet Take 2 tablets (80 mg total) by mouth every other day. TAKE 80 MG ALTERNATING 40 MG EVERY OTHER DAY Patient taking differently: Take 40-80 mg by mouth every other day. TAKE 80 MG ALTERNATING 40 MG EVERY OTHER DAY 06/26/23  Yes Leverne Charlies Helling, PA-C  levothyroxine  (SYNTHROID ) 88 MCG tablet Take 88 mcg by mouth daily before breakfast.   Yes [provider]  Lutein  20 MG TABS Take 20 mg by mouth daily at 12 noon.   Yes [provider]  magnesium  oxide (MAG-OX) 400 MG tablet Take 1 tablet (400 mg total) by mouth daily. 03/21/14  Yes Fernande Elspeth BROCKS, MD  Melatonin 10 MG TABS Take 10-40 mg by mouth at bedtime as needed (sleep).   Yes [provider]  metoprolol  succinate (TOPROL -XL) 50 MG 24 hr tablet 1 tablet Orally twice a day 11/17/23  Yes [provider]  Multiple Vitamin (MULTIVITAMIN WITH MINERALS) TABS tablet Take 1 tablet by mouth daily at 12 noon.    Yes [provider]  Multiple Vitamins-Minerals (PRESERVISION AREDS 2 PO) Take 1 capsule by mouth 2 (two) times daily with a meal.    Yes [provider]  sertraline  (ZOLOFT ) 50 MG tablet Take 1 tablet (50 mg total) by mouth daily. 03/22/19 03/14/24 Yes Dow Arland BROCKS, NP  spironolactone  (ALDACTONE ) 25 MG tablet Take 1 tablet (25 mg total) by mouth daily. Patient taking differently: Take 25 mg by mouth daily at 12 noon. 10/14/23  Yes Zenaida Morene PARAS, MD  HYDROcodone -acetaminophen  (NORCO/VICODIN) 5-325 MG tablet Take  1 tablet by mouth every 6 (six) hours as needed for moderate pain (pain score 4-6) or severe pain (pain score 7-10) (post op pain). Patient not taking: Reported on 03/14/2024 11/11/23 11/10/24  Lenis Barter, PA-C     CRITICAL CARE Performed by: Sammi JONETTA Fredericks.     Total critical care time: 34 minutes   Critical care time was exclusive of separately billable procedures and treating other patients.   Critical care was necessary to treat or prevent imminent or life-threatening deterioration.   Critical care was time spent personally by me on the following activities: development of treatment plan with patient and/or surrogate as well as nursing, discussions with consultants, evaluation of patient's response to treatment, examination of patient, obtaining history from patient or surrogate, ordering and performing treatments and interventions, ordering and review of laboratory studies, ordering and review of radiographic studies, pulse oximetry, re-evaluation of patient's condition and participation in multidisciplinary rounds.  Sammi JONETTA Fredericks, MD Pulmonary, Critical Care and Sleep Attending.  Pager: [phone]  03/14/2024, 12:55 PM

## 2024-03-14 NOTE — ED Notes (Signed)
 Trauma Response Nurse Documentation   Brittany [name] is a 89 y.o. female arriving to [facility] ED via EMS  On Eliquis  (apixaban ) daily. Trauma was activated as a Level 2 by ED Charge RN based on the following trauma criteria Elderly patients > 65 with head trauma on anti-coagulation (excluding ASA).  Patient cleared for CT by Dr. Neysa. Pt transported to CT with trauma response nurse present to monitor. RN remained with the patient throughout their absence from the department for clinical observation.   GCS 14.  History   Past Medical History:  Diagnosis Date   Ankle fracture 2016   Arthritis    Atrial fibrillation (HCC)    Depression    Dysrhythmia    Elevated parathyroid hormone    Fracture of orbital floor with routine healing    GERD (gastroesophageal reflux disease)    History of colon polyps    benign   Hypercalcemia    Hypertension    Hyponatremia    Hypothyroid    Macular degeneration    wet in the right and dry in the left   OSA on CPAP    Osteoporosis    Peripheral vascular disease (HCC)    Presence of permanent cardiac pacemaker    PSVT (paroxysmal supraventricular tachycardia) (HCC)    Rotator cuff tear    Sinus node dysfunction (HCC)    a. s/p MDT pacemaker   Stroke [facility])    Urinary incontinence    Wrist fracture 2018     Past Surgical History:  Procedure Laterality Date   BACK SURGERY     CARDIOVERSION N/A 05/02/2022   Procedure: CARDIOVERSION;  Surgeon: Mona Vinie BROCKS, MD;  Location: [facility] ENDOSCOPY;  Service: Cardiovascular;  Laterality: N/A;   CATARACT EXTRACTION     CATARACT EXTRACTION W/ INTRAOCULAR LENS  IMPLANT, BILATERAL Bilateral    COLONOSCOPY     ESOPHAGOGASTRODUODENOSCOPY     IR ANGIO INTRA EXTRACRAN SEL COM CAROTID INNOMINATE BILAT MOD SED  12/30/2017   IR ANGIO VERTEBRAL SEL VERTEBRAL BILAT MOD SED  12/30/2017   LAPAROSCOPIC CHOLECYSTECTOMY  08/11/1999   LUMBAR DISC SURGERY  02/19/2014   ORIF ANKLE FRACTURE Left 04/13/2015    Procedure: OPEN REDUCTION INTERNAL FIXATION (ORIF) LEFT ANKLE FRACTURE;  Surgeon: Glendia Cordella Hutchinson, MD;  Location: [facility] OR;  Service: Orthopedics;  Laterality: Left;   PACEMAKER PLACEMENT Right 07/22/2010   a. MDT dual chamber PPM implanted by Dr Fernande    [facility] GENERATOR CHANGEOUT N/A 08/22/2022   Procedure: PPM GENERATOR HERMA;  Surgeon: Fernande Elspeth BROCKS, MD;  Location: [facility] INVASIVE CV LAB;  Service: Cardiovascular;  Laterality: N/A;   SHOULDER ARTHROSCOPY W/ ROTATOR CUFF REPAIR Right 08/30/2014   WITH MINI-OPEN ROTATOR CUFF REPAIR AND SUBACROMIAL DECOMPRESSION   SHOULDER ARTHROSCOPY WITH ROTATOR CUFF REPAIR AND SUBACROMIAL DECOMPRESSION Right 08/30/2014   Procedure: SHOULDER ARTHROSCOPY WITH MINI-OPEN ROTATOR CUFF REPAIR AND SUBACROMIAL DECOMPRESSION;  Surgeon: Cordella Glendia Hutchinson, MD;  Location: [facility] OR;  Service: Orthopedics;  Laterality: Right;  RIGHT SHOULDER DIAGNOSTIC OPERATIVE ARTHROSCOPY, SUBACROMIAL DECOMPRESSION, MINI-OPEN ROTATOR CUFF REPAIR.   TOTAL HIP ARTHROPLASTY Right 08/14/2021   Procedure: TOTAL HIP ARTHROPLASTY ANTERIOR APPROACH;  Surgeon: Sheril Coy, MD;  Location: [facility] ORS;  Service: Orthopedics;  Laterality: Right;   TOTAL KNEE ARTHROPLASTY Left 02/13/2021   Procedure: LEFT TOTAL KNEE ARTHROPLASTY;  Surgeon: Sheril Coy, MD;  Location: [facility] ORS;  Service: Orthopedics;  Laterality: Left;   TOTAL KNEE ARTHROPLASTY Right 11/11/2023   Procedure: ARTHROPLASTY, KNEE, TOTAL;  Surgeon: Sheril Coy,  MD;  Location: [facility] ORS;  Service: Orthopedics;  Laterality: Right;  RIGHT KNEE ARTHROPLASTY   VAGINAL HYSTERECTOMY  07/22/1968   WRIST FRACTURE SURGERY Left      Initial Focused Assessment (If applicable, or please see trauma documentation): Airway: Intact, patent Breathing: Breath sounds clear, equal bilaterally. No SOB or CP.  Circulation: Bruising noted below R eye. Pulses intact throughout. 18G PIV. Disability: MAE purposefully and equally.  Pt mildly confused but conversing  normally.  PERRLA. EMS field c-collar in place.   CT's Completed:   CT Head and CT C-Spine   Interventions:  CXR Pelvic XR CT head and c-spine Labs drawn Cardene  gtt ordered  Zofran  ordered and given Fentanyl  ordered and given  Plan for disposition:  Other Awaiting plan from neurosurgery.  Consults completed:  Neurosurgeon Duwaine Beck and Dr Mavis consulted at [phone]. Responded at 0959.  Event Summary: Pt BIB [facility] after sustaining a presumed fall.  Pt states that she does not remember the chain of events however she does know that she woke up in her closet with a bad headache and a black eye.   Bedside handoff with ED RN Chloe.    LEBRON ROCKIE ORN  Trauma Response RN  Please call TRN at [phone] for further assistance.

## 2024-03-14 NOTE — ED Triage Notes (Signed)
 Pt BIB [facility] from a [facility] facility named [facility]. Pt fell in her walk in closet, hit her on head on the carpet. Pt does not remember what happened. Pt is HTN in the 210. Pt has a pacemaker. A&O X4. GCS 133.

## 2024-03-14 NOTE — ED Provider Notes (Signed)
 [place] EMERGENCY DEPARTMENT AT [facility] Provider Note   CSN: [id number] Arrival date & time: 03/14/24  9155     Patient presents with: No chief complaint on file.   Brittany [name] is a 89 y.o. female.   This is an 89 year old female presenting emergency department as a trauma activation after fall.  Unwitnessed, but was found in a closet.  At baseline per EMS report, but cannot recall events surrounding her fall.  Complains of head pain, neck pain.  No chest pain shortness of breath or abdominal pain.  No pain to extremities.        Prior to Admission medications   Medication Sig Start Date End Date Taking? Authorizing Provider  acetaminophen  (TYLENOL ) 500 MG tablet Take 500 mg by mouth 2 (two) times daily.   Yes [provider]  alendronate  (FOSAMAX ) 70 MG tablet Take 1 tablet (70 mg total) by mouth every 7 (seven) days. Take with a full glass of water  on an empty stomach. Patient taking differently: Take 70 mg by mouth every Wednesday. Take with a full glass of water  on an empty stomach. 03/04/17  Yes Kuneff, Renee A, DO  apixaban  (ELIQUIS ) 5 MG TABS tablet Take 1 tablet (5 mg total) by mouth 2 (two) times daily. 04/11/23  Yes Fernande Elspeth BROCKS, MD  atorvastatin  (LIPITOR) 40 MG tablet Take 40 mg by mouth in the morning.   Yes [provider]  B Complex-C (B-COMPLEX WITH VITAMIN C) tablet Take 1 tablet by mouth in the morning.   Yes [provider]  Cholecalciferol  (VITAMIN D3) 125 MCG (5000 UT) TABS Take 5,000 Units by mouth daily with breakfast.   Yes [provider]  dapagliflozin  propanediol (FARXIGA ) 10 MG TABS tablet Take 1 tablet (10 mg total) by mouth daily before breakfast. 04/21/23  Yes Fernande Elspeth BROCKS, MD  digoxin  (LANOXIN ) 0.125 MG tablet Take 1 tablet (0.125 mg total) by mouth daily. 05/13/23  Yes Fernande Elspeth BROCKS, MD  furosemide  (LASIX ) 40 MG tablet Take 2 tablets (80 mg total) by mouth every other day. TAKE 80 MG  ALTERNATING 40 MG EVERY OTHER DAY Patient taking differently: Take 40-80 mg by mouth every other day. TAKE 80 MG ALTERNATING 40 MG EVERY OTHER DAY 06/26/23  Yes Leverne Charlies Helling, PA-C  levothyroxine  (SYNTHROID ) 88 MCG tablet Take 88 mcg by mouth daily before breakfast.   Yes [provider]  Lutein  20 MG TABS Take 20 mg by mouth daily at 12 noon.   Yes [provider]  magnesium  oxide (MAG-OX) 400 MG tablet Take 1 tablet (400 mg total) by mouth daily. 03/21/14  Yes Fernande Elspeth BROCKS, MD  Melatonin 10 MG TABS Take 10-40 mg by mouth at bedtime as needed (sleep).   Yes [provider]  metoprolol  succinate (TOPROL -XL) 50 MG 24 hr tablet 1 tablet Orally twice a day 11/17/23  Yes [provider]  Multiple Vitamin (MULTIVITAMIN WITH MINERALS) TABS tablet Take 1 tablet by mouth daily at 12 noon.   Yes [provider]  Multiple Vitamins-Minerals (PRESERVISION AREDS 2 PO) Take 1 capsule by mouth 2 (two) times daily with a meal.    Yes [provider]  sertraline  (ZOLOFT ) 50 MG tablet Take 1 tablet (50 mg total) by mouth daily. 03/22/19 03/14/24 Yes Dow Arland BROCKS, NP  spironolactone  (ALDACTONE ) 25 MG tablet Take 1 tablet (25 mg total) by mouth daily. Patient taking differently: Take 25 mg by mouth daily at 12 noon. 10/14/23  Yes Zenaida Morene PARAS, MD  HYDROcodone -acetaminophen  (NORCO/VICODIN) 5-325 MG tablet Take 1 tablet by mouth every 6 (six) hours as needed for moderate pain (pain score 4-6) or severe pain (pain score 7-10) (post op pain). Patient not taking: Reported on 03/14/2024 11/11/23 11/10/24  Nida, Andrew, PA-C    Allergies: Citalopram  hydrobromide, Adhesive [tape], Bacitracin-polymyxin b, Benzalkonium chloride, and Neosporin [neomycin-polymyxin-gramicidin]    Review of Systems  Updated Vital Signs BP (!) 149/57   Pulse 75   Temp 98.2 F (36.8 C) (Oral)   Resp 20   Ht 5' 8 (1.727 m)   Wt 93 kg   LMP 07/22/1968   SpO2 99%   BMI 31.17  kg/m   Physical Exam Vitals and nursing note reviewed.  Constitutional:      General: She is not in acute distress.    Appearance: She is not toxic-appearing.  HENT:     Head: Normocephalic.     Comments: Some bruising to her right cheek    Nose: Nose normal.     Mouth/Throat:     Mouth: Mucous membranes are moist.  Eyes:     Extraocular Movements: Extraocular movements intact.     Conjunctiva/sclera: Conjunctivae normal.     Pupils: Pupils are equal, round, and reactive to light.  Neck:     Comments: C-collar present Cardiovascular:     Rate and Rhythm: Normal rate.     Pulses: Normal pulses.  Pulmonary:     Effort: Pulmonary effort is normal.     Breath sounds: Normal breath sounds.  Abdominal:     General: Abdomen is flat. There is no distension.     Palpations: Abdomen is soft.     Tenderness: There is no abdominal tenderness. There is no guarding or rebound.  Musculoskeletal:        General: No tenderness.     Right lower leg: No edema.     Left lower leg: No edema.  Skin:    General: Skin is warm and dry.     Capillary Refill: Capillary refill takes less than 2 seconds.  Neurological:     General: No focal deficit present.     Mental Status: She is alert and oriented to person, place, and time.  Psychiatric:        Mood and Affect: Mood normal.        Behavior: Behavior normal.     (all labs ordered are listed, but only abnormal results are displayed) Labs Reviewed  CBC - Abnormal; Notable for the following components:      Result Value   RDW 15.9 (*)    All other components within normal limits  COMPREHENSIVE METABOLIC PANEL WITH GFR - Abnormal; Notable for the following components:   Sodium 130 (*)    Potassium 5.4 (*)    Chloride 97 (*)    Glucose, Bld 130 (*)    All other components within normal limits  PROTIME-INR - Abnormal; Notable for the following components:   Prothrombin Time 16.0 (*)    All other components within normal limits  HEPARIN   LEVEL (UNFRACTIONATED) - Abnormal; Notable for the following components:   Heparin  Unfractionated >1.10 (*)    All other components within normal limits  CBG MONITORING, ED - Abnormal; Notable for the following components:   Glucose-Capillary 133 (*)    All other components within normal limits  TROPONIN I (HIGH SENSITIVITY) - Abnormal; Notable for the following components:   Troponin I (High Sensitivity) 35 (*)  All other components within normal limits  TROPONIN I (HIGH SENSITIVITY) - Abnormal; Notable for the following components:   Troponin I (High Sensitivity) 31 (*)    All other components within normal limits  APTT    EKG: None  Radiology: CT Head Wo Contrast Addendum Date: 03/14/2024 ADDENDUM REPORT: 03/14/2024 09:53 ADDENDUM: Study discussed by telephone with Dr. CARON SALT on 03/14/2024 at 0937 hours. And he advises the patient is on Eliquis . I suspect acute subarachnoid hemorrhage in the left lower posterior fossa. Electronically Signed   By: VEAR Hurst M.D.   On: 03/14/2024 09:53   Result Date: 03/14/2024 CLINICAL DATA:  89 year old female status post fall in closet. Struck head. Hypertensive, 210 systolic. EXAM: CT HEAD WITHOUT CONTRAST TECHNIQUE: Contiguous axial images were obtained from the base of the skull through the vertex without intravenous contrast. RADIATION DOSE REDUCTION: This exam was performed according to the departmental dose-optimization program which includes automated exposure control, adjustment of the mA and/or kV according to patient size and/or use of iterative reconstruction technique. COMPARISON:  Head CT 02/04/2023. FINDINGS: Brain: New since last year hyperdense extra-axial space-occupying mass at the left ventral posterior fossa, series 3, image 8). This involves the left pre medullary cistern. This involves an area of about 2 cm (sagittal image 37). Not visible last year. Additionally, no regional mass effect. And superimposed supratentorial hyperdense  para falcine subdural hematoma, up to 3 mm in thickness. No IVH or ventriculomegaly. Stable cerebral volume since last year. Advanced chronic cerebral white matter disease. Chronic heterogeneity in the deep gray nuclei compatible with small vessel disease. No cortically based acute infarct identified. Vascular: Calcified atherosclerosis at the skull base. No suspicious intracranial vascular hyperdensity. Skull: Stable and intact. Sinuses/Orbits: Resolved paranasal sinus mucosal thickening last year. Visualized paranasal sinuses and mastoids are well aerated. Other: Chronic right forehead scalp soft tissue scarring. No acute orbit or scalp soft tissue injury identified. Traumatic Brain Injury Risk Stratification Skull Fracture: No - Low/mBIG 1 Subdural Hematoma (SDH): <83mm - mBIG 1 Subarachnoid Hemorrhage [facility]): Probable left posterior fossa subarachnoid hemorrhage. Epidural Hematoma (EDH): No - Low/mBIG 1 Cerebral contusion, intra-axial, intraparenchymal Hemorrhage (IPH): No Intraventricular Hemorrhage (IVH): No - Low/mBIG 1 Midline Shift > 1mm or Edema/effacement of sulci/vents: No - Low/mBIG 1 ---------------------------------------------------- IMPRESSION: 1. Less than 4 mm para falcine subdural hematoma. And additionally, 2 cm area of hyperdense hemorrhage versus mass in the left lower posterior fossa, left pre medullary cistern. 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 2. No intracranial mass effect or midline shift. 3. Stable non contrast CT appearance of chronic small vessel disease. Electronically Signed: By: VEAR Hurst M.D. On: 03/14/2024 09:33   DG Chest Portable 1 View Result Date: 03/14/2024 CLINICAL DATA:  89 year old female status post fall in closet. Struck head. Hypertensive, 210 systolic. On Eliquis . EXAM: PORTABLE CHEST 1 VIEW COMPARISON:  Portable chest 11/20/2023 and earlier. FINDINGS: Portable AP semi  upright view at 0854 hours. Chronic cardiomegaly, mild elevated left hemidiaphragm, left chest cardiac pacemaker. Stable lung volumes, mediastinal contours. Visualized tracheal air column is within normal limits. Allowing for portable technique the lungs are clear. No pneumothorax or pleural effusion. Advanced bilateral shoulder degeneration. No acute osseous abnormality identified. Paucity of bowel gas. IMPRESSION: 1. No acute cardiopulmonary abnormality or acute traumatic injury identified. 2. Chronic cardiomegaly, pacemaker. Electronically Signed   By: VEAR Hurst M.D.   On: 03/14/2024 09:52   DG Pelvis 1-2  Views Result Date: 03/14/2024 CLINICAL DATA:  89 year old female status post fall in closet. Struck head. Hypertensive, 210 systolic. EXAM: PELVIS - 1-2 VIEW COMPARISON:  CT abdomen 11/25/2023. Intraoperative right hip images 12423. FINDINGS: Portable AP supine view at 0856 hours. Bipolar type right hip arthroplasty partially visible, grossly stable. Bone mineralization is within normal limits for age. Left femoral head degenerated, normally located. No acute pelvis fracture identified. Grossly intact proximal femurs. Nonobstructed bowel-gas pattern. Partially visible advanced lumbar disc and endplate degeneration. IMPRESSION: No acute fracture or dislocation identified about the pelvis. Electronically Signed   By: VEAR Hurst M.D.   On: 03/14/2024 09:41   CT Cervical Spine Wo Contrast Result Date: 03/14/2024 CLINICAL DATA:  89 year old female status post fall in closet. Struck head. Hypertensive, 210 systolic. EXAM: CT CERVICAL SPINE WITHOUT CONTRAST TECHNIQUE: Multidetector CT imaging of the cervical spine was performed without intravenous contrast. Multiplanar CT image reconstructions were also generated. RADIATION DOSE REDUCTION: This exam was performed according to the departmental dose-optimization program which includes automated exposure control, adjustment of the mA and/or kV according to patient size  and/or use of iterative reconstruction technique. COMPARISON:  Head CT today.  Cervical spine CT 12/01/2022. FINDINGS: Alignment: Stable straightening of cervical lordosis. Chronic cirrhosis or assay ankylosis. Bilateral posterior element alignment is within normal limits. Skull base and vertebrae: Visualized skull base is intact. No atlanto-occipital dissociation. C1 and C2 are chronically degenerated but appear intact and aligned. Diffuse idiopathic skeletal hyperostosis (DISH). No acute osseous abnormality identified. Soft tissues and spinal canal: No prevertebral fluid or swelling. No visible canal hematoma. However, globular hyperdense hemorrhage again suspected in the left posterior fossa just above the cisterna magna (series 6, image 19). This is contiguous with the foramen of Luschka. Severe calcified cervical carotid atherosclerosis. Disc levels: Advanced cervical spine degeneration with Diffuse idiopathic skeletal hyperostosis (DISH). Chronic cervicothoracic junction ankylosis. Bulky endplate osteophytes elsewhere. Appearance is stable from the CT last year. Upper chest: Visible upper thoracic levels appear intact. Chronic left chest pacemaker leads. Negative lung apices. IMPRESSION: 1. No acute traumatic injury identified in the cervical spine. 2. Suspected acute subarachnoid space hemorrhage redemonstrated just above the left cisterna magna, somewhat contiguous with the foramen of Luschka. See Head CT reported separately. 3. Advanced chronic cervical spine degeneration with Diffuse idiopathic skeletal hyperostosis (DISH) appears stable from last year. Electronically Signed   By: VEAR Hurst M.D.   On: 03/14/2024 09:36     .Critical Care  Performed by: Neysa Caron PARAS, DO Authorized by: Neysa Caron PARAS, DO   Critical care provider statement:    Critical care time (minutes):  75   Critical care was necessary to treat or prevent imminent or life-threatening deterioration of the following conditions:   Trauma and CNS failure or compromise   Critical care was time spent personally by me on the following activities:  Development of treatment plan with patient or surrogate, discussions with consultants, evaluation of patient's response to treatment, examination of patient, ordering and review of laboratory studies, ordering and review of radiographic studies, ordering and performing treatments and interventions, pulse oximetry, re-evaluation of patient's condition and review of old charts    Medications Ordered in the ED  metoprolol  succinate (TOPROL -XL) 24 hr tablet 50 mg (50 mg Oral Given 03/14/24 1302)  lidocaine  (LIDODERM ) 5 % 1 patch (1 patch Transdermal Patch Applied 03/14/24 1302)  ondansetron  (ZOFRAN ) injection 4 mg (4 mg Intravenous Given 03/14/24 0945)  hydrALAZINE  (APRESOLINE ) injection 10 mg (10 mg Intravenous Given 03/14/24  0945)  fentaNYL  (SUBLIMAZE ) injection 25 mcg (25 mcg Intravenous Given 03/14/24 0949)  ondansetron  (ZOFRAN ) injection 4 mg (4 mg Intravenous Given 03/14/24 1011)  furosemide  (LASIX ) tablet 40 mg (40 mg Oral Given 03/14/24 1302)    Clinical Course as of 03/14/24 1444  Sun Mar 14, 2024  0936 SAH fossa; no skull. Parafalcine subdural 2 mm.  No cervical fracture.  [TY]  [id number] Neurosurgery consulted.  Patient blood pressure is elevated.  Started vomiting.  But remains neuro intact with no focal deficits.  Hydralazine  and nicardipine  ordered for blood pressure control.  As well as Zofran . [TY]  0950 CT Head Wo Contrast IMPRESSION: 1. Less than 4 mm para falcine subdural hematoma. And additionally, 2 cm area of hyperdense hemorrhage versus mass in the left lower posterior fossa, left pre medullary cistern. 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 2. No intracranial mass effect or midline shift. 3. Stable non contrast CT appearance of chronic small  vessel disease.   Electronically Signed   By: VEAR Hurst M.D.   On: 03/14/2024 09:33   [TY]  9048 Had goals of care discussion with patient as she does have advanced directive paperwork bedside and confirmed in no uncertain terms that if she were to decompensate she would not want intubation or CPR.  [TY]  [id number] Spoke with neurosurgery; agree with blood pressure control, but no acute intervention planned will see patient in consult [TY]  1041 Spoke with ICU who will come down to see patient for admission [TY]  1235 After evaluation from ICU was able to stop nicardipine .  Requesting medicine admission as does not need ICU level of care. [TY]  [id number] Spoke with hospitalist for admission. [TY]    Clinical Course User Index [TY] Neysa Caron PARAS, DO                                 Medical Decision Making Is an 89 year old female presenting emergency department after being found on the ground in her closet with presumed fall.  EMS reported stable vitals and no medications given prior to arrival.  She is afebrile here, nontachycardic, normotensive.  Physical exam largely reassuring with some minor abrasion to her head and face.  Per chart reviewed does take Eliquis .  Will get CT head, C-spine and x-rays of chest and pelvis.  Unclear if patient syncopized causing her fall versus mechanical.  Will get screening syncope labs.  Does have history of A-fib, as well as pacemaker.  Will interrogate pacemaker.  See ED course for further MDM and disposition.  Amount and/or Complexity of Data Reviewed Labs: ordered. Radiology: ordered. Decision-making details documented in ED Course. ECG/medicine tests: ordered.  Risk Prescription drug management. Decision regarding hospitalization.       Final diagnoses:  Subarachnoid hemorrhage [facility])    ED Discharge Orders     None          Neysa Caron PARAS, DO 03/14/24 1444

## 2024-03-14 NOTE — Consult Note (Signed)
 Providing Compassionate, Quality Care - Together   Reason for Consult: Small interhemispheric hemorrhage Referring Physician: Dr. Neysa Niels [name] is an 89 y.o. female.  HPI: Patient is an 89 year old female with history outlined below. She is on Eliquis  for atrial fibrillation. She suffered a ground level fall and was found this morning in her closet at her independent living facility, [facility]. Her husband is at the bedside and assists with patient history. Patient's husband feels patient had a syncopal event, striking her head, while getting clothing from the closet this morning. The patient ambulates with a walker at baseline and reports she is unsteady on her feet. She reports pain at the back of her neck and head. She has had some nausea and vomiting since she struck her head. She does not remember the fall. CT imaging revealed a very small parafalcine subdural hematoma and a small area of hemorrhage in the left lower posterior fossa. Neurosurgery was consulted for further evaluation and recommendations.  Past Medical History:  Diagnosis Date   Ankle fracture 2016   Arthritis    Atrial fibrillation (HCC)    Depression    Dysrhythmia    Elevated parathyroid hormone    Fracture of orbital floor with routine healing    GERD (gastroesophageal reflux disease)    History of colon polyps    benign   Hypercalcemia    Hypertension    Hyponatremia    Hypothyroid    Macular degeneration    wet in the right and dry in the left   OSA on CPAP    Osteoporosis    Peripheral vascular disease (HCC)    Presence of permanent cardiac pacemaker    PSVT (paroxysmal supraventricular tachycardia) (HCC)    Rotator cuff tear    Sinus node dysfunction (HCC)    a. s/p MDT pacemaker   Stroke [facility])    Urinary incontinence    Wrist fracture 2018    Past Surgical History:  Procedure Laterality Date   BACK SURGERY     CARDIOVERSION N/A 05/02/2022   Procedure: CARDIOVERSION;  Surgeon:  Mona Vinie BROCKS, MD;  Location: [facility];  Service: Cardiovascular;  Laterality: N/A;   CATARACT EXTRACTION     CATARACT EXTRACTION W/ INTRAOCULAR LENS  IMPLANT, BILATERAL Bilateral    COLONOSCOPY     ESOPHAGOGASTRODUODENOSCOPY     IR ANGIO INTRA EXTRACRAN SEL COM CAROTID INNOMINATE BILAT MOD SED  12/30/2017   IR ANGIO VERTEBRAL SEL VERTEBRAL BILAT MOD SED  12/30/2017   LAPAROSCOPIC CHOLECYSTECTOMY  08/11/1999   LUMBAR DISC SURGERY  02/19/2014   ORIF ANKLE FRACTURE Left 04/13/2015   Procedure: OPEN REDUCTION INTERNAL FIXATION (ORIF) LEFT ANKLE FRACTURE;  Surgeon: Glendia Cordella Hutchinson, MD;  Location: [facility] OR;  Service: Orthopedics;  Laterality: Left;   PACEMAKER PLACEMENT Right 07/22/2010   a. MDT dual chamber PPM implanted by Dr Fernande    [facility] GENERATOR CHANGEOUT N/A 08/22/2022   Procedure: PPM GENERATOR HERMA;  Surgeon: Fernande Elspeth BROCKS, MD;  Location: [facility] INVASIVE CV LAB;  Service: Cardiovascular;  Laterality: N/A;   SHOULDER ARTHROSCOPY W/ ROTATOR CUFF REPAIR Right 08/30/2014   WITH MINI-OPEN ROTATOR CUFF REPAIR AND SUBACROMIAL DECOMPRESSION   SHOULDER ARTHROSCOPY WITH ROTATOR CUFF REPAIR AND SUBACROMIAL DECOMPRESSION Right 08/30/2014   Procedure: SHOULDER ARTHROSCOPY WITH MINI-OPEN ROTATOR CUFF REPAIR AND SUBACROMIAL DECOMPRESSION;  Surgeon: Cordella Glendia Hutchinson, MD;  Location: [facility] OR;  Service: Orthopedics;  Laterality: Right;  RIGHT SHOULDER DIAGNOSTIC OPERATIVE ARTHROSCOPY, SUBACROMIAL DECOMPRESSION, MINI-OPEN ROTATOR  CUFF REPAIR.   TOTAL HIP ARTHROPLASTY Right 08/14/2021   Procedure: TOTAL HIP ARTHROPLASTY ANTERIOR APPROACH;  Surgeon: Sheril Coy, MD;  Location: [facility] ORS;  Service: Orthopedics;  Laterality: Right;   TOTAL KNEE ARTHROPLASTY Left 02/13/2021   Procedure: LEFT TOTAL KNEE ARTHROPLASTY;  Surgeon: Sheril Coy, MD;  Location: [facility] ORS;  Service: Orthopedics;  Laterality: Left;   TOTAL KNEE ARTHROPLASTY Right 11/11/2023   Procedure: ARTHROPLASTY, KNEE, TOTAL;  Surgeon: Sheril Coy, MD;  Location: [facility] OR[facility];  Service: Orthopedics;  Laterality: Right;  RIGHT KNEE ARTHROPLASTY   VAGINAL HYSTERECTOMY  07/22/1968   WRIST FRACTURE SURGERY Left     Family History  Problem Relation Age of Onset   Colon cancer Mother        colon   Cancer Father        unknown type   Stroke Sister    Skin cancer Brother    Heart disease Brother    Breast cancer Paternal Aunt    Asthma Brother     Social History:  reports that she quit smoking about 33 years ago. Her smoking use included cigarettes. She started smoking about 78 years ago. She has a 45 pack-year smoking history. She has never used smokeless tobacco. She reports current alcohol  use of about 28.0 standard drinks of alcohol  per week. She reports that she does not use drugs.  Allergies:  Allergies  Allergen Reactions   Citalopram  Hydrobromide Other (See Comments)    QT prolongation with Tikosyn    Adhesive [Tape] Swelling and Rash   Bacitracin-Polymyxin B Rash   Benzalkonium Chloride Rash    Pt was not aware of this allergy   Neosporin [Neomycin-Polymyxin-Gramicidin] Swelling and Rash    Medications: I have reviewed the patient's current medications.  Results for orders placed or performed during the hospital encounter of 03/14/24 (from the past 48 hours)  CBC     Status: Abnormal   Collection Time: 03/14/24  8:55 AM  Result Value Ref Range   WBC 9.6 4.0 - 10.5 K/uL   RBC 4.48 3.87 - 5.11 MIL/uL   Hemoglobin 13.5 12.0 - 15.0 g/dL   HCT 58.4 63.9 - 53.9 %   MCV 92.6 80.0 - 100.0 fL   MCH 30.1 26.0 - 34.0 pg   MCHC 32.5 30.0 - 36.0 g/dL   RDW 84.0 (H) 88.4 - 84.4 %   Platelets 267 150 - 400 K/uL   nRBC 0.0 0.0 - 0.2 %    Comment: Performed at [facility], [address] [place], [place] [address]  POC CBG, ED     Status: Abnormal   Collection Time: 03/14/24  9:02 AM  Result Value Ref Range   Glucose-Capillary 133 (H) 70 - 99 mg/dL    Comment: Glucose reference range applies only to samples taken  after fasting for at least 8 hours.    CT Head Wo Contrast Addendum Date: 03/14/2024 ADDENDUM REPORT: 03/14/2024 09:53 ADDENDUM: Study discussed by telephone with Dr. CARON SALT on 03/14/2024 at [phone] hours. And he advises the patient is on Eliquis . I suspect acute subarachnoid hemorrhage in the left lower posterior fossa. Electronically Signed   By: VEAR Hurst M.D.   On: 03/14/2024 09:53   Result Date: 03/14/2024 CLINICAL DATA:  89 year old female status post fall in closet. Struck head. Hypertensive, 210 systolic. EXAM: CT HEAD WITHOUT CONTRAST TECHNIQUE: Contiguous axial images were obtained from the base of the skull through the vertex without intravenous contrast. RADIATION DOSE REDUCTION: This exam was  performed according to the departmental dose-optimization program which includes automated exposure control, adjustment of the mA and/or kV according to patient size and/or use of iterative reconstruction technique. COMPARISON:  Head CT 02/04/2023. FINDINGS: Brain: New since last year hyperdense extra-axial space-occupying mass at the left ventral posterior fossa, series 3, image 8). This involves the left pre medullary cistern. This involves an area of about 2 cm (sagittal image 37). Not visible last year. Additionally, no regional mass effect. And superimposed supratentorial hyperdense para falcine subdural hematoma, up to 3 mm in thickness. No IVH or ventriculomegaly. Stable cerebral volume since last year. Advanced chronic cerebral white matter disease. Chronic heterogeneity in the deep gray nuclei compatible with small vessel disease. No cortically based acute infarct identified. Vascular: Calcified atherosclerosis at the skull base. No suspicious intracranial vascular hyperdensity. Skull: Stable and intact. Sinuses/Orbits: Resolved paranasal sinus mucosal thickening last year. Visualized paranasal sinuses and mastoids are well aerated. Other: Chronic right forehead scalp soft tissue scarring. No acute  orbit or scalp soft tissue injury identified. Traumatic Brain Injury Risk Stratification Skull Fracture: No - Low/mBIG 1 Subdural Hematoma (SDH): <34mm - mBIG 1 Subarachnoid Hemorrhage [facility]): Probable left posterior fossa subarachnoid hemorrhage. Epidural Hematoma (EDH): No - Low/mBIG 1 Cerebral contusion, intra-axial, intraparenchymal Hemorrhage (IPH): No Intraventricular Hemorrhage (IVH): No - Low/mBIG 1 Midline Shift > 1mm or Edema/effacement of sulci/vents: No - Low/mBIG 1 ---------------------------------------------------- IMPRESSION: 1. Less than 4 mm para falcine subdural hematoma. And additionally, 2 cm area of hyperdense hemorrhage versus mass in the left lower posterior fossa, left pre medullary cistern. 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 2. No intracranial mass effect or midline shift. 3. Stable non contrast CT appearance of chronic small vessel disease. Electronically Signed: By: VEAR Hurst M.D. On: 03/14/2024 09:33   DG Chest Portable 1 View Result Date: 03/14/2024 CLINICAL DATA:  89 year old female status post fall in closet. Struck head. Hypertensive, 210 systolic. On Eliquis . EXAM: PORTABLE CHEST 1 VIEW COMPARISON:  Portable chest 11/20/2023 and earlier. FINDINGS: Portable AP semi upright view at 0854 hours. Chronic cardiomegaly, mild elevated left hemidiaphragm, left chest cardiac pacemaker. Stable lung volumes, mediastinal contours. Visualized tracheal air column is within normal limits. Allowing for portable technique the lungs are clear. No pneumothorax or pleural effusion. Advanced bilateral shoulder degeneration. No acute osseous abnormality identified. Paucity of bowel gas. IMPRESSION: 1. No acute cardiopulmonary abnormality or acute traumatic injury identified. 2. Chronic cardiomegaly, pacemaker. Electronically Signed   By: VEAR Hurst M.D.   On: 03/14/2024 09:52   DG Pelvis 1-2 Views Result Date:  03/14/2024 CLINICAL DATA:  89 year old female status post fall in closet. Struck head. Hypertensive, 210 systolic. EXAM: PELVIS - 1-2 VIEW COMPARISON:  CT abdomen 11/25/2023. Intraoperative right hip images 12423. FINDINGS: Portable AP supine view at 0856 hours. Bipolar type right hip arthroplasty partially visible, grossly stable. Bone mineralization is within normal limits for age. Left femoral head degenerated, normally located. No acute pelvis fracture identified. Grossly intact proximal femurs. Nonobstructed bowel-gas pattern. Partially visible advanced lumbar disc and endplate degeneration. IMPRESSION: No acute fracture or dislocation identified about the pelvis. Electronically Signed   By: VEAR Hurst M.D.   On: 03/14/2024 09:41   CT Cervical Spine Wo Contrast Result Date: 03/14/2024 CLINICAL DATA:  89 year old female status post fall in closet. Struck head. Hypertensive, 210 systolic. EXAM: CT CERVICAL SPINE WITHOUT CONTRAST TECHNIQUE: Multidetector CT imaging of the cervical spine was performed without  intravenous contrast. Multiplanar CT image reconstructions were also generated. RADIATION DOSE REDUCTION: This exam was performed according to the departmental dose-optimization program which includes automated exposure control, adjustment of the mA and/or kV according to patient size and/or use of iterative reconstruction technique. COMPARISON:  Head CT today.  Cervical spine CT 12/01/2022. FINDINGS: Alignment: Stable straightening of cervical lordosis. Chronic cirrhosis or assay ankylosis. Bilateral posterior element alignment is within normal limits. Skull base and vertebrae: Visualized skull base is intact. No atlanto-occipital dissociation. C1 and C2 are chronically degenerated but appear intact and aligned. Diffuse idiopathic skeletal hyperostosis (DISH). No acute osseous abnormality identified. Soft tissues and spinal canal: No prevertebral fluid or swelling. No visible canal hematoma. However, globular  hyperdense hemorrhage again suspected in the left posterior fossa just above the cisterna magna (series 6, image 19). This is contiguous with the foramen of Luschka. Severe calcified cervical carotid atherosclerosis. Disc levels: Advanced cervical spine degeneration with Diffuse idiopathic skeletal hyperostosis (DISH). Chronic cervicothoracic junction ankylosis. Bulky endplate osteophytes elsewhere. Appearance is stable from the CT last year. Upper chest: Visible upper thoracic levels appear intact. Chronic left chest pacemaker leads. Negative lung apices. IMPRESSION: 1. No acute traumatic injury identified in the cervical spine. 2. Suspected acute subarachnoid space hemorrhage redemonstrated just above the left cisterna magna, somewhat contiguous with the foramen of Luschka. See Head CT reported separately. 3. Advanced chronic cervical spine degeneration with Diffuse idiopathic skeletal hyperostosis (DISH) appears stable from last year. Electronically Signed   By: VEAR Hurst M.D.   On: 03/14/2024 09:36    Review of Systems  Constitutional: Negative.   HENT: Negative.    Eyes: Negative.   Respiratory: Negative.    Cardiovascular: Negative.   Gastrointestinal: Negative.   Genitourinary: Negative.   Musculoskeletal:  Positive for neck pain.  Skin: Negative.   Neurological:  Positive for loss of consciousness.       Patient's husband feels patient blacked out causing the fall.  Endo/Heme/Allergies: Negative.   Psychiatric/Behavioral: Negative.     Blood pressure (!) 127/41, pulse 72, temperature 98 F (36.7 C), temperature source Oral, resp. rate 19, height 5' 8 (1.727 m), weight 93 kg, last menstrual period 07/22/1968, SpO2 94%. Estimated body mass index is 31.17 kg/m as calculated from the following:   Height as of this encounter: 5' 8 (1.727 m).   Weight as of this encounter: 93 kg.  Physical Exam HENT:     Head: Normocephalic.      Nose: Nose normal.     Mouth/Throat:     Mouth:  Mucous membranes are moist.     Pharynx: Oropharynx is clear.  Eyes:     Extraocular Movements: Extraocular movements intact.     Pupils: Pupils are equal, round, and reactive to light.  Cardiovascular:     Rate and Rhythm: Normal rate. Rhythm irregular.  Pulmonary:     Effort: Pulmonary effort is normal. No respiratory distress.  Abdominal:     General: Abdomen is flat.     Palpations: Abdomen is soft.  Musculoskeletal:        General: Normal range of motion.     Cervical back: Normal range of motion and neck supple.     Comments: Recent right total knee replacement by Dr. Sheril  Skin:    General: Skin is warm and dry.     Capillary Refill: Capillary refill takes less than 2 seconds.  Neurological:     General: No focal deficit present.     Mental Status:  She is alert and oriented to person, place, and time.  Psychiatric:        Mood and Affect: Mood normal.        Behavior: Behavior normal.        Thought Content: Thought content normal.        Judgment: Judgment normal.     Assessment/Plan: Patient suffered a fall on Elilquis, sustaining a small interhemispheric hemorrhage. Reversal of Eliquis  and repeat imaging at the discretion of the emergency provider. The patient is neurologically intact presently. Recommend observation, cardiology work up, and perhaps physical therapy could see her prior to discharge given her history of unsteady gait. This patient is not a good candidate for surgical intervention should her condition worsen. SBP goal less than 150.  I am in communication with my attending and they agree with the plan for this patient.   Gerard Beck, DNP, AGNP-C Nurse Practitioner  [facility] [address], [place], [place] [address] P: [phone]    F: [phone]  03/14/2024, 10:00 AM

## 2024-03-14 NOTE — H&P (Signed)
 History and Physical    Patient: Brittany [name] FMW:[id number] DOB: [birth year] DOA: 03/14/2024 DOS: the patient was seen and examined on 03/14/2024 PCP: Sun, Vyvyan, MD  Patient coming from: Home  Chief Complaint: No chief complaint on file.  HPI: AARALYNN [name] is a 89 y.o. female with medical history significant of A-fib on Eliquis , hypertension, OSA on CPAP and bilateral TKR with recent right TKR who was found down in her walk-in closet and is admitted for syncope.  Pt unable to provide medical history. From what I can gather per chart review and conversation with EDP, pt woke up, walked to her closet and her husband heard a big bang/thud after which he found his wife on the floor of her closet after which he activated EMS.  In the ED, pt was hypertensive and initially required nicardipine  gtt that was d/c. Labs notable for Na 130, K 5.4, and troponin 35-->31. CT head showed small subdural hematoma [4 mm] on the left para falacine area and a 2 cm area concerning for subdural hemorrhage on left posterior fossa. EDP consulted NSGY who recommended BP control and medicine admission.  Review of Systems: As mentioned in the history of present illness. All other systems reviewed and are negative. Past Medical History:  Diagnosis Date   Ankle fracture 2016   Arthritis    Atrial fibrillation (HCC)    Depression    Dysrhythmia    Elevated parathyroid hormone    Fracture of orbital floor with routine healing    GERD (gastroesophageal reflux disease)    History of colon polyps    benign   Hypercalcemia    Hypertension    Hyponatremia    Hypothyroid    Macular degeneration    wet in the right and dry in the left   OSA on CPAP    Osteoporosis    Peripheral vascular disease (HCC)    Presence of permanent cardiac pacemaker    PSVT (paroxysmal supraventricular tachycardia) (HCC)    Rotator cuff tear    Sinus node dysfunction (HCC)    a. s/p MDT pacemaker   Stroke [facility])    Urinary  incontinence    Wrist fracture 2018   Past Surgical History:  Procedure Laterality Date   BACK SURGERY     CARDIOVERSION N/A 05/02/2022   Procedure: CARDIOVERSION;  Surgeon: Mona Vinie BROCKS, MD;  Location: [facility] ENDOSCOPY;  Service: Cardiovascular;  Laterality: N/A;   CATARACT EXTRACTION     CATARACT EXTRACTION W/ INTRAOCULAR LENS  IMPLANT, BILATERAL Bilateral    COLONOSCOPY     ESOPHAGOGASTRODUODENOSCOPY     IR ANGIO INTRA EXTRACRAN SEL COM CAROTID INNOMINATE BILAT MOD SED  12/30/2017   IR ANGIO VERTEBRAL SEL VERTEBRAL BILAT MOD SED  12/30/2017   LAPAROSCOPIC CHOLECYSTECTOMY  08/11/1999   LUMBAR DISC SURGERY  02/19/2014   ORIF ANKLE FRACTURE Left 04/13/2015   Procedure: OPEN REDUCTION INTERNAL FIXATION (ORIF) LEFT ANKLE FRACTURE;  Surgeon: Glendia Cordella Hutchinson, MD;  Location: [facility] OR;  Service: Orthopedics;  Laterality: Left;   PACEMAKER PLACEMENT Right 07/22/2010   a. MDT dual chamber PPM implanted by Dr Fernande    [facility] GENERATOR CHANGEOUT N/A 08/22/2022   Procedure: PPM GENERATOR HERMA;  Surgeon: Fernande Elspeth BROCKS, MD;  Location: [facility] INVASIVE CV LAB;  Service: Cardiovascular;  Laterality: N/A;   SHOULDER ARTHROSCOPY W/ ROTATOR CUFF REPAIR Right 08/30/2014   WITH MINI-OPEN ROTATOR CUFF REPAIR AND SUBACROMIAL DECOMPRESSION   SHOULDER ARTHROSCOPY WITH ROTATOR CUFF REPAIR AND SUBACROMIAL DECOMPRESSION Right 08/30/2014  Procedure: SHOULDER ARTHROSCOPY WITH MINI-OPEN ROTATOR CUFF REPAIR AND SUBACROMIAL DECOMPRESSION;  Surgeon: Cordella Glendia Hutchinson, MD;  Location: [facility] OR;  Service: Orthopedics;  Laterality: Right;  RIGHT SHOULDER DIAGNOSTIC OPERATIVE ARTHROSCOPY, SUBACROMIAL DECOMPRESSION, MINI-OPEN ROTATOR CUFF REPAIR.   TOTAL HIP ARTHROPLASTY Right 08/14/2021   Procedure: TOTAL HIP ARTHROPLASTY ANTERIOR APPROACH;  Surgeon: Sheril Coy, MD;  Location: [facility] ORS;  Service: Orthopedics;  Laterality: Right;   TOTAL KNEE ARTHROPLASTY Left 02/13/2021   Procedure: LEFT TOTAL KNEE ARTHROPLASTY;  Surgeon:  Sheril Coy, MD;  Location: [facility] ORS;  Service: Orthopedics;  Laterality: Left;   TOTAL KNEE ARTHROPLASTY Right 11/11/2023   Procedure: ARTHROPLASTY, KNEE, TOTAL;  Surgeon: Sheril Coy, MD;  Location: [facility] ORS;  Service: Orthopedics;  Laterality: Right;  RIGHT KNEE ARTHROPLASTY   VAGINAL HYSTERECTOMY  07/22/1968   WRIST FRACTURE SURGERY Left    Social History:  reports that she quit smoking about 33 years ago. Her smoking use included cigarettes. She started smoking about 78 years ago. She has a 45 pack-year smoking history. She has never used smokeless tobacco. She reports current alcohol  use of about 28.0 standard drinks of alcohol  per week. She reports that she does not use drugs.  Allergies  Allergen Reactions   Citalopram  Hydrobromide Other (See Comments)    QT prolongation with Tikosyn    Adhesive [Tape] Swelling and Rash   Bacitracin-Polymyxin B Rash   Benzalkonium Chloride Rash    Pt was not aware of this allergy   Neosporin [Neomycin-Polymyxin-Gramicidin] Swelling and Rash    Family History  Problem Relation Age of Onset   Colon cancer Mother        colon   Cancer Father        unknown type   Stroke Sister    Skin cancer Brother    Heart disease Brother    Breast cancer Paternal Aunt    Asthma Brother     Prior to Admission medications   Medication Sig Start Date End Date Taking? Authorizing Provider  acetaminophen  (TYLENOL ) 500 MG tablet Take 500 mg by mouth 2 (two) times daily.   Yes [provider]  alendronate  (FOSAMAX ) 70 MG tablet Take 1 tablet (70 mg total) by mouth every 7 (seven) days. Take with a full glass of water  on an empty stomach. Patient taking differently: Take 70 mg by mouth every Wednesday. Take with a full glass of water  on an empty stomach. 03/04/17  Yes Kuneff, Renee A, DO  apixaban  (ELIQUIS ) 5 MG TABS tablet Take 1 tablet (5 mg total) by mouth 2 (two) times daily. 04/11/23  Yes Fernande Elspeth BROCKS, MD  atorvastatin  (LIPITOR) 40 MG tablet  Take 40 mg by mouth in the morning.   Yes [provider]  B Complex-C (B-COMPLEX WITH VITAMIN C) tablet Take 1 tablet by mouth in the morning.   Yes [provider]  Cholecalciferol  (VITAMIN D3) 125 MCG (5000 UT) TABS Take 5,000 Units by mouth daily with breakfast.   Yes [provider]  dapagliflozin  propanediol (FARXIGA ) 10 MG TABS tablet Take 1 tablet (10 mg total) by mouth daily before breakfast. 04/21/23  Yes Fernande Elspeth BROCKS, MD  digoxin  (LANOXIN ) 0.125 MG tablet Take 1 tablet (0.125 mg total) by mouth daily. 05/13/23  Yes Fernande Elspeth BROCKS, MD  furosemide  (LASIX ) 40 MG tablet Take 2 tablets (80 mg total) by mouth every other day. TAKE 80 MG ALTERNATING 40 MG EVERY OTHER DAY Patient taking differently: Take 40-80 mg by mouth every other day. TAKE 80 MG  ALTERNATING 40 MG EVERY OTHER DAY 06/26/23  Yes Ursuy, Renee Lynn, PA-C  levothyroxine  (SYNTHROID ) 88 MCG tablet Take 88 mcg by mouth daily before breakfast.   Yes [provider]  Lutein  20 MG TABS Take 20 mg by mouth daily at 12 noon.   Yes [provider]  magnesium  oxide (MAG-OX) 400 MG tablet Take 1 tablet (400 mg total) by mouth daily. 03/21/14  Yes Fernande Elspeth BROCKS, MD  Melatonin 10 MG TABS Take 10-40 mg by mouth at bedtime as needed (sleep).   Yes [provider]  metoprolol  succinate (TOPROL -XL) 50 MG 24 hr tablet 1 tablet Orally twice a day 11/17/23  Yes [provider]  Multiple Vitamin (MULTIVITAMIN WITH MINERALS) TABS tablet Take 1 tablet by mouth daily at 12 noon.   Yes [provider]  Multiple Vitamins-Minerals (PRESERVISION AREDS 2 PO) Take 1 capsule by mouth 2 (two) times daily with a meal.    Yes [provider]  sertraline  (ZOLOFT ) 50 MG tablet Take 1 tablet (50 mg total) by mouth daily. 03/22/19 03/14/24 Yes Dow Arland BROCKS, NP  spironolactone  (ALDACTONE ) 25 MG tablet Take 1 tablet (25 mg total) by mouth daily. Patient taking differently: Take 25 mg by  mouth daily at 12 noon. 10/14/23  Yes Zenaida Morene PARAS, MD  HYDROcodone -acetaminophen  (NORCO/VICODIN) 5-325 MG tablet Take 1 tablet by mouth every 6 (six) hours as needed for moderate pain (pain score 4-6) or severe pain (pain score 7-10) (post op pain). Patient not taking: Reported on 03/14/2024 11/11/23 11/10/24  Lenis Barter, PA-C    Physical Exam: Vitals:   03/14/24 1251 03/14/24 1315 03/14/24 1331 03/14/24 1345  BP: (!) 146/83 139/75  (!) 149/57  Pulse: 65 75  75  Resp: 16 (!) 21  20  Temp:   98.2 F (36.8 C)   TempSrc:   Oral   SpO2: 93% 94%  99%  Weight:      Height:       General: Alert, oriented x3, resting comfortably in no acute distress Respiratory: Lungs clear to auscultation bilaterally with normal respiratory effort; no w/r/r Cardiovascular: Regular rate and rhythm w/o m/r/g   Data Reviewed:  Lab Results  Component Value Date   WBC 9.6 03/14/2024   HGB 13.5 03/14/2024   HCT 41.5 03/14/2024   MCV 92.6 03/14/2024   PLT 267 03/14/2024   Lab Results  Component Value Date   GLUCOSE 130 (H) 03/14/2024   CALCIUM  9.9 03/14/2024   NA 130 (L) 03/14/2024   K 5.4 (H) 03/14/2024   CO2 23 03/14/2024   CL 97 (L) 03/14/2024   BUN 11 03/14/2024   CREATININE 0.87 03/14/2024   Lab Results  Component Value Date   ALT 24 03/14/2024   AST 30 03/14/2024   ALKPHOS 83 03/14/2024   BILITOT 1.2 03/14/2024   Lab Results  Component Value Date   INR 1.2 03/14/2024   INR 1.1 02/08/2021   INR 0.99 12/30/2017   Radiology: CT Head Wo Contrast Addendum Date: 03/14/2024 ADDENDUM REPORT: 03/14/2024 09:53 ADDENDUM: Study discussed by telephone with Dr. CARON SALT on 03/14/2024 at 0937 hours. And he advises the patient is on Eliquis . I suspect acute subarachnoid hemorrhage in the left lower posterior fossa. Electronically Signed   By: VEAR Hurst M.D.   On: 03/14/2024 09:53   Result Date: 03/14/2024 CLINICAL DATA:  89 year old female status post fall in closet. Struck head.  Hypertensive, 210 systolic. EXAM: CT HEAD WITHOUT CONTRAST TECHNIQUE: Contiguous axial images were  obtained from the base of the skull through the vertex without intravenous contrast. RADIATION DOSE REDUCTION: This exam was performed according to the departmental dose-optimization program which includes automated exposure control, adjustment of the mA and/or kV according to patient size and/or use of iterative reconstruction technique. COMPARISON:  Head CT 02/04/2023. FINDINGS: Brain: New since last year hyperdense extra-axial space-occupying mass at the left ventral posterior fossa, series 3, image 8). This involves the left pre medullary cistern. This involves an area of about 2 cm (sagittal image 37). Not visible last year. Additionally, no regional mass effect. And superimposed supratentorial hyperdense para falcine subdural hematoma, up to 3 mm in thickness. No IVH or ventriculomegaly. Stable cerebral volume since last year. Advanced chronic cerebral white matter disease. Chronic heterogeneity in the deep gray nuclei compatible with small vessel disease. No cortically based acute infarct identified. Vascular: Calcified atherosclerosis at the skull base. No suspicious intracranial vascular hyperdensity. Skull: Stable and intact. Sinuses/Orbits: Resolved paranasal sinus mucosal thickening last year. Visualized paranasal sinuses and mastoids are well aerated. Other: Chronic right forehead scalp soft tissue scarring. No acute orbit or scalp soft tissue injury identified. Traumatic Brain Injury Risk Stratification Skull Fracture: No - Low/mBIG 1 Subdural Hematoma (SDH): <45mm - mBIG 1 Subarachnoid Hemorrhage [facility]): Probable left posterior fossa subarachnoid hemorrhage. Epidural Hematoma (EDH): No - Low/mBIG 1 Cerebral contusion, intra-axial, intraparenchymal Hemorrhage (IPH): No Intraventricular Hemorrhage (IVH): No - Low/mBIG 1 Midline Shift > 1mm or Edema/effacement of sulci/vents: No - Low/mBIG 1  ---------------------------------------------------- IMPRESSION: 1. Less than 4 mm para falcine subdural hematoma. And additionally, 2 cm area of hyperdense hemorrhage versus mass in the left lower posterior fossa, left pre medullary cistern. 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 2. No intracranial mass effect or midline shift. 3. Stable non contrast CT appearance of chronic small vessel disease. Electronically Signed: By: VEAR Hurst M.D. On: 03/14/2024 09:33   DG Chest Portable 1 View Result Date: 03/14/2024 CLINICAL DATA:  89 year old female status post fall in closet. Struck head. Hypertensive, 210 systolic. On Eliquis . EXAM: PORTABLE CHEST 1 VIEW COMPARISON:  Portable chest 11/20/2023 and earlier. FINDINGS: Portable AP semi upright view at 0854 hours. Chronic cardiomegaly, mild elevated left hemidiaphragm, left chest cardiac pacemaker. Stable lung volumes, mediastinal contours. Visualized tracheal air column is within normal limits. Allowing for portable technique the lungs are clear. No pneumothorax or pleural effusion. Advanced bilateral shoulder degeneration. No acute osseous abnormality identified. Paucity of bowel gas. IMPRESSION: 1. No acute cardiopulmonary abnormality or acute traumatic injury identified. 2. Chronic cardiomegaly, pacemaker. Electronically Signed   By: VEAR Hurst M.D.   On: 03/14/2024 09:52   DG Pelvis 1-2 Views Result Date: 03/14/2024 CLINICAL DATA:  89 year old female status post fall in closet. Struck head. Hypertensive, 210 systolic. EXAM: PELVIS - 1-2 VIEW COMPARISON:  CT abdomen 11/25/2023. Intraoperative right hip images 12423. FINDINGS: Portable AP supine view at 0856 hours. Bipolar type right hip arthroplasty partially visible, grossly stable. Bone mineralization is within normal limits for age. Left femoral head degenerated, normally located. No acute pelvis fracture identified. Grossly  intact proximal femurs. Nonobstructed bowel-gas pattern. Partially visible advanced lumbar disc and endplate degeneration. IMPRESSION: No acute fracture or dislocation identified about the pelvis. Electronically Signed   By: VEAR Hurst M.D.   On: 03/14/2024 09:41   CT Cervical Spine Wo Contrast Result Date: 03/14/2024 CLINICAL DATA:  89 year old female status post fall in closet. Struck head. Hypertensive,  210 systolic. EXAM: CT CERVICAL SPINE WITHOUT CONTRAST TECHNIQUE: Multidetector CT imaging of the cervical spine was performed without intravenous contrast. Multiplanar CT image reconstructions were also generated. RADIATION DOSE REDUCTION: This exam was performed according to the departmental dose-optimization program which includes automated exposure control, adjustment of the mA and/or kV according to patient size and/or use of iterative reconstruction technique. COMPARISON:  Head CT today.  Cervical spine CT 12/01/2022. FINDINGS: Alignment: Stable straightening of cervical lordosis. Chronic cirrhosis or assay ankylosis. Bilateral posterior element alignment is within normal limits. Skull base and vertebrae: Visualized skull base is intact. No atlanto-occipital dissociation. C1 and C2 are chronically degenerated but appear intact and aligned. Diffuse idiopathic skeletal hyperostosis (DISH). No acute osseous abnormality identified. Soft tissues and spinal canal: No prevertebral fluid or swelling. No visible canal hematoma. However, globular hyperdense hemorrhage again suspected in the left posterior fossa just above the cisterna magna (series 6, image 19). This is contiguous with the foramen of Luschka. Severe calcified cervical carotid atherosclerosis. Disc levels: Advanced cervical spine degeneration with Diffuse idiopathic skeletal hyperostosis (DISH). Chronic cervicothoracic junction ankylosis. Bulky endplate osteophytes elsewhere. Appearance is stable from the CT last year. Upper chest: Visible upper  thoracic levels appear intact. Chronic left chest pacemaker leads. Negative lung apices. IMPRESSION: 1. No acute traumatic injury identified in the cervical spine. 2. Suspected acute subarachnoid space hemorrhage redemonstrated just above the left cisterna magna, somewhat contiguous with the foramen of Luschka. See Head CT reported separately. 3. Advanced chronic cervical spine degeneration with Diffuse idiopathic skeletal hyperostosis (DISH) appears stable from last year. Electronically Signed   By: VEAR Hurst M.D.   On: 03/14/2024 09:36    Assessment and Plan: 25F h/o A-fib on Eliquis , hypertension, OSA on CPAP and bilateral TKR with recent right TKR who was found down in her walk-in closet and is admitted for syncope.  Syncope -PT/OT consulted; apprec eval/recs -Tele -MIVF: NS at 100cc/h for 24h -F/u orthostatic vitals -F/u admission blood cultures (low suspicion for bacteremia) -F/u EtOH level -F/u EEG; if abnl consult Neurology -F/u TTE to eval for structural/valvular heart disease -Consider 14d Holter monitor on discharge if no other obvious etiology  SDH -NSGY consulted; apprec eval/recs -CCM consulted; apprec eval/recs -F/u repeat CTH in 6h -SBP <140-150  Afib s/p PPM -Cards consulted for PPM interrogation; apprec eval/recsPTA metoprolol  XL 50mg  daily -HOLD pta Eliquis  per SDH; resume per stable CTH and NSGY recs  HTN -PTA metoprolol , lasix , and spironolactone ; if BP remains elevated consider ACEi  OSA -PTA CPAP at bedtime   Advance Care Planning:   Code Status: Limited: Do not attempt resuscitation (DNR) -DNR-LIMITED -Do Not Intubate/DNI    Consults: NSGY and CCM  Family Communication: Husband  Severity of Illness: The appropriate patient status for this patient is OBSERVATION. Observation status is judged to be reasonable and necessary in order to provide the required intensity of service to ensure the patient's safety. The patient's presenting symptoms, physical exam  findings, and initial radiographic and laboratory data in the context of their medical condition is felt to place them at decreased risk for further clinical deterioration. Furthermore, it is anticipated that the patient will be medically stable for discharge from the hospital within 2 midnights of admission.    ------- I spent 55 minutes reviewing previous notes, at the bedside counseling/discussing the treatment plan, and performing clinical documentation.  Author: Marsha Ada, MD 03/14/2024 2:42 PM  For on call review [email or web address].

## 2024-03-14 NOTE — Plan of Care (Signed)
   Problem: Activity: Goal: Risk for activity intolerance will decrease Outcome: Progressing   Problem: Nutrition: Goal: Adequate nutrition will be maintained Outcome: Progressing   Problem: Coping: Goal: Level of anxiety will decrease Outcome: Progressing

## 2024-03-14 NOTE — Progress Notes (Signed)
 Orthopedic Tech Progress Note Patient Details:  Brittany [name] [birth year] [id number]  Patient ID: Brittany [name], female   DOB: [birth year], 89 y.o.   MRN: [id number] Level II' not currently needed. Brittany [name] 03/14/2024, 8:52 AM

## 2024-03-15 ENCOUNTER — Observation Stay (HOSPITAL_COMMUNITY)

## 2024-03-15 ENCOUNTER — Inpatient Hospital Stay (HOSPITAL_COMMUNITY)

## 2024-03-15 DIAGNOSIS — I160 Hypertensive urgency: Secondary | ICD-10-CM | POA: Diagnosis present

## 2024-03-15 DIAGNOSIS — W1830XA Fall on same level, unspecified, initial encounter: Secondary | ICD-10-CM | POA: Diagnosis present

## 2024-03-15 DIAGNOSIS — I609 Nontraumatic subarachnoid hemorrhage, unspecified: Secondary | ICD-10-CM | POA: Diagnosis present

## 2024-03-15 DIAGNOSIS — I482 Chronic atrial fibrillation, unspecified: Secondary | ICD-10-CM | POA: Diagnosis present

## 2024-03-15 DIAGNOSIS — E039 Hypothyroidism, unspecified: Secondary | ICD-10-CM | POA: Diagnosis present

## 2024-03-15 DIAGNOSIS — Z8249 Family history of ischemic heart disease and other diseases of the circulatory system: Secondary | ICD-10-CM | POA: Diagnosis not present

## 2024-03-15 DIAGNOSIS — R402412 Glasgow coma scale score 13-15, at arrival to emergency department: Secondary | ICD-10-CM | POA: Diagnosis present

## 2024-03-15 DIAGNOSIS — R569 Unspecified convulsions: Secondary | ICD-10-CM

## 2024-03-15 DIAGNOSIS — Z66 Do not resuscitate: Secondary | ICD-10-CM | POA: Diagnosis present

## 2024-03-15 DIAGNOSIS — I10 Essential (primary) hypertension: Secondary | ICD-10-CM | POA: Diagnosis present

## 2024-03-15 DIAGNOSIS — G4733 Obstructive sleep apnea (adult) (pediatric): Secondary | ICD-10-CM | POA: Diagnosis present

## 2024-03-15 DIAGNOSIS — Z803 Family history of malignant neoplasm of breast: Secondary | ICD-10-CM | POA: Diagnosis not present

## 2024-03-15 DIAGNOSIS — Z950 Presence of cardiac pacemaker: Secondary | ICD-10-CM | POA: Diagnosis not present

## 2024-03-15 DIAGNOSIS — Z823 Family history of stroke: Secondary | ICD-10-CM | POA: Diagnosis not present

## 2024-03-15 DIAGNOSIS — Z79890 Hormone replacement therapy (postmenopausal): Secondary | ICD-10-CM | POA: Diagnosis not present

## 2024-03-15 DIAGNOSIS — R55 Syncope and collapse: Secondary | ICD-10-CM | POA: Diagnosis not present

## 2024-03-15 DIAGNOSIS — Z7901 Long term (current) use of anticoagulants: Secondary | ICD-10-CM | POA: Diagnosis not present

## 2024-03-15 DIAGNOSIS — Z8673 Personal history of transient ischemic attack (TIA), and cerebral infarction without residual deficits: Secondary | ICD-10-CM | POA: Diagnosis not present

## 2024-03-15 DIAGNOSIS — S065XAA Traumatic subdural hemorrhage with loss of consciousness status unknown, initial encounter: Secondary | ICD-10-CM

## 2024-03-15 DIAGNOSIS — Z808 Family history of malignant neoplasm of other organs or systems: Secondary | ICD-10-CM | POA: Diagnosis not present

## 2024-03-15 DIAGNOSIS — Z7983 Long term (current) use of bisphosphonates: Secondary | ICD-10-CM | POA: Diagnosis not present

## 2024-03-15 DIAGNOSIS — Z87891 Personal history of nicotine dependence: Secondary | ICD-10-CM | POA: Diagnosis not present

## 2024-03-15 DIAGNOSIS — Z96653 Presence of artificial knee joint, bilateral: Secondary | ICD-10-CM | POA: Diagnosis present

## 2024-03-15 DIAGNOSIS — M810 Age-related osteoporosis without current pathological fracture: Secondary | ICD-10-CM | POA: Diagnosis present

## 2024-03-15 DIAGNOSIS — E871 Hypo-osmolality and hyponatremia: Secondary | ICD-10-CM | POA: Diagnosis present

## 2024-03-15 DIAGNOSIS — Z96641 Presence of right artificial hip joint: Secondary | ICD-10-CM | POA: Diagnosis present

## 2024-03-15 DIAGNOSIS — I739 Peripheral vascular disease, unspecified: Secondary | ICD-10-CM | POA: Diagnosis present

## 2024-03-15 DIAGNOSIS — Z800 Family history of malignant neoplasm of digestive organs: Secondary | ICD-10-CM | POA: Diagnosis not present

## 2024-03-15 LAB — ECHOCARDIOGRAM COMPLETE BUBBLE STUDY
Area-P 1/2: 4.31 cm2
Calc EF: 62.5 %
S' Lateral: 3.3 cm
Single Plane A2C EF: 59.7 %
Single Plane A4C EF: 64.4 %

## 2024-03-15 LAB — BASIC METABOLIC PANEL WITH GFR
Anion gap: 6 (ref 5–15)
BUN: 13 mg/dL (ref 8–23)
CO2: 24 mmol/L (ref 22–32)
Calcium: 9.5 mg/dL (ref 8.9–10.3)
Chloride: 101 mmol/L (ref 98–111)
Creatinine, Ser: 0.95 mg/dL (ref 0.44–1.00)
GFR, Estimated: 58 mL/min — ABNORMAL LOW (ref >60)
Glucose, Bld: 108 mg/dL — ABNORMAL HIGH (ref 70–99)
Potassium: 4.3 mmol/L (ref 3.5–5.1)
Sodium: 131 mmol/L — ABNORMAL LOW (ref 135–145)

## 2024-03-15 NOTE — Progress Notes (Signed)
 PT Cancellation Note  Patient Details Name: Brittany [name] MRN: [id number] DOB: 02-19-1934   Cancelled Treatment:    Reason Eval/Treat Not Completed: (P) Patient at procedure or test/unavailable Pt having EEG electrodes placed. PT will follow back for Evaluation once completed.  Brittany [name] PT, DPT Acute Rehabilitation Services Please use secure chat or  Call Office [phone]    Brittany [name] [name] 03/15/2024, 8:55 AM

## 2024-03-15 NOTE — Progress Notes (Signed)
 EEG complete - results pending

## 2024-03-15 NOTE — Evaluation (Signed)
 Physical Therapy Evaluation Patient Details Name: Brittany [name] MRN: [id number] DOB: 03/31/1934 Today's Date: 03/15/2024  History of Present Illness  89 y.o. female who was found down by her husband in her closet at [facility] 8/24. Brought to [facility] for evaluation of syncopal event. CT head showed small subdural hematoma (4 mm) on the left para falacine area and a 2 cm area concerning for subdural hemorrhage on left posterior fossa. PMH: PPM A-fib on Eliquis , hypertension, OSA on CPAP and bilateral TKR with recent right TKR  Clinical Impression  PTA pt recovering from R TKR working with PT to increase strength and ambulation. Pt independent in ADLs, and ambulation with Rollator. Facility provides for iADLs. Pt reports that she has had numerous falls but this one she does not remember. Pt is limited in safe mobility by dizziness with activity, decreased strength, and decreased endurance. Pt is supervision for bed mobility, contact guard to min A for transfers and contact guard to min A for ambulation with RW. PT recommends resumption of PT at [facility] to progress her ambulation and strength. PT will continue to follow acutely.         If plan is discharge home, recommend the following: A little help with walking and/or transfers;A little help with bathing/dressing/bathroom;Assistance with cooking/housework;Direct supervision/assist for medications management;Direct supervision/assist for financial management;Assist for transportation;Help with stairs or ramp for entrance   Can travel by private vehicle    Yes    Equipment Recommendations None recommended by PT     Functional Status Assessment Patient has had a recent decline in their functional status and demonstrates the ability to make significant improvements in function in a reasonable and predictable amount of time.     Precautions / Restrictions Precautions Precautions: Fall;Other (comment) Precaution/Restrictions Comments: SBP  goal < 150 Restrictions Weight Bearing Restrictions Per Provider Order: No      Mobility  Bed Mobility Overal bed mobility: Needs Assistance Bed Mobility: Supine to Sit     Supine to sit: HOB elevated, Used rails, Supervision     General bed mobility comments: increased time and effort to come to seated EoB, no outside assist required    Transfers Overall transfer level: Needs assistance Equipment used: Rolling walker (2 wheels) Transfers: Sit to/from Stand, Bed to chair/wheelchair/BSC Sit to Stand: Contact guard assist, Min assist           General transfer comment: initial attempt hips too far back in the bed with scoot forward pt able to come to standing with CGA, incontinent of urine on standing which pt reports is not her normal, agreeable to walk into the bathroom, pt require min A for power up from low toilet with use of hand rail    Ambulation/Gait Ambulation/Gait assistance: Contact guard assist Gait Distance (Feet): 18 Feet (+18) Assistive device: Rolling walker (2 wheels) Gait Pattern/deviations: Step-through pattern, Decreased weight shift to right, Decreased step length - left, Shuffle, Trunk flexed Gait velocity: slowed     General Gait Details: contact guard for safety with ambulation into bathroom, with return to recliner pt reports dizziness, sat on EoB, when dizziness subsided able to get up and transfer to recliner with contact guard      Balance Overall balance assessment: Needs assistance, History of Falls Sitting-balance support: No upper extremity supported, Feet supported Sitting balance-Leahy Scale: Fair     Standing balance support: Bilateral upper extremity supported, During functional activity Standing balance-Leahy Scale: Poor  Pertinent Vitals/Pain Pain Assessment Pain Assessment: Faces Faces Pain Scale: Hurts a little bit Pain Location: back of neck/head Pain Descriptors / Indicators:  Sore Pain Intervention(s): Limited activity within patient's tolerance, Monitored during session, Repositioned    Home Living Family/patient expects to be discharged to:: Other (Comment) Living Arrangements: Spouse/significant other                 Additional Comments: Lives at [facility] with spouse. Has handicapped accessible unit, shower chair and Rollator    Prior Function Prior Level of Function : Independent/Modified Independent             Mobility Comments: uses rollator. reports she has been going to PT for her knee ADLs Comments: Mod I for ADLs, initially used shower chair after knee replacement but has not needed recently. hx of falls.     Extremity/Trunk Assessment   Upper Extremity Assessment Upper Extremity Assessment: Defer to OT evaluation    Lower Extremity Assessment Lower Extremity Assessment: RLE deficits/detail;LLE deficits/detail RLE Deficits / Details: new TKA, has been working with PT knee flex approx 95 degrees, extension lacking ~5 degrees, strength grossly -4/5 LLE Deficits / Details: ROM WFL , strength grossly 4/5    Cervical / Trunk Assessment Cervical / Trunk Assessment: Normal  Communication   Communication Communication: Impaired Factors Affecting Communication: Hearing impaired;Other (comment) (has hearing aides)    Cognition Arousal: Alert Behavior During Therapy: WFL for tasks assessed/performed                             Following commands: Intact       Cueing Cueing Techniques: Verbal cues, Gestural cues     General Comments General comments (skin integrity, edema, etc.): after dizziness with ambulation back from bathroom, BP 135/60, HR 76 and SpO2 on RA 92%O2, with sitting up in chair pt reports she still doesn't feel right but does state that her neck is hurting husband in room        Assessment/Plan    PT Assessment Patient needs continued PT services  PT Problem List Decreased  strength;Decreased activity tolerance;Decreased balance;Decreased mobility;Pain;Decreased safety awareness       PT Treatment Interventions DME instruction;Gait training;Functional mobility training;Therapeutic activities;Therapeutic exercise;Balance training;Cognitive remediation;Patient/family education    PT Goals (Current goals can be found in the Care Plan section)  Acute Rehab PT Goals Patient Stated Goal: go home PT Goal Formulation: With patient/family Time For Goal Achievement: 03/29/24 Potential to Achieve Goals: Good    Frequency Min 2X/week        AM-PAC PT 6 Clicks Mobility  Outcome Measure Help needed turning from your back to your side while in a flat bed without using bedrails?: None Help needed moving from lying on your back to sitting on the side of a flat bed without using bedrails?: None Help needed moving to and from a bed to a chair (including a wheelchair)?: A Little Help needed standing up from a chair using your arms (e.g., wheelchair or bedside chair)?: A Little Help needed to walk in hospital room?: A Little Help needed climbing 3-5 steps with a railing? : A Lot 6 Click Score: 19    End of Session   Activity Tolerance: Treatment limited secondary to medical complications (Comment) (dizziness with ambulation) Patient left: in chair;with call bell/phone within reach;with chair alarm set;with family/visitor present Nurse Communication: Mobility status;Other (comment) (need for fresh purewick due to incontinence and fresh linens) PT  Visit Diagnosis: Unsteadiness on feet (R26.81);Muscle weakness (generalized) (M62.81);Difficulty in walking, not elsewhere classified (R26.2);Dizziness and giddiness (R42);Pain;History of falling (Z91.81);Repeated falls (R29.6) Pain - part of body:  (neck from fall)    Time: 1005-1029 PT Time Calculation (min) (ACUTE ONLY): 24 min   Charges:   PT Evaluation $PT Eval Moderate Complexity: 1 Mod PT Treatments $Gait  Training: 8-22 mins PT General Charges $$ ACUTE PT VISIT: 1 Visit         Tyshana Nishida B. Fleeta Lapidus PT, DPT Acute Rehabilitation Services Please use secure chat or  Call Office [phone]   Almarie KATHEE Fleeta [facility] 03/15/2024, 10:49 AM

## 2024-03-15 NOTE — TOC CAGE-AID Note (Signed)
 Transition of Care [facility]) - CAGE-AID Screening   Patient Details  Name: Brittany [name] MRN: [id number] Date of Birth: 09-27-1933  Transition of Care [facility]) CM/SW Contact:    Brittany Cardin E Graylin Sperling, LCSW Phone Number: 03/15/2024, 10:43 AM   Clinical Narrative: Patient reports she drinks 3 glasses of wine per day. Denies other substance use. Declines resource needs.    CAGE-AID Screening:    Have You Ever Felt You Ought to Cut Down on Your Drinking or Drug Use?: No Have People Annoyed You By Critizing Your Drinking Or Drug Use?: No Have You Felt Bad Or Guilty About Your Drinking Or Drug Use?: No Have You Ever Had a Drink or Used Drugs First Thing In The Morning to Steady Your Nerves or to Get Rid of a Hangover?: No CAGE-AID Score: 0  Substance Abuse Education Offered: Yes

## 2024-03-15 NOTE — Evaluation (Signed)
 Occupational Therapy Evaluation Patient Details Name: Brittany [name] MRN: [id number] DOB: 10-Nov-1933 Today's Date: 03/15/2024   History of Present Illness   89 y.o. female who was found down by her husband in her closet at [facility] 8/24. Brought to [facility] for evaluation of syncopal event. CT head showed small subdural hematoma (4 mm) on the left para falacine area and a 2 cm area concerning for subdural hemorrhage on left posterior fossa. PMH: PPM A-fib on Eliquis , hypertension, OSA on CPAP and bilateral TKR with recent right TKR     Clinical Impressions PTA, pt lives with spouse at [facility], typically Modified Independent with ADLs/mobility using Rollator and reports actively working with PT on her R knee. Pt presents now with minor deficits in strength, endurance and dynamic standing balance. Pt able to complete bed mobility with Min A and transfers OOB using RW with CGA. Pt requiring no more than Min A for LB ADLs and reports her husband can provide light assist if needed. Anticipate good progress acutely to not require OT follow up upon DC.  BP at rest: 164/61 BP sitting EOB: 145/83 BP standing: 148/88 Pt denies dizziness     If plan is discharge home, recommend the following:   A little help with bathing/dressing/bathroom;Assistance with cooking/housework;Assist for transportation     Functional Status Assessment   Patient has had a recent decline in their functional status and demonstrates the ability to make significant improvements in function in a reasonable and predictable amount of time.     Equipment Recommendations   None recommended by OT     Recommendations for Other Services         Precautions/Restrictions   Precautions Precautions: Fall;Other (comment) Precaution/Restrictions Comments: SBP goal < 150 Restrictions Weight Bearing Restrictions Per Provider Order: No     Mobility Bed Mobility Overal bed mobility: Needs Assistance Bed  Mobility: Supine to Sit     Supine to sit: Min assist, HOB elevated, Used rails          Transfers Overall transfer level: Needs assistance Equipment used: Rolling walker (2 wheels) Transfers: Sit to/from Stand, Bed to chair/wheelchair/BSC Sit to Stand: Contact guard assist     Step pivot transfers: Contact guard assist     General transfer comment: increased effort to stand at bedside but able to do so without assist. CGA to step to recliner with RW; urine incontinence noted with transfer      Balance Overall balance assessment: Needs assistance, History of Falls Sitting-balance support: No upper extremity supported, Feet supported Sitting balance-Leahy Scale: Fair     Standing balance support: Bilateral upper extremity supported, During functional activity Standing balance-Leahy Scale: Poor                             ADL either performed or assessed with clinical judgement   ADL Overall ADL's : Needs assistance/impaired Eating/Feeding: Independent   Grooming: Set up;Sitting   Upper Body Bathing: Set up;Sitting   Lower Body Bathing: Minimal assistance;Sit to/from stand;Sitting/lateral leans Lower Body Bathing Details (indicate cue type and reason): assist for peri care and bathing lower LE after urine incontinence Upper Body Dressing : Set up;Sitting   Lower Body Dressing: Minimal assistance;Sit to/from stand;Sitting/lateral leans Lower Body Dressing Details (indicate cue type and reason): assist to change soiled socks Toilet Transfer: Contact guard assist;Stand-pivot;BSC/3in1;Rolling walker (2 wheels)   Toileting- Clothing Manipulation and Hygiene: Minimal assistance;Sitting/lateral lean;Sit to/from stand  Vision Baseline Vision/History: 1 Wears glasses Ability to See in Adequate Light: 0 Adequate Patient Visual Report: No change from baseline Vision Assessment?: No apparent visual deficits     Perception         Praxis          Pertinent Vitals/Pain Pain Assessment Pain Assessment: Faces Faces Pain Scale: Hurts a little bit Pain Location: back of neck/head Pain Descriptors / Indicators: Sore Pain Intervention(s): Monitored during session, Patient requesting pain meds-RN notified     Extremity/Trunk Assessment Upper Extremity Assessment Upper Extremity Assessment: Generalized weakness;Right hand dominant   Lower Extremity Assessment Lower Extremity Assessment: Defer to PT evaluation   Cervical / Trunk Assessment Cervical / Trunk Assessment: Normal   Communication Communication Communication: Impaired Factors Affecting Communication: Hearing impaired;Other (comment) (has hearing aides)   Cognition Arousal: Alert Behavior During Therapy: WFL for tasks assessed/performed Cognition: No family/caregiver present to determine baseline             OT - Cognition Comments: memory deficits in attempts to recall husband's phone number, able to follow directions/express needs well. likely at baseline                 Following commands: Intact       Cueing  General Comments   Cueing Techniques: Verbal cues;Gestural cues      Exercises     Shoulder Instructions      Home Living Family/patient expects to be discharged to:: Other (Comment) Living Arrangements: Spouse/significant other                               Additional Comments: Lives at [facility] with spouse. Has handicapped accessible unit, shower chair and Rollator      Prior Functioning/Environment Prior Level of Function : Independent/Modified Independent             Mobility Comments: uses rollator. reports she has been going to PT for her knee ADLs Comments: Mod I for ADLs, initially used shower chair after knee replacement but has not needed recently. hx of falls.    OT Problem List: Decreased strength;Decreased activity tolerance;Impaired balance (sitting and/or standing);Pain   OT  Treatment/Interventions: Self-care/ADL training;Therapeutic exercise;Energy conservation;DME and/or AE instruction;Therapeutic activities;Patient/family education;Balance training      OT Goals(Current goals can be found in the care plan section)   Acute Rehab OT Goals Patient Stated Goal: go home soon, no more blacking out episodes OT Goal Formulation: With patient Time For Goal Achievement: 03/29/24 Potential to Achieve Goals: Good ADL Goals Pt Will Perform Lower Body Bathing: with modified independence;sit to/from stand;sitting/lateral leans Pt Will Perform Lower Body Dressing: with modified independence;sitting/lateral leans;sit to/from stand Pt Will Transfer to Toilet: with modified independence;ambulating   OT Frequency:  Min 2X/week    Co-evaluation              AM-PAC OT 6 Clicks Daily Activity     Outcome Measure Help from another person eating meals?: None Help from another person taking care of personal grooming?: A Little Help from another person toileting, which includes using toliet, bedpan, or urinal?: A Little Help from another person bathing (including washing, rinsing, drying)?: A Little Help from another person to put on and taking off regular upper body clothing?: A Little Help from another person to put on and taking off regular lower body clothing?: A Little 6 Click Score: 19   End of Session Equipment Utilized During  Treatment: Rolling walker (2 wheels) Nurse Communication: Mobility status  Activity Tolerance: Patient tolerated treatment well Patient left: in chair;with call bell/phone within reach;with chair alarm set;with nursing/sitter in room  OT Visit Diagnosis: Unsteadiness on feet (R26.81);Other abnormalities of gait and mobility (R26.89);Muscle weakness (generalized) (M62.81);History of falling (Z91.81)                Time: 9254-9188 OT Time Calculation (min): 26 min Charges:  OT General Charges $OT Visit: 1 Visit OT Evaluation $OT  Eval Low Complexity: 1 Low  Mliss NOVAK, OTR/L Acute Rehab Services Office: [phone]   Mliss Fish 03/15/2024, 8:38 AM

## 2024-03-15 NOTE — Procedures (Signed)
 Patient Name: MAXWELL MARTORANO  MRN: [id number]  Epilepsy Attending: Arlin MALVA Krebs  Referring Physician/Provider: Georgina Basket, MD  Date: 03/15/2024 Duration: 22.52 mins  Patient history: 89yo F with syncope. EEG to evaluate for seizure  Level of alertness: Awake  AEDs during EEG study: None  Technical aspects: This EEG study was done with scalp electrodes positioned according to the 10-20 International system of electrode placement. Electrical activity was reviewed with band pass filter of 1-70Hz , sensitivity of 7 uV/mm, display speed of 9mm/sec with a 60Hz  notched filter applied as appropriate. EEG data were recorded continuously and digitally stored.  Video monitoring was available and reviewed as appropriate.  Description: The posterior dominant rhythm consists of 9 Hz activity of moderate voltage (25-35 uV) seen predominantly in posterior head regions, symmetric and reactive to eye opening and eye closing. EEG showed intermittent generalized 3 to 5 Hz theta-delta slowing. Hyperventilation and photic stimulation were not performed.     ABNORMALITY - Intermittent slow, generalized  IMPRESSION: This study is suggestive of mild diffuse encephalopathy. No seizures or epileptiform discharges were seen throughout the recording.  Nitya Cauthon O Keiana Tavella

## 2024-03-15 NOTE — Progress Notes (Signed)
 Transition of Care [facility]) - Inpatient Brief Assessment   Patient Details  Name: Brittany [name] MRN: [id number] Date of Birth: 1934-06-24  Transition of Care [facility]) CM/SW Contact:    Rosaline JONELLE Joe, RN Phone Number: 03/15/2024, 11:41 AM   Clinical Narrative: CM met with the patient at the bedside to discuss TOC needs for home when medically stable.  Patient lives with the spouse at the home and plans to return home to [facility] when stable.    DME at the home includes CPAP, Rolator, Shower chair, BSC, RW.  Patient is active with [facility] therapy department - [phone], fax # [phone].  Clinicals and Outpatient orders for therapy will be faxed to the facility for continued care.  Patient receives onsite services for therapy through [facility].  When medically stable - patient will discharge home by car with the spouse.   Transition of Care Asessment: Insurance and Status: Insurance coverage has been reviewed Patient has primary care physician: (P) Yes Home environment has been reviewed: (P) from home with spouse Prior level of function:: (P) Rolator Prior/Current Home Services: (P) Current home services (Active with [facility] therapy department onsite at [facility]) Social Drivers of Health Review: (P) SDOH reviewed interventions complete Readmission risk has been reviewed: (P) Yes Transition of care needs: (P) transition of care needs identified, TOC will continue to follow

## 2024-03-15 NOTE — Progress Notes (Signed)
 Progress Note   Patient: Brittany [name] DOB: Jan 07, 1934 DOA: 03/14/2024     0 DOS: the patient was seen and examined on 03/15/2024   Brief hospital course: Brittany [name] is a 89 y.o. female with medical history significant of A-fib on Eliquis , hypertension, OSA on CPAP and bilateral TKR with recent right TKR who was found down in her walk-in closet and is admitted for syncope.   Pt unable to provide medical history. From what I can gather per chart review and conversation with EDP, pt woke up, walked to her closet and her husband heard a big bang/thud after which he found his wife on the floor of her closet after which he activated EMS.   In the ED, pt was hypertensive and initially required nicardipine  gtt that was d/c. Labs notable for Na 130, K 5.4, and troponin 35-->31. CT head showed small subdural hematoma [4 mm] on the left para falacine area and a 2 cm area concerning for subdural hemorrhage on left posterior fossa. EDP consulted NSGY who recommended BP control and medicine admission.  Assessment and Plan:  Syncope PT OT on board and recommending home health Follow-up on orthostatic vitals Echocardiogram with results pending CT scan of the brain reviewed that did not show any worsening of hematoma size Neurologist have read EEG that did not show any epileptiform waves Patient's pacemaker to be interrogated  SDH -NSGY consulted; apprec eval/recs -CCM consulted; apprec eval/recs Follow-up CT scan of the brain in 6 hours reviewed that did not show any worsening of hematoma -SBP <140-150   Afib s/p PPM -Cards consulted for PPM interrogation; apprec eval/recsPTA metoprolol  XL 50mg  daily -HOLD pta Eliquis  per SDH; resume per stable CTH and NSGY recs   HTN -PTA metoprolol , lasix , and spironolactone ;    OSA -PTA CPAP at bedtime     Advance Care Planning:   Code Status: Limited: Do not attempt resuscitation (DNR) -DNR-LIMITED -Do Not Intubate/DNI      Consults: NSGY and CCM   Family Communication: Husband    Subjective:  Patient seen and examined at bedside this morning Admits to some right-sided neck pain but no headache CT scan reviewed that shows stable hematoma Denies nausea vomiting abdominal pain or chest pain  Physical Exam: General: Alert, oriented x3, resting comfortably in no acute distress Respiratory: Lungs clear to auscultation bilaterally with normal respiratory effort; no w/r/r Cardiovascular: Regular rate and rhythm w/o m/r/g   Vitals:   03/15/24 1155 03/15/24 1546 03/15/24 1548 03/15/24 1552  BP: (!) 150/70 (!) 164/59 (!) 139/106 (!) 169/87  Pulse: (!) 45 63 66 74  Resp:      Temp: 98.2 F (36.8 C) 98.3 F (36.8 C)    TempSrc:      SpO2: 98% 98% 98% 99%  Weight:      Height:        Data Reviewed: CT scan reviewed    Latest Ref Rng & Units 03/15/2024    3:42 AM 03/14/2024    8:55 AM 11/20/2023   11:58 AM  BMP  Glucose 70 - 99 mg/dL 891  869  884   BUN 8 - 23 mg/dL 13  11  16    Creatinine 0.44 - 1.00 mg/dL 9.04  9.12  9.08   Sodium 135 - 145 mmol/L 131  130  137   Potassium 3.5 - 5.1 mmol/L 4.3  5.4  4.7   Chloride 98 - 111 mmol/L 101  97  102   CO2 22 -  32 mmol/L 24  23  27    Calcium  8.9 - 10.3 mg/dL 9.5  9.9  89.6        Latest Ref Rng & Units 03/14/2024    8:55 AM 11/20/2023   11:58 AM 11/18/2023    5:36 PM  CBC  WBC 4.0 - 10.5 K/uL 9.6  12.3  13.5   Hemoglobin 12.0 - 15.0 g/dL 86.4  9.1  8.9   Hematocrit 36.0 - 46.0 % 41.5  29.3  28.8   Platelets 150 - 400 K/uL 267  454  441     Author: Drue ONEIDA Potter, MD 03/15/2024 5:51 PM  For on call review [email or web address].

## 2024-03-15 NOTE — Care Management Obs Status (Signed)
 MEDICARE OBSERVATION STATUS NOTIFICATION   Patient Details  Name: Brittany [name] MRN: [id number] Date of Birth: 1934/03/11   Medicare Observation Status Notification Given:    Verbally reviewed observation notice with Niels Shoe telephonically at [phone]     4 03/15/2024, 3:19 PM

## 2024-03-15 NOTE — Plan of Care (Signed)
 ?  Problem: Education: ?Goal: Knowledge of General Education information will improve ?Description: Including pain rating scale, medication(s)/side effects and non-pharmacologic comfort measures ?Outcome: Progressing ?  ?Problem: Health Behavior/Discharge Planning: ?Goal: Ability to manage health-related needs will improve ?Outcome: Progressing ?  ?Problem: Coping: ?Goal: Level of anxiety will decrease ?Outcome: Progressing ?  ?

## 2024-03-15 NOTE — Progress Notes (Signed)
  Echocardiogram 2D Echocardiogram has been performed.  Devora Ellouise SAUNDERS 03/15/2024, 3:08 PM

## 2024-03-15 NOTE — Plan of Care (Signed)
  Problem: Education: Goal: Knowledge of General Education information will improve Description: Including pain rating scale, medication(s)/side effects and non-pharmacologic comfort measures Outcome: Progressing   Problem: Clinical Measurements: Goal: Ability to maintain clinical measurements within normal limits will improve Outcome: Progressing   Problem: Nutrition: Goal: Adequate nutrition will be maintained Outcome: Progressing   Problem: Pain Managment: Goal: General experience of comfort will improve and/or be controlled Outcome: Progressing   Problem: Safety: Goal: Ability to remain free from injury will improve Outcome: Progressing   Problem: Skin Integrity: Goal: Risk for impaired skin integrity will decrease Outcome: Progressing

## 2024-03-16 DIAGNOSIS — S065XAA Traumatic subdural hemorrhage with loss of consciousness status unknown, initial encounter: Secondary | ICD-10-CM | POA: Diagnosis not present

## 2024-03-16 LAB — BASIC METABOLIC PANEL WITH GFR
Anion gap: 7 (ref 5–15)
BUN: 12 mg/dL (ref 8–23)
CO2: 23 mmol/L (ref 22–32)
Calcium: 9.7 mg/dL (ref 8.9–10.3)
Chloride: 101 mmol/L (ref 98–111)
Creatinine, Ser: 0.84 mg/dL (ref 0.44–1.00)
GFR, Estimated: >60 mL/min (ref >60)
Glucose, Bld: 119 mg/dL — ABNORMAL HIGH (ref 70–99)
Potassium: 4.1 mmol/L (ref 3.5–5.1)
Sodium: 131 mmol/L — ABNORMAL LOW (ref 135–145)

## 2024-03-16 LAB — CBC
HCT: 40.1 % (ref 36.0–46.0)
Hemoglobin: 13.3 g/dL (ref 12.0–15.0)
MCH: 30.3 pg (ref 26.0–34.0)
MCHC: 33.2 g/dL (ref 30.0–36.0)
MCV: 91.3 fL (ref 80.0–100.0)
Platelets: 236 K/uL (ref 150–400)
RBC: 4.39 MIL/uL (ref 3.87–5.11)
RDW: 15.9 % — ABNORMAL HIGH (ref 11.5–15.5)
WBC: 9.5 K/uL (ref 4.0–10.5)
nRBC: 0 % (ref 0.0–0.2)

## 2024-03-16 MED ORDER — KETOROLAC TROMETHAMINE 15 MG/ML IJ SOLN
15.0000 mg | Freq: Once | INTRAMUSCULAR | Status: AC
  Administered 2024-03-16: 15 mg via INTRAVENOUS
  Filled 2024-03-16: qty 1

## 2024-03-16 NOTE — Discharge Summary (Addendum)
 Physician Discharge Summary   Patient: Brittany [name] MRN: [id number] DOB: 01/14/1934  Admit date:     03/14/2024  Discharge date: 03/16/24  Discharge Physician: Drue ONEIDA Potter   PCP: Sun, Vyvyan, MD   Recommendations at discharge:  Follow-up with primary care physician Because of your recent intracranial bleed please stay away from the Eliquis  until you follow-up with your cardiologist who may restart  this at a later date based on risks and benefit profile evaluation  Discharge Diagnoses:  Syncope Subdural hematoma Afib s/p PPm HTN OSA  Resolved Problems:   * No resolved hospital problems. [facility] Course:  From HPI Brittany [name] is a 89 y.o. female with medical history significant of A-fib on Eliquis , hypertension, OSA on CPAP and bilateral TKR with recent right TKR who was found down in her walk-in closet and is admitted for syncope.   Pt unable to provide medical history. From what I can gather per chart review and conversation with EDP, pt woke up, walked to her closet and her husband heard a big bang/thud after which he found his wife on the floor of her closet after which he activated EMS.   In the ED, pt was hypertensive and initially required nicardipine  gtt that was d/c. Labs notable for Na 130, K 5.4, and troponin 35-->31. CT head showed small subdural hematoma [4 mm] on the left para falacine area and a 2 cm area concerning for subdural hemorrhage on left posterior fossa. EDP consulted NSGY who recommended BP control and medicine admission.    Patient was seen by intensivist as well as neurosurgeon Repeat CT scan showed stable bleed. Patient was counseled about the benefits and risks of Eliquis  and she agreed to stay off Eliquis  for some time until she follows up with her cardiologist. Echocardiogram did not show any reduced EF.  Orthostatic blood pressure was normal and patient was seen by PT OT with recommendation for home health.   Consultants: As  mentioned above Procedures performed: None Disposition: Home health Diet recommendation:  Cardiac diet DISCHARGE MEDICATION: Allergies as of 03/16/2024       Reactions   Citalopram  Hydrobromide Other (See Comments)   QT prolongation with Tikosyn    Adhesive [tape] Swelling, Rash   Bacitracin-polymyxin B Rash   Benzalkonium Chloride Rash   Pt was not aware of this allergy   Neosporin [neomycin-polymyxin-gramicidin] Swelling, Rash        Medication List     STOP taking these medications    apixaban  5 MG Tabs tablet Commonly known as: ELIQUIS        TAKE these medications    acetaminophen  500 MG tablet Commonly known as: TYLENOL  Take 500 mg by mouth 2 (two) times daily.   alendronate  70 MG tablet Commonly known as: FOSAMAX  Take 1 tablet (70 mg total) by mouth every 7 (seven) days. Take with a full glass of water  on an empty stomach. What changed: when to take this   atorvastatin  40 MG tablet Commonly known as: LIPITOR Take 40 mg by mouth in the morning.   B-complex with vitamin C tablet Take 1 tablet by mouth in the morning.   dapagliflozin  propanediol 10 MG Tabs tablet Commonly known as: FARXIGA  Take 1 tablet (10 mg total) by mouth daily before breakfast.   digoxin  0.125 MG tablet Commonly known as: LANOXIN  Take 1 tablet (0.125 mg total) by mouth daily.   furosemide  40 MG tablet Commonly known as: LASIX  Take 2 tablets (80 mg total) by mouth  every other day. TAKE 80 MG ALTERNATING 40 MG EVERY OTHER DAY What changed: how much to take   HYDROcodone -acetaminophen  5-325 MG tablet Commonly known as: NORCO/VICODIN Take 1 tablet by mouth every 6 (six) hours as needed for moderate pain (pain score 4-6) or severe pain (pain score 7-10) (post op pain).   levothyroxine  88 MCG tablet Commonly known as: SYNTHROID  Take 88 mcg by mouth daily before breakfast.   Lutein  20 MG Tabs Take 20 mg by mouth daily at 12 noon.   magnesium  oxide 400 MG tablet Commonly known  as: MAG-OX Take 1 tablet (400 mg total) by mouth daily.   Melatonin 10 MG Tabs Take 10-40 mg by mouth at bedtime as needed (sleep).   metoprolol  succinate 50 MG 24 hr tablet Commonly known as: TOPROL -XL 1 tablet Orally twice a day   multivitamin with minerals Tabs tablet Take 1 tablet by mouth daily at 12 noon.   PRESERVISION AREDS 2 PO Take 1 capsule by mouth 2 (two) times daily with a meal.   sertraline  50 MG tablet Commonly known as: Zoloft  Take 1 tablet (50 mg total) by mouth daily.   spironolactone  25 MG tablet Commonly known as: ALDACTONE  Take 1 tablet (25 mg total) by mouth daily. What changed: when to take this   Vitamin D3 125 MCG (5000 UT) Tabs Take 5,000 Units by mouth daily with breakfast.        Discharge Exam: Filed Weights   03/14/24 0906  Weight: 93 kg   General: Alert, oriented x3, resting comfortably in no acute distress Respiratory: Lungs clear to auscultation bilaterally with normal respiratory effort; no w/r/r Cardiovascular: Regular rate and rhythm w/o m/r/g    Condition at discharge: good  The results of significant diagnostics from this hospitalization (including imaging, microbiology, ancillary and laboratory) are listed below for reference.   Imaging Studies: ECHOCARDIOGRAM COMPLETE BUBBLE STUDY Result Date: 03/15/2024    ECHOCARDIOGRAM REPORT   Patient Name:   Brittany [name] [facility] Date of Exam: 03/15/2024 Medical Rec #:  [id number]       Height:       68.0 in Accession #:    [id number]      Weight:       205.0 lb Date of Birth:  June 19, 1934       BSA:          2.065 m Patient Age:    89 years        BP:           159/67 mmHg Patient Gender: F               HR:           53 bpm. Exam Location:  Inpatient Procedure: 2D Echo, Cardiac Doppler and Color Doppler (Both Spectral and Color            Flow Doppler were utilized during procedure). Indications:    R55 Syncope  History:        Patient has prior history of Echocardiogram examinations, most                  recent 12/02/2022. Abnormal ECG and Pacemaker, Arrythmias:Atrial                 Fibrillation, Signs/Symptoms:Dyspnea and Shortness of Breath;                 Risk Factors:Hypertension and Dyslipidemia.  Sonographer:    Ellouise Mose RDCS Referring Phys: [id number] MARSHA ADA  Sonographer Comments: Technically difficult study due to poor echo windows and patient is obese. Image acquisition challenging due to patient body habitus. IMPRESSIONS  1. Left ventricular ejection fraction, by estimation, is 55 to 60%. The left ventricle has normal function. The left ventricle has no regional wall motion abnormalities. There is mild concentric left ventricular hypertrophy. Left ventricular diastolic function could not be evaluated.  2. Right ventricular systolic function is mildly reduced. The right ventricular size is normal. The estimated right ventricular systolic pressure is 40.7 mmHg.  3. Right atrial size was mildly dilated.  4. Moderate pleural effusion in the left lateral region.  5. The mitral valve is normal in structure. Mild mitral valve regurgitation. No evidence of mitral stenosis.  6. Tricuspid valve regurgitation is moderate.  7. The aortic valve is tricuspid. Aortic valve regurgitation is mild. Aortic valve sclerosis/calcification is present, without any evidence of aortic stenosis.  8. Aortic dilatation noted. There is mild dilatation of the ascending aorta, measuring 40 mm.  9. The inferior vena cava is dilated in size with >50% respiratory variability, suggesting right atrial pressure of 8 mmHg. 10. Agitated saline contrast bubble study was negative, with no evidence of any interatrial shunt. FINDINGS  Left Ventricle: Left ventricular ejection fraction, by estimation, is 55 to 60%. The left ventricle has normal function. The left ventricle has no regional wall motion abnormalities. The left ventricular internal cavity size was normal in size. There is  mild concentric left ventricular hypertrophy. Left  ventricular diastolic function could not be evaluated. Right Ventricle: The right ventricular size is normal. No increase in right ventricular wall thickness. Right ventricular systolic function is mildly reduced. The tricuspid regurgitant velocity is 2.86 m/s, and with an assumed right atrial pressure of 8 mmHg, the estimated right ventricular systolic pressure is 40.7 mmHg. Left Atrium: Left atrial size was normal in size. Right Atrium: Right atrial size was mildly dilated. Pericardium: There is no evidence of pericardial effusion. Mitral Valve: The mitral valve is normal in structure. Mild mitral valve regurgitation. No evidence of mitral valve stenosis. Tricuspid Valve: The tricuspid valve is normal in structure. Tricuspid valve regurgitation is moderate . No evidence of tricuspid stenosis. Aortic Valve: The aortic valve is tricuspid. Aortic valve regurgitation is mild. Aortic valve sclerosis/calcification is present, without any evidence of aortic stenosis. Pulmonic Valve: The pulmonic valve was normal in structure. Pulmonic valve regurgitation is not visualized. No evidence of pulmonic stenosis. Aorta: Aortic dilatation noted. There is mild dilatation of the ascending aorta, measuring 40 mm. Venous: The inferior vena cava is dilated in size with greater than 50% respiratory variability, suggesting right atrial pressure of 8 mmHg. IAS/Shunts: No atrial level shunt detected by color flow Doppler. Agitated saline contrast was given intravenously to evaluate for intracardiac shunting. Agitated saline contrast bubble study was negative, with no evidence of any interatrial shunt. Additional Comments: A device lead is visualized. There is a moderate pleural effusion in the left lateral region.  LEFT VENTRICLE PLAX 2D LVIDd:         4.90 cm LVIDs:         3.30 cm LV PW:         1.30 cm LV IVS:        1.20 cm LVOT diam:     2.30 cm LV SV:         107 LV SV Index:   52 LVOT Area:     4.15 cm  LV Volumes (MOD) LV vol d,  MOD  A2C: 101.0 ml LV vol d, MOD A4C: 115.0 ml LV vol s, MOD A2C: 40.7 ml LV vol s, MOD A4C: 40.9 ml LV SV MOD A2C:     60.3 ml LV SV MOD A4C:     115.0 ml LV SV MOD BP:      70.2 ml RIGHT VENTRICLE            IVC RV S prime:     8.38 cm/s  IVC diam: 2.30 cm TAPSE (M-mode): 1.3 cm LEFT ATRIUM             Index        RIGHT ATRIUM           Index LA diam:        5.70 cm 2.76 cm/m   RA Area:     22.10 cm LA Vol (A2C):   69.3 ml 33.55 ml/m  RA Volume:   65.20 ml  31.57 ml/m LA Vol (A4C):   61.3 ml 29.68 ml/m LA Biplane Vol: 67.3 ml 32.58 ml/m  AORTIC VALVE LVOT Vmax:   123.00 cm/s LVOT Vmean:  79.100 cm/s LVOT VTI:    0.258 m  AORTA Ao Root diam: 3.20 cm Ao Asc diam:  3.95 cm MITRAL VALVE                TRICUSPID VALVE MV Area (PHT): 4.31 cm     TR Peak grad:   32.7 mmHg MV Decel Time: 176 msec     TR Vmax:        286.00 cm/s MV E velocity: 100.00 cm/s                             SHUNTS                             Systemic VTI:  0.26 m                             Systemic Diam: 2.30 cm Wilbert Bihari MD Electronically signed by Wilbert Bihari MD Signature Date/Time: 03/15/2024/4:33:00 PM    Final    EEG adult Result Date: 03/15/2024 Shelton Arlin KIDD, MD     03/15/2024 11:31 AM Patient Name: TAMANI DURNEY MRN: [id number] Epilepsy Attending: Arlin KIDD Shelton Referring Physician/Provider: Georgina Basket, MD Date: 03/15/2024 Duration: 22.52 mins Patient history: 89yo F with syncope. EEG to evaluate for seizure Level of alertness: Awake AEDs during EEG study: None Technical aspects: This EEG study was done with scalp electrodes positioned according to the 10-20 International system of electrode placement. Electrical activity was reviewed with band pass filter of 1-70Hz , sensitivity of 7 uV/mm, display speed of 28mm/sec with a 60Hz  notched filter applied as appropriate. EEG data were recorded continuously and digitally stored.  Video monitoring was available and reviewed as appropriate. Description: The posterior dominant  rhythm consists of 9 Hz activity of moderate voltage (25-35 uV) seen predominantly in posterior head regions, symmetric and reactive to eye opening and eye closing. EEG showed intermittent generalized 3 to 5 Hz theta-delta slowing. Hyperventilation and photic stimulation were not performed.   ABNORMALITY - Intermittent slow, generalized IMPRESSION: This study is suggestive of mild diffuse encephalopathy. No seizures or epileptiform discharges were seen throughout the recording. Priyanka KIDD Shelton   CT HEAD WO CONTRAST ( ) Result Date: 03/14/2024 EXAM: CT HEAD WITHOUT  CONTRAST 03/14/2024 05:04:50 PM TECHNIQUE: CT of the head was performed without the administration of intravenous contrast. Automated exposure control, iterative reconstruction, and/or weight based adjustment of the mA/kV was utilized to reduce the radiation dose to as low as reasonably achievable. COMPARISON: 03/14/2024 09:11 AM CLINICAL HISTORY: Subdural hematoma. FINDINGS: BRAIN AND VENTRICLES: No acute hemorrhage. Gray-white differentiation is preserved. No hydrocephalus. No extra-axial collection. No mass effect or midline shift. Unchanged appearance of extra-axial hematoma at the left cerebellopontine angle. 4 mm parafalcine subdural hematoma is also unchanged. Moderate chronic ischemic white matter changes. Generalized volume loss. ORBITS: No acute abnormality. SINUSES: No acute abnormality. SOFT TISSUES AND SKULL: No acute soft tissue abnormality. No skull fracture. IMPRESSION: 1. Unchanged appearance of extra-axial hematoma at the left cerebellopontine angle and 4 mm parafalcine subdural hematoma. 2. Moderate chronic ischemic white matter changes. 3. Generalized volume loss. Electronically signed by: Franky Stanford MD 03/14/2024 05:15 PM EDT RP Workstation: [id number]   CT Head Wo Contrast Addendum Date: 03/14/2024 ADDENDUM REPORT: 03/14/2024 09:53 ADDENDUM: Study discussed by telephone with Dr. CARON SALT on 03/14/2024 at 0937 hours. And  he advises the patient is on Eliquis . I suspect acute subarachnoid hemorrhage in the left lower posterior fossa. Electronically Signed   By: VEAR Hurst M.D.   On: 03/14/2024 09:53   Result Date: 03/14/2024 CLINICAL DATA:  89 year old female status post fall in closet. Struck head. Hypertensive, 210 systolic. EXAM: CT HEAD WITHOUT CONTRAST TECHNIQUE: Contiguous axial images were obtained from the base of the skull through the vertex without intravenous contrast. RADIATION DOSE REDUCTION: This exam was performed according to the departmental dose-optimization program which includes automated exposure control, adjustment of the mA and/or kV according to patient size and/or use of iterative reconstruction technique. COMPARISON:  Head CT 02/04/2023. FINDINGS: Brain: New since last year hyperdense extra-axial space-occupying mass at the left ventral posterior fossa, series 3, image 8). This involves the left pre medullary cistern. This involves an area of about 2 cm (sagittal image 37). Not visible last year. Additionally, no regional mass effect. And superimposed supratentorial hyperdense para falcine subdural hematoma, up to 3 mm in thickness. No IVH or ventriculomegaly. Stable cerebral volume since last year. Advanced chronic cerebral white matter disease. Chronic heterogeneity in the deep gray nuclei compatible with small vessel disease. No cortically based acute infarct identified. Vascular: Calcified atherosclerosis at the skull base. No suspicious intracranial vascular hyperdensity. Skull: Stable and intact. Sinuses/Orbits: Resolved paranasal sinus mucosal thickening last year. Visualized paranasal sinuses and mastoids are well aerated. Other: Chronic right forehead scalp soft tissue scarring. No acute orbit or scalp soft tissue injury identified. Traumatic Brain Injury Risk Stratification Skull Fracture: No - Low/mBIG 1 Subdural Hematoma (SDH): <8mm - mBIG 1 Subarachnoid Hemorrhage [facility]): Probable left posterior  fossa subarachnoid hemorrhage. Epidural Hematoma (EDH): No - Low/mBIG 1 Cerebral contusion, intra-axial, intraparenchymal Hemorrhage (IPH): No Intraventricular Hemorrhage (IVH): No - Low/mBIG 1 Midline Shift > 1mm or Edema/effacement of sulci/vents: No - Low/mBIG 1 ---------------------------------------------------- IMPRESSION: 1. Less than 4 mm para falcine subdural hematoma. And additionally, 2 cm area of hyperdense hemorrhage versus mass in the left lower posterior fossa, left pre medullary cistern. 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 2. No intracranial mass effect or midline shift. 3. Stable non contrast CT appearance of chronic small vessel disease. Electronically Signed: By: VEAR Hurst M.D. On: 03/14/2024 09:33   DG Chest Portable 1 View Result Date: 03/14/2024 CLINICAL DATA:  89 year old female status post fall in closet. Struck head. Hypertensive, 210 systolic. On Eliquis . EXAM: PORTABLE CHEST 1 VIEW COMPARISON:  Portable chest 11/20/2023 and earlier. FINDINGS: Portable AP semi upright view at 0854 hours. Chronic cardiomegaly, mild elevated left hemidiaphragm, left chest cardiac pacemaker. Stable lung volumes, mediastinal contours. Visualized tracheal air column is within normal limits. Allowing for portable technique the lungs are clear. No pneumothorax or pleural effusion. Advanced bilateral shoulder degeneration. No acute osseous abnormality identified. Paucity of bowel gas. IMPRESSION: 1. No acute cardiopulmonary abnormality or acute traumatic injury identified. 2. Chronic cardiomegaly, pacemaker. Electronically Signed   By: VEAR Hurst M.D.   On: 03/14/2024 09:52   DG Pelvis 1-2 Views Result Date: 03/14/2024 CLINICAL DATA:  89 year old female status post fall in closet. Struck head. Hypertensive, 210 systolic. EXAM: PELVIS - 1-2 VIEW COMPARISON:  CT abdomen 11/25/2023. Intraoperative right hip images 12423.  FINDINGS: Portable AP supine view at 0856 hours. Bipolar type right hip arthroplasty partially visible, grossly stable. Bone mineralization is within normal limits for age. Left femoral head degenerated, normally located. No acute pelvis fracture identified. Grossly intact proximal femurs. Nonobstructed bowel-gas pattern. Partially visible advanced lumbar disc and endplate degeneration. IMPRESSION: No acute fracture or dislocation identified about the pelvis. Electronically Signed   By: VEAR Hurst M.D.   On: 03/14/2024 09:41   CT Cervical Spine Wo Contrast Result Date: 03/14/2024 CLINICAL DATA:  89 year old female status post fall in closet. Struck head. Hypertensive, 210 systolic. EXAM: CT CERVICAL SPINE WITHOUT CONTRAST TECHNIQUE: Multidetector CT imaging of the cervical spine was performed without intravenous contrast. Multiplanar CT image reconstructions were also generated. RADIATION DOSE REDUCTION: This exam was performed according to the departmental dose-optimization program which includes automated exposure control, adjustment of the mA and/or kV according to patient size and/or use of iterative reconstruction technique. COMPARISON:  Head CT today.  Cervical spine CT 12/01/2022. FINDINGS: Alignment: Stable straightening of cervical lordosis. Chronic cirrhosis or assay ankylosis. Bilateral posterior element alignment is within normal limits. Skull base and vertebrae: Visualized skull base is intact. No atlanto-occipital dissociation. C1 and C2 are chronically degenerated but appear intact and aligned. Diffuse idiopathic skeletal hyperostosis (DISH). No acute osseous abnormality identified. Soft tissues and spinal canal: No prevertebral fluid or swelling. No visible canal hematoma. However, globular hyperdense hemorrhage again suspected in the left posterior fossa just above the cisterna magna (series 6, image 19). This is contiguous with the foramen of Luschka. Severe calcified cervical carotid  atherosclerosis. Disc levels: Advanced cervical spine degeneration with Diffuse idiopathic skeletal hyperostosis (DISH). Chronic cervicothoracic junction ankylosis. Bulky endplate osteophytes elsewhere. Appearance is stable from the CT last year. Upper chest: Visible upper thoracic levels appear intact. Chronic left chest pacemaker leads. Negative lung apices. IMPRESSION: 1. No acute traumatic injury identified in the cervical spine. 2. Suspected acute subarachnoid space hemorrhage redemonstrated just above the left cisterna magna, somewhat contiguous with the foramen of Luschka. See Head CT reported separately. 3. Advanced chronic cervical spine degeneration with Diffuse idiopathic skeletal hyperostosis (DISH) appears stable from last year. Electronically Signed   By: VEAR Hurst M.D.   On: 03/14/2024 09:36   CUP PACEART REMOTE DEVICE CHECK Result Date: 03/11/2024 PPM scheduled remote reviewed. Normal device function.  Presenting rhythm: AF-VP   (Known chronic AF. On OAC per chart) Next remote 91 days. AB, CVRS   Microbiology: Results for orders placed or performed during the hospital encounter of 03/14/24  Culture, blood (Routine X 2) w Reflex to ID Panel  Status: None (Preliminary result)   Collection Time: 03/14/24  4:24 PM   Specimen: BLOOD  Result Value Ref Range Status   Specimen Description BLOOD BLOOD LEFT ARM  Final   Special Requests   Final    BOTTLES DRAWN AEROBIC AND ANAEROBIC Blood Culture adequate volume   Culture   Final    NO GROWTH 2 DAYS Performed at [facility], [address], [place], [place] [address]    Report Status PENDING  Incomplete  Culture, blood (Routine X 2) w Reflex to ID Panel     Status: None (Preliminary result)   Collection Time: 03/14/24  4:37 PM   Specimen: BLOOD  Result Value Ref Range Status   Specimen Description BLOOD BLOOD RIGHT ARM  Final   Special Requests   Final    BOTTLES DRAWN AEROBIC AND ANAEROBIC Blood Culture adequate volume    Culture   Final    NO GROWTH 2 DAYS Performed at [facility], [address], [place], [place] [address]    Report Status PENDING  Incomplete    Labs: CBC: Recent Labs  Lab 03/14/24 0855 03/16/24 0358  WBC 9.6 9.5  HGB 13.5 13.3  HCT 41.5 40.1  MCV 92.6 91.3  PLT 267 236   Basic Metabolic Panel: Recent Labs  Lab 03/14/24 0855 03/15/24 0342 03/16/24 0358  NA 130* 131* 131*  K 5.4* 4.3 4.1  CL 97* 101 101  CO2 23 24 23   GLUCOSE 130* 108* 119*  BUN 11 13 12   CREATININE 0.87 0.95 0.84  CALCIUM  9.9 9.5 9.7   Liver Function Tests: Recent Labs  Lab 03/14/24 0855  AST 30  ALT 24  ALKPHOS 83  BILITOT 1.2  PROT 6.6  ALBUMIN 3.8   CBG: Recent Labs  Lab 03/14/24 0902  GLUCAP 133*    Discharge time spent:  38 minutes.  Signed: Drue ONEIDA Potter, MD [facility] 03/16/2024

## 2024-03-16 NOTE — TOC Transition Note (Signed)
 Transition of Care [facility]) - Discharge Note   Patient Details  Name: Brittany [name] MRN: [id number] Date of Birth: 10-11-1933  Transition of Care [facility] CM/SW Contact:  Rosaline JONELLE Joe, RN Phone Number: 03/16/2024, 11:27 AM   Clinical Narrative:    CM spoke with charge RN and patient is scheduled for discharge to home today with spouse.  Patient lives at [facility] with the spouse.  I sent Outpatient orders for PT and clinicals for PT/OT - faxed to [facility] rehabilitation services at [place] - fax # [phone].  Patient's spouse will provide transportation to home by car today.         Patient Goals and CMS Choice            Discharge Placement                       Discharge Plan and Services Additional resources added to the After Visit Summary for                                       Social Drivers of Health (SDOH) Interventions SDOH Screenings   Food Insecurity: No Food Insecurity (03/14/2024)  Housing: Low Risk  (03/14/2024)  Transportation Needs: No Transportation Needs (03/14/2024)  Utilities: Not At Risk (03/14/2024)  Social Connections: Moderately Integrated (03/14/2024)  Tobacco Use: Medium Risk (03/14/2024)     Readmission Risk Interventions     No data to display

## 2024-03-17 ENCOUNTER — Encounter (HOSPITAL_COMMUNITY): Payer: Self-pay | Admitting: Cardiology

## 2024-03-17 ENCOUNTER — Ambulatory Visit (HOSPITAL_BASED_OUTPATIENT_CLINIC_OR_DEPARTMENT_OTHER)
Admission: RE | Admit: 2024-03-17 | Discharge: 2024-03-17 | Disposition: A | Discharge status: Home / Self Care | Source: Ambulatory Visit | Attending: Cardiology | Admitting: Cardiology

## 2024-03-17 ENCOUNTER — Ambulatory Visit: Payer: Self-pay | Admitting: Cardiovascular Disease

## 2024-03-17 VITALS — BP 118/50 | HR 77 | Wt 197.0 lb

## 2024-03-17 DIAGNOSIS — I6503 Occlusion and stenosis of bilateral vertebral arteries: Secondary | ICD-10-CM | POA: Diagnosis not present

## 2024-03-17 DIAGNOSIS — Z8673 Personal history of transient ischemic attack (TIA), and cerebral infarction without residual deficits: Secondary | ICD-10-CM | POA: Insufficient documentation

## 2024-03-17 DIAGNOSIS — J9601 Acute respiratory failure with hypoxia: Secondary | ICD-10-CM | POA: Diagnosis not present

## 2024-03-17 DIAGNOSIS — E785 Hyperlipidemia, unspecified: Secondary | ICD-10-CM | POA: Insufficient documentation

## 2024-03-17 DIAGNOSIS — Z515 Encounter for palliative care: Secondary | ICD-10-CM | POA: Diagnosis not present

## 2024-03-17 DIAGNOSIS — S065XAS Traumatic subdural hemorrhage with loss of consciousness status unknown, sequela: Secondary | ICD-10-CM | POA: Insufficient documentation

## 2024-03-17 DIAGNOSIS — S066XAA Traumatic subarachnoid hemorrhage with loss of consciousness status unknown, initial encounter: Secondary | ICD-10-CM | POA: Diagnosis not present

## 2024-03-17 DIAGNOSIS — Z66 Do not resuscitate: Secondary | ICD-10-CM | POA: Diagnosis not present

## 2024-03-17 DIAGNOSIS — I6782 Cerebral ischemia: Secondary | ICD-10-CM | POA: Diagnosis not present

## 2024-03-17 DIAGNOSIS — I672 Cerebral atherosclerosis: Secondary | ICD-10-CM | POA: Diagnosis not present

## 2024-03-17 DIAGNOSIS — I4821 Permanent atrial fibrillation: Secondary | ICD-10-CM | POA: Insufficient documentation

## 2024-03-17 DIAGNOSIS — Z1152 Encounter for screening for COVID-19: Secondary | ICD-10-CM | POA: Diagnosis not present

## 2024-03-17 DIAGNOSIS — G935 Compression of brain: Secondary | ICD-10-CM | POA: Diagnosis not present

## 2024-03-17 DIAGNOSIS — I619 Nontraumatic intracerebral hemorrhage, unspecified: Secondary | ICD-10-CM | POA: Diagnosis not present

## 2024-03-17 DIAGNOSIS — S065XAA Traumatic subdural hemorrhage with loss of consciousness status unknown, initial encounter: Secondary | ICD-10-CM | POA: Diagnosis not present

## 2024-03-17 DIAGNOSIS — I5032 Chronic diastolic (congestive) heart failure: Secondary | ICD-10-CM | POA: Diagnosis not present

## 2024-03-17 DIAGNOSIS — R7989 Other specified abnormal findings of blood chemistry: Secondary | ICD-10-CM | POA: Diagnosis not present

## 2024-03-17 DIAGNOSIS — W19XXXS Unspecified fall, sequela: Secondary | ICD-10-CM | POA: Insufficient documentation

## 2024-03-17 DIAGNOSIS — Z79899 Other long term (current) drug therapy: Secondary | ICD-10-CM | POA: Diagnosis not present

## 2024-03-17 DIAGNOSIS — I4891 Unspecified atrial fibrillation: Secondary | ICD-10-CM | POA: Diagnosis not present

## 2024-03-17 DIAGNOSIS — Z950 Presence of cardiac pacemaker: Secondary | ICD-10-CM | POA: Diagnosis not present

## 2024-03-17 DIAGNOSIS — G4733 Obstructive sleep apnea (adult) (pediatric): Secondary | ICD-10-CM | POA: Insufficient documentation

## 2024-03-17 DIAGNOSIS — R531 Weakness: Secondary | ICD-10-CM | POA: Diagnosis not present

## 2024-03-17 DIAGNOSIS — E039 Hypothyroidism, unspecified: Secondary | ICD-10-CM | POA: Insufficient documentation

## 2024-03-17 DIAGNOSIS — E669 Obesity, unspecified: Secondary | ICD-10-CM | POA: Diagnosis not present

## 2024-03-17 DIAGNOSIS — J690 Pneumonitis due to inhalation of food and vomit: Secondary | ICD-10-CM | POA: Diagnosis not present

## 2024-03-17 DIAGNOSIS — I110 Hypertensive heart disease with heart failure: Secondary | ICD-10-CM | POA: Diagnosis not present

## 2024-03-17 DIAGNOSIS — R29810 Facial weakness: Secondary | ICD-10-CM | POA: Diagnosis not present

## 2024-03-17 DIAGNOSIS — R918 Other nonspecific abnormal finding of lung field: Secondary | ICD-10-CM | POA: Diagnosis not present

## 2024-03-17 DIAGNOSIS — I739 Peripheral vascular disease, unspecified: Secondary | ICD-10-CM | POA: Diagnosis not present

## 2024-03-17 DIAGNOSIS — Z8249 Family history of ischemic heart disease and other diseases of the circulatory system: Secondary | ICD-10-CM | POA: Diagnosis not present

## 2024-03-17 DIAGNOSIS — I471 Supraventricular tachycardia, unspecified: Secondary | ICD-10-CM | POA: Insufficient documentation

## 2024-03-17 DIAGNOSIS — I161 Hypertensive emergency: Secondary | ICD-10-CM | POA: Diagnosis not present

## 2024-03-17 DIAGNOSIS — W19XXXA Unspecified fall, initial encounter: Secondary | ICD-10-CM | POA: Diagnosis not present

## 2024-03-17 DIAGNOSIS — I495 Sick sinus syndrome: Secondary | ICD-10-CM | POA: Diagnosis not present

## 2024-03-17 DIAGNOSIS — R296 Repeated falls: Secondary | ICD-10-CM | POA: Insufficient documentation

## 2024-03-17 DIAGNOSIS — R4781 Slurred speech: Secondary | ICD-10-CM | POA: Diagnosis not present

## 2024-03-17 DIAGNOSIS — Z7983 Long term (current) use of bisphosphonates: Secondary | ICD-10-CM | POA: Diagnosis not present

## 2024-03-17 DIAGNOSIS — E871 Hypo-osmolality and hyponatremia: Secondary | ICD-10-CM | POA: Diagnosis not present

## 2024-03-17 DIAGNOSIS — I609 Nontraumatic subarachnoid hemorrhage, unspecified: Secondary | ICD-10-CM | POA: Diagnosis not present

## 2024-03-17 DIAGNOSIS — I615 Nontraumatic intracerebral hemorrhage, intraventricular: Secondary | ICD-10-CM | POA: Diagnosis not present

## 2024-03-17 DIAGNOSIS — Z79890 Hormone replacement therapy (postmenopausal): Secondary | ICD-10-CM | POA: Diagnosis not present

## 2024-03-17 DIAGNOSIS — J9690 Respiratory failure, unspecified, unspecified whether with hypoxia or hypercapnia: Secondary | ICD-10-CM | POA: Diagnosis not present

## 2024-03-17 MED ORDER — FUROSEMIDE 40 MG PO TABS: 40.0000 mg | ORAL_TABLET | Freq: Every day | ORAL | 2 refills | Status: DC

## 2024-03-17 NOTE — Patient Instructions (Signed)
 STOP Digoxin    STOP Farxiga .  CHANGE Lasix  to 40 mg daily.  Your physician recommends that you schedule a follow-up appointment in: 3 months ( November) ** PLEASE CALL THE OFFICE IN OCTOBER TO ARRANGE YOUR FOLLOW UP APPOINTMENT.**  If you have any questions or concerns before your next appointment please send us  a message through [place] or call our office at [phone].    TO LEAVE A MESSAGE FOR THE NURSE SELECT OPTION 2, PLEASE LEAVE A MESSAGE INCLUDING: YOUR NAME DATE OF BIRTH CALL BACK NUMBER REASON FOR CALL**this is important as we prioritize the call backs  YOU WILL RECEIVE A CALL BACK THE SAME DAY AS LONG AS YOU CALL BEFORE 4:00 PM  At the [facility], you and your health needs are our priority. As part of our continuing mission to provide you with exceptional heart care, we have created designated Provider Care Teams. These Care Teams include your primary Cardiologist (physician) and Advanced Practice Providers (APPs- Physician Assistants and Nurse Practitioners) who all work together to provide you with the care you need, when you need it.   You may see any of the following providers on your designated Care Team at your next follow up: Dr Toribio Fuel Dr Ezra Shuck Dr. Ria Commander Dr. Morene Brownie Amy Lenetta, NP Caffie Shed, [place] [facility] [place], [place] Beckey Coe, NP [place] Lee, NP Ellouise Class, NP Tinnie Redman, PharmD Jaun Bash, PharmD   Please be sure to bring in all your medications bottles to every appointment.    Thank you for choosing [facility]

## 2024-03-17 NOTE — Progress Notes (Signed)
   ADVANCED HEART FAILURE FOLLOW UP CLINIC NOTE  Referring Physician: Sun, Vyvyan, MD  Primary Care: Sun, Vyvyan, MD Primary Cardiologist:  HPI: Brittany [name] is a 89 y.o. female with a PMH of stroke, SSSx w/PPM, Afib, ATach, HTN, HLD, hypothyroidism, OSA w/CPAP, suspected HFpEF who presents for initial visit for follow up of diastolic heart failure.     Patient has been previously followed by Dr. Fernande in EP for atrial arrhythmia as well as SSS. She has been having chonic shortness of breath with exertion for the past 2 years without significant volume symptoms and has had an extensive workup. Her workup has included a CPET, ischemic evaluation, as well as multiple attempts at rhythm control.   Amyloid workup negative.     SUBJECTIVE:  Patient recently hospitalized for fall complicated by subdural hematoma, she has been very weak since discharge. She is accompanied by her husband and son, a family medicine physician today. She is somewhat slurring her words and has noticed that she has had trouble drinking out of her left side of her mouth since prior to discharge. Has a mild facial droop as well. She has remained off blood thinners since the event. We discussed that given her multiple falls we would like to avoid at this time.     PMH, current medications, allergies, social history, and family history reviewed in epic.  PHYSICAL EXAM: Vitals:   03/17/24 1050  BP: (!) 118/50  Pulse: 77  SpO2: 96%   GENERAL: NAD, chronically ill  appearing PULM:  Normal work of breathing, CTAB CARDIAC:  JVP: flat         Normal rate with regular rhythm. No murmurs, rubs or gallops.  No edema. Warm and well perfused extremities. ABDOMEN: Soft, non-tender, non-distended. NEUROLOGIC: Patient is oriented x3, left sided facial droop, normal strength bilaterally, facial sensation intact, cranial nerves with no apparent defects on exam    DATA REVIEW  ECG: VVIR     ECHO: 11/2022: Normal  LVEF, diastolic function not interpretable due to afib 03/15/24: LVEF 55-60%, RV systolic function mildly reduced   CATH: None   10/11/22: CPX  Attending: Normal functional capacity when compared to age-matched subjects. When the measured peak VO2 is adjusted for her ideal body weight there is significant improvement suggesting the patient's obesity is likely contributing significantly to her exercise intolerance. The VE/VCO2 is markedly elevated and can be related to elevated filling pressures during exercise; however given the low PETCO2 suspect may be more related to end-exercise hyperventilation. If symptoms persist, can consider exercise testing with invasive hemodynamic monitoring.     ASSESSMENT & PLAN:  Chronic diastolic heart failure: NYHA Class IIIb symptoms, but multifactorial.  - Amyloid workup negative - Stop digoxin  - Stop farxiga  given recent UTI - Continue spironolactone  25mg  daily - Decrease lasix  to 40mg  daily - Continue metoprolol  50mg  daily  Falls and recent brain bleed: Very weak, unsteady today. Discussed that I would remain off blood thinners at this time. Given facial drop briefly discussed repeat imaging, but remainder of exam is reassuring at this time.  - Hold eliquis   Atrial fibrillation: Permanent, VVIR. High pacing burden. - Stop eliquis , not a watchman candidate currently - Nodal blockade as above - Yearly echos   SSS: Pacemaker in place. - Follows with EP   HTN: Controlled today.   Follow up in 3 months  Morene Brownie, MD Advanced Heart Failure Mechanical Circulatory Support 03/17/24

## 2024-03-18 ENCOUNTER — Emergency Department (HOSPITAL_COMMUNITY)

## 2024-03-18 ENCOUNTER — Inpatient Hospital Stay (HOSPITAL_COMMUNITY)
Admission: EM | Admit: 2024-03-18 | Discharge: 2024-03-22 | DRG: 082 | Disposition: E | Attending: Pulmonary Disease | Admitting: Pulmonary Disease

## 2024-03-18 ENCOUNTER — Encounter (HOSPITAL_COMMUNITY): Payer: Self-pay

## 2024-03-18 ENCOUNTER — Inpatient Hospital Stay (HOSPITAL_COMMUNITY)

## 2024-03-18 DIAGNOSIS — E669 Obesity, unspecified: Secondary | ICD-10-CM | POA: Diagnosis present

## 2024-03-18 DIAGNOSIS — G4733 Obstructive sleep apnea (adult) (pediatric): Secondary | ICD-10-CM | POA: Diagnosis present

## 2024-03-18 DIAGNOSIS — Z9841 Cataract extraction status, right eye: Secondary | ICD-10-CM

## 2024-03-18 DIAGNOSIS — R54 Age-related physical debility: Secondary | ICD-10-CM | POA: Diagnosis present

## 2024-03-18 DIAGNOSIS — G935 Compression of brain: Secondary | ICD-10-CM | POA: Diagnosis present

## 2024-03-18 DIAGNOSIS — Z96641 Presence of right artificial hip joint: Secondary | ICD-10-CM | POA: Diagnosis present

## 2024-03-18 DIAGNOSIS — Z8249 Family history of ischemic heart disease and other diseases of the circulatory system: Secondary | ICD-10-CM | POA: Diagnosis not present

## 2024-03-18 DIAGNOSIS — E039 Hypothyroidism, unspecified: Secondary | ICD-10-CM | POA: Diagnosis present

## 2024-03-18 DIAGNOSIS — Z888 Allergy status to other drugs, medicaments and biological substances status: Secondary | ICD-10-CM

## 2024-03-18 DIAGNOSIS — S065XAA Traumatic subdural hemorrhage with loss of consciousness status unknown, initial encounter: Secondary | ICD-10-CM | POA: Diagnosis present

## 2024-03-18 DIAGNOSIS — Z9842 Cataract extraction status, left eye: Secondary | ICD-10-CM

## 2024-03-18 DIAGNOSIS — Z66 Do not resuscitate: Secondary | ICD-10-CM | POA: Diagnosis present

## 2024-03-18 DIAGNOSIS — J690 Pneumonitis due to inhalation of food and vomit: Secondary | ICD-10-CM | POA: Diagnosis not present

## 2024-03-18 DIAGNOSIS — M810 Age-related osteoporosis without current pathological fracture: Secondary | ICD-10-CM | POA: Diagnosis present

## 2024-03-18 DIAGNOSIS — I495 Sick sinus syndrome: Secondary | ICD-10-CM | POA: Diagnosis present

## 2024-03-18 DIAGNOSIS — Z881 Allergy status to other antibiotic agents status: Secondary | ICD-10-CM

## 2024-03-18 DIAGNOSIS — W19XXXA Unspecified fall, initial encounter: Secondary | ICD-10-CM | POA: Diagnosis present

## 2024-03-18 DIAGNOSIS — Z7983 Long term (current) use of bisphosphonates: Secondary | ICD-10-CM | POA: Diagnosis not present

## 2024-03-18 DIAGNOSIS — I4891 Unspecified atrial fibrillation: Secondary | ICD-10-CM | POA: Diagnosis not present

## 2024-03-18 DIAGNOSIS — I739 Peripheral vascular disease, unspecified: Secondary | ICD-10-CM | POA: Diagnosis present

## 2024-03-18 DIAGNOSIS — I5032 Chronic diastolic (congestive) heart failure: Secondary | ICD-10-CM | POA: Diagnosis present

## 2024-03-18 DIAGNOSIS — Z1152 Encounter for screening for COVID-19: Secondary | ICD-10-CM

## 2024-03-18 DIAGNOSIS — I110 Hypertensive heart disease with heart failure: Secondary | ICD-10-CM | POA: Diagnosis present

## 2024-03-18 DIAGNOSIS — Z961 Presence of intraocular lens: Secondary | ICD-10-CM | POA: Diagnosis present

## 2024-03-18 DIAGNOSIS — Z96653 Presence of artificial knee joint, bilateral: Secondary | ICD-10-CM | POA: Diagnosis present

## 2024-03-18 DIAGNOSIS — I615 Nontraumatic intracerebral hemorrhage, intraventricular: Secondary | ICD-10-CM

## 2024-03-18 DIAGNOSIS — Z79899 Other long term (current) drug therapy: Secondary | ICD-10-CM

## 2024-03-18 DIAGNOSIS — R7989 Other specified abnormal findings of blood chemistry: Secondary | ICD-10-CM | POA: Diagnosis not present

## 2024-03-18 DIAGNOSIS — E785 Hyperlipidemia, unspecified: Secondary | ICD-10-CM | POA: Diagnosis present

## 2024-03-18 DIAGNOSIS — R29810 Facial weakness: Secondary | ICD-10-CM | POA: Diagnosis present

## 2024-03-18 DIAGNOSIS — R296 Repeated falls: Secondary | ICD-10-CM | POA: Diagnosis present

## 2024-03-18 DIAGNOSIS — Z8781 Personal history of (healed) traumatic fracture: Secondary | ICD-10-CM

## 2024-03-18 DIAGNOSIS — J9601 Acute respiratory failure with hypoxia: Secondary | ICD-10-CM | POA: Diagnosis not present

## 2024-03-18 DIAGNOSIS — Z87891 Personal history of nicotine dependence: Secondary | ICD-10-CM

## 2024-03-18 DIAGNOSIS — R4781 Slurred speech: Secondary | ICD-10-CM | POA: Diagnosis present

## 2024-03-18 DIAGNOSIS — S066XAA Traumatic subarachnoid hemorrhage with loss of consciousness status unknown, initial encounter: Secondary | ICD-10-CM | POA: Diagnosis present

## 2024-03-18 DIAGNOSIS — Z515 Encounter for palliative care: Secondary | ICD-10-CM

## 2024-03-18 DIAGNOSIS — I4821 Permanent atrial fibrillation: Secondary | ICD-10-CM | POA: Diagnosis present

## 2024-03-18 DIAGNOSIS — Z950 Presence of cardiac pacemaker: Secondary | ICD-10-CM

## 2024-03-18 DIAGNOSIS — Z800 Family history of malignant neoplasm of digestive organs: Secondary | ICD-10-CM

## 2024-03-18 DIAGNOSIS — E871 Hypo-osmolality and hyponatremia: Secondary | ICD-10-CM | POA: Diagnosis present

## 2024-03-18 DIAGNOSIS — I609 Nontraumatic subarachnoid hemorrhage, unspecified: Secondary | ICD-10-CM | POA: Diagnosis not present

## 2024-03-18 DIAGNOSIS — Z808 Family history of malignant neoplasm of other organs or systems: Secondary | ICD-10-CM

## 2024-03-18 DIAGNOSIS — Z825 Family history of asthma and other chronic lower respiratory diseases: Secondary | ICD-10-CM

## 2024-03-18 DIAGNOSIS — I161 Hypertensive emergency: Secondary | ICD-10-CM | POA: Diagnosis present

## 2024-03-18 DIAGNOSIS — Z9049 Acquired absence of other specified parts of digestive tract: Secondary | ICD-10-CM

## 2024-03-18 DIAGNOSIS — Z803 Family history of malignant neoplasm of breast: Secondary | ICD-10-CM

## 2024-03-18 DIAGNOSIS — I619 Nontraumatic intracerebral hemorrhage, unspecified: Principal | ICD-10-CM | POA: Diagnosis present

## 2024-03-18 DIAGNOSIS — Z860100 Personal history of colon polyps, unspecified: Secondary | ICD-10-CM

## 2024-03-18 DIAGNOSIS — Z823 Family history of stroke: Secondary | ICD-10-CM

## 2024-03-18 DIAGNOSIS — Z79890 Hormone replacement therapy (postmenopausal): Secondary | ICD-10-CM | POA: Diagnosis not present

## 2024-03-18 DIAGNOSIS — Z90710 Acquired absence of both cervix and uterus: Secondary | ICD-10-CM

## 2024-03-18 DIAGNOSIS — Z6830 Body mass index (BMI) 30.0-30.9, adult: Secondary | ICD-10-CM

## 2024-03-18 LAB — COMPREHENSIVE METABOLIC PANEL WITH GFR
ALT: 23 U/L (ref 0–44)
AST: 29 U/L (ref 15–41)
Albumin: 3.9 g/dL (ref 3.5–5.0)
Alkaline Phosphatase: 77 U/L (ref 38–126)
Anion gap: 10 (ref 5–15)
BUN: 10 mg/dL (ref 8–23)
CO2: 22 mmol/L (ref 22–32)
Calcium: 10.1 mg/dL (ref 8.9–10.3)
Chloride: 93 mmol/L — ABNORMAL LOW (ref 98–111)
Creatinine, Ser: 0.55 mg/dL (ref 0.44–1.00)
GFR, Estimated: >60 mL/min (ref >60)
Glucose, Bld: 153 mg/dL — ABNORMAL HIGH (ref 70–99)
Potassium: 4.5 mmol/L (ref 3.5–5.1)
Sodium: 125 mmol/L — ABNORMAL LOW (ref 135–145)
Total Bilirubin: 2.1 mg/dL — ABNORMAL HIGH (ref 0.0–1.2)
Total Protein: 7.2 g/dL (ref 6.5–8.1)

## 2024-03-18 LAB — OSMOLALITY, URINE: Osmolality, Ur: 538 mosm/kg (ref 300–900)

## 2024-03-18 LAB — CORTISOL: Cortisol, Plasma: 24.1 ug/dL

## 2024-03-18 LAB — CBC WITH DIFFERENTIAL/PLATELET
Abs Immature Granulocytes: 0.1 K/uL — ABNORMAL HIGH (ref 0.00–0.07)
Basophils Absolute: 0 K/uL (ref 0.0–0.1)
Basophils Relative: 0 %
Eosinophils Absolute: 0 K/uL (ref 0.0–0.5)
Eosinophils Relative: 0 %
HCT: 41.6 % (ref 36.0–46.0)
Hemoglobin: 14.2 g/dL (ref 12.0–15.0)
Immature Granulocytes: 1 %
Lymphocytes Relative: 5 %
Lymphs Abs: 0.7 K/uL (ref 0.7–4.0)
MCH: 30.3 pg (ref 26.0–34.0)
MCHC: 34.1 g/dL (ref 30.0–36.0)
MCV: 88.9 fL (ref 80.0–100.0)
Monocytes Absolute: 1.3 K/uL — ABNORMAL HIGH (ref 0.1–1.0)
Monocytes Relative: 10 %
Neutro Abs: 11.4 K/uL — ABNORMAL HIGH (ref 1.7–7.7)
Neutrophils Relative %: 84 %
Platelets: 271 K/uL (ref 150–400)
RBC: 4.68 MIL/uL (ref 3.87–5.11)
RDW: 15.3 % (ref 11.5–15.5)
WBC: 13.5 K/uL — ABNORMAL HIGH (ref 4.0–10.5)
nRBC: 0 % (ref 0.0–0.2)

## 2024-03-18 LAB — SODIUM, URINE, RANDOM: Sodium, Ur: <30 mmol/L

## 2024-03-18 LAB — URINALYSIS, W/ REFLEX TO CULTURE (INFECTION SUSPECTED)
Bilirubin Urine: NEGATIVE
Glucose, UA: 50 mg/dL — AB
Hgb urine dipstick: NEGATIVE
Ketones, ur: NEGATIVE mg/dL
Nitrite: NEGATIVE
Protein, ur: 30 mg/dL — AB
Specific Gravity, Urine: >1.046 — ABNORMAL HIGH (ref 1.005–1.030)
pH: 6 (ref 5.0–8.0)

## 2024-03-18 LAB — TSH: TSH: 1.312 u[IU]/mL (ref 0.350–4.500)

## 2024-03-18 LAB — RESP PANEL BY RT-PCR (RSV, FLU A&B, COVID)  RVPGX2
Influenza A by PCR: NEGATIVE
Influenza B by PCR: NEGATIVE
Resp Syncytial Virus by PCR: NEGATIVE
SARS Coronavirus 2 by RT PCR: NEGATIVE

## 2024-03-18 LAB — BRAIN NATRIURETIC PEPTIDE: B Natriuretic Peptide: 279.4 pg/mL — ABNORMAL HIGH (ref 0.0–100.0)

## 2024-03-18 LAB — TROPONIN I (HIGH SENSITIVITY)
Troponin I (High Sensitivity): 140 ng/L (ref <18)
Troponin I (High Sensitivity): 134 ng/L (ref <18)

## 2024-03-18 LAB — RAPID URINE DRUG SCREEN, HOSP PERFORMED
Amphetamines: NOT DETECTED
Barbiturates: NOT DETECTED
Benzodiazepines: NOT DETECTED
Cocaine: NOT DETECTED
Opiates: NOT DETECTED
Tetrahydrocannabinol: NOT DETECTED

## 2024-03-18 LAB — MRSA NEXT GEN BY PCR, NASAL: MRSA by PCR Next Gen: NOT DETECTED

## 2024-03-18 MED ORDER — DILTIAZEM HCL-DEXTROSE 125-5 MG/125ML-% IV SOLN (PREMIX)
5.0000 mg/h | INTRAVENOUS | Status: DC
  Administered 2024-03-18: 5 mg/h via INTRAVENOUS
  Filled 2024-03-18: qty 125

## 2024-03-18 MED ORDER — LABETALOL HCL 5 MG/ML IV SOLN
10.0000 mg | INTRAVENOUS | Status: DC | PRN
  Administered 2024-03-19: 10 mg via INTRAVENOUS
  Filled 2024-03-18: qty 4

## 2024-03-18 MED ORDER — ONDANSETRON 4 MG PO TBDP
4.0000 mg | ORAL_TABLET | Freq: Four times a day (QID) | ORAL | Status: DC | PRN
Start: 2024-03-18 — End: 2024-03-20

## 2024-03-18 MED ORDER — MELATONIN 5 MG PO TABS: 20.0000 mg | ORAL_TABLET | Freq: Every evening | ORAL | Status: DC | PRN

## 2024-03-18 MED ORDER — IOHEXOL 350 MG/ML SOLN
75.0000 mL | Freq: Once | INTRAVENOUS | Status: AC | PRN
  Administered 2024-03-18: 75 mL via INTRAVENOUS

## 2024-03-18 MED ORDER — ACETAMINOPHEN 650 MG RE SUPP: 650.0000 mg | RECTAL | Status: DC | PRN

## 2024-03-18 MED ORDER — PANTOPRAZOLE SODIUM 40 MG PO TBEC: 40.0000 mg | DELAYED_RELEASE_TABLET | Freq: Every day | ORAL | Status: DC

## 2024-03-18 MED ORDER — PANTOPRAZOLE SODIUM 40 MG IV SOLR
40.0000 mg | Freq: Every day | INTRAVENOUS | Status: DC
  Administered 2024-03-18 – 2024-03-19 (×2): 40 mg via INTRAVENOUS
  Filled 2024-03-18 (×2): qty 10

## 2024-03-18 MED ORDER — SODIUM CHLORIDE 0.9 % IV SOLN: INTRAVENOUS | Status: AC

## 2024-03-18 MED ORDER — ACETAMINOPHEN 160 MG/5ML PO SOLN: 650.0000 mg | ORAL | Status: DC | PRN

## 2024-03-18 MED ORDER — DOCUSATE SODIUM 100 MG PO CAPS: 100.0000 mg | ORAL_CAPSULE | Freq: Two times a day (BID) | ORAL | Status: DC

## 2024-03-18 MED ORDER — MORPHINE SULFATE (PF) 2 MG/ML IV SOLN: 2.0000 mg | INTRAVENOUS | Status: DC | PRN

## 2024-03-18 MED ORDER — ORAL CARE MOUTH RINSE
15.0000 mL | OROMUCOSAL | Status: DC
  Administered 2024-03-18 – 2024-03-19 (×5): 15 mL via OROMUCOSAL

## 2024-03-18 MED ORDER — ONDANSETRON HCL 4 MG/2ML IJ SOLN: 4.0000 mg | Freq: Four times a day (QID) | INTRAMUSCULAR | Status: DC | PRN

## 2024-03-18 MED ORDER — SERTRALINE HCL 50 MG PO TABS
50.0000 mg | ORAL_TABLET | Freq: Every day | ORAL | Status: DC
  Administered 2024-03-18: 50 mg via ORAL
  Filled 2024-03-18: qty 1

## 2024-03-18 MED ORDER — NIMODIPINE 30 MG PO CAPS: 60.0000 mg | ORAL_CAPSULE | ORAL | Status: DC

## 2024-03-18 MED ORDER — LEVOTHYROXINE SODIUM 88 MCG PO TABS
88.0000 ug | ORAL_TABLET | Freq: Every day | ORAL | Status: DC
  Filled 2024-03-18 (×2): qty 1

## 2024-03-18 MED ORDER — TRAMADOL HCL 50 MG PO TABS: 50.0000 mg | ORAL_TABLET | Freq: Four times a day (QID) | ORAL | Status: DC | PRN

## 2024-03-18 MED ORDER — ACETAMINOPHEN 325 MG PO TABS: 650.0000 mg | ORAL_TABLET | ORAL | Status: DC | PRN

## 2024-03-18 MED ORDER — CHLORHEXIDINE GLUCONATE CLOTH 2 % EX PADS
6.0000 | MEDICATED_PAD | Freq: Every day | CUTANEOUS | Status: DC
  Administered 2024-03-18 – 2024-03-19 (×2): 6 via TOPICAL

## 2024-03-18 MED ORDER — NIMODIPINE 6 MG/ML PO SOLN
60.0000 mg | ORAL | Status: DC
  Administered 2024-03-18: 60 mg
  Filled 2024-03-18: qty 10

## 2024-03-18 MED ORDER — ORAL CARE MOUTH RINSE: 15.0000 mL | OROMUCOSAL | Status: DC | PRN

## 2024-03-18 MED ORDER — CLEVIDIPINE BUTYRATE 0.5 MG/ML IV EMUL
0.0000 mg/h | INTRAVENOUS | Status: DC
  Administered 2024-03-18 (×2): 2 mg/h via INTRAVENOUS
  Administered 2024-03-19: 6 mg/h via INTRAVENOUS
  Administered 2024-03-19: 8 mg/h via INTRAVENOUS
  Administered 2024-03-19: 4 mg/h via INTRAVENOUS
  Filled 2024-03-18 (×5): qty 50
  Filled 2024-03-18: qty 100
  Filled 2024-03-18: qty 50

## 2024-03-18 MED ORDER — ACETAMINOPHEN 325 MG PO TABS
650.0000 mg | ORAL_TABLET | Freq: Once | ORAL | Status: DC
  Filled 2024-03-18: qty 2

## 2024-03-18 MED ORDER — DILTIAZEM HCL 25 MG/5ML IV SOLN
15.0000 mg | Freq: Once | INTRAVENOUS | Status: AC
  Administered 2024-03-18: 15 mg via INTRAVENOUS
  Filled 2024-03-18: qty 5

## 2024-03-18 MED ORDER — STROKE: EARLY STAGES OF RECOVERY BOOK: Freq: Once | Status: AC

## 2024-03-18 MED ORDER — ACETAMINOPHEN 650 MG RE SUPP
650.0000 mg | Freq: Once | RECTAL | Status: AC
  Administered 2024-03-18: 650 mg via RECTAL
  Filled 2024-03-18: qty 1

## 2024-03-18 MED ORDER — DILTIAZEM LOAD VIA INFUSION
10.0000 mg | Freq: Once | INTRAVENOUS | Status: DC
  Filled 2024-03-18: qty 10

## 2024-03-18 MED ORDER — METOPROLOL TARTRATE 50 MG PO TABS: 50.0000 mg | ORAL_TABLET | Freq: Two times a day (BID) | ORAL | Status: DC

## 2024-03-18 NOTE — Evaluation (Signed)
 Clinical/Bedside Swallow Evaluation Patient Details  Name: Brittany [name] MRN: [id number] Date of Birth: [birth year]  Today's Date: 03/18/2024 Time: SLP Start Time (ACUTE ONLY): 1622 SLP Stop Time (ACUTE ONLY): 1635 SLP Time Calculation (min) (ACUTE ONLY): 13 min  Past Medical History:  Past Medical History:  Diagnosis Date   Ankle fracture 2016   Arthritis    Atrial fibrillation (HCC)    Depression    Dysrhythmia    Elevated parathyroid hormone    Fracture of orbital floor with routine healing    GERD (gastroesophageal reflux disease)    History of colon polyps    benign   Hypercalcemia    Hypertension    Hyponatremia    Hypothyroid    Macular degeneration    wet in the right and dry in the left   OSA on CPAP    Osteoporosis    Peripheral vascular disease (HCC)    Presence of permanent cardiac pacemaker    PSVT (paroxysmal supraventricular tachycardia) (HCC)    Rotator cuff tear    Sinus node dysfunction (HCC)    a. s/p MDT pacemaker   Stroke (HCC)    Urinary incontinence    Wrist fracture 2018   Past Surgical History:  Past Surgical History:  Procedure Laterality Date   BACK SURGERY     CARDIOVERSION N/A 05/02/2022   Procedure: CARDIOVERSION;  Surgeon: Mona Vinie BROCKS, MD;  Location: [facility];  Service: Cardiovascular;  Laterality: N/A;   CATARACT EXTRACTION     CATARACT EXTRACTION W/ INTRAOCULAR LENS  IMPLANT, BILATERAL Bilateral    COLONOSCOPY     ESOPHAGOGASTRODUODENOSCOPY     IR ANGIO INTRA EXTRACRAN SEL COM CAROTID INNOMINATE BILAT MOD SED  12/30/2017   IR ANGIO VERTEBRAL SEL VERTEBRAL BILAT MOD SED  12/30/2017   LAPAROSCOPIC CHOLECYSTECTOMY  08/11/1999   LUMBAR DISC SURGERY  02/19/2014   ORIF ANKLE FRACTURE Left 04/13/2015   Procedure: OPEN REDUCTION INTERNAL FIXATION (ORIF) LEFT ANKLE FRACTURE;  Surgeon: Glendia Cordella Hutchinson, MD;  Location: [facility] OR;  Service: Orthopedics;  Laterality: Left;   PACEMAKER PLACEMENT Right 07/22/2010   a. MDT dual chamber  PPM implanted by Dr Fernande    [facility] GENERATOR CHANGEOUT N/A 08/22/2022   Procedure: PPM GENERATOR HERMA;  Surgeon: Fernande Elspeth BROCKS, MD;  Location[facility] INVASIVE CV LAB;  Service: Cardiovascular;  Laterality: N/A;   SHOULDER ARTHROSCOPY W/ ROTATOR CUFF REPAIR Right 08/30/2014   WITH MINI-OPEN ROTATOR CUFF REPAIR AND SUBACROMIAL DECOMPRESSION   SHOULDER ARTHROSCOPY WITH ROTATOR CUFF REPAIR AND SUBACROMIAL DECOMPRESSION Right 08/30/2014   Procedure: SHOULDER ARTHROSCOPY WITH MINI-OPEN ROTATOR CUFF REPAIR AND SUBACROMIAL DECOMPRESSION;  Surgeon: Cordella Glendia Hutchinson, MD;  Location: [facility] OR;  Service: Orthopedics;  Laterality: Right;  RIGHT SHOULDER DIAGNOSTIC OPERATIVE ARTHROSCOPY, SUBACROMIAL DECOMPRESSION, MINI-OPEN ROTATOR CUFF REPAIR.   TOTAL HIP ARTHROPLASTY Right 08/14/2021   Procedure: TOTAL HIP ARTHROPLASTY ANTERIOR APPROACH;  Surgeon: Sheril Coy, MD;  Location: [facility] ORS;  Service: Orthopedics;  Laterality: Right;   TOTAL KNEE ARTHROPLASTY Left 02/13/2021   Procedure: LEFT TOTAL KNEE ARTHROPLASTY;  Surgeon: Sheril Coy, MD;  Location: [facility] ORS;  Service: Orthopedics;  Laterality: Left;   TOTAL KNEE ARTHROPLASTY Right 11/11/2023   Procedure: ARTHROPLASTY, KNEE, TOTAL;  Surgeon: Sheril Coy, MD;  Location: [facility] ORS;  Service: Orthopedics;  Laterality: Right;  RIGHT KNEE ARTHROPLASTY   VAGINAL HYSTERECTOMY  07/22/1968   WRIST FRACTURE SURGERY Left    HPI:  Patient is an 89 y.o. woman with medical history significant of A-fib on Eliquis , hypertension,  OSA and CPAP and bilateral TKR with recent right TKR who was found down in her walk-in closet and is admitted for syncope on 8/24. Patient was discharged on 8/26. On 8/28, she returned to the hospital presenting with worsening neurologic status, particularly lower extremity weakness with CT images with worsening subarachnoid hemorrhage, intraparenchymal hemorrhage, stable subdural hematoma. Patient failed the Brittany [name] with nursing and SLP swallow  evaluation ordered.    Assessment / Plan / Recommendation  Clinical Impression  Patient presents with clinical s/s of dysphagia as per this bedside swallow evaluation. Per chart review and discussion with patient, she had at least two EGD's with dilations in 2015 but these were completed in another state. Patient did indicate that she occasionally has globus sensation with solid foods but she has not needed any medical interventions for this and she drinks something carbonated like Sprite to help clear the bolus from her throat. She showed good awareness to her left sided weakness, telling SLP, it will spill out of my mouth on her weak side. Hyolaryngeal movement was decreased during swallows. SLP assessed her swallow function via PO's of thin liquids, puree solids, dysphagia 2 solids and dysphagia 3 solids. Patient did not exhibit immediate coughing with liquid or solid boluses but towards end of PO intake, patient started to exhibit delayed throat clearing followed by coughing. She commented it feels like it's building up and pointed to her throat. SLP is recommending an MBS next date to objectively assess patient's swallow function but also recommending to start a conservative diet of full liquids/thin consistency. SLP Visit Diagnosis: Dysarthria and anarthria (R47.1);Cognitive communication deficit (R41.841) (Cognition appeared intact at basic level, SLP will further assess higher level cognition.)    Aspiration Risk  Mild aspiration risk;Moderate aspiration risk    Diet Recommendation Thin liquid;Other (Comment) (full liquids)    Liquid Administration via: Straw Medication Administration: Other (Comment) (crushed with puree or liquid suspension) Supervision: Patient able to self feed;Intermittent supervision to cue for compensatory strategies Compensations: Minimize environmental distractions;Slow rate;Small sips/bites Postural Changes: Seated upright at 90 degrees;Remain upright for at  least 30 minutes after po intake    Other  Recommendations       Assistance Recommended at Discharge    Functional Status Assessment Patient has had a recent decline in their functional status and demonstrates the ability to make significant improvements in function in a reasonable and predictable amount of time.  Frequency and Duration min 2x/week          Prognosis        Swallow Study   General Date of Onset: 03/18/24 HPI: Patient is an 89 y.o. woman with medical history significant of A-fib on Eliquis , hypertension, OSA and CPAP and bilateral TKR with recent right TKR who was found down in her walk-in closet and is admitted for syncope on 8/24. Patient was discharged on 8/26. On 8/28, she returned to the hospital presenting with worsening neurologic status, particularly lower extremity weakness with CT images with worsening subarachnoid hemorrhage, intraparenchymal hemorrhage, stable subdural hematoma. Patient failed the Brittany [name] with nursing and SLP swallow evaluation ordered. Type of Study: Bedside Swallow Evaluation Previous Swallow Assessment: none found Diet Prior to this Study: NPO Temperature Spikes Noted: No Respiratory Status: Room air History of Recent Intubation: No Behavior/Cognition: Alert;Cooperative;Pleasant mood Oral Cavity Assessment: Dry Oral Care Completed by SLP: Yes Oral Cavity - Dentition: Adequate natural dentition Vision: Functional for self-feeding Self-Feeding Abilities: Able to feed self Patient Positioning: Upright in bed Baseline Vocal  Quality: Low vocal intensity;Normal Volitional Cough: Strong Volitional Swallow: Able to elicit    Oral/Motor/Sensory Function Overall Oral Motor/Sensory Function: Moderate impairment Facial ROM: Reduced left Facial Symmetry: Abnormal symmetry left Facial Strength: Reduced left Facial Sensation: Reduced left Lingual ROM: Within Functional Limits Lingual Symmetry: Within Functional Limits Lingual Strength:  Reduced Mandible: Within Functional Limits   Ice Chips Ice chips: Within functional limits   Thin Liquid Thin Liquid: Impaired Presentation: Straw Oral Phase Impairments: Reduced labial seal Oral Phase Functional Implications: Left anterior spillage Pharyngeal  Phase Impairments: Decreased hyoid-laryngeal movement    Nectar Thick     Honey Thick     Puree Puree: Within functional limits Presentation: Spoon   Solid     Solid: Impaired Oral Phase Impairments: Impaired mastication Oral Phase Functional Implications: Prolonged oral transit Pharyngeal Phase Impairments: Cough - Delayed      Brittany IVAR Blase, MA, CCC-SLP Speech Therapy

## 2024-03-18 NOTE — Evaluation (Signed)
 Speech Language Pathology Evaluation Patient Details Name: Brittany [name] MRN: [id number] DOB: [birth year] Today's Date: 03/18/2024 Time: 8364-8344 SLP Time Calculation (min) (ACUTE ONLY): 20 min  Problem List:  Patient Active Problem List   Diagnosis Date Noted   Subarachnoid bleed (HCC) 03/18/2024   Primary localized osteoarthritis of right knee 11/11/2023   Osteoarthritis of right knee 08/21/2023   Syncope 12/02/2022   Hypokalemia 12/02/2022   Fall at home, initial encounter 12/02/2022   SDH (subdural hematoma) (HCC) 12/02/2022   Subdural hematoma (HCC) 12/01/2022   Anemia 08/29/2021   DOE (dyspnea on exertion) 08/22/2021   Primary localized osteoarthritis of right hip 08/14/2021   Status post hip replacement, right 08/14/2021   Unilateral primary osteoarthritis, left knee 02/13/2021   Obesity (BMI 30.0-34.9) 06/21/2020   Sinus bradycardia 04/12/2020   Chronic heart failure with preserved ejection fraction (HFpEF) (HCC) 04/12/2020   Persistent atrial fibrillation (HCC) 03/22/2019   A-fib (HCC) 03/22/2019   Stroke (cerebrum) (HCC) 12/30/2017   Dizziness 06/06/2017   Overactive bladder 06/06/2017   Abnormal mammogram 05/01/2017   Hyperparathyroidism (HCC) 02/12/2017   Vitamin D  deficiency 08/14/2016   Osteoporosis 08/14/2016   Hyperlipidemia 08/14/2016   Atherosclerosis of coronary artery 01/02/2016   Hypercalcemia 03/29/2015   Multiple pulmonary nodules 12/30/2013   Lumbar spinal stenosis 12/22/2013   Arthropathy of facet joint 10/08/2013   Atrial fibrillation (HCC) 03/04/2013   Sinus node dysfunction (HCC) 03/04/2013   Pacemaker-[employer]tr[employer] 03/04/2013   OSA on CPAP 02/23/2013   Anxiety and depression 02/23/2013   Chronic gastritis 02/23/2013   Essential hypertension, benign 02/23/2013   Urinary incontinence 02/23/2013   Past Medical History:  Past Medical History:  Diagnosis Date   Ankle fracture 2016   Arthritis    Atrial fibrillation (HCC)    Depression     Dysrhythmia    Elevated parathyroid hormone    Fracture of orbital floor with routine healing    GERD (gastroesophageal reflux disease)    History of colon polyps    benign   Hypercalcemia    Hypertension    Hyponatremia    Hypothyroid    Macular degeneration    wet in the right and dry in the left   OSA on CPAP    Osteoporosis    Peripheral vascular disease (HCC)    Presence of permanent cardiac pacemaker    PSVT (paroxysmal supraventricular tachycardia) (HCC)    Rotator cuff tear    Sinus node dysfunction (HCC)    a. s/p MDT pacemaker   Stroke (HCC)    Urinary incontinence    Wrist fracture 2018   Past Surgical History:  Past Surgical History:  Procedure Laterality Date   BACK SURGERY     CARDIOVERSION N/A 05/02/2022   Procedure: CARDIOVERSION;  Surgeon: Mona Vinie BROCKS, MD;  Location: [facility];  Service: Cardiovascular;  Laterality: N/A;   CATARACT EXTRACTION     CATARACT EXTRACTION W/ INTRAOCULAR LENS  IMPLANT, BILATERAL Bilateral    COLONOSCOPY     ESOPHAGOGASTRODUODENOSCOPY     IR ANGIO INTRA EXTRACRAN SEL COM CAROTID INNOMINATE BILAT MOD SED  12/30/2017   IR ANGIO VERTEBRAL SEL VERTEBRAL BILAT MOD SED  12/30/2017   LAPAROSCOPIC CHOLECYSTECTOMY  08/11/1999   LUMBAR DISC SURGERY  02/19/2014   ORIF ANKLE FRACTURE Left 04/13/2015   Procedure: OPEN REDUCTION INTERNAL FIXATION (ORIF) LEFT ANKLE FRACTURE;  Surgeon: Glendia Cordella Hutchinson, MD;  Location: [facility] OR;  Service: Orthopedics;  Laterality: Left;   PACEMAKER PLACEMENT Right 07/22/2010   a. MDT  dual chamber PPM implanted by Dr Fernande    [facility] GENERATOR CHANGEOUT N/A 08/22/2022   Procedure: PPM GENERATOR HERMA;  Surgeon: Fernande Elspeth BROCKS, MD;  Location: [facility] INVASIVE CV LAB;  Service: Cardiovascular;  Laterality: N/A;   SHOULDER ARTHROSCOPY W/ ROTATOR CUFF REPAIR Right 08/30/2014   WITH MINI-OPEN ROTATOR CUFF REPAIR AND SUBACROMIAL DECOMPRESSION   SHOULDER ARTHROSCOPY WITH ROTATOR CUFF REPAIR AND SUBACROMIAL  DECOMPRESSION Right 08/30/2014   Procedure: SHOULDER ARTHROSCOPY WITH MINI-OPEN ROTATOR CUFF REPAIR AND SUBACROMIAL DECOMPRESSION;  Surgeon: Cordella Glendia Hutchinson, MD;  Location: [facility] OR;  Service: Orthopedics;  Laterality: Right;  RIGHT SHOULDER DIAGNOSTIC OPERATIVE ARTHROSCOPY, SUBACROMIAL DECOMPRESSION, MINI-OPEN ROTATOR CUFF REPAIR.   TOTAL HIP ARTHROPLASTY Right 08/14/2021   Procedure: TOTAL HIP ARTHROPLASTY ANTERIOR APPROACH;  Surgeon: Sheril Coy, MD;  Location: [facility] ORS;  Service: Orthopedics;  Laterality: Right;   TOTAL KNEE ARTHROPLASTY Left 02/13/2021   Procedure: LEFT TOTAL KNEE ARTHROPLASTY;  Surgeon: Sheril Coy, MD;  Location: [facility] ORS;  Service: Orthopedics;  Laterality: Left;   TOTAL KNEE ARTHROPLASTY Right 11/11/2023   Procedure: ARTHROPLASTY, KNEE, TOTAL;  Surgeon: Sheril Coy, MD;  Location: [facility];  Service: Orthopedics;  Laterality: Right;  RIGHT KNEE ARTHROPLASTY   VAGINAL HYSTERECTOMY  07/22/1968   WRIST FRACTURE SURGERY Left    HPI:  Patient is an 89 y.o. woman with medical history significant of A-fib on Eliquis , hypertension, OSA and CPAP and bilateral TKR with recent right TKR who was found down in her walk-in closet and is admitted for syncope on 8/24. Patient was discharged on 8/26. On 8/28, she returned to the hospital presenting with worsening neurologic status, particularly lower extremity weakness with CT images with worsening subarachnoid hemorrhage, intraparenchymal hemorrhage, stable subdural hematoma. Patient failed the Alanna Hila with nursing and SLP swallow evaluation ordered.   Assessment / Plan / Recommendation Clinical Impression  Patient was alert and awake when SLP entered the room. Patient was oriented to person, place, time, and situation. Patient described the family members present with her in the room. Her spouse, son, and daughter in law were in the room with her. Patient expressed concern for her IV alerting before she bent her arms, demonstrating  her cognitive awareness. Patient showed good anticipatory awareness of her left sided facial and bilabial weakness. Patient followed one step commands independently. SLP will follow up and monitor progress.    SLP Assessment  SLP Recommendation/Assessment: Patient needs continued Speech Language Pathology Services SLP Visit Diagnosis: Dysarthria and anarthria (R47.1);Cognitive communication deficit (R41.841) (Cognition appeared intact at basic level, SLP will further assess higher level cognition.)     Assistance Recommended at Discharge     Functional Status Assessment Patient has had a recent decline in their functional status and demonstrates the ability to make significant improvements in function in a reasonable and predictable amount of time.  Frequency and Duration min 2x/week  1 week      SLP Evaluation Cognition  Overall Cognitive Status: Within Functional Limits for tasks assessed Arousal/Alertness: Awake/alert Orientation Level: Oriented X4 Year: 2025 Month: August Day of Week: Correct Attention: Focused Focused Attention: Appears intact Awareness: Appears intact Safety/Judgment: Appears intact       Comprehension  Auditory Comprehension Overall Auditory Comprehension: Appears within functional limits for tasks assessed Yes/No Questions: Within Functional Limits Commands: Within Functional Limits Conversation: Simple    Expression Expression Primary Mode of Expression: Verbal Verbal Expression Overall Verbal Expression: Impaired (speech impacted from left side weakness)   Oral / Motor  Oral Motor/Sensory Function Overall  Oral Motor/Sensory Function: Moderate impairment Facial ROM: Reduced left Facial Symmetry: Abnormal symmetry left Facial Strength: Reduced left Facial Sensation: Reduced left Lingual ROM: Within Functional Limits Lingual Symmetry: Within Functional Limits Lingual Strength: Reduced Mandible: Within Functional Limits Motor Speech Overall  Motor Speech: Impaired (speech impacted from left sided weakness) Respiration: Within functional limits Phonation: Low vocal intensity Resonance: Within functional limits Articulation: Within functional limitis            Damien Hy  Graduate SLP Clinican

## 2024-03-18 NOTE — Consult Note (Addendum)
 Reason for Consult: Worsening pontine hemorrhage Referring Physician: ER MD  Brittany [name] is an 89 y.o. female.  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  The patient was neurologically stable at that time however within 24 hours after discharge she developed significant weakness in the left side she has returned to the emergency department.  CT scan demonstrates that the pontine hemorrhage has enlarged with now some subarachnoid blood in the left sylvian fissure and evidence of some blood pooling in the occipital horns.  Has no other mass effect save for the mass effect against the side of the pons.  Given the patient's frail status she is not a surgical candidate and in consideration of aggressive intervention is not a viable option.  Past Medical History:  Diagnosis Date   Ankle fracture 2016   Arthritis    Atrial fibrillation (HCC)    Depression    Dysrhythmia    Elevated parathyroid hormone    Fracture of orbital floor with routine healing    GERD (gastroesophageal reflux disease)    History of colon polyps    benign   Hypercalcemia    Hypertension    Hyponatremia    Hypothyroid    Macular degeneration    wet in the right and dry in the left   OSA on CPAP    Osteoporosis    Peripheral vascular disease (HCC)    Presence of permanent cardiac pacemaker    PSVT (paroxysmal supraventricular tachycardia) (HCC)    Rotator cuff tear    Sinus node dysfunction (HCC)    a. s/p MDT pacemaker   Stroke [facility])    Urinary incontinence    Wrist fracture 2018    Past Surgical History:  Procedure Laterality Date   BACK SURGERY     CARDIOVERSION N/A 05/02/2022   Procedure: CARDIOVERSION;  Surgeon: Mona Vinie BROCKS, MD;  Location: [facility] ENDOSCOPY;  Service: Cardiovascular;  Laterality: N/A;   CATARACT EXTRACTION     CATARACT EXTRACTION W/ INTRAOCULAR LENS  IMPLANT, BILATERAL Bilateral    COLONOSCOPY      ESOPHAGOGASTRODUODENOSCOPY     IR ANGIO INTRA EXTRACRAN SEL COM CAROTID INNOMINATE BILAT MOD SED  12/30/2017   IR ANGIO VERTEBRAL SEL VERTEBRAL BILAT MOD SED  12/30/2017   LAPAROSCOPIC CHOLECYSTECTOMY  08/11/1999   LUMBAR DISC SURGERY  02/19/2014   ORIF ANKLE FRACTURE Left 04/13/2015   Procedure: OPEN REDUCTION INTERNAL FIXATION (ORIF) LEFT ANKLE FRACTURE;  Surgeon: Glendia Cordella Hutchinson, MD;  Location: [facility] OR;  Service: Orthopedics;  Laterality: Left;   PACEMAKER PLACEMENT Right 07/22/2010   a. MDT dual chamber PPM implanted by Dr Fernande    [facility] GENERATOR CHANGEOUT N/A 08/22/2022   Procedure: PPM GENERATOR HERMA;  Surgeon: Fernande Elspeth BROCKS, MD;  Location: [facility] INVASIVE CV LAB;  Service: Cardiovascular;  Laterality: N/A;   SHOULDER ARTHROSCOPY W/ ROTATOR CUFF REPAIR Right 08/30/2014   WITH MINI-OPEN ROTATOR CUFF REPAIR AND SUBACROMIAL DECOMPRESSION   SHOULDER ARTHROSCOPY WITH ROTATOR CUFF REPAIR AND SUBACROMIAL DECOMPRESSION Right 08/30/2014   Procedure: SHOULDER ARTHROSCOPY WITH MINI-OPEN ROTATOR CUFF REPAIR AND SUBACROMIAL DECOMPRESSION;  Surgeon: Cordella Glendia Hutchinson, MD;  Location: [facility] OR;  Service: Orthopedics;  Laterality: Right;  RIGHT SHOULDER DIAGNOSTIC OPERATIVE ARTHROSCOPY, SUBACROMIAL DECOMPRESSION, MINI-OPEN ROTATOR CUFF REPAIR.   TOTAL HIP ARTHROPLASTY Right 08/14/2021   Procedure: TOTAL HIP ARTHROPLASTY ANTERIOR APPROACH;  Surgeon: Sheril Coy, MD;  Location: [facility] ORS;  Service: Orthopedics;  Laterality: Right;  TOTAL KNEE ARTHROPLASTY Left 02/13/2021   Procedure: LEFT TOTAL KNEE ARTHROPLASTY;  Surgeon: Sheril Coy, MD;  Location: [facility] ORS;  Service: Orthopedics;  Laterality: Left;   TOTAL KNEE ARTHROPLASTY Right 11/11/2023   Procedure: ARTHROPLASTY, KNEE, TOTAL;  Surgeon: Sheril Coy, MD;  Location: [facility] ORS;  Service: Orthopedics;  Laterality: Right;  RIGHT KNEE ARTHROPLASTY   VAGINAL HYSTERECTOMY  07/22/1968   WRIST FRACTURE SURGERY Left     Family History  Problem Relation Age  of Onset   Colon cancer Mother        colon   Cancer Father        unknown type   Stroke Sister    Skin cancer Brother    Heart disease Brother    Breast cancer Paternal Aunt    Asthma Brother     Social History:  reports that she quit smoking about 33 years ago. Her smoking use included cigarettes. She started smoking about 78 years ago. She has a 45 pack-year smoking history. She has never used smokeless tobacco. She reports current alcohol  use of about 28.0 standard drinks of alcohol  per week. She reports that she does not use drugs.  Allergies:  Allergies  Allergen Reactions   Citalopram  Hydrobromide Other (See Comments)    QT prolongation with Tikosyn    Adhesive [Tape] Swelling and Rash   Bacitracin-Polymyxin B Rash   Benzalkonium Chloride Rash    Pt was not aware of this allergy   Neosporin [Neomycin-Polymyxin-Gramicidin] Swelling and Rash    Medications: I have reviewed the patient's current medications.  Results for orders placed or performed during the hospital encounter of 03/18/24 (from the past 48 hours)  Resp panel by RT-PCR (RSV, Flu A&B, Covid) Anterior Nasal Swab     Status: None   Collection Time: 03/18/24 10:47 AM   Specimen: Anterior Nasal Swab  Result Value Ref Range   SARS Coronavirus 2 by RT PCR NEGATIVE NEGATIVE   Influenza A by PCR NEGATIVE NEGATIVE   Influenza B by PCR NEGATIVE NEGATIVE    Comment: (NOTE) The Xpert Xpress SARS-CoV-2/FLU/RSV plus assay is intended as an aid in the diagnosis of influenza from Nasopharyngeal swab specimens and should not be used as a sole basis for treatment. Nasal washings and aspirates are unacceptable for Xpert Xpress SARS-CoV-2/FLU/RSV testing.  Fact Sheet for Patients: BloggerCourse.com  Fact Sheet for Healthcare Providers: SeriousBroker.it  This test is not yet approved or cleared by the [place]  FDA and has been authorized for detection and/or  diagnosis of SARS-CoV-2 by FDA under an Emergency Use Authorization (EUA). This EUA will remain in effect (meaning this test can be used) for the duration of the COVID-19 declaration under Section 564(b)(1) of the Act, 21 U.S.C. section 360bbb-3(b)(1), unless the authorization is terminated or revoked.     Resp Syncytial Virus by PCR NEGATIVE NEGATIVE    Comment: (NOTE) Fact Sheet for Patients: BloggerCourse.com  Fact Sheet for Healthcare Providers: SeriousBroker.it  This test is not yet approved or cleared by the [place]  FDA and has been authorized for detection and/or diagnosis of SARS-CoV-2 by FDA under an Emergency Use Authorization (EUA). This EUA will remain in effect (meaning this test can be used) for the duration of the COVID-19 declaration under Section 564(b)(1) of the Act, 21 U.S.C. section 360bbb-3(b)(1), unless the authorization is terminated or revoked.  Performed at [facility], [address], [place], [place] [address]   CBC with Differential     Status: Abnormal   Collection  Time: 03/18/24 10:47 AM  Result Value Ref Range   WBC 13.5 (H) 4.0 - 10.5 K/uL   RBC 4.68 3.87 - 5.11 MIL/uL   Hemoglobin 14.2 12.0 - 15.0 g/dL   HCT 58.3 63.9 - 53.9 %   MCV 88.9 80.0 - 100.0 fL   MCH 30.3 26.0 - 34.0 pg   MCHC 34.1 30.0 - 36.0 g/dL   RDW 84.6 88.4 - 84.4 %   Platelets 271 150 - 400 K/uL   nRBC 0.0 0.0 - 0.2 %   Neutrophils Relative % 84 %   Neutro Abs 11.4 (H) 1.7 - 7.7 K/uL   Lymphocytes Relative 5 %   Lymphs Abs 0.7 0.7 - 4.0 K/uL   Monocytes Relative 10 %   Monocytes Absolute 1.3 (H) 0.1 - 1.0 K/uL   Eosinophils Relative 0 %   Eosinophils Absolute 0.0 0.0 - 0.5 K/uL   Basophils Relative 0 %   Basophils Absolute 0.0 0.0 - 0.1 K/uL   Immature Granulocytes 1 %   Abs Immature Granulocytes 0.10 (H) 0.00 - 0.07 K/uL    Comment: Performed at [facility], [address], [place], [place]  [address]  Troponin I (High Sensitivity)     Status: Abnormal   Collection Time: 03/18/24 10:47 AM  Result Value Ref Range   Troponin I (High Sensitivity) 140 (HH) <18 ng/L    Comment: CRITICAL RESULT CALLED TO, READ BACK BY AND VERIFIED WITH J.KAMIS,RN 1215 03/18/24 CLARK,S (NOTE) Elevated high sensitivity troponin I (hsTnI) values and significant  changes across serial measurements may suggest ACS but many other  chronic and acute conditions are known to elevate hsTnI results.  Refer to the Links section for chest pain algorithms and additional  guidance. Performed at [facility], [address], [place], [place] [address]   Comprehensive metabolic panel     Status: Abnormal   Collection Time: 03/18/24 10:47 AM  Result Value Ref Range   Sodium 125 (L) 135 - 145 mmol/L   Potassium 4.5 3.5 - 5.1 mmol/L    Comment: HEMOLYSIS AT THIS LEVEL MAY AFFECT RESULT   Chloride 93 (L) 98 - 111 mmol/L   CO2 22 22 - 32 mmol/L   Glucose, Bld 153 (H) 70 - 99 mg/dL    Comment: Glucose reference range applies only to samples taken after fasting for at least 8 hours.   BUN 10 8 - 23 mg/dL   Creatinine, Ser 9.44 0.44 - 1.00 mg/dL   Calcium  10.1 8.9 - 10.3 mg/dL   Total Protein 7.2 6.5 - 8.1 g/dL   Albumin 3.9 3.5 - 5.0 g/dL   AST 29 15 - 41 U/L    Comment: HEMOLYSIS AT THIS LEVEL MAY AFFECT RESULT   ALT 23 0 - 44 U/L    Comment: HEMOLYSIS AT THIS LEVEL MAY AFFECT RESULT   Alkaline Phosphatase 77 38 - 126 U/L   Total Bilirubin 2.1 (H) 0.0 - 1.2 mg/dL    Comment: HEMOLYSIS AT THIS LEVEL MAY AFFECT RESULT   GFR, Estimated >60 >60 mL/min    Comment: (NOTE) Calculated using the CKD-EPI Creatinine Equation (2021)    Anion gap 10 5 - 15    Comment: Performed at [facility], [address], [place], [place] [address]  Brain natriuretic peptide     Status: Abnormal   Collection Time: 03/18/24 10:47 AM  Result Value Ref Range   B Natriuretic Peptide 279.4 (H) 0.0 - 100.0 pg/mL  Comment:  Performed at [facility], [address], [place], [place] [address]  Troponin I (High Sensitivity)     Status: Abnormal   Collection Time: 03/18/24  1:00 PM  Result Value Ref Range   Troponin I (High Sensitivity) 134 (HH) <18 ng/L    Comment: CRITICAL VALUE NOTED. VALUE IS CONSISTENT WITH PREVIOUSLY REPORTED/CALLED VALUE (NOTE) Elevated high sensitivity troponin I (hsTnI) values and significant  changes across serial measurements may suggest ACS but many other  chronic and acute conditions are known to elevate hsTnI results.  Refer to the Links section for chest pain algorithms and additional  guidance. Performed at [facility], [address], [place], [place] [address]   MRSA Next Gen by PCR, Nasal     Status: None   Collection Time: 03/18/24  3:14 PM   Specimen: Nasal Mucosa; Nasal Swab  Result Value Ref Range   MRSA by PCR Next Gen NOT DETECTED NOT DETECTED    Comment: (NOTE) The GeneXpert MRSA Assay (FDA approved for NASAL specimens only), is one component of a comprehensive MRSA colonization surveillance program. It is not intended to diagnose MRSA infection nor to guide or monitor treatment for MRSA infections. Test performance is not FDA approved in patients less than 13 years old. Performed at [facility], [address], [place], [place] [address]     CT ANGIO HEAD NECK W WO CM Result Date: 03/18/2024 CLINICAL DATA:  Provided history: Stroke/TIA, determine embolic source. EXAM: CT ANGIOGRAPHY HEAD AND NECK WITH AND WITHOUT CONTRAST TECHNIQUE: Multidetector CT imaging of the head and neck was performed using the standard protocol during bolus administration of intravenous contrast. Multiplanar CT image reconstructions and MIPs were obtained to evaluate the vascular anatomy. Carotid stenosis measurements (when applicable) are obtained utilizing NASCET criteria, using the distal internal carotid diameter as the denominator. RADIATION DOSE REDUCTION: This  exam was performed according to the departmental dose-optimization program which includes automated exposure control, adjustment of the mA and/or kV according to patient size and/or use of iterative reconstruction technique. CONTRAST:  75mL OMNIPAQUE  IOHEXOL  350 MG/ML SOLN COMPARISON:  Non-contrast head CT performed earlier today 03/18/2024. CTA head/neck 12/30/2017. FINDINGS: CTA NECK FINDINGS Aortic arch: Standard aortic branching. Atherosclerotic plaque within the aortic arch and proximal major branch vessels of the neck. No hemodynamically significant innominate or proximal subclavian artery stenosis. Right carotid system: CCA and ICA patent within the neck. Atherosclerotic plaque. Most notably, there is progressive bulky (predominantly calcified) plaque within the distal CCA, about the carotid bifurcation and within the proximal ICA. Resultant severe, near occlusive stenoses within the distal CCA and proximal ICA. Left carotid system: CCA and ICA patent within the neck. Atherosclerotic plaque. Most notably, there is progressive predominantly calcified plaque within the distal CCA, about the carotid bifurcation and within the proximal ICA. Estimated 50% stenosis of the distal CCA (relative to the ICA). Less than 50% stenosis of the proximal ICA. Vertebral arteries: Codominant and patent within the neck. Atherosclerotic plaque within both vessels, most notably as follows. Unchanged severe atherosclerotic narrowing of the right vertebral artery origin. Progressive moderate to severe atherosclerotic stenosis within the left vertebral artery at the V3/V4 junction. Additionally, the bilateral vertebral arteries are moderately narrowed at the C4-C5 level due to degenerative bony spurring. Skeleton: Spondylosis at the cervical and visible upper thoracic levels. Multilevel ventral osteophytes (some of which are bridging at the upper thoracic levels). No acute fracture or aggressive osseous lesion. Other neck:  Subcentimeter right  thyroid  lobe nodule not meeting consensus criteria for ultrasound follow-up based on size. No follow-up imaging recommended. Reference: J Am Coll Radiol. 2015 Feb;12(2): 143-50. Upper chest: No consolidation within the imaged lung apices. Review of the MIP images confirms the above findings CTA HEAD FINDINGS Anterior circulation: The intracranial internal carotid arteries are patent. Atherosclerotic plaque within both vessels. Most notably, there is up to moderate stenosis of the right paraclinoid segment. The M1 middle cerebral arteries are patent. Atherosclerotic irregularity of the M2 and more distal MCA branches, bilaterally. No M2 proximal branch occlusion or high-grade proximal stenosis. The anterior cerebral arteries are patent. No intracranial aneurysm is identified. Posterior circulation: The intracranial vertebral arteries are patent. Calcified atherosclerotic plaque within the left vertebral artery at the V3/V4 junction resulting in moderate-to-severe stenosis. The basilar artery is patent. The posterior cerebral arteries are patent. Atherosclerotic irregularity of both vessels. Most notably, there is a severe stenosis within a left PCA branch at the distal P2 segment level (series 12, image 31) and a moderate stenosis within a right PCA branch at the P2/P3 junction (series 12, image 23). These stenoses are new from the prior CTA of 12/30/2017. A right posterior communicating artery is present. The left posterior communicating artery is diminutive or absent. Venous sinuses: Contrast timing limits evaluation for dural venous sinus thrombosis. Anatomic variants: As described. Review of the MIP images confirms the above findings IMPRESSION: CTA neck: 1. The common carotid and internal carotid arteries are patent within the neck. 2. Progressive bulky atherosclerotic plaque within the distal common carotid artery, about the carotid bifurcation and within the proximal ICA on the right.  Progressive severe, near-occlusive stenosis of the distal right common carotid artery and proximal right internal carotid artery. 3. Progressive atherosclerotic plaque within the distal common carotid artery, about the carotid bifurcation and within the proximal ICA on the left. Progressive stenosis of the distal left common carotid artery, now estimated at 50%. 4. Vertebral arteries patent within the neck. As before, there is severe atherosclerotic narrowing of the right vertebral artery origin. Progressive plaque within the left vertebral artery at the V3/V4 junction, now resulting in moderate-to-severe stenosis. Additionally, the vertebral arteries are moderately narrowed bilaterally at the C4-C5 level due to degenerative bony spurring. 5. Aortic Atherosclerosis (ICD10-I70.0). CTA head: 1. No proximal intracranial large vessel occlusion identified. 2. Intracranial atherosclerotic disease with multifocal stenoses, most notably as follows. 3. Up to moderate stenosis of the paraclinoid right ICA. 4. Moderate-to-severe stenosis of the left vertebral artery at the V3/V4 junction. 5. Severe stenosis within a left posterior cerebral artery branch at the distal P2 segment level, new from the prior CTA of 12/30/2017. 6. Moderate stenosis within a right posterior cerebral artery branch at the P2/P3 junction, new from the prior CTA. Electronically Signed   By: Rockey Childs D.O.   On: 03/18/2024 14:32   DG Chest Portable 1 View Result Date: 03/18/2024 CLINICAL DATA:  Weakness. EXAM: PORTABLE CHEST 1 VIEW COMPARISON:  March 14, 2024. FINDINGS: Stable cardiomediastinal silhouette. Left-sided pacemaker is unchanged. Lungs are clear. Bony thorax is unremarkable. IMPRESSION: No active disease. Electronically Signed   By: Lynwood Landy Raddle M.D.   On: 03/18/2024 12:23   CT Head Wo Contrast Result Date: 03/18/2024 CLINICAL DATA:  Headache, neuro deficit EXAM: CT HEAD WITHOUT CONTRAST TECHNIQUE: Contiguous axial images were  obtained from the base of the skull through the vertex without intravenous contrast. RADIATION DOSE REDUCTION: This exam was performed according to the departmental dose-optimization program which includes  automated exposure control, adjustment of the mA and/or kV according to patient size and/or use of iterative reconstruction technique. COMPARISON:  CT head 03/14/2024 FINDINGS: Brain: Increased size of the extra-axial collection left cerebellopontine angle with mass effect upon the pons. Suspected intraparenchymal hemorrhage within the pons possibly extending to the fourth ventricle. This measures 2.5 x 2.2 cm in the axial plane (series 2, image 8) compared to 1.5 x 1.8 cm on 03/14/2024. Increased subarachnoid hemorrhage along the left cerebellar hemisphere and in the perimesencephalic cisterns. New subarachnoid hemorrhage along the left sylvian fissure (series 2, image 17). New layering intraventricular hemorrhage in the occipital horns of the lateral ventricles. 4 mm para falcine subdural hematoma is unchanged. No midline shift. Chronic atrophy and small vessel ischemic disease. Vascular: No hyperdense vessel. Intracranial arterial calcification. Skull: No fracture or focal lesion. Sinuses/Orbits: No acute finding. Other: None. IMPRESSION: 1. Increased size of the extra-axial collection in the left cerebellopontine angle with mass effect upon the pons. 2. Suspected intraparenchymal hemorrhage within the pons possibly extending to the fourth ventricle. 3. Increased subarachnoid hemorrhage along the left cerebellar hemisphere, in the perimesencephalic cisterns, and subarachnoid hemorrhage along the left Sylvian fissure. 4. New layering intraventricular hemorrhage in the occipital horns of the lateral ventricles. 5. 4 mm para falcine subdural hematoma is unchanged. Critical Value/emergent results were called by telephone at the time of interpretation on 03/18/2024 at 11:28 am to provider GLENDIA BREEDING , who  verbally acknowledged these results. Electronically Signed   By: Norman Gatlin M.D.   On: 03/18/2024 11:37    Review of Systems  Constitutional:  Positive for activity change.  Neurological:  Positive for facial asymmetry and weakness.   Blood pressure 116/76, pulse (!) 110, temperature 98 F (36.7 C), temperature source Oral, resp. rate (!) 23, last menstrual period 07/22/1968, SpO2 (!) 89%. Physical Exam Constitutional:      Appearance: Normal appearance.     Comments: Patient has a marked left facial weakness that appears central in origin.  She is hard of hearing.  She moves the left upper extremity more weakly than she does the right side moves the left lower extremity more weakly than she does the right side there is an immediate cortical drift on the left side.  She is coherent and alert otherwise.  Tongue deviates to the left side.  Neurological:     Mental Status: She is alert.     Assessment/Plan: Worsening pontine hemorrhage status post discontinuation of Eliquis   Patient is not a surgical candidate.  Due to the fact that this is not an aneurysmal subarachnoid hemorrhage I do not believe the patient needs to be maintained on nimodipine .  I will discontinue this medication.  I also agree with DNR status for this individual.  Victory JINNY Gens 03/18/2024, 6:58 PM

## 2024-03-18 NOTE — ED Provider Notes (Signed)
 [name] Provider Note   CSN: [id number] Arrival date & time: 03/18/24  1032     Patient presents with: Altered Mental Status   [name] is a 89 y.o. female.   HPI 89 year old female presents via EMS with weakness.  Patient was discharged from this facility 2 days ago after falling and suffering a a brain bleed.  Patient was taken off of her Eliquis  and has not restarted.  Patient has been getting weaker to the point that now family is having to use a wheelchair which is not typical for her.  No reported fever.  EMS reports that her initial blood pressures were in the 200s and she had A-fib with intermittent RVR.  Patient denies new or worsening headache but has a continued headache since her original fall.  No vision complaints, focal weakness, vomiting, chest pain, shortness of breath.  She feels weak all over.  Prior to Admission medications   Medication Sig Start Date End Date Taking? Authorizing Provider  acetaminophen  (TYLENOL ) 500 MG tablet Take 500 mg by mouth 2 (two) times daily.   Yes [provider]  alendronate  (FOSAMAX ) 70 MG tablet Take 1 tablet (70 mg total) by mouth every 7 (seven) days. Take with a full glass of water  on an empty stomach. 03/04/17  Yes Kuneff, Renee A, DO  atorvastatin  (LIPITOR) 40 MG tablet Take 40 mg by mouth daily.   Yes [provider]  B Complex-C (B-COMPLEX WITH VITAMIN C) tablet Take 1 tablet by mouth daily.   Yes [provider]  Cholecalciferol  (VITAMIN D3) 125 MCG (5000 UT) TABS Take 5,000 Units by mouth daily.   Yes [provider]  furosemide  (LASIX ) 40 MG tablet Take 1 tablet (40 mg total) by mouth daily. 03/17/24  Yes Zenaida Morene PARAS, MD  levothyroxine  (SYNTHROID ) 88 MCG tablet Take 88 mcg by mouth daily before breakfast.   Yes [provider]  magnesium  oxide (MAG-OX) 400 MG tablet Take 1 tablet (400 mg total) by mouth daily. 03/21/14  Yes  Fernande Elspeth BROCKS, MD  Melatonin 10 MG TABS Take 10-40 mg by mouth at bedtime as needed (sleep).   Yes [provider]  metoprolol  succinate (TOPROL -XL) 50 MG 24 hr tablet Take 50 mg by mouth in the morning and at bedtime. 11/17/23  Yes [provider]  Multiple Vitamin (MULTIVITAMIN WITH MINERALS) TABS tablet Take 1 tablet by mouth daily.   Yes [provider]  Multiple Vitamins-Minerals (PRESERVISION AREDS 2 PO) Take 1 capsule by mouth 2 (two) times daily with a meal.    Yes [provider]  sertraline  (ZOLOFT ) 50 MG tablet Take 1 tablet (50 mg total) by mouth daily. 03/22/19 03/17/25 Yes Dow Arland BROCKS, NP  spironolactone  (ALDACTONE ) 25 MG tablet Take 1 tablet (25 mg total) by mouth daily. 10/14/23  Yes Zenaida Morene PARAS, MD    Allergies: Citalopram  hydrobromide, Adhesive [tape], Bacitracin-polymyxin b, Benzalkonium chloride, and Neosporin [neomycin-polymyxin-gramicidin]    Review of Systems  Constitutional:  Negative for fever.  Eyes:  Negative for visual disturbance.  Respiratory:  Negative for cough and shortness of breath.   Cardiovascular:  Negative for chest pain.  Gastrointestinal:  Negative for abdominal pain.  Genitourinary:  Negative for dysuria.  Neurological:  Positive for weakness and headaches.    Updated Vital Signs BP (!) 141/59   Pulse 86   Temp 97.6 F (36.4 C) (Rectal)   Resp (!) 21  LMP 07/22/1968   SpO2 97%   Physical Exam Vitals and nursing note reviewed.  Constitutional:      Appearance: She is well-developed.  HENT:     Head: Normocephalic.     Comments: Ecchymosis near her right eye Eyes:     Extraocular Movements: Extraocular movements intact.     Right eye: Nystagmus present.     Left eye: Nystagmus present.     Pupils: Pupils are equal, round, and reactive to light.  Cardiovascular:     Rate and Rhythm: Tachycardia present. Rhythm irregular.     Heart sounds: Normal heart sounds.  Pulmonary:     Effort:  Pulmonary effort is normal.     Breath sounds: Normal breath sounds.  Abdominal:     Palpations: Abdomen is soft.     Tenderness: There is no abdominal tenderness.  Musculoskeletal:     Right lower leg: No edema.     Left lower leg: No edema.  Skin:    General: Skin is warm and dry.  Neurological:     Mental Status: She is alert.     Comments: Appears to have a mild facial droop and slurred speech.  Has 5/5 strength in both upper extremities but 4/5 strength in both lower extremities.  She is awake, alert, oriented to person, place, time.     (all labs ordered are listed, but only abnormal results are displayed) Labs Reviewed  CBC WITH DIFFERENTIAL/PLATELET - Abnormal; Notable for the following components:      Result Value   WBC 13.5 (*)    Neutro Abs 11.4 (*)    Monocytes Absolute 1.3 (*)    Abs Immature Granulocytes 0.10 (*)    All other components within normal limits  COMPREHENSIVE METABOLIC PANEL WITH GFR - Abnormal; Notable for the following components:   Sodium 125 (*)    Chloride 93 (*)    Glucose, Bld 153 (*)    Total Bilirubin 2.1 (*)    All other components within normal limits  BRAIN NATRIURETIC PEPTIDE - Abnormal; Notable for the following components:   B Natriuretic Peptide 279.4 (*)    All other components within normal limits  TROPONIN I (HIGH SENSITIVITY) - Abnormal; Notable for the following components:   Troponin I (High Sensitivity) 140 (*)    All other components within normal limits  RESP PANEL BY RT-PCR (RSV, FLU A&B, COVID)  [id number]  URINALYSIS, W/ REFLEX TO CULTURE (INFECTION SUSPECTED)  TROPONIN I (HIGH SENSITIVITY)    EKG: EKG Interpretation Date/Time:  Thursday March 18 2024 10:43:57 EDT Ventricular Rate:  137 PR Interval:    QRS Duration:  105 QT Interval:  287 QTC Calculation: 434 R Axis:   53  Text Interpretation: Atrial fibrillation Anteroseptal infarct, age indeterminate Abnormal T, consider ischemia, diffuse leads Lateral leads  are also involved ST/T changes similar to Mar 14 2024 Confirmed by Freddi Hamilton [phone]) on 03/18/2024 10:46:14 AM  Radiology: DG Chest Portable 1 View Result Date: 03/18/2024 CLINICAL DATA:  Weakness. EXAM: PORTABLE CHEST 1 VIEW COMPARISON:  March 14, 2024. FINDINGS: Stable cardiomediastinal silhouette. Left-sided pacemaker is unchanged. Lungs are clear. Bony thorax is unremarkable. IMPRESSION: No active disease. Electronically Signed   By: Lynwood Landy Raddle M.D.   On: 03/18/2024 12:23   CT Head Wo Contrast Result Date: 03/18/2024 CLINICAL DATA:  Headache, neuro deficit EXAM: CT HEAD WITHOUT CONTRAST TECHNIQUE: Contiguous axial images were obtained from the base of the skull through the vertex without intravenous contrast. RADIATION DOSE  REDUCTION: This exam was performed according to the departmental dose-optimization program which includes automated exposure control, adjustment of the mA and/or kV according to patient size and/or use of iterative reconstruction technique. COMPARISON:  CT head 03/14/2024 FINDINGS: Brain: Increased size of the extra-axial collection left cerebellopontine angle with mass effect upon the pons. Suspected intraparenchymal hemorrhage within the pons possibly extending to the fourth ventricle. This measures 2.5 x 2.2 cm in the axial plane (series 2, image 8) compared to 1.5 x 1.8 cm on 03/14/2024. Increased subarachnoid hemorrhage along the left cerebellar hemisphere and in the perimesencephalic cisterns. New subarachnoid hemorrhage along the left sylvian fissure (series 2, image 17). New layering intraventricular hemorrhage in the occipital horns of the lateral ventricles. 4 mm para falcine subdural hematoma is unchanged. No midline shift. Chronic atrophy and small vessel ischemic disease. Vascular: No hyperdense vessel. Intracranial arterial calcification. Skull: No fracture or focal lesion. Sinuses/Orbits: No acute finding. Other: None. IMPRESSION: 1. Increased size of the  extra-axial collection in the left cerebellopontine angle with mass effect upon the pons. 2. Suspected intraparenchymal hemorrhage within the pons possibly extending to the fourth ventricle. 3. Increased subarachnoid hemorrhage along the left cerebellar hemisphere, in the perimesencephalic cisterns, and subarachnoid hemorrhage along the left Sylvian fissure. 4. New layering intraventricular hemorrhage in the occipital horns of the lateral ventricles. 5. 4 mm para falcine subdural hematoma is unchanged. Critical Value/emergent results were called by telephone at the time of interpretation on 03/18/2024 at 11:28 am to provider GLENDIA BREEDING , who verbally acknowledged these results. Electronically Signed   By: Norman Gatlin M.D.   On: 03/18/2024 11:37     .Critical Care  Performed by: BREEDING GLENDIA, MD Authorized by: BREEDING GLENDIA, MD   Critical care provider statement:    Critical care time (minutes):  45   Critical care time was exclusive of:  Separately billable procedures and treating other patients   Critical care was necessary to treat or prevent imminent or life-threatening deterioration of the following conditions:  Cardiac failure and CNS failure or compromise   Critical care was time spent personally by me on the following activities:  Development of treatment plan with patient or surrogate, discussions with consultants, evaluation of patient's response to treatment, examination of patient, ordering and review of laboratory studies, ordering and review of radiographic studies, ordering and performing treatments and interventions, pulse oximetry, re-evaluation of patient's condition and review of old charts    Medications Ordered in the ED  diltiazem  (CARDIZEM ) 125 mg in dextrose  5% 125 mL (1 mg/mL) infusion (5 mg/hr Intravenous New Bag/Given 03/18/24 1144)  clevidipine  (CLEVIPREX ) infusion 0.5 mg/mL (8 mg/hr Intravenous Rate/Dose Change 03/18/24 1256)  diltiazem  (CARDIZEM ) injection 15  mg (15 mg Intravenous Given 03/18/24 1141)  acetaminophen  (TYLENOL ) suppository 650 mg (650 mg Rectal Given 03/18/24 1236)  iohexol  (OMNIPAQUE ) 350 MG/ML injection 75 mL (75 mLs Intravenous Contrast Given 03/18/24 1318)                                    Medical Decision Making Amount and/or Complexity of Data Reviewed Independent Historian: spouse and EMS External Data Reviewed: notes. Labs: ordered.    Details: Leukocytosis Radiology: ordered.    Details: Worsening head bleed ECG/medicine tests: ordered and independent interpretation performed.    Details: Atrial fibrillation RVR  Risk OTC drugs. Prescription drug management. Decision regarding hospitalization.   Patient is found to have worsening head  bleed on CT.  She is awake, alert, protecting her airway though did follow her swallowing screen.  Has A-fib with RVR and was given diltiazem  bolus and drip.  This helped her heart rate but her blood pressure still elevated.  I discussed case with Dr. Colon, no indication for emergent surgery, but recommends a CTA of the head to evaluate for possible aneurysmal cause of subarachnoid.  He will see in consultation.  Recommends blood pressure control around 150.  Given she still hypertensive, was put on Cleviprex .  I discussed with the ICU, who will admit.  I have updated family at the bedside.     Final diagnoses:  Intraparenchymal hemorrhage of brain [facility])  Atrial fibrillation with RVR [facility])  Hypertensive emergency    ED Discharge Orders     None          Freddi Hamilton, MD 03/18/24 1325

## 2024-03-18 NOTE — ED Notes (Signed)
 Patient was unable to swallow water  and spit some of it out. MD aware. No PO given d/t failed swallow screen.

## 2024-03-18 NOTE — ED Triage Notes (Signed)
 [facility] for weakness, lethargy, HA, AMS. Pt was a fall on thinners this last Sunday and admitted with a brain bleed per family. Pt was discharged on Tuesday. Pt has had slurred speech and facial droop since discharge from hospital. Family confirms no new fall or trauma.   Increased weakness, patient unable to ambulate or go to restroom independently. Decreased PO. Pt complains of feeling weak and having a headache.   Hx of a-fib. 210/80 manual BP, 78-120s in a-fib for EMS, 93% RA, CBG 151.   22 L hand.

## 2024-03-18 NOTE — H&P (Signed)
 NAME:  GARIMA CHRONIS, MRN:  [id number], DOB:  10-16-1933, LOS: 0 ADMISSION DATE:  03/18/2024, CONSULTATION DATE:  03/18/24 REFERRING MD:  ED, CHIEF COMPLAINT:  weakness can not walk   History of Present Illness:  89 y.o. woman with recent mission with traumatic subdural and subarachnoid hemorrhage presents with worsening neurologic status, particularly lower extremity weakness with CT images with worsening subarachnoid hemorrhage, intraparenchymal hemorrhage, stable subdural hematoma.  Patient mated 8/24 with report of syncope.  Hit head.  CT with subdural hematoma.  And subarachnoid hematoma.  Felt traumatic.  Eliquis  was reversed.  No intervention offered via neurosurgery.  She was observed for a day or 2.  She was instructed to no longer take Eliquis  and she has taken no further Eliquis .  She was discharged home reportedly ambulatory.  Yesterday, day after discharge, has been states she cannot walk.  Too weak.  Had to get around with wheelchair.  Did not get better.  Prompting ED visit today.  CT shows worsening intraparenchymal changes as well as subarachnoid hemorrhage or volume of subarachnoid hemorrhage blood.  With some extension into the ventricles.  Stable hematoma.  Blood pressures noted to be elevated.  Placed on initially diltiazem  with A-fib with RVR.  Switched to clevidipine  given no improvement in blood pressure.  Blood pressure subsequently improved.  Labs notable for worsened hyponatremia from prior, labs otherwise appear at baseline.  Pertinent  Medical History  A-fib previous on Eliquis  this was reversed and subsequently discontinued with recent admission for traumatic subdural and subarachnoid hemorrhage discharge 8/26, hypertension  Significant Hospital Events: Including procedures, antibiotic start and stop dates in addition to other pertinent events     Interim History / Subjective:    Objective    Blood pressure 129/62, pulse 98, temperature 97.6 F (36.4 C),  temperature source Rectal, resp. rate 13, last menstrual period 07/22/1968, SpO2 98%.       No intake or output data in the 24 hours ending 03/18/24 1407 There were no vitals filed for this visit.  Examination: General: Lying in bed, chronically ill. HENT: Bruise under right eye, EOMI Lungs: No work of breathing on room air Cardiovascular: Irregular irregular, tachycardic Abdomen: Nondistended Neuro: Left eyelid weakness with closure, cranial nerves otherwise seem intact, strength intact in the upper extremities, strength 3+ at hip flexion on the right, strength 5 out of 5 in the left lower extremity  Resolved problem list   Assessment and Plan   Weakness, slurred speech related to worsening intraparenchymal and subarachnoid hemorrhages following fall, presumably traumatic, with recent admission for the same: -- Neurosurgery consult, appreciate assistance --SBP goal less than 150, clevidipine  -- Frequent neurochecks, no sign of seizure, will hold off on seizure prophylaxis for now -- SLP evaluation for swallow, need awaited get oral medications, consider core track tomorrow if unable -- Follow-up CTA angio read of the head and neck -- Will need PT/OT, likely would benefit from rehabilitation once stable for discharge given no longer ambulatory -- Repeat CT head in the a.m. or sooner pending changes in clinical status  History of hypertension: Seems relatively mild. -- Resume home metoprolol , hold spironolactone  and Lasix  for now -- Clevidipine  as above for SBP goal less than 150 -- Will add IV PRNs if unable to take p.o.  Subdural hematoma: Stable, traumatic.  Atrial fibrillation: -- Resume home metoprolol , hold anticoagulation, this was discontinued last admission with brain bleed  Best Practice (right click and Reselect all SmartList Selections daily)   Diet/type: NPO  w/ oral meds DVT prophylaxis SCD Pressure ulcer(s): pressure ulcer assessment deferred  GI prophylaxis:  N/A Lines: N/A Foley:  N/A Code Status:  DNR Last date of multidisciplinary goals of care discussion [discussed with patient and family at bedside, confirmed DNR status, same CODE STATUS last few admissions]  Labs   CBC: Recent Labs  Lab 03/14/24 0855 03/16/24 0358 03/18/24 1047  WBC 9.6 9.5 13.5*  NEUTROABS  --   --  11.4*  HGB 13.5 13.3 14.2  HCT 41.5 40.1 41.6  MCV 92.6 91.3 88.9  PLT 267 236 271    Basic Metabolic Panel: Recent Labs  Lab 03/14/24 0855 03/15/24 0342 03/16/24 0358 03/18/24 1047  NA 130* 131* 131* 125*  K 5.4* 4.3 4.1 4.5  CL 97* 101 101 93*  CO2 23 24 23 22   GLUCOSE 130* 108* 119* 153*  BUN 11 13 12 10   CREATININE 0.87 0.95 0.84 0.55  CALCIUM  9.9 9.5 9.7 10.1   GFR: Estimated Creatinine Clearance: 58 mL/min (by C-G formula based on SCr of 0.55 mg/dL). Recent Labs  Lab 03/14/24 0855 03/16/24 0358 03/18/24 1047  WBC 9.6 9.5 13.5*    Liver Function Tests: Recent Labs  Lab 03/14/24 0855 03/18/24 1047  AST 30 29  ALT 24 23  ALKPHOS 83 77  BILITOT 1.2 2.1*  PROT 6.6 7.2  ALBUMIN 3.8 3.9   No results for input(s): LIPASE, AMYLASE in the last 168 hours. No results for input(s): AMMONIA in the last 168 hours.  ABG    Component Value Date/Time   TCO2 25 12/01/2022 2040     Coagulation Profile: Recent Labs  Lab 03/14/24 0950  INR 1.2    Cardiac Enzymes: No results for input(s): CKTOTAL, CKMB, CKMBINDEX, TROPONINI in the last 168 hours.  HbA1C: Hgb A1c MFr Bld  Date/Time Value Ref Range Status  12/31/2017 03:29 AM 5.2 4.8 - 5.6 % Final    Comment:    (NOTE) Pre diabetes:          5.7%-6.4% Diabetes:              >6.4% Glycemic control for   <7.0% adults with diabetes   02/23/2013 03:02 PM 5.1 4.6 - 6.5 % Final    Comment:    Glycemic Control Guidelines for People with Diabetes:Non Diabetic:  <6%Goal of Therapy: <7%Additional Action Suggested:  >8%     CBG: Recent Labs  Lab 03/14/24 0902  GLUCAP 133*     Review of Systems:   No chest pain with exertion no orthopnea or PND, comprehensive ROS otherwise WNL  Past Medical History:  She,  has a past medical history of Ankle fracture (2016), Arthritis, Atrial fibrillation (HCC), Depression, Dysrhythmia, Elevated parathyroid hormone, Fracture of orbital floor with routine healing, GERD (gastroesophageal reflux disease), History of colon polyps, Hypercalcemia, Hypertension, Hyponatremia, Hypothyroid, Macular degeneration, OSA on CPAP, Osteoporosis, Peripheral vascular disease (HCC), Presence of permanent cardiac pacemaker, PSVT (paroxysmal supraventricular tachycardia) (HCC), Rotator cuff tear, Sinus node dysfunction (HCC), Stroke (HCC), Urinary incontinence, and Wrist fracture (2018).   Surgical History:   Past Surgical History:  Procedure Laterality Date   BACK SURGERY     CARDIOVERSION N/A 05/02/2022   Procedure: CARDIOVERSION;  Surgeon: Mona Vinie BROCKS, MD;  Location[facility] ENDOSCOPY;  Service: Cardiovascular;  Laterality: N/A;   CATARACT EXTRACTION     CATARACT EXTRACTION W/ INTRAOCULAR LENS  IMPLANT, BILATERAL Bilateral    COLONOSCOPY     ESOPHAGOGASTRODUODENOSCOPY     IR ANGIO INTRA EXTRACRAN SEL  COM CAROTID INNOMINATE BILAT MOD SED  12/30/2017   IR ANGIO VERTEBRAL SEL VERTEBRAL BILAT MOD SED  12/30/2017   LAPAROSCOPIC CHOLECYSTECTOMY  08/11/1999   LUMBAR DISC SURGERY  02/19/2014   ORIF ANKLE FRACTURE Left 04/13/2015   Procedure: OPEN REDUCTION INTERNAL FIXATION (ORIF) LEFT ANKLE FRACTURE;  Surgeon: Glendia Cordella Hutchinson, MD;  Location: [facility] OR;  Service: Orthopedics;  Laterality: Left;   PACEMAKER PLACEMENT Right 07/22/2010   a. MDT dual chamber PPM implanted by Dr Fernande    [facility] GENERATOR CHANGEOUT N/A 08/22/2022   Procedure: PPM GENERATOR HERMA;  Surgeon: Fernande Elspeth BROCKS, MD;  Location: [facility] INVASIVE CV LAB;  Service: Cardiovascular;  Laterality: N/A;   SHOULDER ARTHROSCOPY W/ ROTATOR CUFF REPAIR Right 08/30/2014   WITH MINI-OPEN ROTATOR  CUFF REPAIR AND SUBACROMIAL DECOMPRESSION   SHOULDER ARTHROSCOPY WITH ROTATOR CUFF REPAIR AND SUBACROMIAL DECOMPRESSION Right 08/30/2014   Procedure: SHOULDER ARTHROSCOPY WITH MINI-OPEN ROTATOR CUFF REPAIR AND SUBACROMIAL DECOMPRESSION;  Surgeon: Cordella Glendia Hutchinson, MD;  Location: [facility] OR;  Service: Orthopedics;  Laterality: Right;  RIGHT SHOULDER DIAGNOSTIC OPERATIVE ARTHROSCOPY, SUBACROMIAL DECOMPRESSION, MINI-OPEN ROTATOR CUFF REPAIR.   TOTAL HIP ARTHROPLASTY Right 08/14/2021   Procedure: TOTAL HIP ARTHROPLASTY ANTERIOR APPROACH;  Surgeon: Sheril Coy, MD;  Location: [facility] ORS;  Service: Orthopedics;  Laterality: Right;   TOTAL KNEE ARTHROPLASTY Left 02/13/2021   Procedure: LEFT TOTAL KNEE ARTHROPLASTY;  Surgeon: Sheril Coy, MD;  Location: [facility] ORS;  Service: Orthopedics;  Laterality: Left;   TOTAL KNEE ARTHROPLASTY Right 11/11/2023   Procedure: ARTHROPLASTY, KNEE, TOTAL;  Surgeon: Sheril Coy, MD;  Location: [facility] ORS;  Service: Orthopedics;  Laterality: Right;  RIGHT KNEE ARTHROPLASTY   VAGINAL HYSTERECTOMY  07/22/1968   WRIST FRACTURE SURGERY Left      Social History:   reports that she quit smoking about 33 years ago. Her smoking use included cigarettes. She started smoking about 78 years ago. She has a 45 pack-year smoking history. She has never used smokeless tobacco. She reports current alcohol  use of about 28.0 standard drinks of alcohol  per week. She reports that she does not use drugs.   Family History:  Her family history includes Asthma in her brother; Breast cancer in her paternal aunt; Cancer in her father; Colon cancer in her mother; Heart disease in her brother; Skin cancer in her brother; Stroke in her sister.   Allergies Allergies  Allergen Reactions   Citalopram  Hydrobromide Other (See Comments)    QT prolongation with Tikosyn    Adhesive [Tape] Swelling and Rash   Bacitracin-Polymyxin B Rash   Benzalkonium Chloride Rash    Pt was not aware of this allergy   Neosporin  [Neomycin-Polymyxin-Gramicidin] Swelling and Rash     Home Medications  Prior to Admission medications   Medication Sig Start Date End Date Taking? Authorizing Provider  acetaminophen  (TYLENOL ) 500 MG tablet Take 500 mg by mouth 2 (two) times daily.   Yes [provider]  alendronate  (FOSAMAX ) 70 MG tablet Take 1 tablet (70 mg total) by mouth every 7 (seven) days. Take with a full glass of water  on an empty stomach. 03/04/17  Yes Kuneff, Renee A, DO  atorvastatin  (LIPITOR) 40 MG tablet Take 40 mg by mouth daily.   Yes [provider]  B Complex-C (B-COMPLEX WITH VITAMIN C) tablet Take 1 tablet by mouth daily.   Yes [provider]  Cholecalciferol  (VITAMIN D3) 125 MCG (5000 UT) TABS Take 5,000 Units by mouth daily.   Yes [provider]  furosemide  (LASIX ) 40 MG  tablet Take 1 tablet (40 mg total) by mouth daily. 03/17/24  Yes Zenaida Morene PARAS, MD  levothyroxine  (SYNTHROID ) 88 MCG tablet Take 88 mcg by mouth daily before breakfast.   Yes [provider]  magnesium  oxide (MAG-OX) 400 MG tablet Take 1 tablet (400 mg total) by mouth daily. 03/21/14  Yes Fernande Elspeth BROCKS, MD  Melatonin 10 MG TABS Take 10-40 mg by mouth at bedtime as needed (sleep).   Yes [provider]  metoprolol  succinate (TOPROL -XL) 50 MG 24 hr tablet Take 50 mg by mouth in the morning and at bedtime. 11/17/23  Yes [provider]  Multiple Vitamin (MULTIVITAMIN WITH MINERALS) TABS tablet Take 1 tablet by mouth daily.   Yes [provider]  Multiple Vitamins-Minerals (PRESERVISION AREDS 2 PO) Take 1 capsule by mouth 2 (two) times daily with a meal.    Yes [provider]  sertraline  (ZOLOFT ) 50 MG tablet Take 1 tablet (50 mg total) by mouth daily. 03/22/19 03/17/25 Yes Dow Arland BROCKS, NP  spironolactone  (ALDACTONE ) 25 MG tablet Take 1 tablet (25 mg total) by mouth daily. 10/14/23  Yes Zenaida Morene PARAS, MD     Critical care time:     CRITICAL  CARE Performed by: Donnice JONELLE Beals   Total critical care time: 40 minutes  Critical care time was exclusive of separately billable procedures and treating other patients.  Critical care was necessary to treat or prevent imminent or life-threatening deterioration.  Critical care was time spent personally by me on the following activities: development of treatment plan with patient and/or surrogate as well as nursing, discussions with consultants, evaluation of patient's response to treatment, examination of patient, obtaining history from patient or surrogate, ordering and performing treatments and interventions, ordering and review of laboratory studies, ordering and review of radiographic studies, pulse oximetry and re-evaluation of patient's condition.   Donnice JONELLE Beals, MD See TRACEY

## 2024-03-18 NOTE — ED Notes (Signed)
 Pt to floor with secondary RN on CCM, pulse ox, and BP monitor.

## 2024-03-18 NOTE — ED Notes (Signed)
 CCMD called and notified

## 2024-03-18 NOTE — ED Notes (Signed)
 Pt to CT with tech.

## 2024-03-18 NOTE — Progress Notes (Signed)
 NAME:  Brittany [name], MRN:  [id number], DOB:  20-May-1934, LOS: 1 ADMISSION DATE:  03/18/2024, CONSULTATION DATE:  03/19/24 REFERRING MD:  ED, CHIEF COMPLAINT:  weakness can not walk   History of Present Illness:  89 y.o. woman with recent admission with traumatic subdural and subarachnoid hemorrhage presented 8/28 with worsening neurologic status, particularly lower extremity weakness with CT images with worsening subarachnoid hemorrhage, intraparenchymal hemorrhage, stable subdural hematoma.  Patient  admitted 8/24 with report of syncope.  Hit head.  CT with subdural hematoma.  And subarachnoid hematoma.  Felt traumatic.  Eliquis  was reversed.  No intervention offered via neurosurgery.  She was observed for a day or 2.  She was instructed to no longer take Eliquis  and she has taken no further Eliquis .  She was discharged home reportedly ambulatory.  On 8/27 day after discharge, stated she could not walk.  Too weak.  Had to get around with wheelchair.  Did not get better.  Prompting ED visit 8/28  CT shows worsening intraparenchymal changes as well as subarachnoid hemorrhage or volume of subarachnoid hemorrhage blood.  With some extension into the ventricles.  Stable hematoma.  Blood pressures noted to be elevated.  Placed on initially diltiazem  with A-fib with RVR.  Switched to clevidipine  given no improvement in blood pressure.  Blood pressure subsequently improved.  Labs notable for worsened hyponatremia from prior, labs otherwise appear at baseline.  Pertinent  Medical History  A-fib previous on Eliquis  this was reversed and subsequently discontinued with recent admission for traumatic subdural and subarachnoid hemorrhage discharge 8/26, hypertension  Significant Hospital Events: Including procedures, antibiotic start and stop dates in addition to other pertinent events   8/28 readmitted w/ worsening weakness, worsening ICH as well as SAH and increased IVH. She was hypertensive and in AF w/  RVR. Placed on clevidipine  for BP control, also on diltiazem  briefly for RVR. Neuro surgery consulted by EDP. Recommended CT angiogram showed sig atherosclerotic disease involving the distal common CA, and w/in the proximal ICA, severe near occlusive stenosis of the distal right common carotid and prox right internal CA. Progressive stenosis of the distal left common carotid artery, now estimated at 50%.Progressive plaque within the left vertebral artery at the V3/V4 junction, now resulting in moderate-to-severe stenosis. 1. No proximal intracranial large vessel occlusion identified.  moderate stenosis of the paraclinoid right ICA. 4. Moderate-to-severe stenosis of the left vertebral artery at the V3/V4 junction.5. Severe stenosis within a left posterior cerebral artery branch at the distal P2 segment level, new from the prior CTA of 12/30/2017. 6. Moderate stenosis within a right posterior cerebral artery branch at the P2/P3 junction, new from the prior CTA. 8/29 declined over night worsening hypoxia. Mental status stable. NIH and neuro status unchanged. Weak ineffective cough, point-of-care ultrasound shows vigorous LV function.  The inferior vena cava has marked respiratory associated collapse suggesting some degree of ongoing volume depletion   Interim History / Subjective:  Marked increased work of breathing ineffective cough very weak  Objective    Blood pressure (!) 153/61, pulse (!) 103, temperature 99.2 F (37.3 C), temperature source Axillary, resp. rate (!) 29, last menstrual period 07/22/1968, SpO2 (!) 86%.    FiO2 (%):  [100 %] 100 %   Intake/Output Summary (Last 24 hours) at 03/19/2024 0759 Last data filed at 03/19/2024 0749 Gross per 24 hour  Intake 1664.21 ml  Output 500 ml  Net 1164.21 ml   There were no vitals filed for this visit.  Examination: General  This is a critically ill 89 year old female she is currently on 100% nonrebreather saturating 87% she is exhibiting  accessory use, diaphragmatic efforts are increased, only speaking in 1 word phrase HEENT left facial droop mucous membranes moist no obvious jugular venous distention Pulmonary diffuse coarse rhonchi throughout with accessory use as mentioned above portable chest x-ray personally reviewed demonstrates worsening bilateral airspace disease Cardiac irregularly irregular, tachycardic, atrial fibrillation with intermittent RVR POC US : Hyperdynamic LV, no pericardial effusion, complete collapse of IVC with respiratory effort Extremities warm, dependent edema, pulses palpable Neuro awake, oriented x 3, left facial droop, right sided extremities are weak with noted drift of upper extremity to gravity GU clear yellow  Resolved problem list   Assessment and Plan   Traumatic intraparenchymal and subarachnoid hemorrhages and IVH w/ worsening ICH,SAH and IVH resulting clinical decline Not surgical candidate Plan Neurosurgery consult, appreciate assistance; currently not stable enough to get f/u scan SBP goal 100-150, clevidipine  Frequent neurochecks Will need PT/OT, likely would benefit from rehabilitation once stable for discharge given no longer ambulatory  Severe carotid artery and cerebral artery stenosis  Plan Would avoid dropping SBP < 100 Unfortunately can't have antiplatelets w/ ICH Will see how hospital course goes, ? Referral to neuro vascular team post recovery?  Depending on her clinical course over the next 24 to 48 hours this may be a moot point  Acute hypoxic respiratory failure  Sig increase in O2 need and increased resp distress. Weak cough, seen earlier by SLP felt high asp risk which I suspect is culprit Plan  Cxr now r/o asp  NPO Cont supplemental oxygen, Could consider heated high flow but not NIPPV candidate d/t cough mechanics  Cont pulse ox Sat goal 92%  Prob avoid NTS given risk for increased ICP Start unasyn  Trend PCT  History of hypertension: Seems relatively  mild. plan Metoprolol  & spironolactone  Clevidipine  as above for SBP goal less than 150 Will hold off on IV Lasix , IVC evaluation suggests that she is not I am overloaded  Aspiration pneumonia, leukocytosis w/ SIRS and concern for evolving sepsis Plan Keep euvolemic  She's currently hypertensive but this could change Abx as above Trend CBC  Mild elevated trop I  Plan Not candidate for cardiac intervention Resume medical therapy when able   Atrial fibrillation: still w/ intermittent RVR Her wob likely contributing plan Tele Add IV low dose metoprolol   hold anticoagulation  Hyponatremia  Slightly improved w/ NaCl Plan Keep euvolemic, she could probably use more volume hemodynamically but from a pulmonary standpoint would prefer to limit additional resuscitation efforts for now Will repeat urine studies this am; urine Na did not really suggest hypovolemia   Hypothyroidism  Plan Change to IV because of NPO status  Code status Plan DNR/DNI  I will plan on meeting with the family as soon as they arrive, we will place her on heated high flow oxygen, however if she could declines further in spite of all of the above efforts I think we will need to transition to comfort care Critical care time: 45 minutes    CRITICAL CARE Performed by: Jeralyn FORBES Banner

## 2024-03-19 ENCOUNTER — Inpatient Hospital Stay (HOSPITAL_COMMUNITY)

## 2024-03-19 DIAGNOSIS — J690 Pneumonitis due to inhalation of food and vomit: Secondary | ICD-10-CM | POA: Diagnosis not present

## 2024-03-19 DIAGNOSIS — I609 Nontraumatic subarachnoid hemorrhage, unspecified: Secondary | ICD-10-CM | POA: Diagnosis not present

## 2024-03-19 DIAGNOSIS — J9601 Acute respiratory failure with hypoxia: Secondary | ICD-10-CM | POA: Diagnosis not present

## 2024-03-19 DIAGNOSIS — E871 Hypo-osmolality and hyponatremia: Secondary | ICD-10-CM

## 2024-03-19 DIAGNOSIS — R7989 Other specified abnormal findings of blood chemistry: Secondary | ICD-10-CM

## 2024-03-19 LAB — BASIC METABOLIC PANEL WITH GFR
Anion gap: 11 (ref 5–15)
BUN: 9 mg/dL (ref 8–23)
CO2: 20 mmol/L — ABNORMAL LOW (ref 22–32)
Calcium: 10.2 mg/dL (ref 8.9–10.3)
Chloride: 96 mmol/L — ABNORMAL LOW (ref 98–111)
Creatinine, Ser: 0.51 mg/dL (ref 0.44–1.00)
GFR, Estimated: >60 mL/min (ref >60)
Glucose, Bld: 184 mg/dL — ABNORMAL HIGH (ref 70–99)
Potassium: 4 mmol/L (ref 3.5–5.1)
Sodium: 127 mmol/L — ABNORMAL LOW (ref 135–145)

## 2024-03-19 LAB — URINALYSIS, ROUTINE W REFLEX MICROSCOPIC
Bilirubin Urine: NEGATIVE
Glucose, UA: >=500 mg/dL — AB
Hgb urine dipstick: NEGATIVE
Ketones, ur: NEGATIVE mg/dL
Leukocytes,Ua: NEGATIVE
Nitrite: NEGATIVE
Protein, ur: 30 mg/dL — AB
Specific Gravity, Urine: 1.02 (ref 1.005–1.030)
pH: 7 (ref 5.0–8.0)

## 2024-03-19 LAB — BRAIN NATRIURETIC PEPTIDE: B Natriuretic Peptide: 240.4 pg/mL — ABNORMAL HIGH (ref 0.0–100.0)

## 2024-03-19 LAB — CBC
HCT: 42.1 % (ref 36.0–46.0)
Hemoglobin: 14 g/dL (ref 12.0–15.0)
MCH: 30.6 pg (ref 26.0–34.0)
MCHC: 33.3 g/dL (ref 30.0–36.0)
MCV: 91.9 fL (ref 80.0–100.0)
Platelets: 251 K/uL (ref 150–400)
RBC: 4.58 MIL/uL (ref 3.87–5.11)
RDW: 15.8 % — ABNORMAL HIGH (ref 11.5–15.5)
WBC: 20.5 K/uL — ABNORMAL HIGH (ref 4.0–10.5)
nRBC: 0 % (ref 0.0–0.2)

## 2024-03-19 LAB — LACTIC ACID, PLASMA: Lactic Acid, Venous: 2.5 mmol/L (ref 0.5–1.9)

## 2024-03-19 LAB — MAGNESIUM: Magnesium: 1.7 mg/dL (ref 1.7–2.4)

## 2024-03-19 LAB — CULTURE, BLOOD (ROUTINE X 2)
Culture: NO GROWTH
Culture: NO GROWTH
Special Requests: ADEQUATE
Special Requests: ADEQUATE

## 2024-03-19 LAB — URIC ACID, RANDOM URINE: Uric Acid, Urine: 33.9 mg/dL

## 2024-03-19 LAB — SODIUM, URINE, RANDOM: Sodium, Ur: 81 mmol/L

## 2024-03-19 LAB — URIC ACID: Uric Acid, Serum: 4.2 mg/dL (ref 2.5–7.1)

## 2024-03-19 LAB — PROCALCITONIN: Procalcitonin: <0.1 ng/mL

## 2024-03-19 LAB — OSMOLALITY: Osmolality: 280 mosm/kg (ref 275–295)

## 2024-03-19 LAB — OSMOLALITY, URINE: Osmolality, Ur: 536 mosm/kg (ref 300–900)

## 2024-03-19 MED ORDER — MAGNESIUM SULFATE 2 GM/50ML IV SOLN
2.0000 g | Freq: Once | INTRAVENOUS | Status: AC
  Administered 2024-03-19: 2 g via INTRAVENOUS
  Filled 2024-03-19: qty 50

## 2024-03-19 MED ORDER — LEVOTHYROXINE SODIUM 100 MCG/5ML IV SOLN: 44.0000 ug | Freq: Every day | INTRAVENOUS | Status: DC

## 2024-03-19 MED ORDER — MORPHINE SULFATE (PF) 2 MG/ML IV SOLN
1.0000 mg | INTRAVENOUS | Status: DC | PRN
  Administered 2024-03-19 (×2): 1 mg via INTRAVENOUS
  Filled 2024-03-19 (×2): qty 1

## 2024-03-19 MED ORDER — SODIUM CHLORIDE 0.9 % IV SOLN
3.0000 g | Freq: Four times a day (QID) | INTRAVENOUS | Status: DC
  Administered 2024-03-19 – 2024-03-20 (×3): 3 g via INTRAVENOUS
  Filled 2024-03-19 (×3): qty 8

## 2024-03-19 MED ORDER — POLYVINYL ALCOHOL 1.4 % OP SOLN: 1.0000 [drp] | Freq: Four times a day (QID) | OPHTHALMIC | Status: DC | PRN

## 2024-03-19 MED ORDER — GLYCOPYRROLATE 0.2 MG/ML IJ SOLN: 0.1000 mg | Freq: Once | INTRAMUSCULAR | Status: DC

## 2024-03-19 MED ORDER — FUROSEMIDE 10 MG/ML IJ SOLN
80.0000 mg | Freq: Once | INTRAMUSCULAR | Status: AC
  Administered 2024-03-19: 80 mg via INTRAVENOUS
  Filled 2024-03-19: qty 8

## 2024-03-19 MED ORDER — METOPROLOL TARTRATE 5 MG/5ML IV SOLN
2.5000 mg | Freq: Four times a day (QID) | INTRAVENOUS | Status: DC
  Administered 2024-03-19 (×2): 2.5 mg via INTRAVENOUS
  Filled 2024-03-19: qty 5

## 2024-03-19 MED ORDER — SODIUM CHLORIDE 0.9 % IV SOLN: INTRAVENOUS | Status: DC

## 2024-03-19 MED ORDER — GLYCOPYRROLATE 0.2 MG/ML IJ SOLN
0.1000 mg | Freq: Every day | INTRAMUSCULAR | Status: DC | PRN
  Administered 2024-03-19: 0.1 mg via INTRAVENOUS
  Filled 2024-03-19: qty 1

## 2024-03-19 NOTE — Progress Notes (Addendum)
 Patient maintaining saturation of upper 80's on 100% HHFNC and NRB. CCM updated and coming to bedside to update family on prognosis.   Update as of 1445: family spoke with CCM pete babcock; chosen to move to comfort care. Waiting for rest of family to arrive. PRN medication available as needed and family educated.  Support provided.

## 2024-03-19 NOTE — Progress Notes (Signed)
 Spoke with son [name] about patient's condition and that patient's breathing was sounding worse and she now needed a nonrebreather mask to maintain oxygenation status. Sone and husband plan to visit this morning.

## 2024-03-19 NOTE — Progress Notes (Signed)
 OT Cancellation Note  Patient Details Name: ATAVIA POPPE MRN: [id number] DOB: 1934/02/03   Cancelled Treatment:    Reason Eval/Treat Not Completed: Medical issues which prohibited therapy.  Status changes, OT will check back as appropriate.    Dreana Britz D Lenola Lockner 03/19/2024, 10:07 AM 03/19/2024  RP, OTR/L  Acute Rehabilitation Services  Office:  [phone]

## 2024-03-19 NOTE — Progress Notes (Signed)
 Pharmacy Electrolyte Replacement  Recent Labs:  Recent Labs    03/19/24 0552  K 4.0  MG 1.7  CREATININE 0.51    Low Critical Values (K </= 2.5, Phos </= 1, Mg </= 1) Present: None  MD Contacted: n/a  Plan: Magnesium  sulfate IV 2g x1 per protocol   Rankin Sams, PharmD, BCPS, BCCCP Clinical Pharmacist

## 2024-03-19 NOTE — Progress Notes (Signed)
 SLP Cancellation Note  Patient Details Name: Brittany [name] MRN: [id number] DOB: [birth year]   Cancelled treatment:        Pt's respiratory status declined overnight and not on non rebreather and HFNC sating 89%. RN confirmed MBS cancelled for today. ST will follow progress.    Dustin Olam Bull 03/19/2024, 10:03 AM

## 2024-03-19 NOTE — Progress Notes (Addendum)
 Updated from night shift RN of respiratory distress overnight. Updated CCNP Jeralyn Banner of condition and decision was made to place patient on HHFNC at this time. Patient becoming more lethargic and states to this RN I am just so tired. Patients family updated and at bedside.

## 2024-03-19 NOTE — Progress Notes (Signed)
 SLP Cancellation Note  Patient Details Name: DASHONNA CHAGNON MRN: [id number] DOB: 04-19-1934   Cancelled treatment:       Reason Eval/Treat Not Completed: Note pt now receiving full comfort measures. SLP will sign off at this time but please re-consult if acute needs arise.    Damien Blumenthal, M.A., CCC-SLP Speech Language Pathology, Acute Rehabilitation Services  Secure Chat preferred [phone]  03/19/2024, 5:05 PM

## 2024-03-19 NOTE — Progress Notes (Signed)
 Afternoon follow-up:  Patient is continued to decline over the course of the day for work of breathing is worse in spite of nonrebreather and heated high flow.  Grunting, significant worsening of her accessory use, still will wake up communication even more labored.  Family is at bedside.  I really do not have anything else to offer other than comfort at this point and family is in complete agreement with this.  Plan Will continue current supplemental oxygen as she has now given her extraordinary work of breathing Will give a one-time dose of Lasix  to see if mobilization of lung water  helps Will start low-dose morphine  at 1 mg every 2 hours as needed, I anticipate she will require higher dosing however really just want to see if we can make some progress in her work of breathing I anticipate will need to transition to a drip later today We are awaiting additional family members who I anticipate will be here later this afternoon I am initiating comfort care measures

## 2024-03-19 NOTE — Progress Notes (Signed)
 PT Cancellation Note  Patient Details Name: Brittany [name] MRN: [id number] DOB: July 08, 1934   Cancelled Treatment:    Reason Eval/Treat Not Completed: Patient not medically ready - on NRB and HHFNC satting 89%, PT to check back when medically ready.   Brittany [name], PT DPT Acute Rehabilitation Services Secure Chat Preferred  Office [phone]    Brittany [name] 03/19/2024, 9:35 AM

## 2024-03-20 DIAGNOSIS — I619 Nontraumatic intracerebral hemorrhage, unspecified: Principal | ICD-10-CM | POA: Diagnosis present

## 2024-03-20 DIAGNOSIS — I615 Nontraumatic intracerebral hemorrhage, intraventricular: Secondary | ICD-10-CM

## 2024-03-20 DIAGNOSIS — Z515 Encounter for palliative care: Secondary | ICD-10-CM

## 2024-03-20 DIAGNOSIS — G935 Compression of brain: Secondary | ICD-10-CM | POA: Diagnosis present

## 2024-03-20 DIAGNOSIS — Z66 Do not resuscitate: Secondary | ICD-10-CM | POA: Diagnosis present

## 2024-03-20 LAB — URIC ACID, RANDOM URINE: Uric Acid, Urine: 46 mg/dL

## 2024-03-20 MED ORDER — ACETAMINOPHEN 650 MG RE SUPP: 650.0000 mg | Freq: Four times a day (QID) | RECTAL | Status: DC | PRN

## 2024-03-20 MED ORDER — GLYCOPYRROLATE 0.2 MG/ML IJ SOLN: 0.2000 mg | INTRAMUSCULAR | Status: DC | PRN

## 2024-03-20 MED ORDER — SODIUM CHLORIDE 0.9 % IV SOLN: INTRAVENOUS | Status: DC

## 2024-03-20 MED ORDER — ACETAMINOPHEN 325 MG PO TABS
650.0000 mg | ORAL_TABLET | Freq: Four times a day (QID) | ORAL | Status: DC | PRN
Start: 2024-03-20 — End: 2024-03-20

## 2024-03-20 MED ORDER — MORPHINE 100MG IN NS 100ML (1MG/ML) PREMIX INFUSION
0.0000 mg/h | INTRAVENOUS | Status: DC
  Administered 2024-03-20: 5 mg/h via INTRAVENOUS
  Filled 2024-03-20: qty 100

## 2024-03-20 MED ORDER — GLYCOPYRROLATE 1 MG PO TABS: 1.0000 mg | ORAL_TABLET | ORAL | Status: DC | PRN

## 2024-03-20 MED ORDER — LORAZEPAM 2 MG/ML IJ SOLN: 2.0000 mg | INTRAMUSCULAR | Status: DC | PRN

## 2024-03-20 MED ORDER — HALOPERIDOL LACTATE 5 MG/ML IJ SOLN: 2.5000 mg | INTRAMUSCULAR | Status: DC | PRN

## 2024-03-20 MED ORDER — MORPHINE BOLUS VIA INFUSION: 5.0000 mg | INTRAVENOUS | Status: DC | PRN

## 2024-03-22 NOTE — Progress Notes (Signed)
   2024-04-09 0300  Attending Physican Contact  Attending Physician Notified Y  Attending Physician (First and Last Name) Ogan, Okoronkwo U, MD  Post Mortem Checklist  Date of Death 04/09/2024  Time of Death 0229  Pronounced By Quintin obie armin Delon RN  Next of kin notified Yes  Name of next of kin notified of death Tammatha Cobb  Contact Person's Relationship to Patient Spouse  Contact Person's Phone Number [phone]  Family Communication Notes Was present at bedside  Was the patient a No Code Blue or a Limited Code Blue? No  Did the patient die unattended? No  Patient restrained (physical/manual hold/chemical)? *See row information* Not applicable  [facility] (previously known as [facility])  Notification Date 2024-04-09  Notification Time 0300  [facility] Number [phone]  Is patient a potential donor? N  Autopsy  Autopsy requested by MD or Family ( Non ME Case) N/A  Patient and Hospital Property Returned  Patient is satisfied that all belongings have been returned? Not applicable  Dermatherapy linen/gowns NOT sent with patient or transporter Not applicable  Dead on Arrival (Emergency Department)  Patient dead on arrival? No  Notifications  Patient Placement notified that Post Mortem checklist is complete Yes  Patient Placement notified body transferred Transported to [email or web address]  Medical Examiner  Is this a medical examiner's case? N  Funeral Home  Funeral home name/address/phone #  (Whole body donation, [employer] [employer] [facility] [employer]. [place], [place] [address]. Weekend number: [phone], [employer] weekday: [phone].)  Planned location of pickup [facility]

## 2024-03-22 NOTE — Progress Notes (Signed)
 Pt removed from [facility] and [facility] per comfort care protocol. RT, RN, and Family at bedside.

## 2024-03-22 NOTE — Progress Notes (Signed)
 Patient and family have instructions post-mortem:   Whole body donation with, [employer] [employer] [facility] [employer].   Please call either number for notification of death.  [place], [place] [address]. [phone].  [employer] [employer] [facility] [phone].    Attempted to call without success.

## 2024-03-22 NOTE — Progress Notes (Signed)
 Spoke with Dr. Vinie Law [phone], at [facility], the family must coordinate transportation.   They will accept the donation.

## 2024-03-22 NOTE — Progress Notes (Signed)
 eLink Physician-Brief Progress Note Patient Name: Brittany [name] DOB: 04-01-1934 MRN: [id number]   Date of Service  April 01, 2024  HPI/Events of Note  Family assembled in the room and ready to proceed with full comfort measures as previously discussed with the daytime PCCM team.  eICU Interventions  Comfort Measures order set completed.        [name] 01-Apr-2024, 1:16 AM

## 2024-03-22 NOTE — Death Summary Note (Signed)
 DEATH SUMMARY   Patient Details  Name: Brittany [name] MRN: [id number] DOB: Jul 28, 1933  Admission/Discharge Information   Admit Date:  Apr 17, 2024  Date of Death: Date of Death: 04-19-2024  Time of Death: Time of Death: 0229  Length of Stay: 2  Referring Physician: Sun, Vyvyan, MD   Reason(s) for Hospitalization  Brain bleed  Diagnoses  Preliminary cause of death: Brain bleed Secondary Diagnoses (including complications and co-morbidities):  Principal Problem:   Subarachnoid bleed [facility]) Active Problems:   Intraparenchymal hemorrhage of brain (HCC)   Intraventricular hemorrhage (HCC)   Brain compression (HCC)   DNR (do not resuscitate)   Comfort measures only status   Brief Hospital Course (including significant findings, care, treatment, and services provided and events leading to death)  Brittany [name] is a 89 y.o. year old female who had recent mission for traumatic subdural and small subarachnoid hemorrhage.  Was discharged.  The day after discharge she could no longer ambulate.  Change from baseline.  Needed wheelchair.  Did not improve over the course of the day.  Presented to the ED.  CT scan notable for worsening intracranial hemorrhage, worsening subarachnoid hemorrhage, intraventricular extension.  She confirmed DNR.  She was mated to the ICU for close monitoring and blood pressure control.  Despite aggressive measures, she had progressive neurologic decline.  Likely began aspirating not handling secretions.  Goals of care were had and family elected to focus on comfort measures.  She subsequently passed away surrounded by loved ones.    Pertinent Labs and Studies  Significant Diagnostic Studies DG Chest Port 1 View Result Date: 03/19/2024 CLINICAL DATA:  Respiratory failure. EXAM: PORTABLE CHEST 1 VIEW COMPARISON:  04/17/24. FINDINGS: Stable cardiomediastinal contours. Stable dual lead pacemaker. Similar mild left basilar streaky opacity which could reflect atelectasis  and/or infiltrate. No sizeable pleural effusion or pneumothorax. Visualized osseous structures are unchanged. IMPRESSION: Similar left basilar streaky opacity which could reflect atelectasis and/or infiltrate. Electronically Signed   By: Harrietta Sherry M.D.   On: 03/19/2024 09:28   DG CHEST PORT 1 VIEW Result Date: April 17, 2024 CLINICAL DATA:  Aspiration into airway. EXAM: PORTABLE CHEST 1 VIEW COMPARISON:  2024/04/17 FINDINGS: There is stable dual lead AICD positioning. The cardiac silhouette is mildly enlarged and unchanged in size. Mild to moderate severity calcification of the aortic arch is noted. Mild, stable left basilar atelectasis and/or infiltrate is seen. No pleural effusion or pneumothorax is identified. Multilevel degenerative changes are noted throughout the thoracic spine. IMPRESSION: Stable cardiomegaly with mild, stable left basilar atelectasis and/or infiltrate. Electronically Signed   By: Suzen Dials M.D.   On: 2024-04-17 19:21   CT ANGIO HEAD NECK W WO CM Result Date: 04/17/2024 CLINICAL DATA:  Provided history: Stroke/TIA, determine embolic source. EXAM: CT ANGIOGRAPHY HEAD AND NECK WITH AND WITHOUT CONTRAST TECHNIQUE: Multidetector CT imaging of the head and neck was performed using the standard protocol during bolus administration of intravenous contrast. Multiplanar CT image reconstructions and MIPs were obtained to evaluate the vascular anatomy. Carotid stenosis measurements (when applicable) are obtained utilizing NASCET criteria, using the distal internal carotid diameter as the denominator. RADIATION DOSE REDUCTION: This exam was performed according to the departmental dose-optimization program which includes automated exposure control, adjustment of the mA and/or kV according to patient size and/or use of iterative reconstruction technique. CONTRAST:  75mL OMNIPAQUE  IOHEXOL  350 MG/ML SOLN COMPARISON:  Non-contrast head CT performed earlier today 17-Apr-2024. CTA head/neck  12/30/2017. FINDINGS: CTA NECK FINDINGS Aortic arch: Standard aortic  branching. Atherosclerotic plaque within the aortic arch and proximal major branch vessels of the neck. No hemodynamically significant innominate or proximal subclavian artery stenosis. Right carotid system: CCA and ICA patent within the neck. Atherosclerotic plaque. Most notably, there is progressive bulky (predominantly calcified) plaque within the distal CCA, about the carotid bifurcation and within the proximal ICA. Resultant severe, near occlusive stenoses within the distal CCA and proximal ICA. Left carotid system: CCA and ICA patent within the neck. Atherosclerotic plaque. Most notably, there is progressive predominantly calcified plaque within the distal CCA, about the carotid bifurcation and within the proximal ICA. Estimated 50% stenosis of the distal CCA (relative to the ICA). Less than 50% stenosis of the proximal ICA. Vertebral arteries: Codominant and patent within the neck. Atherosclerotic plaque within both vessels, most notably as follows. Unchanged severe atherosclerotic narrowing of the right vertebral artery origin. Progressive moderate to severe atherosclerotic stenosis within the left vertebral artery at the V3/V4 junction. Additionally, the bilateral vertebral arteries are moderately narrowed at the C4-C5 level due to degenerative bony spurring. Skeleton: Spondylosis at the cervical and visible upper thoracic levels. Multilevel ventral osteophytes (some of which are bridging at the upper thoracic levels). No acute fracture or aggressive osseous lesion. Other neck: Subcentimeter right thyroid  lobe nodule not meeting consensus criteria for ultrasound follow-up based on size. No follow-up imaging recommended. Reference: J Am Coll Radiol. 2015 Feb;12(2): 143-50. Upper chest: No consolidation within the imaged lung apices. Review of the MIP images confirms the above findings CTA HEAD FINDINGS Anterior circulation: The intracranial  internal carotid arteries are patent. Atherosclerotic plaque within both vessels. Most notably, there is up to moderate stenosis of the right paraclinoid segment. The M1 middle cerebral arteries are patent. Atherosclerotic irregularity of the M2 and more distal MCA branches, bilaterally. No M2 proximal branch occlusion or high-grade proximal stenosis. The anterior cerebral arteries are patent. No intracranial aneurysm is identified. Posterior circulation: The intracranial vertebral arteries are patent. Calcified atherosclerotic plaque within the left vertebral artery at the V3/V4 junction resulting in moderate-to-severe stenosis. The basilar artery is patent. The posterior cerebral arteries are patent. Atherosclerotic irregularity of both vessels. Most notably, there is a severe stenosis within a left PCA branch at the distal P2 segment level (series 12, image 31) and a moderate stenosis within a right PCA branch at the P2/P3 junction (series 12, image 23). These stenoses are new from the prior CTA of 12/30/2017. A right posterior communicating artery is present. The left posterior communicating artery is diminutive or absent. Venous sinuses: Contrast timing limits evaluation for dural venous sinus thrombosis. Anatomic variants: As described. Review of the MIP images confirms the above findings IMPRESSION: CTA neck: 1. The common carotid and internal carotid arteries are patent within the neck. 2. Progressive bulky atherosclerotic plaque within the distal common carotid artery, about the carotid bifurcation and within the proximal ICA on the right. Progressive severe, near-occlusive stenosis of the distal right common carotid artery and proximal right internal carotid artery. 3. Progressive atherosclerotic plaque within the distal common carotid artery, about the carotid bifurcation and within the proximal ICA on the left. Progressive stenosis of the distal left common carotid artery, now estimated at 50%. 4.  Vertebral arteries patent within the neck. As before, there is severe atherosclerotic narrowing of the right vertebral artery origin. Progressive plaque within the left vertebral artery at the V3/V4 junction, now resulting in moderate-to-severe stenosis. Additionally, the vertebral arteries are moderately narrowed bilaterally at the C4-C5 level due to degenerative bony  spurring. 5. Aortic Atherosclerosis (ICD10-I70.0). CTA head: 1. No proximal intracranial large vessel occlusion identified. 2. Intracranial atherosclerotic disease with multifocal stenoses, most notably as follows. 3. Up to moderate stenosis of the paraclinoid right ICA. 4. Moderate-to-severe stenosis of the left vertebral artery at the V3/V4 junction. 5. Severe stenosis within a left posterior cerebral artery branch at the distal P2 segment level, new from the prior CTA of 12/30/2017. 6. Moderate stenosis within a right posterior cerebral artery branch at the P2/P3 junction, new from the prior CTA. Electronically Signed   By: Rockey Childs D.O.   On: 03/18/2024 14:32   DG Chest Portable 1 View Result Date: 03/18/2024 CLINICAL DATA:  Weakness. EXAM: PORTABLE CHEST 1 VIEW COMPARISON:  March 14, 2024. FINDINGS: Stable cardiomediastinal silhouette. Left-sided pacemaker is unchanged. Lungs are clear. Bony thorax is unremarkable. IMPRESSION: No active disease. Electronically Signed   By: Lynwood Landy Raddle M.D.   On: 03/18/2024 12:23   CT Head Wo Contrast Result Date: 03/18/2024 CLINICAL DATA:  Headache, neuro deficit EXAM: CT HEAD WITHOUT CONTRAST TECHNIQUE: Contiguous axial images were obtained from the base of the skull through the vertex without intravenous contrast. RADIATION DOSE REDUCTION: This exam was performed according to the departmental dose-optimization program which includes automated exposure control, adjustment of the mA and/or kV according to patient size and/or use of iterative reconstruction technique. COMPARISON:  CT head 03/14/2024  FINDINGS: Brain: Increased size of the extra-axial collection left cerebellopontine angle with mass effect upon the pons. Suspected intraparenchymal hemorrhage within the pons possibly extending to the fourth ventricle. This measures 2.5 x 2.2 cm in the axial plane (series 2, image 8) compared to 1.5 x 1.8 cm on 03/14/2024. Increased subarachnoid hemorrhage along the left cerebellar hemisphere and in the perimesencephalic cisterns. New subarachnoid hemorrhage along the left sylvian fissure (series 2, image 17). New layering intraventricular hemorrhage in the occipital horns of the lateral ventricles. 4 mm para falcine subdural hematoma is unchanged. No midline shift. Chronic atrophy and small vessel ischemic disease. Vascular: No hyperdense vessel. Intracranial arterial calcification. Skull: No fracture or focal lesion. Sinuses/Orbits: No acute finding. Other: None. IMPRESSION: 1. Increased size of the extra-axial collection in the left cerebellopontine angle with mass effect upon the pons. 2. Suspected intraparenchymal hemorrhage within the pons possibly extending to the fourth ventricle. 3. Increased subarachnoid hemorrhage along the left cerebellar hemisphere, in the perimesencephalic cisterns, and subarachnoid hemorrhage along the left Sylvian fissure. 4. New layering intraventricular hemorrhage in the occipital horns of the lateral ventricles. 5. 4 mm para falcine subdural hematoma is unchanged. Critical Value/emergent results were called by telephone at the time of interpretation on 03/18/2024 at 11:28 am to provider GLENDIA BREEDING , who verbally acknowledged these results. Electronically Signed   By: Norman Gatlin M.D.   On: 03/18/2024 11:37   ECHOCARDIOGRAM COMPLETE BUBBLE STUDY Result Date: 03/15/2024    ECHOCARDIOGRAM REPORT   Patient Name:   SHELLIA HARTL [facility] Date of Exam: 03/15/2024 Medical Rec #:  [id number]       Height:       68.0 in Accession #:    [id number]      Weight:       205.0 lb Date of  Birth:  30-Nov-1933       BSA:          2.065 m Patient Age:    89 years        BP:           159/67 mmHg Patient  Gender: F               HR:           53 bpm. Exam Location:  Inpatient Procedure: 2D Echo, Cardiac Doppler and Color Doppler (Both Spectral and Color            Flow Doppler were utilized during procedure). Indications:    R55 Syncope  History:        Patient has prior history of Echocardiogram examinations, most                 recent 12/02/2022. Abnormal ECG and Pacemaker, Arrythmias:Atrial                 Fibrillation, Signs/Symptoms:Dyspnea and Shortness of Breath;                 Risk Factors:Hypertension and Dyslipidemia.  Sonographer:    Ellouise Mose RDCS Referring Phys: [id number] [facility]  Sonographer Comments: Technically difficult study due to poor echo windows and patient is obese. Image acquisition challenging due to patient body habitus. IMPRESSIONS  1. Left ventricular ejection fraction, by estimation, is 55 to 60%. The left ventricle has normal function. The left ventricle has no regional wall motion abnormalities. There is mild concentric left ventricular hypertrophy. Left ventricular diastolic function could not be evaluated.  2. Right ventricular systolic function is mildly reduced. The right ventricular size is normal. The estimated right ventricular systolic pressure is 40.7 mmHg.  3. Right atrial size was mildly dilated.  4. Moderate pleural effusion in the left lateral region.  5. The mitral valve is normal in structure. Mild mitral valve regurgitation. No evidence of mitral stenosis.  6. Tricuspid valve regurgitation is moderate.  7. The aortic valve is tricuspid. Aortic valve regurgitation is mild. Aortic valve sclerosis/calcification is present, without any evidence of aortic stenosis.  8. Aortic dilatation noted. There is mild dilatation of the ascending aorta, measuring 40 mm.  9. The inferior vena cava is dilated in size with >50% respiratory variability, suggesting right atrial  pressure of 8 mmHg. 10. Agitated saline contrast bubble study was negative, with no evidence of any interatrial shunt. FINDINGS  Left Ventricle: Left ventricular ejection fraction, by estimation, is 55 to 60%. The left ventricle has normal function. The left ventricle has no regional wall motion abnormalities. The left ventricular internal cavity size was normal in size. There is  mild concentric left ventricular hypertrophy. Left ventricular diastolic function could not be evaluated. Right Ventricle: The right ventricular size is normal. No increase in right ventricular wall thickness. Right ventricular systolic function is mildly reduced. The tricuspid regurgitant velocity is 2.86 m/s, and with an assumed right atrial pressure of 8 mmHg, the estimated right ventricular systolic pressure is 40.7 mmHg. Left Atrium: Left atrial size was normal in size. Right Atrium: Right atrial size was mildly dilated. Pericardium: There is no evidence of pericardial effusion. Mitral Valve: The mitral valve is normal in structure. Mild mitral valve regurgitation. No evidence of mitral valve stenosis. Tricuspid Valve: The tricuspid valve is normal in structure. Tricuspid valve regurgitation is moderate . No evidence of tricuspid stenosis. Aortic Valve: The aortic valve is tricuspid. Aortic valve regurgitation is mild. Aortic valve sclerosis/calcification is present, without any evidence of aortic stenosis. Pulmonic Valve: The pulmonic valve was normal in structure. Pulmonic valve regurgitation is not visualized. No evidence of pulmonic stenosis. Aorta: Aortic dilatation noted. There is mild dilatation of the ascending aorta, measuring 40 mm. Venous: The inferior  vena cava is dilated in size with greater than 50% respiratory variability, suggesting right atrial pressure of 8 mmHg. IAS/Shunts: No atrial level shunt detected by color flow Doppler. Agitated saline contrast was given intravenously to evaluate for intracardiac shunting.  Agitated saline contrast bubble study was negative, with no evidence of any interatrial shunt. Additional Comments: A device lead is visualized. There is a moderate pleural effusion in the left lateral region.  LEFT VENTRICLE PLAX 2D LVIDd:         4.90 cm LVIDs:         3.30 cm LV PW:         1.30 cm LV IVS:        1.20 cm LVOT diam:     2.30 cm LV SV:         107 LV SV Index:   52 LVOT Area:     4.15 cm  LV Volumes (MOD) LV vol d, MOD A2C: 101.0 ml LV vol d, MOD A4C: 115.0 ml LV vol s, MOD A2C: 40.7 ml LV vol s, MOD A4C: 40.9 ml LV SV MOD A2C:     60.3 ml LV SV MOD A4C:     115.0 ml LV SV MOD BP:      70.2 ml RIGHT VENTRICLE            IVC RV S prime:     8.38 cm/s  IVC diam: 2.30 cm TAPSE (M-mode): 1.3 cm LEFT ATRIUM             Index        RIGHT ATRIUM           Index LA diam:        5.70 cm 2.76 cm/m   RA Area:     22.10 cm LA Vol (A2C):   69.3 ml 33.55 ml/m  RA Volume:   65.20 ml  31.57 ml/m LA Vol (A4C):   61.3 ml 29.68 ml/m LA Biplane Vol: 67.3 ml 32.58 ml/m  AORTIC VALVE LVOT Vmax:   123.00 cm/s LVOT Vmean:  79.100 cm/s LVOT VTI:    0.258 m  AORTA Ao Root diam: 3.20 cm Ao Asc diam:  3.95 cm MITRAL VALVE                TRICUSPID VALVE MV Area (PHT): 4.31 cm     TR Peak grad:   32.7 mmHg MV Decel Time: 176 msec     TR Vmax:        286.00 cm/s MV E velocity: 100.00 cm/s                             SHUNTS                             Systemic VTI:  0.26 m                             Systemic Diam: 2.30 cm Wilbert Bihari MD Electronically signed by Wilbert Bihari MD Signature Date/Time: 03/15/2024/4:33:00 PM    Final    EEG adult Result Date: 03/15/2024 Shelton Arlin KIDD, MD     03/15/2024 11:31 AM Patient Name: PORTER NAKAMA MRN: [id number] Epilepsy Attending: Arlin KIDD Shelton Referring Physician/Provider: Georgina Basket, MD Date: 03/15/2024 Duration: 22.52 mins Patient history: 89yo F with syncope. EEG to evaluate for  seizure Level of alertness: Awake AEDs during EEG study: None Technical aspects: This EEG  study was done with scalp electrodes positioned according to the 10-20 International system of electrode placement. Electrical activity was reviewed with band pass filter of 1-70Hz , sensitivity of 7 uV/mm, display speed of 70mm/sec with a 60Hz  notched filter applied as appropriate. EEG data were recorded continuously and digitally stored.  Video monitoring was available and reviewed as appropriate. Description: The posterior dominant rhythm consists of 9 Hz activity of moderate voltage (25-35 uV) seen predominantly in posterior head regions, symmetric and reactive to eye opening and eye closing. EEG showed intermittent generalized 3 to 5 Hz theta-delta slowing. Hyperventilation and photic stimulation were not performed.   ABNORMALITY - Intermittent slow, generalized IMPRESSION: This study is suggestive of mild diffuse encephalopathy. No seizures or epileptiform discharges were seen throughout the recording. Priyanka MALVA Krebs   CT HEAD WO CONTRAST ( ) Result Date: 03/14/2024 EXAM: CT HEAD WITHOUT CONTRAST 03/14/2024 05:04:50 PM TECHNIQUE: CT of the head was performed without the administration of intravenous contrast. Automated exposure control, iterative reconstruction, and/or weight based adjustment of the mA/kV was utilized to reduce the radiation dose to as low as reasonably achievable. COMPARISON: 03/14/2024 09:11 AM CLINICAL HISTORY: Subdural hematoma. FINDINGS: BRAIN AND VENTRICLES: No acute hemorrhage. Gray-white differentiation is preserved. No hydrocephalus. No extra-axial collection. No mass effect or midline shift. Unchanged appearance of extra-axial hematoma at the left cerebellopontine angle. 4 mm parafalcine subdural hematoma is also unchanged. Moderate chronic ischemic white matter changes. Generalized volume loss. ORBITS: No acute abnormality. SINUSES: No acute abnormality. SOFT TISSUES AND SKULL: No acute soft tissue abnormality. No skull fracture. IMPRESSION: 1. Unchanged appearance of  extra-axial hematoma at the left cerebellopontine angle and 4 mm parafalcine subdural hematoma. 2. Moderate chronic ischemic white matter changes. 3. Generalized volume loss. Electronically signed by: Franky Stanford MD 03/14/2024 05:15 PM EDT RP Workstation: [id number]   CT Head Wo Contrast Addendum Date: 03/14/2024 ADDENDUM REPORT: 03/14/2024 09:53 ADDENDUM: Study discussed by telephone with Dr. CARON SALT on 03/14/2024 at 0937 hours. And he advises the patient is on Eliquis . I suspect acute subarachnoid hemorrhage in the left lower posterior fossa. Electronically Signed   By: VEAR Hurst M.D.   On: 03/14/2024 09:53   Result Date: 03/14/2024 CLINICAL DATA:  89 year old female status post fall in closet. Struck head. Hypertensive, 210 systolic. EXAM: CT HEAD WITHOUT CONTRAST TECHNIQUE: Contiguous axial images were obtained from the base of the skull through the vertex without intravenous contrast. RADIATION DOSE REDUCTION: This exam was performed according to the departmental dose-optimization program which includes automated exposure control, adjustment of the mA and/or kV according to patient size and/or use of iterative reconstruction technique. COMPARISON:  Head CT 02/04/2023. FINDINGS: Brain: New since last year hyperdense extra-axial space-occupying mass at the left ventral posterior fossa, series 3, image 8). This involves the left pre medullary cistern. This involves an area of about 2 cm (sagittal image 37). Not visible last year. Additionally, no regional mass effect. And superimposed supratentorial hyperdense para falcine subdural hematoma, up to 3 mm in thickness. No IVH or ventriculomegaly. Stable cerebral volume since last year. Advanced chronic cerebral white matter disease. Chronic heterogeneity in the deep gray nuclei compatible with small vessel disease. No cortically based acute infarct identified. Vascular: Calcified atherosclerosis at the skull base. No suspicious intracranial vascular  hyperdensity. Skull: Stable and intact. Sinuses/Orbits: Resolved paranasal sinus mucosal thickening last year. Visualized paranasal sinuses and mastoids are well aerated. Other: Chronic right forehead  scalp soft tissue scarring. No acute orbit or scalp soft tissue injury identified. Traumatic Brain Injury Risk Stratification Skull Fracture: No - Low/mBIG 1 Subdural Hematoma (SDH): <43mm - mBIG 1 Subarachnoid Hemorrhage [facility]): Probable left posterior fossa subarachnoid hemorrhage. Epidural Hematoma (EDH): No - Low/mBIG 1 Cerebral contusion, intra-axial, intraparenchymal Hemorrhage (IPH): No Intraventricular Hemorrhage (IVH): No - Low/mBIG 1 Midline Shift > 1mm or Edema/effacement of sulci/vents: No - Low/mBIG 1 ---------------------------------------------------- IMPRESSION: 1. Less than 4 mm para falcine subdural hematoma. And additionally, 2 cm area of hyperdense hemorrhage versus mass in the left lower posterior fossa, left pre medullary cistern. 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 2. No intracranial mass effect or midline shift. 3. Stable non contrast CT appearance of chronic small vessel disease. Electronically Signed: By: VEAR Hurst M.D. On: 03/14/2024 09:33   DG Chest Portable 1 View Result Date: 03/14/2024 CLINICAL DATA:  89 year old female status post fall in closet. Struck head. Hypertensive, 210 systolic. On Eliquis . EXAM: PORTABLE CHEST 1 VIEW COMPARISON:  Portable chest 11/20/2023 and earlier. FINDINGS: Portable AP semi upright view at 0854 hours. Chronic cardiomegaly, mild elevated left hemidiaphragm, left chest cardiac pacemaker. Stable lung volumes, mediastinal contours. Visualized tracheal air column is within normal limits. Allowing for portable technique the lungs are clear. No pneumothorax or pleural effusion. Advanced bilateral shoulder degeneration. No acute osseous abnormality identified. Paucity of  bowel gas. IMPRESSION: 1. No acute cardiopulmonary abnormality or acute traumatic injury identified. 2. Chronic cardiomegaly, pacemaker. Electronically Signed   By: VEAR Hurst M.D.   On: 03/14/2024 09:52   DG Pelvis 1-2 Views Result Date: 03/14/2024 CLINICAL DATA:  89 year old female status post fall in closet. Struck head. Hypertensive, 210 systolic. EXAM: PELVIS - 1-2 VIEW COMPARISON:  CT abdomen 11/25/2023. Intraoperative right hip images 12423. FINDINGS: Portable AP supine view at 0856 hours. Bipolar type right hip arthroplasty partially visible, grossly stable. Bone mineralization is within normal limits for age. Left femoral head degenerated, normally located. No acute pelvis fracture identified. Grossly intact proximal femurs. Nonobstructed bowel-gas pattern. Partially visible advanced lumbar disc and endplate degeneration. IMPRESSION: No acute fracture or dislocation identified about the pelvis. Electronically Signed   By: VEAR Hurst M.D.   On: 03/14/2024 09:41   CT Cervical Spine Wo Contrast Result Date: 03/14/2024 CLINICAL DATA:  89 year old female status post fall in closet. Struck head. Hypertensive, 210 systolic. EXAM: CT CERVICAL SPINE WITHOUT CONTRAST TECHNIQUE: Multidetector CT imaging of the cervical spine was performed without intravenous contrast. Multiplanar CT image reconstructions were also generated. RADIATION DOSE REDUCTION: This exam was performed according to the departmental dose-optimization program which includes automated exposure control, adjustment of the mA and/or kV according to patient size and/or use of iterative reconstruction technique. COMPARISON:  Head CT today.  Cervical spine CT 12/01/2022. FINDINGS: Alignment: Stable straightening of cervical lordosis. Chronic cirrhosis or assay ankylosis. Bilateral posterior element alignment is within normal limits. Skull base and vertebrae: Visualized skull base is intact. No atlanto-occipital dissociation. C1 and C2 are chronically  degenerated but appear intact and aligned. Diffuse idiopathic skeletal hyperostosis (DISH). No acute osseous abnormality identified. Soft tissues and spinal canal: No prevertebral fluid or swelling. No visible canal hematoma. However, globular hyperdense hemorrhage again suspected in the left posterior fossa just above the cisterna magna (series 6, image 19). This is contiguous with the foramen of Luschka. Severe calcified cervical carotid atherosclerosis. Disc levels: Advanced cervical spine degeneration with Diffuse idiopathic skeletal hyperostosis (  DISH). Chronic cervicothoracic junction ankylosis. Bulky endplate osteophytes elsewhere. Appearance is stable from the CT last year. Upper chest: Visible upper thoracic levels appear intact. Chronic left chest pacemaker leads. Negative lung apices. IMPRESSION: 1. No acute traumatic injury identified in the cervical spine. 2. Suspected acute subarachnoid space hemorrhage redemonstrated just above the left cisterna magna, somewhat contiguous with the foramen of Luschka. See Head CT reported separately. 3. Advanced chronic cervical spine degeneration with Diffuse idiopathic skeletal hyperostosis (DISH) appears stable from last year. Electronically Signed   By: VEAR Hurst M.D.   On: 03/14/2024 09:36   CUP PACEART REMOTE DEVICE CHECK Result Date: 03/11/2024 PPM scheduled remote reviewed. Normal device function.  Presenting rhythm: AF-VP   (Known chronic AF. On OAC per chart) Next remote 91 days. AB, CVRS   Microbiology Recent Results (from the past 240 hours)  Culture, blood (Routine X 2) w Reflex to ID Panel     Status: None   Collection Time: 03/14/24  4:24 PM   Specimen: BLOOD  Result Value Ref Range Status   Specimen Description BLOOD BLOOD LEFT ARM  Final   Special Requests   Final    BOTTLES DRAWN AEROBIC AND ANAEROBIC Blood Culture adequate volume   Culture   Final    NO GROWTH 5 DAYS Performed at [facility], [address] [place], [place]  [address]    Report Status 03/19/2024 FINAL  Final  Culture, blood (Routine X 2) w Reflex to ID Panel     Status: None   Collection Time: 03/14/24  4:37 PM   Specimen: BLOOD  Result Value Ref Range Status   Specimen Description BLOOD BLOOD RIGHT ARM  Final   Special Requests   Final    BOTTLES DRAWN AEROBIC AND ANAEROBIC Blood Culture adequate volume   Culture   Final    NO GROWTH 5 DAYS Performed at [facility], [address] [place], [place] [address]    Report Status 03/19/2024 FINAL  Final  Resp panel by RT-PCR (RSV, Flu A&B, Covid) Anterior Nasal Swab     Status: None   Collection Time: 03/18/24 10:47 AM   Specimen: Anterior Nasal Swab  Result Value Ref Range Status   SARS Coronavirus 2 by RT PCR NEGATIVE NEGATIVE Final   Influenza A by PCR NEGATIVE NEGATIVE Final   Influenza B by PCR NEGATIVE NEGATIVE Final    Comment: (NOTE) The Xpert Xpress SARS-CoV-2/FLU/RSV plus assay is intended as an aid in the diagnosis of influenza from Nasopharyngeal swab specimens and should not be used as a sole basis for treatment. Nasal washings and aspirates are unacceptable for Xpert Xpress SARS-CoV-2/FLU/RSV testing.  Fact Sheet for Patients: BloggerCourse.com  Fact Sheet for Healthcare Providers: SeriousBroker.it  This test is not yet approved or cleared by the [place]  FDA and has been authorized for detection and/or diagnosis of SARS-CoV-2 by FDA under an Emergency Use Authorization (EUA). This EUA will remain in effect (meaning this test can be used) for the duration of the COVID-19 declaration under Section 564(b)(1) of the Act, 21 U.S.C. section 360bbb-3(b)(1), unless the authorization is terminated or revoked.     Resp Syncytial Virus by PCR NEGATIVE NEGATIVE Final    Comment: (NOTE) Fact Sheet for Patients: BloggerCourse.com  Fact Sheet for Healthcare  Providers: SeriousBroker.it  This test is not yet approved or cleared by the [place]  FDA and has been authorized for detection and/or diagnosis of SARS-CoV-2 by FDA under an Emergency Use Authorization (  EUA). This EUA will remain in effect (meaning this test can be used) for the duration of the COVID-19 declaration under Section 564(b)(1) of the Act, 21 U.S.C. section 360bbb-3(b)(1), unless the authorization is terminated or revoked.  Performed at [facility], [address] [place], [place] [address]   MRSA Next Gen by PCR, Nasal     Status: None   Collection Time: 03/18/24  3:14 PM   Specimen: Nasal Mucosa; Nasal Swab  Result Value Ref Range Status   MRSA by PCR Next Gen NOT DETECTED NOT DETECTED Final    Comment: (NOTE) The GeneXpert MRSA Assay (FDA approved for NASAL specimens only), is one component of a comprehensive MRSA colonization surveillance program. It is not intended to diagnose MRSA infection nor to guide or monitor treatment for MRSA infections. Test performance is not FDA approved in patients less than 33 years old. Performed at [facility], [address] [place], [place] [address]     Lab Basic Metabolic Panel: Recent Labs  Lab 03/14/24 [id number] 03/15/24 0342 03/16/24 0358 03/18/24 1047 03/19/24 0552  NA 130* 131* 131* 125* 127*  K 5.4* 4.3 4.1 4.5 4.0  CL 97* 101 101 93* 96*  CO2 23 24 23 22  20*  GLUCOSE 130* 108* 119* 153* 184*  BUN 11 13 12 10 9   CREATININE 0.87 0.95 0.84 0.55 0.51  CALCIUM  9.9 9.5 9.7 10.1 10.2  MG  --   --   --   --  1.7   Liver Function Tests: Recent Labs  Lab 03/14/24 0855 03/18/24 1047  AST 30 29  ALT 24 23  ALKPHOS 83 77  BILITOT 1.2 2.1*  PROT 6.6 7.2  ALBUMIN 3.8 3.9   No results for input(s): LIPASE, AMYLASE in the last 168 hours. No results for input(s): AMMONIA in the last 168 hours. CBC: Recent Labs  Lab 03/14/24 0855 03/16/24 0358 03/18/24 1047  03/19/24 0552  WBC 9.6 9.5 13.5* 20.5*  NEUTROABS  --   --  11.4*  --   HGB 13.5 13.3 14.2 14.0  HCT 41.5 40.1 41.6 42.1  MCV 92.6 91.3 88.9 91.9  PLT 267 236 271 251   Cardiac Enzymes: No results for input(s): CKTOTAL, CKMB, CKMBINDEX, TROPONINI in the last 168 hours. Sepsis Labs: Recent Labs  Lab 03/14/24 0855 03/16/24 0358 03/18/24 1047 03/19/24 0552 03/19/24 0917 03/19/24 1110  PROCALCITON  --   --   --   --  <0.10  --   WBC 9.6 9.5 13.5* 20.5*  --   --   LATICACIDVEN  --   --   --   --   --  2.5*    Procedures/Operations  As per EMR   Donnice SAUNDERS Shadell Brenn 31-Mar-2024, 10:32 AM

## 2024-03-22 NOTE — Progress Notes (Signed)
 Family at bedside. Patient TOD 02:29. Patient in PEA, pacemaker spikes showing on rhythm strip. No heart sounds or lung sounds auscultated. Second nurse verify with Quintin Barns.

## 2024-03-22 DEATH — deceased

## 2024-03-25 ENCOUNTER — Encounter

## 2024-04-07 ENCOUNTER — Other Ambulatory Visit

## 2024-04-14 NOTE — Progress Notes (Signed)
 Remote PPM Transmission

## 2024-06-10 ENCOUNTER — Encounter

## 2024-06-30 ENCOUNTER — Other Ambulatory Visit

## 2024-09-09 ENCOUNTER — Encounter

## 2024-12-09 ENCOUNTER — Encounter

## 2025-03-10 ENCOUNTER — Encounter
